# Patient Record
Sex: Female | Born: 1965 | Race: Black or African American | Hispanic: No | Marital: Single | Smoking: Never smoker
Health system: Southern US, Community
[De-identification: ages and names within clinical notes are randomized; demographics above are authoritative.]

## PROBLEM LIST (undated history)

## (undated) DIAGNOSIS — F419 Anxiety disorder, unspecified: Secondary | ICD-10-CM

## (undated) DIAGNOSIS — I10 Essential (primary) hypertension: Secondary | ICD-10-CM

## (undated) DIAGNOSIS — F32A Depression, unspecified: Secondary | ICD-10-CM

## (undated) DIAGNOSIS — E669 Obesity, unspecified: Secondary | ICD-10-CM

## (undated) DIAGNOSIS — D649 Anemia, unspecified: Secondary | ICD-10-CM

## (undated) DIAGNOSIS — Z923 Personal history of irradiation: Secondary | ICD-10-CM

## (undated) DIAGNOSIS — C541 Malignant neoplasm of endometrium: Secondary | ICD-10-CM

## (undated) DIAGNOSIS — E785 Hyperlipidemia, unspecified: Secondary | ICD-10-CM

## (undated) DIAGNOSIS — Q899 Congenital malformation, unspecified: Secondary | ICD-10-CM

## (undated) DIAGNOSIS — R609 Edema, unspecified: Secondary | ICD-10-CM

## (undated) DIAGNOSIS — M1990 Unspecified osteoarthritis, unspecified site: Secondary | ICD-10-CM

## (undated) DIAGNOSIS — F329 Major depressive disorder, single episode, unspecified: Secondary | ICD-10-CM

## (undated) DIAGNOSIS — Z9289 Personal history of other medical treatment: Secondary | ICD-10-CM

## (undated) HISTORY — DX: Personal history of irradiation: Z92.3

## (undated) HISTORY — DX: Congenital malformation, unspecified: Q89.9

## (undated) HISTORY — DX: Depression, unspecified: F32.A

## (undated) HISTORY — DX: Personal history of other medical treatment: Z92.89

## (undated) HISTORY — DX: Malignant neoplasm of endometrium: C54.1

## (undated) HISTORY — DX: Anemia, unspecified: D64.9

## (undated) HISTORY — PX: HIATAL HERNIA REPAIR: SHX195

## (undated) HISTORY — DX: Hyperlipidemia, unspecified: E78.5

## (undated) HISTORY — DX: Major depressive disorder, single episode, unspecified: F32.9

## (undated) HISTORY — DX: Obesity, unspecified: E66.9

---

## 1998-04-28 ENCOUNTER — Emergency Department (HOSPITAL_COMMUNITY): Admission: EM | Admit: 1998-04-28 | Discharge: 1998-04-28 | Payer: Self-pay | Admitting: Emergency Medicine

## 1998-10-05 ENCOUNTER — Ambulatory Visit (HOSPITAL_COMMUNITY): Admission: RE | Admit: 1998-10-05 | Discharge: 1998-10-05 | Payer: Self-pay | Admitting: *Deleted

## 1998-10-14 ENCOUNTER — Ambulatory Visit (HOSPITAL_COMMUNITY): Admission: RE | Admit: 1998-10-14 | Discharge: 1998-10-14 | Payer: Self-pay | Admitting: Nephrology

## 1998-10-14 ENCOUNTER — Encounter: Payer: Self-pay | Admitting: Emergency Medicine

## 1998-10-14 ENCOUNTER — Emergency Department (HOSPITAL_COMMUNITY): Admission: EM | Admit: 1998-10-14 | Discharge: 1998-10-14 | Payer: Self-pay | Admitting: Emergency Medicine

## 1998-10-14 ENCOUNTER — Encounter: Payer: Self-pay | Admitting: *Deleted

## 1998-11-28 ENCOUNTER — Emergency Department (HOSPITAL_COMMUNITY): Admission: EM | Admit: 1998-11-28 | Discharge: 1998-11-28 | Payer: Self-pay | Admitting: Emergency Medicine

## 1999-02-07 ENCOUNTER — Encounter: Admission: RE | Admit: 1999-02-07 | Discharge: 1999-02-07 | Payer: Self-pay | Admitting: Sports Medicine

## 1999-04-01 ENCOUNTER — Emergency Department (HOSPITAL_COMMUNITY): Admission: EM | Admit: 1999-04-01 | Discharge: 1999-04-01 | Payer: Self-pay | Admitting: Emergency Medicine

## 1999-06-16 ENCOUNTER — Encounter: Admission: RE | Admit: 1999-06-16 | Discharge: 1999-06-16 | Payer: Self-pay | Admitting: Family Medicine

## 1999-08-01 ENCOUNTER — Encounter: Admission: RE | Admit: 1999-08-01 | Discharge: 1999-08-01 | Payer: Self-pay | Admitting: Family Medicine

## 2000-10-29 ENCOUNTER — Emergency Department (HOSPITAL_COMMUNITY): Admission: EM | Admit: 2000-10-29 | Discharge: 2000-10-29 | Payer: Self-pay | Admitting: Internal Medicine

## 2001-01-29 ENCOUNTER — Encounter: Admission: RE | Admit: 2001-01-29 | Discharge: 2001-01-29 | Payer: Self-pay | Admitting: Family Medicine

## 2001-03-14 ENCOUNTER — Ambulatory Visit (HOSPITAL_COMMUNITY): Admission: RE | Admit: 2001-03-14 | Discharge: 2001-03-14 | Payer: Self-pay | Admitting: Sports Medicine

## 2001-08-10 ENCOUNTER — Emergency Department (HOSPITAL_COMMUNITY): Admission: EM | Admit: 2001-08-10 | Discharge: 2001-08-10 | Payer: Self-pay | Admitting: Emergency Medicine

## 2002-05-07 ENCOUNTER — Encounter: Admission: RE | Admit: 2002-05-07 | Discharge: 2002-05-07 | Payer: Self-pay | Admitting: Family Medicine

## 2002-05-08 ENCOUNTER — Emergency Department (HOSPITAL_COMMUNITY): Admission: EM | Admit: 2002-05-08 | Discharge: 2002-05-08 | Payer: Self-pay | Admitting: Emergency Medicine

## 2002-05-11 ENCOUNTER — Observation Stay (HOSPITAL_COMMUNITY): Admission: RE | Admit: 2002-05-11 | Discharge: 2002-05-12 | Payer: Self-pay | Admitting: General Surgery

## 2002-05-23 ENCOUNTER — Emergency Department (HOSPITAL_COMMUNITY): Admission: EM | Admit: 2002-05-23 | Discharge: 2002-05-24 | Payer: Self-pay | Admitting: Emergency Medicine

## 2002-05-27 ENCOUNTER — Encounter: Admission: RE | Admit: 2002-05-27 | Discharge: 2002-05-27 | Payer: Self-pay | Admitting: Family Medicine

## 2003-01-04 ENCOUNTER — Emergency Department (HOSPITAL_COMMUNITY): Admission: EM | Admit: 2003-01-04 | Discharge: 2003-01-04 | Payer: Self-pay | Admitting: Emergency Medicine

## 2003-05-06 ENCOUNTER — Encounter: Admission: RE | Admit: 2003-05-06 | Discharge: 2003-05-06 | Payer: Self-pay | Admitting: Family Medicine

## 2003-05-27 ENCOUNTER — Encounter: Admission: RE | Admit: 2003-05-27 | Discharge: 2003-05-27 | Payer: Self-pay | Admitting: Family Medicine

## 2003-06-10 ENCOUNTER — Ambulatory Visit (HOSPITAL_COMMUNITY): Admission: RE | Admit: 2003-06-10 | Discharge: 2003-06-10 | Payer: Self-pay | Admitting: Orthopedic Surgery

## 2003-06-10 ENCOUNTER — Encounter: Payer: Self-pay | Admitting: Orthopedic Surgery

## 2003-09-24 ENCOUNTER — Encounter: Admission: RE | Admit: 2003-09-24 | Discharge: 2003-09-24 | Payer: Self-pay | Admitting: Family Medicine

## 2004-08-22 ENCOUNTER — Ambulatory Visit: Payer: Self-pay | Admitting: Sports Medicine

## 2004-08-25 ENCOUNTER — Encounter: Admission: RE | Admit: 2004-08-25 | Discharge: 2004-08-25 | Payer: Self-pay | Admitting: Sports Medicine

## 2004-10-24 ENCOUNTER — Ambulatory Visit: Payer: Self-pay | Admitting: Sports Medicine

## 2005-03-27 ENCOUNTER — Ambulatory Visit: Payer: Self-pay | Admitting: Family Medicine

## 2005-05-08 ENCOUNTER — Ambulatory Visit: Payer: Self-pay | Admitting: Sports Medicine

## 2006-02-04 ENCOUNTER — Ambulatory Visit: Payer: Self-pay | Admitting: Family Medicine

## 2006-05-07 IMAGING — US US TRANSVAGINAL NON-OB
1 series · 14 of 25 positions shown · non-contrast
Comparison: none

CLINICAL DATA: Pelvic pain.
 ULTRASOUND OF THE PELVIS
 Transabdominal and Transabdominal and transvaginal ultrasound of the pelvis were performed.  The uterus is normal in size measuring   8.4 cm sagittally with a depth of 3.1 cm and width of 6.1 cm.  There are a few nabothian cysts present.  The endometrium is prominent and inhomogeneous measuring approximately 15.8 mm in thickness.  Follow up ultrasound is recommended in two months, and if this inhomogeneity and thickening persist, then sonohysterogram or endometrial biopsy would be recommended.  The right ovary is normal in size with follicles.  The left ovary is obscured by bowel gas and cannot be assessed.  No free fluid is seen. 
 IMPRESSION
 1.  Thickened and inhomogeneous endometrium.  Suggest follow up ultrasound in 1-2 months, as noted above.
 2.  Right ovarian follicles.  Left ovary is obscured by bowel gas.

[Series 1: unknown · 0.26mm/px · 14 of 52 slices shown]
[im 1/52]
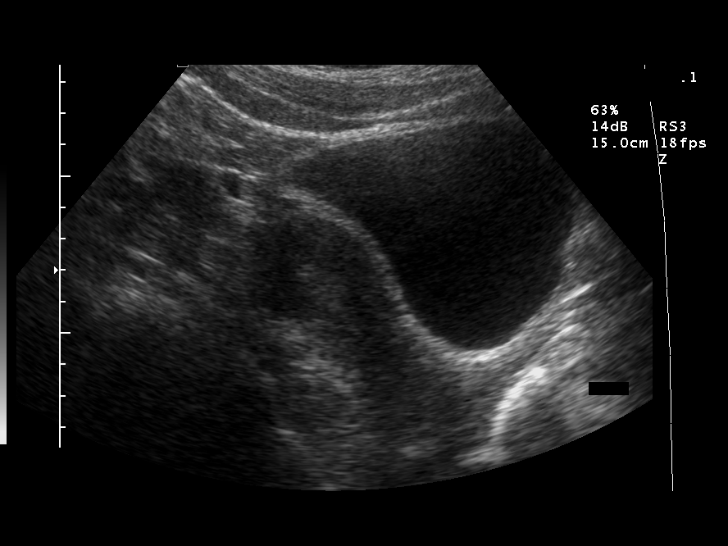
[im 5/52]
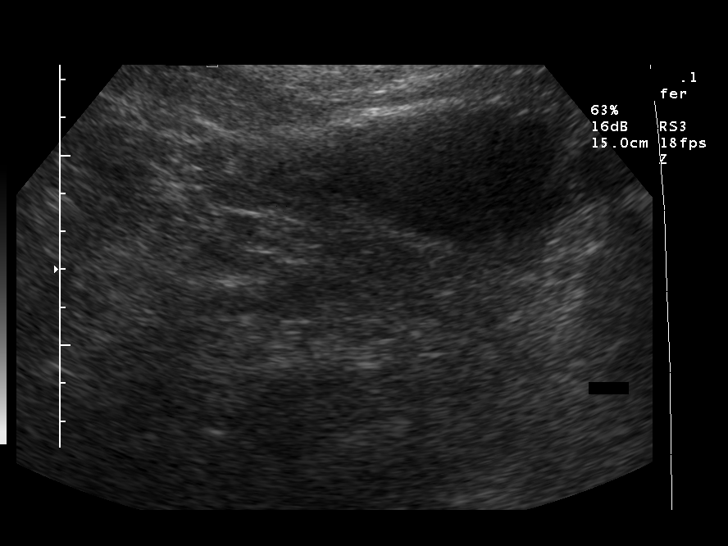
[im 9/52]
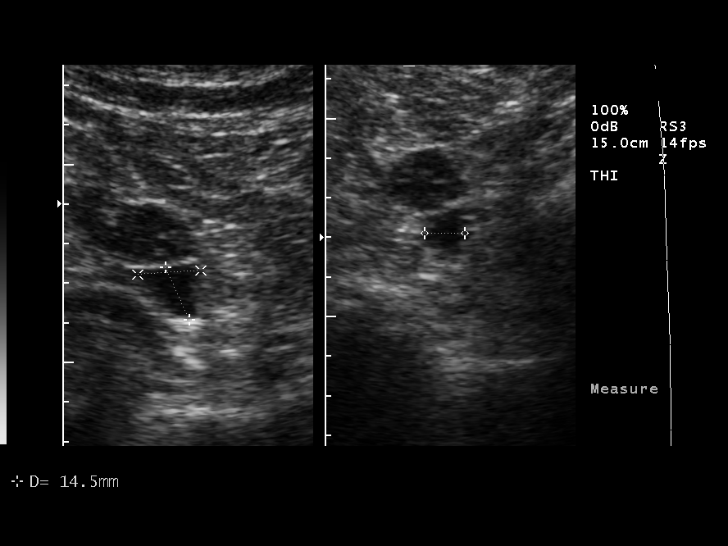
[im 13/52]
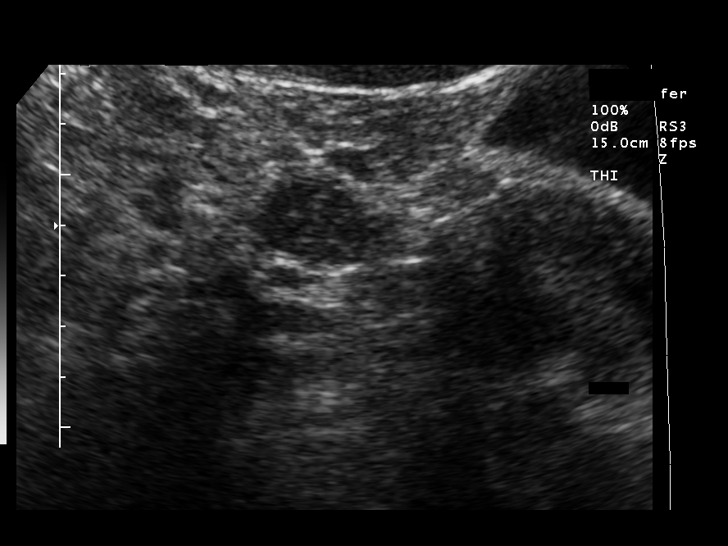
[im 18/52]
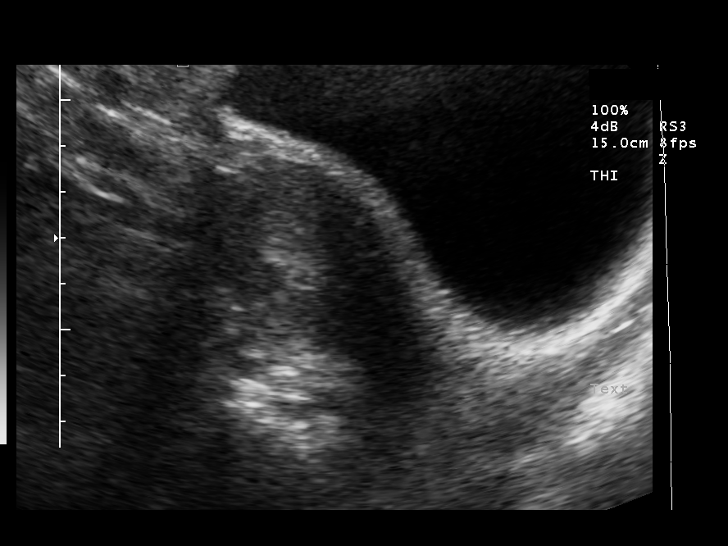
[im 20/52]
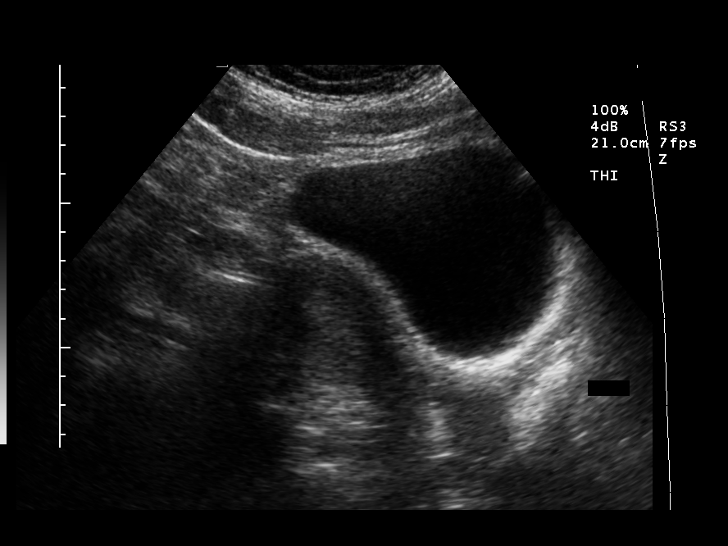
[im 24/52]
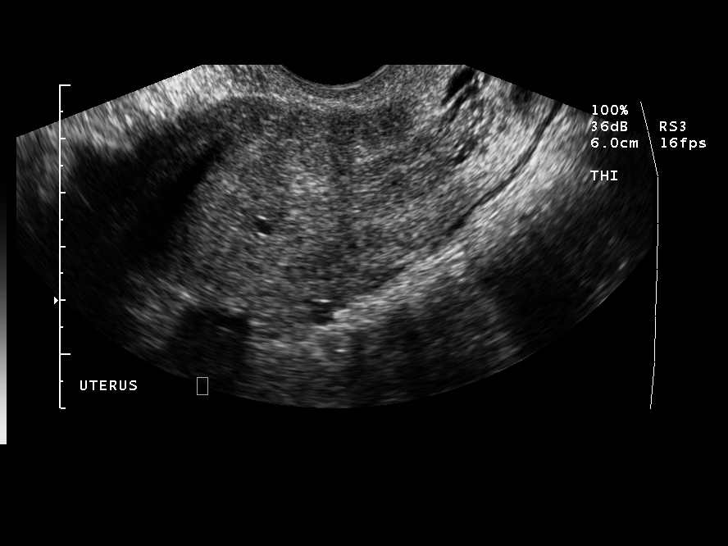
[im 28/52]
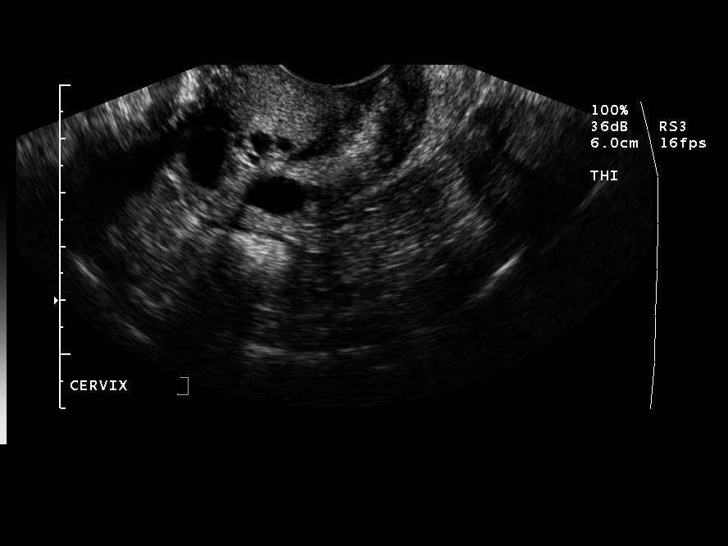
[im 32/52]
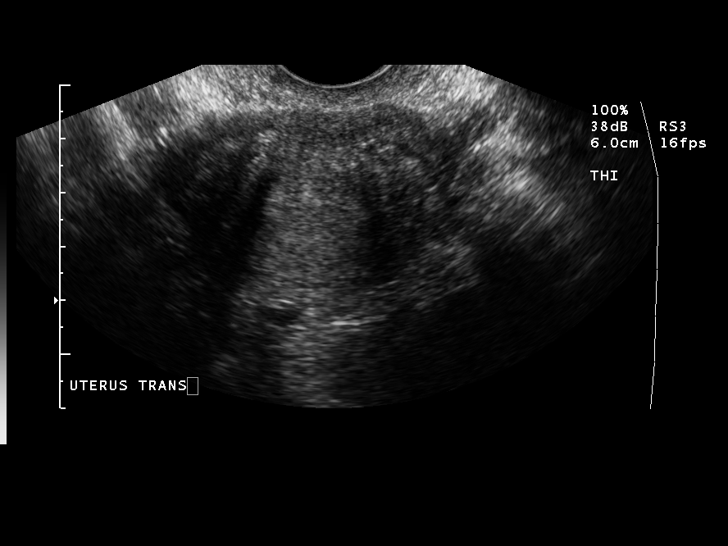
[im 35/52]
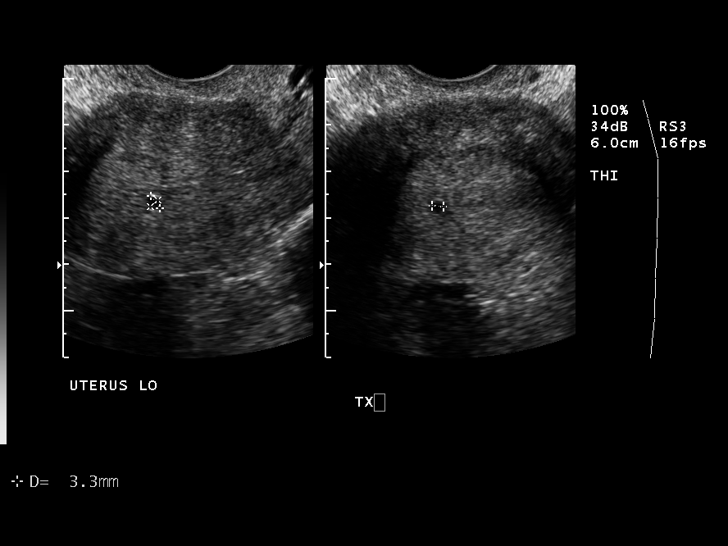
[im 39/52]
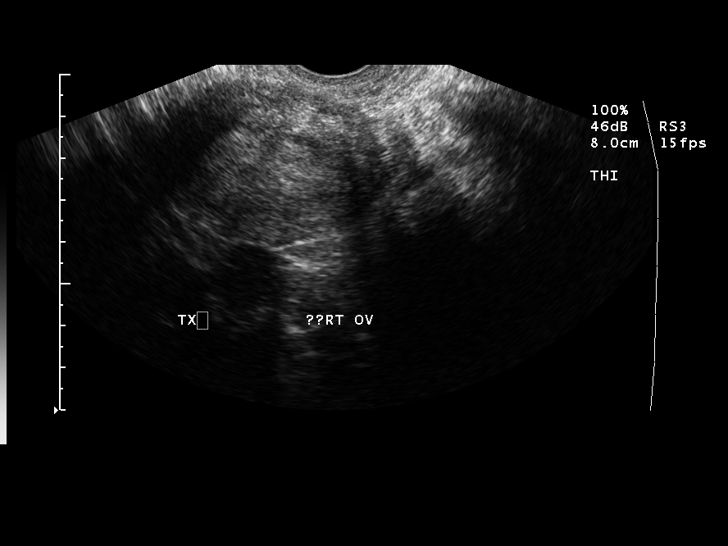
[im 43/52]
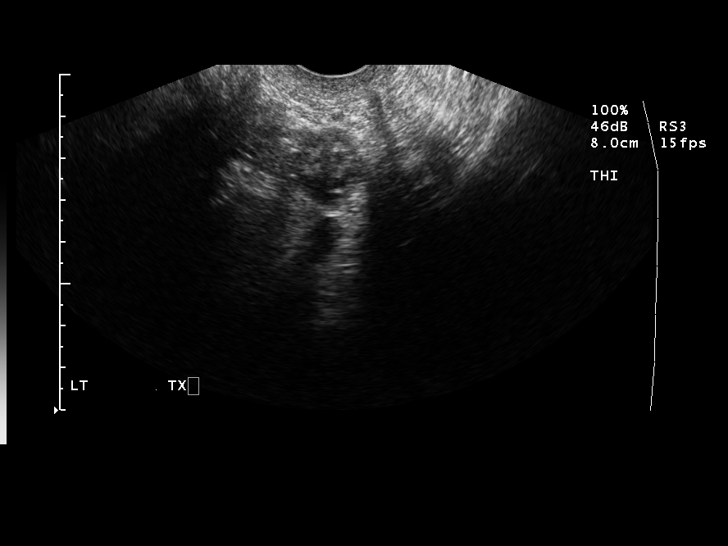
[im 47/52]
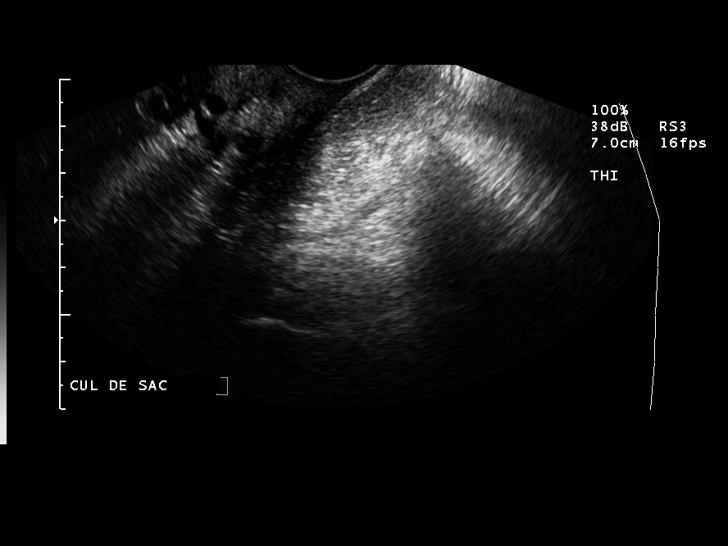
[im 52/52]
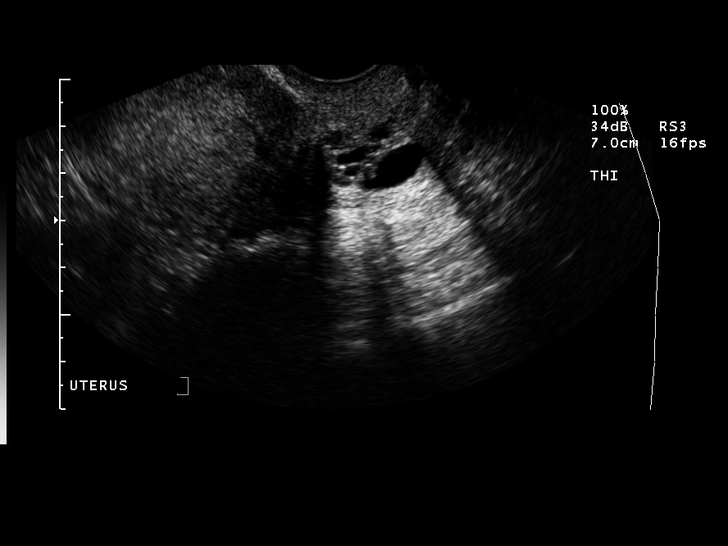

[14 of 25 positions shown; findings below may reference images not displayed]

## 2007-01-23 DIAGNOSIS — K219 Gastro-esophageal reflux disease without esophagitis: Secondary | ICD-10-CM | POA: Insufficient documentation

## 2007-01-23 DIAGNOSIS — M170 Bilateral primary osteoarthritis of knee: Secondary | ICD-10-CM

## 2007-01-23 DIAGNOSIS — M25562 Pain in left knee: Secondary | ICD-10-CM

## 2007-01-23 DIAGNOSIS — E669 Obesity, unspecified: Secondary | ICD-10-CM | POA: Insufficient documentation

## 2007-01-23 HISTORY — DX: Gastro-esophageal reflux disease without esophagitis: K21.9

## 2007-05-22 ENCOUNTER — Encounter (INDEPENDENT_AMBULATORY_CARE_PROVIDER_SITE_OTHER): Payer: Self-pay | Admitting: *Deleted

## 2007-05-28 ENCOUNTER — Telehealth (INDEPENDENT_AMBULATORY_CARE_PROVIDER_SITE_OTHER): Payer: Self-pay | Admitting: Family Medicine

## 2007-06-13 ENCOUNTER — Ambulatory Visit: Payer: Self-pay | Admitting: Family Medicine

## 2007-06-13 DIAGNOSIS — M545 Low back pain: Secondary | ICD-10-CM

## 2007-06-13 DIAGNOSIS — D500 Iron deficiency anemia secondary to blood loss (chronic): Secondary | ICD-10-CM | POA: Insufficient documentation

## 2007-06-25 ENCOUNTER — Telehealth: Payer: Self-pay | Admitting: *Deleted

## 2007-07-08 ENCOUNTER — Encounter: Payer: Self-pay | Admitting: Family Medicine

## 2007-07-08 ENCOUNTER — Ambulatory Visit: Payer: Self-pay | Admitting: Family Medicine

## 2007-07-08 ENCOUNTER — Telehealth (INDEPENDENT_AMBULATORY_CARE_PROVIDER_SITE_OTHER): Payer: Self-pay | Admitting: *Deleted

## 2007-07-08 DIAGNOSIS — H905 Unspecified sensorineural hearing loss: Secondary | ICD-10-CM

## 2007-07-09 ENCOUNTER — Encounter (INDEPENDENT_AMBULATORY_CARE_PROVIDER_SITE_OTHER): Payer: Self-pay | Admitting: Family Medicine

## 2007-07-16 ENCOUNTER — Ambulatory Visit: Payer: Self-pay | Admitting: Family Medicine

## 2007-07-18 ENCOUNTER — Ambulatory Visit: Payer: Self-pay | Admitting: Family Medicine

## 2007-09-02 ENCOUNTER — Telehealth (INDEPENDENT_AMBULATORY_CARE_PROVIDER_SITE_OTHER): Payer: Self-pay | Admitting: *Deleted

## 2007-10-22 ENCOUNTER — Telehealth (INDEPENDENT_AMBULATORY_CARE_PROVIDER_SITE_OTHER): Payer: Self-pay | Admitting: Family Medicine

## 2007-10-23 ENCOUNTER — Encounter (INDEPENDENT_AMBULATORY_CARE_PROVIDER_SITE_OTHER): Payer: Self-pay | Admitting: Family Medicine

## 2007-11-28 ENCOUNTER — Telehealth: Payer: Self-pay | Admitting: *Deleted

## 2007-12-22 ENCOUNTER — Emergency Department (HOSPITAL_COMMUNITY): Admission: EM | Admit: 2007-12-22 | Discharge: 2007-12-22 | Payer: Self-pay | Admitting: Emergency Medicine

## 2007-12-26 ENCOUNTER — Encounter: Admission: RE | Admit: 2007-12-26 | Discharge: 2007-12-26 | Payer: Self-pay | Admitting: Orthopedic Surgery

## 2008-02-13 ENCOUNTER — Telehealth: Payer: Self-pay | Admitting: *Deleted

## 2008-07-02 ENCOUNTER — Telehealth: Payer: Self-pay | Admitting: *Deleted

## 2009-07-28 ENCOUNTER — Emergency Department (HOSPITAL_COMMUNITY): Admission: EM | Admit: 2009-07-28 | Discharge: 2009-07-28 | Payer: Self-pay | Admitting: Emergency Medicine

## 2009-08-18 ENCOUNTER — Encounter: Payer: Self-pay | Admitting: Family Medicine

## 2009-08-18 ENCOUNTER — Ambulatory Visit: Payer: Self-pay | Admitting: Family Medicine

## 2009-08-18 DIAGNOSIS — L538 Other specified erythematous conditions: Secondary | ICD-10-CM | POA: Insufficient documentation

## 2009-08-18 LAB — CONVERTED CEMR LAB
AST: 10 units/L (ref 0–37)
Albumin: 3.7 g/dL (ref 3.5–5.2)
BUN: 14 mg/dL (ref 6–23)
Calcium: 8.5 mg/dL (ref 8.4–10.5)
Chloride: 104 meq/L (ref 96–112)
Glucose, Bld: 79 mg/dL (ref 70–99)
HDL: 50 mg/dL (ref >39)
Hemoglobin: 12.4 g/dL (ref 12.0–15.0)
Potassium: 4.2 meq/L (ref 3.5–5.3)
RBC: 5.07 M/uL (ref 3.87–5.11)
Saturation Ratios: 7 % — ABNORMAL LOW (ref 20–55)
TIBC: 433 ug/dL (ref 250–470)
TSH: 0.28 microintl units/mL — ABNORMAL LOW (ref 0.350–4.500)
WBC: 5.9 10*3/uL (ref 4.0–10.5)

## 2009-08-22 ENCOUNTER — Encounter: Payer: Self-pay | Admitting: Family Medicine

## 2009-08-26 ENCOUNTER — Telehealth: Payer: Self-pay | Admitting: Family Medicine

## 2009-08-29 ENCOUNTER — Telehealth (INDEPENDENT_AMBULATORY_CARE_PROVIDER_SITE_OTHER): Payer: Self-pay

## 2009-08-30 ENCOUNTER — Telehealth: Payer: Self-pay | Admitting: *Deleted

## 2009-09-02 IMAGING — CR DG KNEE COMPLETE 4+V*L*
4 series · 4 of 4 positions shown · non-contrast
Comparison: None.

CLINICAL DATA: Pain below the patella ? no known injury.
 LEFT KNEE ? 4 VIEWS ? 12/22/07:

[t knee ap left]
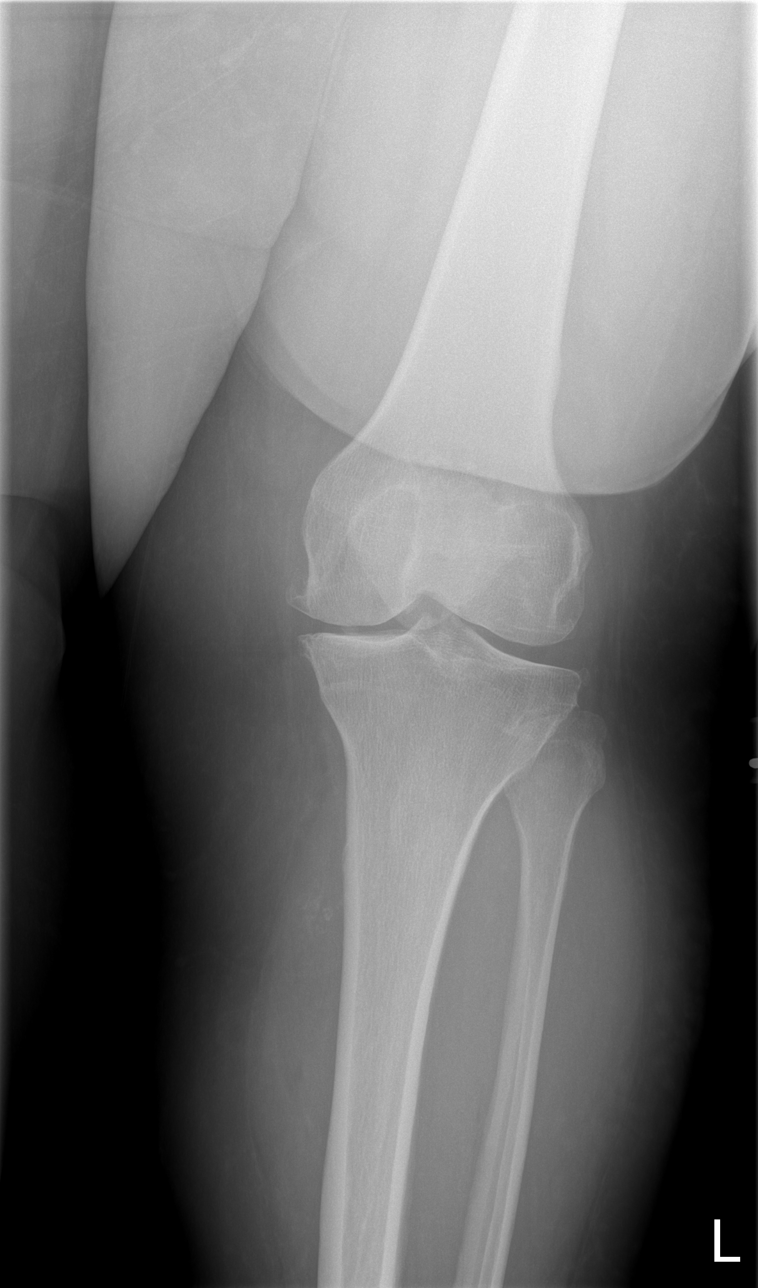

[t knee oblique left (1 of 2)]
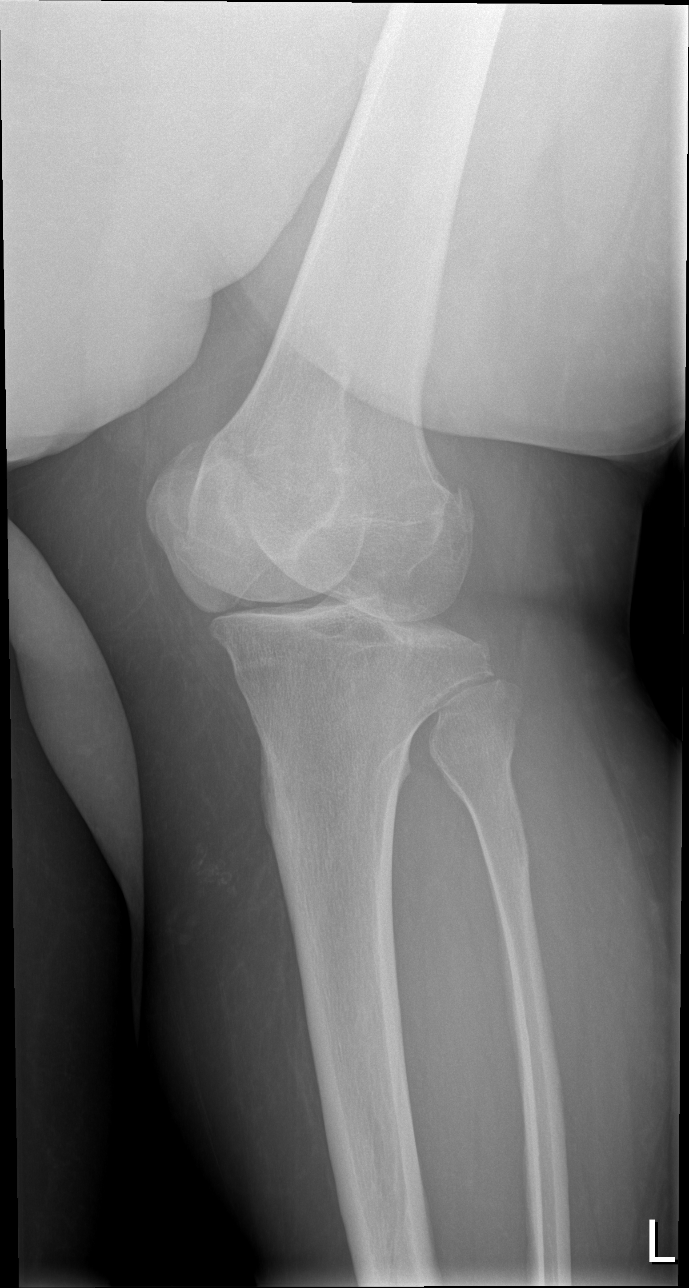

[t knee oblique left (2 of 2)]
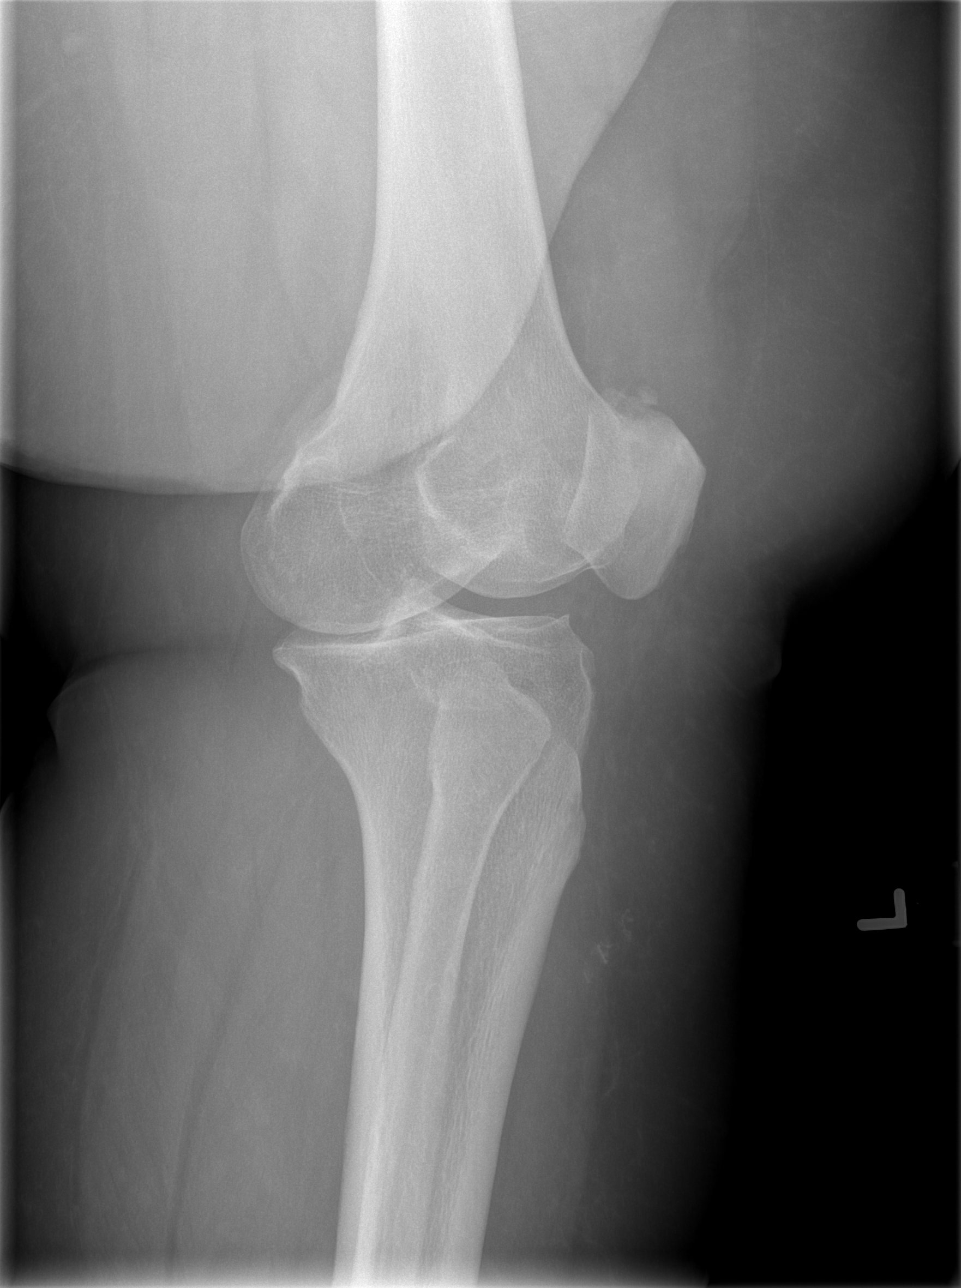

[t knee lat left]
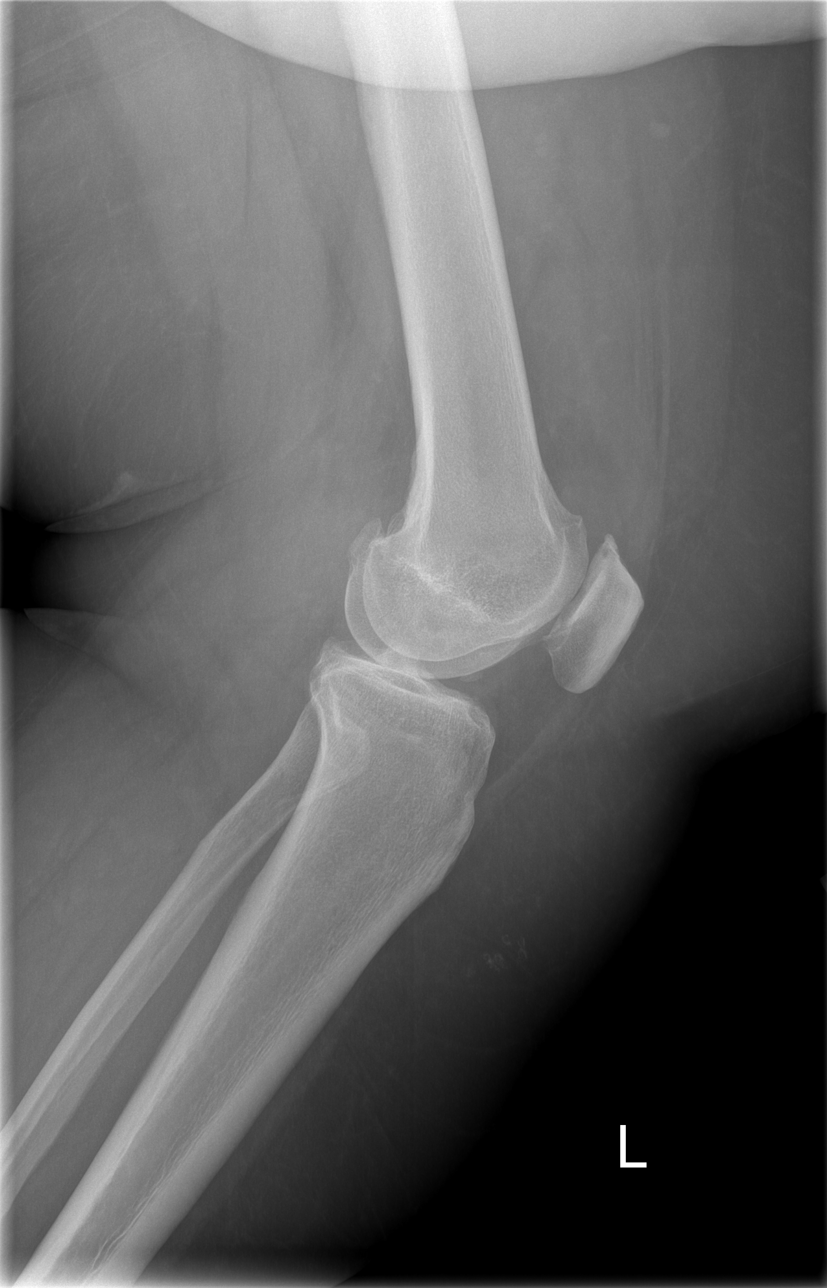

[4 of 4 positions shown; findings below may reference images not displayed]

FINDINGS: There are moderately advanced degenerative changes of the medial compartment and moderate degenerative changes of the patellofemoral compartment.  There is a possible small joint effusion but no acute findings of the bony structures.
IMPRESSION: Moderately advanced degenerative changes with possible small joint effusion ? no fracture or dislocation.

## 2009-09-02 IMAGING — CR DG FINGER THUMB 2+V*L*
3 series · 3 of 3 positions shown · non-contrast
Comparison: none

CLINICAL DATA: Left thumb pain. 
 LEFT THUMB ? 3 VIEW:

[x finger pa left]
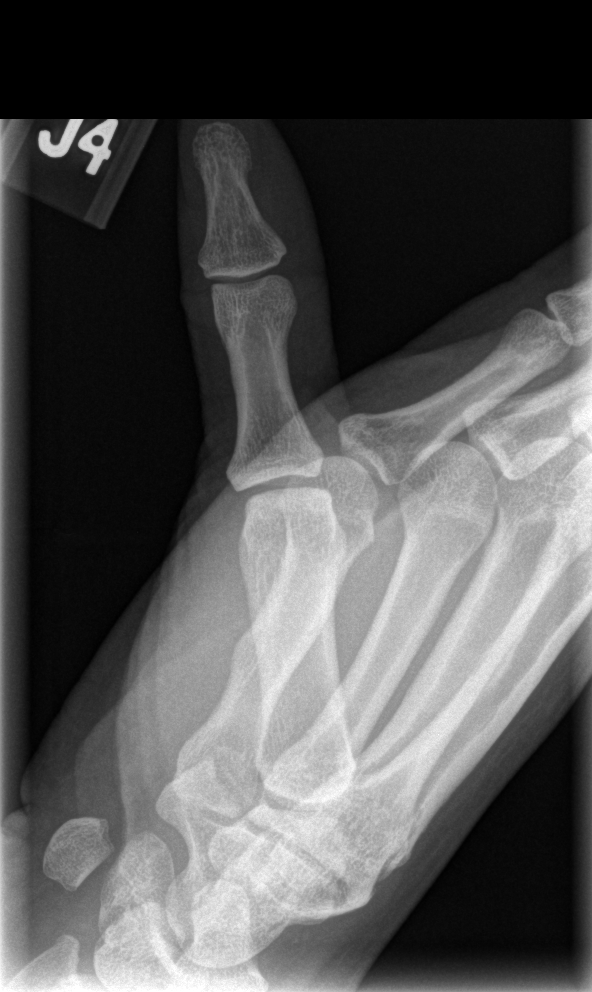

[x finger obl. left]
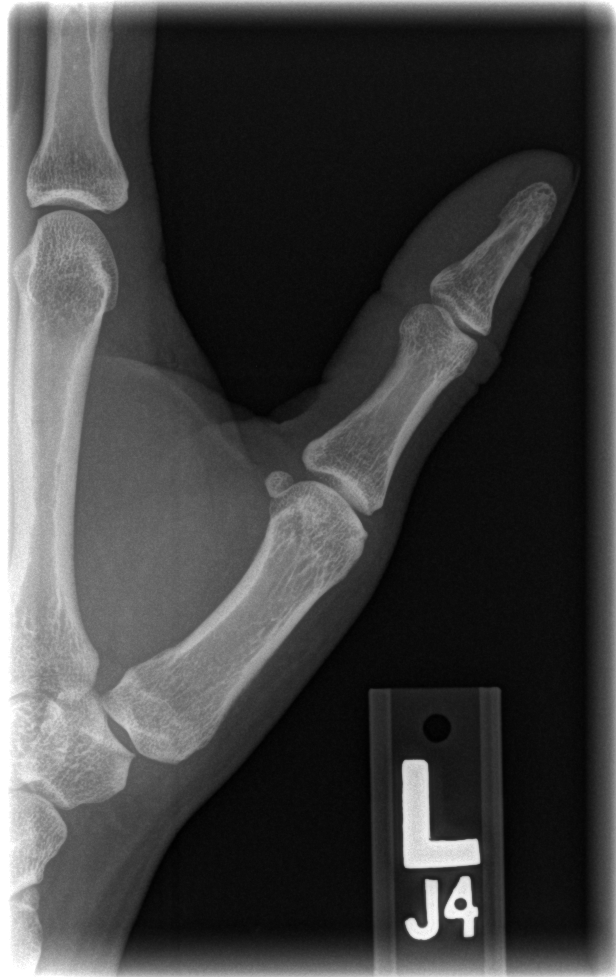

[x finger lateral left]
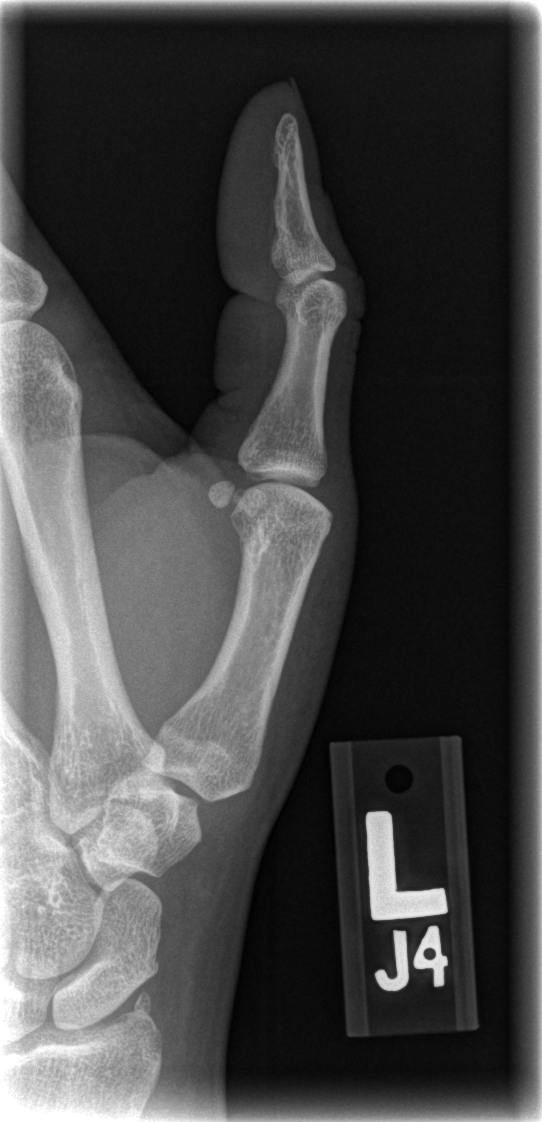

[3 of 3 positions shown; findings below may reference images not displayed]

FINDINGS: There is no evidence of fracture or dislocation.  There is no evidence of arthropathy or other focal bone abnormality.  Soft tissues are unremarkable.
IMPRESSION: Negative.

## 2009-09-06 ENCOUNTER — Telehealth (INDEPENDENT_AMBULATORY_CARE_PROVIDER_SITE_OTHER): Payer: Self-pay

## 2009-09-06 IMAGING — MR MR [PERSON_NAME] LOW JT W/O CM*L*
4 of 7 series · 19 of 40 positions shown · non-contrast
Comparison: None

CLINICAL DATA: Knee pain, tear.
MRI OF THE LEFT KNEE WITHOUT CONTRAST
TECHNIQUE: Multiplanar, multisequence MR imaging of the LEFT knee was performed
following the standard protocol.  No intravenous contrast was administered.

[Series 2: loc 3 plane · coronal · 8.0mm · 0.68mm/px · 3 of 6 slices shown]
[im 1/6]
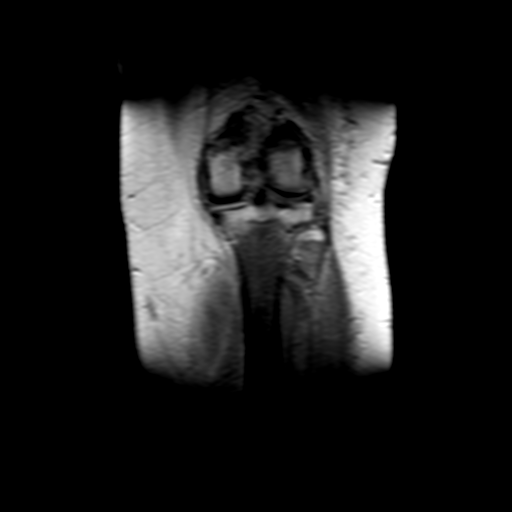
[im 3/6]
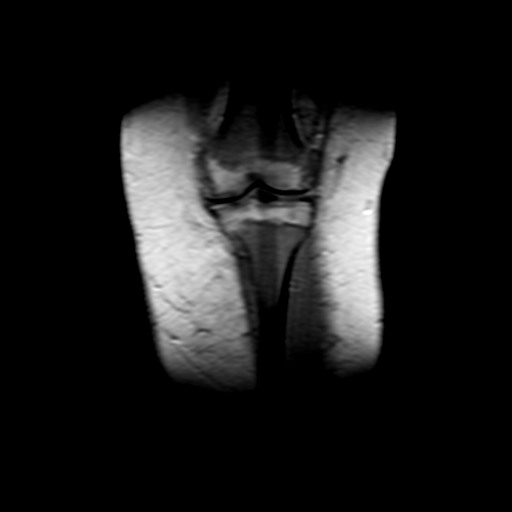
[im 6/6]
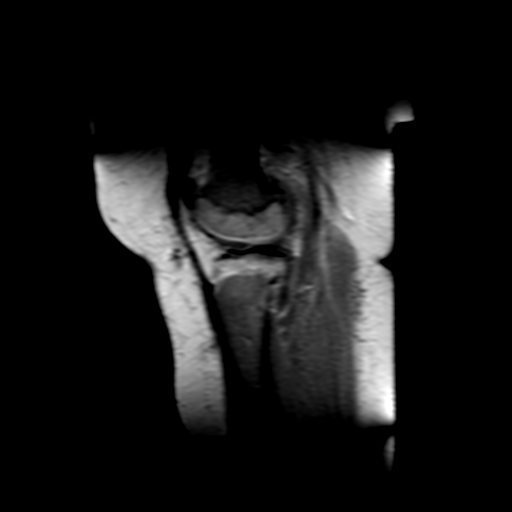

[Series 3: left pd_tse_fs_tra · axial · 4.0mm · 0.37mm/px · z∈[-7,+90]mm · 7 of 23 slices shown]
[im 1/23]
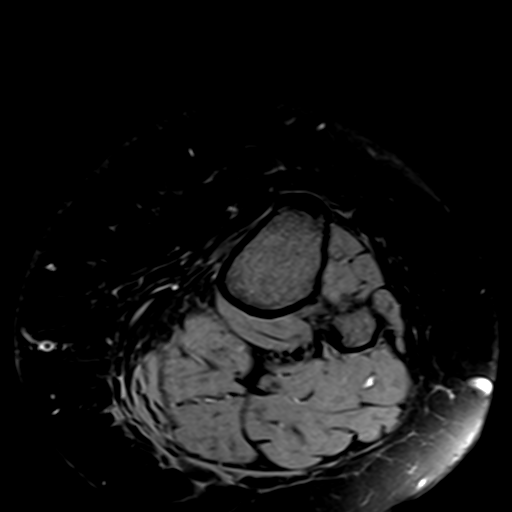
[im 4/23]
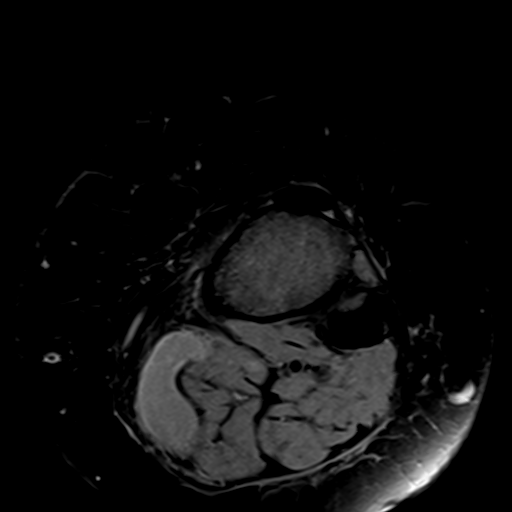
[im 8/23]
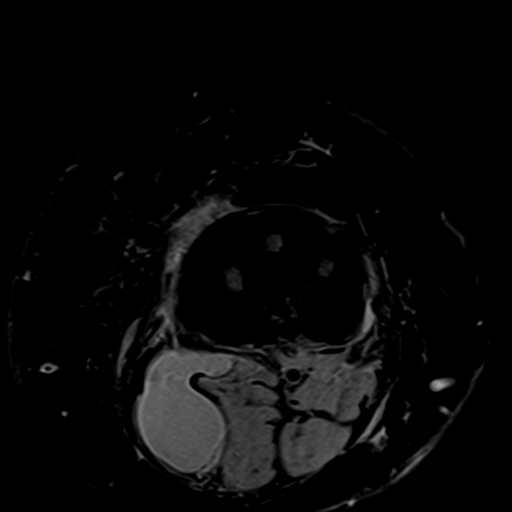
[im 12/23]
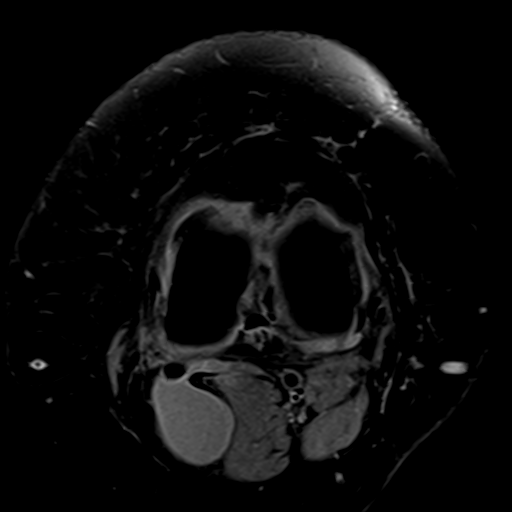
[im 15/23]
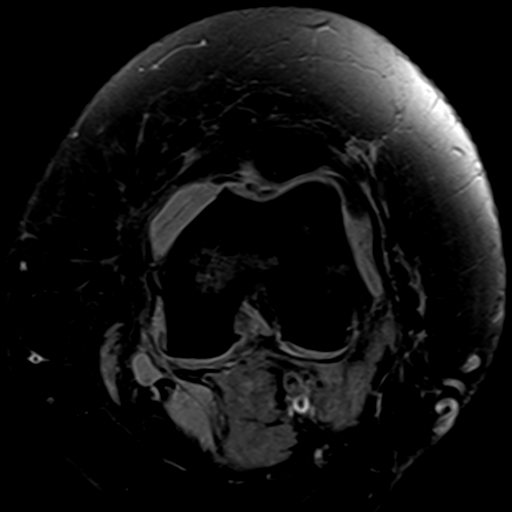
[im 19/23]
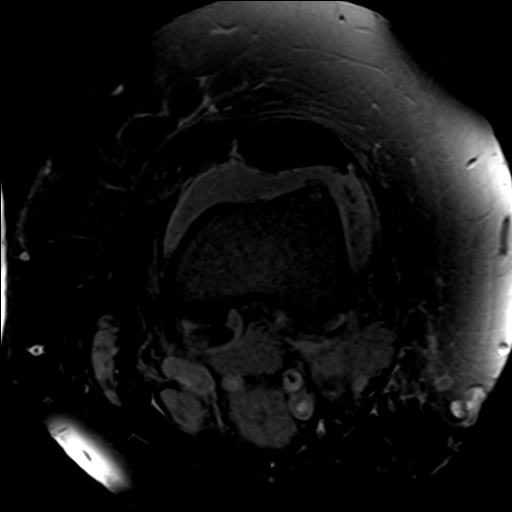
[im 23/23]
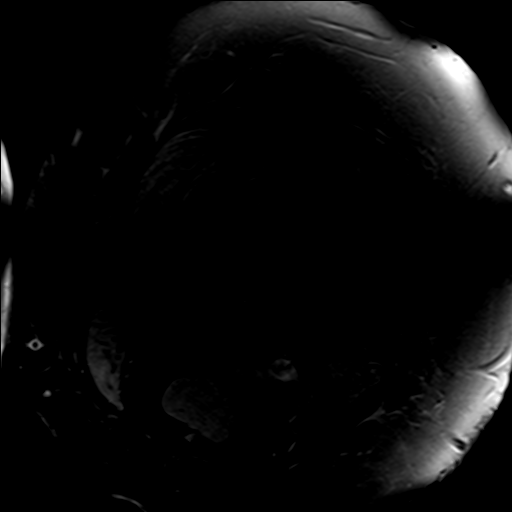

[Series 4: left pd_tse_fs_cor · coronal · 4.0mm · 0.37mm/px · 6 of 23 slices shown]
[im 1/23]
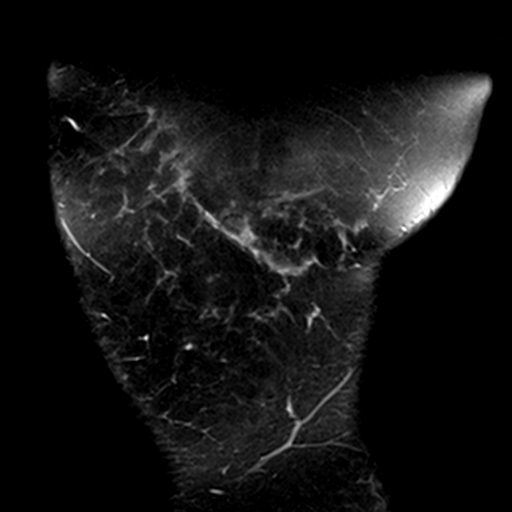
[im 5/23]
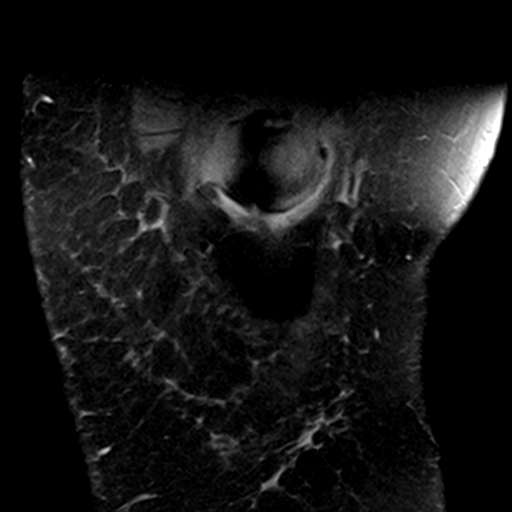
[im 9/23]
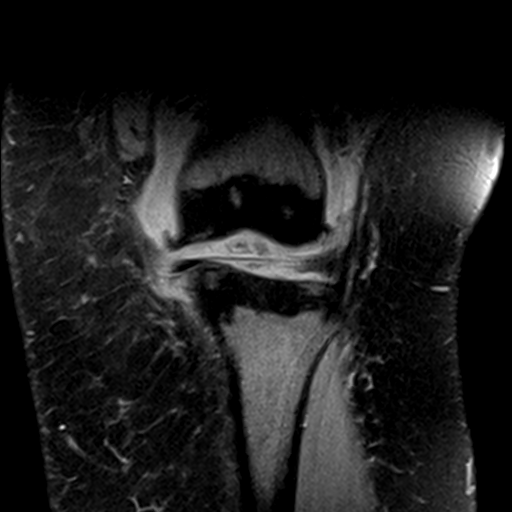
[im 14/23]
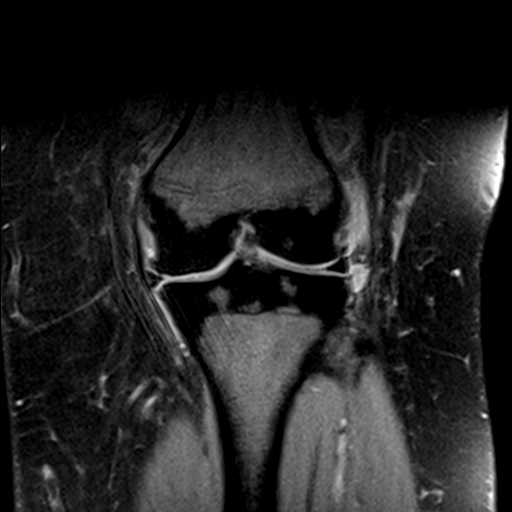
[im 18/23]
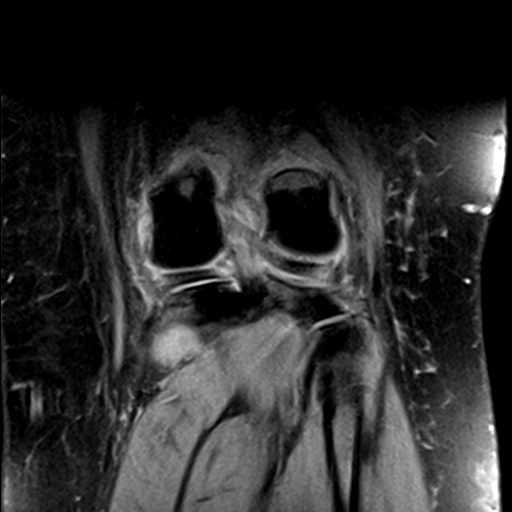
[im 23/23]
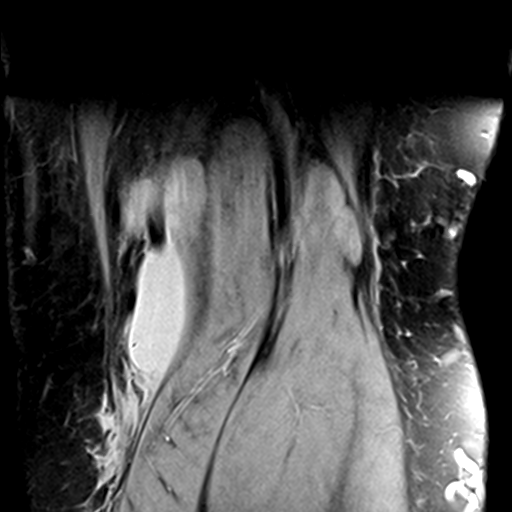

[Series 5: T2 fat-sat · coronal · 4.0mm · 0.37mm/px · 3 of 23 slices shown]
[im 5/23]
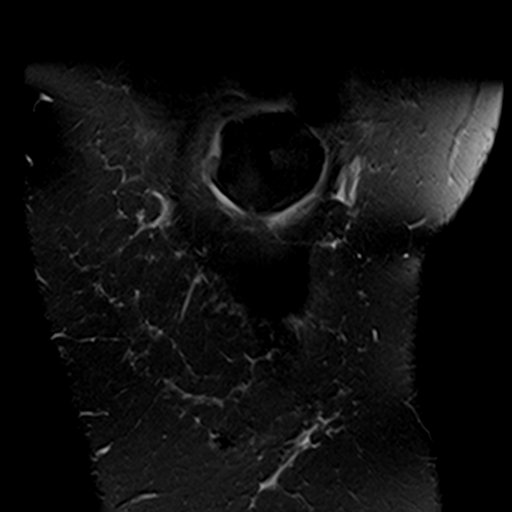
[im 14/23]
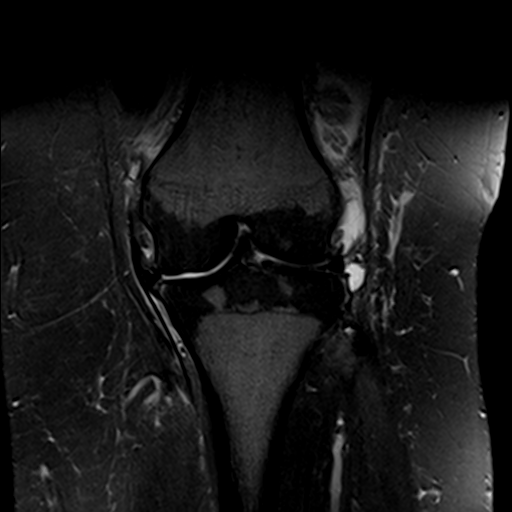
[im 23/23]
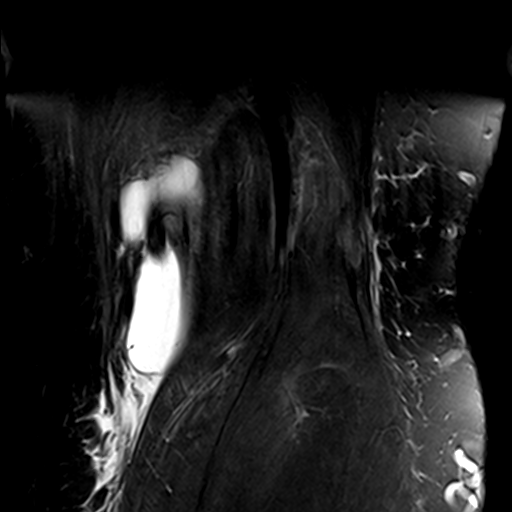

[19 of 40 positions shown; findings below may reference images not displayed]

FINDINGS: . Bones/bone marrow: Symmetric active marrow, greater than expected for age.
This is a nonspecific finding, usually associated with chronic disease,
medications, cigarette smoking, or anemia. .  No fracture or osteonecrosis. Age
advanced medial and lateral compartment osteoarthritis, with large marginal
osteophytes. . 
. Joint: Anatomic alignment .   Large effusion. Synovitis of the suprapatellar
recess. . 
. Extensor mechanism/Patellar Retinaculum:  Intact .
. Soft tissues:  Large dissecting Baker's cyst, with fluid tracking along the
medial head of gastrocnemius. .
. Articular cartilage: Severe chondromalacia patella, worst in the medial
compartment, with full thickness loss. Subchondral cystic change. Nearly
completely denuded cartilage in the medial femoral condyle, with marked lateral
compartment cartilaginous irregularity. .

. Cruciate ligaments: Intact.
. Collateral ligaments: Intact.
. Medial meniscus: Extruded, without tear. .
. Lateral meniscus: Mild fraying of the free edge. .
. Additional Comments: None.

IMPRESSION
1. Severe chondromalacia patella.
2. Moderately severe medial and lateral compartment osteoarthritis.
3. Effusion and synovitis, likely degenerative. Inflammatory arthritis felt less
likely.
4. Large dissecting Baker's cyst.
5. Markedly prominent active marrow, usually associated with chronic disease or
medications. .

## 2009-09-07 ENCOUNTER — Encounter (HOSPITAL_COMMUNITY): Admission: RE | Admit: 2009-09-07 | Discharge: 2009-09-07 | Payer: Self-pay | Admitting: Cardiology

## 2009-09-07 ENCOUNTER — Ambulatory Visit: Payer: Self-pay

## 2009-09-07 ENCOUNTER — Ambulatory Visit: Payer: Self-pay | Admitting: Cardiology

## 2009-09-12 ENCOUNTER — Telehealth: Payer: Self-pay | Admitting: Family Medicine

## 2009-09-15 ENCOUNTER — Ambulatory Visit: Payer: Self-pay

## 2009-09-19 ENCOUNTER — Encounter: Payer: Self-pay | Admitting: Cardiology

## 2009-10-03 ENCOUNTER — Telehealth: Payer: Self-pay | Admitting: *Deleted

## 2009-10-05 ENCOUNTER — Telehealth: Payer: Self-pay | Admitting: Family Medicine

## 2009-10-05 ENCOUNTER — Telehealth (INDEPENDENT_AMBULATORY_CARE_PROVIDER_SITE_OTHER): Payer: Self-pay | Admitting: *Deleted

## 2009-10-11 ENCOUNTER — Ambulatory Visit: Payer: Self-pay | Admitting: Cardiology

## 2009-10-11 ENCOUNTER — Encounter (INDEPENDENT_AMBULATORY_CARE_PROVIDER_SITE_OTHER): Payer: Self-pay | Admitting: *Deleted

## 2009-10-11 DIAGNOSIS — R9430 Abnormal result of cardiovascular function study, unspecified: Secondary | ICD-10-CM | POA: Insufficient documentation

## 2009-10-13 ENCOUNTER — Encounter: Payer: Self-pay | Admitting: Cardiology

## 2009-10-17 ENCOUNTER — Telehealth: Payer: Self-pay | Admitting: Cardiology

## 2009-10-24 ENCOUNTER — Telehealth: Payer: Self-pay | Admitting: *Deleted

## 2009-11-08 ENCOUNTER — Telehealth: Payer: Self-pay | Admitting: Cardiology

## 2010-01-24 ENCOUNTER — Telehealth: Payer: Self-pay | Admitting: Cardiology

## 2010-01-25 ENCOUNTER — Telehealth: Payer: Self-pay | Admitting: Cardiology

## 2010-02-01 ENCOUNTER — Encounter: Payer: Self-pay | Admitting: Family Medicine

## 2010-11-21 ENCOUNTER — Encounter: Payer: Self-pay | Admitting: Family Medicine

## 2010-12-17 ENCOUNTER — Encounter: Payer: Self-pay | Admitting: Sports Medicine

## 2010-12-28 NOTE — Miscellaneous (Signed)
Summary: no dental appt  Clinical Lists Changes rec'd call from [facility]. they are not taking non-urgent appts. the referral had down cleaning as reason for referral. called pt to tell her but had to leave a message.Golden Circle RN  February 01, 2010 10:50 AM  told her the above.Golden Circle RN  February 01, 2010 11:07 AM

## 2010-12-28 NOTE — Progress Notes (Signed)
Summary: pt want to talk to you  Phone Note Call from Patient Call back at [phone]   Caller: Patient Summary of Call: pt needs to talk to yuou Initial call taken by: Omer Jack,  January 24, 2010 1:33 PM  Follow-up for Phone Call        I called and spoke with the pt. She states she still wants to proceed with a heart cath. She will be calling back next week to schedule. Follow-up by: Sherri Rad, RN, BSN,  January 24, 2010 1:45 PM

## 2010-12-28 NOTE — Miscellaneous (Signed)
  Clinical Lists Changes  Problems: Removed problem of CHEST PAIN (ICD-786.50) Removed problem of HEALTH MAINTENANCE EXAM (ICD-V70.0) Removed problem of BACK PAIN, LUMBAR (ICD-724.2) Removed problem of TENSION HEADACHE (ICD-307.81) Removed problem of PALPITATIONS (ICD-785.1) Removed problem of MENORRHAGIA (ICD-626.2) Removed problem of MENSTRUAL CYCLE, IRREGULAR (ICD-626.4)

## 2010-12-28 NOTE — Progress Notes (Signed)
Summary: cath  ---- Converted from flag ---- ---- 01/24/2010 7:50 PM, Lenoria Farrier, MD, [facility] wrote: Yes, go ahead and reschedule and have pa see. BB  ---- 01/24/2010 1:49 PM, Sherri Rad, RN, BSN wrote: I just wanted to let you know. This pt was scheduled for a main lab cath on 11/23, but cancelled due to sickness. I called her back to try and r/s around early december, but she still stated that she was ill and would call back to r/s cath. She called today to state she would be calling me back next week to get a date for her heart cath. She was a main lab cath due to Medicaid. I just wanted to make sure you want me to go ahead and schedule this when the pt calls back. I can order a PA workup to update her H & P and get labs at the hospital if this is ok. ------------------------------

## 2010-12-29 ENCOUNTER — Encounter: Payer: Self-pay | Admitting: Family Medicine

## 2011-01-03 ENCOUNTER — Encounter: Payer: Self-pay | Admitting: Family Medicine

## 2011-04-09 IMAGING — CR DG ANKLE COMPLETE 3+V*R*
3 series · 3 of 3 positions shown · non-contrast
Comparison: None available.

CLINICAL DATA: Fall.  Right ankle pain and swelling.

RIGHT ANKLE - COMPLETE 3+ VIEW

[t ankle joint ap right *]
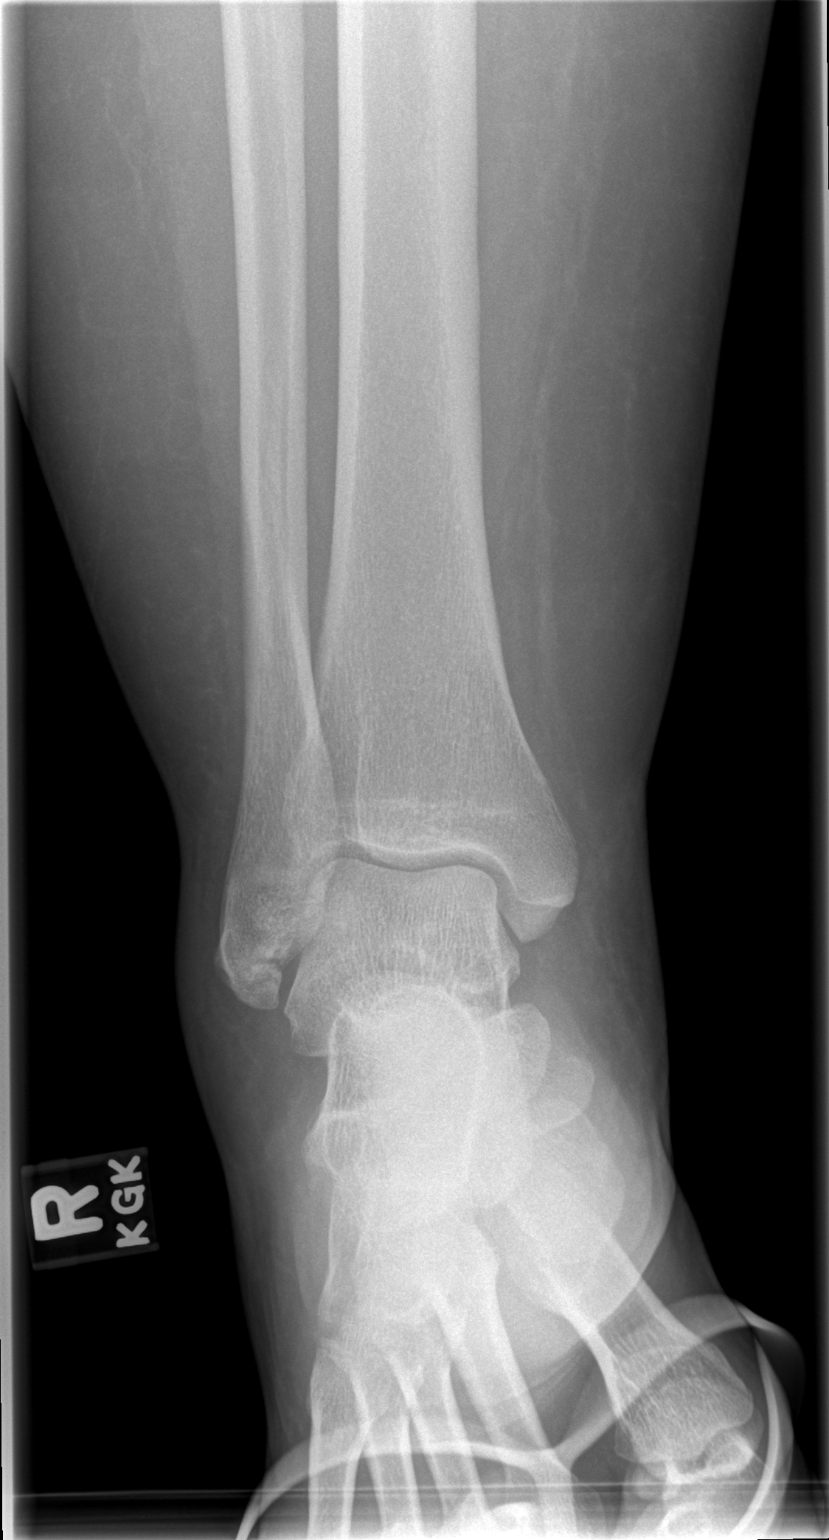

[t ankle joint oblique right *]
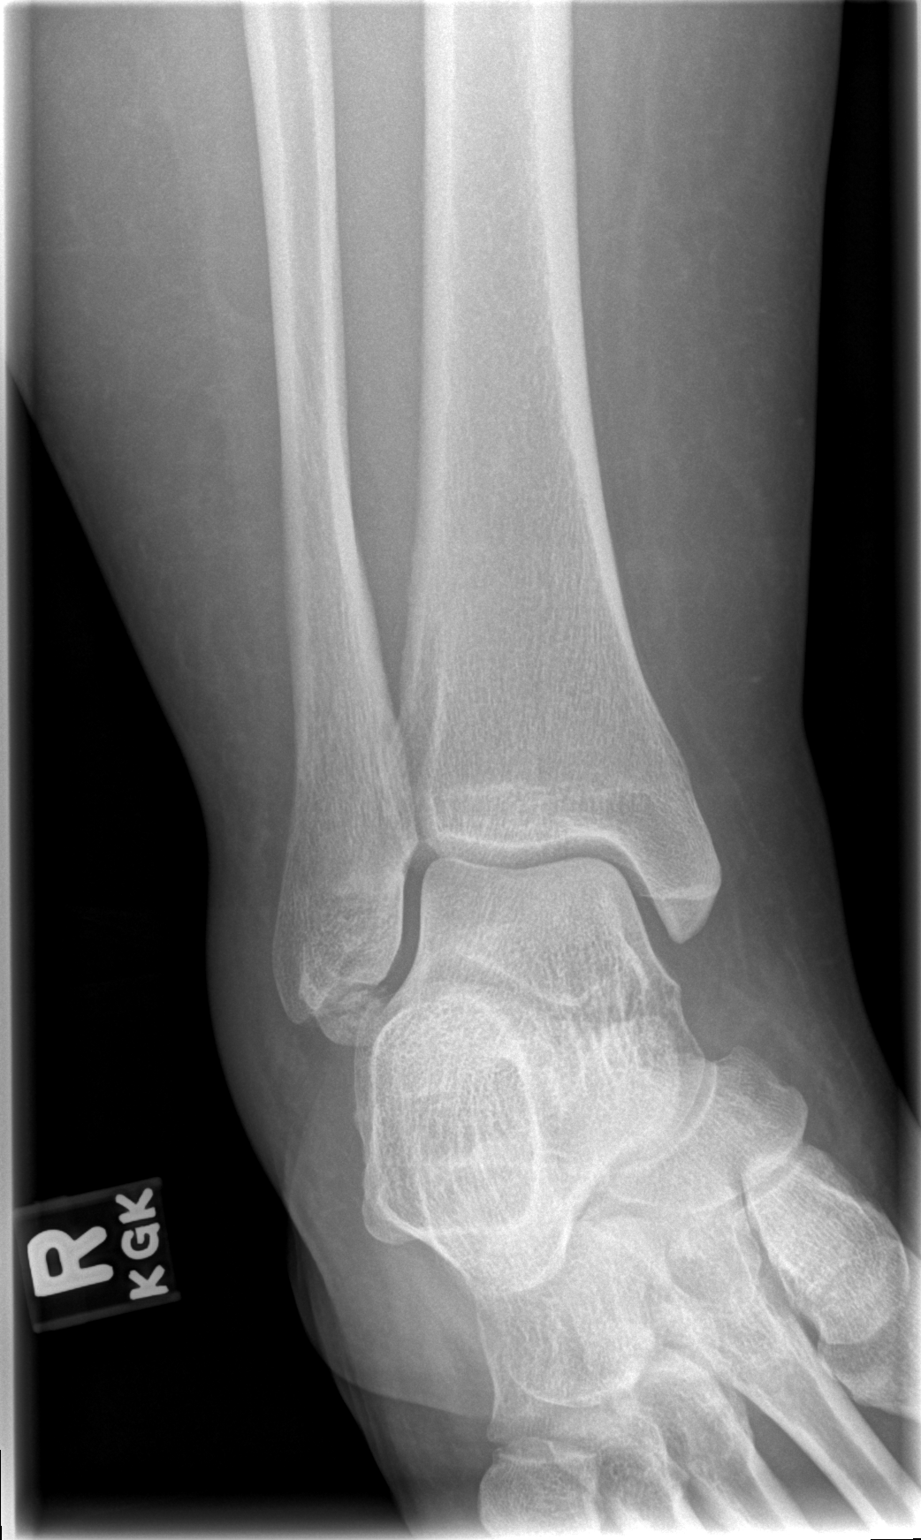

[t ankle joint lat right *]
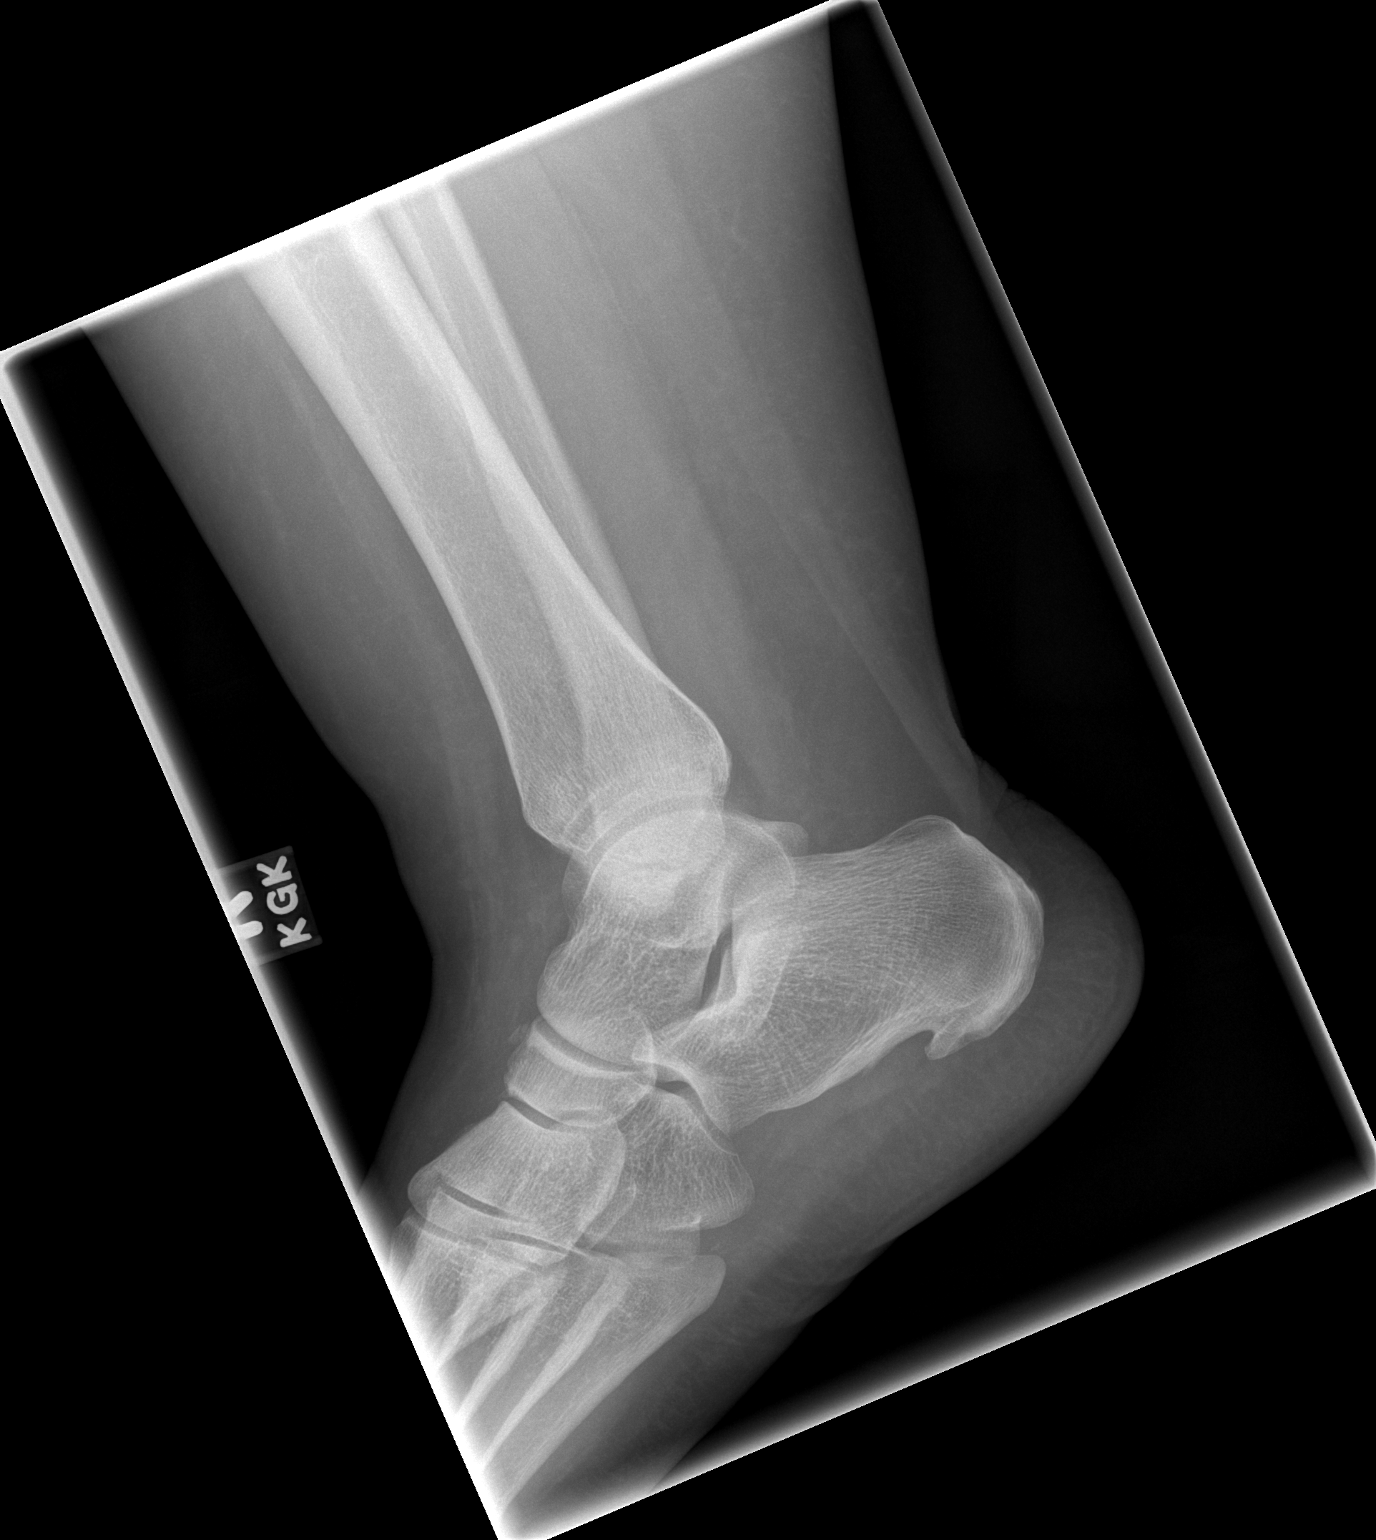

[3 of 3 positions shown; findings below may reference images not displayed]

FINDINGS: There is a nondisplaced fracture of the lateral
malleolus.  No other acute bony or joint abnormality.  Plantar
calcaneal spur is noted.
IMPRESSION: Nondisplaced distal fibular fracture.

## 2011-04-09 IMAGING — CR DG LUMBAR SPINE COMPLETE 4+V
5 series · 5 of 5 positions shown · non-contrast
Comparison: None available.

CLINICAL DATA: Fall, pain.

LUMBAR SPINE - COMPLETE 4+ VIEW

[t l-spine a.p. *]
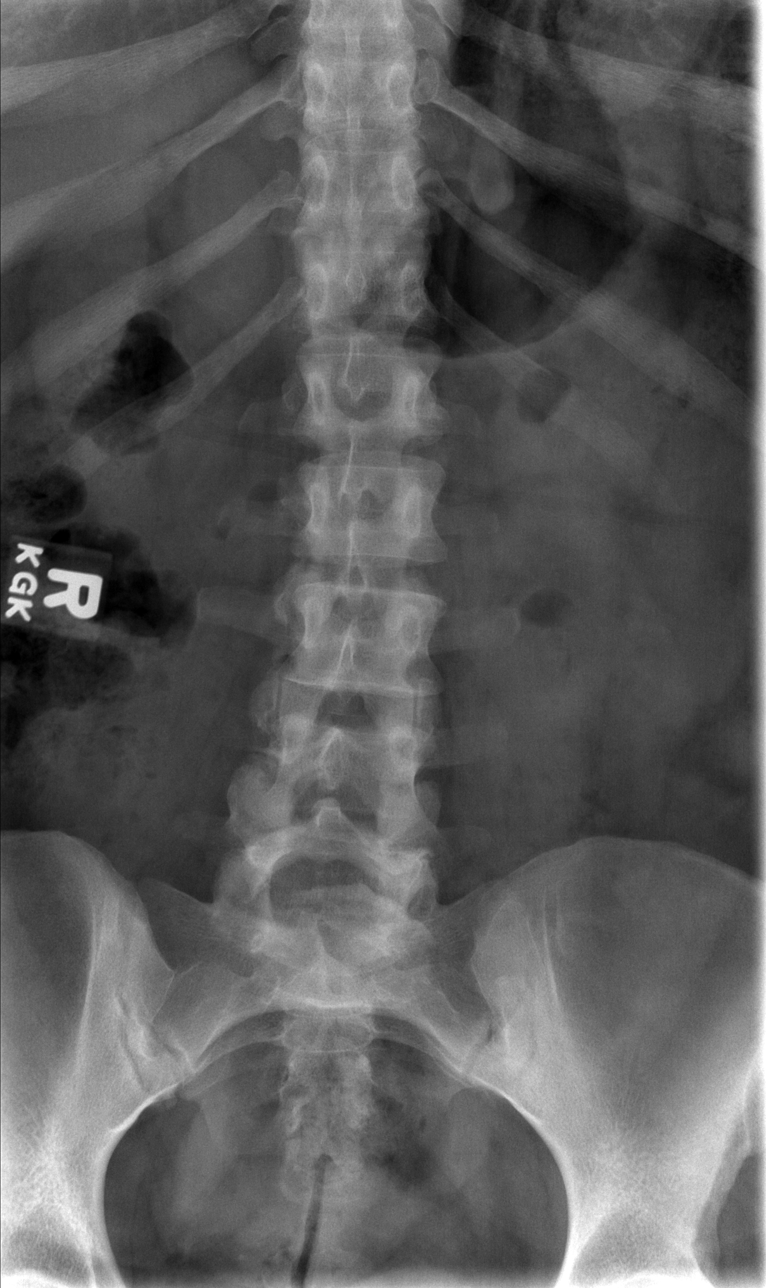

[t l-spine oblique exposure * (1 of 2)]
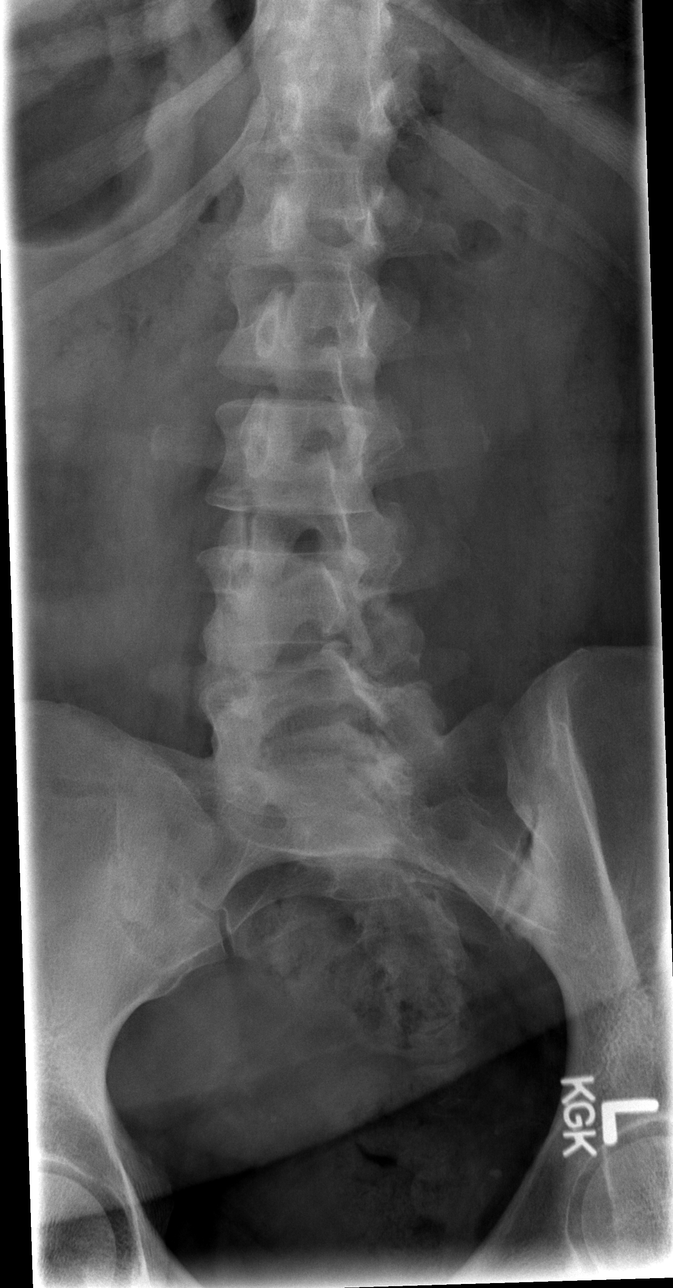

[t l-spine oblique exposure * (2 of 2)]
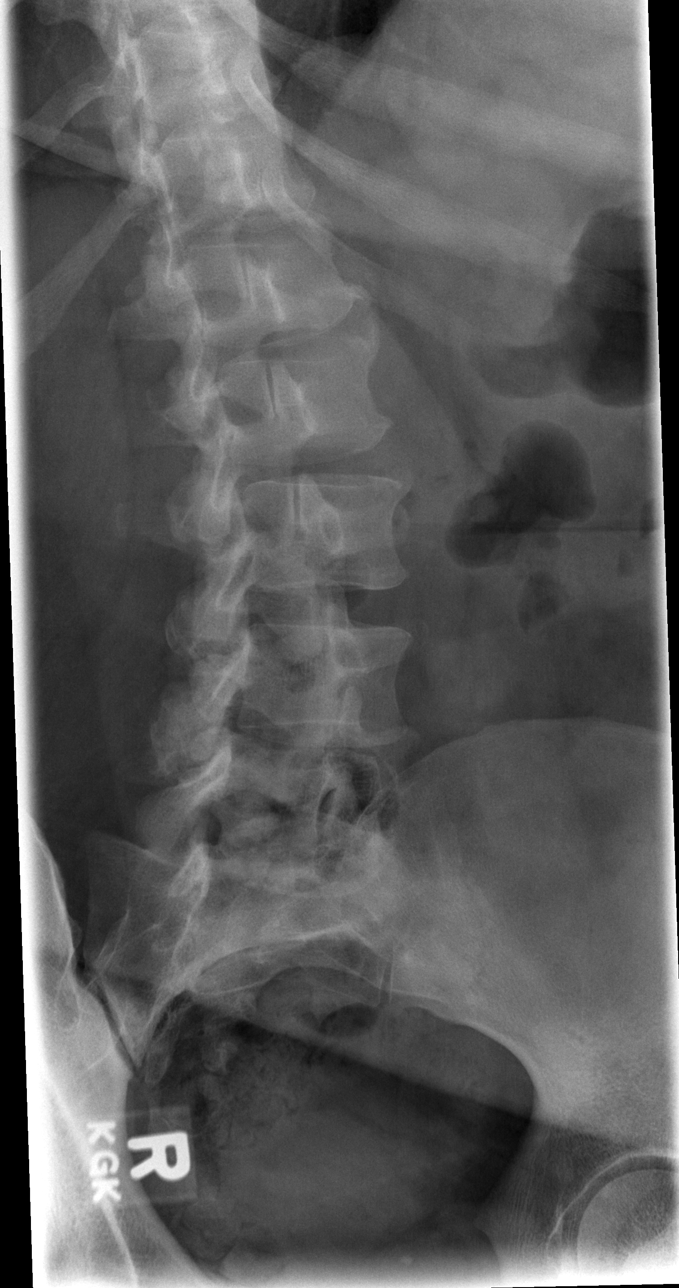

[t l-spine lat *]
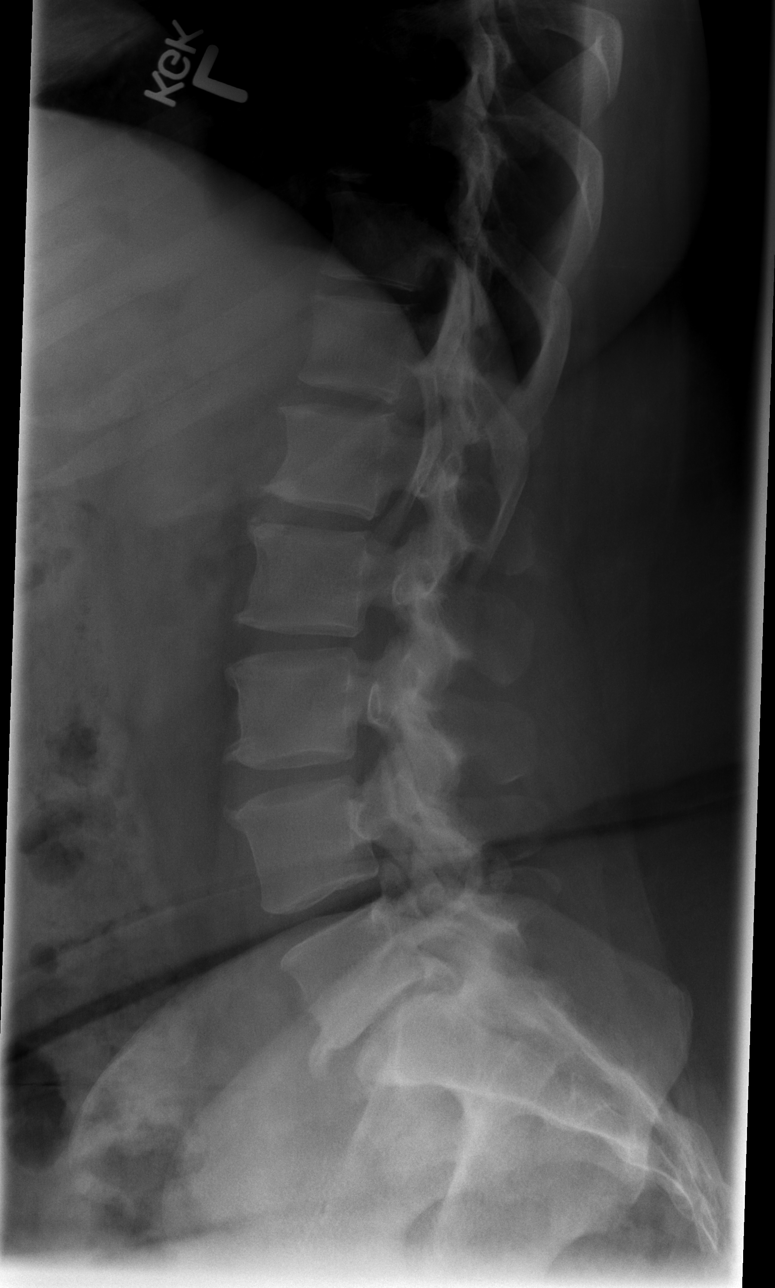

[t l-spine l5-s1 spot *]
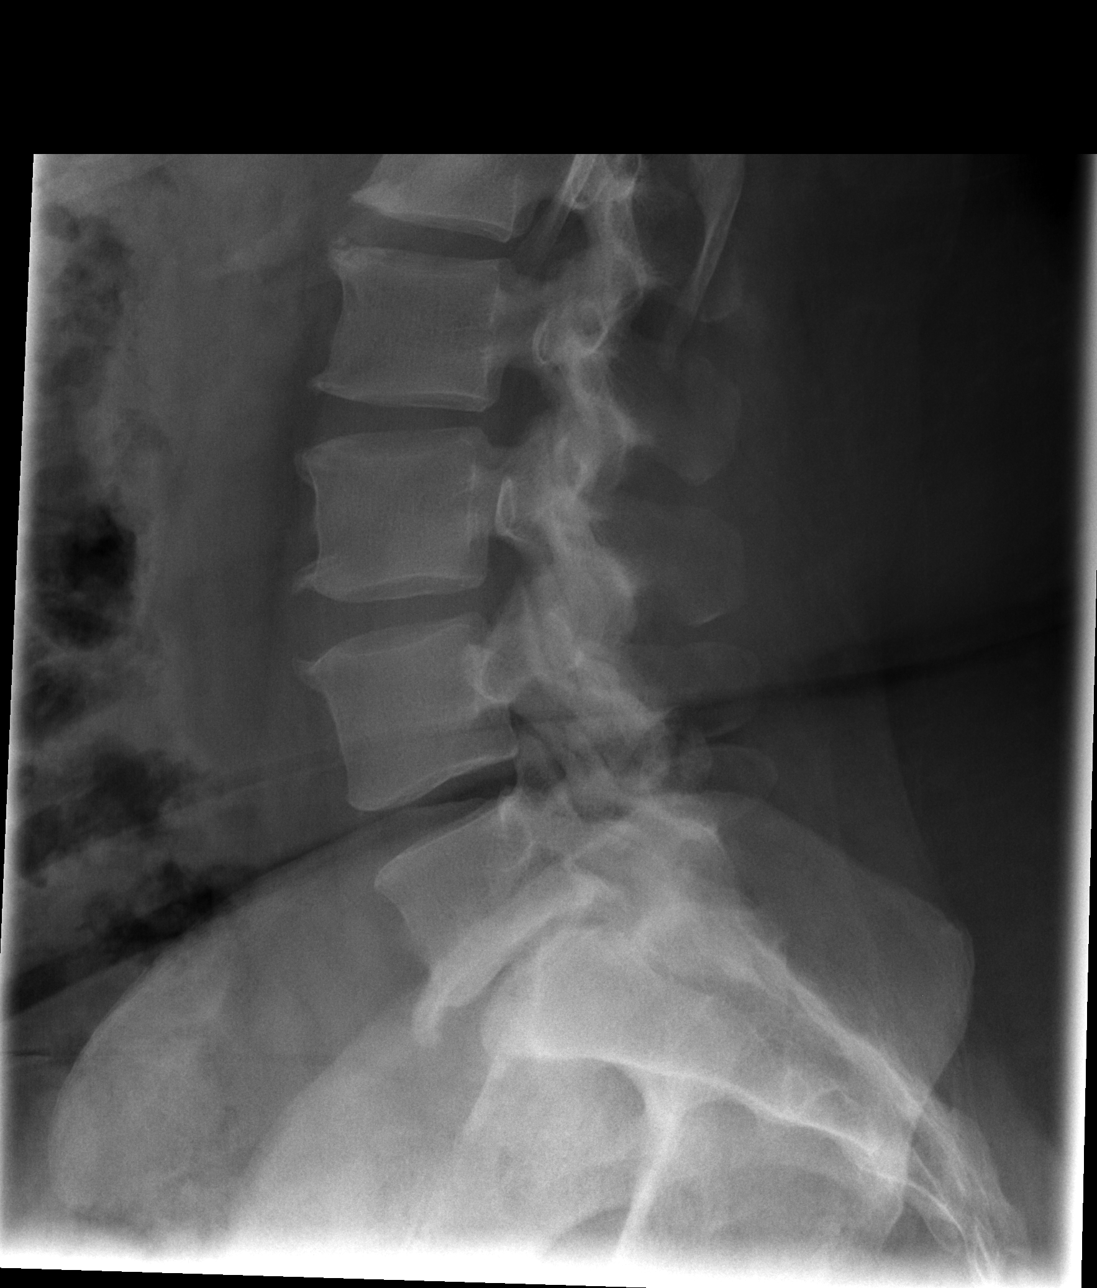

[5 of 5 positions shown; findings below may reference images not displayed]

FINDINGS: Vertebral body and height and alignment are unremarkable.
The patient has degenerative disease with multilevel anterior
endplate spurring noted.  Facet degenerative disease lower lumbar
spine noted.
IMPRESSION: No acute finding.  Lumbar spondylosis noted.

## 2011-04-13 NOTE — Op Note (Signed)
[facility]  Patient:    Deborah [name], Deborah [name] Visit Number: [id number] MRN: [id number]          Service Type: SUR Location: 3W 4782 01 Attending Physician:  Caleen Essex Dictated by:   Ollen Gross. Vernell Morgans, M.D. Proc. Date: 05/11/02 Admit Date:  05/11/2002 Discharge Date: 05/12/2002                             Operative Report  PREOPERATIVE DIAGNOSIS:  Ventral hernia.  POSTOPERATIVE DIAGNOSIS:  Ventral hernia.  PROCEDURE:  Ventral hernia repair with mesh.  SURGEON:  Ollen Gross. Vernell Morgans, M.D.  ANESTHESIA:  General endotracheal.  PROCEDURE:  After informed consent was obtained, the patient was brought to the operating room and placed in the supine position on the operating table. After adequate induction of general endotracheal, the patients abdomen was prepped with Betadine and draped in the usual sterile manner. The hernia could be palpable just above the umbilicus in the midline. A vertical-oriented incision just above the umbilicus was made with a 15 blade knife. This incision was carried down through the skin and subcutaneous tissues with a Bovie electrocautery. In the subcutaneous tissue, the hernia sac was identified and retraction was carried out with Richardson retractors and the edges of the hernia sac were defined. They were separated from the rest of the subcutaneous tissues in a combination of sharp dissection with the electrocautery and blunt dissection. Once the edges of the hernia defect were identified and freed, the contents of the hernia sac, which appeared to be preperitoneal fat and omentum, were able to be reduced back within the abdominal cavity. The undersurface of the anterior abdominal wall was cleared of any adhesions and at this point, approximately six different interrupted 0 Prolene pop stitches were placed in the fascia several centimeters lateral to the edges of the hernia sac and hemostated. The defect of the  anterior abdominal wall was then closed with a running #1 Prolene stitch. A piece of 3 x 6 mesh was then cut to fit overlying the defect and was anchored in place by passing the free ends of the previously placed Prolene pops through the edges of the mesh and tying them down. The wound was then irrigated with copious amounts of saline. The subcutaneous fat was closed with a running 2-0 Vicryl stitch and the skin was closed with staples. Sterile dressings were applied. The patient tolerated the procedure well. At the end of the case, sponge, instrument, and needle counts were correct. The patient was awakened and taken to the recovery room in stable condition. Dictated by:   Ollen Gross. Vernell Morgans, M.D. Attending Physician:  Caleen Essex DD:  05/11/02 TD:  05/13/02 Job: [id number]

## 2011-04-13 NOTE — Consult Note (Signed)
[facility]  Patient:    RENLEIGH, OUELLET Visit Number: [id number] MRN: [id number]          Service Type: EMS Location: ED Attending Physician:  Donnetta Hutching Dictated by:   Ollen Gross. Vernell Morgans, M.D. Proc. Date: 05/08/02 Admit Date:  05/08/2002                            Consultation Report  Ms. Pulice is a 45 year old black female who for the last couple of months has been having some increasing pain and swelling around her belly button. She has never had any nausea or vomiting associated with this, only pain.  Her bowels have been working normally.  Her last bowel movement was this morning. She otherwise denies chest pain, nausea, vomiting, fevers, chills, shortness of breath, or dysuria.  The rest of her review of systems is unremarkable. She did have an appointment on the 24th with Dr. Jamey Ripa to have this evaluated, but does not feel like she can wait that long.  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  PAST SURGICAL HISTORY:  None.  MEDICATIONS:  None.  ALLERGIES:  None.  SOCIAL HISTORY:  She denies the use of alcohol or tobacco products.  FAMILY HISTORY:  Noncontributory.  PHYSICAL EXAMINATION  GENERAL:  She is a well-developed, well-nourished, somewhat obese black female in no acute distress.  SKIN:  Warm and dry with no jaundice.  HEENT:  Extraocular movements are intact.  Pupils are equal, round, and reactive to light.  LUNGS:  Clear bilaterally.  HEART:  Regular rate and rhythm.  ABDOMEN:  Soft and mild to moderately tender in her supraumbilical region. She has an obvious bulge but it is difficult to feel how large the herniated defect is because of her size but it does seem to reduce.  EXTREMITIES:  She has no clubbing, cyanosis, edema.  NEUROLOGIC:  She is alert and oriented x4.  HEMATOLOGIC:  I could palpate no lymphadenopathy.  ASSESSMENT AND PLAN:  This is a 45 year old  black female with an umbilical hernia that does reduce, but has been fairly symptomatic for her recently.  I have offered her a hernia repair today and she has declined.  She would like to have it done next week and I have called my office and arranged for this to be scheduled.  Her phone number is [phone].  We will call her later today with all the arrangements.  I have explained to her in detail the risks and benefits of the procedure as well as some of the technical aspects and she understands and wished to proceed. Dictated by:   Ollen Gross. Vernell Morgans, M.D. Attending Physician:  Donnetta Hutching DD:  05/08/02 TD:  05/10/02 Job: [id number]

## 2011-06-28 ENCOUNTER — Ambulatory Visit: Payer: Self-pay | Admitting: Family Medicine

## 2011-07-18 ENCOUNTER — Ambulatory Visit (INDEPENDENT_AMBULATORY_CARE_PROVIDER_SITE_OTHER): Payer: Medicaid Other | Admitting: Family Medicine

## 2011-07-18 ENCOUNTER — Telehealth: Payer: Self-pay | Admitting: Family Medicine

## 2011-07-18 ENCOUNTER — Encounter: Payer: Self-pay | Admitting: Family Medicine

## 2011-07-18 DIAGNOSIS — R232 Flushing: Secondary | ICD-10-CM | POA: Insufficient documentation

## 2011-07-18 DIAGNOSIS — F329 Major depressive disorder, single episode, unspecified: Secondary | ICD-10-CM

## 2011-07-18 DIAGNOSIS — D500 Iron deficiency anemia secondary to blood loss (chronic): Secondary | ICD-10-CM

## 2011-07-18 DIAGNOSIS — R9430 Abnormal result of cardiovascular function study, unspecified: Secondary | ICD-10-CM

## 2011-07-18 DIAGNOSIS — E669 Obesity, unspecified: Secondary | ICD-10-CM

## 2011-07-18 DIAGNOSIS — N951 Menopausal and female climacteric states: Secondary | ICD-10-CM

## 2011-07-18 DIAGNOSIS — F419 Anxiety disorder, unspecified: Secondary | ICD-10-CM

## 2011-07-18 DIAGNOSIS — F341 Dysthymic disorder: Secondary | ICD-10-CM

## 2011-07-18 LAB — BASIC METABOLIC PANEL
CO2: 24 mEq/L (ref 19–32)
Calcium: 8.8 mg/dL (ref 8.4–10.5)
Chloride: 103 mEq/L (ref 96–112)
Glucose, Bld: 92 mg/dL (ref 70–99)
Potassium: 3.5 mEq/L (ref 3.5–5.3)
Sodium: 137 mEq/L (ref 135–145)

## 2011-07-18 LAB — LIPID PANEL: HDL: 49 mg/dL (ref >39)

## 2011-07-18 LAB — CBC
HCT: 39.4 % (ref 36.0–46.0)
MCH: 24.4 pg — ABNORMAL LOW (ref 26.0–34.0)
MCHC: 30.5 g/dL (ref 30.0–36.0)
MCV: 80.1 fL (ref 78.0–100.0)
RDW: 17.1 % — ABNORMAL HIGH (ref 11.5–15.5)

## 2011-07-18 LAB — LUTEINIZING HORMONE: LH: 8.1 m[IU]/mL

## 2011-07-18 LAB — FOLLICLE STIMULATING HORMONE: FSH: 6.5 m[IU]/mL

## 2011-07-18 MED ORDER — SERTRALINE HCL 100 MG PO TABS: 100.0000 mg | ORAL_TABLET | Freq: Every day | ORAL | Status: DC

## 2011-07-18 MED ORDER — FUROSEMIDE 20 MG PO TABS: 20.0000 mg | ORAL_TABLET | Freq: Every day | ORAL | Status: DC

## 2011-07-18 NOTE — Assessment & Plan Note (Signed)
Possibly related to thyroid vs. Perimenopausal.  Will check TSH, FSH, and LH.

## 2011-07-18 NOTE — Assessment & Plan Note (Addendum)
This seems situational.  Will start Zoloft at bedtime and follow up in 2 weeks.

## 2011-07-18 NOTE — Assessment & Plan Note (Addendum)
Pt has gained about 10 lbs in the past two years.  Her depression is likely making lifestyle changes difficult.  Will try to get depression under control and re-address. Check lipids.

## 2011-07-18 NOTE — Telephone Encounter (Signed)
Deborah [name] went to get her rx for Zoloft from [facility] and discover it was more than what she usually pay.  Please call rx to [facility] on [facility] [address] [address] and [address].

## 2011-07-18 NOTE — Patient Instructions (Signed)
It was nice to meet you.  I want you to schedule an appointment on your way out for 2 weeks to get your pap smear done and follow up on what we talked about today.

## 2011-07-18 NOTE — Assessment & Plan Note (Signed)
Will check CBC today.  

## 2011-07-18 NOTE — Telephone Encounter (Signed)
Rx re sent to [facility], patient informed.
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
## 2011-07-18 NOTE — Assessment & Plan Note (Signed)
Continue Lasix, re-start Asprin.  Check bmet.

## 2011-07-23 ENCOUNTER — Encounter: Payer: Self-pay | Admitting: Family Medicine

## 2011-08-08 ENCOUNTER — Ambulatory Visit: Payer: Medicaid Other | Admitting: Family Medicine

## 2011-10-01 ENCOUNTER — Telehealth: Payer: Self-pay | Admitting: Family Medicine

## 2011-10-01 DIAGNOSIS — R9430 Abnormal result of cardiovascular function study, unspecified: Secondary | ICD-10-CM

## 2011-10-01 NOTE — Telephone Encounter (Signed)
Changed pharmacy for pt. Will forward to Dr Lula Olszewski

## 2011-10-01 NOTE — Telephone Encounter (Signed)
Please call and transer rx for lasix  from [facility] on [address] to [facility] on [address]

## 2011-10-02 NOTE — Telephone Encounter (Signed)
Please re-route to PCP tomorrow.  Thanks!

## 2011-10-03 MED ORDER — FUROSEMIDE 20 MG PO TABS: 20.0000 mg | ORAL_TABLET | Freq: Every day | ORAL | Status: DC

## 2011-10-03 NOTE — Telephone Encounter (Signed)
Rx sent to new pharmacy.

## 2011-10-03 NOTE — Telephone Encounter (Signed)
Will forward to Dr. Chamberlain 

## 2011-10-03 NOTE — Telephone Encounter (Signed)
Addended by: Ardyth Gal on: 10/03/2011 08:49 AM   Modules accepted: Orders

## 2011-10-27 HISTORY — PX: ABDOMINAL HYSTERECTOMY: SHX81

## 2012-02-05 ENCOUNTER — Ambulatory Visit: Payer: Medicaid Other | Admitting: Family Medicine

## 2012-02-14 ENCOUNTER — Ambulatory Visit (INDEPENDENT_AMBULATORY_CARE_PROVIDER_SITE_OTHER): Payer: Medicaid Other | Admitting: Family Medicine

## 2012-02-14 ENCOUNTER — Encounter: Payer: Self-pay | Admitting: Family Medicine

## 2012-02-14 VITALS — BP 111/77 | HR 91 | Temp 98.7°F | Ht 66.0 in | Wt 299.6 lb

## 2012-02-14 DIAGNOSIS — Z0000 Encounter for general adult medical examination without abnormal findings: Secondary | ICD-10-CM

## 2012-02-14 DIAGNOSIS — F329 Major depressive disorder, single episode, unspecified: Secondary | ICD-10-CM

## 2012-02-14 DIAGNOSIS — M1710 Unilateral primary osteoarthritis, unspecified knee: Secondary | ICD-10-CM

## 2012-02-14 DIAGNOSIS — D500 Iron deficiency anemia secondary to blood loss (chronic): Secondary | ICD-10-CM

## 2012-02-14 DIAGNOSIS — E669 Obesity, unspecified: Secondary | ICD-10-CM

## 2012-02-14 DIAGNOSIS — R9430 Abnormal result of cardiovascular function study, unspecified: Secondary | ICD-10-CM

## 2012-02-14 LAB — CBC
MCH: 25.5 pg — ABNORMAL LOW (ref 26.0–34.0)
MCHC: 30.8 g/dL (ref 30.0–36.0)
MCV: 82.6 fL (ref 78.0–100.0)
Platelets: 355 10*3/uL (ref 150–400)
RDW: 15.4 % (ref 11.5–15.5)

## 2012-02-14 MED ORDER — SERTRALINE HCL 100 MG PO TABS: 100.0000 mg | ORAL_TABLET | Freq: Every day | ORAL | Status: DC

## 2012-02-14 MED ORDER — NAPROXEN 500 MG PO TABS: 500.0000 mg | ORAL_TABLET | Freq: Two times a day (BID) | ORAL | Status: DC

## 2012-02-14 MED ORDER — FUROSEMIDE 20 MG PO TABS: 20.0000 mg | ORAL_TABLET | Freq: Every day | ORAL | Status: DC

## 2012-02-14 NOTE — Progress Notes (Signed)
  Subjective:    Patient ID: Deborah [name], female    DOB: [birth year], 46 y.o.   MRN: [id number]  HPI  Patient comes in for follow up.  Again, she was supposed to get her well woman exam and her period has started and she refuses pelvic exam.  This happened last August, and patient failed to follow up in 2 weeks as instructed at that time.  She promises she will come back in a week or two for pap.   Right knee pain- hx of "twisting knee" 10 years ago, had x-rays then that showed arthritis.  She has not had a new injury to the knee, but it has been extremely painful in the front of her knee.  She does not think there has been swelling.  Last week it was hurting so badly she could barely walk.   Obesity- she has been avoiding fried foods, but still drinking regular sodas.  Likes salads.   Review of Systems Pertinent items in HPI    Objective:   Physical Exam BP 111/77  Pulse 91  Temp(Src) 98.7 F (37.1 C) (Oral)  Ht 5\' 6"  (1.676 m)  Wt 299 lb 9.6 oz (135.898 kg)  BMI 48.36 kg/m2  LMP 02/13/2012 General appearance: alert, cooperative, no distress and morbidly obese Head: Normocephalic, without obvious abnormality, atraumatic Eyes: conjunctivae/corneas clear. PERRL, EOM's intact. Fundi benign. Neck: no adenopathy, no JVD, supple, symmetrical, trachea midline and thyroid not enlarged, symmetric, no tenderness/mass/nodules Lungs: clear to auscultation bilaterally Heart: regular rate and rhythm, S1, S2 normal, no murmur, click, rub or gallop Extremities: Patient with chronic non-pitting edema of LE. Pulses: 2+ and symmetric Right Knee: Exam limited by body habitus.  Normal to inspection with no erythema or effusion or obvious bony abnormalities. Palpation normal with no warmth or joint line tenderness or patellar tenderness or condyle tenderness. ROM normal in flexion and extension and lower leg rotation. Ligaments with solid consistent endpoints including ACL, PCL, LCL, MCL, but pain  with LCL and MCL testing.  Negative Mcmurray's and provocative meniscal tests. Non painful patellar compression. Patellar and quadriceps tendons unremarkable. Hamstring and quadriceps strength is normal.        Assessment & Plan:

## 2012-02-14 NOTE — Patient Instructions (Signed)
It was good to see you.  I will send you a letter with your lab work results.   For your knee, please take the naproxen one pill with breakfast and one with dinner, every day for 7 days.  Then take one pill up to two times a day with food as needed for knee pain. Please go at your convenience to have your knee x-ray taken.   Please make an appointment to have your Well Woman Exam and pap smear.

## 2012-02-14 NOTE — Assessment & Plan Note (Signed)
Refill Lasix

## 2012-02-14 NOTE — Assessment & Plan Note (Signed)
With some fatigue, will check CBC.

## 2012-02-14 NOTE — Assessment & Plan Note (Signed)
Currently with right knee pain, will check standing films.  Advised she wear knee brace she has, ice knee.  Rx naproxen, patient instructed to schedule for one week then use PRN, advised NOT to use with ibuprofen.

## 2012-02-14 NOTE — Assessment & Plan Note (Signed)
Patient has lost 10 lbs since last visit, will re-check LDL to see if it has improved with weight loss. Discussed Arthritis as caused by obesity.  Encouraged her to continue to try to lose weight.

## 2012-02-14 NOTE — Assessment & Plan Note (Signed)
Improves since last visit.  Refill Zoloft.

## 2012-02-19 ENCOUNTER — Encounter: Payer: Self-pay | Admitting: Family Medicine

## 2012-03-06 ENCOUNTER — Ambulatory Visit: Payer: Medicaid Other | Admitting: Family Medicine

## 2012-03-10 ENCOUNTER — Ambulatory Visit
Admission: RE | Admit: 2012-03-10 | Discharge: 2012-03-10 | Disposition: A | Discharge status: Home / Self Care | Payer: Medicaid Other | Source: Ambulatory Visit | Attending: Family Medicine | Admitting: Family Medicine

## 2012-03-10 DIAGNOSIS — M1710 Unilateral primary osteoarthritis, unspecified knee: Secondary | ICD-10-CM

## 2012-03-26 ENCOUNTER — Telehealth: Payer: Self-pay | Admitting: Family Medicine

## 2012-03-26 NOTE — Telephone Encounter (Signed)
chamberlain

## 2012-03-26 NOTE — Telephone Encounter (Signed)
Please contact patient regarding  MRI taken on 4/15

## 2012-03-28 NOTE — Telephone Encounter (Signed)
Called patient back, no answer, I left a message.  She had XRays of her knees in March showing worsening Osteoarthritis.  I do not know anything about an MRI- nor do I see one in our system.  Not sure if she confused X-rays with MRI or if she has had an MRI for something somewhere else.  Asked her to call back.    Deborah [name] 03/28/2012 8:36 AM

## 2012-04-01 NOTE — Telephone Encounter (Signed)
Patient is returning the call to Dr. Lula Olszewski.

## 2012-04-01 NOTE — Telephone Encounter (Signed)
Called again, and spoke with Deborah [name].  She was mistaken, she had x-rays of her knees, not an MRI.  I let her know the x-rays showed osteoarthritis, worse on the right than the left.  Patient already has follow up appointment scheduled.

## 2012-04-16 ENCOUNTER — Ambulatory Visit: Payer: Medicaid Other | Admitting: Family Medicine

## 2012-05-01 ENCOUNTER — Ambulatory Visit: Payer: Medicaid Other | Admitting: Family Medicine

## 2012-05-08 ENCOUNTER — Ambulatory Visit (INDEPENDENT_AMBULATORY_CARE_PROVIDER_SITE_OTHER): Payer: Medicaid Other | Admitting: Family Medicine

## 2012-05-08 ENCOUNTER — Encounter: Payer: Self-pay | Admitting: Family Medicine

## 2012-05-08 ENCOUNTER — Other Ambulatory Visit (HOSPITAL_COMMUNITY)
Admission: RE | Admit: 2012-05-08 | Discharge: 2012-05-08 | Disposition: A | Discharge status: Home / Self Care | Payer: Medicaid Other | Source: Ambulatory Visit | Attending: Family Medicine | Admitting: Family Medicine

## 2012-05-08 VITALS — BP 121/81 | HR 103 | Temp 98.3°F | Ht 66.0 in | Wt 299.0 lb

## 2012-05-08 DIAGNOSIS — R0989 Other specified symptoms and signs involving the circulatory and respiratory systems: Secondary | ICD-10-CM

## 2012-05-08 DIAGNOSIS — Z0000 Encounter for general adult medical examination without abnormal findings: Secondary | ICD-10-CM

## 2012-05-08 DIAGNOSIS — R0683 Snoring: Secondary | ICD-10-CM

## 2012-05-08 DIAGNOSIS — Z01419 Encounter for gynecological examination (general) (routine) without abnormal findings: Secondary | ICD-10-CM | POA: Insufficient documentation

## 2012-05-08 DIAGNOSIS — Z124 Encounter for screening for malignant neoplasm of cervix: Secondary | ICD-10-CM

## 2012-05-08 NOTE — Assessment & Plan Note (Signed)
Pap done today  

## 2012-05-08 NOTE — Assessment & Plan Note (Signed)
History suspicious for sleep apnea. Will refer for sleep study.

## 2012-05-08 NOTE — Patient Instructions (Signed)
It was good to see you.  I will send you a letter with the results of your Pap smear.    I have ordered a Sleep study to see if your snoring and choking at night time are from sleep apnea.  The sleep study will call you to schedule your appointment.

## 2012-05-08 NOTE — Progress Notes (Signed)
  Subjective:    Patient ID: Deborah [name], female    DOB: September 30, 1966, 46 y.o.   MRN: [id number]  HPI  [name] comes in for her Pap.  She says she is having her period again today, but agrees to do the pap.   She also complains of some snoring and choking when she is sleeping.  She says she was unaware of it until a few weeks ago her son woke her up due to the choking.  She denies excessive daytime tiredness or headaches.    PMHx: Osteoarthritis, Obesity, GERD, Anemia Family History: Patient does have some siblings and cousins who wear a CPAP machine  History  Substance Use Topics  . Smoking status: Never Smoker   . Smokeless tobacco: Never Used  . Alcohol Use: Not on file    Review of Systems Pertinent items in HPI.     Objective:   Physical Exam BP 121/81  Pulse 103  Temp 98.3 F (36.8 C) (Oral)  Ht 5\' 6"  (1.676 m)  Wt 299 lb (135.626 kg)  BMI 48.26 kg/m2  LMP 05/08/2012 General appearance: alert, cooperative and no distress Neck: no adenopathy, supple, symmetrical, trachea midline and thyroid not enlarged, symmetric, no tenderness/mass/nodules Lungs: clear to auscultation bilaterally Heart: regular rate and rhythm, S1, S2 normal, no murmur, click, rub or gallop Abdomen: soft, non-tender; bowel sounds normal; no masses,  no organomegaly Pelvic: cervix normal in appearance, external genitalia normal, no adnexal masses or tenderness, no cervical motion tenderness, rectovaginal septum normal, uterus normal size, shape, and consistency, vagina normal without discharge and Scant amount of blood in vaginal vault.  Exam extremely limited by body habitus.        Assessment & Plan:

## 2012-05-12 ENCOUNTER — Ambulatory Visit: Payer: Medicaid Other | Admitting: Family Medicine

## 2012-05-16 ENCOUNTER — Telehealth: Payer: Self-pay | Admitting: Family Medicine

## 2012-05-16 DIAGNOSIS — IMO0002 Reserved for concepts with insufficient information to code with codable children: Secondary | ICD-10-CM | POA: Insufficient documentation

## 2012-05-16 NOTE — Telephone Encounter (Signed)
Never mind I see that on the letter now. Deborah [name], Deborah [name]

## 2012-05-16 NOTE — Telephone Encounter (Signed)
Is this not something that can be done in our GYN clinic?Marland KitchenMilas Gain, Maryjo Rochester

## 2012-05-16 NOTE — Telephone Encounter (Signed)
Called patient to notify her - pap smear was abnormal.  Report Atypical Glandular cells.  Told her she needed to have colposcopy and possible biopsy of cervix and possible endometrial biopsy.  Let her know I would refer her to the gynecologist for this.    I answered her questions.  Have put in referral to GYN.

## 2012-05-19 ENCOUNTER — Encounter: Payer: Self-pay | Admitting: *Deleted

## 2012-05-19 ENCOUNTER — Telehealth: Payer: Self-pay | Admitting: Family Medicine

## 2012-05-19 NOTE — Telephone Encounter (Signed)
Patient is calling to discuss her results further.  She would like to speak to Dr. Lula Olszewski.

## 2012-05-19 NOTE — Telephone Encounter (Signed)
Called patient back, she says she "couldn't really talk" when I called her before.  Reviewed that Pap smear showed abnormal cells that could be pre-cancerous cells from cervix or uterus.  Said she needed to go to GYN, needs colposcopy, and maybe endometrial biopsy.  I also explained what those two tests were.  Reminded patient to be sure to keep her appointment at the [facility] clinic, she assured me she would.

## 2012-05-26 ENCOUNTER — Encounter: Payer: Self-pay | Admitting: *Deleted

## 2012-06-04 ENCOUNTER — Encounter (HOSPITAL_BASED_OUTPATIENT_CLINIC_OR_DEPARTMENT_OTHER): Payer: Medicaid Other

## 2012-06-09 ENCOUNTER — Encounter: Payer: Medicaid Other | Admitting: Physician Assistant

## 2012-07-16 ENCOUNTER — Encounter: Payer: Medicaid Other | Admitting: Obstetrics & Gynecology

## 2012-07-18 ENCOUNTER — Other Ambulatory Visit: Payer: Self-pay | Admitting: Family Medicine

## 2012-07-18 DIAGNOSIS — R9430 Abnormal result of cardiovascular function study, unspecified: Secondary | ICD-10-CM

## 2012-07-18 NOTE — Telephone Encounter (Signed)
Patient is calling to request a refill on Furosemide to go to [facility] on [address] instead of [address].

## 2012-07-21 MED ORDER — FUROSEMIDE 20 MG PO TABS: 20.0000 mg | ORAL_TABLET | Freq: Every day | ORAL | Status: DC

## 2012-08-20 ENCOUNTER — Encounter: Payer: Medicaid Other | Admitting: Obstetrics & Gynecology

## 2012-08-28 ENCOUNTER — Encounter: Payer: Self-pay | Admitting: Family Medicine

## 2012-08-28 ENCOUNTER — Ambulatory Visit (INDEPENDENT_AMBULATORY_CARE_PROVIDER_SITE_OTHER): Payer: Medicaid Other | Admitting: Family Medicine

## 2012-08-28 VITALS — BP 126/87 | HR 91 | Temp 98.6°F | Ht 66.0 in | Wt 293.0 lb

## 2012-08-28 DIAGNOSIS — H5200 Hypermetropia, unspecified eye: Secondary | ICD-10-CM | POA: Insufficient documentation

## 2012-08-28 DIAGNOSIS — H538 Other visual disturbances: Secondary | ICD-10-CM

## 2012-08-28 DIAGNOSIS — M25539 Pain in unspecified wrist: Secondary | ICD-10-CM

## 2012-08-28 DIAGNOSIS — K921 Melena: Secondary | ICD-10-CM

## 2012-08-28 DIAGNOSIS — D500 Iron deficiency anemia secondary to blood loss (chronic): Secondary | ICD-10-CM

## 2012-08-28 DIAGNOSIS — E669 Obesity, unspecified: Secondary | ICD-10-CM

## 2012-08-28 HISTORY — DX: Hypermetropia, unspecified eye: H52.00

## 2012-08-28 HISTORY — DX: Pain in unspecified wrist: M25.539

## 2012-08-28 HISTORY — DX: Melena: K92.1

## 2012-08-28 LAB — CBC
MCH: 26.6 pg (ref 26.0–34.0)
MCHC: 32.8 g/dL (ref 30.0–36.0)
RDW: 15.6 % — ABNORMAL HIGH (ref 11.5–15.5)

## 2012-08-28 LAB — GLUCOSE, CAPILLARY: Glucose-Capillary: 90 mg/dL (ref 70–99)

## 2012-08-28 MED ORDER — WRIST SPLINT/COCK-UP/LEFT M MISC: 1.0000 | Freq: Once | Status: DC

## 2012-08-28 NOTE — Assessment & Plan Note (Signed)
Suspect carpel tunnel, Rx for wrist splint and pt info given.

## 2012-08-28 NOTE — Patient Instructions (Signed)
Carpal Tunnel Syndrome  The carpal tunnel is an area under the skin of the palm of your hand. Nerves, blood vessels, and strong tissues (tendons) pass through the tunnel. The tunnel can become puffy (swollen). If this happens, a nerve can be pinched in the wrist. This causes carpal tunnel syndrome.   HOME CARE  · Take all medicine as told by your doctor.  · If you were given a splint, wear it as told. Wear it at night or at times when your doctor told you to.  · Rest your wrist from the activity that causes your pain.  · Put ice on your wrist after long periods of wrist activity.  · Put ice in a plastic bag.  · Place a towel between your skin and the bag.  · Leave the ice on for 15 to 20 minutes, 3 to 4 times a day.  · Keep all doctor visits as told.  GET HELP RIGHT AWAY IF:  · You have new problems you cannot explain.  · Your problems get worse and medicine does not help.  MAKE SURE YOU:   · Understand these instructions.  · Will watch your condition.  · Will get help right away if you are not doing well or get worse.  Document Released: 11/01/2011 Document Revised: 02/04/2012 Document Reviewed: 11/01/2011  [employer]® Patient Information ©2013 [employer].

## 2012-08-28 NOTE — Assessment & Plan Note (Signed)
Normal neuro exam, Distance vision is normal today.  Suspect she may be developing far-sightedness, reassured her, suggested to try to see eye doctor if problem continues.

## 2012-08-28 NOTE — Progress Notes (Signed)
  Subjective:    Patient ID: Deborah [name], female    DOB: 07-07-1966, 46 y.o.   MRN: [id number]  HPI  Dlisa comes in with several different complaints  1) Blurry vision- she says that twice over the past few weeks she has had some blurry vision while trying to do the cross word puzzle in the paper.  She denies any headaches, dizziness, chest pain, dyspnea, palpitations. She has not seen an eye doctor in a long time.   2) Hand numbness- Left hand with numbness and tingling, especially at night and when she wakes up in the morning.  She uses this this hand for most everything (right hand with congenital malformation).  She denies any injury, any neck pain.   3) GI upset- she has had alternating diarrhea and bloating, and says when she wiped a few times saw some pink/red on the toilet paper, no gross blood in stool or toilet.  She does not have a family history of colon cancer, but other cancers like lung cancer run in her family so she is very worried about colon cancer too.   Past Medical History  Diagnosis Date  . Congenital birth defect     Right hand  . Anemia    Family History  Problem Relation Age of Onset  . Cancer Maternal Grandfather    History  Substance Use Topics  . Smoking status: Never Smoker   . Smokeless tobacco: Never Used  . Alcohol Use: No    Review of Systems     Objective:   Physical Exam BP 126/87  Pulse 91  Temp 98.6 F (37 C) (Oral)  Ht 5\' 6"  (1.676 m)  Wt 293 lb (132.904 kg)  BMI 47.29 kg/m2 General appearance: alert, cooperative and no distress Lungs: clear to auscultation bilaterally Heart: regular rate and rhythm, S1, S2 normal, no murmur, click, rub or gallop Abdomen: soft, non-tender; bowel sounds normal; no masses,  no organomegaly Neurologic: Cranial nerves: normal L Wrist: no swelling or deformity, Tinel's and Phalen's negative. Normal sensation and strength. Neck: Spurling's negative.        Assessment & Plan:

## 2012-08-28 NOTE — Assessment & Plan Note (Addendum)
Suspect this is from straining with BM or hemorrhoids, she declines rectal exam today.  Will check CBC give stool cards, and if + GI referral.

## 2012-08-29 ENCOUNTER — Encounter: Payer: Self-pay | Admitting: *Deleted

## 2012-09-01 ENCOUNTER — Encounter: Payer: Self-pay | Admitting: Family Medicine

## 2012-09-04 ENCOUNTER — Telehealth: Payer: Self-pay | Admitting: Family Medicine

## 2012-09-04 LAB — HEMOCCULT GUIAC POC 1CARD (OFFICE)

## 2012-09-04 NOTE — Addendum Note (Signed)
Addended by: [place], Strother Everitt on: 09/04/2012 05:15 PM   Modules accepted: Orders

## 2012-09-04 NOTE — Telephone Encounter (Signed)
Patient is calling to find what her results are.

## 2012-09-04 NOTE — Telephone Encounter (Signed)
Spoke with patient and informed her of normal results, patient expressed understanding.

## 2012-09-08 ENCOUNTER — Telehealth: Payer: Self-pay | Admitting: Family Medicine

## 2012-09-08 DIAGNOSIS — K921 Melena: Secondary | ICD-10-CM

## 2012-09-08 NOTE — Telephone Encounter (Signed)
Pt is asking for her results of the stool cards - would like to heard from someone today

## 2012-09-09 ENCOUNTER — Telehealth: Payer: Self-pay | Admitting: Family Medicine

## 2012-09-09 NOTE — Telephone Encounter (Signed)
Please notify patient hemoccult was positive and refer to GI.

## 2012-09-09 NOTE — Addendum Note (Signed)
Addended by: Madolyn Frieze, Marylene Land J on: 09/09/2012 10:24 AM   Modules accepted: Orders

## 2012-09-09 NOTE — Telephone Encounter (Signed)
Patient is calling for the results of her stool cards.  She expected a call yesterday, but didn't get it and would like it today please.

## 2012-09-09 NOTE — Telephone Encounter (Signed)
Informed patient of positive stool cards, and that a referral was placed for GI. Informed patient that she should hear from [place] by the end of the week with an appointment. Patient expressed understanding.

## 2012-09-10 ENCOUNTER — Encounter: Payer: Self-pay | Admitting: Internal Medicine

## 2012-09-22 ENCOUNTER — Encounter: Payer: Self-pay | Admitting: Obstetrics & Gynecology

## 2012-09-22 ENCOUNTER — Other Ambulatory Visit (HOSPITAL_COMMUNITY)
Admission: RE | Admit: 2012-09-22 | Discharge: 2012-09-22 | Disposition: A | Discharge status: Home / Self Care | Payer: Medicaid Other | Source: Ambulatory Visit | Attending: Obstetrics & Gynecology | Admitting: Obstetrics & Gynecology

## 2012-09-22 ENCOUNTER — Ambulatory Visit (INDEPENDENT_AMBULATORY_CARE_PROVIDER_SITE_OTHER): Payer: Medicaid Other | Admitting: Obstetrics & Gynecology

## 2012-09-22 VITALS — BP 133/87 | HR 111 | Temp 97.8°F | Ht 66.0 in | Wt 295.7 lb

## 2012-09-22 DIAGNOSIS — R6889 Other general symptoms and signs: Secondary | ICD-10-CM

## 2012-09-22 DIAGNOSIS — N938 Other specified abnormal uterine and vaginal bleeding: Secondary | ICD-10-CM

## 2012-09-22 DIAGNOSIS — C549 Malignant neoplasm of corpus uteri, unspecified: Secondary | ICD-10-CM | POA: Insufficient documentation

## 2012-09-22 DIAGNOSIS — IMO0002 Reserved for concepts with insufficient information to code with codable children: Secondary | ICD-10-CM

## 2012-09-22 DIAGNOSIS — Z01812 Encounter for preprocedural laboratory examination: Secondary | ICD-10-CM

## 2012-09-22 DIAGNOSIS — N949 Unspecified condition associated with female genital organs and menstrual cycle: Secondary | ICD-10-CM

## 2012-09-22 LAB — POCT PREGNANCY, URINE: Preg Test, Ur: NEGATIVE

## 2012-09-22 NOTE — Progress Notes (Signed)
  46 yo morbidly obese AA lady with a year's h/o DUB. A recent pap smear showed AGCUS, favor endometrial origin.  Review of Systems   Objective:   Physical Exam  Colposcopy adequate, normal findings  Endometrial biopsy done with Pipelle and 3 passes, large amount of obtained  She tolerated the procedure well.   Assessment & Plan:   DUB, AGCUS- await ECC and EMBX

## 2012-09-23 ENCOUNTER — Telehealth: Payer: Self-pay | Admitting: *Deleted

## 2012-09-23 NOTE — Telephone Encounter (Signed)
Pt left message requesting call back from nurse.  I returned her call and she stated that she is having bleeding and cramping since the endo Bx yesterday. She wants to know what she can take for pain. I advised pt that she can take ibuprofen 600 mg every 6 hrs as needed for pain or the naprosyn that she has on her med list. I first verified that she is not currently taking naprosyn.  Pt states she takes that for knee pain which she is not currently having.  I advised her that she cannot take both naprosyn and ibuprofen- just one or the other.  She asked if she may take acetaminophen and I said yes, however the ibuprofen or naprosyn will work better for the type of pain that she is having.  Pt states that she is changing her sani pad every 1-2 hrs but it is not saturated.  I advised pt to go to [facility]U if her bleeding becomes 1 or more saturated pads per hour.  Pt voiced understanding.

## 2012-09-24 ENCOUNTER — Ambulatory Visit (HOSPITAL_COMMUNITY): Payer: Medicaid Other

## 2012-09-26 ENCOUNTER — Telehealth: Payer: Self-pay | Admitting: General Practice

## 2012-09-26 NOTE — Telephone Encounter (Signed)
Called patient and left message stating we had some appt information to go over with her and if she could please give [place] a call back. Her new appt will be 11/4 at 1:30, patient not aware of this yet

## 2012-09-26 NOTE — Telephone Encounter (Signed)
Message copied by Kathee Delton on Fri Sep 26, 2012  9:56 AM ------      Message from: Nicholaus Bloom C      Created: Thu Sep 25, 2012  2:51 PM       Her biopsy showed endometrial cancer. She needs a clinic appt right away/ ASAP to get these results and get a referral to the gyn onc.      Thanks

## 2012-09-26 NOTE — Telephone Encounter (Signed)
Called patient and left message stating we had moved up her appt to this coming Monday 11/4 at 1:30 and that it was important to come to her appt and if she has any questions or cannot make the appt to please give [place] a call back

## 2012-09-29 ENCOUNTER — Ambulatory Visit (INDEPENDENT_AMBULATORY_CARE_PROVIDER_SITE_OTHER): Payer: Medicaid Other | Admitting: Obstetrics & Gynecology

## 2012-09-29 ENCOUNTER — Encounter: Payer: Self-pay | Admitting: Obstetrics & Gynecology

## 2012-09-29 VITALS — BP 126/78 | HR 106 | Temp 98.6°F | Ht 66.0 in | Wt 292.0 lb

## 2012-09-29 DIAGNOSIS — C549 Malignant neoplasm of corpus uteri, unspecified: Secondary | ICD-10-CM

## 2012-09-29 DIAGNOSIS — C541 Malignant neoplasm of endometrium: Secondary | ICD-10-CM

## 2012-09-29 DIAGNOSIS — N920 Excessive and frequent menstruation with regular cycle: Secondary | ICD-10-CM

## 2012-09-29 NOTE — Progress Notes (Signed)
Subjective:     Patient ID: Deborah [name], female   DOB: 1966-07-11, 46 y.o.   MRN: [id number]  HPI Pt presents for results of her bx.  Pt reports a h/o irregular tuterine bleeding for ~1year.  Review of Systems     Objective:   Physical Exam BP 126/78  Pulse 106  Temp 98.6 F (37 C) (Oral)  Ht 5\' 6"  (1.676 m)  Wt 292 lb (132.45 kg)  BMI 47.13 kg/m2  LMP 09/20/2012  09/22/12 1. Endometrium, biopsy - POSITIVE FOR ENDOMETRIAL ADENOCARCINOMA. - SEE COMMENT. 2. Endocervix, curettage - POSITIVE FOR ENDOMETRIAL ADENOCARCINOMA. - BENIGN ENDOCERVICAL MUCOSA PRESENT.     Assessment:     Adenocarcinoma of the endometrium    Plan:     Reviewed with pt bx results and possible outcomes of disease.  All of her questions were answered. face to face time was spent with pt  appt made with GYN/ONC Dr. Kristen Cardinal Nov 7th @ 1pm  Eber Jones L. Harraway-Smith, M.D., Evern Core

## 2012-09-29 NOTE — Patient Instructions (Addendum)
Cancer of the Uterus The uterus is part of a woman's reproductive system. It is the hollow, pear-shaped organ where a baby grows. The uterus is in the pelvis between the bladder and the rectum. The narrow, lower portion of the uterus is the cervix. The fallopian tubes extend from either side of the top of the uterus to the ovaries. The wall of the uterus has two layers of tissue. The inner layer, or lining, is the endometrium. The outer layer is muscle tissue called the myometrium. In women of childbearing age, the lining of the uterus grows and thickens each month to prepare for pregnancy. If a woman does not become pregnant, the thick, bloody lining flows out of the body through the vagina. This flow is called menstruation. TYPES OF UTERINE CANCER  The most common type of cancer of the uterus begins in the lining (endometrium). It is called endometrial cancer, uterine cancer, or cancer of the uterus. It is seen in 2% to 3% of women.  A different type of cancer, uterine sarcoma, develops in the muscle (myometrium). Cancer that begins in the cervix is also a different type of cancer.  Rarely, a noncancerous fibroid tumor of the uterus develops into a sarcoma. CAUSES  No one knows the exact causes of uterine cancer. But it is clear that this disease is not contagious. No one can "catch" cancer from another person. Women who get this disease are more likely than other women to have certain risk factors. A risk factor is something that increases a person's chance of developing the disease.  Most women who have known risk factors do not get uterine cancer. On the other hand, many who do get this disease have none of these factors. Doctors can seldom explain why one woman gets uterine cancer and another does not.  Studies have found the following risk factors:  Age. Cancer of the uterus occurs mostly in women over age 50.  Endometrial hyperplasia (enlarged endometrium). The risk of uterine cancer is  higher if a woman has endometrial hyperplasia.  Hormone replacement therapy (HRT). HRT is used to control the symptoms of menopause, to prevent osteoporosis (thinning of the bones), and to reduce the risk of heart disease or stroke. Women who still have their uterus, and use estrogen without progesterone, have an increased risk of uterine cancer. Long-term use and large doses of estrogen seem to increase this risk. Women who use a combination of estrogen and progesterone have a lower risk of uterine cancer than women who use estrogen alone. The progesterone protects the uterus from developing cancer.  Obesity and related conditions. The body stores and releases some of its estrogen in fatty tissue. That is why obese women are more likely than thin women to have higher levels of estrogen in their bodies. High levels of estrogen may be the reason that obese women have an increased risk of developing uterine cancer. The risk of this disease is also higher in women with diabetes or high blood pressure. These conditions occur in many obese women.  Tamoxifen. Women taking the drug tamoxifen to prevent or treat breast cancer have an increased risk of uterine cancer. This risk appears to be related to the estrogen-like effect of this drug on the uterus.  Race. White women are more likely than African-American women to get uterine cancer.  Colorectal cancer. Women who have had an inherited form of colorectal cancer have a higher risk of developing uterine cancer than other women.  Infertility.  Beginning menstrual   periods before age 12.  Having menstrual periods after age 52.  History of cancer of the ovary or intestine.  Family history of uterine cancer.  Having diabetes, high blood pressure, thyroid or gallbladder disease.  Long-term use of high does of birth control pills. Birth control pills today are low in hormone doses.  Radiation to the abdomen or pelvis.  Smoking. SYMPTOMS  Uterine  cancer usually occurs after menopause. But it may also occur around the time that menopause begins. Abnormal vaginal bleeding is the most common symptom of uterine cancer. Bleeding may start as a watery, blood-streaked flow that gradually contains more blood. Women should not assume that abnormal vaginal bleeding is part of menopause. A woman should see her caregiver if she has any of the following symptoms:  Unusual vaginal bleeding or discharge.  Difficult or painful urination.  Pain during intercourse.  Pain in the pelvic area.  Increased girth (growth) of the stomach.  Any vaginal bleeding after menopause.  Unexplained weight loss. These symptoms can be caused by cancer or other less serious conditions. Most often they are not cancer. But a thorough evaluation is needed to be certain. DIAGNOSIS  If a woman has symptoms that suggest uterine cancer, her caregiver may check her general health and may order blood and urine tests. The caregiver also may perform one or more of these exams or tests.  Blood and urine tests and chest x-rays. The woman also may have:  Other X-rays.  CT scans.  Ultrasound test.  Magnetic resonance imaging (MRI).  Sigmoidoscopy.  Colonoscopy.  Pelvic exam. A woman will have a pelvic exam to check the vagina, uterus, bladder, and rectum. The caregiver feels these organs for any lumps or changes in their shape or size. To see the upper part of the vagina and the cervix, the caregiver inserts an instrument called a speculum into the vagina.  Pap test. The caregiver collects cells from the cervix and upper vagina. A medical laboratory checks for abnormal cells. The Pap test is better for detecting cancer of the cervix. But cells from inside the uterus usually do not show up on a Pap test. It is not a reliable test for uterine cancer.  Transvaginal ultrasound. The medical caregiver inserts an instrument into the vagina. The instrument aims high-frequency  sound waves at the uterus. The pattern of the echoes they produce creates a picture. If the endometrium looks too thick, the caregiver can do a biopsy.  Biopsy. The medical caregiver removes a sample of tissue from the uterine lining. This usually can be done in the caregiver's office.  Dilatation and Curettage (D&C). In some cases, a woman may need to have a D&C. D&C is usually done as same-day surgery with anesthesia in a hospital. A pathologist examines the tissue (lining of the uterus) to check for cancer cells and other conditions. STAGING   If uterine cancer is diagnosed, the caregiver needs to know the stage, or extent, of the disease to plan the best treatment. Staging is a careful attempt to find out whether the cancer has spread, and if so, to what parts of the body.  When uterine cancer spreads (metastasizes) outside the uterus, cancer cells are often found in nearby lymph nodes, nerves, or blood vessels. If the cancer has reached the lymph nodes, cancer cells may have spread to other lymph nodes and other organs of the body.  Staging is done at the time of surgery. In most cases, the most reliable way to stage   this disease is to remove the uterus, cervix, tubes, ovaries, and lymph nodes. A pathologist uses a microscope to examine the uterus and other tissues removed by the surgeon, to determine the extent of the cancer in the pelvis.  If lymph nodes have cancer cells, other parts of the body are examined, to see if it has spread to other organs. MAIN FEATURES OF EACH STAGE OF THE DISEASE: Stage I. The cancer is only in the body of the uterus. It is not in the cervix. Stage II. The cancer has spread from the body of the uterus to the cervix. Stage III. The cancer has spread outside the uterus, but not outside the pelvis (and not to the bladder or rectum). Lymph nodes in the pelvis may contain cancer cells. Stage IV. The cancer has spread into the bladder or rectum. It may have spread  beyond the pelvis to other body parts. TREATMENT  Women with uterine cancer have many treatment options. Most women with uterine cancer are treated with surgery. Some have radiation or chemotherapy. A smaller number of women may be treated with hormonal therapy. Some patients receive a combination of therapies. You may want to consult with another cancer doctor for a second opinion. The caregiver (usually a cancer doctor) is the best person to describe your treatment choices and to discuss the expected results of treatment. SURGERY  Most women with uterine cancer have surgery to remove the uterus, cervix, tubes, and ovaries (total hysterectomy). This is usually done through an incision in the abdomen.  The doctor may also remove the lymph nodes near the tumor, to see if they contain cancer. If cancer cells have reached the lymph nodes, it may mean that the disease has spread to other parts of the body. If cancer cells have not spread beyond the endometrium, the woman may not need to have any other treatment. The length of the hospital stay may vary from several days to a week. RADIATION THERAPY  In radiation therapy, high-energy rays are used to kill cancer cells. Like surgery, radiation therapy is a local therapy. It affects cancer cells only in the treated area.  Some women with Stage I, II, or III uterine cancer need both radiation therapy and surgery. They may have radiation before surgery to shrink the tumor, or after surgery to destroy any cancer cells that remain in the area. The doctor may suggest radiation treatments for the small number of women who cannot have surgery.  Doctors use two types of radiation therapy to treat uterine cancer:  External radiation. In external radiation therapy, a large machine outside the body is used to aim radiation at the tumor area. The woman usually does not stay overnight (outpatient) at the hospital or clinic, and receives external radiation 5 days a week  for several weeks. This schedule helps protect healthy cells and tissue by spreading out the total dose of radiation. No radioactive materials are put into the body for external radiation therapy.  Internal radiation. In internal radiation therapy, tiny tubes containing a radioactive substance are inserted through the vagina and cervix, into the uterus, and left in place for a few days. The woman stays in the hospital during this treatment. To protect others from radiation exposure, the patient may not be able to have visitors or may have visitors only for a short period of time while the implant is in place. Once the implant is removed, the woman has no radioactivity in her body.  Some patients need both   external and internal radiation therapies. CHEMOTHERAPY Chemotherapy is not usually used for endometrial cancer of the uterus. However, with sarcoma of the uterus or of the fibroid, it may be used in combination with surgery. Chemotherapy may also be used with recurring sarcoma, and in patients who cannot have surgery. HORMONE THERAPY Hormonal therapy involves substances that prevent cancer cells from multiplying or growing by attaching to hormone receptors. This causes changes in cancer cells. Before therapy begins, the caregiver may request a hormone receptor test. This special lab test of uterine tissue helps the caregiver learn if estrogen and progesterone receptors are present. If the tissue has receptors, the woman is more likely to respond to hormonal therapy.  Hormonal therapy is called a systemic therapy, because it can affect cancer cells throughout the body. Usually, hormonal therapy is a type of progesterone, taken as a pill or injection.  The doctor may use hormonal therapy for women with uterine cancer who are unable to have surgery or radiation therapy. Also, the doctor may give hormonal therapy to women with uterine cancer that has spread to the lungs or other distant sites. It is also  given to women with uterine cancer that has come back.  Hormonal therapy can cause a number of side effects. Women taking progesterone may retain fluid, have an increased appetite, and gain weight. Women who are still menstruating may have changes in their periods.  Hormone therapy can be used in combination with surgery or radiation. HOME CARE INSTRUCTIONS   Maintain a normal weight with a healthy balanced diet and exercise.  If you have diabetes, high blood pressure, thyroid or gallbladder disease, keep them in control with your caregiver's treatment and recommendations.  Do not smoke.  Do not take estrogen without taking progesterone with it, for menopausal symptoms.  Join a support group or get counseling, if you would like help dealing with your cancer.  If you are on hormone replacement therapy, see your caregiver as recommended, and be informed about the side effects of HRT.  Women with known risk factors should ask their caregiver what symptoms to look for and how often they should have an examination.  Keep your follow-up appointments and take your medicines as advised.  Write your questions down, and take them with you to your caregiver's appointments.  You may want another person to be with you for your appointments, so you do not miss any instructions. SEEK MEDICAL CARE IF:   You have any abnormal vaginal bleeding.  You are having menstrual periods at the age of 52 or older.  You have bleeding after sexual intercourse.  You are taking tomoxifen and develop vaginal bleeding.  Your stomach is growing, and you are not pregnant.  You have pain with sexual intercourse.  You have stomach or pelvis pain.  You have weight loss for no known reason.  You have pain or difficulty with urination. [facility]  Cancer Information Service (CIS) provides accurate, up-to-date information on cancer to patients and their families, health professionals, and  the general public:  Phone: 1-800-4-CANCER (1-800-422-6237).  Internet: http://www.cancer.gov NCI's website contains complete information about cancer causes and prevention, screening and diagnosis, treatment and survivorship, clinical trials, statistics, funding, training, and employment opportunities, and the Institute and its programs. CLINICAL TRIALS A woman who is interested in being part of a clinical trial should talk with her caregiver. NCI's website (http://www.cancer.gov) provides general information about clinical trials. It also offers detailed information about specific ongoing studies of uterine   cancer by linking to PDQ, a cancer information database developed by the NCI. The Cancer Information Service at 1-800-4-CANCER can answer questions about cancer and provide information from the PDQ database. Document Released: 11/12/2005 Document Revised: 02/04/2012 Document Reviewed: 09/15/2009 [facility] Patient Information 2013 [place], [place].   Dr. Rhunette Croft [facility] [address] [place], [place]  Nov 7th @1pm  [phone]

## 2012-10-02 ENCOUNTER — Ambulatory Visit (HOSPITAL_COMMUNITY)
Admission: RE | Admit: 2012-10-02 | Discharge: 2012-10-02 | Disposition: A | Discharge status: Home / Self Care | Payer: Medicaid Other | Source: Ambulatory Visit | Attending: Gynecologic Oncology | Admitting: Gynecologic Oncology

## 2012-10-02 ENCOUNTER — Encounter: Payer: Self-pay | Admitting: Gynecologic Oncology

## 2012-10-02 ENCOUNTER — Encounter: Payer: Self-pay | Admitting: Internal Medicine

## 2012-10-02 ENCOUNTER — Ambulatory Visit: Payer: Medicaid Other | Attending: Gynecologic Oncology | Admitting: Gynecologic Oncology

## 2012-10-02 ENCOUNTER — Other Ambulatory Visit: Payer: Self-pay | Admitting: Gynecologic Oncology

## 2012-10-02 VITALS — BP 110/78 | HR 68 | Temp 99.0°F | Resp 22 | Ht 66.0 in | Wt 296.8 lb

## 2012-10-02 DIAGNOSIS — C541 Malignant neoplasm of endometrium: Secondary | ICD-10-CM

## 2012-10-02 DIAGNOSIS — Z833 Family history of diabetes mellitus: Secondary | ICD-10-CM | POA: Insufficient documentation

## 2012-10-02 DIAGNOSIS — C549 Malignant neoplasm of corpus uteri, unspecified: Secondary | ICD-10-CM | POA: Insufficient documentation

## 2012-10-02 DIAGNOSIS — Z8249 Family history of ischemic heart disease and other diseases of the circulatory system: Secondary | ICD-10-CM | POA: Insufficient documentation

## 2012-10-02 DIAGNOSIS — Z801 Family history of malignant neoplasm of trachea, bronchus and lung: Secondary | ICD-10-CM | POA: Insufficient documentation

## 2012-10-02 DIAGNOSIS — Z800 Family history of malignant neoplasm of digestive organs: Secondary | ICD-10-CM | POA: Insufficient documentation

## 2012-10-02 DIAGNOSIS — Z7982 Long term (current) use of aspirin: Secondary | ICD-10-CM | POA: Insufficient documentation

## 2012-10-02 NOTE — Progress Notes (Signed)
Consult Note: Gyn-Onc  Consult was requested by Dr. Erin Fulling for the evaluation of Deborah [name] 46 y.o. female  CC:  Chief Complaint  Patient presents with  . Endometrial  adenocarcinoma    New consult    HPI:46 y/o G2 P1 LNMP  06/2012.  Patient reports intermittent spotting between menses for over 1 year.  Occasional menorrhagia.  Last pap more than two years prior.  20 pound weight loss over the past year.  [name] was referred to Dr. Marice Potter who collected an endometrial biopsy and a endocervical biopsy. Both are positive for endometrial adenocarcinoma.   Current Meds:  Outpatient Encounter Prescriptions as of 10/02/2012  Medication Sig Dispense Refill  . aspirin 81 MG tablet Take 81 mg by mouth daily.        Clinical research associate Bandages & Supports (WRIST SPLINT/COCK-UP/LEFT M) MISC 1 Device by Does not apply route once.  1 each  0  . furosemide (LASIX) 20 MG tablet Take 1 tablet (20 mg total) by mouth daily.  30 tablet  5  . naproxen (NAPROSYN) 500 MG tablet Take 1 tablet (500 mg total) by mouth 2 (two) times daily with a meal.  60 tablet  2  . sertraline (ZOLOFT) 100 MG tablet Take 1 tablet (100 mg total) by mouth daily.  30 tablet  5    Allergy: No Known Allergies  Social Hx:   History   Social History  . Marital Status: Single    Spouse Name: N/A    Number of Children: N/A  . Years of Education: N/A   Occupational History  . Not on file.   Social History Main Topics  . Smoking status: Never Smoker   . Smokeless tobacco: Never Used  . Alcohol Use: No  . Drug Use: No  . Sexually Active: Not Currently    Birth Control/ Protection: Condom   Other Topics Concern  . Not on file   Social History Narrative  . No narrative on file    Past Surgical Hx:  Past Surgical History  Procedure Date  . Hernia repair     Past Medical Hx:  Past Medical History  Diagnosis Date  . Congenital birth defect     Right hand  . Anemia     Past Gynecological History:  G2 P1  Menarche 82, Patient's last menstrual period was 09/20/2012. No h/o abn pap test  Family Hx:  Family History  Problem Relation Age of Onset  . Cancer Maternal Grandfather   . Hypertension Maternal Grandmother   . Diabetes Mother    Mat aunt lung cancer Dx 28 (tob use) Mat GF pancreatic cancer Mat cousin lung cancer dx 66's (tob use)  Review of Systems:  Constitutional  Feels well, depressed about the diagnosis of endometrial cancer.  Reports occasional hot flashes Cardiovascular  No chest pain, shortness of breath, or edema  Pulmonary  No cough or wheeze.  Gastro Intestinal  No nausea, vomitting, or diarrhoea. No bright red blood per rectum, no abdominal pain, change in bowel movement, or constipation.  Genito Urinary  No frequency, urgency, dysuria, reports vaginal spotting Musculo Skeletal  No myalgia, arthralgia, joint swelling or pain  Neurologic  No weakness, numbness, change in gait,  Psychology  No depression, anxiety, insomnia.   Vitals:  Blood pressure 110/78, pulse 68, temperature 99 F (37.2 C), temperature source Oral, resp. rate 22, height 5\' 6"  (1.676 m), weight 296 lb 12.8 oz (134.628 kg), last menstrual period 09/20/2012.Body mass index is  47.90 kg/(m^2).   Physical Exam: WD in NAD Neck  Supple NROM, without any enlargements.  Lymph Node Survey No cervical supraclavicular or inguinal adenopathy Cardiovascular  Pulse normal rate, regularity and rhythm. S1 and S2 normal.  Lungs  Clear to auscultation bilateraly, without wheezes/crackles/rhonchi. Good air movement.  Skin  No rash/lesions/breakdown  Psychiatry  Alert and oriented to person, place, and time  Abdomen  Normoactive bowel sounds, abdomen soft, non-tender and morbidly obese. Midline supraumbilical surgical  site intact without evidence of hernia.  Back No CVA tenderness Genito Urinary  Vulva/vagina: Normal external female genitalia.  No lesions.    Bladder/urethra:  No lesions or  masses  Vagina: Estrogenized, vaginal lesions  Cervix: cervix approximately 3 cm no lesions appreciated soft no parametrial nodularity  Uterus:  Unable to assess because of her body habitus Adnexa: Unable to assess because of body habitus Rectal  Good tone, no masses no cul de sac nodularity.  Extremities  No bilateral cyanosis, clubbing or edema.  Very large thighs and legs   Assessment/Plan:  [name]  is a 46 y.o.  year old with grade 2 endometrial adenocarcinoma noted in the endometrial biopsy and endocervical curettage specimens. On physical examination no gross lesions are appreciated in the cervix and the cervix is small. The patient's obesity is both central and in the thighs and hips. I doubt that para-aortic lymph node dissection would be feasible either open or with a minimally invasive approach. The only possibility for sampling of pelvic lymph nodes would be if a minimally invasive approach for her to be successful. Recommendation is for robotic assisted laparoscopic hysterectomy bilateral salpingo-oophorectomy bilateral pelvic lymph node dissection. The uterine size has not been previously characterized as such a pelvic ultrasound has been ordered to determine whether or not it is small enough to be delivered vaginally.  The patient is aware that even though the approach may start laparoscopically it is possible that laparotomy may need to be performed to safely complete of the surgical procedure. The risks of hysterectomy and pelvic lymph node dissection shared with the patient were that of infection bleeding damage to surrounding structures prolonged hospitalization and reoperation.  A pelvic ultrasound has been ordered and results are pending. If the uterus is significantly large an exploratory laparotomy will performed.    The procedure is scheduled for Tuesday, 10/07/2012. Lovenox will be administered preoperatively.   Laurette Schimke, MD, PhD 10/02/2012, 2:12 PM

## 2012-10-02 NOTE — Patient Instructions (Addendum)
A pelvic ultrasound has been ordered and results are pending. If the uterus is significantly large an exploratory laparotomy will performed.    The robotic assisted laparoscopic hysterectomy bilateral salpingo-oophorectomy bilateral pelvic lymph node dissection is scheduled for Tuesday, 10/07/2012. Lovenox will be administered preoperatively.   Thank you very much Deborah [name] for allowing me to provide care for you today.  I appreciate your confidence in choosing our Gynecologic Oncology team.  If you have any questions about your visit today please call our office and we will get back to you as soon as possible.  [name]. Donterius Filley MD., PhD Gynecologic Oncology

## 2012-10-03 ENCOUNTER — Ambulatory Visit: Payer: Medicaid Other | Admitting: Obstetrics & Gynecology

## 2012-10-03 ENCOUNTER — Ambulatory Visit (INDEPENDENT_AMBULATORY_CARE_PROVIDER_SITE_OTHER): Payer: Medicaid Other | Admitting: Internal Medicine

## 2012-10-03 ENCOUNTER — Encounter (HOSPITAL_COMMUNITY): Payer: Self-pay

## 2012-10-03 ENCOUNTER — Encounter: Payer: Self-pay | Admitting: Internal Medicine

## 2012-10-03 ENCOUNTER — Ambulatory Visit (HOSPITAL_COMMUNITY)
Admission: RE | Admit: 2012-10-03 | Discharge: 2012-10-03 | Disposition: A | Discharge status: Home / Self Care | Payer: Medicaid Other | Source: Ambulatory Visit | Attending: Gynecologic Oncology | Admitting: Gynecologic Oncology

## 2012-10-03 ENCOUNTER — Encounter (HOSPITAL_COMMUNITY)
Admission: RE | Admit: 2012-10-03 | Discharge: 2012-10-03 | Disposition: A | Discharge status: Home / Self Care | Payer: Medicaid Other | Source: Ambulatory Visit | Attending: Obstetrics & Gynecology | Admitting: Obstetrics & Gynecology

## 2012-10-03 VITALS — BP 120/90 | HR 100 | Ht 65.5 in | Wt 295.0 lb

## 2012-10-03 DIAGNOSIS — R143 Flatulence: Secondary | ICD-10-CM

## 2012-10-03 DIAGNOSIS — Z01812 Encounter for preprocedural laboratory examination: Secondary | ICD-10-CM | POA: Insufficient documentation

## 2012-10-03 DIAGNOSIS — R198 Other specified symptoms and signs involving the digestive system and abdomen: Secondary | ICD-10-CM

## 2012-10-03 DIAGNOSIS — Z01818 Encounter for other preprocedural examination: Secondary | ICD-10-CM | POA: Insufficient documentation

## 2012-10-03 DIAGNOSIS — C541 Malignant neoplasm of endometrium: Secondary | ICD-10-CM

## 2012-10-03 DIAGNOSIS — C549 Malignant neoplasm of corpus uteri, unspecified: Secondary | ICD-10-CM

## 2012-10-03 DIAGNOSIS — R140 Abdominal distension (gaseous): Secondary | ICD-10-CM

## 2012-10-03 DIAGNOSIS — R194 Change in bowel habit: Secondary | ICD-10-CM

## 2012-10-03 DIAGNOSIS — K625 Hemorrhage of anus and rectum: Secondary | ICD-10-CM

## 2012-10-03 HISTORY — DX: Edema, unspecified: R60.9

## 2012-10-03 HISTORY — DX: Unspecified osteoarthritis, unspecified site: M19.90

## 2012-10-03 HISTORY — DX: Anxiety disorder, unspecified: F41.9

## 2012-10-03 LAB — SURGICAL PCR SCREEN
MRSA, PCR: NEGATIVE
Staphylococcus aureus: POSITIVE — AB

## 2012-10-03 LAB — COMPREHENSIVE METABOLIC PANEL
ALT: 14 U/L (ref 0–35)
AST: 15 U/L (ref 0–37)
Albumin: 3.5 g/dL (ref 3.5–5.2)
CO2: 28 mEq/L (ref 19–32)
Chloride: 99 mEq/L (ref 96–112)
GFR calc non Af Amer: >90 mL/min (ref >90)
Potassium: 3.7 mEq/L (ref 3.5–5.1)
Sodium: 137 mEq/L (ref 135–145)
Total Bilirubin: 0.4 mg/dL (ref 0.3–1.2)

## 2012-10-03 LAB — CBC WITH DIFFERENTIAL/PLATELET
Basophils Absolute: 0 10*3/uL (ref 0.0–0.1)
Basophils Relative: 0 % (ref 0–1)
HCT: 41.5 % (ref 36.0–46.0)
Lymphocytes Relative: 37 % (ref 12–46)
MCHC: 31.8 g/dL (ref 30.0–36.0)
Neutro Abs: 2.2 10*3/uL (ref 1.7–7.7)
Neutrophils Relative %: 49 % (ref 43–77)
Platelets: 291 10*3/uL (ref 150–400)
RDW: 16.1 % — ABNORMAL HIGH (ref 11.5–15.5)
WBC: 4.5 10*3/uL (ref 4.0–10.5)

## 2012-10-03 LAB — TYPE AND SCREEN: Antibody Screen: NEGATIVE

## 2012-10-03 LAB — ABO/RH: ABO/RH(D): O POS

## 2012-10-03 MED ORDER — ALIGN PO CAPS: 1.0000 | ORAL_CAPSULE | Freq: Every day | ORAL | Status: DC

## 2012-10-03 MED ORDER — LACTASE 3000 UNITS PO TABS: 1.0000 | ORAL_TABLET | Freq: Three times a day (TID) | ORAL | Status: DC

## 2012-10-03 MED ORDER — PEG-KCL-NACL-NASULF-NA ASC-C 100 G PO SOLR: 1.0000 | Freq: Once | ORAL | Status: DC

## 2012-10-03 NOTE — Progress Notes (Signed)
Patient ID: Deborah [name], female   DOB: 1965/12/10, 46 y.o.   MRN: [id number]  SUBJECTIVE: HPI Deborah [name] is a 46 year old female with a recent diagnosis of endometrial cancer with plans for a total hysterectomy next Tuesday, anemia, anxiety and depression, obesity and borderline hyperlipidemia who is seen in consultation at the request of Dr. Madolyn Frieze for evaluation of intermittent rectal bleeding. The patient states that on several occasions over the last few months she is seeing bright red blood on the stool and on the toilet tissue. She reports a history of constipation which is predominant for her, but she also has periods with loose stools. She feels that she has lactose intolerance as dairy products will give her gas, bloating, and loose stools. She also reports occasional tenesmus, which seems to be more prevalent during her episodes of constipation. She reports some mild nausea but no vomiting. Gas and bloating are a frequent issue for her and she has used simethicone with some benefit. She denies heartburn, dysphagia, odynophagia.  No unintentional weight loss. She is maintained on iron supplementation and has had some dysfunctional uterine bleeding. Her recent endometrial biopsy revealed adenocarcinoma and thus plans for hysterectomy.  Review of Systems  As per history of present illness, otherwise negative   Past Medical History  Diagnosis Date  . Congenital birth defect     Right hand  . Anemia   . Arthritis     knees  . Endometrial cancer   . Anxiety   . Swelling     ANKLES - TAKES LASIX  . Depression   . HLD (hyperlipidemia)     borderline  . Obesity     Current Outpatient Prescriptions  Medication Sig Dispense Refill  . aspirin 81 MG tablet Take 81 mg by mouth every morning.       . furosemide (LASIX) 20 MG tablet Take 20 mg by mouth every morning.      . naproxen (NAPROSYN) 500 MG tablet Take 1 tablet (500 mg total) by mouth 2 (two) times daily with a meal.  60 tablet   2  . sertraline (ZOLOFT) 100 MG tablet Take 100 mg by mouth every evening.      . [DISCONTINUED] furosemide (LASIX) 20 MG tablet Take 1 tablet (20 mg total) by mouth daily.  30 tablet  5  . [DISCONTINUED] sertraline (ZOLOFT) 100 MG tablet Take 1 tablet (100 mg total) by mouth daily.  30 tablet  5  . bifidobacterium infantis (ALIGN) capsule Take 1 capsule by mouth daily.  14 capsule  0  . lactase (LACTAID) 3000 UNITS tablet Take 1 tablet (3,000 Units total) by mouth 3 (three) times daily with meals.  30 tablet  1  . peg 3350 powder (MOVIPREP) 100 G SOLR Take 1 kit (100 g total) by mouth once.  1 kit  0    No Known Allergies  Family History  Problem Relation Age of Onset  . Pancreatic cancer Maternal Grandfather     or liver cancer  . Hypertension Maternal Grandmother   . Diabetes Mother   . Colon polyps Maternal Aunt   . Colon cancer Cousin   . Lung cancer Maternal Aunt   . Hypertension Maternal Grandfather     History  Substance Use Topics  . Smoking status: Never Smoker   . Smokeless tobacco: Never Used  . Alcohol Use: Yes     Comment: rare    OBJECTIVE: BP 120/90  Pulse 100  Ht 5' 5.5" (1.664 m)  Wt 295 lb (133.811 kg)  BMI 48.34 kg/m2  LMP 09/20/2012 Constitutional: Well-developed and well-nourished. No distress. HEENT: Normocephalic and atraumatic. Oropharynx is clear and moist. No oropharyngeal exudate. Conjunctivae are normal. No scleral icterus. Cardiovascular: Normal rate, regular rhythm and intact distal pulses. No M/R/G Pulmonary/chest: Effort normal and breath sounds normal. No wheezing, rales or rhonchi. Abdominal: Soft, obese, nontender, nondistended. Bowel sounds active throughout. Extremities: no clubbing, cyanosis, no obvious edema, congenital defect right hand Lymphadenopathy: No cervical adenopathy noted. Neurological: Alert and oriented to person place and time. Skin: Skin is warm and dry. No rashes noted. Psychiatric: Normal mood and affect.  Behavior is normal.  Labs and Imaging -- CBC    Component Value Date/Time   WBC 4.5 10/03/2012 1020   RBC 4.82 10/03/2012 1020   HGB 13.2 10/03/2012 1020   HCT 41.5 10/03/2012 1020   PLT 291 10/03/2012 1020   MCV 86.1 10/03/2012 1020   MCH 27.4 10/03/2012 1020   MCHC 31.8 10/03/2012 1020   RDW 16.1* 10/03/2012 1020   LYMPHSABS 1.7 10/03/2012 1020   MONOABS 0.5 10/03/2012 1020   EOSABS 0.1 10/03/2012 1020   BASOSABS 0.0 10/03/2012 1020    CMP     Component Value Date/Time   NA 137 10/03/2012 1020   K 3.7 10/03/2012 1020   CL 99 10/03/2012 1020   CO2 28 10/03/2012 1020   GLUCOSE 98 10/03/2012 1020   BUN 13 10/03/2012 1020   CREATININE 0.72 10/03/2012 1020   CREATININE 0.77 07/18/2011 1053   CALCIUM 9.5 10/03/2012 1020   PROT 8.1 10/03/2012 1020   ALBUMIN 3.5 10/03/2012 1020   AST 15 10/03/2012 1020   ALT 14 10/03/2012 1020   ALKPHOS 74 10/03/2012 1020   BILITOT 0.4 10/03/2012 1020   GFRNONAA >90 10/03/2012 1020   GFRAA >90 10/03/2012 1020   Clinical Data: New diagnosis of endometrial carcinoma.   TRANSABDOMINAL AND TRANSVAGINAL ULTRASOUND OF PELVIS -- 10/02/2012 Technique:  Both transabdominal and transvaginal ultrasound examinations of the pelvis were performed. Transabdominal technique was performed for global imaging of the pelvis including uterus, ovaries, adnexal regions, and pelvic cul-de-sac.   It was necessary to proceed with endovaginal exam following the transabdominal exam to visualize the details of the endometrium.   Comparison:  Ultrasound dated 05/25/2004   Findings:   Uterus: 9.2 x 5.1 x 5.5 cm.  There are several Nabothian cysts.   Endometrium: Heterogeneous.  18.2 cm in thickness.   Right ovary:  Normal.  1.8 x 1.5 x 1.5 cm.   Left ovary: Normal.  2.2 x 1.6 x 1.4 cm.   Other findings: There are small oblong fluid collections in both adnexal regions which may represent hydrosalpinges.  No free fluid in the pelvis.   IMPRESSION: Abnormal thickening of the  endometrium.  Possible bilateral hydrosalpinges.   The size of the uterus on 08/25/2004 was 8.4 x 3.1 x 6.1 cm. Overall uterine size has minimally changed since the prior exam.    ASSESSMENT AND PLAN: 46 year old female with a recent diagnosis of endometrial cancer with plans for a total hysterectomy next Tuesday, anemia, anxiety and depression, obesity and borderline hyperlipidemia who is seen in consultation at the request of Dr. Madolyn Frieze for evaluation of intermittent rectal bleeding.  1.  Rectal bleeding/constipation alternating with loose stools/abd bloating -- some of the patient's symptoms seem irritable in nature, however rectal bleeding certainly has not. I recommended direct visualization with colonoscopy, but this will likely need to be delayed for 3-4 weeks  given her upcoming hysterectomy for endometrial adenocarcinoma. There is no evidence of metastatic disease at present. For her bloating I recommended a trial of Align one capsule daily. He also sounds like she has lactose intolerance and if she is eating a lactose containing female I've advised she use over-the-counter Lactaid.  She can continue to use simethicone as needed and as directed for bloating and gas. Further recommendations to be made after colonoscopy. Colonoscopy will likely be scheduled for mid December, but she is advised to call us back should her rectal bleeding increase or become more frequent.

## 2012-10-03 NOTE — Patient Instructions (Addendum)
You have been scheduled for a colonoscopy with propofol. Please follow written instructions given to you at your visit today.  Please pick up your prep kit at the pharmacy within the next 1-3 days. If you use inhalers (even only as needed) or a CPAP machine, please bring them with you on the day of your procedure.   We have sent the following medications to your pharmacy for you to pick up at your convenience: lactaid; please take as directed  You have been given an anti-bloating diet.

## 2012-10-03 NOTE — Patient Instructions (Addendum)
TAMELA ELSAYED  10/03/2012                           YOUR PROCEDURE IS SCHEDULED ON:  10/07/12 AT 2:00 PM               PLEASE REPORT TO SHORT STAY CENTER AT :  11:30  AM                CALL THIS NUMBER IF ANY PROBLEMS THE DAY OF SURGERY :               [phone]                      REMEMBER:              STAY ON CLEAR LIQUIDS FOR 24 HRS BEFORE SURGERY  Do not eat food  AFTER MIDNIGHT  May have clear liquids UNTIL 6 HOURS BEFORE SURGERY  (8:00 AM)  Clear liquids include soda, tea, black coffee, apple or grape juice, broth.  Take these medicines the morning of surgery with A SIP OF WATER: NONE   Do not wear jewelry, make-up   Do not wear lotions, powders, or perfumes.   Do not shave legs or underarms 12 hrs. before surgery (men may shave face)  Do not bring valuables to the hospital.  Contacts, dentures or bridgework may not be worn into surgery.  Leave suitcase in the car. After surgery it may be brought to your room.  For patients admitted to the hospital more than one night, checkout time is 11:00                          The day of discharge.   Patients discharged the day of surgery will not be allowed to drive home                             If going home same day of surgery, must have someone stay with you first                           24 hrs at home and arrange for some one to drive you home from hospital.    Special Instructions:   Please read over the following fact sheets that you were given:               1. MRSA  INFORMATION                      2. [place] PREPARING FOR SURGERY SHEET               3. INCENTIVE SPIROMETER                                              X_____________________________________________________________________

## 2012-10-07 ENCOUNTER — Encounter (HOSPITAL_COMMUNITY): Payer: Self-pay | Admitting: *Deleted

## 2012-10-07 ENCOUNTER — Ambulatory Visit (HOSPITAL_COMMUNITY): Payer: Medicaid Other | Admitting: Registered Nurse

## 2012-10-07 ENCOUNTER — Encounter (HOSPITAL_COMMUNITY): Payer: Self-pay | Admitting: Anesthesiology

## 2012-10-07 ENCOUNTER — Encounter (HOSPITAL_COMMUNITY)
Admission: RE | Disposition: A | Discharge status: Home / Self Care | Payer: Self-pay | Source: Ambulatory Visit | Attending: Obstetrics & Gynecology

## 2012-10-07 ENCOUNTER — Ambulatory Visit (HOSPITAL_COMMUNITY)
Admission: RE | Admit: 2012-10-07 | Discharge: 2012-10-08 | Disposition: A | Discharge status: Home / Self Care | Payer: Medicaid Other | Source: Ambulatory Visit | Attending: Obstetrics & Gynecology | Admitting: Obstetrics & Gynecology

## 2012-10-07 ENCOUNTER — Encounter (HOSPITAL_COMMUNITY): Payer: Self-pay | Admitting: Registered Nurse

## 2012-10-07 ENCOUNTER — Ambulatory Visit (HOSPITAL_COMMUNITY): Payer: Medicaid Other | Admitting: Anesthesiology

## 2012-10-07 DIAGNOSIS — T8132XA Disruption of internal operation (surgical) wound, not elsewhere classified, initial encounter: Secondary | ICD-10-CM | POA: Insufficient documentation

## 2012-10-07 DIAGNOSIS — Z8542 Personal history of malignant neoplasm of other parts of uterus: Secondary | ICD-10-CM | POA: Diagnosis present

## 2012-10-07 DIAGNOSIS — Z79899 Other long term (current) drug therapy: Secondary | ICD-10-CM | POA: Insufficient documentation

## 2012-10-07 DIAGNOSIS — K625 Hemorrhage of anus and rectum: Secondary | ICD-10-CM

## 2012-10-07 DIAGNOSIS — Y836 Removal of other organ (partial) (total) as the cause of abnormal reaction of the patient, or of later complication, without mention of misadventure at the time of the procedure: Secondary | ICD-10-CM | POA: Insufficient documentation

## 2012-10-07 DIAGNOSIS — Y9210 Unspecified residential institution as the place of occurrence of the external cause: Secondary | ICD-10-CM | POA: Insufficient documentation

## 2012-10-07 DIAGNOSIS — R194 Change in bowel habit: Secondary | ICD-10-CM

## 2012-10-07 DIAGNOSIS — C541 Malignant neoplasm of endometrium: Secondary | ICD-10-CM

## 2012-10-07 DIAGNOSIS — Z7982 Long term (current) use of aspirin: Secondary | ICD-10-CM | POA: Insufficient documentation

## 2012-10-07 DIAGNOSIS — T81329A Deep disruption or dehiscence of operation wound, unspecified, initial encounter: Secondary | ICD-10-CM | POA: Insufficient documentation

## 2012-10-07 DIAGNOSIS — C785 Secondary malignant neoplasm of large intestine and rectum: Secondary | ICD-10-CM | POA: Insufficient documentation

## 2012-10-07 DIAGNOSIS — C549 Malignant neoplasm of corpus uteri, unspecified: Principal | ICD-10-CM | POA: Insufficient documentation

## 2012-10-07 DIAGNOSIS — IMO0002 Reserved for concepts with insufficient information to code with codable children: Secondary | ICD-10-CM | POA: Insufficient documentation

## 2012-10-07 HISTORY — PX: REPAIR VAGINAL CUFF: SHX6067

## 2012-10-07 HISTORY — PX: ROBOTIC ASSISTED TOTAL HYSTERECTOMY WITH BILATERAL SALPINGO OOPHERECTOMY: SHX6086

## 2012-10-07 SURGERY — REPAIR, VAGINAL CUFF
Anesthesia: General | Wound class: Clean Contaminated

## 2012-10-07 SURGERY — ROBOTIC ASSISTED TOTAL HYSTERECTOMY WITH BILATERAL SALPINGO OOPHORECTOMY
Anesthesia: General | Site: Pelvis | Laterality: Bilateral | Wound class: Clean Contaminated

## 2012-10-07 MED ORDER — ENOXAPARIN SODIUM 40 MG/0.4ML ~~LOC~~ SOLN
40.0000 mg | SUBCUTANEOUS | Status: AC
  Administered 2012-10-07: 40 mg via SUBCUTANEOUS
  Filled 2012-10-07: qty 0.4

## 2012-10-07 MED ORDER — CEFAZOLIN SODIUM-DEXTROSE 2-3 GM-% IV SOLR
3.0000 g | Freq: Once | INTRAVENOUS | Status: AC
  Administered 2012-10-07: 3 g via INTRAVENOUS
  Filled 2012-10-07: qty 100

## 2012-10-07 MED ORDER — LACTATED RINGERS IV SOLN
INTRAVENOUS | Status: DC | PRN
  Administered 2012-10-07: 13:00:00 via INTRAVENOUS

## 2012-10-07 MED ORDER — PROMETHAZINE HCL 25 MG/ML IJ SOLN
INTRAMUSCULAR | Status: AC
  Filled 2012-10-07: qty 1

## 2012-10-07 MED ORDER — ZOLPIDEM TARTRATE 5 MG PO TABS: 5.0000 mg | ORAL_TABLET | Freq: Every evening | ORAL | Status: DC | PRN

## 2012-10-07 MED ORDER — KCL IN DEXTROSE-NACL 20-5-0.45 MEQ/L-%-% IV SOLN
INTRAVENOUS | Status: DC
  Administered 2012-10-07: via INTRAVENOUS
  Filled 2012-10-07 (×3): qty 1000

## 2012-10-07 MED ORDER — CEFAZOLIN SODIUM-DEXTROSE 2-3 GM-% IV SOLR
INTRAVENOUS | Status: AC
  Filled 2012-10-07: qty 50

## 2012-10-07 MED ORDER — HYDROMORPHONE HCL PF 1 MG/ML IJ SOLN
INTRAMUSCULAR | Status: AC
  Filled 2012-10-07: qty 1

## 2012-10-07 MED ORDER — HYDROMORPHONE HCL PF 1 MG/ML IJ SOLN
0.2500 mg | INTRAMUSCULAR | Status: DC | PRN
  Administered 2012-10-07 (×3): 0.5 mg via INTRAVENOUS
  Administered 2012-10-07 (×2): 0.25 mg via INTRAVENOUS

## 2012-10-07 MED ORDER — KETOROLAC TROMETHAMINE 30 MG/ML IJ SOLN
30.0000 mg | Freq: Four times a day (QID) | INTRAMUSCULAR | Status: AC
  Filled 2012-10-07: qty 1

## 2012-10-07 MED ORDER — FENTANYL CITRATE 0.05 MG/ML IJ SOLN
INTRAMUSCULAR | Status: DC | PRN
  Administered 2012-10-07: 100 ug via INTRAVENOUS

## 2012-10-07 MED ORDER — LACTATED RINGERS IV SOLN
INTRAVENOUS | Status: DC
  Administered 2012-10-07 (×2): via INTRAVENOUS

## 2012-10-07 MED ORDER — LACTATED RINGERS IV SOLN: INTRAVENOUS | Status: DC

## 2012-10-07 MED ORDER — GLYCOPYRROLATE 0.2 MG/ML IJ SOLN
INTRAMUSCULAR | Status: DC | PRN
  Administered 2012-10-07: .5 mg via INTRAVENOUS

## 2012-10-07 MED ORDER — ONDANSETRON HCL 4 MG/2ML IJ SOLN
INTRAMUSCULAR | Status: DC | PRN
  Administered 2012-10-07: 4 mg via INTRAVENOUS

## 2012-10-07 MED ORDER — HYDROMORPHONE HCL PF 1 MG/ML IJ SOLN: 0.2500 mg | INTRAMUSCULAR | Status: DC | PRN

## 2012-10-07 MED ORDER — LACTASE 3000 UNITS PO TABS
1.0000 | ORAL_TABLET | Freq: Three times a day (TID) | ORAL | Status: DC
  Administered 2012-10-08: 3000 [IU] via ORAL
  Filled 2012-10-07 (×5): qty 1

## 2012-10-07 MED ORDER — RINGERS IRRIGATION IR SOLN
Status: DC | PRN
  Administered 2012-10-07: 400 mL

## 2012-10-07 MED ORDER — LIDOCAINE HCL (CARDIAC) 20 MG/ML IV SOLN
INTRAVENOUS | Status: DC | PRN
  Administered 2012-10-07: 75 mg via INTRAVENOUS

## 2012-10-07 MED ORDER — ACETAMINOPHEN 10 MG/ML IV SOLN
INTRAVENOUS | Status: AC
  Filled 2012-10-07: qty 100

## 2012-10-07 MED ORDER — ONDANSETRON HCL 4 MG PO TABS: 4.0000 mg | ORAL_TABLET | Freq: Four times a day (QID) | ORAL | Status: DC | PRN

## 2012-10-07 MED ORDER — OXYCODONE-ACETAMINOPHEN 5-325 MG PO TABS
1.0000 | ORAL_TABLET | ORAL | Status: DC | PRN
  Administered 2012-10-08: 2 via ORAL
  Filled 2012-10-07: qty 2

## 2012-10-07 MED ORDER — SUCCINYLCHOLINE CHLORIDE 20 MG/ML IJ SOLN
INTRAMUSCULAR | Status: DC | PRN
  Administered 2012-10-07 (×2): 100 mg via INTRAVENOUS

## 2012-10-07 MED ORDER — 0.9 % SODIUM CHLORIDE (POUR BTL) OPTIME
TOPICAL | Status: DC | PRN
  Administered 2012-10-07: 1000 mL

## 2012-10-07 MED ORDER — ACETAMINOPHEN 10 MG/ML IV SOLN
INTRAVENOUS | Status: DC | PRN
  Administered 2012-10-07: 1000 mg via INTRAVENOUS

## 2012-10-07 MED ORDER — KETOROLAC TROMETHAMINE 30 MG/ML IJ SOLN
30.0000 mg | Freq: Four times a day (QID) | INTRAMUSCULAR | Status: AC
  Administered 2012-10-07 – 2012-10-08 (×3): 30 mg via INTRAVENOUS
  Filled 2012-10-07: qty 1

## 2012-10-07 MED ORDER — PROPOFOL 10 MG/ML IV BOLUS
INTRAVENOUS | Status: DC | PRN
  Administered 2012-10-07 (×2): 200 mg via INTRAVENOUS

## 2012-10-07 MED ORDER — PROMETHAZINE HCL 25 MG/ML IJ SOLN
6.2500 mg | INTRAMUSCULAR | Status: DC | PRN
  Administered 2012-10-07: 6.25 mg via INTRAVENOUS

## 2012-10-07 MED ORDER — KETOROLAC TROMETHAMINE 30 MG/ML IJ SOLN
INTRAMUSCULAR | Status: AC
  Filled 2012-10-07: qty 1

## 2012-10-07 MED ORDER — SUCCINYLCHOLINE CHLORIDE 20 MG/ML IJ SOLN
INTRAMUSCULAR | Status: DC | PRN
  Administered 2012-10-07: 100 mg via INTRAVENOUS

## 2012-10-07 MED ORDER — FENTANYL CITRATE 0.05 MG/ML IJ SOLN
INTRAMUSCULAR | Status: DC | PRN
  Administered 2012-10-07 (×2): 100 ug via INTRAVENOUS
  Administered 2012-10-07 (×3): 50 ug via INTRAVENOUS

## 2012-10-07 MED ORDER — MORPHINE SULFATE 2 MG/ML IJ SOLN: 2.0000 mg | INTRAMUSCULAR | Status: DC | PRN

## 2012-10-07 MED ORDER — LIDOCAINE HCL (CARDIAC) 20 MG/ML IV SOLN
INTRAVENOUS | Status: DC | PRN
  Administered 2012-10-07: 100 mg via INTRAVENOUS

## 2012-10-07 MED ORDER — PROPOFOL 10 MG/ML IV BOLUS
INTRAVENOUS | Status: DC | PRN
  Administered 2012-10-07: 50 mg via INTRAVENOUS
  Administered 2012-10-07: 250 mg via INTRAVENOUS

## 2012-10-07 MED ORDER — SERTRALINE HCL 100 MG PO TABS
100.0000 mg | ORAL_TABLET | Freq: Every evening | ORAL | Status: DC
  Filled 2012-10-07: qty 1

## 2012-10-07 MED ORDER — CEFAZOLIN SODIUM 1-5 GM-% IV SOLN
INTRAVENOUS | Status: AC
  Filled 2012-10-07: qty 50

## 2012-10-07 MED ORDER — ROCURONIUM BROMIDE 100 MG/10ML IV SOLN
INTRAVENOUS | Status: DC | PRN
  Administered 2012-10-07: 10 mg via INTRAVENOUS
  Administered 2012-10-07: 50 mg via INTRAVENOUS

## 2012-10-07 MED ORDER — MIDAZOLAM HCL 5 MG/5ML IJ SOLN
INTRAMUSCULAR | Status: DC | PRN
  Administered 2012-10-07: 2 mg via INTRAVENOUS

## 2012-10-07 MED ORDER — DEXAMETHASONE SODIUM PHOSPHATE 10 MG/ML IJ SOLN
INTRAMUSCULAR | Status: DC | PRN
  Administered 2012-10-07: 10 mg via INTRAVENOUS

## 2012-10-07 MED ORDER — NEOSTIGMINE METHYLSULFATE 1 MG/ML IJ SOLN
INTRAMUSCULAR | Status: DC | PRN
  Administered 2012-10-07: 4 mg via INTRAVENOUS

## 2012-10-07 MED ORDER — DEXTROSE 5 % IV SOLN
3.0000 g | INTRAVENOUS | Status: AC
  Administered 2012-10-07: 3 g via INTRAVENOUS

## 2012-10-07 MED ORDER — ONDANSETRON HCL 4 MG/2ML IJ SOLN: 4.0000 mg | Freq: Four times a day (QID) | INTRAMUSCULAR | Status: DC | PRN

## 2012-10-07 SURGICAL SUPPLY — 56 items
APL SKNCLS STERI-STRIP NONHPOA (GAUZE/BANDAGES/DRESSINGS)
BAG SPEC RTRVL LRG 6X4 10 (ENDOMECHANICALS) ×1
BENZOIN TINCTURE PRP APPL 2/3 (GAUZE/BANDAGES/DRESSINGS) ×1 IMPLANT
CHLORAPREP W/TINT 26ML (MISCELLANEOUS) ×3 IMPLANT
CLOTH BEACON ORANGE TIMEOUT ST (SAFETY) ×2 IMPLANT
CORDS BIPOLAR (ELECTRODE) ×2 IMPLANT
COVER MAYO STAND STRL (DRAPES) ×2 IMPLANT
COVER SURGICAL LIGHT HANDLE (MISCELLANEOUS) ×2 IMPLANT
COVER TIP SHEARS 8 DVNC (MISCELLANEOUS) ×1 IMPLANT
COVER TIP SHEARS 8MM DA VINCI (MISCELLANEOUS) ×1
DECANTER SPIKE VIAL GLASS SM (MISCELLANEOUS) ×2 IMPLANT
DRAPE LG THREE QUARTER DISP (DRAPES) ×4 IMPLANT
DRAPE SURG IRRIG POUCH 19X23 (DRAPES) ×2 IMPLANT
DRAPE TABLE BACK 44X90 PK DISP (DRAPES) ×4 IMPLANT
DRAPE UTILITY XL STRL (DRAPES) ×2 IMPLANT
DRAPE WARM FLUID 44X44 (DRAPE) ×2 IMPLANT
DRSG TEGADERM 2-3/8X2-3/4 SM (GAUZE/BANDAGES/DRESSINGS) ×5 IMPLANT
DRSG TEGADERM 6X8 (GAUZE/BANDAGES/DRESSINGS) ×4 IMPLANT
ELECT REM PT RETURN 9FT ADLT (ELECTROSURGICAL) ×2
ELECTRODE REM PT RTRN 9FT ADLT (ELECTROSURGICAL) ×1 IMPLANT
FILTER SMOKE EVAC LAPAROSHD (FILTER) ×1 IMPLANT
GAUZE VASELINE 3X9 (GAUZE/BANDAGES/DRESSINGS) IMPLANT
GLOVE BIO SURGEON STRL SZ 6.5 (GLOVE) ×8 IMPLANT
GLOVE BIO SURGEON STRL SZ7.5 (GLOVE) ×4 IMPLANT
GLOVE INDICATOR 8.0 STRL GRN (GLOVE) ×4 IMPLANT
GOWN PREVENTION PLUS XLARGE (GOWN DISPOSABLE) ×9 IMPLANT
GOWN STRL REIN XL XLG (GOWN DISPOSABLE) ×4 IMPLANT
HOLDER FOLEY CATH W/STRAP (MISCELLANEOUS) ×2 IMPLANT
KIT ACCESSORY DA VINCI DISP (KITS) ×1
KIT ACCESSORY DVNC DISP (KITS) ×1 IMPLANT
MANIPULATOR UTERINE 4.5 ZUMI (MISCELLANEOUS) ×2 IMPLANT
OCCLUDER COLPOPNEUMO (BALLOONS) ×2 IMPLANT
PACK LAPAROSCOPY W LONG (CUSTOM PROCEDURE TRAY) ×2 IMPLANT
POUCH SPECIMEN RETRIEVAL 10MM (ENDOMECHANICALS) ×3 IMPLANT
SET TUBE IRRIG SUCTION NO TIP (IRRIGATION / IRRIGATOR) ×2 IMPLANT
SHEET LAVH (DRAPES) ×2 IMPLANT
SOLUTION ELECTROLUBE (MISCELLANEOUS) ×2 IMPLANT
SPONGE LAP 18X18 X RAY DECT (DISPOSABLE) IMPLANT
STRIP CLOSURE SKIN 1/2X4 (GAUZE/BANDAGES/DRESSINGS) ×1 IMPLANT
SUT MNCRL 3 0 VIOLET RB1 (SUTURE) IMPLANT
SUT MNCRL AB 4-0 PS2 18 (SUTURE) ×3 IMPLANT
SUT MONOCRYL 3 0 RB1 (SUTURE) ×1
SUT VIC AB 0 CT1 27 (SUTURE) ×2
SUT VIC AB 0 CT1 27XBRD ANTBC (SUTURE) ×1 IMPLANT
SUT VIC AB 4-0 PS2 27 (SUTURE) ×4 IMPLANT
SUT VICRYL 0 UR6 27IN ABS (SUTURE) ×2 IMPLANT
SYR 50ML LL SCALE MARK (SYRINGE) ×2 IMPLANT
SYR BULB IRRIGATION 50ML (SYRINGE) IMPLANT
TOWEL OR 17X26 10 PK STRL BLUE (TOWEL DISPOSABLE) ×4 IMPLANT
TRAP SPECIMEN MUCOUS 40CC (MISCELLANEOUS) IMPLANT
TRAY FOLEY CATH 14FRSI W/METER (CATHETERS) ×2 IMPLANT
TROCAR 12M 150ML BLUNT (TROCAR) ×3 IMPLANT
TROCAR BLADELESS OPT 5 75 (ENDOMECHANICALS) ×2 IMPLANT
TROCAR ENDOPATH XCEL 12X100 BL (ENDOMECHANICALS) ×2 IMPLANT
TROCAR XCEL 12X100 BLDLESS (ENDOMECHANICALS) ×2 IMPLANT
WATER STERILE IRR 1500ML POUR (IV SOLUTION) ×4 IMPLANT

## 2012-10-07 SURGICAL SUPPLY — 19 items
CATH ROBINSON RED A/P 16FR (CATHETERS) ×1 IMPLANT
CLOTH BEACON ORANGE TIMEOUT ST (SAFETY) ×3 IMPLANT
COUNTER NEEDLE 20 DBL MAG RED (NEEDLE) ×2 IMPLANT
DRESSING TELFA 8X3 (GAUZE/BANDAGES/DRESSINGS) ×1 IMPLANT
ELECT REM PT RETURN 9FT ADLT (ELECTROSURGICAL) ×3
ELECTRODE REM PT RTRN 9FT ADLT (ELECTROSURGICAL) ×1 IMPLANT
GLOVE BIO SURGEON STRL SZ7.5 (GLOVE) ×6 IMPLANT
GLOVE INDICATOR 8.0 STRL GRN (GLOVE) ×3 IMPLANT
NDL SPNL 22GX3.5 QUINCKE BK (NEEDLE) ×1 IMPLANT
NEEDLE SPNL 22GX3.5 QUINCKE BK (NEEDLE) IMPLANT
PACK MINOR VAGINAL W LONG (CUSTOM PROCEDURE TRAY) ×3 IMPLANT
PENCIL BUTTON HOLSTER BLD 10FT (ELECTRODE) ×2 IMPLANT
SHEET LAVH (DRAPES) IMPLANT
SPONGE LAP 18X18 X RAY DECT (DISPOSABLE) ×2 IMPLANT
SUT PDS AB 0 CT1 36 (SUTURE) ×6 IMPLANT
SYR BULB IRRIGATION 50ML (SYRINGE) ×2 IMPLANT
SYR CONTROL 10ML LL (SYRINGE) ×1 IMPLANT
UNDERPAD 30X30 INCONTINENT (UNDERPADS AND DIAPERS) ×3 IMPLANT
WATER STERILE IRR 1500ML POUR (IV SOLUTION) ×3 IMPLANT

## 2012-10-07 NOTE — H&P (View-Only) (Signed)
Consult Note: Gyn-Onc  Consult was requested by Dr. Harraway-Smith for the evaluation of Deborah [name] 46 y.o. female  CC:  Chief Complaint  Patient presents with  . Endometrial  adenocarcinoma    New consult    HPI:46 y/o G2 P1 LNMP  06/2012.  Patient reports intermittent spotting between menses for over 1 year.  Occasional menorrhagia.  Last pap more than two years prior.  20 pound weight loss over the past year.  Deborah [name] was referred to Dr. Dove who collected an endometrial biopsy and a endocervical biopsy. Both are positive for endometrial adenocarcinoma.   Current Meds:  Outpatient Encounter Prescriptions as of 10/02/2012  Medication Sig Dispense Refill  . aspirin 81 MG tablet Take 81 mg by mouth daily.        . Elastic Bandages & Supports (WRIST SPLINT/COCK-UP/LEFT M) MISC 1 Device by Does not apply route once.  1 each  0  . furosemide (LASIX) 20 MG tablet Take 1 tablet (20 mg total) by mouth daily.  30 tablet  5  . naproxen (NAPROSYN) 500 MG tablet Take 1 tablet (500 mg total) by mouth 2 (two) times daily with a meal.  60 tablet  2  . sertraline (ZOLOFT) 100 MG tablet Take 1 tablet (100 mg total) by mouth daily.  30 tablet  5    Allergy: No Known Allergies  Social Hx:   History   Social History  . Marital Status: Single    Spouse Name: N/A    Number of Children: N/A  . Years of Education: N/A   Occupational History  . Not on file.   Social History Main Topics  . Smoking status: Never Smoker   . Smokeless tobacco: Never Used  . Alcohol Use: No  . Drug Use: No  . Sexually Active: Not Currently    Birth Control/ Protection: Condom   Other Topics Concern  . Not on file   Social History Narrative  . No narrative on file    Past Surgical Hx:  Past Surgical History  Procedure Date  . Hernia repair     Past Medical Hx:  Past Medical History  Diagnosis Date  . Congenital birth defect     Right hand  . Anemia     Past Gynecological History:  G2 P1  Menarche 15, Patient's last menstrual period was 09/20/2012. No h/o abn pap test  Family Hx:  Family History  Problem Relation Age of Onset  . Cancer Maternal Grandfather   . Hypertension Maternal Grandmother   . Diabetes Mother    Mat aunt lung cancer Dx 55 (tob use) Mat GF pancreatic cancer Mat cousin lung cancer dx 60's (tob use)  Review of Systems:  Constitutional  Feels well, depressed about the diagnosis of endometrial cancer.  Reports occasional hot flashes Cardiovascular  No chest pain, shortness of breath, or edema  Pulmonary  No cough or wheeze.  Gastro Intestinal  No nausea, vomitting, or diarrhoea. No bright red blood per rectum, no abdominal pain, change in bowel movement, or constipation.  Genito Urinary  No frequency, urgency, dysuria, reports vaginal spotting Musculo Skeletal  No myalgia, arthralgia, joint swelling or pain  Neurologic  No weakness, numbness, change in gait,  Psychology  No depression, anxiety, insomnia.   Vitals:  Blood pressure 110/78, pulse 68, temperature 99 F (37.2 C), temperature source Oral, resp. rate 22, height 5' 6" (1.676 m), weight 296 lb 12.8 oz (134.628 kg), last menstrual period 09/20/2012.Body mass index is   47.90 kg/(m^2).   Physical Exam: WD in NAD Neck  Supple NROM, without any enlargements.  Lymph Node Survey No cervical supraclavicular or inguinal adenopathy Cardiovascular  Pulse normal rate, regularity and rhythm. S1 and S2 normal.  Lungs  Clear to auscultation bilateraly, without wheezes/crackles/rhonchi. Good air movement.  Skin  No rash/lesions/breakdown  Psychiatry  Alert and oriented to person, place, and time  Abdomen  Normoactive bowel sounds, abdomen soft, non-tender and morbidly obese. Midline supraumbilical surgical  site intact without evidence of hernia.  Back No CVA tenderness Genito Urinary  Vulva/vagina: Normal external female genitalia.  No lesions.    Bladder/urethra:  No lesions or  masses  Vagina: Estrogenized, vaginal lesions  Cervix: cervix approximately 3 cm no lesions appreciated soft no parametrial nodularity  Uterus:  Unable to assess because of her body habitus Adnexa: Unable to assess because of body habitus Rectal  Good tone, no masses no cul de sac nodularity.  Extremities  No bilateral cyanosis, clubbing or edema.  Very large thighs and legs   Assessment/Plan:  Deborah [name]  is a 46 y.o.  year old with grade 2 endometrial adenocarcinoma noted in the endometrial biopsy and endocervical curettage specimens. On physical examination no gross lesions are appreciated in the cervix and the cervix is small. The patient's obesity is both central and in the thighs and hips. I doubt that para-aortic lymph node dissection would be feasible either open or with a minimally invasive approach. The only possibility for sampling of pelvic lymph nodes would be if a minimally invasive approach for her to be successful. Recommendation is for robotic assisted laparoscopic hysterectomy bilateral salpingo-oophorectomy bilateral pelvic lymph node dissection. The uterine size has not been previously characterized as such a pelvic ultrasound has been ordered to determine whether or not it is small enough to be delivered vaginally.  The patient is aware that even though the approach may start laparoscopically it is possible that laparotomy may need to be performed to safely complete of the surgical procedure. The risks of hysterectomy and pelvic lymph node dissection shared with the patient were that of infection bleeding damage to surrounding structures prolonged hospitalization and reoperation.  A pelvic ultrasound has been ordered and results are pending. If the uterus is significantly large an exploratory laparotomy will performed.    The procedure is scheduled for Tuesday, 10/07/2012. Lovenox will be administered preoperatively.   Demetress Tift, MD, PhD 10/02/2012, 2:12 PM   

## 2012-10-07 NOTE — Anesthesia Preprocedure Evaluation (Signed)
Anesthesia Evaluation  Patient identified by MRN, date of birth, ID band Patient awake    Reviewed: Allergy & Precautions, H&P , NPO status , Patient's Chart, lab work & pertinent test results  History of Anesthesia Complications (+) DIFFICULT AIRWAY  Airway Mallampati: II TM Distance: >3 FB Neck ROM: full    Dental No notable dental hx. (+) Teeth Intact and Dental Advisory Given   Pulmonary neg pulmonary ROS,  breath sounds clear to auscultation  Pulmonary exam normal       Cardiovascular Exercise Tolerance: Good negative cardio ROS  Rhythm:regular Rate:Normal     Neuro/Psych negative neurological ROS  negative psych ROS   GI/Hepatic negative GI ROS, Neg liver ROS, GERD-  Controlled,  Endo/Other  negative endocrine ROSMorbid obesity  Renal/GU negative Renal ROS  negative genitourinary   Musculoskeletal   Abdominal   Peds  Hematology negative hematology ROS (+)   Anesthesia Other Findings   Reproductive/Obstetrics negative OB ROS                           Anesthesia Physical Anesthesia Plan  ASA: III  Anesthesia Plan: General   Post-op Pain Management:    Induction: Intravenous  Airway Management Planned: LMA  Additional Equipment:   Intra-op Plan:   Post-operative Plan:   Informed Consent: I have reviewed the patients History and Physical, chart, labs and discussed the procedure including the risks, benefits and alternatives for the proposed anesthesia with the patient or authorized representative who has indicated his/her understanding and acceptance.   Dental Advisory Given  Plan Discussed with: CRNA and Surgeon  Anesthesia Plan Comments:         Anesthesia Quick Evaluation

## 2012-10-07 NOTE — Interval H&P Note (Signed)
History and Physical Interval Note:  10/07/2012 7:01 PM  Deborah [name]  has presented today for surgery, with the diagnosis of ENDOMETRIAL CANCER  The various methods of treatment have been discussed with the patient and family. After consideration of risks, benefits and other options for treatment, the patient has consented to  Procedure(s) (LRB) with comments: ROBOTIC ASSISTED TOTAL HYSTERECTOMY WITH BILATERAL SALPINGO OOPHORECTOMY (Bilateral) as a surgical intervention .  The patient's history has been reviewed, patient examined, no change in status, stable for surgery.  I have reviewed the patient's chart and labs.  Questions were answered to the patient's satisfaction.    Procedure:  Examination under anesthesia, repair of vaginal cuff dehiscence, possible laparoscopy.  [place], [facility]

## 2012-10-07 NOTE — Interval H&P Note (Signed)
History and Physical Interval Note:  10/07/2012 12:56 PM  Deborah [name]  has presented today for surgery, with the diagnosis of ENDOMETRIAL CANCER  The various methods of treatment have been discussed with the patient and family. After consideration of risks, benefits and other options for treatment, the patient has consented to  Procedure(s) (LRB) with comments: ROBOTIC ASSISTED TOTAL HYSTERECTOMY WITH BILATERAL SALPINGO OOPHORECTOMY (Bilateral) - POSSIBLE LYMPH NODE DISECTION OR BIOPSY as a surgical intervention .  The patient's history has been reviewed, patient examined, no change in status, stable for surgery.  I have reviewed the patient's chart and labs.  Questions were answered to the patient's satisfaction.     [place], [facility]

## 2012-10-07 NOTE — Progress Notes (Signed)
Patient had a gush of large amount of vaginal bloody drainage - Dr. Nelly Rout made aware- in to see patient- vaginally checked her- patient cleaned

## 2012-10-07 NOTE — Progress Notes (Signed)
Dr. Nelly Rout back in- patient having a small amount of bloody vaginal bleeding- Dr. Nelly Rout  Did vaginal swabbing- patient stiil bleeding- to go to O.R.

## 2012-10-07 NOTE — Transfer of Care (Signed)
Immediate Anesthesia Transfer of Care Note  Patient: Deborah [name]  Procedure(s) Performed: Procedure(s) (LRB) with comments: ROBOTIC ASSISTED TOTAL HYSTERECTOMY WITH BILATERAL SALPINGO OOPHORECTOMY (Bilateral)  Patient Location: PACU  Anesthesia Type:General  Level of Consciousness: awake, alert , oriented and patient cooperative  Airway & Oxygen Therapy: Patient Spontanous Breathing and Patient connected to face mask oxygen  Post-op Assessment: Report given to PACU RN, Post -op Vital signs reviewed and stable and Patient moving all extremities X 4  Post vital signs: Reviewed and stable  Complications: No apparent anesthesia complications

## 2012-10-07 NOTE — Op Note (Signed)
Preoperative Diagnosis: Figo grade 2 endometrial cancer, morbid obesity  Postoperative Diagnosis: Stage IVB endometrial cancer, Morbid obesity  Procedure(s) Performed: Robotic total laparoscopic hysterectomy, Bilateral salpingo oophorectomy,  Peritoneal biopsies.  Anesthesia: GET  Surgeon: [name].  Nelly Rout, M.D. PhD  Assistant Surgeon: Antionette Char MD  Assistant: Telford Nab RN, MSN  Specimens: Uterus cervix,ovaries, peritoneal biopsies  Estimated Blood Loss: minimal mL.   Complications:none  Indication for Procedure: This is a 89 age-old who underwent uterine curettage. Uterine curettings demonstrated grade 2 endometrial cancer.  Operative Findings:  8 cm uterus. normal adnexa. Metastatic disease in the cul de sac, the rectosigmoid  and the right colic gutter.   Procedure: Patient was taken to the operating room and placed under general endotracheal anesthesia without any difficulty. She is placed in the dorsal lithotomy position and secured to the operative table over the chest with tape.   The patient was prepped and draped and the uterine manipulator placed within the endometrial cavity. The appropriately sized Koh ring was circumferentially around the cervix. The balloon was placed within the vagina. An OG tube was present and functional. At an area on the left in line with the nipple approximately 3 cm below the ribs the area was incised and a 5 mm Optiview inserted under direct visualization. The abdomen was insufflated to 15 mm of mercury and the pressure never deviated above that throughout the remainder of the procedure. Maximum Trendelenburg positioning was obtained. At approximately 23 cm proximal to the symphysis pubis an incision was made just superior to the umbilicus. Incisions were made 10 cm lateral to this incision and 2 cm superior to the left anterior superior iliac spine.  10 mm trocar was inserted in the superior umbilicus incision. Millimeter robotic ports  were placed in the other 3 incisions. The left upper quadrant port site was replaced with a 10 mm port. This was all completed under direct visualization. The small and large bowel were reflected as much as possible into the upper abdomen. The robot was docked and instruments placed.  The operative findings were as noted above.  The metastatic disease near the cecum was dissected and removed from the abdominal cavity.  The right round ligament was transected and the ureter was identified. The right infundibulopelvic ligament was cauterized and transected The retroperitoneal space was entered on the right and the peritoneum incised to the level of the vesicouterine ligament anteriorly. The bladder flap was created using Bovie cautery. The peritoneal dissection was continued inferiorly and across the inferior most aspect of the cervix. In this manner the urethra was deflected inferiorly. The bladder flap was further developed. The uterine vessels on the right were skeletonized ligated and transected.  The left ureter was identified. The left gonadal vessels were cauterized and transected. The broad ligament was skeletonized posteriorly to the level of the cervix and the peritoneum dissected free from the cervix and in this fashion the ureter was deflected inferiorly. The anterior peritoneum was further dissected and the bladder flap appropriately developed. The uterine vessels were skeletonized cauterized and transected. The balloon and the vagina was then maximally insufflated. A olpotomy incision was made circumferentially and the uterus cervix ovaries and tubes were ivered from the vagina. The Koh ring was removed and the balloon was replaced.  The pelvis was copiously irrigated and drained and hemostasis was assured. The vaginal cuff was closed with a two running 0 Vicryl suture ligatures. The needle was removed under direct visualization. The operative site is once again  visualized and hemostasis was  assured. The instruments were removed from the abdomen and pelvis and the port sites irrigated. The umbilical fascia was closed with an interrupted 0 Vicryl  suture. The subcutaneous tissue of the umbilical left upper quadrant and right lower quadrant subcutaneous tissues were approximated with a single suture. Skin incisions were closed with a 4.0 Monocryl subcuticular suture.  The vaginal vault was cleared with a moist sponge stick.  Sponge, lap and needle counts were correct x 3.    The patient had sequential compression devices and preoperative Lovenox for VTE prophylaxis and will receive Lovenox postoperatively.          Disposition: PACU - hemodynamically stable.         Condition:stable Foley draining clear urine.

## 2012-10-07 NOTE — Transfer of Care (Signed)
Immediate Anesthesia Transfer of Care Note  Patient: Deborah [name]  Procedure(s) Performed: Procedure(s) (LRB) with comments: REPAIR VAGINAL CUFF ()  Patient Location: PACU  Anesthesia Type:General  Level of Consciousness: awake, alert , oriented and patient cooperative  Airway & Oxygen Therapy: Patient Spontanous Breathing and Patient connected to face mask oxygen  Post-op Assessment: Report given to PACU RN, Post -op Vital signs reviewed and stable and Patient moving all extremities X 4  Post vital signs: Reviewed and stable  Complications: No apparent anesthesia complications

## 2012-10-07 NOTE — Progress Notes (Signed)
Son in to see patient and talked with Dr. Nelly Rout.

## 2012-10-07 NOTE — Anesthesia Postprocedure Evaluation (Signed)
  Anesthesia Post-op Note  Patient: Deborah [name]  Procedure(s) Performed: Procedure(s) (LRB): REPAIR VAGINAL CUFF ()  Patient Location: PACU  Anesthesia Type: General  Level of Consciousness: awake and alert   Airway and Oxygen Therapy: Patient Spontanous Breathing  Post-op Pain: mild  Post-op Assessment: Post-op Vital signs reviewed, Patient's Cardiovascular Status Stable, Respiratory Function Stable, Patent Airway and No signs of Nausea or vomiting  Post-op Vital Signs: stable  Complications: No apparent anesthesia complications

## 2012-10-07 NOTE — Anesthesia Preprocedure Evaluation (Addendum)
Anesthesia Evaluation  Patient identified by MRN, date of birth, ID band Patient awake    Reviewed: Allergy & Precautions, H&P , NPO status , Patient's Chart, lab work & pertinent test results  History of Anesthesia Complications (+) AWARENESS UNDER ANESTHESIA  Airway Mallampati: II TM Distance: >3 FB Neck ROM: full    Dental No notable dental hx. (+) Teeth Intact and Dental Advisory Given   Pulmonary neg pulmonary ROS,  breath sounds clear to auscultation  Pulmonary exam normal       Cardiovascular Exercise Tolerance: Good negative cardio ROS  Rhythm:regular Rate:Normal  Patient has atypical chest pain periodically, including this morning around 630 am.  ECG is unchanged today and the pain is more assoc. With anxiety or musculosceletal origin.   Neuro/Psych Anxiety Depression negative neurological ROS  negative psych ROS   GI/Hepatic negative GI ROS, Neg liver ROS,   Endo/Other  negative endocrine ROSMorbid obesity  Renal/GU negative Renal ROS  negative genitourinary   Musculoskeletal   Abdominal   Peds  Hematology negative hematology ROS (+)   Anesthesia Other Findings Congenital birth defect  Reproductive/Obstetrics negative OB ROS                         Anesthesia Physical Anesthesia Plan  ASA: III  Anesthesia Plan: General   Post-op Pain Management:    Induction: Intravenous  Airway Management Planned: Oral ETT  Additional Equipment:   Intra-op Plan:   Post-operative Plan: Extubation in OR  Informed Consent: I have reviewed the patients History and Physical, chart, labs and discussed the procedure including the risks, benefits and alternatives for the proposed anesthesia with the patient or authorized representative who has indicated his/her understanding and acceptance.   Dental Advisory Given  Plan Discussed with: CRNA and Surgeon  Anesthesia Plan Comments:          Anesthesia Quick Evaluation

## 2012-10-07 NOTE — H&P (Signed)
Called to the bedside for vaginal bleeding.  Copious thin vaginal bleeding appreciated.  Digital examination with palpable space between sutures.  Suspect vaginal cuff dehiscence.  The patient and her son were counseled that return to the OR is advised for assessment and repair.  There is the possibility that laparoscopy will be required.

## 2012-10-07 NOTE — Preoperative (Signed)
Beta Blockers   Reason not to administer Beta Blockers:Not Applicable 

## 2012-10-07 NOTE — Anesthesia Postprocedure Evaluation (Signed)
  Anesthesia Post-op Note  Patient: Deborah [name]  Procedure(s) Performed: Procedure(s) (LRB): ROBOTIC ASSISTED TOTAL HYSTERECTOMY WITH BILATERAL SALPINGO OOPHORECTOMY (Bilateral)  Patient Location: PACU  Anesthesia Type: General  Level of Consciousness: awake and alert   Airway and Oxygen Therapy: Patient Spontanous Breathing  Post-op Pain: mild  Post-op Assessment: Post-op Vital signs reviewed, Patient's Cardiovascular Status Stable, Respiratory Function Stable, Patent Airway and No signs of Nausea or vomiting  Post-op Vital Signs: stable  Complications: No apparent anesthesia complications

## 2012-10-07 NOTE — Op Note (Signed)
Preoperative Diagnosis: Vaginal cuff dehiscence  Postoperative Diagnosis: Vaginal cuff dehiscence  Procedure(s) Performed: Vaginal colporrhaphy  Surgeon: [name].  Nelly Rout, M.D. PhD  Assistant Surgeon: Antionette Char M.D.   Anesthesia: GET  Specimens: None  Estimated Blood Loss: Minimal mL. Blood Replacement: None   Indication for Procedure: This patient is morbidly obese and status post robotic laparoscopic hysterectomy bilateral salpingo-oophorectomy. I was called to attend to the patient in the PACU for a report of a gush of copious vaginal fluid. On assessment the urine output was within normal limits and the vaginal cuff was noted to be open at the right aspect.   Operative Findings: The patient was taken to the operating room and blood tinged fluid was noted within the vagina. The vaginal cuff was inspected and appeared intact. However with probing bilateral angles were noted to be open.  Procedure: The patient was taken to the operating room and time out was performed. She was placed in the general endotracheal anesthesia and then placed in [place] stirrups.  She was prepped and draped in usual sterile fashion. The vagina on inspection was noted to be intact. However with gentle probing bilateral angles were open the right much more significantly so than the left. The vaginal edges were visualized at the lateral aspects. Interrupted sutures were placed at the angles. The remainder of the vaginal cuff was closed with imbricating sutures in 2 layers. At the completion of the procedure of Valsalva was performed without any discharge from the vagina.  Sponge, lap and needle counts were correct x 3.    Complications: None  The patient had sequential compression devices for VTE prophylaxis .           Disposition: PACU - hemodynamically stable.         Condition: stable

## 2012-10-08 ENCOUNTER — Encounter (HOSPITAL_COMMUNITY): Payer: Self-pay | Admitting: Gynecologic Oncology

## 2012-10-08 ENCOUNTER — Ambulatory Visit: Payer: Medicaid Other | Admitting: Obstetrics & Gynecology

## 2012-10-08 ENCOUNTER — Telehealth: Payer: Self-pay | Admitting: Family Medicine

## 2012-10-08 DIAGNOSIS — Z8542 Personal history of malignant neoplasm of other parts of uterus: Secondary | ICD-10-CM | POA: Diagnosis present

## 2012-10-08 LAB — BASIC METABOLIC PANEL
BUN: 8 mg/dL (ref 6–23)
CO2: 27 mEq/L (ref 19–32)
Calcium: 8.4 mg/dL (ref 8.4–10.5)
Chloride: 98 mEq/L (ref 96–112)
Creatinine, Ser: 0.7 mg/dL (ref 0.50–1.10)

## 2012-10-08 LAB — CBC
HCT: 37.7 % (ref 36.0–46.0)
MCH: 28.1 pg (ref 26.0–34.0)
MCHC: 32.9 g/dL (ref 30.0–36.0)
MCV: 85.3 fL (ref 78.0–100.0)
Platelets: 296 10*3/uL (ref 150–400)
RDW: 15.5 % (ref 11.5–15.5)

## 2012-10-08 MED ORDER — ENOXAPARIN SODIUM 150 MG/ML ~~LOC~~ SOLN: 1.0000 mg/kg | SUBCUTANEOUS | Status: DC

## 2012-10-08 MED ORDER — ENOXAPARIN SODIUM 40 MG/0.4ML ~~LOC~~ SOLN
40.0000 mg | SUBCUTANEOUS | Status: DC
  Administered 2012-10-08: 40 mg via SUBCUTANEOUS
  Filled 2012-10-08: qty 0.4

## 2012-10-08 MED ORDER — POTASSIUM CHLORIDE CRYS ER 10 MEQ PO TBCR: 10.0000 meq | EXTENDED_RELEASE_TABLET | Freq: Two times a day (BID) | ORAL | Status: DC

## 2012-10-08 MED ORDER — OXYCODONE-ACETAMINOPHEN 5-325 MG PO TABS: 1.0000 | ORAL_TABLET | ORAL | Status: DC | PRN

## 2012-10-08 MED ORDER — POTASSIUM CHLORIDE CRYS ER 10 MEQ PO TBCR
10.0000 meq | EXTENDED_RELEASE_TABLET | Freq: Two times a day (BID) | ORAL | Status: DC
  Administered 2012-10-08: 10 meq via ORAL
  Filled 2012-10-08 (×2): qty 1

## 2012-10-08 NOTE — Progress Notes (Signed)
1 Day Post-Op Procedure(s) (LRB): REPAIR VAGINAL CUFF ()  Subjective: Patient reports mild incisional soreness.  Tolerating solid food.  Passing flatus.  Reporting adequate pain relief.  Objective: Vital signs in last 24 hours: Temp:  [97.5 F (36.4 C)-98.7 F (37.1 C)] 97.5 F (36.4 C) (11/13 1000) Pulse Rate:  [75-98] 80  (11/13 1000) Resp:  [12-25] 20  (11/13 1000) BP: (94-142)/(61-88) 94/61 mmHg (11/13 1000) SpO2:  [95 %-100 %] 97 % (11/13 1000) Weight:  [294 lb (133.358 kg)] 294 lb (133.358 kg) (11/12 2238)   Intake/Output from previous day: 11/12 0701 - 11/13 0700 In: 3046.3 [P.O.:240; I.V.:2806.3] Out: 675 [Urine:625; Blood:50]  Physical Examination: General: alert, cooperative and no distress Resp: clear to auscultation bilaterally Cardio: regular rate and rhythm, S1, S2 normal, no murmur, click, rub or gallop GI: soft, non-tender; bowel sounds normal; no masses,  no organomegaly, incision: lap sites x 5 with steri strips clean, dry, and intact and abdomen obese Extremities: mild bilateral lower extrem. edema noted  Labs: WBC/Hgb/Hct/Plts:  11.1/12.4/37.7/296 (11/13 0418) BUN/Cr/glu/ALT/AST/amyl/lip:  8/0.70/--/--/--/--/-- (11/13 1610)  Assessment: 46 y.o. s/p Procedure(s): REPAIR VAGINAL CUFF: stable Pain:  Pain is well-controlled on oral medications.  GI:  Tolerating po: Yes.  Passing flatus.  FEN: Mild hypokalemia post-operative, K+ 3.3 this am.   Prophylaxis:  PAS hose and Lovenox injections during hospital stay.  Plan: Saline lock IV Begin Potassium PO BID for 3 days Continue post-operative care Plan for discharge this afternoon   LOS: 1 day    Antoinett Dorman DEAL 10/08/2012, 10:20 AM

## 2012-10-08 NOTE — Telephone Encounter (Signed)
Let Dr. Lula Olszewski know that she has a cardiac click and her EKG was normal however would like for her to be seen. She made and appt for her to be seen on 11.20.2013 @ 330 however she wanted to know if she should come in sooner?

## 2012-10-08 NOTE — Discharge Summary (Signed)
Physician Discharge Summary  Patient ID: Deborah [name] MRN: [id number] DOB/AGE: 44-15-1967 44 y.o.  Admit date: 10/07/2012 Discharge date: 10/08/2012  Admission Diagnoses: Endometrial cancer  Discharge Diagnoses:  Principal Problem:  *Endometrial cancer  Discharged Condition:  The patient is in good condition and stable for discharge.  Hospital Course: On 10/07/2012, the patient underwent the following: Procedure(s):  Robotic total laparoscopic hysterectomy, Bilateral salpingo oophorectomy, Peritoneal biopsies.  The postoperative course was uneventful.  She was discharged to home on postoperative day 1 tolerating a regular diet.  Patient is to follow up with her primary care physician, Dr. Ardyth Gal, to follow up on cardiac click noted post-operatively.  Consults: None  Significant Diagnostic Studies: None  Treatments: surgery: see above  Discharge Exam: Blood pressure 100/68, pulse 87, temperature 98 F (36.7 C), temperature source Oral, resp. rate 20, height 5\' 5"  (1.651 m), weight 294 lb (133.358 kg), last menstrual period 09/20/2012, SpO2 97.00%. General appearance: alert, cooperative and no distress Resp: clear to auscultation bilaterally Cardio: regular rate and rhythm and mid systolic click present GI: soft, non-tender; bowel sounds normal; no masses,  no organomegaly and abdomen obese Extremities: mild BLE edema noted Incision/Wound: Lap sites to abdomen with steri strips clean, dry, and intact.  Steri strips replaced to lower LLQ incision. EKG: Normal sinus rhythm with rate 87 bpm.  Disposition:   Discharge Orders    Future Appointments: Provider: Department: Dept Phone: Center:   10/15/2012 3:30 PM Ardyth Gal, MD [facility] [phone] [facility]   10/16/2012 12:45 PM Laurette Schimke, MD PHD [facility] [phone] None   11/13/2012 2:00 PM Beverley Fiedler, MD [facility] [phone] [id number]     Future Orders Please Complete By Expires   Diet - low sodium heart healthy      Increase activity slowly      Driving Restrictions      Comments:   No driving for 2 weeks.  Do not take narcotics and drive.   Lifting restrictions      Comments:   No lifting greater than 10 lbs.   Sexual Activity Restrictions      Comments:   No sexual activity, nothing in the vagina, for 8 weeks.   Call MD for:  temperature >100.4      Call MD for:  persistant nausea and vomiting      Call MD for:  severe uncontrolled pain      Call MD for:  redness, tenderness, or signs of infection (pain, swelling, redness, odor or green/yellow discharge around incision site)      Call MD for:  difficulty breathing, headache or visual disturbances      Call MD for:  hives      Call MD for:  persistant dizziness or light-headedness      Call MD for:  extreme fatigue      Discharge instructions      Comments:   Please follow up with your primary care physician, Dr. Ardyth Gal, within one week after discharge.       Medication List     As of 10/08/2012  3:04 PM    STOP taking these medications         peg 3350 powder 100 G Solr   Commonly known as: MOVIPREP      TAKE these medications         aspirin 81 MG tablet   Take 81 mg by mouth every morning.  bifidobacterium infantis capsule   Take 1 capsule by mouth daily.      furosemide 20 MG tablet   Commonly known as: LASIX   Take 20 mg by mouth every morning.      lactase 3000 UNITS tablet   Commonly known as: LACTAID   Take 1 tablet (3,000 Units total) by mouth 3 (three) times daily with meals.      naproxen 500 MG tablet   Commonly known as: NAPROSYN   Take 1 tablet (500 mg total) by mouth 2 (two) times daily with a meal.      oxyCODONE-acetaminophen 5-325 MG per tablet   Commonly known as: PERCOCET/ROXICET   Take 1-2 tablets by mouth every 4 (four) hours as needed (moderate to severe pain (when  tolerating fluids)).      potassium chloride 10 MEQ tablet   Commonly known as: K-DUR,KLOR-CON   Take 1 tablet (10 mEq total) by mouth 2 (two) times daily.      sertraline 100 MG tablet   Commonly known as: ZOLOFT   Take 100 mg by mouth every evening.           Follow-up Information    Follow up with Laurette Schimke, MD PHD. On 10/16/2012. (at 12:45.  Arrive at 12:15 to register at the [facility])    Contact information:   [address] [place] [address] [phone]       Follow up with [facility], MD. On 10/15/2012. (at 3:30pm.)    Contact information:   [address] [place] [address] [phone]         Signed: Warner Mccreedy DEAL 10/08/2012, 3:04 PM

## 2012-10-09 NOTE — Telephone Encounter (Signed)
Attempted to call Melissa back and inform of the below.  No answer and did not want to LM as I was not sure if Efraim Kaufmann was the only one to check messages. Thessaly Mccullers, Maryjo [place]

## 2012-10-09 NOTE — Telephone Encounter (Signed)
Appointment day/time is fine.  Please notify patient.

## 2012-10-10 ENCOUNTER — Telehealth: Payer: Self-pay | Admitting: *Deleted

## 2012-10-13 NOTE — Telephone Encounter (Signed)
Patient notified of Path results.   

## 2012-10-14 ENCOUNTER — Telehealth: Payer: Self-pay | Admitting: Gynecologic Oncology

## 2012-10-14 NOTE — Telephone Encounter (Signed)
Post op telephone call to check patient status.  Patient describes expected post operative status.  Adequate PO intake reported.  Bladder functioning without difficulty.  Reporting constipation with last bowel movement yesterday.  Using laxatives as needed.  Instructed to monitor and continue taking laxatives as needed.  Pain minimal.  Reportable signs and symptoms reviewed.  Follow up appt with Dr. Nelly Rout this Thursday.

## 2012-10-15 ENCOUNTER — Ambulatory Visit: Payer: Medicaid Other | Admitting: Family Medicine

## 2012-10-16 ENCOUNTER — Encounter: Payer: Self-pay | Admitting: Gynecologic Oncology

## 2012-10-16 ENCOUNTER — Encounter: Payer: Self-pay | Admitting: Family Medicine

## 2012-10-16 ENCOUNTER — Ambulatory Visit (INDEPENDENT_AMBULATORY_CARE_PROVIDER_SITE_OTHER): Payer: Medicaid Other | Admitting: Family Medicine

## 2012-10-16 ENCOUNTER — Ambulatory Visit: Payer: Medicaid Other | Attending: Gynecologic Oncology | Admitting: Gynecologic Oncology

## 2012-10-16 VITALS — BP 118/70 | HR 82 | Temp 99.5°F | Resp 18 | Ht 66.3 in | Wt 287.7 lb

## 2012-10-16 VITALS — BP 112/65 | HR 93 | Temp 99.1°F | Ht 65.0 in | Wt 288.0 lb

## 2012-10-16 DIAGNOSIS — K5909 Other constipation: Secondary | ICD-10-CM

## 2012-10-16 DIAGNOSIS — C549 Malignant neoplasm of corpus uteri, unspecified: Secondary | ICD-10-CM

## 2012-10-16 DIAGNOSIS — K5903 Drug induced constipation: Secondary | ICD-10-CM

## 2012-10-16 DIAGNOSIS — R0789 Other chest pain: Secondary | ICD-10-CM

## 2012-10-16 DIAGNOSIS — J069 Acute upper respiratory infection, unspecified: Secondary | ICD-10-CM

## 2012-10-16 NOTE — Progress Notes (Signed)
  Subjective:    Patient ID: Deborah [name], female    DOB: 1965/12/04, 46 y.o.   MRN: [id number]  HPI  Deborah [name] comes in for follow up.  She recently had a hysterectomy for endometrial cancer.  While she was in the hospital, she complained of chest pain.  Her Gynecologist told her she had a "click" on cardiac exam, but her ECG was normal. She says her chest pain has been going on a long time.  She says it usually happens when she is sitting down or laying flat, not when she is exerting herself.  Nothing in particular causes it or makes it better.  She is not currently having chest pain.   She also complains of some nasal congestion that has been going on a few days.  She has had a little bit of a headache, but no fevers, chills, no cough or difficulty breathing.   She says she is taking a laxative, but she is still having constipation which is causing abdominal pain.  She is taking percocet for her surgery pain.  She is not taking any other stool softeners.   Past Medical History  Diagnosis Date  . Congenital birth defect     Right hand  . Anemia   . Arthritis     knees  . Endometrial cancer   . Anxiety   . Swelling     ANKLES - TAKES LASIX  . Depression   . HLD (hyperlipidemia)     borderline  . Obesity   Chart reviewed, in 2010 patient had a Nuclear stress test that showed mild wall motion abnormalities, but no signs of ischemia.   Family History  Problem Relation Age of Onset  . Pancreatic cancer Maternal Grandfather     or liver cancer  . Hypertension Maternal Grandmother   . Diabetes Mother   . Colon polyps Maternal Aunt   . Colon cancer Cousin   . Lung cancer Maternal Aunt   . Hypertension Maternal Grandfather    History  Substance Use Topics  . Smoking status: Never Smoker   . Smokeless tobacco: Never Used  . Alcohol Use: Yes     Comment: rare   Review of Systems See HPI    Objective:   Physical Exam BP 112/65  Pulse 93  Temp 99.1 F (37.3 C) (Oral)  Ht 5'  5" (1.651 m)  Wt 288 lb (130.636 kg)  BMI 47.93 kg/m2  LMP 09/20/2012 General appearance: alert, cooperative and no distress Ears: normal TM's and external ear canals both ears Nose: clear discharge, turbinates red Throat: lips, mucosa, and tongue normal; teeth and gums normal Lungs: clear to auscultation bilaterally Heart: regular rate and rhythm, S1, S2 normal, no murmur, click, rub or gallop       Assessment & Plan:

## 2012-10-16 NOTE — Assessment & Plan Note (Signed)
I do not detect abnormalities on cardiac auscultation.  However, I will refer to cardiology as GYN felt there was an abnormality, and she continues to have intermittent chest heaviness and discomfort.  As the symptoms are not exertional, I suspect her pain is related more to her body habitus and chest wall pain, but given her morbid obesity, she may benefit from further cardiac work up.

## 2012-10-16 NOTE — Assessment & Plan Note (Signed)
Advised her to take a stool softener daily while on percocet, and that laxative may be making abdominal pain worse.

## 2012-10-16 NOTE — Patient Instructions (Addendum)
It was good to see you.  I have made a referral for you to see the Cardiologist again to see if they think you need another stress test or other tests on your heart. Our office will call you with that appointment.   For your constipation, please start taking Miralax (Polyethylene glycol), a capful every day mixed in 8 oz of water.    Please start taking your zoloft every day, this will help with your mood, worrying, and sleep.   Please come back and see me in about one month so I can see how you are doing.

## 2012-10-16 NOTE — Assessment & Plan Note (Signed)
No red flags in history or exam, suggested supportive care.

## 2012-10-16 NOTE — Patient Instructions (Signed)
  Followup in 6 weeks CT scan of the abdomen and pelvis in 4-6 weeks  Thank you very much Ms. [name] for allowing me to provide care for you today.  I appreciate your confidence in choosing our Gynecologic Oncology team.  If you have any questions about your visit today please call our office and we will get back to you as soon as possible.  [name]. Talana Slatten MD., PhD Gynecologic Oncology

## 2012-10-16 NOTE — Progress Notes (Signed)
Treatment planning: Gyn-Onc  Consult was requested by Dr. Erin Fulling for the evaluation of Deborah [name] 46 y.o. female  CC: Endometrial cancer  HPI:46 y/o G2 P1 LNMP  06/2012.  Patient reports intermittent spotting between menses for over 1 year.  Occasional menorrhagia.  Last pap more than two years prior.  20 pound weight loss over the past year.  Deborah [name] was referred to Dr. Marice Potter who collected an endometrial biopsy and a endocervical biopsy. Both are positive for endometrial adenocarcinoma.  On 10/07/2012 she underwent robotic hysterectomy bilateral salpingo-oophorectomy. At the time of surgery multiple cystic lesions were noted throughout the entire pelvis that was suspicious for metastatic disease, as such lymph node dissection was not collected but these lesions were biopsied. Final pathology was notable for  1. Soft tissue, biopsy, right para-colic gutter - BENIGN PARA-COLIC GUTTER SOFT TISSUE WITH BENIGN CYST (4.0 CM). SEE COMMENT. - NEGATIVE FOR MALIGNANCY. 2. Uterus +/- tubes/ovaries, neoplastic - ENDOMETRIAL ADENOCARCINOMA, ENDOMETRIOID-TYPE, SEE COMMENT. - TUMOR INVADES LESS THAN 1/2 OF MYOMETRIAL THICKNESS. - NO LYMPHOVASCULAR INVASION IDENTIFIED. - TUMOR INVADES INTO LOWER UTERINE SEGMENT. - UTERINE ADENOMYOSIS. - BENIGN RIGHT AND LEFT OVARIES; NO ATYPIA OR MALIGNANCY PRESENT. - BENIGN RIGHT AND LEFT FALLOPIAN TUBES WITH BENIGN SEROUS-TYPE PARATUBAL CYSTS. - BENIGN CERVIX; NEGATIVE FOR INTRAEPITHELIAL LESION OR MALIGNANCY. - BENIGN MULTILOCULAR UTERINE SEROSAL CYST. - SEE TUMOR SYNOPTIC TEMPLATE BELOW  1. In addition to the benign fibrovascular and adipose soft tissue, there is a multilocular cyst lined by tubal-type epithelium with cilia. There are no atypical or malignant epithelial features identified. Within the cyst wall,there are smaller cysts and glands lined by similar benign appearing epithelium. 2. UTERUS Specimen: Uterus and bilateral fallopian tubes and  ovaries Procedure: Hysterectomy-bilateral salpingo-oophorectomy Lymph node sampling performed: Not performed Specimen integrity: Intact Maximum tumor size (cm): See comment Histologic type: Adenocarcinoma Grade: II of III Myometrial invasion: 0.4 cm where myometrium is 3.2 cm in thickness (12.5%) Cervical stromal involvement: None Extent of involvement of other organs: None Lymph vascular invasion: Absent Peritoneal washings: N/A Lymph nodes: number examined 0; number positive N/A TNM code: pT1a, pN0, pMX Comments: Although there was not tumor grossly identified, sections from the entire endometrium demonstrate adenocarcinoma and complex hyperplasia with atypia spanning the entire 5.0 cm endometrial cavity. Tumor extensively involves adenomyosis. However, involvement of adenomyosis by tumor does not constitute myometrial invasion; regardless of depth of tumor involving adenomyosis. There are multiple foci of squamous metaplasia present within the tumor. The serosal cyst identified has identical histopathology to the cyst present in the right para-colic gutter and is benign.   Vaginal cuff dehiscence was appreciated in the PACU and she returned to the or the same evening for vaginal colporrhaphy.  Past Surgical Hx:  Past Surgical History  Procedure Date  . Hiatal hernia repair   . Repair vaginal cuff 10/07/2012    Procedure: REPAIR VAGINAL CUFF;  Surgeon: Laurette Schimke, MD PHD;  Location: [facility];  Service: Gynecology;;  . Robotic assisted total hysterectomy with bilateral salpingo oopherectomy 10/07/2012    Procedure: ROBOTIC ASSISTED TOTAL HYSTERECTOMY WITH BILATERAL SALPINGO OOPHORECTOMY;  Surgeon: Laurette Schimke, MD PHD;  Location: [facility];  Service: Gynecology;  Laterality: Bilateral;    Past Medical Hx:  Past Medical History  Diagnosis Date  . Congenital birth defect     Right hand  . Anemia   . Arthritis     knees  . Endometrial cancer   . Anxiety   . Swelling     ANKLES  -  TAKES LASIX  . Depression   . HLD (hyperlipidemia)     borderline  . Obesity     Past Gynecological History:  G2 P1 Menarche 86, Patient's last menstrual period was 09/20/2012. No h/o abn pap test  Family Hx:  Family History  Problem Relation Age of Onset  . Pancreatic cancer Maternal Grandfather     or liver cancer  . Hypertension Maternal Grandmother   . Diabetes Mother   . Colon polyps Maternal Aunt   . Colon cancer Cousin   . Lung cancer Maternal Aunt   . Hypertension Maternal Grandfather    Mat aunt lung cancer Dx 43 (tob use) Mat GF pancreatic cancer Mat cousin lung cancer dx 37's (tob use)  Review of Systems:  Constitutional  Feels well, Cardiovascular  No chest pain, shortness of breath, or edema  Pulmonary  No cough or wheeze.  Gastro Intestinal  No nausea, vomitting, or diarrhoea. No bright red blood per rectum, no abdominal pain, change in bowel movement, or constipation.  Genito Urinary  No frequency, urgency, dysuria, reports vaginal spotting Musculo Skeletal  No myalgia, arthralgia, joint swelling or pain   Physical Exam: BP 118/70  Pulse 82  Temp 99.5 F (37.5 C) (Oral)  Resp 18  Ht 5' 6.3" (1.684 m)  Wt 287 lb 11.2 oz (130.5 kg)  BMI 46.02 kg/m2  LMP 09/20/2012  WD in NAD Neck  Abdomen  Normoactive bowel sounds, abdomen soft, non-tender and morbidly obese. Surgical  sites intact without evidence of hernia.   Assessment/Plan:  Deborah [name]  is a 46 y.o.  year old with grade 2 endometrial adenocarcinoma noted on  the endometrial biopsy and endocervical curettage specimens. She underwent robotic total laparoscopic hysterectomy bilateral salpingo-oophorectomy. At the time of surgery diffuse miliary cystic-like lesions were noted in the cul-de-sac on on the rectosigmoid colon highly suspicious for metastatic disease. Because of these findings lymph node dissection was not performed. Final pathology is notable for grade 2 endometrioid  endometrial adenocarcinoma with 12.5% myometrial invasion no lymphovascular space invasion no cervical involvement.  At this visit the patient was advised to have a CT scan of the abdomen and pelvis to assess the pelvic and periaortic lymph nodes since these were not surgically assessed. This 15 minute visit was spent in discussion of the surgical findings and treatment plan  Followup in 6 weeks CT scan of the abdomen and pelvis in 4-6 weeks  Laurette Schimke, MD, PhD 10/16/2012, 12:55 PM

## 2012-11-07 ENCOUNTER — Encounter: Payer: Medicaid Other | Admitting: Cardiology

## 2012-11-11 ENCOUNTER — Telehealth: Payer: Self-pay | Admitting: Internal Medicine

## 2012-11-11 NOTE — Telephone Encounter (Signed)
LVm for pt to call me back regarding her Procedure

## 2012-11-12 ENCOUNTER — Ambulatory Visit (HOSPITAL_COMMUNITY): Payer: Medicaid Other

## 2012-11-13 ENCOUNTER — Encounter: Payer: Medicaid Other | Admitting: Internal Medicine

## 2012-11-21 ENCOUNTER — Telehealth: Payer: Self-pay | Admitting: Gastroenterology

## 2012-11-21 ENCOUNTER — Other Ambulatory Visit: Payer: Self-pay | Admitting: Gastroenterology

## 2012-11-21 NOTE — Telephone Encounter (Signed)
Spoke to pt. She wanted me to send her new instructions for her procedure scheduled on 12/15/2012, I told her i would put them in the mail today

## 2012-11-25 ENCOUNTER — Ambulatory Visit (HOSPITAL_COMMUNITY)
Admission: RE | Admit: 2012-11-25 | Discharge: 2012-11-25 | Disposition: A | Discharge status: Home / Self Care | Payer: Medicaid Other | Source: Ambulatory Visit | Attending: Gynecologic Oncology | Admitting: Gynecologic Oncology

## 2012-11-25 DIAGNOSIS — C549 Malignant neoplasm of corpus uteri, unspecified: Secondary | ICD-10-CM

## 2012-11-25 DIAGNOSIS — Z9079 Acquired absence of other genital organ(s): Secondary | ICD-10-CM | POA: Insufficient documentation

## 2012-11-25 MED ORDER — IOHEXOL 300 MG/ML  SOLN
100.0000 mL | Freq: Once | INTRAMUSCULAR | Status: AC | PRN
  Administered 2012-11-25: 100 mL via INTRAVENOUS

## 2012-11-28 ENCOUNTER — Telehealth: Payer: Self-pay | Admitting: *Deleted

## 2012-11-28 NOTE — Telephone Encounter (Signed)
Pt notified of CT resluts.  RTC 12/02/12

## 2012-12-02 ENCOUNTER — Ambulatory Visit: Payer: Medicaid Other | Attending: Gynecologic Oncology | Admitting: Gynecologic Oncology

## 2012-12-02 ENCOUNTER — Encounter: Payer: Self-pay | Admitting: Gynecologic Oncology

## 2012-12-02 VITALS — BP 110/64 | HR 68 | Temp 98.7°F | Resp 18 | Ht 66.3 in | Wt 288.0 lb

## 2012-12-02 DIAGNOSIS — C549 Malignant neoplasm of corpus uteri, unspecified: Secondary | ICD-10-CM | POA: Insufficient documentation

## 2012-12-02 DIAGNOSIS — C541 Malignant neoplasm of endometrium: Secondary | ICD-10-CM

## 2012-12-02 NOTE — Patient Instructions (Addendum)
Stage IIIC1 Grade II endometrial cancer  Chemoradiation sandwich therapy was recommended.   Will schedule appointments with Drs.  Kinard and Livesay F/U in 6 weeks.

## 2012-12-02 NOTE — Progress Notes (Signed)
CC: Endometrial cancer  HPI:47 y/o G2 P1 LNMP  06/2012.  Patient reports intermittent spotting between menses for over 1 year.  Occasional menorrhagia.  Last pap more than two years prior.  20 pound weight loss over the past year.  Deborah [name] was referred to Dr. Marice Potter who collected an endometrial biopsy and a endocervical biopsy. Both are positive for endometrial adenocarcinoma.  On 10/07/2012 she underwent robotic hysterectomy bilateral salpingo-oophorectomy. At the time of surgery multiple cystic lesions were noted throughout the entire pelvis that was suspicious for metastatic disease, as such lymph node dissection was not collected but these lesions were biopsied. Final pathology was notable for  1. Soft tissue, biopsy, right para-colic gutter - BENIGN PARA-COLIC GUTTER SOFT TISSUE WITH BENIGN CYST (4.0 CM). SEE COMMENT. - NEGATIVE FOR MALIGNANCY. 2. Uterus +/- tubes/ovaries, neoplastic - ENDOMETRIAL ADENOCARCINOMA, ENDOMETRIOID-TYPE, SEE COMMENT. - TUMOR INVADES LESS THAN 1/2 OF MYOMETRIAL THICKNESS. - NO LYMPHOVASCULAR INVASION IDENTIFIED. - TUMOR INVADES INTO LOWER UTERINE SEGMENT. - UTERINE ADENOMYOSIS. - BENIGN RIGHT AND LEFT OVARIES; NO ATYPIA OR MALIGNANCY PRESENT. - BENIGN RIGHT AND LEFT FALLOPIAN TUBES WITH BENIGN SEROUS-TYPE PARATUBAL CYSTS. - BENIGN CERVIX; NEGATIVE FOR INTRAEPITHELIAL LESION OR MALIGNANCY. - BENIGN MULTILOCULAR UTERINE SEROSAL CYST. - SEE TUMOR SYNOPTIC TEMPLATE BELOW  1. In addition to the benign fibrovascular and adipose soft tissue, there is a multilocular cyst lined by tubal-type epithelium with cilia. There are no atypical or malignant epithelial features identified. Within the cyst wall,there are smaller cysts and glands lined by similar benign appearing epithelium. 2. UTERUS Specimen: Uterus and bilateral fallopian tubes and ovaries Procedure: Hysterectomy-bilateral salpingo-oophorectomy Lymph node sampling performed: Not performed Specimen  integrity: Intact Maximum tumor size (cm): See comment Histologic type: Adenocarcinoma Grade: II of III Myometrial invasion: 0.4 cm where myometrium is 3.2 cm in thickness (12.5%) Cervical stromal involvement: None Extent of involvement of other organs: None Lymph vascular invasion: Absent Peritoneal washings: N/A Lymph nodes: number examined 0; number positive N/A TNM code: pT1a, pN0, pMX Comments: Although there was not tumor grossly identified, sections from the entire endometrium demonstrate adenocarcinoma and complex hyperplasia with atypia spanning the entire 5.0 cm endometrial cavity. Tumor extensively involves adenomyosis. However, involvement of adenomyosis by tumor does not constitute myometrial invasion; regardless of depth of tumor involving adenomyosis. There are multiple foci of squamous metaplasia present within the tumor. The serosal cyst identified has identical histopathology to the cyst present in the right para-colic gutter and is benign.   Vaginal cuff dehiscence was appreciated in the PACU and she returned to the or the same evening for vaginal colporrhaphy.  CT abdomen and pelvis 11/25/2012  A tiny nonobstructing 1 mm calculus is seen in the lower pole of the right kidney. The other abdominal parenchymal organs are normal in appearance. A tiny less than 1 cm cholesterol gallstones seen, however there is no evidence of cholecystitis. No evidence of inflammatory process or abnormal fluid collections. No evidence of bowel wall thickening, dilatation, or hernia. No suspicious bone lesions are identified.  IMPRESSION:  1. Shotty bilateral external iliac lymphadenopathy measuring up to 1.3 cm, suspicious for metastatic disease. Nonspecific less than 5 mm retroperitoneal lymph nodes also noted in the left para-aortic region. 2. 2.7 cm postop lymphocele versus low attenuation lymphadenopathy  in the proximal left external iliac chain. 3. Cholelithiasis and right nephrolithiasis  incidentally noted.    Past Surgical Hx:  Past Surgical History  Procedure Date  . Hiatal hernia repair   . Repair vaginal cuff 10/07/2012  Procedure: REPAIR VAGINAL CUFF;  Surgeon: Laurette Schimke, MD PHD;  Location: [facility];  Service: Gynecology;;  . Robotic assisted total hysterectomy with bilateral salpingo oopherectomy 10/07/2012    Procedure: ROBOTIC ASSISTED TOTAL HYSTERECTOMY WITH BILATERAL SALPINGO OOPHORECTOMY;  Surgeon: Laurette Schimke, MD PHD;  Location: [facility];  Service: Gynecology;  Laterality: Bilateral;    Past Medical Hx:  Past Medical History  Diagnosis Date  . Congenital birth defect     Right hand  . Anemia   . Arthritis     knees  . Endometrial cancer   . Anxiety   . Swelling     ANKLES - TAKES LASIX  . Depression   . HLD (hyperlipidemia)     borderline  . Obesity     Past Gynecological History:  G2 P1 Menarche 52, Patient's last menstrual period was 09/20/2012. No h/o abn pap test  Family Hx:  Family History  Problem Relation Age of Onset  . Pancreatic cancer Maternal Grandfather     or liver cancer  . Hypertension Maternal Grandmother   . Diabetes Mother   . Colon polyps Maternal Aunt   . Colon cancer Cousin   . Lung cancer Maternal Aunt   . Hypertension Maternal Grandfather    Mat aunt lung cancer Dx 72 (tob use) Mat GF pancreatic cancer Mat cousin lung cancer dx 65's (tob use)  Review of Systems:  Constitutional  Feels well, Cardiovascular  No chest pain, shortness of breath, or edema  Pulmonary  No cough or wheeze.  Gastro Intestinal  No nausea, vomitting, or diarrhoea. Genito Urinary  No frequency, urgency, dysuria,no vaginal spotting or discharge Musculo Skeletal  No myalgia, arthralgia, joint swelling or pain   Physical Exam: BP 110/64  Pulse 68  Temp 98.7 F (37.1 C) (Oral)  Resp 18  Ht 5' 6.3" (1.684 m)  Wt 288 lb (130.636 kg)  BMI 46.07 kg/m2  LMP 09/20/2012  WD in NAD Neck  Abdomen  Normoactive bowel sounds,  abdomen soft, non-tender and morbidly obese. Surgical  sites intact without evidence of hernia.  PELVIC:  Nl EGBUS, Vaginal cuff intact.  No discharge, bleeding or tenderness.  Assessment/Plan:  Deborah [name]  is a 47 y.o.  year old with grade 2 endometrial adenocarcinoma noted on  the endometrial biopsy and endocervical curettage specimens. She underwent robotic total laparoscopic hysterectomy bilateral salpingo-oophorectomy. At the time of surgery diffuse miliary cystic-like lesions were noted in the cul-de-sac on on the rectosigmoid colon highly suspicious for metastatic disease. Because of these findings lymph node dissection was not performed. Final pathology is notable for grade 2 endometrioid endometrial adenocarcinoma with 12.5% myometrial invasion no lymphovascular space invasion no cervical involvement.  A CT scan of the abdomen and pelvis was collected to assess the pelvic and periaortic lymph nodes since these were not surgically assessed.  The imaging is suspicios for pelvic LN involvement.  She is stage IIIC1  Chemoradiation sandwich therapy was recommended.  15/20  minutes were spend in discussion of the surgical and CT findings as well as the treatment recomendations Will schedule appointments with Drs.  Kinard and Livesay F/U in 6 weeks.  Laurette Schimke, MD, PhD 12/02/2012, 1:50 PM

## 2012-12-05 ENCOUNTER — Other Ambulatory Visit: Payer: Self-pay | Admitting: Oncology

## 2012-12-05 ENCOUNTER — Telehealth: Payer: Self-pay | Admitting: *Deleted

## 2012-12-05 DIAGNOSIS — C541 Malignant neoplasm of endometrium: Secondary | ICD-10-CM

## 2012-12-05 NOTE — Telephone Encounter (Signed)
Tc to pt to review appts: Dr Darrold Span 12/17/12 @ 1430 and Dr Roselind Messier 12/29/12 @ 1030.  Pt acknowledges and accepts

## 2012-12-08 ENCOUNTER — Telehealth: Payer: Self-pay | Admitting: Oncology

## 2012-12-08 NOTE — Telephone Encounter (Signed)
C/D 12/08/12 for appt 12/17/12 °

## 2012-12-09 ENCOUNTER — Telehealth: Payer: Self-pay | Admitting: Oncology

## 2012-12-09 NOTE — Telephone Encounter (Signed)
pt called in to make her chemo classs appt,done

## 2012-12-09 NOTE — Telephone Encounter (Signed)
called and left mess. for pt to call,made chemo class for  pt per 1/10     anne

## 2012-12-10 ENCOUNTER — Telehealth: Payer: Self-pay | Admitting: Internal Medicine

## 2012-12-10 NOTE — Telephone Encounter (Signed)
Will C-B to res/yf

## 2012-12-11 ENCOUNTER — Other Ambulatory Visit: Payer: Medicaid Other

## 2012-12-13 ENCOUNTER — Other Ambulatory Visit: Payer: Self-pay | Admitting: Oncology

## 2012-12-15 ENCOUNTER — Encounter: Payer: Medicaid Other | Admitting: Internal Medicine

## 2012-12-16 ENCOUNTER — Other Ambulatory Visit: Payer: Medicaid Other

## 2012-12-16 ENCOUNTER — Encounter: Payer: Medicaid Other | Admitting: Cardiology

## 2012-12-16 ENCOUNTER — Encounter: Payer: Self-pay | Admitting: *Deleted

## 2012-12-17 ENCOUNTER — Telehealth: Payer: Self-pay | Admitting: *Deleted

## 2012-12-17 ENCOUNTER — Ambulatory Visit: Payer: Medicaid Other | Admitting: Oncology

## 2012-12-17 ENCOUNTER — Encounter: Payer: Self-pay | Admitting: Oncology

## 2012-12-17 ENCOUNTER — Other Ambulatory Visit: Payer: Medicaid Other | Admitting: Lab

## 2012-12-17 ENCOUNTER — Ambulatory Visit: Payer: Medicaid Other

## 2012-12-17 NOTE — Telephone Encounter (Signed)
Called patient to follow up on today's appointment. States she left a message with the scheduler this morning that she would not be able to come today due to diarrhea.

## 2012-12-17 NOTE — Progress Notes (Signed)
Medical Oncology  Patient cancelled new patient visit with Dr Darrold Span today due to diarrhea. She did attend chemo teaching class 12-16-12 and is to see Dr Roselind Messier on 2-3 and Dr Nelly Rout on 01-01-13. My next available spot including work in  for new patient  is 01-05-13; we will try to have first chemo set up for later that same week so as not to delay further. Gyn oncology staff notified.  Ila Mcgill, MD

## 2012-12-18 ENCOUNTER — Telehealth: Payer: Self-pay | Admitting: *Deleted

## 2012-12-18 ENCOUNTER — Other Ambulatory Visit: Payer: Self-pay

## 2012-12-18 ENCOUNTER — Telehealth: Payer: Self-pay | Admitting: Oncology

## 2012-12-18 NOTE — Telephone Encounter (Signed)
S/W pt in re NP appt 02/10 @ 9:30 w/Dr. Darrold Span

## 2012-12-18 NOTE — Telephone Encounter (Signed)
Per staff message and POF I have scheduled appts.  JMW  

## 2012-12-19 ENCOUNTER — Encounter: Payer: Self-pay | Admitting: Family Medicine

## 2012-12-19 ENCOUNTER — Ambulatory Visit (INDEPENDENT_AMBULATORY_CARE_PROVIDER_SITE_OTHER): Payer: Medicaid Other | Admitting: Cardiology

## 2012-12-19 ENCOUNTER — Encounter: Payer: Self-pay | Admitting: Cardiology

## 2012-12-19 ENCOUNTER — Ambulatory Visit (INDEPENDENT_AMBULATORY_CARE_PROVIDER_SITE_OTHER): Payer: Medicaid Other | Admitting: Family Medicine

## 2012-12-19 VITALS — BP 112/74 | HR 74 | Ht 66.0 in | Wt 288.0 lb

## 2012-12-19 VITALS — BP 107/72 | HR 94 | Temp 98.7°F | Ht 66.0 in | Wt 288.0 lb

## 2012-12-19 DIAGNOSIS — R072 Precordial pain: Secondary | ICD-10-CM | POA: Insufficient documentation

## 2012-12-19 DIAGNOSIS — E669 Obesity, unspecified: Secondary | ICD-10-CM

## 2012-12-19 DIAGNOSIS — G44209 Tension-type headache, unspecified, not intractable: Secondary | ICD-10-CM

## 2012-12-19 DIAGNOSIS — H5200 Hypermetropia, unspecified eye: Secondary | ICD-10-CM

## 2012-12-19 MED ORDER — TRAMADOL HCL 50 MG PO TABS: 50.0000 mg | ORAL_TABLET | Freq: Three times a day (TID) | ORAL | Status: DC | PRN

## 2012-12-19 NOTE — Progress Notes (Signed)
  Subjective:    Patient ID: Deborah [name], female    DOB: [birth year], 47 y.o.   MRN: [id number]  HPI  Deborah [name] comes in for follow up.  She has several complaints.   Vision- cannot read her phone, the phone book, other small print, has to hold them at arm's distance to read things.  Has noticed this over the past couple of months.   Headaches- having them on and off since September.  She does not see spots, no nausea, some noise sensitivity but no photosensitivity.  Has not identified triggers except stress.  The headache is bilateral, both temples.  It was worse with a URI a week ago. No vertigo, light headedness, syncope. Has tried ibuprofen for them but it has not helped.   Bump on neck- has been there about a week, had a cold.  No fevers, no drainage from the bump, but it is a little sore.   Obesity- has not lost any more weight, but is now concerned about checking her cholesterol.  She is not getting much exercise, but is trying to make diet changes.   Past Medical History  Diagnosis Date  . Congenital birth defect     Right hand  . Anemia   . Arthritis     knees  . Endometrial cancer   . Anxiety   . Swelling     ANKLES - TAKES LASIX  . Depression   . HLD (hyperlipidemia)     borderline  . Obesity    Family History  Problem Relation Age of Onset  . Pancreatic cancer Maternal Grandfather     or liver cancer  . Hypertension Maternal Grandmother   . Diabetes Mother   . Colon polyps Maternal Aunt   . Colon cancer Cousin   . Lung cancer Maternal Aunt   . Hypertension Maternal Grandfather    History  Substance Use Topics  . Smoking status: Never Smoker   . Smokeless tobacco: Never Used  . Alcohol Use: Yes     Comment: rare   Review of Systems See HPI    Objective:   Physical Exam BP 107/72  Pulse 94  Temp 98.7 F (37.1 C) (Oral)  Ht 5\' 6"  (1.676 m)  Wt 288 lb (130.636 kg)  BMI 46.48 kg/m2  LMP 09/20/2012 General appearance: alert, cooperative and no  distress Neck: There is a 1cm in diameter shotty lymph node on lateral right neck, no fluctuance or erythema.  Lungs: clear to auscultation bilaterally Heart: regular rate and rhythm, S1, S2 normal, no murmur, click, rub or gallop Pulses: 2+ and symmetric Neuro: CN II-XII in tact, normal strength, sensation, reflexes or upper extremities.  Coordination in tact.       Assessment & Plan:

## 2012-12-19 NOTE — Patient Instructions (Signed)
It was good to see you.  Please make a lab appointment to have your cholesterol checked.  Do not eat or drink anything but water before the blood is drawn.  You can try the tramadol for headaches, and also see the hand out about headaches.

## 2012-12-19 NOTE — Assessment & Plan Note (Signed)
Discussed far-sighted, loss of accomodation, as normal part of aging.  Suggested getting readers at the pharmacy.  Also thougt this may help with headaches.

## 2012-12-19 NOTE — Assessment & Plan Note (Signed)
No red flags in history or exam.  Discussed stress/emotinal problems as likely cause of headaches, which she is very agreeable with.  Reassured her that neuro exam normal.  Gave hand out on tension headaches.  Rx for tramadol as needed for pain.

## 2012-12-19 NOTE — Patient Instructions (Addendum)
The current medical regimen is effective;  continue present plan and medications.  Your physician has requested that you have a dobutamine echocardiogram. For further information please visit [email or web address] Please follow instruction sheet as given.  Follow up as directed

## 2012-12-19 NOTE — Assessment & Plan Note (Signed)
Stable over past few months- but has lost 10 lbs since last September.  Will check lipid profile.

## 2012-12-19 NOTE — Progress Notes (Signed)
HPI The patient presents for evaluation of chest discomfort. She was actually seen here in 2000 and and did have a stress perfusion study. I reviewed this demonstrated possibly a reduced ejection fraction of 47%. There was artifact. There was perhaps some hypoperfusion of the anterior septum. Cardiac catheterization was planned but this did not happen. The patient is not sure why.  She recently underwent hysterectomy for endometrial cancer. She is to have chemotherapy and radiation. She says that different providers listening to her thought they heard a "click" in her heart.  She's also been describing chest discomfort. This is a pressure in her upper chest. It happens sporadically. She notices it with certain positions. She's not sure that any change in frequency or intensity. She has been waking up feeling has to breathe fast but she's not describing classic PND orthopnea. She's not describing any new palpitations, presyncope or syncope. She's had no weight gain or edema. She doesn't exercise routinely although she does do some light household chores.  No Known Allergies  Current Outpatient Prescriptions  Medication Sig Dispense Refill  . aspirin 81 MG tablet Take 81 mg by mouth every morning.       . bifidobacterium infantis (ALIGN) capsule Take 1 capsule by mouth daily.  14 capsule  0  . furosemide (LASIX) 20 MG tablet Take 20 mg by mouth every morning.      . lactase (LACTAID) 3000 UNITS tablet Take 1 tablet (3,000 Units total) by mouth 3 (three) times daily with meals.  30 tablet  1  . sertraline (ZOLOFT) 100 MG tablet Take 100 mg by mouth every evening.      . traMADol (ULTRAM) 50 MG tablet Take 1 tablet (50 mg total) by mouth every 8 (eight) hours as needed for pain.  30 tablet  2    Past Medical History  Diagnosis Date  . Congenital birth defect     Right hand  . Anemia   . Arthritis     knees  . Endometrial cancer   . Anxiety   . Swelling     ANKLES - TAKES LASIX  .  Depression   . HLD (hyperlipidemia)     borderline  . Obesity     Past Surgical History  Procedure Date  . Hiatal hernia repair   . Repair vaginal cuff 10/07/2012    Procedure: REPAIR VAGINAL CUFF;  Surgeon: Laurette Schimke, MD PHD;  Location: [facility];  Service: Gynecology;;  . Robotic assisted total hysterectomy with bilateral salpingo oopherectomy 10/07/2012    Procedure: ROBOTIC ASSISTED TOTAL HYSTERECTOMY WITH BILATERAL SALPINGO OOPHORECTOMY;  Surgeon: Laurette Schimke, MD PHD;  Location: [facility];  Service: Gynecology;  Laterality: Bilateral;    Family History  Problem Relation Age of Onset  . Pancreatic cancer Maternal Grandfather     or liver cancer  . Hypertension Maternal Grandmother   . Diabetes Mother   . Colon polyps Maternal Aunt   . Colon cancer Cousin   . Lung cancer Maternal Aunt   . Hypertension Maternal Grandfather     History   Social History  . Marital Status: Single    Spouse Name: N/A    Number of Children: 1  . Years of Education: N/A   Occupational History  . homemaker    Social History Main Topics  . Smoking status: Never Smoker   . Smokeless tobacco: Never Used  . Alcohol Use: Yes     Comment: rare  . Drug Use: No  . Sexually  Active: Not Currently    Birth Control/ Protection: Condom   Other Topics Concern  . Not on file   Social History Narrative  . No narrative on file    ROS:  Positive for headaches and dizziness, urinary frequency, occasional constipation and diarrhea seasonal allergies, joint pains, varicose veins. Otherwise as stated in the history of present illness and negative for all other systems.  PHYSICAL EXAM BP 112/74  Pulse 74  Ht 5\' 6"  (1.676 m)  Wt 288 lb (130.636 kg)  BMI 46.48 kg/m2  LMP 09/20/2012 GENERAL:  Well appearing HEENT:  Pupils equal round and reactive, fundi not visualized, oral mucosa unremarkable NECK:  No jugular venous distention, waveform within normal limits, carotid upstroke brisk and symmetric,  no bruits, no thyromegaly LYMPHATICS:  No cervical, inguinal adenopathy LUNGS:  Clear to auscultation bilaterally BACK:  No CVA tenderness CHEST:  Unremarkable HEART:  PMI not displaced or sustained,S1 and S2 within normal limits, no S3, no S4, no clicks, no rubs, no murmurs ABD:  Flat, positive bowel sounds normal in frequency in pitch, no bruits, no rebound, no guarding, no midline pulsatile mass, no hepatomegaly, no splenomegaly EXT:  2 plus pulses throughout, no edema, no cyanosis no clubbing, right hand congenital abnormality SKIN:  No rashes no nodules NEURO:  Cranial nerves II through XII grossly intact, motor grossly intact throughout [facility]:  Cognitively intact, oriented to person place and time  EKG:  10/07/12 Sinus rhythm, rate 82, axis within normal limits, intervals within normal limits, no acute ST-T wave changes.  Abnormal R wave progression.   ASSESSMENT AND PLAN  Chest pain - The patient presents with chest discomfort that is somewhat atypical. She doesn't think she be a walk on a treadmill. She had perfusion study was difficult to evaluate in the past. Therefore, I will order a dobutamine echocardiogram. Further evaluation will be based on this.

## 2012-12-23 ENCOUNTER — Other Ambulatory Visit: Payer: Self-pay | Admitting: Family Medicine

## 2012-12-23 DIAGNOSIS — Z1231 Encounter for screening mammogram for malignant neoplasm of breast: Secondary | ICD-10-CM

## 2012-12-25 ENCOUNTER — Other Ambulatory Visit: Payer: Medicaid Other

## 2012-12-26 ENCOUNTER — Encounter: Payer: Self-pay | Admitting: Radiation Oncology

## 2012-12-26 NOTE — Progress Notes (Signed)
47 year old single female. G2P1.   Grade 2 endometrial adenocarcinoma. S/P laparoscopic hysterectomy bilateral salpingo oophorectomy. Diffuse miliary cystic like lesion were noted in the cul de sac on the rectosigmoid colon highly suspicious for metastatic disease. No lymphovascular space invasion and no cervical involvement. Dr. Nelly Rout recommends chemoradiation sandwich.   NKDA No hx of radiation therapy No indication of a pacemaker

## 2012-12-29 ENCOUNTER — Encounter: Payer: Self-pay | Admitting: Radiation Oncology

## 2012-12-29 ENCOUNTER — Telehealth: Payer: Self-pay | Admitting: *Deleted

## 2012-12-29 ENCOUNTER — Ambulatory Visit
Admission: RE | Admit: 2012-12-29 | Discharge: 2012-12-29 | Disposition: A | Discharge status: Home / Self Care | Payer: Medicaid Other | Source: Ambulatory Visit | Attending: Radiation Oncology | Admitting: Radiation Oncology

## 2012-12-29 VITALS — BP 134/84 | HR 101 | Temp 98.0°F | Resp 18 | Ht 66.0 in | Wt 296.0 lb

## 2012-12-29 DIAGNOSIS — C549 Malignant neoplasm of corpus uteri, unspecified: Secondary | ICD-10-CM | POA: Insufficient documentation

## 2012-12-29 DIAGNOSIS — C541 Malignant neoplasm of endometrium: Secondary | ICD-10-CM

## 2012-12-29 NOTE — Progress Notes (Signed)
Radiation Oncology         [phone] ________________________________  Initial outpatient Consultation  Name: Deborah [name] MRN: [id number]  Date: 12/29/2012  DOB: Apr 12, 1966  WU:[id number],FAOZHY, MD  Laurette Schimke, MD PHD   REFERRING PHYSICIAN: Laurette Schimke, MD PHD  DIAGNOSIS: The encounter diagnosis was Endometrial cancer. stage III-C1 grade 2 endometrial adenocarcinoma  HISTORY OF PRESENT ILLNESS::Deborah [name] is a 47 y.o. female who is seen out of the courtesy of Dr. Laurette Schimke for an opinion concerning radiation therapy as part of management of the patient's advanced endometrial carcinoma.  She presented with vaginal bleeding and on biopsy was found to have  grade 2 endometrial adenocarcinoma noted on the endometrial biopsy and endocervical curettage specimens. She underwent robotic total laparoscopic hysterectomy bilateral salpingo-oophorectomy. At the time of surgery diffuse miliary cystic-like lesions were noted in the cul-de-sac on on the rectosigmoid colon highly suspicious for metastatic disease. Because of these findings lymph node dissection was not performed. Final pathology is notable for grade 2 endometrioid endometrial adenocarcinoma with 12.5% myometrial invasion no lymphovascular space invasion no cervical involvement. Biopsy of the pericolic gutter revealed a benign cyst.  A CT scan of the abdomen and pelvis was performed to assess the pelvic and periaortic lymph nodes since these were not surgically assessed. The imaging is suspicios for pelvic LN involvement. The patient is now being seen to consider postoperative treatments.   PREVIOUS RADIATION THERAPY: No  PAST MEDICAL HISTORY:  has a past medical history of Congenital birth defect; Anemia; Arthritis; Endometrial cancer; Anxiety; Swelling; Depression; HLD (hyperlipidemia); and Obesity.    PAST SURGICAL HISTORY: Past Surgical History  Procedure Date  . Hiatal hernia repair   . Repair vaginal cuff  10/07/2012    Procedure: REPAIR VAGINAL CUFF;  Surgeon: Laurette Schimke, MD PHD;  Location: [facility];  Service: Gynecology;;  . Robotic assisted total hysterectomy with bilateral salpingo oopherectomy 10/07/2012    Procedure: ROBOTIC ASSISTED TOTAL HYSTERECTOMY WITH BILATERAL SALPINGO OOPHORECTOMY;  Surgeon: Laurette Schimke, MD PHD;  Location: [facility];  Service: Gynecology;  Laterality: Bilateral;  . Abdominal hysterectomy 10/2011    complete    FAMILY HISTORY: family history includes Cancer in her cousin, maternal aunt, and maternal grandfather; Colon cancer in her cousin; Colon polyps in her maternal aunt; Diabetes in her mother; Hypertension in her maternal grandfather and maternal grandmother; Lung cancer in her maternal aunt; and Pancreatic cancer in her maternal grandfather.  SOCIAL HISTORY:  reports that she has never smoked. She has never used smokeless tobacco. She reports that she drinks alcohol. She reports that she does not use illicit drugs.  ALLERGIES: Review of patient's allergies indicates no known allergies.  MEDICATIONS:  Current Outpatient Prescriptions  Medication Sig Dispense Refill  . aspirin 81 MG tablet Take 81 mg by mouth every morning.       . bifidobacterium infantis (ALIGN) capsule Take 1 capsule by mouth daily.  14 capsule  0  . furosemide (LASIX) 20 MG tablet Take 20 mg by mouth every morning.      . lactase (LACTAID) 3000 UNITS tablet Take 1 tablet (3,000 Units total) by mouth 3 (three) times daily with meals.  30 tablet  1  . sertraline (ZOLOFT) 100 MG tablet Take 100 mg by mouth every evening.      . traMADol (ULTRAM) 50 MG tablet Take 1 tablet (50 mg total) by mouth every 8 (eight) hours as needed for pain.  30 tablet  2    REVIEW OF  SYSTEMS:  A 15 point review of systems is documented in the electronic medical record. This was obtained by the nursing staff. However, I reviewed this with the patient to discuss relevant findings and make appropriate changes.  The  patient presented with abnormal bleeding associated with her menstrual cycle.  She denies any pain in the pelvis area urination difficulties or bowel complaints. She denies any vaginal bleeding or discharge at this time. She has minimal soreness in the laparoscopic scar areas.   PHYSICAL EXAM:  height is 5\' 6"  (1.676 m) and weight is 296 lb (134.265 kg). Her oral temperature is 98 F (36.7 C). Her blood pressure is 134/84 and her pulse is 101. Her respiration is 18.  this is a very pleasant 47 year old female in no acute distress. The pupils are equal round and reactive to light. The extraocular eye movements are intact. The tongue is midline.   patient has a tongue ring in place. There is no secondary infection noted in the oral cavity or posterior pharynx. She also has a ring involving the left eyebrow region.   the neck and supraclavicular regions are free of adenopathy.  the axillary areas are free of adenopathy. The lungs are clear to auscultation. The heart has a regular rhythm and rate. The abdomen is soft and nontender with normal bowel sounds. The patient's laparoscopic scars are well healed at this time without signs of drainage or infection. A pelvic exam is deferred until simulation and planning date.   Examination of the extremities reveals some edema and ankle and foot areas. Peripheral pulses appear to be adequate. On neurological examination motor strength is 5 out of 5 in the proximal and distal muscle groups in the upper lower extremities. The patient has birth defect involving her right forearm and hand.   LABORATORY DATA:  Lab Results  Component Value Date   WBC 11.1* 10/08/2012   HGB 12.4 10/08/2012   HCT 37.7 10/08/2012   MCV 85.3 10/08/2012   PLT 296 10/08/2012   Lab Results  Component Value Date   NA 134* 10/08/2012   K 3.3* 10/08/2012   CL 98 10/08/2012   CO2 27 10/08/2012   Lab Results  Component Value Date   ALT 14 10/03/2012   AST 15 10/03/2012   ALKPHOS 74  10/03/2012   BILITOT 0.4 10/03/2012     RADIOGRAPHY: CT abdomen and pelvis with contrast IMPRESSION:  1. Shotty bilateral external iliac lymphadenopathy measuring up to  1.3 cm, suspicious for metastatic disease. Nonspecific less than 5  mm retroperitoneal lymph nodes also noted in the left para-aortic  region.  2. 2.7 cm postop lymphocele versus low attenuation lymphadenopathy  in the proximal left external iliac chain.  3. Cholelithiasis and right nephrolithiasis incidentally noted.      IMPRESSION:  Stage III-C1 grade 2 endometrial adenocarcinoma.  She would be a good candidate for combination therapy with pelvic radiation therapy along with systemic chemotherapy. In addition given the involvement of the endocervical canal at the time of biopsy as well as location within the lower uterine segment, I would recommend intracavitary brachytherapy treatments as part of her management  PLAN: The patient will proceed with 3 cycles of chemotherapy and then return the for reevaluation in radiation oncology to consider treatments as above. I spent 60 minutes minutes face to face with the patient and more than 50% of that time was spent in counseling and/or coordination of care.   ------------------------------------------------  -----------------------------------  Billie Lade, PhD, MD

## 2012-12-29 NOTE — Progress Notes (Signed)
Patient presents to the clinic today unaccompanied for a consultation with Dr. Roselind Messier to discuss the role of radiation therapy in the treatment of grade 2 endometrial adenocarcinoma. Patient alert and oriented to person, place, and time. No distress noted. Steady gait noted. Pleasant affect noted. Patient denies pain at this time. Patient very anxious, talkative and tearful during out interaction. Patient denies burning with urination. Patient denies hematuria. Patient reports clear yellow urine. Patient denies vaginal discharge. Patient reports occasional mild headaches. Patient denies dizziness, nausea, vomiting, or constipation. Patient reports occasional episodes of diarrhea. Patient reports that she is unable to sleep well at night because her mind stays busy. Reported all findings to Dr. Roselind Messier.

## 2012-12-29 NOTE — Telephone Encounter (Signed)
Pt cancelled her COLON on 12/15/12 and called today to ask if she could schedule it prior to starting her Chemo or can she have it done while getting her Chemo. Explained to pt that Dr Rhea Belton has an opening on 2//6/14 and if she can work it out, we can add her on. I do not thing Dr Darrold Span will allow her to have a COLON during chemo d/t risk of bleeding and infection. She is already scheduled for chemo, but if her 1st tx is delayed, we can work with her. Pt stated understanding.

## 2012-12-29 NOTE — Progress Notes (Signed)
Complete PATIENT MEASURE OF DISTRESS worksheet with a score of 6 submitted to social work. 

## 2012-12-29 NOTE — Progress Notes (Signed)
See progress note under physician encounter. 

## 2012-12-31 ENCOUNTER — Telehealth: Payer: Self-pay | Admitting: Oncology

## 2013-01-01 ENCOUNTER — Ambulatory Visit: Payer: Medicaid Other | Attending: Gynecologic Oncology | Admitting: Gynecologic Oncology

## 2013-01-01 ENCOUNTER — Encounter: Payer: Self-pay | Admitting: Gynecologic Oncology

## 2013-01-01 VITALS — BP 124/80 | HR 68 | Temp 98.8°F | Resp 16 | Ht 66.3 in | Wt 292.1 lb

## 2013-01-01 DIAGNOSIS — C549 Malignant neoplasm of corpus uteri, unspecified: Secondary | ICD-10-CM | POA: Insufficient documentation

## 2013-01-01 DIAGNOSIS — C541 Malignant neoplasm of endometrium: Secondary | ICD-10-CM

## 2013-01-01 NOTE — Patient Instructions (Addendum)
Follow up with Dr. Darrold Span next week for initiation of Taxol carboplatin therapy.   Followup after completion of 3 cycles of chemotherapy

## 2013-01-01 NOTE — Progress Notes (Signed)
CC: Endometrial cancer  HPI:46 y/o G2 P1 LNMP  06/2012.  Patient reports intermittent spotting between menses for over 1 year.  Occasional menorrhagia.  Last pap more than two years prior.  20 pound weight loss over the past year.  Deborah [name] was referred to Dr. Marice Potter who collected an endometrial biopsy and a endocervical biopsy. Both are positive for endometrial adenocarcinoma.  On 10/07/2012 she underwent robotic hysterectomy bilateral salpingo-oophorectomy. At the time of surgery multiple cystic lesions were noted throughout the entire pelvis that was suspicious for metastatic disease, as such lymph node dissection was not collected but these lesions were biopsied. Final pathology was notable for  1. Soft tissue, biopsy, right para-colic gutter - BENIGN PARA-COLIC GUTTER SOFT TISSUE WITH BENIGN CYST (4.0 CM). SEE COMMENT. - NEGATIVE FOR MALIGNANCY. 2. Uterus +/- tubes/ovaries, neoplastic - ENDOMETRIAL ADENOCARCINOMA, ENDOMETRIOID-TYPE, SEE COMMENT. - TUMOR INVADES LESS THAN 1/2 OF MYOMETRIAL THICKNESS. - NO LYMPHOVASCULAR INVASION IDENTIFIED. - TUMOR INVADES INTO LOWER UTERINE SEGMENT. - UTERINE ADENOMYOSIS. - BENIGN RIGHT AND LEFT OVARIES; NO ATYPIA OR MALIGNANCY PRESENT. - BENIGN RIGHT AND LEFT FALLOPIAN TUBES WITH BENIGN SEROUS-TYPE PARATUBAL CYSTS. - BENIGN CERVIX; NEGATIVE FOR INTRAEPITHELIAL LESION OR MALIGNANCY. - BENIGN MULTILOCULAR UTERINE SEROSAL CYST. - SEE TUMOR SYNOPTIC TEMPLATE BELOW  CT abdomen and pelvis 11/25/2012  IMPRESSION:  1. Shotty bilateral external iliac lymphadenopathy measuring up to 1.3 cm, suspicious for metastatic disease. Nonspecific less than 5 mm retroperitoneal lymph nodes also noted in the left para-aortic region. 2. 2.7 cm postop lymphocele versus low attenuation lymphadenopathy  in the proximal left external iliac chain. 3. Cholelithiasis and right nephrolithiasis incidentally noted.    Past Surgical Hx:  Past Surgical History  Procedure Date  .  Hiatal hernia repair   . Repair vaginal cuff 10/07/2012    Procedure: REPAIR VAGINAL CUFF;  Surgeon: Laurette Schimke, MD PHD;  Location: [facility];  Service: Gynecology;;  . Robotic assisted total hysterectomy with bilateral salpingo oopherectomy 10/07/2012    Procedure: ROBOTIC ASSISTED TOTAL HYSTERECTOMY WITH BILATERAL SALPINGO OOPHORECTOMY;  Surgeon: Laurette Schimke, MD PHD;  Location: [facility];  Service: Gynecology;  Laterality: Bilateral;  . Abdominal hysterectomy 10/2011    complete    Past Medical Hx:  Past Medical History  Diagnosis Date  . Congenital birth defect     Right hand  . Anemia   . Arthritis     knees  . Endometrial cancer   . Anxiety   . Swelling     ANKLES - TAKES LASIX  . Depression   . HLD (hyperlipidemia)     borderline  . Obesity     Past Gynecological History:  G2 P1 Menarche 83, Patient's last menstrual period was 09/20/2012. No h/o abn pap test  Family Hx:  Family History  Problem Relation Age of Onset  . Pancreatic cancer Maternal Grandfather     or liver cancer  . Hypertension Maternal Grandfather   . Cancer Maternal Grandfather     pancreatic  . Hypertension Maternal Grandmother   . Diabetes Mother   . Colon polyps Maternal Aunt   . Cancer Maternal Aunt     lung  . Colon cancer Cousin   . Cancer Cousin     lung  . Lung cancer Maternal Aunt    Mat aunt lung cancer Dx 57 (tob use) Mat GF pancreatic cancer Mat cousin lung cancer dx 44's (tob use)  Review of Systems:  Constitutional  Feels well, Cardiovascular  No chest pain, shortness of breath, or edema  Pulmonary  No cough or wheeze.  Gastro Intestinal  No nausea, vomitting, or diarrhoea. Genito Urinary  No frequency, urgency, dysuria,no vaginal spotting or discharge Musculo Skeletal  No myalgia, arthralgia, joint swelling or pain   Physical Exam: BP 124/80  Pulse 68  Temp 98.8 F (37.1 C) (Oral)  Resp 16  Ht 5' 6.3" (1.684 m)  Wt 292 lb 1.6 oz (132.496 kg)  BMI 46.72  kg/m2  LMP 09/20/2012  WD in NAD Abdomen :  Soft nontender. Trocar sites done in the intensive mass or hernia. Normoactive bowel sounds, abdomen soft, non-tender and morbidly obese.  PELVIC:  Nl EGBUS, Vaginal cuff intact.  No discharge, bleeding or tenderness.  Assessment/Plan:  Deborah [name]  is a 47 y.o.  year old with grade 2 endometrial adenocarcinoma noted on  the endometrial biopsy and endocervical curettage specimens. She underwent robotic total laparoscopic hysterectomy bilateral salpingo-oophorectomy. At the time of surgery diffuse miliary cystic-like lesions were noted in the cul-de-sac on on the rectosigmoid colon highly suspicious for metastatic disease. Because of these findings lymph node dissection was not performed. Final pathology is notable for grade 2 endometrioid endometrial adenocarcinoma with 12.5% myometrial invasion no lymphovascular space invasion no cervical involvement.  A CT scan of the abdomen and pelvis was collected to assess the pelvic and periaortic lymph nodes since these were not surgically assessed.  The imaging is suspicios for pelvic LN involvement.  She is stage IIIC1  Chemoradiation sandwich therapy is recommended.  She's seen Dr. Trenton Founds who plans to administer external beam pelvic radiotherapy in addition to the vaginal cuff brachytherapy.   She's scheduled to follow up with Dr. Darrold Span next week for initiation of Taxol carboplatin therapy.   Followup after completion of 3 cycles of chemotherapy Laurette Schimke, MD, PhD 01/01/2013, 4:33 PM

## 2013-01-02 ENCOUNTER — Other Ambulatory Visit: Payer: Medicaid Other

## 2013-01-02 ENCOUNTER — Telehealth: Payer: Self-pay | Admitting: *Deleted

## 2013-01-02 DIAGNOSIS — E669 Obesity, unspecified: Secondary | ICD-10-CM

## 2013-01-02 LAB — LIPID PANEL
HDL: 45 mg/dL (ref >39)
LDL Cholesterol: 125 mg/dL — ABNORMAL HIGH (ref 0–99)
Total CHOL/HDL Ratio: 4.1 Ratio
Triglycerides: 75 mg/dL (ref <150)
VLDL: 15 mg/dL (ref 0–40)

## 2013-01-02 NOTE — Telephone Encounter (Signed)
Britta Mccreedy from transportation called to confirm some visits for the patient. I confirmed athe appts. Dates in question were 10/16/12, 12/16/12, 12/29/12 and 01/21/13. JWM

## 2013-01-02 NOTE — Progress Notes (Signed)
FLP DONE TODAY Deborah [name] 

## 2013-01-02 NOTE — Addendum Note (Signed)
Encounter addended by: Ceaser Ebeling Mintz Sharronda Schweers, RN on: 01/02/2013  6:03 PM<BR>     Documentation filed: Charges VN

## 2013-01-05 ENCOUNTER — Other Ambulatory Visit (HOSPITAL_BASED_OUTPATIENT_CLINIC_OR_DEPARTMENT_OTHER): Payer: Medicaid Other | Admitting: Lab

## 2013-01-05 ENCOUNTER — Telehealth: Payer: Self-pay | Admitting: Oncology

## 2013-01-05 ENCOUNTER — Telehealth: Payer: Self-pay | Admitting: *Deleted

## 2013-01-05 ENCOUNTER — Ambulatory Visit (HOSPITAL_BASED_OUTPATIENT_CLINIC_OR_DEPARTMENT_OTHER): Payer: Medicaid Other | Admitting: Oncology

## 2013-01-05 ENCOUNTER — Encounter: Payer: Self-pay | Admitting: Oncology

## 2013-01-05 VITALS — BP 128/87 | HR 92 | Temp 97.9°F | Resp 20 | Ht 66.0 in | Wt 291.6 lb

## 2013-01-05 DIAGNOSIS — C541 Malignant neoplasm of endometrium: Secondary | ICD-10-CM

## 2013-01-05 DIAGNOSIS — C549 Malignant neoplasm of corpus uteri, unspecified: Secondary | ICD-10-CM

## 2013-01-05 DIAGNOSIS — R0789 Other chest pain: Secondary | ICD-10-CM

## 2013-01-05 LAB — CBC WITH DIFFERENTIAL/PLATELET
BASO%: 0.3 % (ref 0.0–2.0)
EOS%: 2.9 % (ref 0.0–7.0)
HGB: 13.4 g/dL (ref 11.6–15.9)
MCH: 28.4 pg (ref 25.1–34.0)
MCHC: 33.2 g/dL (ref 31.5–36.0)
RBC: 4.74 10*6/uL (ref 3.70–5.45)
RDW: 16.1 % — ABNORMAL HIGH (ref 11.2–14.5)
lymph#: 1.9 10*3/uL (ref 0.9–3.3)

## 2013-01-05 LAB — COMPREHENSIVE METABOLIC PANEL (CC13)
ALT: 15 U/L (ref 0–55)
AST: 9 U/L (ref 5–34)
Albumin: 3.1 g/dL — ABNORMAL LOW (ref 3.5–5.0)
Alkaline Phosphatase: 75 U/L (ref 40–150)
Calcium: 9.2 mg/dL (ref 8.4–10.4)
Chloride: 103 mEq/L (ref 98–107)
Potassium: 3.6 mEq/L (ref 3.5–5.1)

## 2013-01-05 NOTE — Progress Notes (Signed)
[facility] NEW PATIENT EVALUATION   Name: Deborah [name] Date: 01/05/2013 MRN: [id number] DOB: 1966/07/11  REFERRING PHYSICIAN:W.Brewster Cc J.Kinard, M.[place]il[place] (PCP [facility]) J.Hochrein   REASON FOR REFERRAL: recently diagnosed IIIC1endometrial carcinoma, for chemotherapy    HISTORY OF PRESENT ILLNESS:Deborah [name] is a 47 y.o. female who is see in consultation, alone for visit, at the request of Dr Laurette Schimke, for consideration of chemotherapy for recently diagnosed IIIC endometrial carcinoma.  Patient had presented with spotting and menorrhagia, last PAP 2 years ago and 20 lb weight loss in past year. She had biopsies by Dr Nicholaus Bloom 09-22-2012, with pathology [phone], see below) showing endometroid adenocarcinoma in endometrial biopsy and in endocervical curettage. She went to robotic hysterectomy with BSO by Dr Nelly Rout on 10-07-12 (no lymph nodes sampled as serosal cysts appeared malignant). Pathology [id number], see below) showed grade 2 endometroid adenocarcinoma with involvement of < 1/2 of myometrium, no LVSI, did involve into lower uterine segment, ovaries and tubes benign and the serosal cysts benign. CT AP 11-25-12 showed shotty bilateral external iliac nodes up to 1.3 cm and nonspecific < 5 mm let para-aortic nodes. She saw Dr Nelly Rout following the CT, with recommendation for chemotherapy and radiation in sandwich fashion. Patient cancelled her new patient visit with me in Jan due to diarrhea, which resolved apparently without intervention. She had consultation visit with Dr Roselind Messier on 12-29-12, who recommends pelvic radiation and intracavitary brachytherapy; she will have reevaluation by Dr Roselind Messier after 3 cycles of chemotherapy. She attended chemotherapy education class prior to visit today.    REVIEW OF SYSTEMS:  Occasional tension HA, with prn tramadol recently prescribed by PCP Reading glasses Occasional sinus symptoms No known dental  problems, does have dentist locally No difficulty hearing No thyroid problems No respiratory problems Occasional anterior chest pain, not associated with activity, last when she was sitting watching TV. Evaluation in process by cardiology with this not obviously cardiac but dobutamine echo scheduled for 01-08-13 Attempting weight loss but has gained 2-3 lbs recently No GERD. Bowels moving regularly now. Feels oral iron causing diarrhea. No bladder symptoms No LE swelling. No bleeding. Did not use lovenox after surgery IV access has not been difficult per patient  ALLERGIES: NKDA  PAST MEDICAL HISTORY:  has a past medical history of Congenital birth defect; Anemia; Arthritis; Endometrial cancer; Anxiety; Swelling; Depression; HLD (hyperlipidemia); and Obesity.   G2P1 Congenital birth defect right hand Hiatal hernia repair Degenerative arthritis knees and back Elevated lipids No colonoscopy, but expects this to be done by Dr Rhea Belton after chemo/RT complete No mammograms, but these are scheduled  Evaluation by Dr Antoine Poche 12-19-12 for atypical chest discomfort, with dobutamine echo scheduled for 01-08-13.   CURRENT MEDICATIONS:  Reviewed as listed in EMR. Decadron, zofran, compazine sent to [facility]. Written and oral instructions given. She will try ferrous sulfate on empty stomach with OJ two times weekly.    SOCIAL HISTORY:  reports that she has never smoked. She has never used smokeless tobacco. She reports that  drinks alcohol. She reports that she does not use illicit drugs. Originally from [place]. Lives alone, son age 70 also lives in [place] but works. Homemaker. Plans to use transportation service for chemo.  FAMILY HISTORY: No gyn or breast cancer. Grandfather with pancreatic vs liver ca. Mother DM, HTN, abdominal aneurysm. 1 brother, 3 sisters, son healthy. Colon cancer in cousin and lung ca in aunt (smoker).   LABORATORY DATA 01-05-2013 CBC with WBC  5.3, ANC 2.8, Hgb 13.4, plt 239k, MCV 85.6  CMET normal with exception of albumin of 3.1, including BUN 11, creat 0.8, normal electrolytes and LFTs.   PATHOLOGY   Accession #: [id number] Collected Date: 09/22/2012 Received Date: 09/23/2012  REPORT OF SURGICAL PATHOLOGY FINAL DIAGNOSIS Diagnosis 1. Endometrium, biopsy - POSITIVE FOR ENDOMETRIAL ADENOCARCINOMA. - SEE COMMENT. 2. Endocervix, curettage - POSITIVE FOR ENDOMETRIAL ADENOCARCINOMA. - BENIGN ENDOCERVICAL MUCOSA PRESENT. Microscopic Comment 1. Although definitive typing and grading is performed on hysterectomy specimen, as sampled, the endometrial adenocarcinoma appears to be endometrioid type and FIGO grade II. Dr. Frederica Kuster has seen the endometrial biopsy in consultation with agreement.      Accession #: [id number] Collected Date: 10/08/2012 Received Date: 10/08/2012 Diagnosis 1. Soft tissue, biopsy, right para-colic gutter - BENIGN PARA-COLIC GUTTER SOFT TISSUE WITH BENIGN CYST (4.0 CM). SEE COMMENT. - NEGATIVE FOR MALIGNANCY. 2. Uterus +/- tubes/ovaries, neoplastic - ENDOMETRIAL ADENOCARCINOMA, ENDOMETRIOID-TYPE, SEE COMMENT. - TUMOR INVADES LESS THAN 1/2 OF MYOMETRIAL THICKNESS. - NO LYMPHOVASCULAR INVASION IDENTIFIED. - TUMOR INVADES INTO LOWER UTERINE SEGMENT. - UTERINE ADENOMYOSIS. - BENIGN RIGHT AND LEFT OVARIES; NO ATYPIA OR MALIGNANCY PRESENT. - BENIGN RIGHT AND LEFT FALLOPIAN TUBES WITH BENIGN SEROUS-TYPE PARATUBAL CYSTS. - BENIGN CERVIX; NEGATIVE FOR INTRAEPITHELIAL LESION OR MALIGNANCY. - BENIGN MULTILOCULAR UTERINE SEROSAL CYST. - SEE TUMOR SYNOPTIC TEMPLATE BELOW Microscopic Comment 1. In addition to the benign fibrovascular and adipose soft tissue, there is a multilocular cyst lined by tubal-type epithelium with cilia. There are no atypical or malignant epithelial features identified. Within the cyst wall, there are smaller cysts and glands lined by similar benign appearing epithelium. 2.  UTERUS Specimen: Uterus and bilateral fallopian tubes and ovaries Procedure: Hysterectomy-bilateral salpingo-oophorectomy Lymph node sampling performed: Not performed Specimen integrity: Intact Maximum tumor size (cm): See comment Histologic type: Adenocarcinoma Grade: II of III Myometrial invasion: 0.4 cm where myometrium is 3.2 cm in thickness Cervical stromal involvement: None Extent of involvement of other organs: None Lymph vascular invasion: Absent 1 of 3 FINAL for EMMORY, SOLIVAN [phone]) Microscopic Comment(continued) Peritoneal washings: N/A Lymph nodes: number examined 0; number positive N/A TNM code: pT1a, pN0, pMX FIGO Stage (based on pathologic findings, needs clinical correlation): Pending Comments: Although there was not tumor grossly identified, sections from the entire endometrium demonstrate adenocarcinoma and complex hyperplasia with atypia spanning the entire 5.0 cm endometrial cavity. Tumor extensively involves adenomyosis. However, involvement of adenomyosis by tumor does not constitute myometrial invasion; regardless of depth of tumor involving adenomyosis. There are multiple foci of squamous metaplasia present within the tumor. The serosal cyst identified has identical histopathology to the cyst present in the right para-colic gutter and is benign. RADIOGRAPHY:    CT ABDOMEN AND PELVIS WITH CONTRAST 11-25-12 Technique: Multidetector CT imaging of the abdomen and pelvis was  performed following the standard protocol during bolus  administration of intravenous contrast.  Contrast: OMNIPAQUE IOHEXOL 300 MG/ML SOLN  Comparison: None.  Findings: Postsurgical changes seen from hysterectomy. Shotty lymph  nodes are seen in the external iliac chains bilaterally, largest on  the left measuring 1.3 cm in short axis. This is suspicious for  metastatic disease. A fluid attenuation density is seen in the  proximal left external iliac lymph node chain which  measures 1.9 x  2.7 cm. This has the appearance of a postop fluid collection such  as a lymphocele, although low attenuation lymphadenopathy cannot be  excluded. Tiny less than 5 mm retroperitoneal lymph nodes are seen  in the left para-aortic region.  No other lymphadenopathy  identified.  No evidence of hydronephrosis. A tiny nonobstructing 1 mm calculus  is seen in the lower pole of the right kidney. The other abdominal  parenchymal organs are normal in appearance. A tiny less than 1 cm  cholesterol gallstones seen, however there is no evidence of  cholecystitis.  No evidence of inflammatory process or abnormal fluid collections.  No evidence of bowel wall thickening, dilatation, or hernia. No  suspicious bone lesions are identified.  IMPRESSION:  1. Shotty bilateral external iliac lymphadenopathy measuring up to  1.3 cm, suspicious for metastatic disease. Nonspecific less than 5  mm retroperitoneal lymph nodes also noted in the left para-aortic  region.  2. 2.7 cm postop lymphocele versus low attenuation lymphadenopathy  in the proximal left external iliac chain.  3. Cholelithiasis and right nephrolithiasis incidentally noted.  CHEST - 2 VIEW 10-03-2012 Comparison: None.  Findings: Heart and mediastinal contours are within normal limits.  The lung fields appear clear with no signs of focal infiltrate or  congestive failure. No pleural fluid or significant peribronchial  cuffing is identified. Bony structures appear intact.  IMPRESSION:  No worrisome focal or acute cardiopulmonary abnormality seen.     PHYSICAL EXAM:  Weight 291 lb 9 oz, height 5'6" for BSA 2.48, BP 128/87, HR 92 regular, resp 20 not labored, temp 97.9 Very pleasant, anxious, talkative lady looks stated age, morbidly obese. Ambulatory, respirations not labored RA. HEENT: PERRL, not icteric, oral mucosa moist and clear, no obvious active dental problems, normal hair pattern. No thyroid mass appreciated, no  JVD. Lymphatics: no cervical, supraclavicular or axillary adenopathy. Difficult to examine inguinal nodes with habitus. Breasts bilaterally without dominant mass, skin or nipple findings. Axillae benign Heart RRR no gallop Anterior chest wall symmetrical, no rash, not tender to palpation Lungs with slightly decreased BS in bases with size otherwise clear to A and P Abdomen obese, soft, normal bowel sounds, robotic surgical incisions well healed. Cannot appreciate organomegaly or mass. Nontender LE without edema, cords, tenderness. Right hand deformity with 2 digits. Neuro nonfocal Skin without rash or ecchymosis   We have discussed history as above and the recommendation for chemotherapy with radiation. I have reviewed side effects of chemotherapy, including taxol aches and nausea. We have discussed medications to be used with chemotherapy. She understands that she can call at any time if questions or concerns between visits. She is understandably anxious and likely will need support and reinforcement of education from staff as treatment begins.  Cycle 1 taxol Ledell Noss will be given on 01-07-13 and I will see her back on 01-13-13 or sooner if needed. With ANC 2.8 today, I expect that she will need gCSF at 2-18 visit.   IMPRESSION / PLAN:  1. IIIC endometrial carcinoma involving lower uterine segment: post  Robotic hysterectomy/ BSO 10-07-12, with benign serosal cysts but some adenopathy by subsequent CT. Recommendation for taxol/ carbo q 3 weeks with pelvic RT after cycle 3, then likely HDR with last 3 cycles of chemo. Cycle 1 chemo scheduled for 01-07-13. 2.morbid obesity 3.atypical chest pain: I have reviewed Dr Hochrein's note carefully. Patient appears stable to proceed with chemotherapy rather than further delay for the upcoming echocardiogram. 4.congenital birth defect right hand 5.degenerative arthritis  Patient is in agreement with plan and has had questions answered to her satisfaction  now. Time spent 55+ min Zakhi Dupre P, MD 01/05/2013 8:35 AM

## 2013-01-05 NOTE — Patient Instructions (Addendum)
We will send prescriptions for the medicines you will need with chemotherapy to [facility]:  Decadron (dexamethasone, steroid) that you will take night before chemo each time, zofran (ondansetron) for nausea that will not make you sleepy, and compazine (prochlorperazine) also for nausea as needed.  Your first chemo will be at ~ 9:45 AM on 01-07-13.  Twelve hours before chemo, at ~ 9:30 PM on 01-06-13, you need to eat a little and take the decadron five tablets (= 20 mg).  Again six hours before chemo, at ~ 3:30 AM on 01-07-13 you need to eat a little and take five more of the decadron tablets (=20 mg).  You may do better with ferrous fumarate or ferrous gluconate for iron, since these frequently are better tolerated than ferrous sulfate. These are also over the counter, but the pharmacist may need to find them for you.  ~ 325 mg twice a week should be enough for now.  You can call any time if questions or concerns    [phone]

## 2013-01-05 NOTE — Telephone Encounter (Signed)
Per staff message and POF I have scheduled appts.  JMW  

## 2013-01-05 NOTE — Telephone Encounter (Signed)
GAve pt appt for lab, MD and chemo on February 2014 then chemo on March 2014

## 2013-01-06 ENCOUNTER — Telehealth: Payer: Self-pay | Admitting: *Deleted

## 2013-01-06 ENCOUNTER — Other Ambulatory Visit: Payer: Self-pay

## 2013-01-06 DIAGNOSIS — C541 Malignant neoplasm of endometrium: Secondary | ICD-10-CM

## 2013-01-06 MED ORDER — DEXAMETHASONE 4 MG PO TABS: ORAL_TABLET | ORAL | Status: DC

## 2013-01-06 MED ORDER — ONDANSETRON HCL 8 MG PO TABS: 8.0000 mg | ORAL_TABLET | Freq: Three times a day (TID) | ORAL | Status: DC | PRN

## 2013-01-06 MED ORDER — PROCHLORPERAZINE MALEATE 10 MG PO TABS: 10.0000 mg | ORAL_TABLET | Freq: Four times a day (QID) | ORAL | Status: DC | PRN

## 2013-01-06 NOTE — Telephone Encounter (Signed)
Patient would like a call from provider's nurse.  Asked does she have to take the pre-meds tonight before starting chemotherapy tomorrow.  Reports the zofran and compazine are not on the $4.00 list and she can't afford these.  Will notify providers.  [place] may be reached at [phone].

## 2013-01-06 NOTE — Telephone Encounter (Signed)
Collaborative nurse has spoken with patient who now says she will pick up the $28.00 and the $30.00 anti-emetics.  This nurse called and reviewed with patient to make sure she gets the $4.00 dexamethasone and starts this tonight.  Deborah [name] says she will start the dexamethasone tonight.

## 2013-01-07 ENCOUNTER — Ambulatory Visit (HOSPITAL_BASED_OUTPATIENT_CLINIC_OR_DEPARTMENT_OTHER): Payer: Medicaid Other

## 2013-01-07 ENCOUNTER — Other Ambulatory Visit: Payer: Self-pay | Admitting: Oncology

## 2013-01-07 VITALS — BP 106/59 | HR 87 | Temp 98.6°F | Resp 20

## 2013-01-07 DIAGNOSIS — C541 Malignant neoplasm of endometrium: Secondary | ICD-10-CM

## 2013-01-07 DIAGNOSIS — Z5111 Encounter for antineoplastic chemotherapy: Secondary | ICD-10-CM

## 2013-01-07 DIAGNOSIS — C549 Malignant neoplasm of corpus uteri, unspecified: Secondary | ICD-10-CM

## 2013-01-07 MED ORDER — ONDANSETRON 16 MG/50ML IVPB (CHCC)
16.0000 mg | Freq: Once | INTRAVENOUS | Status: AC
  Administered 2013-01-07: 16 mg via INTRAVENOUS

## 2013-01-07 MED ORDER — ACETAMINOPHEN 325 MG PO TABS
650.0000 mg | ORAL_TABLET | Freq: Four times a day (QID) | ORAL | Status: DC | PRN
  Administered 2013-01-07: 650 mg via ORAL

## 2013-01-07 MED ORDER — SODIUM CHLORIDE 0.9 % IV SOLN
Freq: Once | INTRAVENOUS | Status: AC
  Administered 2013-01-07: 11:00:00 via INTRAVENOUS

## 2013-01-07 MED ORDER — DEXAMETHASONE SODIUM PHOSPHATE 4 MG/ML IJ SOLN
20.0000 mg | Freq: Once | INTRAMUSCULAR | Status: AC
  Administered 2013-01-07: 20 mg via INTRAVENOUS

## 2013-01-07 MED ORDER — PACLITAXEL CHEMO INJECTION 300 MG/50ML
175.0000 mg/m2 | Freq: Once | INTRAVENOUS | Status: AC
  Administered 2013-01-07: 432 mg via INTRAVENOUS
  Filled 2013-01-07: qty 72

## 2013-01-07 MED ORDER — SODIUM CHLORIDE 0.9 % IV SOLN
750.0000 mg | Freq: Once | INTRAVENOUS | Status: AC
  Administered 2013-01-07: 750 mg via INTRAVENOUS
  Filled 2013-01-07: qty 75

## 2013-01-07 MED ORDER — DIPHENHYDRAMINE HCL 50 MG/ML IJ SOLN
50.0000 mg | Freq: Once | INTRAMUSCULAR | Status: AC
  Administered 2013-01-07: 50 mg via INTRAVENOUS

## 2013-01-07 MED ORDER — FAMOTIDINE IN NACL 20-0.9 MG/50ML-% IV SOLN
20.0000 mg | Freq: Once | INTRAVENOUS | Status: AC
  Administered 2013-01-07: 20 mg via INTRAVENOUS

## 2013-01-07 NOTE — Patient Instructions (Signed)
[facility] Discharge Instructions for Patients Receiving Chemotherapy  Today you received the following chemotherapy agents :  Taxol,  Carboplatin.  To help prevent nausea and vomiting after your treatment, we encourage you to take your nausea medication as instructed by your physician.  DO NOT  Eat spicy or greasy foods ;   DO  DRINK  Lots of fluids,  And throughout your treatment  As tolerated. Take your antinausea medications as prescribed, and as needed to prevent nausea/vomting.  Follow Dr. Precious Reel  Instructions on how to take antinausea medications.   If you develop nausea and vomiting that is not controlled by your nausea medication, call the clinic. If it is after clinic hours your family physician or the after hours number for the clinic or go to the Emergency Department.   BELOW ARE SYMPTOMS THAT SHOULD BE REPORTED IMMEDIATELY:  *FEVER GREATER THAN 100.5 F  *CHILLS WITH OR WITHOUT FEVER  NAUSEA AND VOMITING THAT IS NOT CONTROLLED WITH YOUR NAUSEA MEDICATION  *UNUSUAL SHORTNESS OF BREATH  *UNUSUAL BRUISING OR BLEEDING  TENDERNESS IN MOUTH AND THROAT WITH OR WITHOUT PRESENCE OF ULCERS  *URINARY PROBLEMS  *BOWEL PROBLEMS  UNUSUAL RASH Items with * indicate a potential emergency and should be followed up as soon as possible.  One of the nurses will contact you 24 hours after your treatment. Please let the nurse know about any problems that you may have experienced. Feel free to call the clinic you have any questions or concerns. The clinic phone number is [phone].   I have been informed and understand all the instructions given to me. I know to contact the clinic, my physician, or go to the Emergency Department if any problems should occur. I do not have any questions at this time, but understand that I may call the clinic during office hours   should I have any questions or need assistance in obtaining follow up  care.    __________________________________________  _____________  __________ Signature of Patient or Authorized Representative            Date                   Time    __________________________________________ Nurse's Signature

## 2013-01-07 NOTE — Progress Notes (Signed)
1330 -  Pt stated headache almost gone. Pt tolerated first time Taxol without problems.  Taxol infused at slower rate per protocol.  VSS.  Pt was monitored very closely throughout Taxol infusion.  Taxol infused over  4hrs for first time as per protocol.

## 2013-01-08 ENCOUNTER — Telehealth: Payer: Self-pay | Admitting: *Deleted

## 2013-01-08 ENCOUNTER — Other Ambulatory Visit (HOSPITAL_COMMUNITY): Payer: Medicaid Other

## 2013-01-08 ENCOUNTER — Encounter (HOSPITAL_COMMUNITY): Payer: Medicaid Other

## 2013-01-08 NOTE — Telephone Encounter (Signed)
Patient called reporting her nose is flushed and red and her cheeks are red and swollen like she has been slapped.  Asked if this is a side effect of the prochlorperazine.  Informed her this is a side effect of the steroid dexamethasone and it will wear off in about three days.  Asked what the steroid is for and informed her it helps chemo work better and eliminate reaction, help with nausea also.  Reports he mouth has been watering but this stopped after taking anti-emetic.  No n/v.  Reports some neck pain but it went away on it's own.  Encouraged to drink lots of water and eat well.  Denies any other symptoms or side effects.  No further questions.

## 2013-01-08 NOTE — Telephone Encounter (Signed)
Message copied by Augusto Garbe on Thu Jan 08, 2013 11:31 AM ------      Message from: Normajean Baxter LE      Created: Wed Jan 07, 2013 12:52 PM      Regarding: Chemo f/u call       First time Taxol,  Carboplatin,  Dr. Cleophas Dunker,  TB, RN. ------

## 2013-01-09 ENCOUNTER — Telehealth: Payer: Self-pay | Admitting: *Deleted

## 2013-01-09 NOTE — Telephone Encounter (Signed)
Call sent to triage, patient calling in complaining of waking up with severe body aches and pains. Had Taxol/Carbo on 01/07/13. Feels like somebody has "jumped her". Patient denies any fever, chills, nausea or vomiting. Patient has not tried anything OTC yet. Informed patient she could try Tylenol every 6 hours. If no relief, patient was given Rx for Ultram that she states is still at the drug store. Informed her that this may be a repeat side effect for her, and to go ahead and get this filled to help with her pain. Patient verbalized understanding and instructed to call us back if Tylenol or Ultram did not give her relief so we can further assess any other causes of this pain.

## 2013-01-10 ENCOUNTER — Telehealth: Payer: Self-pay | Admitting: Oncology

## 2013-01-10 NOTE — Telephone Encounter (Signed)
Medical Oncology on call  Patient with taxol aches ongoing, worst in knees and some in back, tho arms are some better today. No significant nausea. Bowels ok.  Advil has helped aches a bit. She will get tramadol filled today and can try 1-2 every 8 hrs prn. She has tried hot shower.  Patient understands that aches are from taxol and hopefully will not last much longer. She appreciated call back.  Ila Mcgill, MD

## 2013-01-13 ENCOUNTER — Ambulatory Visit (HOSPITAL_BASED_OUTPATIENT_CLINIC_OR_DEPARTMENT_OTHER): Payer: Medicaid Other | Admitting: Oncology

## 2013-01-13 ENCOUNTER — Other Ambulatory Visit: Payer: Medicaid Other | Admitting: Lab

## 2013-01-13 ENCOUNTER — Ambulatory Visit: Payer: Medicaid Other | Admitting: Nutrition

## 2013-01-13 ENCOUNTER — Encounter: Payer: Self-pay | Admitting: Oncology

## 2013-01-13 VITALS — BP 120/83 | HR 101 | Temp 97.6°F | Resp 20 | Ht 66.0 in | Wt 279.7 lb

## 2013-01-13 DIAGNOSIS — C541 Malignant neoplasm of endometrium: Secondary | ICD-10-CM

## 2013-01-13 DIAGNOSIS — C549 Malignant neoplasm of corpus uteri, unspecified: Secondary | ICD-10-CM

## 2013-01-13 DIAGNOSIS — R0789 Other chest pain: Secondary | ICD-10-CM

## 2013-01-13 LAB — CBC WITH DIFFERENTIAL/PLATELET
BASO%: 0.7 % (ref 0.0–2.0)
Basophils Absolute: 0 10*3/uL (ref 0.0–0.1)
HCT: 41.7 % (ref 34.8–46.6)
HGB: 14 g/dL (ref 11.6–15.9)
MCH: 28.4 pg (ref 25.1–34.0)
MCHC: 33.6 g/dL (ref 31.5–36.0)
MCV: 84.6 fL (ref 79.5–101.0)
MONO%: 2.2 % (ref 0.0–14.0)
NEUT%: 49.5 % (ref 38.4–76.8)
RDW: 15.7 % — ABNORMAL HIGH (ref 11.2–14.5)

## 2013-01-13 MED ORDER — PEGFILGRASTIM INJECTION 6 MG/0.6ML
6.0000 mg | Freq: Once | SUBCUTANEOUS | Status: AC
  Administered 2013-01-13: 6 mg via SUBCUTANEOUS
  Filled 2013-01-13: qty 0.6

## 2013-01-13 NOTE — Progress Notes (Signed)
OFFICE PROGRESS NOTE   01/13/2013   Physicians: W.Brewster, J.Kinard, M.Billey [place] (PCP [facility]) J.Hochrein   INTERVAL HISTORY:   Patient is seen, alone for visit, in continuing attention to IIIC endometrial cancer, having had first cycle of full dose taxol/ carboplatin on 01-07-2013. Plan is for 6 cycles of chemotherapy in sandwich fashion with RT. She had severe taxol aches beginning acutely 0100 day after chemo and lasting until day 5, some better when she filled tramadol on day 4. She has not had gCSF thus far.  Patient had presented with spotting and menorrhagia, last PAP 2 years ago and 20 lb weight loss in past year. She had biopsies by Dr Nicholaus Bloom 09-22-2012, with pathology [phone], see below) showing endometroid adenocarcinoma in endometrial biopsy and in endocervical curettage. She went to robotic hysterectomy with BSO by Dr Nelly Rout on 10-07-12 (no lymph nodes sampled as serosal cysts appeared malignant). Pathology [phone], see below) showed grade 2 endometroid adenocarcinoma with involvement of < 1/2 of myometrium, no LVSI, did involve into lower uterine segment, ovaries and tubes benign and the serosal cysts benign. CT AP 11-25-12 showed shotty bilateral external iliac nodes up to 1.3 cm and nonspecific < 5 mm let para-aortic nodes. She saw Dr Nelly Rout following the CT, with recommendation for chemotherapy and radiation in sandwich fashion. Patient cancelled her new patient visit with me in Jan due to diarrhea, which resolved apparently without intervention. She had consultation visit with Dr Roselind Messier on 12-29-12, who recommends pelvic radiation and intracavitary brachytherapy; she will have reevaluation by Dr Roselind Messier after 3 cycles of chemotherapy.    Taxol aches have resolved. She has had very little nausea, used antiemetic x1 only. Bowels moved this AM. She is tired. She has had no fever or symptoms of infection and no bleeding. She denies shortness of breath, abdominal  pain, LE swelling. Peripheral IV access in LUE obtained without difficulty. She is having significant hot flashes. She has been drinking water and clear soda, and has tolerated small amounts of chicken and pineapple. Remainder of 10 point Review of Systems negative/ unchanged..  Objective:  Vital signs in last 24 hours:  BP 120/83  Pulse 101  Temp(Src) 97.6 F (36.4 C) (Oral)  Resp 20  Ht 5\' 6"  (1.676 m)  Wt 279 lb 11.2 oz (126.871 kg)  BMI 45.17 kg/m2  LMP 09/20/2012 This weight may not be correct. Awake, alert, ambulatory without assistance, respirations not labored RA. Not as anxious today.   HEENT:PERRLA, sclera clear, anicteric and oropharynx clear, no lesions. No alopecia LymphaticsCervical, supraclavicular, and axillary nodes normal. Resp: clear to auscultation bilaterally and normal percussion bilaterally Cardio: regular rate and rhythm GI: obese, soft, normal bowel sounds, not tender. Cannot appreciate HSM or mass. Surgical incision well healed Extremities: extremities normal, atraumatic, no cyanosis or edema  Congenital defect right hand Neuro:no sensory deficits noted Skin without rash or ecchymosis  Lab Results: CBC done today but did not cross over into EMR and full result not available to me at time of this transcription, however ANC was 1.6 with platelets and Hgb still good, not yet at nadir.   Studies/Results:  No results found.  Medications: I have reviewed the patient's current medications. Patient was given neulasta injection today as counts are expected to drop further in next week or so.    With transportation issues, we have decided that she will talk with RN on 2-21 or 01-19-13 rather than scheduling repeat CBC, tho if she is extremely fatigued or  other problems she still may need recheck of counts prior to my next visit on 01-23-13. We discussed avoiding anyone who might be ill.  Assessment/Plan:  11. IIIC endometrial carcinoma involving lower uterine  segment: post Robotic hysterectomy/ BSO 10-07-12, with benign serosal cysts but some adenopathy by subsequent CT. Recommendation for taxol/ carbo q 3 weeks with pelvic RT after cycle 3, then likely HDR with last 3 cycles of chemo. Cycle 1 complicated by taxol aches x 5 days and dropping ANC now. Neulasta given today, which may cause more bone pain. Cycle 2 due 01-28-13. 2.morbid obesity: dietician to see today re adequate nutrition for chemo and food choices etc. She does not seem to have much understanding of nutrition. 3.atypical chest pain: dobutamine echocardiogram.  rescheduled to 01-21-13 due to snow.  4.congenital birth defect right hand  5.degenerative arthritis  Patient knows that she can call prior to 01-16-13 if needed.    Clinton Wahlberg P, MD   01/13/2013, 10:14 AM

## 2013-01-13 NOTE — Patient Instructions (Signed)
Call Dr Precious Reel RN to let us know how you are on Friday 2-21 and/or Mon 2-24.   [phone]

## 2013-01-13 NOTE — Progress Notes (Signed)
This is a 47 year old female patient diagnosed with endometrial cancer. She is a patient of Dr. Darrold Span.  Past medical history includes anemia, anxiety, swelling, depression, hyperlipidemia, obesity, and congenital birth defects.  Medications include align, Decadron, ferrous sulfate, Lasix, lactate, Zofran, Compazine, and Zoloft.  Labs include albumin 3.1 on February 10.  Height: 66 inches. Weight: 279.7 pounds on February 18. Usual body weight: 295 pounds November 2013. BMI: 45.17.  Patient is status post chemotherapy last Wednesday. She states she will have chemotherapy every 3 weeks. She has a poor appetite. She complains of body aches and fatigue. She denies other nutrition side effects. She does verbalizes concern over weight loss.  Nutrition diagnosis: Unintended weight loss related to endometrial cancer and associated treatments as evidenced by 5% weight loss in 3 months.  Intervention: I educated patient on strategies for increasing oral intake while experiencing nausea and vomiting. We've discussed the importance of smaller, more frequent meals with protein at each meal and snack. I've educated her on specific protein foods. I've also discussed strategies for increasing appetite. I provided her fact sheets on poor appetite, nausea/vomiting, and fatigue. I've also given her oral nutrition supplement samples and coupons for her to try. I've answered her questions.  Monitoring, evaluation, goals: Patient will tolerate increased oral intake to minimize rapid weight loss.  Next visit: Wednesday, March 5, during chemotherapy.

## 2013-01-14 ENCOUNTER — Encounter: Payer: Self-pay | Admitting: Family Medicine

## 2013-01-16 ENCOUNTER — Other Ambulatory Visit: Payer: Self-pay | Admitting: *Deleted

## 2013-01-16 MED ORDER — FUROSEMIDE 20 MG PO TABS: 20.0000 mg | ORAL_TABLET | Freq: Every morning | ORAL | Status: DC

## 2013-01-19 ENCOUNTER — Encounter (HOSPITAL_COMMUNITY): Payer: Self-pay | Admitting: Emergency Medicine

## 2013-01-19 ENCOUNTER — Emergency Department (HOSPITAL_COMMUNITY)
Admission: EM | Admit: 2013-01-19 | Discharge: 2013-01-19 | Disposition: A | Discharge status: Home / Self Care | Payer: Medicaid Other | Attending: Emergency Medicine | Admitting: Emergency Medicine

## 2013-01-19 ENCOUNTER — Telehealth: Payer: Self-pay

## 2013-01-19 DIAGNOSIS — Z79899 Other long term (current) drug therapy: Secondary | ICD-10-CM | POA: Insufficient documentation

## 2013-01-19 DIAGNOSIS — Z87768 Personal history of other specified (corrected) congenital malformations of integument, limbs and musculoskeletal system: Secondary | ICD-10-CM | POA: Insufficient documentation

## 2013-01-19 DIAGNOSIS — F3289 Other specified depressive episodes: Secondary | ICD-10-CM | POA: Insufficient documentation

## 2013-01-19 DIAGNOSIS — Z90710 Acquired absence of both cervix and uterus: Secondary | ICD-10-CM | POA: Insufficient documentation

## 2013-01-19 DIAGNOSIS — D649 Anemia, unspecified: Secondary | ICD-10-CM | POA: Insufficient documentation

## 2013-01-19 DIAGNOSIS — Z8544 Personal history of malignant neoplasm of other female genital organs: Secondary | ICD-10-CM | POA: Insufficient documentation

## 2013-01-19 DIAGNOSIS — Z7982 Long term (current) use of aspirin: Secondary | ICD-10-CM | POA: Insufficient documentation

## 2013-01-19 DIAGNOSIS — E669 Obesity, unspecified: Secondary | ICD-10-CM | POA: Insufficient documentation

## 2013-01-19 DIAGNOSIS — Z8739 Personal history of other diseases of the musculoskeletal system and connective tissue: Secondary | ICD-10-CM | POA: Insufficient documentation

## 2013-01-19 DIAGNOSIS — F411 Generalized anxiety disorder: Secondary | ICD-10-CM | POA: Insufficient documentation

## 2013-01-19 DIAGNOSIS — R1013 Epigastric pain: Secondary | ICD-10-CM | POA: Insufficient documentation

## 2013-01-19 DIAGNOSIS — Z862 Personal history of diseases of the blood and blood-forming organs and certain disorders involving the immune mechanism: Secondary | ICD-10-CM | POA: Insufficient documentation

## 2013-01-19 DIAGNOSIS — Z8639 Personal history of other endocrine, nutritional and metabolic disease: Secondary | ICD-10-CM | POA: Insufficient documentation

## 2013-01-19 MED ORDER — GI COCKTAIL ~~LOC~~
30.0000 mL | Freq: Once | ORAL | Status: AC
  Administered 2013-01-19: 30 mL via ORAL
  Filled 2013-01-19: qty 30

## 2013-01-19 NOTE — ED Notes (Signed)
MD at bedside. 

## 2013-01-19 NOTE — ED Provider Notes (Signed)
History     CSN: [id number]  Arrival date & time 01/19/13  4098   First MD Initiated Contact with Patient 01/19/13 0531      Chief Complaint  Patient presents with  . Abdominal Pain    (Consider location/radiation/quality/duration/timing/severity/associated sxs/prior treatment) HPI This is a 47 year old female with a recent hysterectomy for in vitro cancer, now on chemotherapy. She is here with epigastric pain that woke her up about 4 AM. She describes it as being sharp and crampy, moderate to severe in intensity. There was no radiation. She had no associated nausea, vomiting or diarrhea. In fact she was constipated yesterday but did have a small bowel movement earlier. She took a laxative for her constipation. Her symptoms have subsequently resolved and she has no abdominal pain at the present time. She is known gallstones but no history of biliary colic. She denies right upper quadrant pain.  Past Medical History  Diagnosis Date  . Congenital birth defect     Right hand  . Anemia   . Arthritis     knees  . Endometrial cancer   . Anxiety   . Swelling     ANKLES - TAKES LASIX  . Depression   . HLD (hyperlipidemia)     borderline  . Obesity     Past Surgical History  Procedure Laterality Date  . Hiatal hernia repair    . Repair vaginal cuff  10/07/2012    Procedure: REPAIR VAGINAL CUFF;  Surgeon: Laurette Schimke, MD PHD;  Location: [facility];  Service: Gynecology;;  . Robotic assisted total hysterectomy with bilateral salpingo oopherectomy  10/07/2012    Procedure: ROBOTIC ASSISTED TOTAL HYSTERECTOMY WITH BILATERAL SALPINGO OOPHORECTOMY;  Surgeon: Laurette Schimke, MD PHD;  Location: [facility];  Service: Gynecology;  Laterality: Bilateral;  . Abdominal hysterectomy  10/2011    complete    Family History  Problem Relation Age of Onset  . Pancreatic cancer Maternal Grandfather     or liver cancer  . Hypertension Maternal Grandfather   . Cancer Maternal Grandfather      pancreatic  . Hypertension Maternal Grandmother   . Diabetes Mother   . Colon polyps Maternal Aunt   . Cancer Maternal Aunt     lung  . Colon cancer Cousin   . Cancer Cousin     lung  . Lung cancer Maternal Aunt     History  Substance Use Topics  . Smoking status: Never Smoker   . Smokeless tobacco: Never Used  . Alcohol Use: Yes     Comment: rare    OB History   Grav Para Term Preterm Abortions TAB SAB Ect Mult Living   2 1 0 1 1 0 1 0 0 1       Review of Systems  All other systems reviewed and are negative.    Allergies  Review of patient's allergies indicates no known allergies.  Home Medications   Current Outpatient Rx  Name  Route  Sig  Dispense  Refill  . aspirin 81 MG tablet   Oral   Take 81 mg by mouth every morning.          . bifidobacterium infantis (ALIGN) capsule   Oral   Take 1 capsule by mouth daily as needed.         [name] dexamethasone (DECADRON) 4 MG tablet      Take 5 tablets with food 12 hrs. and 6 hrs. Prior to Taxol chemotherapy.   10 tablet   0   .  ferrous sulfate 325 (65 FE) MG tablet   Oral   Take 325 mg by mouth every evening.         . furosemide (LASIX) 20 MG tablet   Oral   Take 1 tablet (20 mg total) by mouth every morning.   30 tablet   3   . lactase (LACTAID) 3000 UNITS tablet   Oral   Take 1 tablet by mouth 3 (three) times daily with meals as needed.         . ondansetron (ZOFRAN) 8 MG tablet   Oral   Take 1-2 tablets (8-16 mg total) by mouth every 8 (eight) hours as needed for nausea (Will not make you drowsy).   30 tablet   1   . prochlorperazine (COMPAZINE) 10 MG tablet   Oral   Take 1 tablet (10 mg total) by mouth every 6 (six) hours as needed.   20 tablet   0   . sertraline (ZOLOFT) 100 MG tablet   Oral   Take 100 mg by mouth every evening.         . traMADol (ULTRAM) 50 MG tablet   Oral   Take 1 tablet (50 mg total) by mouth every 8 (eight) hours as needed for pain.   30 tablet   2      BP 120/82  Pulse 96  Temp(Src) 98 F (36.7 C)  Resp 16  SpO2 95%  LMP 09/20/2012  Physical Exam General: Well-developed, well-nourished female in no acute distress; appearance consistent with age of record HENT: normocephalic, atraumatic Eyes: pupils equal round and reactive to light; extraocular muscles intact Neck: supple Heart: regular rate and rhythm Lungs: clear to auscultation bilaterally Abdomen: soft; nondistended; nontender; no masses or hepatosplenomegaly; bowel sounds present Extremities: Congenital deformity of right hand with presence of only the first and fifth digits; full range of motion Neurologic: Awake, alert and oriented; motor function intact in all extremities and symmetric; no facial droop Skin: Warm and dry Psychiatric: Flat affect    ED Course  Procedures (including critical care time)     MDM  6:06 AM Patient advised that symptoms may have represented a brief episode of biliary colic. She was advised to return should symptoms worsen, especially for pain in the right upper quadrant. She was advised of the gallstones seen incidentally on her prior CT scan.        Hanley Seamen, MD 01/19/13 [phone]

## 2013-01-19 NOTE — ED Notes (Signed)
Pt alert, arrives from home, c/o mid epigastric pain, onset was this evening, states recent bowel constipation w/o relief, describes pain as sharp, intermittent, non radaiting, resp even unlabored, skin pwd

## 2013-01-19 NOTE — Telephone Encounter (Signed)
Spoke with Deborah [name].  She is doing fine in regard to aches.  Afebrile. 98.0 today in ED.  She had some severe abdominal pain this am.  Micah Flesher to ED.  Possibly some brief  biliary colic from gall stones. Patient feeling much better now.  Will keep appt. as scheduled with Dr. Darrold Span on 01-23-13.

## 2013-01-20 ENCOUNTER — Ambulatory Visit (HOSPITAL_COMMUNITY): Payer: Medicaid Other | Attending: Cardiology | Admitting: Radiology

## 2013-01-20 ENCOUNTER — Ambulatory Visit (HOSPITAL_COMMUNITY): Payer: Medicaid Other | Attending: Cardiology

## 2013-01-20 ENCOUNTER — Encounter: Payer: Self-pay | Admitting: Cardiology

## 2013-01-20 DIAGNOSIS — R072 Precordial pain: Secondary | ICD-10-CM

## 2013-01-20 DIAGNOSIS — R079 Chest pain, unspecified: Secondary | ICD-10-CM | POA: Insufficient documentation

## 2013-01-20 DIAGNOSIS — Z6841 Body Mass Index (BMI) 40.0 and over, adult: Secondary | ICD-10-CM | POA: Insufficient documentation

## 2013-01-20 DIAGNOSIS — C549 Malignant neoplasm of corpus uteri, unspecified: Secondary | ICD-10-CM | POA: Insufficient documentation

## 2013-01-20 DIAGNOSIS — E669 Obesity, unspecified: Secondary | ICD-10-CM | POA: Insufficient documentation

## 2013-01-20 DIAGNOSIS — E785 Hyperlipidemia, unspecified: Secondary | ICD-10-CM | POA: Insufficient documentation

## 2013-01-20 NOTE — Progress Notes (Signed)
Dobutamine Echocardiogram performed.  

## 2013-01-21 ENCOUNTER — Other Ambulatory Visit (HOSPITAL_COMMUNITY): Payer: Medicaid Other

## 2013-01-21 ENCOUNTER — Telehealth: Payer: Self-pay | Admitting: Family Medicine

## 2013-01-21 ENCOUNTER — Encounter (HOSPITAL_COMMUNITY): Payer: Medicaid Other

## 2013-01-21 ENCOUNTER — Telehealth: Payer: Self-pay | Admitting: Oncology

## 2013-01-21 NOTE — Telephone Encounter (Signed)
Patient would like to speak to the nurse about what she can do to lower her cholesterol.  She received the letter from 2/19 that stated her LDL is a little high, she already exercises and doesn't eat fried foods so she would like to know what else she can do.

## 2013-01-21 NOTE — Telephone Encounter (Signed)
, °

## 2013-01-21 NOTE — Telephone Encounter (Signed)
Spoke with patient and discussed weight loss , dietary changes  And exercise to help  lower LDL cholesterol.

## 2013-01-22 ENCOUNTER — Telehealth: Payer: Self-pay | Admitting: Oncology

## 2013-01-22 ENCOUNTER — Ambulatory Visit
Admission: RE | Admit: 2013-01-22 | Discharge: 2013-01-22 | Disposition: A | Discharge status: Home / Self Care | Payer: Medicaid Other | Source: Ambulatory Visit | Attending: Family Medicine | Admitting: Family Medicine

## 2013-01-23 ENCOUNTER — Telehealth: Payer: Self-pay | Admitting: *Deleted

## 2013-01-23 ENCOUNTER — Telehealth: Payer: Self-pay | Admitting: Oncology

## 2013-01-23 ENCOUNTER — Other Ambulatory Visit: Payer: Self-pay

## 2013-01-23 ENCOUNTER — Other Ambulatory Visit (HOSPITAL_BASED_OUTPATIENT_CLINIC_OR_DEPARTMENT_OTHER): Payer: Medicaid Other | Admitting: Lab

## 2013-01-23 ENCOUNTER — Ambulatory Visit (HOSPITAL_BASED_OUTPATIENT_CLINIC_OR_DEPARTMENT_OTHER): Payer: Medicaid Other | Admitting: Oncology

## 2013-01-23 ENCOUNTER — Encounter: Payer: Self-pay | Admitting: Oncology

## 2013-01-23 VITALS — BP 115/84 | HR 101 | Temp 97.7°F | Resp 18 | Ht 66.0 in | Wt 286.5 lb

## 2013-01-23 DIAGNOSIS — M1990 Unspecified osteoarthritis, unspecified site: Secondary | ICD-10-CM

## 2013-01-23 DIAGNOSIS — K5900 Constipation, unspecified: Secondary | ICD-10-CM

## 2013-01-23 LAB — COMPREHENSIVE METABOLIC PANEL (CC13)
Albumin: 2.9 g/dL — ABNORMAL LOW (ref 3.5–5.0)
Alkaline Phosphatase: 86 U/L (ref 40–150)
BUN: 17.2 mg/dL (ref 7.0–26.0)
CO2: 27 mEq/L (ref 22–29)
Glucose: 97 mg/dl (ref 70–99)
Sodium: 143 mEq/L (ref 136–145)
Total Bilirubin: 0.2 mg/dL (ref 0.20–1.20)
Total Protein: 7.2 g/dL (ref 6.4–8.3)

## 2013-01-23 LAB — CBC WITH DIFFERENTIAL/PLATELET
Basophils Absolute: 0 10*3/uL (ref 0.0–0.1)
Eosinophils Absolute: 0.1 10*3/uL (ref 0.0–0.5)
HCT: 39.6 % (ref 34.8–46.6)
HGB: 12.8 g/dL (ref 11.6–15.9)
LYMPH%: 29.3 % (ref 14.0–49.7)
MCV: 85.1 fL (ref 79.5–101.0)
MONO#: 0.5 10*3/uL (ref 0.1–0.9)
MONO%: 6.5 % (ref 0.0–14.0)
NEUT#: 4.8 10*3/uL (ref 1.5–6.5)
Platelets: 110 10*3/uL — ABNORMAL LOW (ref 145–400)
WBC: 7.7 10*3/uL (ref 3.9–10.3)

## 2013-01-23 MED ORDER — DEXAMETHASONE 4 MG PO TABS: ORAL_TABLET | ORAL | Status: DC

## 2013-01-23 MED ORDER — POLYETHYLENE GLYCOL 3350 17 GM/SCOOP PO POWD: 17.0000 g | Freq: Two times a day (BID) | ORAL | Status: DC | PRN

## 2013-01-23 NOTE — Patient Instructions (Addendum)
Your chemo will be on March 5 (Wed) at 1230.  You will take the steroid tablets (decadron, dexamethasone) five tablets with food (=20 mg) at midnight-thirty the night before chemo and again five tablets with food at 6:30AM before chemo that day.  Start the tramadol the night of your chemo and take it every 8 hours for the next few days until taxol aches are better.  Keep your bowels moving very well every day. You can use 2 of the bisacodyl tablets daily, and you can use miralax (glycolax) powder 1 capful in a glass of fluid once or twice daily. Keeping bowels moving really well should help the abdominal pressure and pain.

## 2013-01-23 NOTE — Telephone Encounter (Signed)
Called pt and let her know it is OK to take OTC meds for allery/sinus per Dr Darrold Span. Meds added to patient's med list

## 2013-01-23 NOTE — Telephone Encounter (Signed)
gv and printed appt schedule for pt for March and April...emailed michelle to add tx

## 2013-01-23 NOTE — Telephone Encounter (Signed)
Per staff message and POF I have scheduled appts.  JMW  

## 2013-01-23 NOTE — Progress Notes (Signed)
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 We anticipate RT after 3 cycles, then completion of total 6 cycles of the chemotherapy. She had a difficult time with taxol aches and nausea/ constipation after cycle 1 on 01-07-13; she had neulasta on 01-13-13. She was seen in ED 01-19-13 for upper abdominal pressure and generalized abdominal discomfort, likely from constipation.  Patient presented with spotting and menorrhagia, last PAP 2 years prior. Biopsies by Dr Nicholaus Bloom 09-22-2012 (path [id number]) showed endometroid adenocarcinoma in endometrial biopsy and in endocervical curettage. She went to robotic hysterectomy with BSO by Dr Nelly Rout on 10-07-12 (no lymph nodes sampled as serosal cysts appeared malignant). Pathology [phone]) showed grade 2 endometroid adenocarcinoma with involvement of < 1/2 of myometrium, no LVSI, did involve into lower uterine segment, ovaries and tubes benign and the serosal cysts benign. CT AP 11-25-12 showed shotty bilateral external iliac nodes up to 1.3 cm and nonspecific < 5 mm left para-aortic nodes. She saw Dr Nelly Rout following the CT, with recommendation for chemotherapy and radiation in sandwich fashion. Patient cancelled her new patient visit with me in Jan, that rescheduled. She had consultation visit with Dr Roselind Messier on 12-29-12, who recommends pelvic radiation and intracavitary brachytherapy; she will have reevaluation by Dr Roselind Messier after 3 cycles of chemotherapy. Taxol/carboplatin was started 01-07-13, planned every 21 days x 6 cycles around RT.   Patient had dobutamine echocardiogram stress test on 01-20-13. She has not heard yet from cardiology about results, however report is of global hypokinesis with EF 35-40% without ischemic  changes.  Patient is feeling much better overall this week. She had eaten a large meal prior to the abdominal symptoms for which she was seen in ED. She has had small bowel movements ~ daily using generic biscodyl, she can use 1-2 daily; we have discussed using this and/or miralax to keep bowels moving very well daily.She has no peripheral neuropathy symptoms, no increased SOB, no chest pain, no LE swelling, no bleeding, no fever or symptoms of infection. She is losing hair and has been given voucher for wig. Energy is better this week. Remainder of 10 point Review of Systems negative.  [facility] dietician is involved and plans to see her again day of chemo. Patient is eating [place] sub at office today.  Objective:  Vital signs in last 24 hours:  BP 115/84  Pulse 101  Temp(Src) 97.7 F (36.5 C) (Oral)  Resp 18  Ht 5\' 6"  (1.676 m)  Wt 286 lb 8 oz (129.956 kg)  BMI 46.26 kg/m2  LMP 09/20/2012 Weight is up 7 lbs. Alert, talkative, looks more comfortable, ambulatory without assistance.   HEENT:PERRLA, sclera clear, anicteric and oropharynx clear, no lesions  Partial alopecia. Mucous membranes moist LymphaticsCervical, supraclavicular, and axillary nodes normal. Resp: clear to auscultation bilaterally and normal percussion bilaterally Cardio: regular rate and rhythm  No gallop GI: obese, soft, not tender including epigastrium, some bowel sounds, nothing palpable, Extremities: extremities normal, atraumatic, no cyanosis or edema. Congenital defect right hand Neuro:no sensory deficits noted Breasts large, no clear dominant masses, no skin or nipple findings. No central catheter Skin without rash or ecchymosis  Lab Results:  Results for orders placed in visit on 01/23/13  CBC WITH DIFFERENTIAL      Result Value Range   WBC 7.7  3.9 -  10.3 10e3/uL   NEUT# 4.8  1.5 - 6.5 10e3/uL   HGB 12.8  11.6 - 15.9 g/dL   HCT 38.7  56.4 - 33.2 %   Platelets 110 (*) 145 - 400 10e3/uL   MCV 85.1  79.5  - 101.0 fL   MCH 27.5  25.1 - 34.0 pg   MCHC 32.4  31.5 - 36.0 g/dL   RBC 9.51  8.84 - 1.66 10e6/uL   RDW 17.2 (*) 11.2 - 14.5 %   lymph# 2.2  0.9 - 3.3 10e3/uL   MONO# 0.5  0.1 - 0.9 10e3/uL   Eosinophils Absolute 0.1  0.0 - 0.5 10e3/uL   Basophils Absolute 0.0  0.0 - 0.1 10e3/uL   NEUT% 62.9  38.4 - 76.8 %   LYMPH% 29.3  14.0 - 49.7 %   MONO% 6.5  0.0 - 14.0 %   EOS% 0.8  0.0 - 7.0 %   BASO% 0.5  0.0 - 2.0 %  COMPREHENSIVE METABOLIC PANEL (CC13)      Result Value Range   Sodium 143  136 - 145 mEq/L   Potassium 3.3 (*) 3.5 - 5.1 mEq/L   Chloride 104  98 - 107 mEq/L   CO2 27  22 - 29 mEq/L   Glucose 97  70 - 99 mg/dl   BUN 06.3  7.0 - 01.6 mg/dL   Creatinine 0.8  0.6 - 1.1 mg/dL   Total Bilirubin 0.10  0.20 - 1.20 mg/dL   Alkaline Phosphatase 86  40 - 150 U/L   AST 15  5 - 34 U/L   ALT 25  0 - 55 U/L   Total Protein 7.2  6.4 - 8.3 g/dL   Albumin 2.9 (*) 3.5 - 5.0 g/dL   Calcium 8.7  8.4 - 93.2 mg/dL     Studies/Results:  Report of mammograms done at [facility] 01-23-13 was pending at time of visit, available subsequently with possible bilateral breast masses and further imaging requested DIGITAL BILATERAL SCREENING MAMMOGRAM WITH CAD 01-23-23 Comparison: None.  FINDINGS:  Breast Density: 2: There is a scattered fibroglandular pattern.  In the left breast, possible mass warrants further imaging  evaluation with spot compression views and possible ultrasound. In  the right breast, a possible mass warrants further imaging  evaluation with spot compression views of possible ultrasound.  Images were processed with CAD.  IMPRESSION:  Further imaging evaluation is suggested for possible mass in the  left breast.  Further imaging evaluation is suggested for possible mass in the  right breast.  RECOMMENDATION:  Diagnostic mammogrampossible ultrasoundof both breasts. (Code:[id number]  48M)  The patient will be contacted regarding the findings, and  additional imaging will be  scheduled.  BI-RADS CATEGORY 0: Incomplete. Need additional imaging  evaluation and/or prior mammograms for comparison.  Medications: I have reviewed the patient's current medications. Decadron and miralax prescriptions sent to pharmacy, which will be covered on Medicaid. Written and oral instructions for these given. She will begin tramadol night of chemo and continue 50 mg q 8 hours for next few days until taxol aches subside.  Assessment/Plan: 1.IIIC endometrial carcinoma as above: post hysterectomy/BSO 10-07-12 with serosal cysts benign but adenopathy by subsequent CT. Continuing taxol/carboplatin with cycle 2 to be given 01-28-13. I will see her back with neulasta 02-04-13. 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 Cardiology is to be in touch with her about results. Present chemotherapy not cardiotoxic 4.constipation  likely causing abdominal discomfort this week: plan as above 5.degenerative arthritis 6.report of first screening mammograms available after visit, needs follow up imaging, which [facility] will coordinate.  Patient followed discussion well and was in agreement with plan.   Reece Packer, MD   01/23/2013, 12:37 PM

## 2013-01-24 ENCOUNTER — Other Ambulatory Visit: Payer: Self-pay | Admitting: Oncology

## 2013-01-26 ENCOUNTER — Other Ambulatory Visit: Payer: Self-pay | Admitting: Family Medicine

## 2013-01-27 ENCOUNTER — Telehealth: Payer: Self-pay | Admitting: Cardiology

## 2013-01-27 NOTE — Telephone Encounter (Signed)
Pt aware of results  Aware I will call back once Dr Antoine Poche reviews

## 2013-01-27 NOTE — Telephone Encounter (Signed)
New Problem:    Patient called in wanting to know the results of her latest Stress Echo.  Please call back.

## 2013-01-28 ENCOUNTER — Ambulatory Visit (HOSPITAL_BASED_OUTPATIENT_CLINIC_OR_DEPARTMENT_OTHER): Payer: Medicaid Other

## 2013-01-28 ENCOUNTER — Ambulatory Visit: Payer: Medicaid Other | Admitting: Nutrition

## 2013-01-28 ENCOUNTER — Other Ambulatory Visit (HOSPITAL_BASED_OUTPATIENT_CLINIC_OR_DEPARTMENT_OTHER): Payer: Medicaid Other

## 2013-01-28 VITALS — BP 110/79 | HR 99 | Temp 97.0°F | Resp 18

## 2013-01-28 LAB — CBC WITH DIFFERENTIAL/PLATELET
Basophils Absolute: 0 10*3/uL (ref 0.0–0.1)
Eosinophils Absolute: 0 10*3/uL (ref 0.0–0.5)
HGB: 13.6 g/dL (ref 11.6–15.9)
MCV: 83.6 fL (ref 79.5–101.0)
MONO#: 0 10*3/uL — ABNORMAL LOW (ref 0.1–0.9)
NEUT#: 4.6 10*3/uL (ref 1.5–6.5)
Platelets: 195 10*3/uL (ref 145–400)
RBC: 4.88 10*6/uL (ref 3.70–5.45)
RDW: 15.6 % — ABNORMAL HIGH (ref 11.2–14.5)
WBC: 5.4 10*3/uL (ref 3.9–10.3)

## 2013-01-28 MED ORDER — SODIUM CHLORIDE 0.9 % IV SOLN
Freq: Once | INTRAVENOUS | Status: AC
  Administered 2013-01-28: 13:00:00 via INTRAVENOUS

## 2013-01-28 MED ORDER — FAMOTIDINE IN NACL 20-0.9 MG/50ML-% IV SOLN
20.0000 mg | Freq: Once | INTRAVENOUS | Status: AC
  Administered 2013-01-28: 20 mg via INTRAVENOUS

## 2013-01-28 MED ORDER — DEXTROSE 5 % IV SOLN
175.0000 mg/m2 | Freq: Once | INTRAVENOUS | Status: AC
  Administered 2013-01-28: 432 mg via INTRAVENOUS
  Filled 2013-01-28: qty 72

## 2013-01-28 MED ORDER — DEXAMETHASONE SODIUM PHOSPHATE 4 MG/ML IJ SOLN
20.0000 mg | Freq: Once | INTRAMUSCULAR | Status: AC
Start: 2013-01-28 — End: 2013-01-28
  Administered 2013-01-28: 20 mg via INTRAVENOUS

## 2013-01-28 MED ORDER — SODIUM CHLORIDE 0.9 % IV SOLN
750.0000 mg | Freq: Once | INTRAVENOUS | Status: AC
  Administered 2013-01-28: 750 mg via INTRAVENOUS
  Filled 2013-01-28: qty 75

## 2013-01-28 MED ORDER — DIPHENHYDRAMINE HCL 50 MG/ML IJ SOLN
50.0000 mg | Freq: Once | INTRAMUSCULAR | Status: AC
  Administered 2013-01-28: 50 mg via INTRAVENOUS

## 2013-01-28 MED ORDER — ONDANSETRON 16 MG/50ML IVPB (CHCC)
16.0000 mg | Freq: Once | INTRAVENOUS | Status: AC
  Administered 2013-01-28: 16 mg via INTRAVENOUS

## 2013-01-28 NOTE — Patient Instructions (Addendum)
[facility] Discharge Instructions for Patients Receiving Chemotherapy  Today you received the following chemotherapy agents TAXOL/ CARBOPLATIN  To help prevent nausea and vomiting after your treatment, we encourage you to take your nausea medication  and take it as often as prescribed.   If you develop nausea and vomiting that is not controlled by your nausea medication, call the clinic. If it is after clinic hours your family physician or the after hours number for the clinic or go to the Emergency Department.   BELOW ARE SYMPTOMS THAT SHOULD BE REPORTED IMMEDIATELY:  *FEVER GREATER THAN 100.5 F  *CHILLS WITH OR WITHOUT FEVER  NAUSEA AND VOMITING THAT IS NOT CONTROLLED WITH YOUR NAUSEA MEDICATION  *UNUSUAL SHORTNESS OF BREATH  *UNUSUAL BRUISING OR BLEEDING  TENDERNESS IN MOUTH AND THROAT WITH OR WITHOUT PRESENCE OF ULCERS  *URINARY PROBLEMS  *BOWEL PROBLEMS  UNUSUAL RASH Items with * indicate a potential emergency and should be followed up as soon as possible.  One of the nurses will contact you 24 hours after your treatment. Please let the nurse know about any problems that you may have experienced. Feel free to call the clinic you have any questions or concerns. The clinic phone number is [phone].   I have been informed and understand all the instructions given to me. I know to contact the clinic, my physician, or go to the Emergency Department if any problems should occur. I do not have any questions at this time, but understand that I may call the clinic during office hours   should I have any questions or need assistance in obtaining follow up care.    __________________________________________  _____________  __________ Signature of Patient or Authorized Representative            Date                   Time    __________________________________________ Nurse's Signature

## 2013-01-28 NOTE — Progress Notes (Signed)
I spoke with patient in chemotherapy. She reports her appetite has improved. She is following recommendations for small frequent meals. She enjoys drinking Ensure Plus. She denies nausea and vomiting. Her weight has increased to 286.5 pounds on February 28 from 279.7 pounds February 18.  Nutrition diagnosis: Unintended weight loss improved.  Intervention: I enforced the importance of patient continuing small frequent meals with bland foods directly after chemotherapy. She is to continue to consume high-protein meals and snacks. She can drink Ensure Plus if she is unable to eat. I have provided additional samples of boost protein bars and one complementary case of Ensure Plus for patient to take with her today. Patient extremely appreciative.  Monitoring, evaluation, goals: Patient will tolerate adequate calories and protein to promote weight maintenance.  Next visit: Wednesday, March 26, during chemotherapy.

## 2013-02-01 ENCOUNTER — Other Ambulatory Visit: Payer: Self-pay | Admitting: Oncology

## 2013-02-02 ENCOUNTER — Ambulatory Visit: Payer: Medicaid Other | Admitting: Oncology

## 2013-02-02 ENCOUNTER — Other Ambulatory Visit: Payer: Medicaid Other | Admitting: Lab

## 2013-02-03 ENCOUNTER — Telehealth: Payer: Self-pay | Admitting: Internal Medicine

## 2013-02-03 ENCOUNTER — Telehealth: Payer: Self-pay | Admitting: Cardiology

## 2013-02-03 NOTE — Telephone Encounter (Signed)
Pt calling re test results from last week , pls call

## 2013-02-03 NOTE — Telephone Encounter (Signed)
Reviewed results of testing again with pt.  She will call back to schedule an appointment

## 2013-02-03 NOTE — Telephone Encounter (Signed)
Ms Mcmasters called 02/03/13 c/o severe pain and cramping in the muscles of her legs progressive for the past couple days, now 10/10, not relieved by tramadol or OTC medications.  As her last potassium was 3.3 and she is on lasix,  I am concerned for hypokalemia. I advised her to come in to the ER for an electrolyte check & pain control. Zeb Comfort, MD

## 2013-02-04 ENCOUNTER — Ambulatory Visit (HOSPITAL_BASED_OUTPATIENT_CLINIC_OR_DEPARTMENT_OTHER): Payer: Medicaid Other | Admitting: Oncology

## 2013-02-04 ENCOUNTER — Encounter: Payer: Self-pay | Admitting: Oncology

## 2013-02-04 ENCOUNTER — Ambulatory Visit (HOSPITAL_BASED_OUTPATIENT_CLINIC_OR_DEPARTMENT_OTHER): Payer: Medicaid Other

## 2013-02-04 ENCOUNTER — Other Ambulatory Visit (HOSPITAL_BASED_OUTPATIENT_CLINIC_OR_DEPARTMENT_OTHER): Payer: Medicaid Other | Admitting: Lab

## 2013-02-04 VITALS — BP 109/74 | HR 112 | Temp 97.7°F | Resp 20 | Ht 66.0 in | Wt 279.9 lb

## 2013-02-04 VITALS — BP 127/81 | HR 109 | Temp 98.5°F

## 2013-02-04 LAB — COMPREHENSIVE METABOLIC PANEL (CC13)
BUN: 16.7 mg/dL (ref 7.0–26.0)
CO2: 26 mEq/L (ref 22–29)
Creatinine: 0.8 mg/dL (ref 0.6–1.1)
Glucose: 107 mg/dl — ABNORMAL HIGH (ref 70–99)
Total Bilirubin: 0.66 mg/dL (ref 0.20–1.20)
Total Protein: 7.9 g/dL (ref 6.4–8.3)

## 2013-02-04 LAB — CBC WITH DIFFERENTIAL/PLATELET
Basophils Absolute: 0 10*3/uL (ref 0.0–0.1)
Eosinophils Absolute: 0.1 10*3/uL (ref 0.0–0.5)
HCT: 36.7 % (ref 34.8–46.6)
LYMPH%: 62.3 % — ABNORMAL HIGH (ref 14.0–49.7)
MCV: 83.4 fL (ref 79.5–101.0)
MONO#: 0.1 10*3/uL (ref 0.1–0.9)
MONO%: 4.4 % (ref 0.0–14.0)
NEUT#: 0.6 10*3/uL — ABNORMAL LOW (ref 1.5–6.5)
NEUT%: 29.8 % — ABNORMAL LOW (ref 38.4–76.8)
Platelets: 169 10*3/uL (ref 145–400)
WBC: 2 10*3/uL — ABNORMAL LOW (ref 3.9–10.3)

## 2013-02-04 MED ORDER — CIPROFLOXACIN HCL 250 MG PO TABS
250.0000 mg | ORAL_TABLET | Freq: Two times a day (BID) | ORAL | Status: DC
Start: 2013-02-04 — End: 2013-02-25

## 2013-02-04 MED ORDER — PEGFILGRASTIM INJECTION 6 MG/0.6ML
6.0000 mg | Freq: Once | SUBCUTANEOUS | Status: AC
  Administered 2013-02-04: 6 mg via SUBCUTANEOUS
  Filled 2013-02-04: qty 0.6

## 2013-02-04 NOTE — Patient Instructions (Addendum)

## 2013-02-04 NOTE — Progress Notes (Signed)
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 She had severe taxol aches especially in LE beginning day 3, beginning to improve today. She is neutropenic today but has had no fever or symptoms of infection. Plan is for pelvic RT after cycle 3 chemotherapy, then additional 3 cycles of chemo which we may be able to resume during HDR. She does not have PAC.  Patient presented with spotting and menorrhagia, last PAP 2 years prior. Biopsies by Dr Nicholaus Bloom 09-22-2012 (path [id number]) showed endometroid adenocarcinoma in endometrial biopsy and in endocervical curettage. She went to robotic hysterectomy with BSO by Dr Nelly Rout on 10-07-12 (no lymph nodes sampled as serosal cysts appeared malignant). Pathology [id number]) showed grade 2 endometroid adenocarcinoma with involvement of < 1/2 of myometrium, no LVSI, did involve into lower uterine segment, ovaries and tubes benign and the serosal cysts benign. CT AP 11-25-12 showed shotty bilateral external iliac nodes up to 1.3 cm and nonspecific < 5 mm left para-aortic nodes. She saw Dr Nelly Rout following the CT, with recommendation for chemotherapy and radiation in sandwich fashion. Patient cancelled her new patient visit with me in Jan, that rescheduled. She had consultation visit with Dr Roselind Messier on 12-29-12, who recommends pelvic radiation and intracavitary brachytherapy; she will have reevaluation by Dr Roselind Messier after 3 cycles of chemotherapy. Taxol/carboplatin was started 01-07-13, planned every 21 days x 6 cycles around RT.  She has lost her hair. Appetite has not been good since most recent chemo, tho she is eating a little and pushing fluids; she has not had frank nausea. Bowels  moved daily this cycle (tho she could not get miralax from pharmacy yet) and she has not had any of the severe abdominal pain. Tramadol was minimally helpful with taxol aches. She feels more fatigued especially today, but not SOB. No bleeding. No clear cardiac symptoms. Remainder of 10 point Review of Systems negative.  We have reviewed neutropenic precautions; she does have thermometer at home. She understands that neulasta may also cause aching.  Objective:  Vital signs in last 24 hours:  BP 109/74  Pulse 112  Temp(Src) 97.7 F (36.5 C) (Oral)  Resp 20  Ht 5\' 6"  (1.676 m)  Wt 279 lb 14.4 oz (126.962 kg)  BMI 45.2 kg/m2  LMP 09/20/2012  Weight is down ~ 6 lbs. Easily ambulatory, very pleasant and talkative, NAD. She is tearful describing the taxol aches.  HEENT:PERRLA, sclera clear, anicteric and oropharynx clear, no lesions Alopecia LymphaticsCervical, supraclavicular, and axillary nodes normal.No inguinal adenopathy Resp: clear to auscultation bilaterally and normal percussion bilaterally Cardio: regular rate and rhythm GI: soft, non-tender; bowel sounds normal; no masses,  no organomegaly Extremities: no pitting edema, cords or tenderness.Congenital birth defect right hand Neuro:no sensory deficits noted Skin without rash or ecchymoses  Lab Results:  Results for orders placed in visit on 02/04/13  CBC WITH DIFFERENTIAL      Result Value Range   WBC 2.0 (*) 3.9 - 10.3 10e3/uL   NEUT# 0.6 (*) 1.5 - 6.5 10e3/uL   HGB 12.2  11.6 - 15.9 g/dL   HCT 40.9  81.1 - 91.4 %   Platelets 169  145 - 400 10e3/uL   MCV 83.4  79.5 - 101.0 fL   MCH 27.7  25.1 - 34.0 pg   MCHC 33.2  31.5 - 36.0 g/dL   RBC 9.60  4.54 - 0.98 10e6/uL   RDW 15.7 (*) 11.2 - 14.5 %   lymph# 1.3  0.9 - 3.3 10e3/uL   MONO# 0.1  0.1 - 0.9 10e3/uL   Eosinophils Absolute 0.1  0.0 - 0.5 10e3/uL   Basophils Absolute 0.0  0.0 - 0.1 10e3/uL   NEUT% 29.8 (*) 38.4 - 76.8 %   LYMPH% 62.3 (*) 14.0 - 49.7 %   MONO%  4.4  0.0 - 14.0 %   EOS% 2.5  0.0 - 7.0 %   BASO% 1.0  0.0 - 2.0 %  COMPREHENSIVE METABOLIC PANEL (CC13)      Result Value Range   Sodium 138  136 - 145 mEq/L   Potassium 3.5  3.5 - 5.1 mEq/L   Chloride 101  98 - 107 mEq/L   CO2 26  22 - 29 mEq/L   Glucose 107 (*) 70 - 99 mg/dl   BUN 11.9  7.0 - 14.7 mg/dL   Creatinine 0.8  0.6 - 1.1 mg/dL   Total Bilirubin 8.29  0.20 - 1.20 mg/dL   Alkaline Phosphatase 72  40 - 150 U/L   AST 15  5 - 34 U/L   ALT 21  0 - 55 U/L   Total Protein 7.9  6.4 - 8.3 g/dL   Albumin 3.3 (*) 3.5 - 5.0 g/dL   Calcium 9.5  8.4 - 56.2 mg/dL     Studies/Results:  Screening mammograms 01-23-23 with possible mass in left breast; follow up imaging pending 02-20-13  Medications: I have reviewed the patient's current medications. She has received neulasta today. She will begin cipro 250 mg bid x 5 days.  Neutropenic precautions explained to patient, with written and oral instructions to call if temperature >100.5 Assessment/Plan:  1.IIIC endometrial carcinoma as above: post hysterectomy/BSO 10-07-12 with serosal cysts benign but adenopathy by subsequent CT. Continuing taxol/carboplatin with cycle 3 to be given 02-18-13 as long as counts have recovered and she is otherwise stable at next visit with midlevel on  02-13-13. I will see her 02-25-13 with neulasta then. She will be set up again with Dr Roselind Messier for RT to begin ~ 3 weeks after 02-18-13 chemotherapy. 2. Neutropenia secondary to chemotherapy: neulasta given today. Counts likely not yet at nadir. Prophylactic cipro x 5 days. Neutropenic precautions 3.global hypokinesis without ischemic findings by report of dobutamine echo in EMR. Cardiology follow up appointment still to be scheduled per patient's conversation with that office. Present chemotherapy not cardiotoxic  4.constipation improved, continue laxatives 5.degenerative arthritis  6.report of first screening mammograms available after visit, needs follow up imaging,  which [facility]13. morbid obesity: ongoing education by dietician  Reece Packer, MD   02/04/2013, 2:23 PM

## 2013-02-04 NOTE — Patient Instructions (Signed)
We will start an antibiotic to try to keep you from getting any infection while your white blood count is low -- cipro 250 mg every 12 hours.  Call if you have fever 100.5 or higher, or if you have symptoms of infection. Stay away from anyone who might be sick, including shopping or church until blood counts are better.  Call Dr Hochrein's office to make that appointment  [phone]

## 2013-02-09 ENCOUNTER — Telehealth: Payer: Self-pay | Admitting: Oncology

## 2013-02-09 NOTE — Telephone Encounter (Signed)
S/W THE PT AND SHE IS AWARE OF THE APPT TO SEE DR Roselind Messier IN RAD ONC 03/16/2013@10 :45AM

## 2013-02-13 ENCOUNTER — Ambulatory Visit (HOSPITAL_BASED_OUTPATIENT_CLINIC_OR_DEPARTMENT_OTHER): Payer: Medicaid Other | Admitting: Physician Assistant

## 2013-02-13 ENCOUNTER — Other Ambulatory Visit: Payer: Self-pay | Admitting: Oncology

## 2013-02-13 ENCOUNTER — Other Ambulatory Visit (HOSPITAL_BASED_OUTPATIENT_CLINIC_OR_DEPARTMENT_OTHER): Payer: Medicaid Other | Admitting: Lab

## 2013-02-13 ENCOUNTER — Telehealth: Payer: Self-pay

## 2013-02-13 ENCOUNTER — Telehealth: Payer: Self-pay | Admitting: Oncology

## 2013-02-13 VITALS — BP 114/80 | HR 98 | Temp 98.6°F | Resp 18 | Ht 66.0 in | Wt 282.2 lb

## 2013-02-13 DIAGNOSIS — C549 Malignant neoplasm of corpus uteri, unspecified: Secondary | ICD-10-CM

## 2013-02-13 DIAGNOSIS — C541 Malignant neoplasm of endometrium: Secondary | ICD-10-CM

## 2013-02-13 LAB — COMPREHENSIVE METABOLIC PANEL (CC13)
Albumin: 3.3 g/dL — ABNORMAL LOW (ref 3.5–5.0)
CO2: 26 mEq/L (ref 22–29)
Glucose: 101 mg/dl — ABNORMAL HIGH (ref 70–99)
Potassium: 2.6 mEq/L — CL (ref 3.5–5.1)
Sodium: 140 mEq/L (ref 136–145)
Total Bilirubin: 0.25 mg/dL (ref 0.20–1.20)
Total Protein: 7.7 g/dL (ref 6.4–8.3)

## 2013-02-13 LAB — CBC WITH DIFFERENTIAL/PLATELET
Basophils Absolute: 0 10*3/uL (ref 0.0–0.1)
Eosinophils Absolute: 0.1 10*3/uL (ref 0.0–0.5)
HCT: 36.1 % (ref 34.8–46.6)
HGB: 12.2 g/dL (ref 11.6–15.9)
LYMPH%: 30.3 % (ref 14.0–49.7)
MONO#: 0.6 10*3/uL (ref 0.1–0.9)
NEUT#: 4.6 10*3/uL (ref 1.5–6.5)
Platelets: 169 10*3/uL (ref 145–400)
RBC: 4.26 10*6/uL (ref 3.70–5.45)
WBC: 7.6 10*3/uL (ref 3.9–10.3)

## 2013-02-13 MED ORDER — POTASSIUM CHLORIDE CRYS ER 20 MEQ PO TBCR: EXTENDED_RELEASE_TABLET | ORAL | Status: DC

## 2013-02-13 NOTE — Telephone Encounter (Signed)
Spoke with Deborah [name] and told her that she needed to take KCL tabs.  3 tonight  and then 2 daily until 02-18-13 then 1 daily or as directed.  KCL 20 meq tabs called in today. Will repeat KCL on 02-18-13 with treatment.  She will also eat a  banana daily.   She is to continue the lasix 20 mg daily as prescribed by Dr. Lula Olszewski.  Pt. Verbalized understanding.

## 2013-02-13 NOTE — Telephone Encounter (Signed)
gv and printed appt schedule for pt for March and april

## 2013-02-16 ENCOUNTER — Telehealth: Payer: Self-pay

## 2013-02-16 NOTE — Patient Instructions (Addendum)
Return for chemotherapy as scheduled on 02/18/2013 Followup with Dr. Darrold Span as previously scheduled on 02/25/2013

## 2013-02-16 NOTE — Telephone Encounter (Signed)
Told Deborah [name] that it is fine for her to use Claritin 10 mg daily for aches after chemotherapy and neulasta injection as some patients report that this helps minimize aches.  Pt. Verbalized understanding.

## 2013-02-17 ENCOUNTER — Ambulatory Visit
Admission: RE | Admit: 2013-02-17 | Discharge: 2013-02-17 | Disposition: A | Discharge status: Home / Self Care | Payer: Medicaid Other | Source: Ambulatory Visit | Attending: Family Medicine | Admitting: Family Medicine

## 2013-02-17 ENCOUNTER — Other Ambulatory Visit: Payer: Self-pay

## 2013-02-17 DIAGNOSIS — C541 Malignant neoplasm of endometrium: Secondary | ICD-10-CM

## 2013-02-17 DIAGNOSIS — R928 Other abnormal and inconclusive findings on diagnostic imaging of breast: Secondary | ICD-10-CM

## 2013-02-17 MED ORDER — DEXAMETHASONE 4 MG PO TABS: ORAL_TABLET | ORAL | Status: DC

## 2013-02-17 NOTE — Progress Notes (Signed)
Told patient that a new prescription was sent to her pharmacy for decadron.  This is for 20 tabs for 2 treatments since transportation is an issue for her.  Pt. Verbalized understanding.

## 2013-02-17 NOTE — Progress Notes (Signed)
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 She had severe taxol aches especially in LE beginning day 3.she continues to have some aching in her legs primarily the knees and some in her arms related to the Taxol. She also reports some intermittent numbness in her left foot and left hand. She is due cycle 3 of her adjuvant chemotherapy on 02/18/2013. Plan is for pelvic RT after cycle 3 chemotherapy, then additional 3 cycles of chemo which we may be able to resume during HDR. She does not have PAC.  Patient presented with spotting and menorrhagia, last PAP 2 years prior. Biopsies by Dr Nicholaus Bloom 09-22-2012 (path [id number]) showed endometroid adenocarcinoma in endometrial biopsy and in endocervical curettage. She went to robotic hysterectomy with BSO by Dr Nelly Rout on 10-07-12 (no lymph nodes sampled as serosal cysts appeared malignant). Pathology [id number]) showed grade 2 endometroid adenocarcinoma with involvement of < 1/2 of myometrium, no LVSI, did involve into lower uterine segment, ovaries and tubes benign and the serosal cysts benign. CT AP 11-25-12 showed shotty bilateral external iliac nodes up to 1.3 cm and nonspecific < 5 mm left para-aortic nodes. She saw Dr Nelly Rout following the CT, with recommendation for chemotherapy and radiation in sandwich fashion. Patient cancelled her new patient visit with me in Jan, that rescheduled. She had consultation visit with Dr Roselind Messier on 12-29-12, who recommends pelvic radiation and intracavitary brachytherapy; she will have reevaluation by Dr Roselind Messier after 3 cycles of chemotherapy. Taxol/carboplatin was started 01-07-13, planned every 21 days x 6 cycles around RT.  She reports  some epigastric discomfort this morning that resolved after she had a bowel movement. She had no further episodes. As reported above she's had some intermittent numbness involving her left foot and left hand that does not affect her ability to ambulate or any fine motor coordination. She also has had some residual aching in her legs and arms related to the adjuvant chemotherapy. Her appetite has been good. She voiced no other specific complaints today.  Remainder of 10 point Review of Systems negative.    Objective:  Vital signs in last 24 hours:  BP 114/80  Pulse 98  Temp(Src) 98.6 F (37 C) (Oral)  Resp 18  Ht 5\' 6"  (1.676 m)  Wt 282 lb 3.2 oz (128.005 kg)  BMI 45.57 kg/m2  LMP 09/20/2012  Weight is up 2.3 pounds. Easily ambulatory, without assistance, very pleasant and talkative, NAD.   HEENT:PERRLA, sclera clear, anicteric and oropharynx clear, no lesions Alopecia LymphaticsCervical, supraclavicular, and axillary nodes normal.No inguinal adenopathy Resp: clear to auscultation bilaterally and normal percussion bilaterally Cardio: regular rate and rhythm GI: soft, non-tender; bowel sounds normal; no masses,  no organomegaly Extremities: no pitting edema, cords or tenderness.Congenital birth defect right hand Neuro:no sensory deficits noted Skin without rash or ecchymoses  Lab Results:  Results for orders placed in visit on 02/13/13  CBC WITH DIFFERENTIAL      Result Value Range   WBC 7.6  3.9 - 10.3 10e3/uL   NEUT# 4.6  1.5 - 6.5 10e3/uL   HGB 12.2  11.6 - 15.9 g/dL   HCT 40.9  81.1 - 91.4 %   Platelets 169  145 - 400 10e3/uL   MCV 84.9  79.5 - 101.0  fL   MCH 28.7  25.1 - 34.0 pg   MCHC 33.8  31.5 - 36.0 g/dL   RBC 1.61  0.96 - 0.45 10e6/uL   RDW 18.2 (*) 11.2 - 14.5 %   lymph# 2.3  0.9 - 3.3 10e3/uL   MONO# 0.6  0.1 - 0.9 10e3/uL   Eosinophils Absolute 0.1  0.0 - 0.5 10e3/uL   Basophils Absolute 0.0  0.0 - 0.1 10e3/uL   NEUT% 60.7  38.4 - 76.8 %   LYMPH% 30.3   14.0 - 49.7 %   MONO% 8.1  0.0 - 14.0 %   EOS% 0.7  0.0 - 7.0 %   BASO% 0.2  0.0 - 2.0 %  COMPREHENSIVE METABOLIC PANEL (CC13)      Result Value Range   Sodium 140  136 - 145 mEq/L   Potassium 2.6 Repeated and Verified (*) 3.5 - 5.1 mEq/L   Chloride 101  98 - 107 mEq/L   CO2 26  22 - 29 mEq/L   Glucose 101 (*) 70 - 99 mg/dl   BUN 40.9  7.0 - 81.1 mg/dL   Creatinine 0.8  0.6 - 1.1 mg/dL   Total Bilirubin 9.14  0.20 - 1.20 mg/dL   Alkaline Phosphatase 94  40 - 150 U/L   AST 19  5 - 34 U/L   ALT 35  0 - 55 U/L   Total Protein 7.7  6.4 - 8.3 g/dL   Albumin 3.3 (*) 3.5 - 5.0 g/dL   Calcium 9.3  8.4 - 78.2 mg/dL     Studies/Results:  Screening mammograms 01-23-23 with possible mass in left breast; follow up imaging pending 02-20-13  Medications: I have reviewed the patient's current medications. Patient discussed with Dr. Darrold Span. She will proceed with her third cycle of adjuvant chemotherapy with Taxol and carboplatin as planned on 02/18/2013 with Neulasta injection plan for 02/23/2013. She also has an appointment with Zenovia Jarred our dietitian on 02/18/2013. She'll followup with Dr. Darrold Span as planned on 02/25/2013.   Assessment/Plan:  1.IIIC endometrial carcinoma as above: post hysterectomy/BSO 10-07-12 with serosal cysts benign but adenopathy by subsequent CT. Continuing taxol/carboplatin with cycle 3 to be given 02-18-13. To see Dr. Darrold Span on 02-25-13 with neulasta on 02/23/2013. She will be set up again with Dr Roselind Messier for RT to begin ~ 3 weeks after 02-18-13 chemotherapy. 2. Neutropenia secondary to chemotherapy: resolved.  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 Cardiology follow up appointment still to be scheduled per patient's conversation with that office. Present chemotherapy not cardiotoxic  4.constipation improved, continue laxatives 5.degenerative arthritis  6.report of first screening mammograms available after visit, needs follow  up imaging, which [facility]13. morbid obesity: ongoing education by dietician  Conni Slipper, PA-C   02/17/2013,

## 2013-02-18 ENCOUNTER — Telehealth: Payer: Self-pay

## 2013-02-18 ENCOUNTER — Ambulatory Visit (HOSPITAL_BASED_OUTPATIENT_CLINIC_OR_DEPARTMENT_OTHER): Payer: Medicaid Other

## 2013-02-18 ENCOUNTER — Ambulatory Visit (HOSPITAL_BASED_OUTPATIENT_CLINIC_OR_DEPARTMENT_OTHER): Payer: Medicaid Other | Admitting: Lab

## 2013-02-18 ENCOUNTER — Ambulatory Visit: Payer: Medicaid Other | Admitting: Nutrition

## 2013-02-18 VITALS — BP 121/83 | HR 109 | Temp 98.8°F | Resp 20

## 2013-02-18 DIAGNOSIS — C549 Malignant neoplasm of corpus uteri, unspecified: Secondary | ICD-10-CM

## 2013-02-18 DIAGNOSIS — C541 Malignant neoplasm of endometrium: Secondary | ICD-10-CM

## 2013-02-18 DIAGNOSIS — Z5111 Encounter for antineoplastic chemotherapy: Secondary | ICD-10-CM

## 2013-02-18 LAB — CBC WITH DIFFERENTIAL/PLATELET
Eosinophils Absolute: 0 10*3/uL (ref 0.0–0.5)
MONO#: 0 10*3/uL — ABNORMAL LOW (ref 0.1–0.9)
NEUT#: 3.5 10*3/uL (ref 1.5–6.5)
RBC: 4.7 10*6/uL (ref 3.70–5.45)
RDW: 17.5 % — ABNORMAL HIGH (ref 11.2–14.5)
WBC: 4.1 10*3/uL (ref 3.9–10.3)
nRBC: 0 % (ref 0–0)

## 2013-02-18 LAB — BASIC METABOLIC PANEL (CC13)
CO2: 22 mEq/L (ref 22–29)
Chloride: 104 mEq/L (ref 98–107)
Sodium: 136 mEq/L (ref 136–145)

## 2013-02-18 MED ORDER — DIPHENHYDRAMINE HCL 50 MG/ML IJ SOLN
50.0000 mg | Freq: Once | INTRAMUSCULAR | Status: AC
  Administered 2013-02-18: 50 mg via INTRAVENOUS

## 2013-02-18 MED ORDER — SODIUM CHLORIDE 0.9 % IV SOLN
750.0000 mg | Freq: Once | INTRAVENOUS | Status: AC
  Administered 2013-02-18: 750 mg via INTRAVENOUS
  Filled 2013-02-18: qty 75

## 2013-02-18 MED ORDER — FAMOTIDINE IN NACL 20-0.9 MG/50ML-% IV SOLN
20.0000 mg | Freq: Once | INTRAVENOUS | Status: AC
  Administered 2013-02-18: 20 mg via INTRAVENOUS

## 2013-02-18 MED ORDER — PACLITAXEL CHEMO INJECTION 300 MG/50ML
432.0000 mg | Freq: Once | INTRAVENOUS | Status: AC
  Administered 2013-02-18: 432 mg via INTRAVENOUS
  Filled 2013-02-18: qty 72

## 2013-02-18 MED ORDER — ONDANSETRON 16 MG/50ML IVPB (CHCC)
16.0000 mg | Freq: Once | INTRAVENOUS | Status: AC
  Administered 2013-02-18: 16 mg via INTRAVENOUS

## 2013-02-18 MED ORDER — DEXAMETHASONE SODIUM PHOSPHATE 4 MG/ML IJ SOLN
20.0000 mg | Freq: Once | INTRAMUSCULAR | Status: AC
  Administered 2013-02-18: 20 mg via INTRAVENOUS

## 2013-02-18 MED ORDER — SODIUM CHLORIDE 0.9 % IV SOLN
175.0000 mg/m2 | Freq: Once | INTRAVENOUS | Status: DC
  Filled 2013-02-18: qty 72

## 2013-02-18 MED ORDER — SODIUM CHLORIDE 0.9 % IV SOLN
Freq: Once | INTRAVENOUS | Status: AC
  Administered 2013-02-18: 13:00:00 via INTRAVENOUS

## 2013-02-18 NOTE — Telephone Encounter (Signed)
Potassium level today up to 4.2.  Dr. Darrold Span wants her to take KCL 20 meq M-W-F snice she is on lasix.   Gave Deborah [name] written instructions.  She verbalized understanding.

## 2013-02-18 NOTE — Patient Instructions (Addendum)
[facility] Discharge Instructions for Patients Receiving Chemotherapy  Today you received the following chemotherapy agents Taxol and Carboplatin.  To help prevent nausea and vomiting after your treatment, we encourage you to take your nausea medication.   If you develop nausea and vomiting that is not controlled by your nausea medication, call the clinic. If it is after clinic hours your family physician or the after hours number for the clinic or go to the Emergency Department.   BELOW ARE SYMPTOMS THAT SHOULD BE REPORTED IMMEDIATELY:  *FEVER GREATER THAN 100.5 F  *CHILLS WITH OR WITHOUT FEVER  NAUSEA AND VOMITING THAT IS NOT CONTROLLED WITH YOUR NAUSEA MEDICATION  *UNUSUAL SHORTNESS OF BREATH  *UNUSUAL BRUISING OR BLEEDING  TENDERNESS IN MOUTH AND THROAT WITH OR WITHOUT PRESENCE OF ULCERS  *URINARY PROBLEMS  *BOWEL PROBLEMS  UNUSUAL RASH Items with * indicate a potential emergency and should be followed up as soon as possible.  One of the nurses will contact you 24 hours after your treatment. Please let the nurse know about any problems that you may have experienced. Feel free to call the clinic you have any questions or concerns. The clinic phone number is [phone].   I have been informed and understand all the instructions given to me. I know to contact the clinic, my physician, or go to the Emergency Department if any problems should occur. I do not have any questions at this time, but understand that I may call the clinic during office hours   should I have any questions or need assistance in obtaining follow up care.    __________________________________________  _____________  __________ Signature of Patient or Authorized Representative            Date                   Time    __________________________________________ Nurse's Signature    

## 2013-02-18 NOTE — Progress Notes (Signed)
Patient reports she is doing well. Her appetite has improved. She denies nausea and vomiting. Her weight has declined slightly to 282 pounds. She has no concerns today. She continues to drink oral nutrition supplements.  Nutrition diagnosis:  Unintended weight loss continues.  Intervention: I've educated patient on the importance of continuing small, frequent small meals utilizing healthy, lean protein foods. She will continue to drink Ensure Plus if she is unable to eat.  Monitoring, evaluation, goals: Patient has been able to tolerate oral intake however has had some weight loss.  Next visit: No followup scheduled at this time patient agrees to call me if she has questions or concerns.

## 2013-02-20 ENCOUNTER — Other Ambulatory Visit: Payer: Medicaid Other

## 2013-02-23 ENCOUNTER — Telehealth: Payer: Self-pay

## 2013-02-23 ENCOUNTER — Ambulatory Visit: Payer: Medicaid Other

## 2013-02-23 NOTE — Telephone Encounter (Signed)
Deborah [name] is too achy to come in for neulasta injection today.  Rescheduled injection for 02-24-13 at 1115.

## 2013-02-24 ENCOUNTER — Ambulatory Visit (HOSPITAL_BASED_OUTPATIENT_CLINIC_OR_DEPARTMENT_OTHER): Payer: Medicaid Other

## 2013-02-24 VITALS — BP 106/77 | HR 108 | Temp 98.9°F

## 2013-02-24 DIAGNOSIS — C541 Malignant neoplasm of endometrium: Secondary | ICD-10-CM

## 2013-02-24 DIAGNOSIS — Z5189 Encounter for other specified aftercare: Secondary | ICD-10-CM

## 2013-02-24 DIAGNOSIS — C549 Malignant neoplasm of corpus uteri, unspecified: Secondary | ICD-10-CM

## 2013-02-24 MED ORDER — PEGFILGRASTIM INJECTION 6 MG/0.6ML
6.0000 mg | Freq: Once | SUBCUTANEOUS | Status: AC
  Administered 2013-02-24: 6 mg via SUBCUTANEOUS
  Filled 2013-02-24: qty 0.6

## 2013-02-25 ENCOUNTER — Ambulatory Visit (HOSPITAL_BASED_OUTPATIENT_CLINIC_OR_DEPARTMENT_OTHER): Payer: Medicaid Other | Admitting: Oncology

## 2013-02-25 ENCOUNTER — Other Ambulatory Visit: Payer: Medicaid Other | Admitting: Lab

## 2013-02-25 ENCOUNTER — Other Ambulatory Visit (HOSPITAL_BASED_OUTPATIENT_CLINIC_OR_DEPARTMENT_OTHER): Payer: Medicaid Other | Admitting: Lab

## 2013-02-25 ENCOUNTER — Telehealth: Payer: Self-pay | Admitting: Oncology

## 2013-02-25 ENCOUNTER — Encounter: Payer: Self-pay | Admitting: Oncology

## 2013-02-25 ENCOUNTER — Ambulatory Visit: Payer: Medicaid Other | Admitting: Oncology

## 2013-02-25 ENCOUNTER — Other Ambulatory Visit: Payer: Self-pay

## 2013-02-25 VITALS — BP 108/72 | HR 101 | Temp 98.2°F | Resp 22 | Ht 66.0 in | Wt 278.8 lb

## 2013-02-25 DIAGNOSIS — K5900 Constipation, unspecified: Secondary | ICD-10-CM

## 2013-02-25 DIAGNOSIS — C549 Malignant neoplasm of corpus uteri, unspecified: Secondary | ICD-10-CM

## 2013-02-25 DIAGNOSIS — E669 Obesity, unspecified: Secondary | ICD-10-CM

## 2013-02-25 DIAGNOSIS — E876 Hypokalemia: Secondary | ICD-10-CM

## 2013-02-25 DIAGNOSIS — C541 Malignant neoplasm of endometrium: Secondary | ICD-10-CM

## 2013-02-25 LAB — COMPREHENSIVE METABOLIC PANEL (CC13)
CO2: 29 mEq/L (ref 22–29)
Calcium: 9 mg/dL (ref 8.4–10.4)
Chloride: 98 mEq/L (ref 98–107)
Glucose: 107 mg/dl — ABNORMAL HIGH (ref 70–99)
Sodium: 138 mEq/L (ref 136–145)
Total Bilirubin: 0.38 mg/dL (ref 0.20–1.20)
Total Protein: 7.7 g/dL (ref 6.4–8.3)

## 2013-02-25 LAB — CBC WITH DIFFERENTIAL/PLATELET
BASO%: 0.4 % (ref 0.0–2.0)
Eosinophils Absolute: 0 10*3/uL (ref 0.0–0.5)
LYMPH%: 16.3 % (ref 14.0–49.7)
MCHC: 33.6 g/dL (ref 31.5–36.0)
MONO#: 0.2 10*3/uL (ref 0.1–0.9)
MONO%: 2.5 % (ref 0.0–14.0)
NEUT#: 5.5 10*3/uL (ref 1.5–6.5)
Platelets: 145 10*3/uL (ref 145–400)
RBC: 4.1 10*6/uL (ref 3.70–5.45)
RDW: 18.6 % — ABNORMAL HIGH (ref 11.2–14.5)
WBC: 6.9 10*3/uL (ref 3.9–10.3)

## 2013-02-25 MED ORDER — POTASSIUM CHLORIDE CRYS ER 20 MEQ PO TBCR: 20.0000 meq | EXTENDED_RELEASE_TABLET | Freq: Every day | ORAL | Status: DC

## 2013-02-25 NOTE — Patient Instructions (Signed)
Your potassium is low again today. Take 2 potassium pills (total 40 mg) when you get home, then one daily  If time of next appointment to Dr Darrold Span does not work well with your radiation schedule, call and let us know to move the time if possible

## 2013-02-25 NOTE — Progress Notes (Signed)
OFFICE PROGRESS NOTE   02/25/2013   Physicians:W.Brewster, J.Kinard, M.Billey Co (PCP [facility]) J.Hochrein   INTERVAL HISTORY:   Patient is seen, alone for visit, in scheduled follow up of adjuvant chemotherapy for IIIC grade 2 endometrial cancer, cycle 3 taxol/ carboplatin given 02-18-13 with neulasta 02-24-13. She will see Dr Roselind Messier on 03-12-13, then we anticipate 3 additional cycles of chemotherapy after external beam RT.  Patient presented with spotting and menorrhagia, last PAP 2 years prior. Biopsies by Dr Nicholaus Bloom 09-22-2012 (path [id number]) showed endometroid adenocarcinoma in endometrial biopsy and in endocervical curettage. She went to robotic hysterectomy with BSO by Dr Nelly Rout on 10-07-12 (no lymph nodes sampled as serosal cysts appeared malignant). Pathology [id number]) showed grade 2 endometroid adenocarcinoma with involvement of < 1/2 of myometrium, no LVSI, did involve into lower uterine segment, ovaries and tubes benign and the serosal cysts benign. CT AP 11-25-12 showed shotty bilateral external iliac nodes up to 1.3 cm and nonspecific < 5 mm left para-aortic nodes. She saw Dr Nelly Rout following the CT, with recommendation for chemotherapy and radiation in sandwich fashion. Patient cancelled her new patient visit with me in Jan, that rescheduled. She had consultation visit with Dr Roselind Messier on 12-29-12, with recommendation for pelvic radiation and intracavitary brachytherapy after 3 cycles of chemotherapy. Cycle 1 taxol/carboplatin was given 01-07-13,  As with previous cycles of chemotherapy, bone aches were main problem after most recent treatment.She had one episode of vomiting, then intermittent nausea and poor appetite otherwise. Anitiemetics have helped and she is eating small amounts and drinking fluids. Constipation has been manageable. She has had no fever or symptoms of infection, no other pain, no increased shortness of breath, no peripheral neuropathy  symptoms. Remainder of 10 point Review of Systems negative.  Objective:  Vital signs in last 24 hours:  BP 108/72  Pulse 101  Temp(Src) 98.2 F (36.8 C) (Oral)  Resp 22  Ht 5\' 6"  (1.676 m)  Wt 278 lb 12.8 oz (126.463 kg)  BMI 45.02 kg/m2  LMP 09/20/2012 Weight is down ~ 3 lbs Ambulatory, looks comfortable today, alopecia.   HEENT:PERRLA, sclera clear, anicteric and oropharynx clear, no lesions LymphaticsCervical, supraclavicular, and axillary nodes normal. Resp: clear to auscultation bilaterally and normal percussion bilaterally Cardio: regular rate and rhythm GI: obese, soft, nontender, few bowel sounds, no appreciable HSM or mass Extremities: extremities normal, atraumatic, no cyanosis or edema Neuro:no sensory deficits noted Skin without rash or ecchymosis including at site of treatment IV.   Lab Results:  Results for orders placed in visit on 02/25/13  CBC WITH DIFFERENTIAL      Result Value Range   WBC 6.9  3.9 - 10.3 10e3/uL   NEUT# 5.5  1.5 - 6.5 10e3/uL   HGB 12.0  11.6 - 15.9 g/dL   HCT 82.9  56.2 - 13.0 %   Platelets 145  145 - 400 10e3/uL   MCV 86.8  79.5 - 101.0 fL   MCH 29.1  25.1 - 34.0 pg   MCHC 33.6  31.5 - 36.0 g/dL   RBC 8.65  7.84 - 6.96 10e6/uL   RDW 18.6 (*) 11.2 - 14.5 %   lymph# 1.1  0.9 - 3.3 10e3/uL   MONO# 0.2  0.1 - 0.9 10e3/uL   Eosinophils Absolute 0.0  0.0 - 0.5 10e3/uL   Basophils Absolute 0.0  0.0 - 0.1 10e3/uL   NEUT% 80.2 (*) 38.4 - 76.8 %   LYMPH% 16.3  14.0 - 49.7 %   MONO%  2.5  0.0 - 14.0 %   EOS% 0.6  0.0 - 7.0 %   BASO% 0.4  0.0 - 2.0 %  COMPREHENSIVE METABOLIC PANEL (CC13)      Result Value Range   Sodium 138  136 - 145 mEq/L   Potassium 2.9 Repeated and Verified (*) 3.5 - 5.1 mEq/L   Chloride 98  98 - 107 mEq/L   CO2 29  22 - 29 mEq/L   Glucose 107 (*) 70 - 99 mg/dl   BUN 28.4  7.0 - 13.2 mg/dL   Creatinine 1.0  0.6 - 1.1 mg/dL   Total Bilirubin 4.40  0.20 - 1.20 mg/dL   Alkaline Phosphatase 72  40 - 150 U/L    AST 12  5 - 34 U/L   ALT 21  0 - 55 U/L   Total Protein 7.7  6.4 - 8.3 g/dL   Albumin 3.2 (*) 3.5 - 5.0 g/dL   Calcium 9.0  8.4 - 10.2 mg/dL     Studies/Results: DIGITAL DIAGNOSTIC BILATERAL LIMITED MAMMOGRAM AND BILATERAL BREAST ULTRASOUND: 02-17-13 Comparison: None.  ACR Breast Density Category 1: The breast tissue is almost entirely  fatty.  Additional views confirm the presence of a circumscribed oval  gently lobulated nodule in the inferior portion of the right breast  and a similar nodule in the lower inner quadrant of the left  breast.  On physical exam, no mass is palpated in either breast.  Ultrasound is performed, showing an oval hypoechoic horizontally  oriented well-defined homogeneous solid mass at 8 o'clock 10 cm  from the left nipple measuring 1.0 x 1.4 x 0.6 cm. There is a  similar mass at 6 o'clock 12 cm from the left nipple measuring 1.6  x 0.5 x 0.8 cm. 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 Short  interval follow-up mammogram in 6 months is recommended.  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  RECOMMENDATION:  Bilateral diagnostic mammogram in 6 months.   Medications: I have reviewed the patient's current medications. Patient is instructed to take total 40 mEq potassium today then 20 mEq daily.  Assessment/Plan:  1.IIIC endometrial carcinoma as above: post hysterectomy/BSO 10-07-12 with serosal cysts benign but adenopathy by subsequent CT.  Taxol/carboplatin cycle 3 given 02-18-13 with neulasta. She will have RT by Dr Roselind Messier, then 3 additional cycles of chemotherapy. I will see her back towards end of external beam RT and coordinate further chemotherapy from there. 2. 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 Cardiology follow up appointment still to be scheduled per patient's conversation with that office.  Present chemotherapy not cardiotoxic  4.constipation improved, continue laxatives  5.degenerative arthritis  6.morbid obesity: ongoing education by dietician 7.hypokalemia: supplement as above. Will recheck Bmet and CBC when she is at [facility] for RT appointment 03-12-13. Patient had questions answered to her satisfaction and is in agreement with plan above.   LIVESAY,LENNIS P, MD   02/25/2013, 10:49 AM

## 2013-02-27 ENCOUNTER — Telehealth: Payer: Self-pay

## 2013-02-27 NOTE — Telephone Encounter (Signed)
Ms. Catanese began with pain in lower back, chest, and arms yesterday.  Told her that the injection of neulasta she received on 02-24-13 is time released and puts her bone marrow in over drive and causing the pain in lwerback and chast because that is where a lot of white cells ar made.  Pt. using tylenol for pain.  She ust wanted to know if everything was ok.  Told her the aches may be for several more days.  Pt. Verbalized understanding.

## 2013-03-04 ENCOUNTER — Telehealth: Payer: Self-pay | Admitting: *Deleted

## 2013-03-04 NOTE — Telephone Encounter (Signed)
Pt called to say she is having numbness and tingling in feet and hands. Discussed this as a side effect of the chemo.

## 2013-03-09 ENCOUNTER — Ambulatory Visit: Payer: Medicaid Other | Admitting: Radiation Oncology

## 2013-03-11 ENCOUNTER — Encounter: Payer: Self-pay | Admitting: Radiation Oncology

## 2013-03-11 NOTE — Progress Notes (Signed)
GYN Location of Tumor / Histology: IIIC endometrial carcinoma   Patient presented with spotting and menorrhagia in October.  Biopsies revealed:  biopsy by Dr. Marice Potter 09/22/2012 showed endometroid adenocarcinoma  Past/Anticipated interventions by Gyn/Onc surgery, if any: robotic hysterectomy with BSO by Dr. Nelly Rout on 10/07/12  Past/Anticipated interventions by medical oncology, if any: Taxol/carboplatin cycle 3 given 02/18/13 with neulasta. Following RT coordination of 3 additional cycles of chemotherapy will be planned.  Weight changes, if any: on going education by dietician  Bowel/Bladder complaints, if any: Constipation has been manageable.  Nausea/Vomiting, if any: one episode of vomiting, then intermittent nausea and poor appetite otherwise antiemetics have help and she is eating small amounts of food and drinking fluids.  Pain issues, if any:    SAFETY ISSUES:  Prior radiation? NO  Pacemaker/ICD? NO  Possible current pregnancy? NO  Is the patient on methotrexate? NO  Current Complaints / other details:    47 year old single female. G2P1.   Grade 2 endometrial adenocarcinoma. S/P laparoscopic hysterectomy bilateral salpingo oophorectomy. Diffuse miliary cystic like lesion were noted in the cul de sac on the rectosigmoid colon highly suspicious for metastatic disease. No lymphovascular space invasion and no cervical involvement.   NKDA  No hx of radiation therapy  No indication of a pacemaker

## 2013-03-12 ENCOUNTER — Other Ambulatory Visit (HOSPITAL_BASED_OUTPATIENT_CLINIC_OR_DEPARTMENT_OTHER): Payer: Medicaid Other | Admitting: Lab

## 2013-03-12 ENCOUNTER — Ambulatory Visit
Admission: RE | Admit: 2013-03-12 | Discharge: 2013-03-12 | Disposition: A | Discharge status: Home / Self Care | Payer: Medicaid Other | Source: Ambulatory Visit | Attending: Radiation Oncology | Admitting: Radiation Oncology

## 2013-03-12 ENCOUNTER — Encounter: Payer: Self-pay | Admitting: Radiation Oncology

## 2013-03-12 VITALS — BP 117/90 | HR 103 | Temp 98.5°F | Resp 20 | Wt 279.7 lb

## 2013-03-12 DIAGNOSIS — Z79899 Other long term (current) drug therapy: Secondary | ICD-10-CM | POA: Insufficient documentation

## 2013-03-12 DIAGNOSIS — C549 Malignant neoplasm of corpus uteri, unspecified: Secondary | ICD-10-CM | POA: Insufficient documentation

## 2013-03-12 DIAGNOSIS — C541 Malignant neoplasm of endometrium: Secondary | ICD-10-CM

## 2013-03-12 DIAGNOSIS — Z7982 Long term (current) use of aspirin: Secondary | ICD-10-CM | POA: Insufficient documentation

## 2013-03-12 DIAGNOSIS — E669 Obesity, unspecified: Secondary | ICD-10-CM

## 2013-03-12 LAB — CBC WITH DIFFERENTIAL/PLATELET
BASO%: 1 % (ref 0.0–2.0)
Eosinophils Absolute: 0 10*3/uL (ref 0.0–0.5)
LYMPH%: 32.4 % (ref 14.0–49.7)
MONO#: 0.4 10*3/uL (ref 0.1–0.9)
NEUT#: 2.1 10*3/uL (ref 1.5–6.5)
Platelets: 221 10*3/uL (ref 145–400)
RBC: 4.1 10*6/uL (ref 3.70–5.45)
RDW: 19.9 % — ABNORMAL HIGH (ref 11.2–14.5)
WBC: 3.8 10*3/uL — ABNORMAL LOW (ref 3.9–10.3)
lymph#: 1.2 10*3/uL (ref 0.9–3.3)

## 2013-03-12 LAB — BASIC METABOLIC PANEL (CC13)
BUN: 18.3 mg/dL (ref 7.0–26.0)
CO2: 27 mEq/L (ref 22–29)
Calcium: 9.5 mg/dL (ref 8.4–10.4)
Creatinine: 0.9 mg/dL (ref 0.6–1.1)

## 2013-03-12 NOTE — Progress Notes (Signed)
Pt denies pain, reports fatigue, weakness. She states she has not eaten today, has loss of appetite, denies nausea. She denies urinary issues, was constipated yesterday but had BM today. Pt states she "had every side effect from chemo, was in bed x 1 week". Her last chemo tx was 02/18/13.  Pt alone today but her 45 yr old son is living with her.

## 2013-03-12 NOTE — Progress Notes (Signed)
Please see the Nurse Progress Note in the MD Initial Consult Encounter for this patient. 

## 2013-03-12 NOTE — Progress Notes (Signed)
Radiation Oncology         (336) [phone] ________________________________  Name: Deborah [name] MRN: [id number]  Date: 03/12/2013  DOB: September 06, 1966  Reevaluation note  CC: Ardyth Gal, MD  Reece Packer, MD  Diagnosis:   Stage III-C1 grade 2 endometrial adenocarcinoma  Narrative:  The patient returns today for for further evaluation. She was initially seen in consultation on 12/29/2012. Since that time the patient has proceeded with her initial 3 cycles of chemotherapy.  she seems to have tolerated the treatment well. She has some mild fatigue at this time.  She denies any vaginal bleeding or pelvic pain.                            ALLERGIES:  has No Known Allergies.  Meds: Current Outpatient Prescriptions  Medication Sig Dispense Refill  . aspirin 81 MG tablet Take 81 mg by mouth every morning.       . bifidobacterium infantis (ALIGN) capsule Take 1 capsule by mouth daily as needed.      . bisacodyl (DULCOLAX) 5 MG EC tablet Take 5 mg by mouth daily as needed for constipation.      [name] dexamethasone (DECADRON) 4 MG tablet Take 5 tablets with food 12 hrs. and 6 hrs. Prior to Taxol chemotherapy.  20 tablet  1  . ferrous sulfate 325 (65 FE) MG tablet 325 mg every evening. Takes twice a week      . furosemide (LASIX) 20 MG tablet Take 1 tablet (20 mg total) by mouth every morning.  30 tablet  3  . lactase (LACTAID) 3000 UNITS tablet Take 1 tablet by mouth 3 (three) times daily with meals as needed.      . loratadine (CLARITIN) 10 MG tablet Take 10 mg by mouth daily.      . ondansetron (ZOFRAN) 8 MG tablet Take 1-2 tablets (8-16 mg total) by mouth every 8 (eight) hours as needed for nausea (Will not make you drowsy).  30 tablet  1  . OVER THE COUNTER MEDICATION Allergy Plus Sinus Headache- acetaminophen 325, diphenhydramine 12.5, phenylephrine 5 mg      . OVER THE COUNTER MEDICATION Medi-phenyl 5 mg      . polyethylene glycol powder (MIRALAX) powder Take 17 g by mouth 2 (two) times  daily as needed.  255 g  2  . potassium chloride SA (K-DUR,KLOR-CON) 20 MEQ tablet Take 1 tablet (20 mEq total) by mouth daily.  30 tablet  1  . prochlorperazine (COMPAZINE) 10 MG tablet Take 1 tablet (10 mg total) by mouth every 6 (six) hours as needed.  20 tablet  0  . sertraline (ZOLOFT) 100 MG tablet Take 100 mg by mouth every evening.      . simethicone (MYLICON) 125 MG chewable tablet Chew 125 mg by mouth every 6 (six) hours as needed for flatulence.      . traMADol (ULTRAM) 50 MG tablet Take 1 tablet (50 mg total) by mouth every 8 (eight) hours as needed for pain.  30 tablet  2   No current facility-administered medications for this encounter.    Physical Findings: The patient is in no acute distress. Patient is alert and oriented.  weight is 279 lb 11.2 oz (126.871 kg). Her oral temperature is 98.5 F (36.9 C). Her blood pressure is 117/90 and her pulse is 103. Her respiration is 20. [name]  No palpable supraclavicular or axillary adenopathy. The lungs are clear to auscultation.  The heart has regular rhythm and rate. The abdomen is soft and nontender with normal bowel sounds. There is no inguinal adenopathy appreciated.  On pelvic examination the external genitalia are unremarkable.  A speculum exam is performed. The exam is difficult in light of the patient's body habitus. Despite using a extra long speculum I was unable to get a good view of the vaginal cuff. However on digital examination the cuff palpated to be intact. There were some sutures palpable along the left vaginal cuff area.  Lab Findings: Lab Results  Component Value Date   WBC 3.8* 03/12/2013   HGB 12.1 03/12/2013   HCT 36.6 03/12/2013   MCV 89.2 03/12/2013   PLT 221 03/12/2013    @LASTCHEM @  Radiographic Findings: US Breast Bilateral  02/17/2013  *RADIOLOGY REPORT*  Clinical Data:  The patient returns for evaluation of a possible mass in each breast noted on recent baseline screening mammogram dated 01/22/2013.  The  patient is currently undergoing chemotherapy for endometrial cancer.  She begins radiation therapy soon.  DIGITAL DIAGNOSTIC BILATERAL LIMITED MAMMOGRAM  AND BILATERAL BREAST ULTRASOUND:  Comparison:  None.  Findings:  ACR Breast Density Category 1: The breast tissue is almost entirely fatty.  Additional views confirm the presence of a circumscribed oval gently lobulated nodule in the inferior portion of the right breast and a similar nodule in the lower inner quadrant of the left breast.  On physical exam, no mass is palpated in either breast.  Ultrasound is performed, showing an oval hypoechoic horizontally oriented well-defined homogeneous solid mass at 8 o'clock 10 cm from the left nipple measuring 1.0 x 1.4 x 0.6 cm.  There is a similar mass at 6 o'clock 12 cm from the left nipple measuring 1.6 x 0.5 x 0.8 cm.  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  Short interval follow-up mammogram in 6 months is recommended.  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  RECOMMENDATION: Bilateral diagnostic mammogram in 6 months.  I have discussed the findings and recommendations with the patient. Results were also provided in writing at the conclusion of the visit.  BI-RADS CATEGORY 3:  Probably benign finding(s) - short interval follow-up suggested.   Original Report Authenticated By: Cain Saupe, M.D.    [facility]  02/17/2013  *RADIOLOGY REPORT*  Clinical Data:  The patient returns for evaluation of a possible mass in each breast noted on recent baseline screening mammogram dated 01/22/2013.  The patient is currently undergoing chemotherapy for endometrial cancer.  She begins radiation therapy soon.  DIGITAL DIAGNOSTIC BILATERAL LIMITED MAMMOGRAM  AND BILATERAL BREAST ULTRASOUND:  Comparison:  None.  Findings:  ACR Breast Density Category 1: The breast tissue is almost entirely fatty.  Additional views confirm the presence of a circumscribed oval gently lobulated nodule in  the inferior portion of the right breast and a similar nodule in the lower inner quadrant of the left breast.  On physical exam, no mass is palpated in either breast.  Ultrasound is performed, showing an oval hypoechoic horizontally oriented well-defined homogeneous solid mass at 8 o'clock 10 cm from the left nipple measuring 1.0 x 1.4 x 0.6 cm.  There is a similar mass at 6 o'clock 12 cm from the left nipple measuring 1.6 x 0.5 x 0.8 cm.  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  Short interval follow-up mammogram in 6 months is recommended.  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  RECOMMENDATION: Bilateral diagnostic mammogram in 6 months.  I have  discussed the findings and recommendations with the patient. Results were also provided in writing at the conclusion of the visit.  BI-RADS CATEGORY 3:  Probably benign finding(s) - short interval follow-up suggested.   Original Report Authenticated By: Cain Saupe, M.D.     Impression:  Stage III-C1 grade 2 endometrial adenocarcinoma.  The patient has completed her initial 3 cycles of chemotherapy and seems to have tolerated this well. She is now ready to proceed with pelvic radiation therapy.  I am recommending intensity modulated ration therapy since her uterus and cervix were then removed. This will reduce dose to small bowel in the upper pelvis area and to potentially reduce dose to the  bone marrow within the pelvis.  She is at risk for vaginal vault recurrence given her initial curettings of the endocervical canal showing adenocarcinoma and the location of the lesion within the lower uterine segment.  I would therefore also recommend vaginal vault brachytherapy as part of her treatment.  Plan:  Simulation and planning on 03/17/2013.  She will receive 25 treatments directed at the pelvis area followed by 3 weekly high-dose rate intracavitary brachytherapy treatments directed at the proximal  vagina.  _____________________________________  -----------------------------------  Billie Lade, PhD, MD

## 2013-03-13 ENCOUNTER — Telehealth: Payer: Self-pay | Admitting: Oncology

## 2013-03-13 ENCOUNTER — Telehealth: Payer: Self-pay

## 2013-03-13 NOTE — Addendum Note (Signed)
Encounter addended by: Agnes Lawrence, RN on: 03/13/2013  9:40 AM<BR>     Documentation filed: Charges VN

## 2013-03-13 NOTE — Telephone Encounter (Signed)
Deborah [name] and advised on appt moved to 5.30.14 due to Dr. Cleophas Dunker being out of the office .Marland KitchenMarland Kitchenpt ok and aware

## 2013-03-13 NOTE — Telephone Encounter (Signed)
Told Deborah [name] that her KCL was 3.4 on 03-12-13.  Continue with Potassium tab 20 meq daily per Dr. Darrold Span.

## 2013-03-16 ENCOUNTER — Encounter: Payer: Self-pay | Admitting: Gynecologic Oncology

## 2013-03-17 ENCOUNTER — Ambulatory Visit
Admission: RE | Admit: 2013-03-17 | Discharge: 2013-03-17 | Disposition: A | Discharge status: Home / Self Care | Payer: Medicaid Other | Source: Ambulatory Visit | Attending: Radiation Oncology | Admitting: Radiation Oncology

## 2013-03-17 DIAGNOSIS — Z79899 Other long term (current) drug therapy: Secondary | ICD-10-CM | POA: Insufficient documentation

## 2013-03-17 DIAGNOSIS — R5381 Other malaise: Secondary | ICD-10-CM | POA: Insufficient documentation

## 2013-03-17 DIAGNOSIS — Z510 Encounter for antineoplastic radiation therapy: Secondary | ICD-10-CM | POA: Insufficient documentation

## 2013-03-17 DIAGNOSIS — R197 Diarrhea, unspecified: Secondary | ICD-10-CM | POA: Insufficient documentation

## 2013-03-17 DIAGNOSIS — F411 Generalized anxiety disorder: Secondary | ICD-10-CM | POA: Insufficient documentation

## 2013-03-17 DIAGNOSIS — C541 Malignant neoplasm of endometrium: Secondary | ICD-10-CM

## 2013-03-17 DIAGNOSIS — C549 Malignant neoplasm of corpus uteri, unspecified: Secondary | ICD-10-CM | POA: Insufficient documentation

## 2013-03-17 MED ORDER — SODIUM CHLORIDE 0.9 % IJ SOLN: 10.0000 mL | Freq: Once | INTRAMUSCULAR | Status: DC

## 2013-03-17 MED ORDER — SODIUM CHLORIDE 0.9 % IJ SOLN
10.0000 mL | Freq: Once | INTRAMUSCULAR | Status: AC
  Administered 2013-03-17: 10 mL via INTRAVENOUS

## 2013-03-17 NOTE — Progress Notes (Signed)
Patient presented to the clinic today unaccompanied for nurse evaluation and IV start for simulation. Patient alert and oriented to person, place, and time. No distress noted. Steady gait noted. Pleasant affect noted. Patient denies pain at this time. Patient reports that she continues to be sore in her perineal area related to recent pelvic exam. Attempted to start an IV twice. Myriam Jacobson, RN was able to start an IV in the right forearm on the second attempt. Patient tolerated all IV attempts well. Patient escorted to CT/SIM.

## 2013-03-18 ENCOUNTER — Telehealth: Payer: Self-pay | Admitting: Radiation Oncology

## 2013-03-18 NOTE — Telephone Encounter (Signed)
Returned patient's call. Patient reports, "I had a bad night last night." Patient went on to explain that in the evening following the simulation she had several loose bowel movements, flatus and abdominal cramping. Explained to the patient these were normal reactions following rectal contrast. Encouraged patient to rest and hydrate. Patient verbalized understanding. Encouraged patient to contact staff with future needs or concerns. Patient verbalized understanding of all reviewed.

## 2013-03-24 NOTE — Progress Notes (Signed)
  Radiation Oncology         (336) [phone] ________________________________  Name: Deborah [name] MRN: [id number]  Date: 03/17/2013  DOB: 1966-09-12  SIMULATION AND TREATMENT PLANNING NOTE  DIAGNOSIS:  Stage III-C1 grade 2 endometrial adenocarcinoma   NARRATIVE:  The patient was brought to the CT Simulation planning suite.  Identity was confirmed.  All relevant records and images related to the planned course of therapy were reviewed.  The patient freely provided informed written consent to proceed with treatment after reviewing the details related to the planned course of therapy. The consent form was witnessed and verified by the simulation staff.  Then, the patient was set-up in a stable reproducible  supine position for radiation therapy.  CT images were obtained.  Surface markings were placed.  The CT images were loaded into the planning software.  Then the target and avoidance structures were contoured.  Treatment planning then occurred.  The radiation prescription was entered and confirmed.  Then, I designed and supervised the construction of a total of 1 medically necessary complex treatment devices.  I have requested : Intensity Modulated Radiotherapy (IMRT) is medically necessary for this case for the following reason:  Small bowel sparing..  I have ordered:dose calc.  PLAN:  The patient will receive 45 Gy in 25 fractions, with consideration for a boost to the vaginal cuff area.  ________________________________  -----------------------------------  Billie Lade, PhD, MD

## 2013-03-31 ENCOUNTER — Telehealth: Payer: Self-pay | Admitting: *Deleted

## 2013-03-31 ENCOUNTER — Encounter (HOSPITAL_COMMUNITY): Payer: Self-pay | Admitting: Emergency Medicine

## 2013-03-31 ENCOUNTER — Emergency Department (HOSPITAL_COMMUNITY): Payer: Medicaid Other

## 2013-03-31 ENCOUNTER — Emergency Department (HOSPITAL_COMMUNITY)
Admission: EM | Admit: 2013-03-31 | Discharge: 2013-03-31 | Disposition: A | Discharge status: Home / Self Care | Payer: Medicaid Other | Attending: Emergency Medicine | Admitting: Emergency Medicine

## 2013-03-31 DIAGNOSIS — Z79899 Other long term (current) drug therapy: Secondary | ICD-10-CM | POA: Insufficient documentation

## 2013-03-31 DIAGNOSIS — M549 Dorsalgia, unspecified: Secondary | ICD-10-CM

## 2013-03-31 DIAGNOSIS — E669 Obesity, unspecified: Secondary | ICD-10-CM | POA: Insufficient documentation

## 2013-03-31 DIAGNOSIS — Q899 Congenital malformation, unspecified: Secondary | ICD-10-CM | POA: Insufficient documentation

## 2013-03-31 DIAGNOSIS — F329 Major depressive disorder, single episode, unspecified: Secondary | ICD-10-CM | POA: Insufficient documentation

## 2013-03-31 DIAGNOSIS — M545 Low back pain, unspecified: Secondary | ICD-10-CM | POA: Insufficient documentation

## 2013-03-31 DIAGNOSIS — R609 Edema, unspecified: Secondary | ICD-10-CM | POA: Insufficient documentation

## 2013-03-31 DIAGNOSIS — Z9221 Personal history of antineoplastic chemotherapy: Secondary | ICD-10-CM | POA: Insufficient documentation

## 2013-03-31 DIAGNOSIS — F3289 Other specified depressive episodes: Secondary | ICD-10-CM | POA: Insufficient documentation

## 2013-03-31 DIAGNOSIS — M25559 Pain in unspecified hip: Secondary | ICD-10-CM | POA: Insufficient documentation

## 2013-03-31 DIAGNOSIS — M1710 Unilateral primary osteoarthritis, unspecified knee: Secondary | ICD-10-CM | POA: Insufficient documentation

## 2013-03-31 DIAGNOSIS — F411 Generalized anxiety disorder: Secondary | ICD-10-CM | POA: Insufficient documentation

## 2013-03-31 DIAGNOSIS — E785 Hyperlipidemia, unspecified: Secondary | ICD-10-CM | POA: Insufficient documentation

## 2013-03-31 DIAGNOSIS — D649 Anemia, unspecified: Secondary | ICD-10-CM | POA: Insufficient documentation

## 2013-03-31 DIAGNOSIS — Z7982 Long term (current) use of aspirin: Secondary | ICD-10-CM | POA: Insufficient documentation

## 2013-03-31 DIAGNOSIS — C549 Malignant neoplasm of corpus uteri, unspecified: Secondary | ICD-10-CM | POA: Insufficient documentation

## 2013-03-31 MED ORDER — DIAZEPAM 5 MG PO TABS: ORAL_TABLET | ORAL | Status: DC

## 2013-03-31 MED ORDER — ONDANSETRON 8 MG PO TBDP
8.0000 mg | ORAL_TABLET | Freq: Once | ORAL | Status: AC
  Administered 2013-03-31: 8 mg via ORAL
  Filled 2013-03-31: qty 1

## 2013-03-31 MED ORDER — HYDROMORPHONE HCL PF 2 MG/ML IJ SOLN
2.0000 mg | Freq: Once | INTRAMUSCULAR | Status: AC
  Administered 2013-03-31: 2 mg via INTRAMUSCULAR
  Filled 2013-03-31: qty 1

## 2013-03-31 MED ORDER — HYDROCODONE-ACETAMINOPHEN 5-325 MG PO TABS: ORAL_TABLET | ORAL | Status: DC

## 2013-03-31 NOTE — ED Notes (Signed)
Bed:WA16<BR> Expected date:<BR> Expected time:<BR> Means of arrival:<BR> Comments:<BR> ems 

## 2013-03-31 NOTE — ED Provider Notes (Signed)
History     CSN: [id number]  Arrival date & time 03/31/13  1234   First MD Initiated Contact with Patient 03/31/13 1238      Chief Complaint  Patient presents with  . Back Pain    (Consider location/radiation/quality/duration/timing/severity/associated sxs/prior treatment) HPI Comments: Patient with history of endometrial cancer, status post chemotherapy -- presents with complaint of intermittent left lower back and hip pain that has been ongoing for approximately 3 weeks. Pain became acutely worse this morning with no new injury. Patient states that she is having trouble standing up from a sitting position. Other than medical history, patient has no red flag signs and symptoms of lower back pain. No treatments prior to arrival. Onset of symptoms acute. Course is constant. Nothing makes symptoms better.  Patient is a 47 y.o. female presenting with back pain. The history is provided by the patient and medical records.  Back Pain Associated symptoms: no dysuria, no fever, no numbness, no pelvic pain and no weakness     Past Medical History  Diagnosis Date  . Congenital birth defect     Right hand  . Anemia   . Arthritis     knees  . Endometrial cancer   . Anxiety   . Swelling     ANKLES - TAKES LASIX  . Depression   . HLD (hyperlipidemia)     borderline  . Obesity     Past Surgical History  Procedure Laterality Date  . Hiatal hernia repair    . Repair vaginal cuff  10/07/2012    Procedure: REPAIR VAGINAL CUFF;  Surgeon: Laurette Schimke, MD PHD;  Location: [facility];  Service: Gynecology;;  . Robotic assisted total hysterectomy with bilateral salpingo oopherectomy  10/07/2012    Procedure: ROBOTIC ASSISTED TOTAL HYSTERECTOMY WITH BILATERAL SALPINGO OOPHORECTOMY;  Surgeon: Laurette Schimke, MD PHD;  Location: [facility];  Service: Gynecology;  Laterality: Bilateral;  . Abdominal hysterectomy  10/2011    complete    Family History  Problem Relation Age of Onset  . Pancreatic  cancer Maternal Grandfather     or liver cancer  . Hypertension Maternal Grandfather   . Cancer Maternal Grandfather     pancreatic  . Hypertension Maternal Grandmother   . Diabetes Mother   . Colon polyps Maternal Aunt   . Cancer Maternal Aunt     lung  . Colon cancer Cousin   . Cancer Cousin     lung  . Lung cancer Maternal Aunt     History  Substance Use Topics  . Smoking status: Never Smoker   . Smokeless tobacco: Never Used  . Alcohol Use: Yes     Comment: rare    OB History   Grav Para Term Preterm Abortions TAB SAB Ect Mult Living   2 1 0 1 1 0 1 0 0 1       Review of Systems  Constitutional: Negative for fever and unexpected weight change.  Gastrointestinal: Negative for constipation.       Negative for fecal incontinence.   Genitourinary: Negative for dysuria, hematuria, flank pain, vaginal bleeding, vaginal discharge and pelvic pain.       Negative for urinary incontinence or retention.  Musculoskeletal: Positive for back pain.  Neurological: Negative for weakness and numbness.       Denies saddle paresthesias.    Allergies  Review of patient's allergies indicates no known allergies.  Home Medications   Current Outpatient Rx  Name  Route  Sig  Dispense  Refill  .  aspirin 81 MG tablet   Oral   Take 81 mg by mouth every morning.          . bifidobacterium infantis (ALIGN) capsule   Oral   Take 1 capsule by mouth daily as needed.         . ferrous sulfate 325 (65 FE) MG tablet      325 mg every evening. Takes twice a week         . furosemide (LASIX) 20 MG tablet   Oral   Take 1 tablet (20 mg total) by mouth every morning.   30 tablet   3   . lactase (LACTAID) 3000 UNITS tablet   Oral   Take 1 tablet by mouth 3 (three) times daily with meals as needed.         . loratadine (CLARITIN) 10 MG tablet   Oral   Take 10 mg by mouth daily.         . ondansetron (ZOFRAN) 8 MG tablet   Oral   Take 1-2 tablets (8-16 mg total) by mouth  every 8 (eight) hours as needed for nausea (Will not make you drowsy).   30 tablet   1   . OVER THE COUNTER MEDICATION      Allergy Plus Sinus Headache- acetaminophen 325, diphenhydramine 12.5, phenylephrine 5 mg         . OVER THE COUNTER MEDICATION      Medi-phenyl 5 mg         . polyethylene glycol powder (MIRALAX) powder   Oral   Take 17 g by mouth 2 (two) times daily as needed.   255 g   2   . potassium chloride SA (K-DUR,KLOR-CON) 20 MEQ tablet   Oral   Take 1 tablet (20 mEq total) by mouth daily.   30 tablet   1     Pt. has some tabs left from previous prescription  ...   . PRESCRIPTION MEDICATION      PT STATES SHE HAD HER LAST TREATMENT ON 02-18-13 AND AT THIS TIME PT IS NOT SCHEDULED FOR ANOTHER TREATMENT PENDING HER RADIATION AND CT SCAN, SHE IS FOLLOWED BY DR LIVESAY.         [name] prochlorperazine (COMPAZINE) 10 MG tablet   Oral   Take 1 tablet (10 mg total) by mouth every 6 (six) hours as needed.   20 tablet   0   . traMADol (ULTRAM) 50 MG tablet   Oral   Take 1 tablet (50 mg total) by mouth every 8 (eight) hours as needed for pain.   30 tablet   2   . dexamethasone (DECADRON) 4 MG tablet      Take 5 tablets with food 12 hrs. and 6 hrs. Prior to Taxol chemotherapy.   20 tablet   1   . EXPIRED: sertraline (ZOLOFT) 100 MG tablet   Oral   Take 100 mg by mouth every evening.           BP 123/70  Pulse 87  Temp(Src) 98.7 F (37.1 C) (Oral)  Resp 16  SpO2 97%  LMP 09/20/2012  Physical Exam  Nursing note and vitals reviewed. Constitutional: She appears well-developed and well-nourished.  HENT:  Head: Normocephalic and atraumatic.  Eyes: Conjunctivae are normal.  Neck: Normal range of motion. Neck supple.  Pulmonary/Chest: Effort normal.  Abdominal: Soft. There is no tenderness. There is no CVA tenderness.  Musculoskeletal: Normal range of motion.  Cervical back: Normal.       Thoracic back: Normal.       Lumbar back: She  exhibits tenderness. She exhibits normal range of motion and no bony tenderness.       Back:  No step-off noted with palpation of spine.   Neurological: She is alert. She has normal strength and normal reflexes. No sensory deficit.  5/5 strength in entire lower extremities bilaterally. No sensation deficit.   Skin: Skin is warm and dry. No rash noted.  Psychiatric: She has a normal mood and affect.    ED Course  Procedures (including critical care time)  Labs Reviewed - No data to display Dg Lumbar Spine Complete  03/31/2013  *RADIOLOGY REPORT*  Clinical Data: Back pain, endometrial CA  LUMBAR SPINE - COMPLETE 4+ VIEW  Comparison: None.  Findings: Five views of the lumbar spine submitted.  Moderate anterior spurring noted at L3-L4 level. Moderate anterior spurring and mild disc space flattening at L5 S1 level.  Mild anterior spurring at L1 - L2 level.  Alignment and vertebral heights are preserved.  IMPRESSION: No acute fracture or subluxation.  Degenerative changes as described above.   Original Report Authenticated By: Natasha Mead, M.D.    Dg Hip Complete Left  03/31/2013  *RADIOLOGY REPORT*  Clinical Data: Back pain, hip pain  LEFT HIP - COMPLETE 2+ VIEW  Comparison: None.  Findings: Three views of the left hip submitted.  No acute fracture or subluxation.  Pelvic phleboliths are noted.  IMPRESSION: No acute fracture or subluxation.   Original Report Authenticated By: Natasha Mead, M.D.      1. Back pain     Patient seen and examined. Work-up initiated. Medications ordered.   Vital signs reviewed and are as follows: Filed Vitals:   03/31/13 1243  BP: 123/70  Pulse: 87  Temp: 98.7 F (37.1 C)  Resp: 16   Pt d/w Dr. Denton Lank who as seen. X-rays neg. Will ambuate.    6:08 PM Per nurse patient has ambulated well without assistance. There was a delay in this because patient felt dizzy after dilaudid given earlier.   No red flag s/s of low back pain. Patient was counseled on back pain  precautions and told to do activity as tolerated but do not lift, push, or pull heavy objects more than 10 pounds for the next week.  Patient counseled to use ice or heat on back for no longer than 15 minutes every hour.   Patient prescribed muscle relaxer and counseled on proper use of muscle relaxant medication.    Patient prescribed narcotic pain medicine and counseled on proper use of narcotic pain medications. Counseled not to combine this medication with others containing tylenol.   Urged patient not to drink alcohol, drive, or perform any other activities that requires focus while taking either of these medications.  Patient urged to follow-up with PCP if pain does not improve with treatment and rest or if pain becomes recurrent. Urged to return with worsening severe pain, loss of bowel or bladder control, trouble walking.   The patient verbalizes understanding and agrees with the plan.  MDM  Patient with worsening back pain. Given h/o CA, x-rays were ordered and are negative. Patient is ambulatory. No neurological deficits. No other warning symptoms of back pain including: loss of bowel or bladder control, night sweats, waking from sleep with back pain, unexplained fevers or weight loss, h/o cancer, IVDU, recent trauma. No concern for cauda equina, epidural abscess, or other serious  cause of back pain. Conservative measures such as rest, ice/heat and pain medicine indicated with PCP follow-up if no improvement with conservative management.         Renne Crigler, PA-C 03/31/13 1812

## 2013-03-31 NOTE — ED Notes (Signed)
Patient stated that she is unable to walk at this time because of the shot the nurse gave her made her feel funny.  I made the nurse aware.

## 2013-03-31 NOTE — ED Notes (Signed)
Per E[facility]-pt c/o of left lower lumbar back pain that started at 730am. No trauma. Pain 8/10. Hx Cancer.

## 2013-03-31 NOTE — ED Notes (Signed)
Pt asked to ambulate in hallway. Pt states she will use the call bell when ready to walk. Pt informed that she cannot be discharged until she walks.

## 2013-03-31 NOTE — Telephone Encounter (Signed)
Returned call from pt who states "my middle back has been hurting ever since I had that simulation for my treatment." She states this morning she went to the bathroom and "could hardly get herself off the toilet". She states "the pain is in her left buttock at her hip and goes into the middle of her butt". She states "it feels like something has torn apart". Pt denies numbness, tingling, bladder or bowel problems. She has not taken any pain meds for this pain. She states she is going to come to [facility] ED because "the pain is so bad". Informed her will give this information to Dr Roselind Messier. Pt scheduled to begin radiation on 04/06/13.

## 2013-04-01 ENCOUNTER — Ambulatory Visit: Payer: Medicaid Other | Admitting: Radiation Oncology

## 2013-04-01 ENCOUNTER — Telehealth: Payer: Self-pay | Admitting: Family Medicine

## 2013-04-01 NOTE — ED Provider Notes (Signed)
Medical screening examination/treatment/procedure(s) were conducted as a shared visit with non-physician practitioner(s) and myself.  I personally evaluated the patient during the encounter Pt with hx endomet ca, c/o low back pain, worse w bending, turning, movements. ?strain. No direct trauma or fall. Spine nt. No cva tenderness.  Suzi Roots, MD 04/01/13 [phone]

## 2013-04-01 NOTE — Telephone Encounter (Signed)
Patient is calling because she went to the ER due to back pain and it was recommended for her to see an Orthopaedist so she needs a referral.  She wants to address this before she starts Radiologist.

## 2013-04-01 NOTE — Telephone Encounter (Signed)
I have reviewed the ER record, and the ER provider actually put in his assessment and plan that she should follow up with her PCP- which would be me.  I do not see any indication based on the physical exam that she needs to see orthopedics for this.  Please ask her to make an appointment here for follow up.

## 2013-04-01 NOTE — Telephone Encounter (Signed)
Pt informed and agreeable.  Appt made. Brandii Lakey, Maryjo Rochester

## 2013-04-02 ENCOUNTER — Ambulatory Visit: Payer: Medicaid Other

## 2013-04-03 ENCOUNTER — Encounter: Payer: Self-pay | Admitting: Family Medicine

## 2013-04-03 ENCOUNTER — Ambulatory Visit: Payer: Medicaid Other

## 2013-04-03 ENCOUNTER — Ambulatory Visit (INDEPENDENT_AMBULATORY_CARE_PROVIDER_SITE_OTHER): Payer: Medicaid Other | Admitting: Family Medicine

## 2013-04-03 ENCOUNTER — Telehealth: Payer: Self-pay | Admitting: *Deleted

## 2013-04-03 ENCOUNTER — Ambulatory Visit: Payer: Medicaid Other | Admitting: Cardiology

## 2013-04-03 VITALS — BP 118/77 | HR 91 | Temp 98.5°F | Ht 66.0 in | Wt 284.0 lb

## 2013-04-03 DIAGNOSIS — M545 Low back pain: Secondary | ICD-10-CM

## 2013-04-03 MED ORDER — DICLOFENAC SODIUM 75 MG PO TBEC: 75.0000 mg | DELAYED_RELEASE_TABLET | Freq: Two times a day (BID) | ORAL | Status: DC

## 2013-04-03 NOTE — Telephone Encounter (Signed)
Returned pt's call. She states her "back is still hurting". She saw her PCP today, has been referred to sports orthopedic dr, appt 04/13/13. Pt is inquiring about starting her radiation tx on 04/06/13 due to ongoing back pain. Discussed taking pain med 30 min prior to radiation appt; pt has driver that day. Pt agreed to take pain med; states she will try to come unless her condition worsens over weekend. Pt also inquiring about a pillow under her back during tx; informed her that it may not be possible due to the necessity for her to lie in same position she was simmed, and to lie flat and still during treatment. Pt verbalized understanding.

## 2013-04-03 NOTE — Assessment & Plan Note (Signed)
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  Rx for diclofenac to schedule, advised to try ice or heat therapy, and stretches/exercises.  I will refer her to Dr. Katrinka Blazing at [facility] to see if he thinks SI joint malalignment is a factor and if manipulation would be beneficial.  Pt agreeable.

## 2013-04-03 NOTE — Progress Notes (Signed)
  Subjective:    Patient ID: Deborah [name], female    DOB: 1966-05-20, 47 y.o.   MRN: [id number]  HPI  Deborah [name] comes in for follow up of back pain after an ER visit.  She says she had severe back pain and called EMS.  She says the ER gave her a shot of dilaudid and it knocked her out but helped the pain.  She felt a little better after that.  However her pain has started getting worse again.  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   She describes a feeling that low in her back things are out of place and feels like she is falling apart.  She has aching, severe pains, but denies burning or shooting pains, denies numbness, tingling, weakness in her legs, denies incontinence.  No falls or trauma.   Review of Systems Negative except HPI    Objective:   Physical Exam  BP 118/77  Pulse 91  Temp(Src) 98.5 F (36.9 C) (Oral)  Ht 5\' 6"  (1.676 m)  Wt 284 lb (128.822 kg)  BMI 45.86 kg/m2  LMP 09/20/2012 General appearance: alert, cooperative, no distress and morbidly obese  Back: Normal Curvature, no deformities or CVA tenderness Paraspinal Tenderness: bilaterally Tenderness over L SI joint LE Strength 5/5 LE Sensation: in tact LE Reflexes 2+ and symmetric      Assessment & Plan:

## 2013-04-03 NOTE — Patient Instructions (Signed)
I am sorry your back is hurting so badly.  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  I am referring you to see Dr[facility] Katrinka [facility] to see if he thinks an adjustment would help you feel better.  In the mean time, please take the diclofenac 1 pill with food twice daily, try the stretches below, and try ice and heat on your back.   Low Back Sprain with Rehab  A sprain is an injury in which a ligament is torn. The ligaments of the lower back are vulnerable to sprains. However, they are strong and require great force to be injured. These ligaments are important for stabilizing the spinal column. Sprains are classified into three categories. Grade 1 sprains cause pain, but the tendon is not lengthened. Grade 2 sprains include a lengthened ligament, due to the ligament being stretched or partially ruptured. With grade 2 sprains there is still function, although the function may be decreased. Grade 3 sprains involve a complete tear of the tendon or muscle, and function is usually impaired. SYMPTOMS   Severe pain in the lower back.  Sometimes, a feeling of a "pop," "snap," or tear, at the time of injury.  Tenderness and sometimes swelling at the injury site.  Uncommonly, bruising (contusion) within 48 hours of injury.  Muscle spasms in the back. CAUSES  Low back sprains occur when a force is placed on the ligaments that is greater than they can handle. Common causes of injury include:  Performing a stressful act while off-balance.  Repetitive stressful activities that involve movement of the lower back.  Direct hit (trauma) to the lower back. RISK INCREASES WITH:  Contact sports (football, wrestling).  Collisions (major skiing accidents).  Sports that require throwing or lifting (baseball, weightlifting).  Sports involving twisting of the spine (gymnastics, diving, tennis, golf).  Poor strength and  flexibility.  Inadequate protection.  Previous back injury or surgery (especially fusion). PREVENTION  Wear properly fitted and padded protective equipment.  Warm up and stretch properly before activity.  Allow for adequate recovery between workouts.  Maintain physical fitness:  Strength, flexibility, and endurance.  Cardiovascular fitness.  Maintain a healthy body weight. PROGNOSIS  If treated properly, low back sprains usually heal with non-surgical treatment. The length of time for healing depends on the severity of the injury.  RELATED COMPLICATIONS   Recurring symptoms, resulting in a chronic problem.  Chronic inflammation and pain in the low back.  Delayed healing or resolution of symptoms, especially if activity is resumed too soon.  Prolonged impairment.  Unstable or arthritic joints of the low back. TREATMENT  Treatment first involves the use of ice and medicine, to reduce pain and inflammation. The use of strengthening and stretching exercises may help reduce pain with activity. These exercises may be performed at home or with a therapist. Severe injuries may require referral to a therapist for further evaluation and treatment, such as ultrasound. Your caregiver may advise that you wear a back brace or corset, to help reduce pain and discomfort. Often, prolonged bed rest results in greater harm then benefit. Corticosteroid injections may be recommended. However, these should be reserved for the most serious cases. It is important to avoid using your back when lifting objects. At night, sleep on your back on a firm mattress, with a pillow placed under your knees. If non-surgical treatment is unsuccessful, surgery may be needed.  MEDICATION  If pain medicine is needed, nonsteroidal anti-inflammatory medicines (aspirin and ibuprofen), or other minor pain relievers (acetaminophen), are often advised.  Do not take pain medicine for 7 days before surgery.  Prescription  pain relievers may be given, if your caregiver thinks they are needed. Use only as directed and only as much as you need.  Ointments applied to the skin may be helpful.  Corticosteroid injections may be given by your caregiver. These injections should be reserved for the most serious cases, because they may only be given a certain number of times. HEAT AND COLD  Cold treatment (icing) should be applied for 10 to 15 minutes every 2 to 3 hours for inflammation and pain, and immediately after activity that aggravates your symptoms. Use ice packs or an ice massage.  Heat treatment may be used before performing stretching and strengthening activities prescribed by your caregiver, physical therapist, or athletic trainer. Use a heat pack or a warm water soak. SEEK MEDICAL CARE IF:   Symptoms get worse or do not improve in 2 to 4 weeks, despite treatment.  You develop numbness or weakness in either leg.  You lose bowel or bladder function.  Any of the following occur after surgery: fever, increased pain, swelling, redness, drainage of fluids, or bleeding in the affected area.  New, unexplained symptoms develop. (Drugs used in treatment may produce side effects.) EXERCISES  RANGE OF MOTION (ROM) AND STRETCHING EXERCISES - Low Back Sprain Most people with lower back pain will find that their symptoms get worse with excessive bending forward (flexion) or arching at the lower back (extension). The exercises that will help resolve your symptoms will focus on the opposite motion.  Your physician, physical therapist or athletic trainer will help you determine which exercises will be most helpful to resolve your lower back pain. Do not complete any exercises without first consulting with your caregiver. Discontinue any exercises which make your symptoms worse, until you speak to your caregiver. If you have pain, numbness or tingling which travels down into your buttocks, leg or foot, the goal of the therapy  is for these symptoms to move closer to your back and eventually resolve. Sometimes, these leg symptoms will get better, but your lower back pain may worsen. This is often an indication of progress in your rehabilitation. Be very alert to any changes in your symptoms and the activities in which you participated in the 24 hours prior to the change. Sharing this information with your caregiver will allow him or her to most efficiently treat your condition. These exercises may help you when beginning to rehabilitate your injury. Your symptoms may resolve with or without further involvement from your physician, physical therapist or athletic trainer. While completing these exercises, remember:   Restoring tissue flexibility helps normal motion to return to the joints. This allows healthier, less painful movement and activity.  An effective stretch should be held for at least 30 seconds.  A stretch should never be painful. You should only feel a gentle lengthening or release in the stretched tissue. FLEXION RANGE OF MOTION AND STRETCHING EXERCISES: STRETCH  Flexion, Single Knee to Chest   Lie on a firm bed or floor with both legs extended in front of you.  Keeping one leg in contact with the floor, bring your opposite knee to your chest. Hold your leg in place by either grabbing behind your thigh or at your knee.  Pull until you feel a gentle stretch in your low back. Hold __________ seconds.  Slowly release your grasp and repeat the exercise with the opposite side. Repeat __________ times. Complete this exercise __________ times per day.  STRETCH  Flexion, Double Knee to Chest  Lie on a firm bed or floor with both legs extended in front of you.  Keeping one leg in contact with the floor, bring your opposite knee to your chest.  Tense your stomach muscles to support your back and then lift your other knee to your chest. Hold your legs in place by either grabbing behind your thighs or at your  knees.  Pull both knees toward your chest until you feel a gentle stretch in your low back. Hold __________ seconds.  Tense your stomach muscles and slowly return one leg at a time to the floor. Repeat __________ times. Complete this exercise __________ times per day.  STRETCH  Low Trunk Rotation  Lie on a firm bed or floor. Keeping your legs in front of you, bend your knees so they are both pointed toward the ceiling and your feet are flat on the floor.  Extend your arms out to the side. This will stabilize your upper body by keeping your shoulders in contact with the floor.  Gently and slowly drop both knees together to one side until you feel a gentle stretch in your low back. Hold for __________ seconds.  Tense your stomach muscles to support your lower back as you bring your knees back to the starting position. Repeat the exercise to the other side. Repeat __________ times. Complete this exercise __________ times per day  EXTENSION RANGE OF MOTION AND FLEXIBILITY EXERCISES: STRETCH  Extension, Prone on Elbows   Lie on your stomach on the floor, a bed will be too soft. Place your palms about shoulder width apart and at the height of your head.  Place your elbows under your shoulders. If this is too painful, stack pillows under your chest.  Allow your body to relax so that your hips drop lower and make contact more completely with the floor.  Hold this position for __________ seconds.  Slowly return to lying flat on the floor. Repeat __________ times. Complete this exercise __________ times per day.  RANGE OF MOTION  Extension, Prone Press Ups  Lie on your stomach on the floor, a bed will be too soft. Place your palms about shoulder width apart and at the height of your head.  Keeping your back as relaxed as possible, slowly straighten your elbows while keeping your hips on the floor. You may adjust the placement of your hands to maximize your comfort. As you gain motion, your  hands will come more underneath your shoulders.  Hold this position __________ seconds.  Slowly return to lying flat on the floor. Repeat __________ times. Complete this exercise __________ times per day.  RANGE OF MOTION- Quadruped, Neutral Spine   Assume a hands and knees position on a firm surface. Keep your hands under your shoulders and your knees under your hips. You may place padding under your knees for comfort.  Drop your head and point your tailbone toward the ground below you. This will round out your lower back like an angry cat. Hold this position for __________ seconds.  Slowly lift your head and release your tail bone so that your back sags into a large arch, like an old horse.  Hold this position for __________ seconds.  Repeat this until you feel limber in your low back.  Now, find your "sweet spot." This will be the  most comfortable position somewhere between the two previous positions. This is your neutral spine. Once you have found this position, tense your stomach muscles to support your low back.  Hold this position for __________ seconds. Repeat __________ times. Complete this exercise __________ times per day.  STRENGTHENING EXERCISES - Low Back Sprain These exercises may help you when beginning to rehabilitate your injury. These exercises should be done near your "sweet spot." This is the neutral, low-back arch, somewhere between fully rounded and fully arched, that is your least painful position. When performed in this safe range of motion, these exercises can be used for people who have either a flexion or extension based injury. These exercises may resolve your symptoms with or without further involvement from your physician, physical therapist or athletic trainer. While completing these exercises, remember:   Muscles can gain both the endurance and the strength needed for everyday activities through controlled exercises.  Complete these exercises as instructed  by your physician, physical therapist or athletic trainer. Increase the resistance and repetitions only as guided.  You may experience muscle soreness or fatigue, but the pain or discomfort you are trying to eliminate should never worsen during these exercises. If this pain does worsen, stop and make certain you are following the directions exactly. If the pain is still present after adjustments, discontinue the exercise until you can discuss the trouble with your caregiver. STRENGTHENING Deep Abdominals, Pelvic Tilt   Lie on a firm bed or floor. Keeping your legs in front of you, bend your knees so they are both pointed toward the ceiling and your feet are flat on the floor.  Tense your lower abdominal muscles to press your low back into the floor. This motion will rotate your pelvis so that your tail bone is scooping upwards rather than pointing at your feet or into the floor. With a gentle tension and even breathing, hold this position for __________ seconds. Repeat __________ times. Complete this exercise __________ times per day.  STRENGTHENING  Abdominals, Crunches   Lie on a firm bed or floor. Keeping your legs in front of you, bend your knees so they are both pointed toward the ceiling and your feet are flat on the floor. Cross your arms over your chest.  Slightly tip your chin down without bending your neck.  Tense your abdominals and slowly lift your trunk high enough to just clear your shoulder blades. Lifting higher can put excessive stress on the lower back and does not further strengthen your abdominal muscles.  Control your return to the starting position. Repeat __________ times. Complete this exercise __________ times per day.  STRENGTHENING  Quadruped, Opposite UE/LE Lift   Assume a hands and knees position on a firm surface. Keep your hands under your shoulders and your knees under your hips. You may place padding under your knees for comfort.  Find your neutral spine and  gently tense your abdominal muscles so that you can maintain this position. Your shoulders and hips should form a rectangle that is parallel with the floor and is not twisted.  Keeping your trunk steady, lift your right hand no higher than your shoulder and then your left leg no higher than your hip. Make sure you are not holding your breath. Hold this position for __________ seconds.  Continuing to keep your abdominal muscles tense and your back steady, slowly return to your starting position. Repeat with the opposite arm and leg. Repeat __________ times. Complete this exercise __________ times per day.  STRENGTHENING  Abdominals and Quadriceps, Straight Leg Raise   Lie on a firm bed or floor with both legs extended in front of you.  Keeping one leg in contact with the floor, bend the other knee so that your foot can rest flat on the floor.  Find your neutral spine, and tense your abdominal muscles to maintain your spinal position throughout the exercise.  Slowly lift your straight leg off the floor about 6 inches for a count of 15, making sure to not hold your breath.  Still keeping your neutral spine, slowly lower your leg all the way to the floor. Repeat this exercise with each leg __________ times. Complete this exercise __________ times per day. POSTURE AND BODY MECHANICS CONSIDERATIONS - Low Back Sprain Keeping correct posture when sitting, standing or completing your activities will reduce the stress put on different body tissues, allowing injured tissues a chance to heal and limiting painful experiences. The following are general guidelines for improved posture. Your physician or physical therapist will provide you with any instructions specific to your needs. While reading these guidelines, remember:  The exercises prescribed by your provider will help you have the flexibility and strength to maintain correct postures.  The correct posture provides the best environment for your  joints to work. All of your joints have less wear and tear when properly supported by a spine with good posture. This means you will experience a healthier, less painful body.  Correct posture must be practiced with all of your activities, especially prolonged sitting and standing. Correct posture is as important when doing repetitive low-stress activities (typing) as it is when doing a single heavy-load activity (lifting). RESTING POSITIONS Consider which positions are most painful for you when choosing a resting position. If you have pain with flexion-based activities (sitting, bending, stooping, squatting), choose a position that allows you to rest in a less flexed posture. You would want to avoid curling into a fetal position on your side. If your pain worsens with extension-based activities (prolonged standing, working overhead), avoid resting in an extended position such as sleeping on your stomach. Most people will find more comfort when they rest with their spine in a more neutral position, neither too rounded nor too arched. Lying on a non-sagging bed on your side with a pillow between your knees, or on your back with a pillow under your knees will often provide some relief. Keep in mind, being in any one position for a prolonged period of time, no matter how correct your posture, can still lead to stiffness. PROPER SITTING POSTURE In order to minimize stress and discomfort on your spine, you must sit with correct posture. Sitting with good posture should be effortless for a healthy body. Returning to good posture is a gradual process. Many people can work toward this most comfortably by using various supports until they have the flexibility and strength to maintain this posture on their own. When sitting with proper posture, your ears will fall over your shoulders and your shoulders will fall over your hips. You should use the back of the chair to support your upper back. Your lower back will be in  a neutral position, just slightly arched. You may place a small pillow or folded towel at the base of your lower back for  support.  When working at a desk, create an environment that supports good, upright posture. Without extra support, muscles tire, which leads to excessive strain on joints and other tissues. Keep these recommendations in  mind: CHAIR:  A chair should be able to slide under your desk when your back makes contact with the back of the chair. This allows you to work closely.  The chair's height should allow your eyes to be level with the upper part of your monitor and your hands to be slightly lower than your elbows. BODY POSITION  Your feet should make contact with the floor. If this is not possible, use a foot rest.  Keep your ears over your shoulders. This will reduce stress on your neck and low back. INCORRECT SITTING POSTURES  If you are feeling tired and unable to assume a healthy sitting posture, do not slouch or slump. This puts excessive strain on your back tissues, causing more damage and pain. Healthier options include:  Using more support, like a lumbar pillow.  Switching tasks to something that requires you to be upright or walking.  Talking a brief walk.  Lying down to rest in a neutral-spine position. PROLONGED STANDING WHILE SLIGHTLY LEANING FORWARD  When completing a task that requires you to lean forward while standing in one place for a long time, place either foot up on a stationary 2-4 inch high object to help maintain the best posture. When both feet are on the ground, the lower back tends to lose its slight inward curve. If this curve flattens (or becomes too large), then the back and your other joints will experience too much stress, tire more quickly, and can cause pain. CORRECT STANDING POSTURES Proper standing posture should be assumed with all daily activities, even if they only take a few moments, like when brushing your teeth. As in sitting,  your ears should fall over your shoulders and your shoulders should fall over your hips. You should keep a slight tension in your abdominal muscles to brace your spine. Your tailbone should point down to the ground, not behind your body, resulting in an over-extended swayback posture.  INCORRECT STANDING POSTURES  Common incorrect standing postures include a forward head, locked knees and/or an excessive swayback. WALKING Walk with an upright posture. Your ears, shoulders and hips should all line-up. PROLONGED ACTIVITY IN A FLEXED POSITION When completing a task that requires you to bend forward at your waist or lean over a low surface, try to find a way to stabilize 3 out of 4 of your limbs. You can place a hand or elbow on your thigh or rest a knee on the surface you are reaching across. This will provide you more stability, so that your muscles do not tire as quickly. By keeping your knees relaxed, or slightly bent, you will also reduce stress across your lower back. CORRECT LIFTING TECHNIQUES DO :  Assume a wide stance. This will provide you more stability and the opportunity to get as close as possible to the object which you are lifting.  Tense your abdominals to brace your spine. Bend at the knees and hips. Keeping your back locked in a neutral-spine position, lift using your leg muscles. Lift with your legs, keeping your back straight.  Test the weight of unknown objects before attempting to lift them.  Try to keep your elbows locked down at your sides in order get the best strength from your shoulders when carrying an object.  Always ask for help when lifting heavy or awkward objects. INCORRECT LIFTING TECHNIQUES DO NOT:   Lock your knees when lifting, even if it is a small object.  Bend and twist. Pivot at your feet or move  your feet when needing to change directions.  Assume that you can safely pick up even a paperclip without proper posture. Document Released: 11/12/2005  Document Revised: 02/04/2012 Document Reviewed: 02/24/2009 [facility] Patient Information 2013 [place], [place].

## 2013-04-06 ENCOUNTER — Ambulatory Visit: Payer: Medicaid Other

## 2013-04-06 ENCOUNTER — Ambulatory Visit: Payer: Medicaid Other | Admitting: Radiation Oncology

## 2013-04-07 ENCOUNTER — Encounter: Payer: Self-pay | Admitting: Radiation Oncology

## 2013-04-07 ENCOUNTER — Ambulatory Visit
Admission: RE | Admit: 2013-04-07 | Discharge: 2013-04-07 | Disposition: A | Discharge status: Home / Self Care | Payer: Medicaid Other | Source: Ambulatory Visit | Attending: Radiation Oncology | Admitting: Radiation Oncology

## 2013-04-07 ENCOUNTER — Telehealth: Payer: Self-pay | Admitting: *Deleted

## 2013-04-07 VITALS — BP 120/82 | HR 86 | Temp 98.3°F | Resp 20 | Wt 286.4 lb

## 2013-04-07 DIAGNOSIS — C541 Malignant neoplasm of endometrium: Secondary | ICD-10-CM

## 2013-04-07 NOTE — Progress Notes (Signed)
   Department of Radiation Oncology  Phone:  [phone] Fax:        [phone]  Intensity modulated radiation therapy device note  Today the patient began her helical intensity modulated radiation therapy directed at the pelvis area. Patient will be treated with 9 sinogram segments. This constitutes 1 IMRT device.  -----------------------------------  Billie Lade, PhD, MD

## 2013-04-07 NOTE — Telephone Encounter (Signed)
Returned call from pt. She states she is on her way to cancer center but unsure if she'll be able to take treatment today. She states she did take her pain med this morning, but "her back still hurts". Informed pt she will see Dr Roselind Messier today. Pt verbalized understanding.

## 2013-04-07 NOTE — Progress Notes (Signed)
Post sim ed completed w/pt. Gave pt "Radiation and You" booklet w/all pertinent information marked and discussed, re: diarrhea management, fatigue, nausea/vomiting, skin irritation/care, urinary irritation/management, nutrition, pain. All questions answered. Pt verbalized understanding through teachback.

## 2013-04-07 NOTE — Progress Notes (Signed)
[facility]    Radiation Oncology [address] [place]     Deborah [name], M.D. [place], [place] [address]               Deborah [name], M.D., Ph.D. Phone: [phone]      Deborah [name], M.D. Fax: [phone]      Deborah [name], M.D., Ph.D.         Deborah [name], M.D.         Deborah [name], M.D Weekly Treatment Management Note  Name: Deborah [name]     MRN: [id number]        CSN: [id number] Date: 04/07/2013      DOB: 10/18/1966  CC: Deborah Gal, MD         Deborah [name]    Status: Outpatient  Diagnosis: The encounter diagnosis was Endometrial cancer.  Current Dose: 1.8  Current Fraction: 1/25  Planned Dose: 45  Narrative: Deborah [name] was seen today for weekly treatment management. The chart was checked and MVCT  were reviewed. She is tolerating her radiation therapy well thus far.  She continues to have a lot of low back pain and will be seeing an orthopedic surgeon early next week concerning this issue.  Review of patient's allergies indicates no known allergies. Current Outpatient Prescriptions  Medication Sig Dispense Refill  . aspirin 81 MG tablet Take 81 mg by mouth every morning.       . bifidobacterium infantis (ALIGN) capsule Take 1 capsule by mouth daily as needed.      [name] dexamethasone (DECADRON) 4 MG tablet Take 5 tablets with food 12 hrs. and 6 hrs. Prior to Taxol chemotherapy.  20 tablet  1  . diazepam (VALIUM) 5 MG tablet Take 1-2 tablets every 8 hours as needed for muscle spasm  12 tablet  0  . diclofenac (VOLTAREN) 75 MG EC tablet Take 1 tablet (75 mg total) by mouth 2 (two) times daily.  30 tablet  0  . ferrous sulfate 325 (65 FE) MG tablet 325 mg every evening. Takes twice a week      . furosemide (LASIX) 20 MG tablet Take 1 tablet (20 mg total) by mouth every morning.  30 tablet  3  . HYDROcodone-acetaminophen (NORCO/VICODIN) 5-325 MG per tablet Take 1-2 tablets every 6 hours as needed for severe pain  12 tablet  0  . lactase  (LACTAID) 3000 UNITS tablet Take 1 tablet by mouth 3 (three) times daily with meals as needed.      . loratadine (CLARITIN) 10 MG tablet Take 10 mg by mouth daily.      . ondansetron (ZOFRAN) 8 MG tablet Take 1-2 tablets (8-16 mg total) by mouth every 8 (eight) hours as needed for nausea (Will not make you drowsy).  30 tablet  1  . OVER THE COUNTER MEDICATION Allergy Plus Sinus Headache- acetaminophen 325, diphenhydramine 12.5, phenylephrine 5 mg      . OVER THE COUNTER MEDICATION Medi-phenyl 5 mg      . polyethylene glycol powder (MIRALAX) powder Take 17 g by mouth 2 (two) times daily as needed.  255 g  2  . potassium chloride SA (K-DUR,KLOR-CON) 20 MEQ tablet Take 1 tablet (20 mEq total) by mouth daily.  30 tablet  1  . PRESCRIPTION MEDICATION PT STATES SHE HAD HER LAST TREATMENT ON 02-18-13 AND AT THIS TIME PT IS NOT SCHEDULED FOR ANOTHER TREATMENT PENDING HER RADIATION AND CT SCAN, SHE IS FOLLOWED BY DR LIVESAY.      [name]  prochlorperazine (COMPAZINE) 10 MG tablet Take 1 tablet (10 mg total) by mouth every 6 (six) hours as needed.  20 tablet  0  . traMADol (ULTRAM) 50 MG tablet Take 1 tablet (50 mg total) by mouth every 8 (eight) hours as needed for pain.  30 tablet  2  . sertraline (ZOLOFT) 100 MG tablet Take 100 mg by mouth every evening.       No current facility-administered medications for this encounter.   Labs:  Lab Results  Component Value Date   WBC 3.8* 03/12/2013   HGB 12.1 03/12/2013   HCT 36.6 03/12/2013   MCV 89.2 03/12/2013   PLT 221 03/12/2013   Lab Results  Component Value Date   CREATININE 0.9 03/12/2013   BUN 18.3 03/12/2013   NA 138 03/12/2013   K 3.4* 03/12/2013   CL 99 03/12/2013   CO2 27 03/12/2013   Lab Results  Component Value Date   ALT 21 02/25/2013   AST 12 02/25/2013   BILITOT 0.38 02/25/2013    Physical Examination:  weight is 286 lb 6.4 oz (129.91 kg). Her oral temperature is 98.3 F (36.8 C). Her blood pressure is 120/82 and her pulse is 86. Her respiration is  20.    Wt Readings from Last 3 Encounters:  04/07/13 286 lb 6.4 oz (129.91 kg)  04/03/13 284 lb (128.822 kg)  03/17/13 282 lb 8 oz (128.141 kg)     Lungs - Normal respiratory effort, chest expands symmetrically. Lungs are clear to auscultation, no crackles or wheezes.  Heart has regular rhythm and rate  Abdomen is soft and non tender with normal bowel sounds  Assessment:  Patient tolerating treatments well  except for discomfort lying on the treatment table  Plan: Continue treatment per original radiation prescription

## 2013-04-07 NOTE — Progress Notes (Signed)
Pt continues to have low back pain, is seeing sports MD next Monday. She denies other pain, fatigue, loss of appetite, vaginal discharge, urinary or bowel issues.  Post sim ed completed w/pt; charted under pt education appt.

## 2013-04-08 ENCOUNTER — Ambulatory Visit
Admission: RE | Admit: 2013-04-08 | Discharge: 2013-04-08 | Disposition: A | Discharge status: Home / Self Care | Payer: Medicaid Other | Source: Ambulatory Visit | Attending: Radiation Oncology | Admitting: Radiation Oncology

## 2013-04-09 ENCOUNTER — Encounter: Payer: Self-pay | Admitting: Radiation Oncology

## 2013-04-09 ENCOUNTER — Ambulatory Visit
Admission: RE | Admit: 2013-04-09 | Discharge: 2013-04-09 | Disposition: A | Discharge status: Home / Self Care | Payer: Medicaid Other | Source: Ambulatory Visit | Attending: Radiation Oncology | Admitting: Radiation Oncology

## 2013-04-09 ENCOUNTER — Ambulatory Visit: Admission: RE | Admit: 2013-04-09 | Payer: Medicaid Other | Source: Ambulatory Visit

## 2013-04-09 VITALS — BP 130/81 | HR 90 | Temp 98.4°F | Resp 20 | Wt 282.3 lb

## 2013-04-09 DIAGNOSIS — C541 Malignant neoplasm of endometrium: Secondary | ICD-10-CM

## 2013-04-09 NOTE — Progress Notes (Signed)
Pt brought to nursing, unable to complete radiation treament today due to anxiety.  Pt denies pain, urinary/bowel issues, vaginal discharge. She states she "was lying on the table and all of a sudden she felt like she couldn't do it. Her heart started beating faster." Pt states she had to stop treatment, could not finish due to anxiety. Pt has Valium on med list, given to her in ED for low back pain. She states she has never taken it. Pt has transportation to tx daily. Pt requests to see Dr Roselind Messier today.

## 2013-04-09 NOTE — Progress Notes (Signed)
   Department of Radiation Oncology  Phone:  [phone] Fax:        [phone]  Treatment note  The patient was unable to complete her radiation therapy today secondary to anxiety reaction on the treatment machine. She does have Valium given her for muscle spasms and I recommended she take a 5 mg tablet prior to presenting tomorrow for her radiation therapy.  she overall feels anxious today and I recommended she also take this every 8 hours as needed for anxiety.  -----------------------------------  Billie Lade, PhD, MD

## 2013-04-10 ENCOUNTER — Ambulatory Visit: Payer: Medicaid Other | Admitting: Oncology

## 2013-04-10 ENCOUNTER — Ambulatory Visit
Admission: RE | Admit: 2013-04-10 | Discharge: 2013-04-10 | Disposition: A | Discharge status: Home / Self Care | Payer: Medicaid Other | Source: Ambulatory Visit | Attending: Radiation Oncology | Admitting: Radiation Oncology

## 2013-04-10 ENCOUNTER — Other Ambulatory Visit: Payer: Medicaid Other | Admitting: Lab

## 2013-04-13 ENCOUNTER — Ambulatory Visit (INDEPENDENT_AMBULATORY_CARE_PROVIDER_SITE_OTHER): Payer: Medicaid Other | Admitting: Sports Medicine

## 2013-04-13 ENCOUNTER — Encounter: Payer: Self-pay | Admitting: Sports Medicine

## 2013-04-13 ENCOUNTER — Ambulatory Visit
Admission: RE | Admit: 2013-04-13 | Discharge: 2013-04-13 | Disposition: A | Discharge status: Home / Self Care | Payer: Medicaid Other | Source: Ambulatory Visit | Attending: Radiation Oncology | Admitting: Radiation Oncology

## 2013-04-13 VITALS — BP 111/74 | HR 98 | Ht 66.0 in | Wt 282.0 lb

## 2013-04-13 DIAGNOSIS — M545 Low back pain: Secondary | ICD-10-CM

## 2013-04-13 NOTE — Patient Instructions (Addendum)
You have been scheduled for a MRI of your lower back on Saturday 04/18/13 at 4:15pm.  [facility]  [address] [place]  [phone]

## 2013-04-14 ENCOUNTER — Ambulatory Visit
Admission: RE | Admit: 2013-04-14 | Discharge: 2013-04-14 | Disposition: A | Discharge status: Home / Self Care | Payer: Medicaid Other | Source: Ambulatory Visit | Attending: Radiation Oncology | Admitting: Radiation Oncology

## 2013-04-14 ENCOUNTER — Encounter: Payer: Self-pay | Admitting: Radiation Oncology

## 2013-04-14 VITALS — BP 119/81 | HR 89 | Temp 98.4°F | Resp 20 | Wt 283.9 lb

## 2013-04-14 DIAGNOSIS — C541 Malignant neoplasm of endometrium: Secondary | ICD-10-CM

## 2013-04-14 NOTE — Progress Notes (Signed)
  Radiation Oncology         [phone] ________________________________  Name: Deborah [name] MRN: [id number]  Date: 04/14/2013  DOB: 1966-03-15  Weekly Radiation Therapy Management  Current Dose: 9 Gy     Planned Dose:  45 Gy  Narrative . . . . . . . . The patient presents for routine under treatment assessment.                                                     The patient is without complaint of her pain in her low back when lying on the treatment table. The patient is scheduled for MRI of the lumbar spine later this week.  She denies any nausea or diarrhea this week.                                 Set-up films were reviewed.                                 The chart was checked. Physical Findings. . .  weight is 283 lb 14.4 oz (128.776 kg). Her oral temperature is 98.4 F (36.9 C). Her blood pressure is 119/81 and her pulse is 89. Her respiration is 20. . Weight essentially stable.  No significant changes. Impression . . . . . . . The patient is  tolerating radiation. Plan . . . . . . . . . . . . Continue treatment as planned.  ________________________________  -----------------------------------  Billie Lade, PhD, MD

## 2013-04-14 NOTE — Progress Notes (Signed)
Pt denies pain, fatigue, loss of appetite, vaginal discharge, bowel issues, urinary problems. She states last night she did feel lower abdominal cramping, pain. She was constipated, states she had BM this morning. Pt states she became anxious again today during treatment. She took valium last night but not this morning. She states she feels this may improve over time, states she "talked herself out of it".

## 2013-04-14 NOTE — Progress Notes (Signed)
  Subjective:    Patient ID: Deborah [name], female    DOB: 1965/12/12, 47 y.o.   MRN: [id number]  HPI chief complaint: Low back pain  47 year old female comes in today complaining of 2 weeks of low back pain. On May 6 her back "went out" and she was seen in the emergency room. X-rays of her lumbar spine were obtained. She was given a shot of Dilaudid which did help her pain at the time but it is now returned. Describes a diffuse pain across her low-back with occasional radiating pain into each of her hamstrings. She is currently on hydrocodone and Voltaren for her pain. She is currently undergoing radiation for endometrial cancer for which she has also received previous chemotherapy. Pain will at times awaken her from sleep. She had a similar episode of back pain a few years ago. No prior low back surgery. No groin pain. No change in bowel or bladder.  Asked medical history and current medications are reviewed No known drug allergies    Review of Systems     Objective:   Physical Exam Obese, no acute distress. Awake alert and oriented x3  Body habitus makes physical exam difficult in this patient. She has pain with forward flexion but minimal pain with extension. Mild diffuse tenderness to palpation diffusely along the lower lumbar spine and over the left SI joint. Negative straight leg raise bilaterally. Negative log roll bilaterally. Reflexes are equal at the Achilles and patellar tendons bilaterally. Strength is 5/5 both lower extremities. Sensation is grossly intact to light-touch. Walks with a slightly antalgic gait.  X-rays of her lumbar spine are reviewed. Films are dated 03/31/2013. She has some degenerative spurring at L3-L4 and L5-S1. Otherwise unremarkable. No fracture. No signs of metastatic disease,       Assessment & Plan:  1. Low back pain-rule out lumbar disc herniation 2. Endometrial cancer-currently undergoing radiation 3. Obesity  Her pain is intense enough that she  is having to use narcotic medication. She is also undergoing pelvic radiation for her endometrial cancer. Therefore, I would like to pursue an MRI scan of her lumbar spine before deciding on future treatment. If MRI shows only degenerative changes then the patient would benefit best from physical therapy for core strengthening. She will also need to embark on a weight loss program. We'll followup with her via telephone at [phone] after I've reviewed the MRI at which point we will delineate further treatment.

## 2013-04-15 ENCOUNTER — Ambulatory Visit
Admission: RE | Admit: 2013-04-15 | Discharge: 2013-04-15 | Disposition: A | Discharge status: Home / Self Care | Payer: Medicaid Other | Source: Ambulatory Visit | Attending: Radiation Oncology | Admitting: Radiation Oncology

## 2013-04-16 ENCOUNTER — Ambulatory Visit
Admission: RE | Admit: 2013-04-16 | Discharge: 2013-04-16 | Disposition: A | Discharge status: Home / Self Care | Payer: Medicaid Other | Source: Ambulatory Visit | Attending: Radiation Oncology | Admitting: Radiation Oncology

## 2013-04-17 ENCOUNTER — Ambulatory Visit
Admission: RE | Admit: 2013-04-17 | Discharge: 2013-04-17 | Disposition: A | Discharge status: Home / Self Care | Payer: Medicaid Other | Source: Ambulatory Visit | Attending: Radiation Oncology | Admitting: Radiation Oncology

## 2013-04-18 ENCOUNTER — Other Ambulatory Visit: Payer: Medicaid Other

## 2013-04-18 ENCOUNTER — Inpatient Hospital Stay: Admission: RE | Admit: 2013-04-18 | Payer: Medicaid Other | Source: Ambulatory Visit

## 2013-04-21 ENCOUNTER — Encounter: Payer: Self-pay | Admitting: Radiation Oncology

## 2013-04-21 ENCOUNTER — Ambulatory Visit
Admission: RE | Admit: 2013-04-21 | Discharge: 2013-04-21 | Disposition: A | Discharge status: Home / Self Care | Payer: Medicaid Other | Source: Ambulatory Visit | Attending: Radiation Oncology | Admitting: Radiation Oncology

## 2013-04-21 VITALS — BP 132/87 | HR 96 | Resp 18 | Wt 286.3 lb

## 2013-04-21 DIAGNOSIS — C541 Malignant neoplasm of endometrium: Secondary | ICD-10-CM

## 2013-04-21 MED ORDER — LORAZEPAM 1 MG PO TABS: 1.0000 mg | ORAL_TABLET | Freq: Three times a day (TID) | ORAL | Status: DC

## 2013-04-21 NOTE — Progress Notes (Signed)
Patient reports diarrhea x 2 days. Patient reports taking Pepto bismol. Encouraged patient to take imodium for diarrhea and she verbalized understanding. Patient reports pelvis and back pain 7 on a scale of 0-10 for which she take Vicodin. Patient reports laying on treatment table increases back pain. No upcoming chemotherapy scheduled. Patient reports last bout of nausea was one week ago. Patient has not had MRI of spine yet but, is on waiting list. Patient taking zoloft and valium but, continues to report anxiety and inability to sleep. Patient requesting medication to aid with sleep and anxiety. Patient tearful today. Patient reports two dear friends recently passed.

## 2013-04-21 NOTE — Progress Notes (Signed)
  Radiation Oncology         [phone] ________________________________  Name: Deborah [name] MRN: [id number]  Date: 04/21/2013  DOB: 1966/07/15  Weekly Radiation Therapy Management  Current Dose: 16.2 Gy     Planned Dose:  45 Gy  Narrative . . . . . . . . The patient presents for routine under treatment assessment.                                                     The patient is without complaint. She is anxious and tearful today. She has been having difficulty sleeping at night and is fatigued. She's had some problems with diarrhea and she will start taking Imodium for this issue. I have given the patient a limited prescription of Ativan in light of her anxiety.                                 Set-up films were reviewed.                                 The chart was checked. Physical Findings. . .  weight is 286 lb 4.8 oz (129.865 kg). Her blood pressure is 132/87 and her pulse is 96. Her respiration is 18. . Weight essentially stable.  The lungs are clear. The heart has a regular rhythm and rate. The abdomen is soft and nontender with normal bowel sounds. Impression . . . . . . . The patient is  tolerating radiation. Plan . . . . . . . . . . . . Continue treatment as planned.  ________________________________

## 2013-04-22 ENCOUNTER — Ambulatory Visit
Admission: RE | Admit: 2013-04-22 | Discharge: 2013-04-22 | Disposition: A | Discharge status: Home / Self Care | Payer: Medicaid Other | Source: Ambulatory Visit | Attending: Radiation Oncology | Admitting: Radiation Oncology

## 2013-04-23 ENCOUNTER — Ambulatory Visit
Admission: RE | Admit: 2013-04-23 | Discharge: 2013-04-23 | Disposition: A | Discharge status: Home / Self Care | Payer: Medicaid Other | Source: Ambulatory Visit | Attending: Radiation Oncology | Admitting: Radiation Oncology

## 2013-04-23 ENCOUNTER — Ambulatory Visit: Payer: Medicaid Other

## 2013-04-24 ENCOUNTER — Other Ambulatory Visit (HOSPITAL_BASED_OUTPATIENT_CLINIC_OR_DEPARTMENT_OTHER): Payer: Medicaid Other | Admitting: Lab

## 2013-04-24 ENCOUNTER — Telehealth: Payer: Self-pay | Admitting: Oncology

## 2013-04-24 ENCOUNTER — Ambulatory Visit: Payer: Medicaid Other

## 2013-04-24 ENCOUNTER — Other Ambulatory Visit: Payer: Self-pay | Admitting: *Deleted

## 2013-04-24 ENCOUNTER — Ambulatory Visit (HOSPITAL_BASED_OUTPATIENT_CLINIC_OR_DEPARTMENT_OTHER): Payer: Medicaid Other | Admitting: Oncology

## 2013-04-24 ENCOUNTER — Ambulatory Visit
Admission: RE | Admit: 2013-04-24 | Discharge: 2013-04-24 | Disposition: A | Discharge status: Home / Self Care | Payer: Medicaid Other | Source: Ambulatory Visit | Attending: Radiation Oncology | Admitting: Radiation Oncology

## 2013-04-24 ENCOUNTER — Encounter: Payer: Self-pay | Admitting: Oncology

## 2013-04-24 VITALS — BP 109/74 | HR 93 | Temp 98.4°F | Resp 18 | Ht 66.0 in | Wt 283.9 lb

## 2013-04-24 DIAGNOSIS — C541 Malignant neoplasm of endometrium: Secondary | ICD-10-CM

## 2013-04-24 DIAGNOSIS — E669 Obesity, unspecified: Secondary | ICD-10-CM

## 2013-04-24 DIAGNOSIS — C549 Malignant neoplasm of corpus uteri, unspecified: Secondary | ICD-10-CM

## 2013-04-24 LAB — CBC WITH DIFFERENTIAL/PLATELET
Basophils Absolute: 0 10*3/uL (ref 0.0–0.1)
Eosinophils Absolute: 0.1 10*3/uL (ref 0.0–0.5)
HCT: 39.6 % (ref 34.8–46.6)
HGB: 13.2 g/dL (ref 11.6–15.9)
LYMPH%: 11 % — ABNORMAL LOW (ref 14.0–49.7)
MCV: 93.9 fL (ref 79.5–101.0)
MONO#: 0.3 10*3/uL (ref 0.1–0.9)
MONO%: 7.3 % (ref 0.0–14.0)
NEUT#: 3.7 10*3/uL (ref 1.5–6.5)
NEUT%: 78.5 % — ABNORMAL HIGH (ref 38.4–76.8)
Platelets: 204 10*3/uL (ref 145–400)
WBC: 4.7 10*3/uL (ref 3.9–10.3)

## 2013-04-24 LAB — BASIC METABOLIC PANEL (CC13)
BUN: 12.9 mg/dL (ref 7.0–26.0)
CO2: 25 mEq/L (ref 22–29)
Calcium: 9.4 mg/dL (ref 8.4–10.4)
Glucose: 96 mg/dl (ref 70–99)

## 2013-04-24 MED ORDER — DIAZEPAM 5 MG PO TABS: ORAL_TABLET | ORAL | Status: DC

## 2013-04-24 MED ORDER — BIAFINE EX EMUL
CUTANEOUS | Status: DC | PRN
  Administered 2013-04-24: 1 via TOPICAL

## 2013-04-24 NOTE — Telephone Encounter (Signed)
gv and printed appt sched and avs....MW added tx...dr Nelly Rout sched for  9.2.14 @  10:30am

## 2013-04-24 NOTE — Patient Instructions (Signed)
We will send prescription for some valium to [facility], for you to use before radiation IF NEEDED.  Dr Darrold Span will see you back shortly after radiation finishes, and we will resume last 3 cycles of chemo after that  You will see Dr Nelly Rout with CT scans after the chemo completes

## 2013-04-24 NOTE — Telephone Encounter (Signed)
Per staff phone call and POF I have schedueld appts.  JMW  

## 2013-04-24 NOTE — Progress Notes (Signed)
OFFICE PROGRESS NOTE   04/24/2013   Physicians:W.Brewster, J.Kinard, M.Billey Co (PCP [facility]) J.Hochrein, Reino Bellis   INTERVAL HISTORY:   Patient is seen, alone for visit, in continuing attention to IIIC grade 2 endometrial cancer, now receiving radiation by Dr Roselind Messier, with plans to complete final 3 cycle of taxol carboplatin after RT. She is tolerating radiation well other than low back discomfort from positioning on that table and anxiety/ panic attack due to that. She has chronic low back pain which has been much more symptomatic thru radiation, was seen in ED for this on 03-31-13 with plain films showing degenerative changes and MRI LS by Dr Reino Bellis pending. She does not have PAC.  Patient presented with spotting and menorrhagia, last PAP 2 years prior. Biopsies by Dr Nicholaus Bloom 09-22-2012 (path [id number]) showed endometroid adenocarcinoma in endometrial biopsy and in endocervical curettage. She went to robotic hysterectomy with BSO by Dr Nelly Rout on 10-07-12 (no lymph nodes sampled as serosal cysts appeared malignant). Pathology [phone]) showed grade 2 endometroid adenocarcinoma with involvement of < 1/2 of myometrium, no LVSI, did involve into lower uterine segment, ovaries and tubes benign and the serosal cysts benign. CT AP 11-25-12 showed shotty bilateral external iliac nodes up to 1.3 cm and nonspecific < 5 mm left para-aortic nodes. She saw Dr Nelly Rout following the CT, with recommendation for chemotherapy and radiation in sandwich fashion. Patient cancelled her new patient visit with me in Jan, that rescheduled. She had consultation visit with Dr Roselind Messier on 12-29-12, with recommendation for pelvic radiation and intracavitary brachytherapy after 3 cycles of chemotherapy. Cycle 1 taxol/carboplatin was given 01-07-13 and cycle 3 completed 02-18-13. IMRT began ~ 04-07-13. Plan is for 3 additional cycles of chemo after RT, then repeat CT AP and follow up with Dr  Nelly Rout.   Diarrhea with radiation, 3x yesterday. She has preferred not to use regular imodium, is pushing fluids. She used a few valium from ED for back symptoms, also helpful with anxiety associated with RT, but did have panic attack such that RT had to be held on 5-15. No fever, no N/V, no significant peripheral neuropathy symptoms, some fatigue. Remainder of 10 point Review of Systems negative.  She is reluctant to have chemo resumed after RT completes 6-18, but I have encouraged her to get the treatments completed. She does have transportation service for treatments and visits.  Objective:  Vital signs in last 24 hours:  BP 109/74  Pulse 93  Temp(Src) 98.4 F (36.9 C) (Oral)  Resp 18  Ht 5\' 6"  (1.676 m)  Wt 283 lb 14.4 oz (128.776 kg)  BMI 45.84 kg/m2  SpO2 100%  LMP 09/20/2012  Weight is up 5 lbs. Ambulatory without assistance, does not tolerate flat on exam table.   HEENT:PERRLA, sclera clear, anicteric and oropharynx clear, no lesions LymphaticsCervical, supraclavicular, and axillary nodes normal. No inguinal adenopathy Resp: clear to auscultation bilaterally and normal percussion bilaterally Cardio: regular rate and rhythm GI: obese, soft, not tender, normal BS, hyperpigmentation in RT field without skin breakdown. Back without spinal tenderness to palpation, no rash Extremities: no edema, cords, tenderness. Congenital defect right hand Neuro:no sensory deficits noted Skin without rash or ecchymosis No central catheter  Lab Results:  Results for orders placed in visit on 04/24/13  CBC WITH DIFFERENTIAL      Result Value Range   WBC 4.7  3.9 - 10.3 10e3/uL   NEUT# 3.7  1.5 - 6.5 10e3/uL   HGB 13.2  11.6 -  15.9 g/dL   HCT 16.1  09.6 - 04.5 %   Platelets 204  145 - 400 10e3/uL   MCV 93.9  79.5 - 101.0 fL   MCH 31.3  25.1 - 34.0 pg   MCHC 33.4  31.5 - 36.0 g/dL   RBC 4.09  8.11 - 9.14 10e6/uL   RDW 15.8 (*) 11.2 - 14.5 %   lymph# 0.5 (*) 0.9 - 3.3 10e3/uL    MONO# 0.3  0.1 - 0.9 10e3/uL   Eosinophils Absolute 0.1  0.0 - 0.5 10e3/uL   Basophils Absolute 0.0  0.0 - 0.1 10e3/uL   NEUT% 78.5 (*) 38.4 - 76.8 %   LYMPH% 11.0 (*) 14.0 - 49.7 %   MONO% 7.3  0.0 - 14.0 %   EOS% 3.0  0.0 - 7.0 %   BASO% 0.2  0.0 - 2.0 %  BASIC METABOLIC PANEL (CC13)      Result Value Range   Sodium 138  136 - 145 mEq/L   Potassium 3.3 (*) 3.5 - 5.1 mEq/L   Chloride 103  98 - 107 mEq/L   CO2 25  22 - 29 mEq/L   Glucose 96  70 - 99 mg/dl   BUN 78.2  7.0 - 95.6 mg/dL   Creatinine 0.8  0.6 - 1.1 mg/dL   Calcium 9.4  8.4 - 21.3 mg/dL     Studies/Results: LUMBAR SPINE - COMPLETE 4+ VIEW 03-31-13 Comparison: None.  Findings: Five views of the lumbar spine submitted. Moderate  anterior spurring noted at L3-L4 level. Moderate anterior spurring  and mild disc space flattening at L5 S1 level. Mild anterior  spurring at L1 - L2 level. Alignment and vertebral heights are  preserved.  IMPRESSION:  No acute fracture or subluxation. Degenerative changes as  described above.   LEFT HIP - COMPLETE 2+ VIEW  03-31-13 Comparison: None.  Findings: Three views of the left hip submitted. No acute fracture  or subluxation. Pelvic phleboliths are noted.  IMPRESSION:  No acute fracture or subluxation   Medications: I have reviewed the patient's current medications. Prescription for valium 5 mg # 14 to use for back spasm or severe anxiety with RT.  Assessment/Plan:  1.IIIC endometrial carcinoma: post hysterectomy/BSO 10-07-12 with serosal cysts benign but adenopathy by subsequent CT. Taxol/carboplatin x 3 cycles from 01-07-13 thru 02-18-13 with neulasta.  IMRT by Dr Roselind Messier to complete 05-13-13, then 3 additional cycles of chemotherapy. I will see her back on 05-18-13 and will have cycle 4 taxol carbo on 05-20-13. She will need neulasta ~ day 7 (delay due to taxol aches). May need to adjust doses cycle 4 due to proximity to RT depending on counts 05-18-13. 2. Increase in usual chronic low  back pain, seems exacerbated by positioning for RT. Plain films a above, MRI pending. Valium #14 tablets today, but would not continue to prescribe this without rest of evaluation pending.  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 4.constipation improved, continue laxatives  5.degenerative arthritis  6.morbid obesity: ongoing education by dietician  7.hypokalemia: continue oral potassium and follow. Will decrease to 10 mEq daily as she has difficulty swallowing 20 mEq tablets. Hopefully will improve when RT diarrhea resolves  Patient understands plan and is in agreement.  Lexxus Underhill P, MD   04/24/2013, 9:30 PM

## 2013-04-25 ENCOUNTER — Ambulatory Visit: Payer: Medicaid Other

## 2013-04-26 ENCOUNTER — Other Ambulatory Visit: Payer: Medicaid Other

## 2013-04-27 ENCOUNTER — Other Ambulatory Visit: Payer: Self-pay | Admitting: *Deleted

## 2013-04-27 ENCOUNTER — Ambulatory Visit: Payer: Medicaid Other

## 2013-04-27 ENCOUNTER — Telehealth: Payer: Self-pay | Admitting: *Deleted

## 2013-04-27 MED ORDER — POTASSIUM CHLORIDE ER 10 MEQ PO TBCR: 10.0000 meq | EXTENDED_RELEASE_TABLET | Freq: Every day | ORAL | Status: DC

## 2013-04-27 NOTE — Telephone Encounter (Signed)
Pt notified of results below. Instructed to take at least 10 meq daily. Will call in refill to [facility] aid on [employer]

## 2013-04-27 NOTE — Telephone Encounter (Signed)
Message copied by Phillis Knack on Mon Apr 27, 2013  9:29 AM ------      Message from: Reece Packer      Created: Sat Apr 25, 2013 11:52 AM       Labs seen and need follow up: please let her know K still a little low at 3.3, probably with the diarrhea. She needs to continue potassium, at least 10 mEq daily, and can refill as 10 mEq tablets as she has trouble swallowing the larger size, 30 days 2 RF if so.      Cc LA, TH ------

## 2013-04-28 ENCOUNTER — Encounter: Payer: Self-pay | Admitting: Radiation Oncology

## 2013-04-28 ENCOUNTER — Ambulatory Visit
Admission: RE | Admit: 2013-04-28 | Discharge: 2013-04-28 | Disposition: A | Discharge status: Home / Self Care | Payer: Medicaid Other | Source: Ambulatory Visit | Attending: Radiation Oncology | Admitting: Radiation Oncology

## 2013-04-28 VITALS — BP 118/86 | HR 82 | Temp 98.3°F | Resp 20 | Wt 286.6 lb

## 2013-04-28 DIAGNOSIS — C541 Malignant neoplasm of endometrium: Secondary | ICD-10-CM

## 2013-04-28 NOTE — Progress Notes (Signed)
[facility]    Radiation Oncology [address] [place]     [name], M.D. [place], [place] [address]               Billie Lade, M.D., Ph.D. Phone: [phone]      Molli Hazard A. Kathrynn Running, M.D. Fax: [phone]      Radene Gunning, M.D., Ph.D.         Lurline Hare, M.D.         Grayland Jack, M.D Weekly Treatment Management Note  Name: Deborah [name]     MRN: [id number]        CSN: [id number] Date: 04/28/2013      DOB: 1966/01/06  CC: Ardyth Gal, MD         Lula Olszewski    Status: Outpatient  Diagnosis: Stage III-C endometrial cancer  Current Dose: 23.4 Gy  Current Fraction: 13  Planned Dose: 45 Gy  Narrative: Deborah [name] was seen today for weekly treatment management. The chart was checked and MVCT  were reviewed. sHe continues to have some intermittent diarrhea but is taking Pepto-Bismol is helped this issue. She did not wish to take Imodium. She has minimal nausea at this time. She has intermittent anxiety and is taking Valium for this issue. She also has a prescription for Ativan concerning this issue. She denies any bladder or rectal issues at this time.  sHe is also having fatigue at this time.  Review of patient's allergies indicates no known allergies.  Current Outpatient Prescriptions  Medication Sig Dispense Refill  . aspirin 81 MG tablet Take 81 mg by mouth every morning.       . bifidobacterium infantis (ALIGN) capsule Take 1 capsule by mouth daily as needed.      . bismuth subsalicylate (PEPTO BISMOL) 262 MG chewable tablet Chew 524 mg by mouth as needed for indigestion.      [name] dexamethasone (DECADRON) 4 MG tablet Take 5 tablets with food 12 hrs. and 6 hrs. Prior to Taxol chemotherapy.  20 tablet  1  . diazepam (VALIUM) 5 MG tablet 1 daily prn back pain or anxiety prior to radiation therapy  14 tablet  0  . diclofenac (VOLTAREN) 75 MG EC tablet Take 1 tablet (75 mg total) by mouth 2 (two) times daily.  30 tablet  0  . ferrous  sulfate 325 (65 FE) MG tablet 325 mg every evening. Takes twice a week      . furosemide (LASIX) 20 MG tablet Take 1 tablet (20 mg total) by mouth every morning.  30 tablet  3  . HYDROcodone-acetaminophen (NORCO/VICODIN) 5-325 MG per tablet Take 1-2 tablets every 6 hours as needed for severe pain  12 tablet  0  . lactase (LACTAID) 3000 UNITS tablet Take 1 tablet by mouth 3 (three) times daily with meals as needed.      . loratadine (CLARITIN) 10 MG tablet Take 10 mg by mouth daily.      [name] LORazepam (ATIVAN) 1 MG tablet Take 1 tablet (1 mg total) by mouth every 8 (eight) hours.  30 tablet  0  . ondansetron (ZOFRAN) 8 MG tablet Take 1-2 tablets (8-16 mg total) by mouth every 8 (eight) hours as needed for nausea (Will not make you drowsy).  30 tablet  1  . OVER THE COUNTER MEDICATION Allergy Plus Sinus Headache- acetaminophen 325, diphenhydramine 12.5, phenylephrine 5 mg      . OVER THE COUNTER MEDICATION Medi-phenyl 5 mg      .  polyethylene glycol powder (MIRALAX) powder Take 17 g by mouth 2 (two) times daily as needed.  255 g  2  . potassium chloride (K-DUR) 10 MEQ tablet Take 1 tablet (10 mEq total) by mouth daily.  30 tablet  2  . PRESCRIPTION MEDICATION PT STATES SHE HAD HER LAST TREATMENT ON 02-18-13 AND AT THIS TIME PT IS NOT SCHEDULED FOR ANOTHER TREATMENT PENDING HER RADIATION AND CT SCAN, SHE IS FOLLOWED BY DR LIVESAY.      [name] prochlorperazine (COMPAZINE) 10 MG tablet Take 1 tablet (10 mg total) by mouth every 6 (six) hours as needed.  20 tablet  0  . sertraline (ZOLOFT) 100 MG tablet Take 100 mg by mouth daily.      . traMADol (ULTRAM) 50 MG tablet Take 1 tablet (50 mg total) by mouth every 8 (eight) hours as needed for pain.  30 tablet  2  . emollient (BIAFINE) cream Apply 1 application topically 2 (two) times daily. Apply to affected area after rad txs and bedtime , not 4 hours prior to treatments       No current facility-administered medications for this encounter.   Labs:  Lab Results   Component Value Date   WBC 4.7 04/24/2013   HGB 13.2 04/24/2013   HCT 39.6 04/24/2013   MCV 93.9 04/24/2013   PLT 204 04/24/2013   Lab Results  Component Value Date   CREATININE 0.8 04/24/2013   BUN 12.9 04/24/2013   NA 138 04/24/2013   K 3.3* 04/24/2013   CL 103 04/24/2013   CO2 25 04/24/2013   Lab Results  Component Value Date   ALT 21 02/25/2013   AST 12 02/25/2013   BILITOT 0.38 02/25/2013    Physical Examination:  weight is 286 lb 9.6 oz (130.001 kg). Her oral temperature is 98.3 F (36.8 C). Her blood pressure is 118/86 and her pulse is 82. Her respiration is 20.    Wt Readings from Last 3 Encounters:  04/28/13 286 lb 9.6 oz (130.001 kg)  04/24/13 283 lb 14.4 oz (128.776 kg)  04/21/13 286 lb 4.8 oz (129.865 kg)     Lungs - Normal respiratory effort, chest expands symmetrically. Lungs are clear to auscultation, no crackles or wheezes.  Heart has regular rhythm and rate  Abdomen is soft and non tender with normal bowel sounds  Assessment:  Patient tolerating treatments well except for issues as above  Plan: Continue treatment per original radiation prescription

## 2013-04-28 NOTE — Progress Notes (Signed)
Pt denies pain, vaginal discharge, urinary problems, loss of appetite. She reports fatigue, diarrhea x 3 days last week relieved w/Pepto Bismol. She is applying Biafine to groin area for occasional itching.

## 2013-04-29 ENCOUNTER — Ambulatory Visit
Admission: RE | Admit: 2013-04-29 | Discharge: 2013-04-29 | Disposition: A | Discharge status: Home / Self Care | Payer: Medicaid Other | Source: Ambulatory Visit | Attending: Radiation Oncology | Admitting: Radiation Oncology

## 2013-04-30 ENCOUNTER — Ambulatory Visit: Payer: Medicaid Other

## 2013-04-30 ENCOUNTER — Ambulatory Visit
Admission: RE | Admit: 2013-04-30 | Discharge: 2013-04-30 | Disposition: A | Discharge status: Home / Self Care | Payer: Medicaid Other | Source: Ambulatory Visit | Attending: Radiation Oncology | Admitting: Radiation Oncology

## 2013-05-01 ENCOUNTER — Ambulatory Visit
Admission: RE | Admit: 2013-05-01 | Discharge: 2013-05-01 | Disposition: A | Discharge status: Home / Self Care | Payer: Medicaid Other | Source: Ambulatory Visit | Attending: Radiation Oncology | Admitting: Radiation Oncology

## 2013-05-01 ENCOUNTER — Ambulatory Visit: Payer: Medicaid Other

## 2013-05-04 ENCOUNTER — Ambulatory Visit
Admission: RE | Admit: 2013-05-04 | Discharge: 2013-05-04 | Disposition: A | Discharge status: Home / Self Care | Payer: Medicaid Other | Source: Ambulatory Visit | Attending: Radiation Oncology | Admitting: Radiation Oncology

## 2013-05-05 ENCOUNTER — Ambulatory Visit
Admission: RE | Admit: 2013-05-05 | Discharge: 2013-05-05 | Disposition: A | Discharge status: Home / Self Care | Payer: Medicaid Other | Source: Ambulatory Visit | Attending: Radiation Oncology | Admitting: Radiation Oncology

## 2013-05-05 ENCOUNTER — Encounter: Payer: Self-pay | Admitting: Radiation Oncology

## 2013-05-05 VITALS — BP 117/75 | HR 80 | Temp 98.5°F | Resp 20 | Wt 287.5 lb

## 2013-05-05 DIAGNOSIS — C541 Malignant neoplasm of endometrium: Secondary | ICD-10-CM

## 2013-05-05 MED ORDER — PHENAZOPYRIDINE HCL 200 MG PO TABS: 200.0000 mg | ORAL_TABLET | Freq: Three times a day (TID) | ORAL | Status: DC | PRN

## 2013-05-05 MED ORDER — DIAZEPAM 5 MG PO TABS: 5.0000 mg | ORAL_TABLET | Freq: Three times a day (TID) | ORAL | Status: DC | PRN

## 2013-05-05 NOTE — Progress Notes (Signed)
[facility]    Radiation Oncology [address] [place]     [name], M.D. [place], [place] [address]               Billie Lade, M.D., Ph.D. Phone: [phone]      Molli Hazard A. Kathrynn Running, M.D. Fax: [phone]      Radene Gunning, M.D., Ph.D.         Lurline Hare, M.D.         Grayland Jack, M.D Weekly Treatment Management Note  Name: Deborah [name]     MRN: [id number]        CSN: [id number] Date: 05/05/2013      DOB: 1966-03-03  CC: Ardyth Gal, MD         Lula Olszewski    Status: Outpatient  Diagnosis: The encounter diagnosis was Endometrial cancer.  Current Dose: 32.4 Gy  Current Fraction: 18  Planned Dose: 45 Gy  Narrative: [name] was seen today for weekly treatment management. The chart was checked and MVCT  were reviewed. She has had some diarrhea this week. This is controlled well with Imodium. She is also noticed some dysuria. I have given the patient a prescription for Pyridium.  She continues to have a lot of anxiety and back pain with the set up. The Valium works well for this situation. I did refill this medication today.  Review of patient's allergies indicates no known allergies. Current Outpatient Prescriptions  Medication Sig Dispense Refill  . aspirin 81 MG tablet Take 81 mg by mouth every morning.       . bifidobacterium infantis (ALIGN) capsule Take 1 capsule by mouth daily as needed.      . bismuth subsalicylate (PEPTO BISMOL) 262 MG chewable tablet Chew 524 mg by mouth as needed for indigestion.      [name] dexamethasone (DECADRON) 4 MG tablet Take 5 tablets with food 12 hrs. and 6 hrs. Prior to Taxol chemotherapy.  20 tablet  1  . diazepam (VALIUM) 5 MG tablet Take 1 tablet (5 mg total) by mouth every 8 (eight) hours as needed for anxiety. 1 daily prn back pain or anxiety prior to radiation therapy  30 tablet  0  . diclofenac (VOLTAREN) 75 MG EC tablet Take 1 tablet (75 mg total) by mouth 2 (two) times daily.  30 tablet  0   . emollient (BIAFINE) cream Apply 1 application topically 2 (two) times daily. Apply to affected area after rad txs and bedtime , not 4 hours prior to treatments      . ferrous sulfate 325 (65 FE) MG tablet 325 mg every evening. Takes twice a week      . furosemide (LASIX) 20 MG tablet Take 1 tablet (20 mg total) by mouth every morning.  30 tablet  3  . HYDROcodone-acetaminophen (NORCO/VICODIN) 5-325 MG per tablet Take 1-2 tablets every 6 hours as needed for severe pain  12 tablet  0  . lactase (LACTAID) 3000 UNITS tablet Take 1 tablet by mouth 3 (three) times daily with meals as needed.      . loratadine (CLARITIN) 10 MG tablet Take 10 mg by mouth daily.      [name] LORazepam (ATIVAN) 1 MG tablet Take 1 tablet (1 mg total) by mouth every 8 (eight) hours.  30 tablet  0  . ondansetron (ZOFRAN) 8 MG tablet Take 1-2 tablets (8-16 mg total) by mouth every 8 (eight) hours as needed for nausea (Will not make you drowsy).  30 tablet  1  . OVER THE COUNTER MEDICATION Allergy Plus Sinus Headache- acetaminophen 325, diphenhydramine 12.5, phenylephrine 5 mg      . OVER THE COUNTER MEDICATION Medi-phenyl 5 mg      . polyethylene glycol powder (MIRALAX) powder Take 17 g by mouth 2 (two) times daily as needed.  255 g  2  . potassium chloride (K-DUR) 10 MEQ tablet Take 1 tablet (10 mEq total) by mouth daily.  30 tablet  2  . PRESCRIPTION MEDICATION PT STATES SHE HAD HER LAST TREATMENT ON 02-18-13 AND AT THIS TIME PT IS NOT SCHEDULED FOR ANOTHER TREATMENT PENDING HER RADIATION AND CT SCAN, SHE IS FOLLOWED BY DR LIVESAY.      [name] prochlorperazine (COMPAZINE) 10 MG tablet Take 1 tablet (10 mg total) by mouth every 6 (six) hours as needed.  20 tablet  0  . sertraline (ZOLOFT) 100 MG tablet Take 100 mg by mouth daily.      . traMADol (ULTRAM) 50 MG tablet Take 1 tablet (50 mg total) by mouth every 8 (eight) hours as needed for pain.  30 tablet  2  . phenazopyridine (PYRIDIUM) 200 MG tablet Take 1 tablet (200 mg total) by  mouth 3 (three) times daily as needed for pain.  30 tablet  0   No current facility-administered medications for this encounter.   Labs:  Lab Results  Component Value Date   WBC 4.7 04/24/2013   HGB 13.2 04/24/2013   HCT 39.6 04/24/2013   MCV 93.9 04/24/2013   PLT 204 04/24/2013   Lab Results  Component Value Date   CREATININE 0.8 04/24/2013   BUN 12.9 04/24/2013   NA 138 04/24/2013   K 3.3* 04/24/2013   CL 103 04/24/2013   CO2 25 04/24/2013   Lab Results  Component Value Date   ALT 21 02/25/2013   AST 12 02/25/2013   BILITOT 0.38 02/25/2013    Physical Examination:  weight is 287 lb 8 oz (130.409 kg). Her oral temperature is 98.5 F (36.9 C). Her blood pressure is 117/75 and her pulse is 80. Her respiration is 20.    Wt Readings from Last 3 Encounters:  05/05/13 287 lb 8 oz (130.409 kg)  04/28/13 286 lb 9.6 oz (130.001 kg)  04/24/13 283 lb 14.4 oz (128.776 kg)     Lungs - Normal respiratory effort, chest expands symmetrically. Lungs are clear to auscultation, no crackles or wheezes.  Heart has regular rhythm and rate  Abdomen is soft and non tender with normal bowel sounds  Assessment:  Patient tolerating treatments well except for issues as above  Plan: Continue treatment per original radiation prescription

## 2013-05-05 NOTE — Progress Notes (Signed)
Pt reports dysuria x 2-3 days, pain after voiding. She states urine is clear yellow. She states she is drinking lots of fluids. She has urinary freq, nocturia which she states depends on the amount of water/fluids she drinks at night and her Lasix.  She had 3 episodes of diarrhea past 2 days, took [employer] w/relief. She states she has abdominal cramping w/diarrhea. She is applying Biafine cream to lower abdomen under stomach fold where she states she has "whelps and occasional itching". She denies rash, but states skin is darkened. Pt denies loss of appetite, was fatigued over weekend.

## 2013-05-06 ENCOUNTER — Ambulatory Visit
Admission: RE | Admit: 2013-05-06 | Discharge: 2013-05-06 | Disposition: A | Discharge status: Home / Self Care | Payer: Medicaid Other | Source: Ambulatory Visit | Attending: Radiation Oncology | Admitting: Radiation Oncology

## 2013-05-07 ENCOUNTER — Ambulatory Visit
Admission: RE | Admit: 2013-05-07 | Discharge: 2013-05-07 | Disposition: A | Discharge status: Home / Self Care | Payer: Medicaid Other | Source: Ambulatory Visit | Attending: Radiation Oncology | Admitting: Radiation Oncology

## 2013-05-08 ENCOUNTER — Ambulatory Visit
Admission: RE | Admit: 2013-05-08 | Discharge: 2013-05-08 | Disposition: A | Discharge status: Home / Self Care | Payer: Medicaid Other | Source: Ambulatory Visit | Attending: Radiation Oncology | Admitting: Radiation Oncology

## 2013-05-09 ENCOUNTER — Ambulatory Visit
Admission: RE | Admit: 2013-05-09 | Discharge: 2013-05-09 | Disposition: A | Discharge status: Home / Self Care | Payer: Medicaid Other | Source: Ambulatory Visit | Attending: Sports Medicine | Admitting: Sports Medicine

## 2013-05-09 DIAGNOSIS — M545 Low back pain: Secondary | ICD-10-CM

## 2013-05-11 ENCOUNTER — Ambulatory Visit
Admission: RE | Admit: 2013-05-11 | Discharge: 2013-05-11 | Disposition: A | Discharge status: Home / Self Care | Payer: Medicaid Other | Source: Ambulatory Visit | Attending: Radiation Oncology | Admitting: Radiation Oncology

## 2013-05-11 ENCOUNTER — Ambulatory Visit: Payer: Medicaid Other

## 2013-05-12 ENCOUNTER — Encounter: Payer: Self-pay | Admitting: Radiation Oncology

## 2013-05-12 ENCOUNTER — Ambulatory Visit: Payer: Medicaid Other

## 2013-05-12 ENCOUNTER — Ambulatory Visit
Admission: RE | Admit: 2013-05-12 | Discharge: 2013-05-12 | Disposition: A | Discharge status: Home / Self Care | Payer: Medicaid Other | Source: Ambulatory Visit | Attending: Radiation Oncology | Admitting: Radiation Oncology

## 2013-05-12 ENCOUNTER — Ambulatory Visit: Payer: Medicaid Other | Admitting: Radiation Oncology

## 2013-05-12 VITALS — Wt 282.6 lb

## 2013-05-12 VITALS — BP 123/94 | HR 75 | Temp 98.3°F | Resp 20 | Wt 284.5 lb

## 2013-05-12 DIAGNOSIS — C541 Malignant neoplasm of endometrium: Secondary | ICD-10-CM

## 2013-05-12 NOTE — Progress Notes (Signed)
[facility]    Radiation Oncology [address] [place]     [name], M.D. [place], [place] [address]               Billie Lade, M.D., Ph.D. Phone: [phone]      Molli Hazard A. Kathrynn Running, M.D. Fax: [phone]      Radene Gunning, M.D., Ph.D.         Lurline Hare, M.D.         Grayland Jack, M.D Weekly Treatment Management Note  Name: Deborah [name]     MRN: [id number]        CSN: [id number] Date: 05/12/2013      DOB: 20-Jul-1966  CC: Ardyth Gal, MD         Lula Olszewski    Status: Outpatient  Diagnosis: The encounter diagnosis was Endometrial cancer.  Current Dose: 41.4 Gy  Current Fraction: 23  Planned Dose: 45 Gy  Narrative: [name] was seen today for weekly treatment management. The chart was checked and MVCT  were reviewed. She does have some fatigue at this time. She has noticed some intermittent diarrhea. She did have some dysuria but this is better with pyridium.  Review of patient's allergies indicates no known allergies.  Current Outpatient Prescriptions  Medication Sig Dispense Refill  . aspirin 81 MG tablet Take 81 mg by mouth every morning.       . bifidobacterium infantis (ALIGN) capsule Take 1 capsule by mouth daily as needed.      . bismuth subsalicylate (PEPTO BISMOL) 262 MG chewable tablet Chew 524 mg by mouth as needed for indigestion.      [name] dexamethasone (DECADRON) 4 MG tablet Take 5 tablets with food 12 hrs. and 6 hrs. Prior to Taxol chemotherapy.  20 tablet  1  . diazepam (VALIUM) 5 MG tablet Take 1 tablet (5 mg total) by mouth every 8 (eight) hours as needed for anxiety. 1 daily prn back pain or anxiety prior to radiation therapy  30 tablet  0  . diclofenac (VOLTAREN) 75 MG EC tablet Take 1 tablet (75 mg total) by mouth 2 (two) times daily.  30 tablet  0  . emollient (BIAFINE) cream Apply 1 application topically 2 (two) times daily. Apply to affected area after rad txs and bedtime , not 4 hours prior to treatments       . ferrous sulfate 325 (65 FE) MG tablet 325 mg every evening. Takes twice a week      . furosemide (LASIX) 20 MG tablet Take 1 tablet (20 mg total) by mouth every morning.  30 tablet  3  . HYDROcodone-acetaminophen (NORCO/VICODIN) 5-325 MG per tablet Take 1-2 tablets every 6 hours as needed for severe pain  12 tablet  0  . lactase (LACTAID) 3000 UNITS tablet Take 1 tablet by mouth 3 (three) times daily with meals as needed.      . loperamide (IMODIUM) 2 MG capsule Take 2 mg by mouth 4 (four) times daily as needed for diarrhea or loose stools.      [name] loratadine (CLARITIN) 10 MG tablet Take 10 mg by mouth daily.      [name] LORazepam (ATIVAN) 1 MG tablet Take 1 tablet (1 mg total) by mouth every 8 (eight) hours.  30 tablet  0  . ondansetron (ZOFRAN) 8 MG tablet Take 1-2 tablets (8-16 mg total) by mouth every 8 (eight) hours as needed for nausea (Will not make you drowsy).  30 tablet  1  . OVER THE COUNTER MEDICATION Allergy Plus Sinus Headache- acetaminophen 325, diphenhydramine 12.5, phenylephrine 5 mg      . OVER THE COUNTER MEDICATION Medi-phenyl 5 mg      . phenazopyridine (PYRIDIUM) 200 MG tablet Take 1 tablet (200 mg total) by mouth 3 (three) times daily as needed for pain.  30 tablet  0  . polyethylene glycol powder (MIRALAX) powder Take 17 g by mouth 2 (two) times daily as needed.  255 g  2  . potassium chloride (K-DUR) 10 MEQ tablet Take 1 tablet (10 mEq total) by mouth daily.  30 tablet  2  . PRESCRIPTION MEDICATION PT STATES SHE HAD HER LAST TREATMENT ON 02-18-13 AND AT THIS TIME PT IS NOT SCHEDULED FOR ANOTHER TREATMENT PENDING HER RADIATION AND CT SCAN, SHE IS FOLLOWED BY DR LIVESAY.      [name] prochlorperazine (COMPAZINE) 10 MG tablet Take 1 tablet (10 mg total) by mouth every 6 (six) hours as needed.  20 tablet  0  . sertraline (ZOLOFT) 100 MG tablet Take 100 mg by mouth daily.      . traMADol (ULTRAM) 50 MG tablet Take 1 tablet (50 mg total) by mouth every 8 (eight) hours as needed for pain.   30 tablet  2   No current facility-administered medications for this encounter.   Labs:  Lab Results  Component Value Date   WBC 4.7 04/24/2013   HGB 13.2 04/24/2013   HCT 39.6 04/24/2013   MCV 93.9 04/24/2013   PLT 204 04/24/2013   Lab Results  Component Value Date   CREATININE 0.8 04/24/2013   BUN 12.9 04/24/2013   NA 138 04/24/2013   K 3.3* 04/24/2013   CL 103 04/24/2013   CO2 25 04/24/2013   Lab Results  Component Value Date   ALT 21 02/25/2013   AST 12 02/25/2013   BILITOT 0.38 02/25/2013    Physical Examination:  weight is 284 lb 8 oz (129.048 kg). Her oral temperature is 98.3 F (36.8 C). Her blood pressure is 123/94 and her pulse is 75. Her respiration is 20.    Wt Readings from Last 3 Encounters:  05/12/13 284 lb 8 oz (129.048 kg)  05/05/13 287 lb 8 oz (130.409 kg)  04/28/13 286 lb 9.6 oz (130.001 kg)     Lungs - Normal respiratory effort, chest expands symmetrically. Lungs are clear to auscultation, no crackles or wheezes.  Heart has regular rhythm and rate  Abdomen is soft and non tender with normal bowel sounds  Assessment:  Patient tolerating treatments well  Plan: Continue treatment per original radiation prescription

## 2013-05-12 NOTE — Progress Notes (Signed)
Pt denies pain, vaginal discharge, loss of appetite, fatigue. She states she had diarrhea x 1 1/2 days, took Imodium w/good relief. Pt taking Pyridium for dysuria and states it has resolved. Pt completes in 2 days, gave her 1 month FU card.

## 2013-05-13 ENCOUNTER — Telehealth: Payer: Self-pay | Admitting: Sports Medicine

## 2013-05-13 ENCOUNTER — Ambulatory Visit
Admission: RE | Admit: 2013-05-13 | Discharge: 2013-05-13 | Disposition: A | Discharge status: Home / Self Care | Payer: Medicaid Other | Source: Ambulatory Visit | Attending: Radiation Oncology | Admitting: Radiation Oncology

## 2013-05-13 ENCOUNTER — Ambulatory Visit: Payer: Medicaid Other

## 2013-05-13 NOTE — Telephone Encounter (Signed)
I spoke with the patient on the phone today regarding MRI findings of her lumbar spine. 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 Some mild degenerative disc disease at L5-S1. No significant central or foraminal stenosis. No lumbar disc herniation. No evidence for metastatic disease. Tomorrow is her last day of radiation treatment and her pain is worse with having to lay on that hardt table. I recommended that we give her back pain 2-3 weeks to see if it improves now that her radiation treatment is coming to an end. If her pain persists I discussed the merits of formal physical therapy which the patient would be interested in. She can continue with her when necessary muscle relaxers and will followup for ongoing or recalcitrant issues.

## 2013-05-14 ENCOUNTER — Telehealth: Payer: Self-pay

## 2013-05-14 ENCOUNTER — Ambulatory Visit: Payer: Medicaid Other

## 2013-05-14 ENCOUNTER — Ambulatory Visit
Admission: RE | Admit: 2013-05-14 | Discharge: 2013-05-14 | Disposition: A | Discharge status: Home / Self Care | Payer: Medicaid Other | Source: Ambulatory Visit | Attending: Radiation Oncology | Admitting: Radiation Oncology

## 2013-05-14 NOTE — Progress Notes (Signed)
eot 25/25 per MD doesn't ned to be seen today seen Tuesday, unless patient has any concerns, asked patient if she needed to be seen by MD,"NO, I"m doing great", already has 1 month f/u appt 9:35 AM

## 2013-05-14 NOTE — Telephone Encounter (Signed)
Deborah [name] called to let Dr. Darrold Span know that she will not be here next week for visit and chemotherapy as she is going out of town from 05-15-13 through 06-01-13.  She is sorry that she will not see Dr. Darrold Span before she leaves [facility].   It has been a joy and a pleasure to have Dr. Darrold Span for a doctor. She will miss Dr. Darrold Span. Thanks for everything.

## 2013-05-18 ENCOUNTER — Ambulatory Visit: Payer: Medicaid Other | Admitting: Oncology

## 2013-05-18 ENCOUNTER — Other Ambulatory Visit: Payer: Medicaid Other | Admitting: Lab

## 2013-05-20 ENCOUNTER — Ambulatory Visit: Payer: Medicaid Other

## 2013-05-25 ENCOUNTER — Encounter: Payer: Self-pay | Admitting: Radiation Oncology

## 2013-05-25 NOTE — Progress Notes (Signed)
  Radiation Oncology         [phone] ________________________________  Name: Deborah [name] MRN: [id number]  Date: 05/25/2013  DOB: 1966/03/15  End of Treatment Note  Diagnosis:   Stage III-C1 grade 2 endometrial adenocarcinoma      Indication for treatment:  Postop with risk for pelvic recurrence       Radiation treatment dates:   May 13 through June 19  Site/dose:   Pelvis 45 gray in 25 fractions  Beams/energy:   Helical intensity modulated radiation therapy, 6 MV photons  Narrative: The patient tolerated radiation treatment relatively well.   She did experience fatigue as she progresses the treatments. She also experienced some intermittent diarrhea and some dysuria which was helped with iridium  Plan: The patient has completed radiation treatment. The patient will return to radiation oncology clinic for routine followup in one month. I advised them to call or return sooner if they have any questions or concerns related to their recovery or treatment.  -----------------------------------  Billie Lade, PhD, MD

## 2013-05-28 ENCOUNTER — Telehealth: Payer: Self-pay | Admitting: Oncology

## 2013-05-28 ENCOUNTER — Telehealth: Payer: Self-pay

## 2013-05-28 ENCOUNTER — Telehealth: Payer: Self-pay | Admitting: *Deleted

## 2013-05-28 NOTE — Telephone Encounter (Signed)
Per scheduler I have rescheduled 6/25 to 7/10.

## 2013-05-28 NOTE — Telephone Encounter (Signed)
gave pt appt to Adele Dan to call pt, lab,MD and chemo has been r/s to 7/10

## 2013-05-28 NOTE — Telephone Encounter (Signed)
[name] follow up appointment with covering provider to set up next treatment. She will be back in town 06-01-13.  Told her that an order will be sent to the schedulers to arrange appointment. Pt. Verbalized understanding.

## 2013-06-01 ENCOUNTER — Ambulatory Visit: Payer: Medicaid Other

## 2013-06-04 ENCOUNTER — Ambulatory Visit: Payer: Medicaid Other

## 2013-06-04 ENCOUNTER — Telehealth: Payer: Self-pay | Admitting: Hematology and Oncology

## 2013-06-04 ENCOUNTER — Other Ambulatory Visit: Payer: Medicaid Other | Admitting: Lab

## 2013-06-04 NOTE — Telephone Encounter (Signed)
Pt lmonvm today that she would not be able to keep appts today. appt was for lb/fu/tx. appt for lb/fu had already been r/s. S/w desk nurse (LL) re tx appt. Per desk nurse r/s lb/fu only pt can get back on tx schedule once she had seen provider. lmonvm for pt re appt for lb/fu 7/17 @ 11:30am and the any additional appts will be scheduled after pt see doctor. Schedule mailed.

## 2013-06-05 ENCOUNTER — Telehealth: Payer: Self-pay | Admitting: *Deleted

## 2013-06-05 NOTE — Telephone Encounter (Signed)
Per staff message and POF I have scheduled appts.  JMW  

## 2013-06-08 ENCOUNTER — Other Ambulatory Visit: Payer: Self-pay

## 2013-06-08 DIAGNOSIS — C541 Malignant neoplasm of endometrium: Secondary | ICD-10-CM

## 2013-06-08 MED ORDER — DEXAMETHASONE 4 MG PO TABS: ORAL_TABLET | ORAL | Status: DC

## 2013-06-11 ENCOUNTER — Telehealth: Payer: Self-pay | Admitting: *Deleted

## 2013-06-11 ENCOUNTER — Ambulatory Visit (HOSPITAL_BASED_OUTPATIENT_CLINIC_OR_DEPARTMENT_OTHER): Payer: Medicaid Other

## 2013-06-11 ENCOUNTER — Encounter: Payer: Self-pay | Admitting: Pharmacist

## 2013-06-11 ENCOUNTER — Ambulatory Visit (HOSPITAL_BASED_OUTPATIENT_CLINIC_OR_DEPARTMENT_OTHER): Payer: Medicaid Other | Admitting: Hematology and Oncology

## 2013-06-11 ENCOUNTER — Other Ambulatory Visit (HOSPITAL_BASED_OUTPATIENT_CLINIC_OR_DEPARTMENT_OTHER): Payer: Medicaid Other | Admitting: Lab

## 2013-06-11 ENCOUNTER — Telehealth: Payer: Self-pay | Admitting: Hematology and Oncology

## 2013-06-11 VITALS — BP 120/77 | HR 108 | Temp 98.1°F | Resp 18 | Ht 66.0 in | Wt 278.3 lb

## 2013-06-11 DIAGNOSIS — E876 Hypokalemia: Secondary | ICD-10-CM

## 2013-06-11 DIAGNOSIS — Z5111 Encounter for antineoplastic chemotherapy: Secondary | ICD-10-CM

## 2013-06-11 DIAGNOSIS — C549 Malignant neoplasm of corpus uteri, unspecified: Secondary | ICD-10-CM

## 2013-06-11 DIAGNOSIS — M549 Dorsalgia, unspecified: Secondary | ICD-10-CM

## 2013-06-11 DIAGNOSIS — C541 Malignant neoplasm of endometrium: Secondary | ICD-10-CM

## 2013-06-11 DIAGNOSIS — K5900 Constipation, unspecified: Secondary | ICD-10-CM

## 2013-06-11 LAB — CBC WITH DIFFERENTIAL/PLATELET
BASO%: 0 % (ref 0.0–2.0)
EOS%: 0 % (ref 0.0–7.0)
HCT: 41.8 % (ref 34.8–46.6)
LYMPH%: 9.8 % — ABNORMAL LOW (ref 14.0–49.7)
MCH: 31.3 pg (ref 25.1–34.0)
MCHC: 33.7 g/dL (ref 31.5–36.0)
MONO#: 0 10*3/uL — ABNORMAL LOW (ref 0.1–0.9)
NEUT%: 89.6 % — ABNORMAL HIGH (ref 38.4–76.8)
RBC: 4.51 10*6/uL (ref 3.70–5.45)
WBC: 5.4 10*3/uL (ref 3.9–10.3)
lymph#: 0.5 10*3/uL — ABNORMAL LOW (ref 0.9–3.3)
nRBC: 0 % (ref 0–0)

## 2013-06-11 LAB — COMPREHENSIVE METABOLIC PANEL (CC13)
ALT: 26 U/L (ref 0–55)
AST: 15 U/L (ref 5–34)
Alkaline Phosphatase: 73 U/L (ref 40–150)
BUN: 12.8 mg/dL (ref 7.0–26.0)
Chloride: 102 mEq/L (ref 98–109)
Creatinine: 1 mg/dL (ref 0.6–1.1)

## 2013-06-11 MED ORDER — DIPHENHYDRAMINE HCL 50 MG/ML IJ SOLN
50.0000 mg | Freq: Once | INTRAMUSCULAR | Status: AC
  Administered 2013-06-11: 50 mg via INTRAVENOUS

## 2013-06-11 MED ORDER — SODIUM CHLORIDE 0.9 % IV SOLN
Freq: Once | INTRAVENOUS | Status: AC
  Administered 2013-06-11: 15:00:00 via INTRAVENOUS

## 2013-06-11 MED ORDER — FAMOTIDINE IN NACL 20-0.9 MG/50ML-% IV SOLN: 20.0000 mg | Freq: Once | INTRAVENOUS | Status: DC

## 2013-06-11 MED ORDER — HYDROCODONE-ACETAMINOPHEN 5-325 MG PO TABS: ORAL_TABLET | ORAL | Status: DC

## 2013-06-11 MED ORDER — ONDANSETRON 16 MG/50ML IVPB (CHCC)
16.0000 mg | Freq: Once | INTRAVENOUS | Status: AC
  Administered 2013-06-11: 16 mg via INTRAVENOUS

## 2013-06-11 MED ORDER — SODIUM CHLORIDE 0.9 % IV SOLN
50.0000 mg | Freq: Once | INTRAVENOUS | Status: AC
  Administered 2013-06-11: 50 mg via INTRAVENOUS
  Filled 2013-06-11: qty 2

## 2013-06-11 MED ORDER — DEXAMETHASONE SODIUM PHOSPHATE 20 MG/5ML IJ SOLN
20.0000 mg | Freq: Once | INTRAMUSCULAR | Status: AC
  Administered 2013-06-11: 20 mg via INTRAVENOUS

## 2013-06-11 MED ORDER — PACLITAXEL CHEMO INJECTION 300 MG/50ML
175.0000 mg/m2 | Freq: Once | INTRAVENOUS | Status: AC
  Administered 2013-06-11: 432 mg via INTRAVENOUS
  Filled 2013-06-11: qty 72

## 2013-06-11 MED ORDER — SODIUM CHLORIDE 0.9 % IV SOLN
750.0000 mg | Freq: Once | INTRAVENOUS | Status: AC
  Administered 2013-06-11: 750 mg via INTRAVENOUS
  Filled 2013-06-11: qty 75

## 2013-06-11 NOTE — Telephone Encounter (Signed)
gv and printed appt sched and avs forj pt...emialed MW to add tx.Marland KitchenMarland Kitchen

## 2013-06-11 NOTE — Telephone Encounter (Signed)
Per staff message and POF I have scheduled appts.  JMW  

## 2013-06-11 NOTE — Progress Notes (Signed)
OFFICE PROGRESS NOTE   06/11/2013   Physicians:W.Brewster, J.Kinard, M.Billey Co (PCP [facility]) J.Hochrein, Reino Bellis   INTERVAL HISTORY:   Patient is seen, alone for visit, in continuing attention to IIIC grade 2 endometrial cancer, now s/p radiation by Dr Roselind Messier, with plans to complete addittional 3 cycle of taxol carboplatin after RT. She tolerated radiation well; other than low back discomfort from positioning on that table and anxiety/ panic attack due to that. She has chronic low back pain which has been much more symptomatic thru radiation, was seen in ED for this on 03-31-13 with plain films showing degenerative changes and MRI LS by Dr Reino Bellis pending. She does not have PAC.  Patient presented with spotting and menorrhagia, last PAP 2 years prior. Biopsies by Dr Nicholaus Bloom 09-22-2012 (path [id number]) showed endometroid adenocarcinoma in endometrial biopsy and in endocervical curettage. She went to robotic hysterectomy with BSO by Dr Nelly Rout on 10-07-12 (no lymph nodes sampled as serosal cysts appeared malignant). Pathology [id number]) showed grade 2 endometroid adenocarcinoma with involvement of < 1/2 of myometrium, no LVSI, did involve into lower uterine segment, ovaries and tubes benign and the serosal cysts benign. CT AP 11-25-12 showed shotty bilateral external iliac nodes up to 1.3 cm and nonspecific < 5 mm left para-aortic nodes. She saw Dr Nelly Rout following the CT, with recommendation for chemotherapy and radiation in sandwich fashion. Patient cancelled her new patient visit with me in Jan, that rescheduled. She had consultation visit with Dr Roselind Messier on 12-29-12, with recommendation for pelvic radiation and intracavitary brachytherapy after 3 cycles of chemotherapy. Cycle 1 taxol/carboplatin was given 01-07-13 and cycle 3 completed 02-18-13. IMRT began ~ 04-07-13. Plan is for 3 additional cycles of chemo after RT, then repeat CT AP and follow up with Dr  Nelly Rout.    She is complaining of occasional muscle pain and back pain. No fever, no N/V, no significant peripheral neuropathy symptoms, some fatigue. Remainder of 10 point Review of Systems negative.  She is reluctant to have chemo resumed after RT completes 6-18, but I have encouraged her to get the treatments completed. She does have transportation service for treatments and visits.  Objective:  Vital signs in last 24 hours:  BP 120/77  Pulse 108  Temp(Src) 98.1 F (36.7 C) (Oral)  Resp 18  Ht 5\' 6"  (1.676 m)  Wt 278 lb 4.8 oz (126.236 kg)  BMI 44.94 kg/m2  LMP 09/20/2012  Weight is up 5 lbs. Ambulatory without assistance, does not tolerate flat on exam table.   HEENT:PERRLA, sclera clear, anicteric and oropharynx clear, no lesions LymphaticsCervical, supraclavicular, and axillary nodes normal. No inguinal adenopathy Resp: clear to auscultation bilaterally and normal percussion bilaterally Cardio: regular rate and rhythm GI: obese, soft, not tender, normal BS, hyperpigmentation in RT field without skin breakdown. Back without spinal tenderness to palpation, no rash Extremities: no edema, cords, tenderness. Congenital defect right hand Neuro:no sensory deficits noted Skin without rash or ecchymosis No central catheter  Lab Results:  Results for orders placed in visit on 06/11/13  CBC WITH DIFFERENTIAL      Result Value Range   WBC 5.4  3.9 - 10.3 10e3/uL   NEUT# 4.8  1.5 - 6.5 10e3/uL   HGB 14.1  11.6 - 15.9 g/dL   HCT 40.9  81.1 - 91.4 %   Platelets 251  145 - 400 10e3/uL   MCV 92.7  79.5 - 101.0 fL   MCH 31.3  25.1 - 34.0 pg  MCHC 33.7  31.5 - 36.0 g/dL   RBC 1.61  0.96 - 0.45 10e6/uL   RDW 14.1  11.2 - 14.5 %   lymph# 0.5 (*) 0.9 - 3.3 10e3/uL   MONO# 0.0 (*) 0.1 - 0.9 10e3/uL   Eosinophils Absolute 0.0  0.0 - 0.5 10e3/uL   Basophils Absolute 0.0  0.0 - 0.1 10e3/uL   NEUT% 89.6 (*) 38.4 - 76.8 %   LYMPH% 9.8 (*) 14.0 - 49.7 %   MONO% 0.6  0.0 - 14.0 %    EOS% 0.0  0.0 - 7.0 %   BASO% 0.0  0.0 - 2.0 %   nRBC 0  0 - 0 %     Studies/Results: LUMBAR SPINE - COMPLETE 4+ VIEW 03-31-13 Comparison: None.  Findings: Five views of the lumbar spine submitted. Moderate  anterior spurring noted at L3-L4 level. Moderate anterior spurring  and mild disc space flattening at L5 S1 level. Mild anterior  spurring at L1 - L2 level. Alignment and vertebral heights are  preserved.  IMPRESSION:  No acute fracture or subluxation. Degenerative changes as  described above.   LEFT HIP - COMPLETE 2+ VIEW  03-31-13 Comparison: None.  Findings: Three views of the left hip submitted. No acute fracture  or subluxation. Pelvic phleboliths are noted.  IMPRESSION:  No acute fracture or subluxation   Medications: I have reviewed the patient's current medications. Prescription for valium 5 mg # 14 to use for back spasm or severe anxiety with RT.  Assessment/Plan:  1.IIIC endometrial carcinoma: post hysterectomy/BSO 10-07-12 with serosal cysts benign but adenopathy by subsequent CT. Taxol/carboplatin x 3 cycles from 01-07-13 thru 02-18-13 with neulasta.  IMRT by Dr Roselind Messier to complete 05-13-13, then plan was for 3 additional cycles of chemotherapy. Patient was supposed to be seen on 05-18-13 by Dr. Darrold Span and to have cycle 4 taxol carbo on 05-20-13. However, patient idn't show up to her clinic visit and cancelled her chemo appt since she was out of town, and she is here to day to reinitiate her treament. Therefore we will start cycle 4 today 06/11/2013 and plan for cycle 5 in 3 weeks.   2. Increase in usual chronic low back pain, seems exacerbated by positioning for RT. Plain films a above, MRI pending. Valium #14 tablets today, but would not continue to prescribe this without rest of evaluation pending.  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 4.constipation improved, continue laxatives  5.degenerative arthritis  6.morbid obesity:  ongoing education by dietician  7.hypokalemia: continue oral potassium and follow. Will decrease to 10 mEq daily as she has difficulty swallowing 20 mEq tablets. Hopefully will improve when RT diarrhea resolves  Patient understands plan and is in agreement.  Zachery Dakins, MD   06/11/2013, 12:24 PM

## 2013-06-11 NOTE — Patient Instructions (Addendum)
[facility] Discharge Instructions for Patients Receiving Chemotherapy  Today you received the following chemotherapy agents Taxol/Carboplatin To help prevent nausea and vomiting after your treatment, we encourage you to take your nausea medication as prescribed.  If you develop nausea and vomiting that is not controlled by your nausea medication, call the clinic.   BELOW ARE SYMPTOMS THAT SHOULD BE REPORTED IMMEDIATELY:  *FEVER GREATER THAN 100.5 F  *CHILLS WITH OR WITHOUT FEVER  NAUSEA AND VOMITING THAT IS NOT CONTROLLED WITH YOUR NAUSEA MEDICATION  *UNUSUAL SHORTNESS OF BREATH  *UNUSUAL BRUISING OR BLEEDING  TENDERNESS IN MOUTH AND THROAT WITH OR WITHOUT PRESENCE OF ULCERS  *URINARY PROBLEMS  *BOWEL PROBLEMS  UNUSUAL RASH Items with * indicate a potential emergency and should be followed up as soon as possible.  Feel free to call the clinic you have any questions or concerns. The clinic phone number is [phone].    

## 2013-06-14 ENCOUNTER — Telehealth: Payer: Self-pay | Admitting: Internal Medicine

## 2013-06-14 NOTE — Telephone Encounter (Signed)
Patient called because of generalized body aches and pains.  She states that she is on carbo/taxol for uterine cancer.  She denies bone support agents.  She is compliant with 2 extra strength tylenol and tramadol every 8 hours as needed. She has not filled the prescription for hydrocodone.   PLAN: I recommended filling the prescription for hydrocodone/acetaminophen as soon as possible and to take 2 tabs every six hours as needed for pain.   Since she reports minimal relief with tramadol and headaches following taking it, I recommended to to stop taking it.  Also I advised her to not take tyelenol while taking the hydrocodone/acetaminophen to avoid toxicity.  She will follow up with her primary oncologist as needed.

## 2013-06-15 ENCOUNTER — Telehealth: Payer: Self-pay | Admitting: *Deleted

## 2013-06-15 NOTE — Telephone Encounter (Signed)
Pt called to move her injection appt to after her ov with Kinard. gv appt d/t for 06/18/13 @ 11am...td

## 2013-06-16 ENCOUNTER — Encounter: Payer: Self-pay | Admitting: Radiation Oncology

## 2013-06-16 DIAGNOSIS — Z923 Personal history of irradiation: Secondary | ICD-10-CM | POA: Insufficient documentation

## 2013-06-18 ENCOUNTER — Ambulatory Visit
Admission: RE | Admit: 2013-06-18 | Discharge: 2013-06-18 | Disposition: A | Discharge status: Home / Self Care | Payer: Medicaid Other | Source: Ambulatory Visit | Attending: Radiation Oncology | Admitting: Radiation Oncology

## 2013-06-18 ENCOUNTER — Ambulatory Visit (HOSPITAL_BASED_OUTPATIENT_CLINIC_OR_DEPARTMENT_OTHER): Payer: Medicaid Other

## 2013-06-18 ENCOUNTER — Encounter: Payer: Self-pay | Admitting: Radiation Oncology

## 2013-06-18 ENCOUNTER — Ambulatory Visit: Payer: Medicaid Other

## 2013-06-18 VITALS — BP 120/89 | HR 101 | Temp 99.0°F | Resp 20 | Wt 274.4 lb

## 2013-06-18 VITALS — BP 111/47 | HR 87 | Temp 98.9°F | Resp 20

## 2013-06-18 DIAGNOSIS — C541 Malignant neoplasm of endometrium: Secondary | ICD-10-CM

## 2013-06-18 DIAGNOSIS — C549 Malignant neoplasm of corpus uteri, unspecified: Secondary | ICD-10-CM

## 2013-06-18 MED ORDER — PEGFILGRASTIM INJECTION 6 MG/0.6ML
6.0000 mg | Freq: Once | SUBCUTANEOUS | Status: AC
  Administered 2013-06-18: 6 mg via SUBCUTANEOUS
  Filled 2013-06-18: qty 0.6

## 2013-06-18 NOTE — Addendum Note (Signed)
Encounter addended by: Lowella Petties, RN on: 06/18/2013 11:44 AM<BR>     Documentation filed: Notes Section

## 2013-06-18 NOTE — Progress Notes (Addendum)
Pt c/o generalized aches, fatigue she attributes to chemo 1 week ago, but she states her symptoms are improving. She denies urinary, bowel issues, vaginal discharge, loss of appetite.  Per Dr.Kinard, gave pt small+ and a medium dilator with 2 tubes surgilube, went over directions of using on MON,WED and Fr nights, all questions answered 11:44 AM

## 2013-06-18 NOTE — Progress Notes (Addendum)
Radiation Oncology         [phone] ________________________________  Name: Deborah [name] MRN: [id number]  Date: 06/18/2013  DOB: November 28, 1965  Follow-Up Visit Note  CC: Marikay Alar, MD  Glori Luis, MD  Diagnosis:   Stage IIIC-I endometrial cancer  Interval Since Last Radiation:  1  months  Narrative:  The patient returns today for routine follow-up.  She is having some fatigue and body aches in light of her recent chemotherapy treatment. Patient's diarrhea has cleared up this time. She denies any rectal bleeding or hematuria or urination difficulties. Patient denies any vaginal bleeding                              ALLERGIES:  has No Known Allergies.  Meds: Current Outpatient Prescriptions  Medication Sig Dispense Refill  . aspirin 81 MG tablet Take 81 mg by mouth every morning.       . bifidobacterium infantis (ALIGN) capsule Take 1 capsule by mouth daily as needed.      . bismuth subsalicylate (PEPTO BISMOL) 262 MG chewable tablet Chew 524 mg by mouth as needed for indigestion.      [name] dexamethasone (DECADRON) 4 MG tablet Take 5 tablets with food 12 hrs. and 6 hrs. Prior to Taxol chemotherapy.  30 tablet  0  . diazepam (VALIUM) 5 MG tablet Take 1 tablet (5 mg total) by mouth every 8 (eight) hours as needed for anxiety. 1 daily prn back pain or anxiety prior to radiation therapy  30 tablet  0  . diclofenac (VOLTAREN) 75 MG EC tablet Take 1 tablet (75 mg total) by mouth 2 (two) times daily.  30 tablet  0  . emollient (BIAFINE) cream Apply 1 application topically 2 (two) times daily. Apply to affected area after rad txs and bedtime , not 4 hours prior to treatments      . ferrous sulfate 325 (65 FE) MG tablet 325 mg every evening. Takes twice a week      . furosemide (LASIX) 20 MG tablet Take 1 tablet (20 mg total) by mouth every morning.  30 tablet  3  . HYDROcodone-acetaminophen (NORCO/VICODIN) 5-325 MG per tablet Take 1-2 tablets every 6 hours as needed for severe  pain  20 tablet  0  . lactase (LACTAID) 3000 UNITS tablet Take 1 tablet by mouth 3 (three) times daily with meals as needed.      . loperamide (IMODIUM) 2 MG capsule Take 2 mg by mouth 4 (four) times daily as needed for diarrhea or loose stools.      [name] loratadine (CLARITIN) 10 MG tablet Take 10 mg by mouth daily.      [name] LORazepam (ATIVAN) 1 MG tablet Take 1 tablet (1 mg total) by mouth every 8 (eight) hours.  30 tablet  0  . ondansetron (ZOFRAN) 8 MG tablet Take 1-2 tablets (8-16 mg total) by mouth every 8 (eight) hours as needed for nausea (Will not make you drowsy).  30 tablet  1  . OVER THE COUNTER MEDICATION Allergy Plus Sinus Headache- acetaminophen 325, diphenhydramine 12.5, phenylephrine 5 mg      . OVER THE COUNTER MEDICATION Medi-phenyl 5 mg      . phenazopyridine (PYRIDIUM) 200 MG tablet Take 1 tablet (200 mg total) by mouth 3 (three) times daily as needed for pain.  30 tablet  0  . polyethylene glycol powder (MIRALAX) powder Take 17 g by mouth 2 (  two) times daily as needed.  255 g  2  . potassium chloride (K-DUR) 10 MEQ tablet Take 1 tablet (10 mEq total) by mouth daily.  30 tablet  2  . PRESCRIPTION MEDICATION PT STATES SHE HAD HER LAST TREATMENT ON 02-18-13 AND AT THIS TIME PT IS NOT SCHEDULED FOR ANOTHER TREATMENT PENDING HER RADIATION AND CT SCAN, SHE IS FOLLOWED BY DR LIVESAY.      [name] prochlorperazine (COMPAZINE) 10 MG tablet Take 1 tablet (10 mg total) by mouth every 6 (six) hours as needed.  20 tablet  0  . sertraline (ZOLOFT) 100 MG tablet Take 100 mg by mouth daily.      . traMADol (ULTRAM) 50 MG tablet Take 1 tablet (50 mg total) by mouth every 8 (eight) hours as needed for pain.  30 tablet  2   No current facility-administered medications for this encounter.    Physical Findings: The patient is in no acute distress. Patient is alert and oriented.  weight is 274 lb 6.4 oz (124.467 kg). Her oral temperature is 99 F (37.2 C). Her blood pressure is 120/89 and her pulse is 101.  Her respiration is 20. [name] No palpable supraclavicular adenopathy. The lungs are clear to auscultation. The heart has a regular rhythm and rate. The abdomen is soft and nontender with normal bowel sounds. A pelvic exam is not performed in light of patient's recent completion of treatment.  Lab Findings: Lab Results  Component Value Date   WBC 5.4 06/11/2013   HGB 14.1 06/11/2013   HCT 41.8 06/11/2013   MCV 92.7 06/11/2013   PLT 251 06/11/2013      Radiographic Findings: No results found.  Impression:  The patient is recovering from the effects of radiation.    Plan:  Routine followup in January. Patient will see Dr. Nelly Rout in October for pelvic exam and Pap smear. Today the patient was given a vaginal dilator and instructions on its use  _____________________________________  -----------------------------------  Billie Lade,

## 2013-07-02 ENCOUNTER — Ambulatory Visit (HOSPITAL_BASED_OUTPATIENT_CLINIC_OR_DEPARTMENT_OTHER): Payer: Medicaid Other

## 2013-07-02 ENCOUNTER — Other Ambulatory Visit: Payer: Self-pay | Admitting: Hematology and Oncology

## 2013-07-02 ENCOUNTER — Other Ambulatory Visit: Payer: Self-pay

## 2013-07-02 ENCOUNTER — Telehealth: Payer: Self-pay | Admitting: Hematology and Oncology

## 2013-07-02 ENCOUNTER — Other Ambulatory Visit (HOSPITAL_BASED_OUTPATIENT_CLINIC_OR_DEPARTMENT_OTHER): Payer: Medicaid Other | Admitting: Lab

## 2013-07-02 ENCOUNTER — Ambulatory Visit (HOSPITAL_BASED_OUTPATIENT_CLINIC_OR_DEPARTMENT_OTHER): Payer: Medicaid Other | Admitting: Physician Assistant

## 2013-07-02 ENCOUNTER — Encounter: Payer: Self-pay | Admitting: Nutrition

## 2013-07-02 VITALS — BP 122/85 | HR 103 | Temp 98.2°F | Resp 18 | Ht 66.0 in | Wt 277.8 lb

## 2013-07-02 DIAGNOSIS — R6889 Other general symptoms and signs: Secondary | ICD-10-CM

## 2013-07-02 DIAGNOSIS — M545 Low back pain: Secondary | ICD-10-CM

## 2013-07-02 DIAGNOSIS — C549 Malignant neoplasm of corpus uteri, unspecified: Secondary | ICD-10-CM

## 2013-07-02 DIAGNOSIS — E876 Hypokalemia: Secondary | ICD-10-CM

## 2013-07-02 DIAGNOSIS — Z5111 Encounter for antineoplastic chemotherapy: Secondary | ICD-10-CM

## 2013-07-02 DIAGNOSIS — C541 Malignant neoplasm of endometrium: Secondary | ICD-10-CM

## 2013-07-02 DIAGNOSIS — M1990 Unspecified osteoarthritis, unspecified site: Secondary | ICD-10-CM

## 2013-07-02 LAB — COMPREHENSIVE METABOLIC PANEL (CC13)
Albumin: 3.2 g/dL — ABNORMAL LOW (ref 3.5–5.0)
Alkaline Phosphatase: 75 U/L (ref 40–150)
BUN: 15.4 mg/dL (ref 7.0–26.0)
Creatinine: 0.9 mg/dL (ref 0.6–1.1)
Glucose: 242 mg/dl — ABNORMAL HIGH (ref 70–140)
Potassium: 3.4 mEq/L — ABNORMAL LOW (ref 3.5–5.1)
Total Bilirubin: 0.22 mg/dL (ref 0.20–1.20)

## 2013-07-02 LAB — CBC WITH DIFFERENTIAL/PLATELET
BASO%: 0 % (ref 0.0–2.0)
Basophils Absolute: 0 10*3/uL (ref 0.0–0.1)
EOS%: 0 % (ref 0.0–7.0)
HCT: 39.1 % (ref 34.8–46.6)
HGB: 13.2 g/dL (ref 11.6–15.9)
LYMPH%: 10.7 % — ABNORMAL LOW (ref 14.0–49.7)
MCH: 30.8 pg (ref 25.1–34.0)
MCHC: 33.8 g/dL (ref 31.5–36.0)
MCV: 91.1 fL (ref 79.5–101.0)
MONO%: 0.5 % (ref 0.0–14.0)
NEUT%: 88.8 % — ABNORMAL HIGH (ref 38.4–76.8)
Platelets: 210 10*3/uL (ref 145–400)

## 2013-07-02 MED ORDER — PACLITAXEL CHEMO INJECTION 300 MG/50ML
175.0000 mg/m2 | Freq: Once | INTRAVENOUS | Status: AC
  Administered 2013-07-02: 432 mg via INTRAVENOUS
  Filled 2013-07-02: qty 72

## 2013-07-02 MED ORDER — DIPHENHYDRAMINE HCL 50 MG/ML IJ SOLN
50.0000 mg | Freq: Once | INTRAMUSCULAR | Status: AC
  Administered 2013-07-02: 50 mg via INTRAVENOUS

## 2013-07-02 MED ORDER — SODIUM CHLORIDE 0.9 % IV SOLN
Freq: Once | INTRAVENOUS | Status: AC
  Administered 2013-07-02: 13:00:00 via INTRAVENOUS

## 2013-07-02 MED ORDER — HYDROCODONE-ACETAMINOPHEN 5-325 MG PO TABS: ORAL_TABLET | ORAL | Status: DC

## 2013-07-02 MED ORDER — ONDANSETRON 16 MG/50ML IVPB (CHCC)
16.0000 mg | Freq: Once | INTRAVENOUS | Status: AC
  Administered 2013-07-02: 16 mg via INTRAVENOUS

## 2013-07-02 MED ORDER — FAMOTIDINE IN NACL 20-0.9 MG/50ML-% IV SOLN
20.0000 mg | Freq: Once | INTRAVENOUS | Status: AC
  Administered 2013-07-02: 20 mg via INTRAVENOUS

## 2013-07-02 MED ORDER — PEGFILGRASTIM INJECTION 6 MG/0.6ML
6.0000 mg | Freq: Once | SUBCUTANEOUS | Status: DC
  Filled 2013-07-02: qty 0.6

## 2013-07-02 MED ORDER — DEXAMETHASONE SODIUM PHOSPHATE 20 MG/5ML IJ SOLN
20.0000 mg | Freq: Once | INTRAMUSCULAR | Status: AC
  Administered 2013-07-02: 20 mg via INTRAVENOUS

## 2013-07-02 MED ORDER — SODIUM CHLORIDE 0.9 % IV SOLN
750.0000 mg | Freq: Once | INTRAVENOUS | Status: AC
  Administered 2013-07-02: 750 mg via INTRAVENOUS
  Filled 2013-07-02: qty 75

## 2013-07-02 NOTE — Telephone Encounter (Signed)
gv and printed appt sched and avs for pt...emailed MB to add tx.   °

## 2013-07-02 NOTE — Progress Notes (Signed)
Patient requested that I come and speak with her during chemotherapy.  She reports she is doing well.  She continues to eat and her weight is stable overall.  She is requesting Ensure Plus samples, as she relies on this product when her appetite decreases.  I provided patient with her second case of Ensure Plus at her request.  Education provided for weight maintenance.  Patient was encouraged to contact me with further needs or concerns.

## 2013-07-02 NOTE — Progress Notes (Signed)
1840 Patient discharged ambulatory with [employer].

## 2013-07-02 NOTE — Telephone Encounter (Signed)
gv and printed appt sched and avs for pt...emaield MB to add tx...emailed AJ regarding to inj for 8.15.14.Marland KitchenMarland Kitchen

## 2013-07-02 NOTE — Patient Instructions (Addendum)
[facility] Discharge Instructions for Patients Receiving Chemotherapy  Today you received the following chemotherapy agents taxol and carboplatin.  To help prevent nausea and vomiting after your treatment, we encourage you to take your nausea medication compazine and zofran.   If you develop nausea and vomiting that is not controlled by your nausea medication, call the clinic.   BELOW ARE SYMPTOMS THAT SHOULD BE REPORTED IMMEDIATELY:  *FEVER GREATER THAN 100.5 F  *CHILLS WITH OR WITHOUT FEVER  NAUSEA AND VOMITING THAT IS NOT CONTROLLED WITH YOUR NAUSEA MEDICATION  *UNUSUAL SHORTNESS OF BREATH  *UNUSUAL BRUISING OR BLEEDING  TENDERNESS IN MOUTH AND THROAT WITH OR WITHOUT PRESENCE OF ULCERS  *URINARY PROBLEMS  *BOWEL PROBLEMS  UNUSUAL RASH Items with * indicate a potential emergency and should be followed up as soon as possible.  Feel free to call the clinic you have any questions or concerns. The clinic phone number is [phone].

## 2013-07-02 NOTE — Patient Instructions (Addendum)
Continue weekly labs as scheduled Follow up in 3 weeks prior to the start of your next scheduled cycle of chemotherapy. 

## 2013-07-05 NOTE — Progress Notes (Signed)
OFFICE PROGRESS NOTE   07/02/2013  Physicians:W.Brewster, J.Kinard, M.Billey Co (PCP [facility]) J.Hochrein, Reino Bellis   INTERVAL HISTORY:   Patient is seen, alone for visit, in continuing attention to IIIC grade 2 endometrial cancer, now s/p radiation by Dr Roselind Messier, with plans to complete addittional 3 cycle of taxol carboplatin after RT. She tolerated radiation well; other than low back discomfort from positioning on that table and anxiety/ panic attack due to that. She has chronic low back pain which has been much more symptomatic thru radiation, was seen in ED for this on 03-31-13 with plain films showing degenerative changes and MRI LS by Dr Reino Bellis that was negative for metastatic disease. She requests a prescription for Vicodin to deal with the aches related to chemotherapy. She does have some issues with constipation and we discussed measures that she can take 2 keep her bowels moving including MiraLAX and Senokot S. She does not have PAC.  Patient presented with spotting and menorrhagia, last PAP 2 years prior. Biopsies by Dr Nicholaus Bloom 09-22-2012 (path [id number]) showed endometroid adenocarcinoma in endometrial biopsy and in endocervical curettage. She went to robotic hysterectomy with BSO by Dr Nelly Rout on 10-07-12 (no lymph nodes sampled as serosal cysts appeared malignant). Pathology [id number]) showed grade 2 endometroid adenocarcinoma with involvement of < 1/2 of myometrium, no LVSI, did involve into lower uterine segment, ovaries and tubes benign and the serosal cysts benign. CT AP 11-25-12 showed shotty bilateral external iliac nodes up to 1.3 cm and nonspecific < 5 mm left para-aortic nodes. She saw Dr Nelly Rout following the CT, with recommendation for chemotherapy and radiation in sandwich fashion. Patient cancelled her new patient visit with me in Jan, that rescheduled. She had consultation visit with Dr Roselind Messier on 12-29-12, with recommendation for pelvic radiation  and intracavitary brachytherapy after 3 cycles of chemotherapy. Cycle 1 taxol/carboplatin was given 01-07-13 and cycle 3 completed 02-18-13. IMRT began ~ 04-07-13. Plan is for 3 additional cycles of chemo after RT, then repeat CT AP and follow up with Dr Nelly Rout.    She is complaining of occasional muscle pain and back pain. No fever, no N/V, no significant peripheral neuropathy symptoms, some fatigue. Remainder of 10 point Review of Systems negative.  She is reluctant to have chemo resumed after RT completes 6-18, but I have encouraged her to get the treatments completed. She does have transportation service for treatments and visits.  Objective:  Vital signs in last 24 hours:  BP 122/85  Pulse 103  Temp(Src) 98.2 F (36.8 C) (Oral)  Resp 18  Ht 5\' 6"  (1.676 m)  Wt 277 lb 12.8 oz (126.009 kg)  BMI 44.86 kg/m2  LMP 09/20/2012  Weight is up 5 lbs. Ambulatory without assistance, does not tolerate flat on exam table.   HEENT:PERRLA, sclera clear, anicteric and oropharynx clear, no lesions LymphaticsCervical, supraclavicular, and axillary nodes normal. No inguinal adenopathy Resp: clear to auscultation bilaterally and normal percussion bilaterally Cardio: regular rate and rhythm GI: obese, soft, not tender, normal BS, hyperpigmentation in RT field without skin breakdown. Back without spinal tenderness to palpation, no rash Extremities: no edema, cords, tenderness. Congenital defect right hand Neuro:no sensory deficits noted Skin without rash or ecchymosis No central catheter  Lab Results:  Results for orders placed in visit on 07/02/13  CBC WITH DIFFERENTIAL      Result Value Range   WBC 3.8 (*) 3.9 - 10.3 10e3/uL   NEUT# 3.3  1.5 - 6.5 10e3/uL   HGB 13.2  11.6 - 15.9 g/dL   HCT 16.1  09.6 - 04.5 %   Platelets 210  145 - 400 10e3/uL   MCV 91.1  79.5 - 101.0 fL   MCH 30.8  25.1 - 34.0 pg   MCHC 33.8  31.5 - 36.0 g/dL   RBC 4.09  8.11 - 9.14 10e6/uL   RDW 14.2  11.2 - 14.5 %    lymph# 0.4 (*) 0.9 - 3.3 10e3/uL   MONO# 0.0 (*) 0.1 - 0.9 10e3/uL   Eosinophils Absolute 0.0  0.0 - 0.5 10e3/uL   Basophils Absolute 0.0  0.0 - 0.1 10e3/uL   NEUT% 88.8 (*) 38.4 - 76.8 %   LYMPH% 10.7 (*) 14.0 - 49.7 %   MONO% 0.5  0.0 - 14.0 %   EOS% 0.0  0.0 - 7.0 %   BASO% 0.0  0.0 - 2.0 %   nRBC 0  0 - 0 %  COMPREHENSIVE METABOLIC PANEL (CC13)      Result Value Range   Sodium 138  136 - 145 mEq/L   Potassium 3.4 (*) 3.5 - 5.1 mEq/L   Chloride 102  98 - 109 mEq/L   CO2 22  22 - 29 mEq/L   Glucose 242 (*) 70 - 140 mg/dl   BUN 78.2  7.0 - 95.6 mg/dL   Creatinine 0.9  0.6 - 1.1 mg/dL   Total Bilirubin 2.13  0.20 - 1.20 mg/dL   Alkaline Phosphatase 75  40 - 150 U/L   AST 11  5 - 34 U/L   ALT 21  0 - 55 U/L   Total Protein 8.6 (*) 6.4 - 8.3 g/dL   Albumin 3.2 (*) 3.5 - 5.0 g/dL   Calcium 08.6  8.4 - 57.8 mg/dL     Studies/Results:    Medications: I have reviewed the patient's current medications. Prescription for Vicodin 5/325, total 30 tablets called to her pharmacy of record.  Assessment/Plan:  1.IIIC endometrial carcinoma: post hysterectomy/BSO 10-07-12 with serosal cysts benign but adenopathy by subsequent CT. Taxol/carboplatin x 3 cycles from 01-07-13 thru 02-18-13 with neulasta.  IMRT by Dr Roselind Messier to complete 05-13-13, then plan was for 3 additional cycles of chemotherapy. Patient was supposed to be seen on 05-18-13 by Dr. Darrold Span and to have cycle 4 taxol carbo on 05-20-13. However, patient idn't show up to her clinic visit and cancelled her chemo appt since she was out of town, she is now status post cycle #4 and presents today to start cycle #5. Patient was discussed with Dr. Karel Jarvis. She'll continue with weekly labs and return in 3 weeks prior to cycle #6. After cycle #6 we will do another restaging CT scan she will followup with GYN oncology.  2. Increase in usual chronic low back pain, seems exacerbated by positioning for RT. Plain films a above, MRI pending. Valium #14  tablets today, but would not continue to prescribe this without rest of evaluation pending.  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 4.constipation improved, continue laxatives  5.degenerative arthritis  6.morbid obesity: ongoing education by dietician  7.hypokalemia: continue oral potassium and follow. Will decrease to 10 mEq daily as she has difficulty swallowing 20 mEq tablets. Hopefully will improve when RT diarrhea resolves  Patient understands plan and is in agreement.  Laural Benes, Douglas Rooks E, PA-C

## 2013-07-08 ENCOUNTER — Telehealth: Payer: Self-pay

## 2013-07-08 NOTE — Telephone Encounter (Signed)
No note, after 19 minute call about temperature, patient spoke with collaborative nurse.

## 2013-07-08 NOTE — Telephone Encounter (Signed)
Spoke with Deborah [name] and she stated that her temp was 100.1.  She had eaten a bowl of oatmeal prior to temp.  She now has temp of 98.2. She is achy from taxol which is to be expected.  She needs to continue to use her pain med as needed. She is due for neulasta injection tomorrow. She is to call if she has a temp of 100.5 or greater.  Patient verbalized understanding.

## 2013-07-09 ENCOUNTER — Other Ambulatory Visit: Payer: Self-pay | Admitting: Family Medicine

## 2013-07-09 ENCOUNTER — Other Ambulatory Visit: Payer: Self-pay | Admitting: Physician Assistant

## 2013-07-10 ENCOUNTER — Ambulatory Visit: Payer: Medicaid Other

## 2013-07-10 ENCOUNTER — Ambulatory Visit (HOSPITAL_BASED_OUTPATIENT_CLINIC_OR_DEPARTMENT_OTHER): Payer: Medicaid Other

## 2013-07-10 VITALS — BP 104/70 | HR 72 | Temp 99.3°F

## 2013-07-10 DIAGNOSIS — C541 Malignant neoplasm of endometrium: Secondary | ICD-10-CM

## 2013-07-10 DIAGNOSIS — C549 Malignant neoplasm of corpus uteri, unspecified: Secondary | ICD-10-CM

## 2013-07-10 MED ORDER — PEGFILGRASTIM INJECTION 6 MG/0.6ML
6.0000 mg | Freq: Once | SUBCUTANEOUS | Status: AC
  Administered 2013-07-10: 6 mg via SUBCUTANEOUS
  Filled 2013-07-10: qty 0.6

## 2013-07-12 NOTE — Telephone Encounter (Signed)
Will refill with one additional refill. Please advise patient should schedule an appointment to discuss this issue prior to additional refills as it has been over a year since seen for this medication.

## 2013-07-13 ENCOUNTER — Telehealth: Payer: Self-pay | Admitting: *Deleted

## 2013-07-13 ENCOUNTER — Telehealth: Payer: Self-pay | Admitting: Medical Oncology

## 2013-07-13 NOTE — Telephone Encounter (Signed)
Returned call to pt after receiving vm re: Valium refill. Her last refill was 05/05/13. Pt states pharmacy of preference for Valium is [facility], [facility].  Informed her will notify Dr Roselind Messier of her request. Pt verbalized understanding.

## 2013-07-13 NOTE — Telephone Encounter (Signed)
Pt called complaining of a sinus headache. She asking if we can call her in something. I questioned her about her medications and she has a sinus medication without tyelnol that she usually takes. I told her she can take this and her ultram. If she does not imrpove to call me back.

## 2013-07-13 NOTE — Telephone Encounter (Signed)
Left message for pt that medication was sent to pharmacy and that she needed an appt before it would be refilled again.  Jazmin Hartsell,CMA

## 2013-07-14 ENCOUNTER — Telehealth: Payer: Self-pay | Admitting: *Deleted

## 2013-07-14 NOTE — Telephone Encounter (Signed)
Received call from pt inquiring about Valium refill. Had spoken w/Dr Roselind Messier who approved refill called in w/same instructions, disp # as previous prescription. Informed pt refill will be called in to [facility], [place]. Pt verbalized thanks and understanding.   Left vm at [facility] authorizing refill per Dr Trina Ao previous prescription.

## 2013-07-15 ENCOUNTER — Telehealth: Payer: Self-pay | Admitting: Oncology

## 2013-07-15 ENCOUNTER — Ambulatory Visit: Payer: Medicaid Other

## 2013-07-16 ENCOUNTER — Other Ambulatory Visit: Payer: Self-pay | Admitting: Family Medicine

## 2013-07-16 DIAGNOSIS — D249 Benign neoplasm of unspecified breast: Secondary | ICD-10-CM

## 2013-07-17 ENCOUNTER — Encounter: Payer: Self-pay | Admitting: Family Medicine

## 2013-07-17 ENCOUNTER — Ambulatory Visit (INDEPENDENT_AMBULATORY_CARE_PROVIDER_SITE_OTHER): Payer: Self-pay | Admitting: Family Medicine

## 2013-07-17 VITALS — BP 111/75 | HR 102 | Temp 98.3°F | Wt 270.0 lb

## 2013-07-17 DIAGNOSIS — J019 Acute sinusitis, unspecified: Secondary | ICD-10-CM | POA: Insufficient documentation

## 2013-07-17 DIAGNOSIS — Z79899 Other long term (current) drug therapy: Secondary | ICD-10-CM

## 2013-07-17 DIAGNOSIS — R7309 Other abnormal glucose: Secondary | ICD-10-CM

## 2013-07-17 DIAGNOSIS — R739 Hyperglycemia, unspecified: Secondary | ICD-10-CM | POA: Insufficient documentation

## 2013-07-17 LAB — LIPID PANEL
Cholesterol: 162 mg/dL (ref 0–200)
VLDL: 30 mg/dL (ref 0–40)

## 2013-07-17 LAB — POCT GLYCOSYLATED HEMOGLOBIN (HGB A1C): Hemoglobin A1C: 6

## 2013-07-17 MED ORDER — AZITHROMYCIN 250 MG PO TABS: ORAL_TABLET | ORAL | Status: DC

## 2013-07-17 MED ORDER — GUAIFENESIN 200 MG PO TABS: 400.0000 mg | ORAL_TABLET | ORAL | Status: DC | PRN

## 2013-07-17 NOTE — Patient Instructions (Signed)
It was nice to meet you today!   I have sent in a prescription for antibiotics and congestion medication.  If you are not better by next Wednesday, please let us know.  Please follow up in 6 months, or as needed.  Sheena Donegan M. Keeli Roberg, M.D.

## 2013-07-17 NOTE — Progress Notes (Signed)
Patient ID: Deborah [name], female   DOB: 1965/12/24, 47 y.o.   MRN: [id number]  [facility] Shawnise Peterkin M. Jony Ladnier, MD Phone: [phone]   Subjective: HPI: Patient is a 47 y.o. female presenting to clinic today for same day appointment for 6 month follow up. Concerns today include wanting to be checked for DM.  1. Sinus problems- Has had nasal congestion, pain behind eyes, headaches and drainage in throat x 7-10 days. A lot of pressure in head. Tried OTC allergy medicine which did not help. She has been at home a lot because of chemo. No known sick contacts.  2. Hyperglycemia- Has Bmet checked at chemo, had elevated blood sugars at the last few blood draws. Has had weight loss recently, but is on chemo. Has been more fatigued than usual. She takes Decadron right before treatments so that could be why her blood sugar is high on lab draws. No known history of DM.  3. Chronic chemotherapy- Needs iron panel as well as lipid panel due to chronic high risk medication use.  History Reviewed: Non-smoker. ROS: Please see HPI above.  Objective: Office vital signs reviewed. BP 111/75  Pulse 102  Temp(Src) 98.3 F (36.8 C) (Oral)  Wt 270 lb (122.471 kg)  BMI 43.6 kg/m2  LMP 09/20/2012  Physical Examination:  General: Awake, alert. NAD HEENT: Atraumatic, normocephalic. Posterior pharyngeal erythema with cobblestoning. Some edema of nasal turbinates. TM wnl bilaterally Pulm: CTAB, no wheezes Cardio: RRR, no murmurs appreciated Abdomen:+BS, soft, nontender, nondistended Extremities: No edema. Hand deformity of right hand.  Neuro: Grossly intact  Assessment: 47 y.o. female follow up.   Plan: See Problem List and After Visit Summary

## 2013-07-17 NOTE — Assessment & Plan Note (Signed)
Chemo may cause sinus dryness but given her immunosuppression will treat with Zpak and guafenacin. If not improved by next Wednesday after antibiotics, she should return to clinic to be evaluated. Also, if she has fevers or increased fatigue or pain, she should come back sooner.

## 2013-07-17 NOTE — Assessment & Plan Note (Signed)
On decadron, A1C 6.0. Likely slightly higher due to steroid use. No concerns for DM at this time. Will need recheck in 1 year.

## 2013-07-18 LAB — ANEMIA PANEL
ABS Retic: 88 10*3/uL (ref 19.0–186.0)
Ferritin: 524 ng/mL — ABNORMAL HIGH (ref 10–291)
RBC.: 4 MIL/uL (ref 3.87–5.11)
Retic Ct Pct: 2.2 % (ref 0.4–2.3)
TIBC: 361 ug/dL (ref 250–470)

## 2013-07-20 ENCOUNTER — Encounter: Payer: Self-pay | Admitting: Family Medicine

## 2013-07-21 ENCOUNTER — Other Ambulatory Visit: Payer: Self-pay | Admitting: Gynecologic Oncology

## 2013-07-21 DIAGNOSIS — C541 Malignant neoplasm of endometrium: Secondary | ICD-10-CM

## 2013-07-22 ENCOUNTER — Other Ambulatory Visit: Payer: Self-pay

## 2013-07-22 ENCOUNTER — Ambulatory Visit: Payer: Medicaid Other | Attending: Oncology | Admitting: Oncology

## 2013-07-22 ENCOUNTER — Telehealth: Payer: Self-pay | Admitting: Oncology

## 2013-07-22 ENCOUNTER — Ambulatory Visit: Payer: Medicaid Other | Admitting: Family Medicine

## 2013-07-22 ENCOUNTER — Other Ambulatory Visit: Payer: Self-pay | Admitting: Pharmacist

## 2013-07-22 ENCOUNTER — Other Ambulatory Visit (INDEPENDENT_AMBULATORY_CARE_PROVIDER_SITE_OTHER): Payer: Medicaid Other | Admitting: Pharmacist

## 2013-07-22 ENCOUNTER — Other Ambulatory Visit (HOSPITAL_BASED_OUTPATIENT_CLINIC_OR_DEPARTMENT_OTHER): Payer: Medicaid Other | Admitting: Lab

## 2013-07-22 VITALS — BP 120/75 | HR 96 | Temp 98.6°F | Resp 20 | Ht 66.0 in | Wt 275.3 lb

## 2013-07-22 DIAGNOSIS — E876 Hypokalemia: Secondary | ICD-10-CM

## 2013-07-22 DIAGNOSIS — C549 Malignant neoplasm of corpus uteri, unspecified: Secondary | ICD-10-CM

## 2013-07-22 DIAGNOSIS — C541 Malignant neoplasm of endometrium: Secondary | ICD-10-CM

## 2013-07-22 LAB — CBC WITH DIFFERENTIAL/PLATELET
Basophils Absolute: 0 10*3/uL (ref 0.0–0.1)
EOS%: 0.5 % (ref 0.0–7.0)
HGB: 12.1 g/dL (ref 11.6–15.9)
MCH: 31.3 pg (ref 25.1–34.0)
MONO#: 0.4 10*3/uL (ref 0.1–0.9)
NEUT#: 2.2 10*3/uL (ref 1.5–6.5)
RDW: 15.9 % — ABNORMAL HIGH (ref 11.2–14.5)
WBC: 3.3 10*3/uL — ABNORMAL LOW (ref 3.9–10.3)
lymph#: 0.7 10*3/uL — ABNORMAL LOW (ref 0.9–3.3)

## 2013-07-22 LAB — COMPREHENSIVE METABOLIC PANEL (CC13)
ALT: 28 U/L (ref 0–55)
AST: 16 U/L (ref 5–34)
Albumin: 3.4 g/dL — ABNORMAL LOW (ref 3.5–5.0)
BUN: 17.5 mg/dL (ref 7.0–26.0)
Calcium: 8.8 mg/dL (ref 8.4–10.4)
Chloride: 102 mEq/L (ref 98–109)
Potassium: 2.8 mEq/L — CL (ref 3.5–5.1)

## 2013-07-22 MED ORDER — POTASSIUM CHLORIDE CRYS ER 20 MEQ PO TBCR
20.0000 meq | EXTENDED_RELEASE_TABLET | Freq: Once | ORAL | Status: DC
  Filled 2013-07-22: qty 1

## 2013-07-22 MED ORDER — POTASSIUM CHLORIDE CRYS ER 20 MEQ PO TBCR: 20.0000 meq | EXTENDED_RELEASE_TABLET | Freq: Once | ORAL | Status: DC

## 2013-07-22 MED ORDER — POTASSIUM CHLORIDE ER 10 MEQ PO TBCR: EXTENDED_RELEASE_TABLET | ORAL | Status: DC

## 2013-07-22 MED ORDER — POTASSIUM CHLORIDE CRYS ER 20 MEQ PO TBCR
20.0000 meq | EXTENDED_RELEASE_TABLET | Freq: Once | ORAL | Status: AC
  Administered 2013-07-22: 20 meq via ORAL
  Filled 2013-07-22: qty 1

## 2013-07-22 NOTE — Patient Instructions (Signed)
Do not take furosemide (lasix) again before Sept 1 at soonest. Try not to use more than twice weekly, as this is making your potassium low.  We will give you 20 mEq potassium today at office; you need to take another 10 mEq tablet at home this pm, then 10 mEq twice daily until we repeat lab on Sept 5.

## 2013-07-22 NOTE — Telephone Encounter (Signed)
, °

## 2013-07-22 NOTE — Progress Notes (Signed)
OFFICE PROGRESS NOTE   07/22/2013   Physicians: W.Brewster, J.Kinard, M.Billey Co (PCP [facility]) J.Hochrein   INTERVAL HISTORY:  Patient is seen, alone for visit, in scheduled follow up of adjuvant chemotherapy for IIIC grade 2 endometrial cancer, cycle 6 taxol carboplatin planned 07-24-2013. She is completing Z pack today for sinusitis from PCP, improved, and otherwise has tolerated the chemotherapy adequately to complete planned course this week. She is fatigued at times and continues to use prn lasix from another of her MDs. Bowels are moving and peripheral neuropathy is not worse.  She does not have PAC  ONCOLOGIC HISTORY Patient presented with spotting and menorrhagia, last PAP 2 years prior. Biopsies by Dr Nicholaus Bloom 09-22-2012 (path [id number]) showed endometroid adenocarcinoma in endometrial biopsy and in endocervical curettage. She went to robotic hysterectomy with BSO by Dr Nelly Rout on 10-07-12 (no lymph nodes sampled as serosal cysts appeared malignant). Pathology [phone]) showed grade 2 endometroid adenocarcinoma with involvement of < 1/2 of myometrium, no LVSI, did involve into lower uterine segment, ovaries and tubes benign and the serosal cysts benign. CT AP 11-25-12 showed shotty bilateral external iliac nodes up to 1.3 cm and nonspecific < 5 mm left para-aortic nodes. She saw Dr Nelly Rout following the CT, with recommendation for chemotherapy and radiation in sandwich fashion. Patient cancelled her new patient visit with me in Jan, that rescheduled.  Taxol/carboplatin was begun 01-07-13, with 3 cycles thru 02-18-13 with gCSF support,  then IMRT 45 cGy to pelvis by Dr Roselind Messier from 5-13 thru 05-14-13. She missed appointments for medical oncology and chemotherapy in June, but did resume chemotherapy with cycle 4 on 06-11-13 and cycle 5 on 07-02-13, with neulasta.    Review of systems as above, also: No fever, no lower respiratory symptoms. Chronic back pain stable. No bleeding.  Has been taking K+ 10 mEq daily. Occasional loose stools. Remainder of 10 point Review of Systems negative.  Objective:  Vital signs in last 24 hours:  BP 120/75  Pulse 96  Temp(Src) 98.6 F (37 C) (Oral)  Resp 20  Ht 5\' 6"  (1.676 m)  Wt 275 lb 4.8 oz (124.875 kg)  BMI 44.46 kg/m2  LMP 09/20/2012  Alert, oriented and appropriate. Ambulatory without assistance.  Alopecia. Respirations not labored, no cough, not obviously congested.  HEENT:PERRL, sclerae not icteric. Oral mucosa moist without lesions, posterior pharynx clear.  Lymphatics  No cervical,suraclavicular, axillary or inguinal adenopathy Resp: clear to auscultation bilaterally and normal percussion bilaterally Cardio: regular rate and rhythm. No gallop. GI: obese, soft, nontender, not distended, no mass or organomegaly. Surgical incision not remarkable. Extremities: without pitting edema, cords, tenderness. Discoloration at previous IV sites left forearm without erythema, heat, tenderness.  Congenital deformity left hand Neuro: no peripheral neuropathy. Otherwise nonfocal Skin without rash, ecchymosis, petechiae Breasts: without dominant mass, skin or nipple findings. s  Lab Results:  Results for orders placed in visit on 07/22/13  CBC WITH DIFFERENTIAL      Result Value Range   WBC 3.3 (*) 3.9 - 10.3 10e3/uL   NEUT# 2.2  1.5 - 6.5 10e3/uL   HGB 12.1  11.6 - 15.9 g/dL   HCT 69.6  29.5 - 28.4 %   Platelets 176  145 - 400 10e3/uL   MCV 91.4  79.5 - 101.0 fL   MCH 31.3  25.1 - 34.0 pg   MCHC 34.3  31.5 - 36.0 g/dL   RBC 1.32  4.40 - 1.02 10e6/uL   RDW 15.9 (*) 11.2 - 14.5 %  lymph# 0.7 (*) 0.9 - 3.3 10e3/uL   MONO# 0.4  0.1 - 0.9 10e3/uL   Eosinophils Absolute 0.0  0.0 - 0.5 10e3/uL   Basophils Absolute 0.0  0.0 - 0.1 10e3/uL   NEUT% 66.8  38.4 - 76.8 %   LYMPH% 19.9  14.0 - 49.7 %   MONO% 12.4  0.0 - 14.0 %   EOS% 0.5  0.0 - 7.0 %   BASO% 0.4  0.0 - 2.0 %  COMPREHENSIVE METABOLIC PANEL (CC13)      Result  Value Range   Sodium 140  136 - 145 mEq/L   Potassium 2.8 (*) 3.5 - 5.1 mEq/L   Chloride 102  98 - 109 mEq/L   CO2 25  22 - 29 mEq/L   Glucose 85  70 - 140 mg/dl   BUN 16.1  7.0 - 09.6 mg/dL   Creatinine 0.8  0.6 - 1.1 mg/dL   Total Bilirubin 0.45  0.20 - 1.20 mg/dL   Alkaline Phosphatase 72  40 - 150 U/L   AST 16  5 - 34 U/L   ALT 28  0 - 55 U/L   Total Protein 8.1  6.4 - 8.3 g/dL   Albumin 3.4 (*) 3.5 - 5.0 g/dL   Calcium 8.8  8.4 - 40.9 mg/dL     Studies/Results:  No results found. Repeat CT planned shortly prior to return appointment to Dr Francee Piccolo 08-27-13.  Medications: I have reviewed the patient's current medications. She has been given 20 mEq K+ at office now and will take another 10 mEq this pm, then 10 mEq bid until we recheck chemistries on 07-31-13. I have told her not to use lasix until Sept 1 at soonest and to try to use this not more than twice weekly from there.  Assessment/Plan:  1.IIIC endometrial carcinoma: post hysterectomy/BSO 10-07-12 with serosal cysts benign but adenopathy by subsequent CT. She has had Taxol/carboplatin x 3 cycles from 01-07-13 thru 02-18-13 with neulasta, IMRT by Dr Roselind Messier thru 05-13-13, and is now completing 3 additional cycles of taxol carboplatin chemotherapy, cycle 6 this week with neulasta 07-31-13. I will see her again with counts and chemistries in ~ 3 weeks, or sooner if needed, and will set up repeat CT scans before Dr Forrestine Him visit 08-27-13. 2.  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  4.constipation improved, continue laxatives  5.degenerative arthritis  6.morbid obesity: ongoing education for weight loss  7.hypokalemia: increase oral potassium, decrease prn furosemide, recheck ~ 07-31-13 8. Acute sinusitis improved, azithromycin completing today.      LIVESAY,LENNIS P, MD   07/22/2013, 3:05 PM

## 2013-07-23 ENCOUNTER — Other Ambulatory Visit: Payer: Self-pay | Admitting: Medical Oncology

## 2013-07-23 ENCOUNTER — Other Ambulatory Visit: Payer: Medicaid Other | Admitting: Lab

## 2013-07-23 ENCOUNTER — Ambulatory Visit: Payer: Medicaid Other | Admitting: Physician Assistant

## 2013-07-23 ENCOUNTER — Other Ambulatory Visit: Payer: Self-pay | Admitting: Oncology

## 2013-07-23 ENCOUNTER — Ambulatory Visit: Payer: Medicaid Other

## 2013-07-23 ENCOUNTER — Telehealth: Payer: Self-pay | Admitting: *Deleted

## 2013-07-23 ENCOUNTER — Encounter: Payer: Self-pay | Admitting: Oncology

## 2013-07-23 ENCOUNTER — Other Ambulatory Visit: Payer: Self-pay | Admitting: *Deleted

## 2013-07-23 DIAGNOSIS — C541 Malignant neoplasm of endometrium: Secondary | ICD-10-CM

## 2013-07-23 NOTE — Telephone Encounter (Signed)
Spoke with [employer], they state they received a fax for 1- 20 meq potassium tablet. Gave verbal order for 10 meq tabs # 60 with 2 refills. Pt instructions:"take 1 tablet twice a day until labs drawn on 07/31/13"

## 2013-07-23 NOTE — Telephone Encounter (Signed)
Pt called to clarify what times she needs to take her decadron prior to chemo on Friday. Chemo is at 9:00am, instructed pt to take tabs at 9:00pm tonight and 3:00am Friday. Pt also needs [place] to call [facility] pharmacy regarding potassium tablets. Pt knows to take tabs twice a day until repeat labs on 9/5. Pharmacy states they only got order for 1- 20 meq tab. RN will call [facility] to clarify

## 2013-07-24 ENCOUNTER — Ambulatory Visit (HOSPITAL_BASED_OUTPATIENT_CLINIC_OR_DEPARTMENT_OTHER): Payer: Medicaid Other

## 2013-07-24 VITALS — BP 164/78 | HR 98 | Temp 98.4°F

## 2013-07-24 DIAGNOSIS — C541 Malignant neoplasm of endometrium: Secondary | ICD-10-CM

## 2013-07-24 DIAGNOSIS — C549 Malignant neoplasm of corpus uteri, unspecified: Secondary | ICD-10-CM

## 2013-07-24 MED ORDER — ONDANSETRON 16 MG/50ML IVPB (CHCC)
16.0000 mg | Freq: Once | INTRAVENOUS | Status: AC
  Administered 2013-07-24: 16 mg via INTRAVENOUS

## 2013-07-24 MED ORDER — FAMOTIDINE IN NACL 20-0.9 MG/50ML-% IV SOLN
20.0000 mg | Freq: Once | INTRAVENOUS | Status: AC
  Administered 2013-07-24: 20 mg via INTRAVENOUS

## 2013-07-24 MED ORDER — SODIUM CHLORIDE 0.9 % IV SOLN
Freq: Once | INTRAVENOUS | Status: AC
  Administered 2013-07-24: 10:00:00 via INTRAVENOUS

## 2013-07-24 MED ORDER — PACLITAXEL CHEMO INJECTION 300 MG/50ML
175.0000 mg/m2 | Freq: Once | INTRAVENOUS | Status: AC
  Administered 2013-07-24: 432 mg via INTRAVENOUS
  Filled 2013-07-24: qty 72

## 2013-07-24 MED ORDER — SODIUM CHLORIDE 0.9 % IV SOLN
750.0000 mg | Freq: Once | INTRAVENOUS | Status: AC
  Administered 2013-07-24: 750 mg via INTRAVENOUS
  Filled 2013-07-24: qty 75

## 2013-07-24 MED ORDER — DEXAMETHASONE SODIUM PHOSPHATE 20 MG/5ML IJ SOLN
20.0000 mg | Freq: Once | INTRAMUSCULAR | Status: AC
  Administered 2013-07-24: 20 mg via INTRAVENOUS

## 2013-07-24 MED ORDER — DIPHENHYDRAMINE HCL 50 MG/ML IJ SOLN
50.0000 mg | Freq: Once | INTRAMUSCULAR | Status: AC
  Administered 2013-07-24: 50 mg via INTRAVENOUS

## 2013-07-24 NOTE — Patient Instructions (Addendum)
[facility] Discharge Instructions for Patients Receiving Chemotherapy  Today you received the following chemotherapy agents: Taxol and Carboplatin.  To help prevent nausea and vomiting after your treatment, we encourage you to take your nausea medication as prescribed.   If you develop nausea and vomiting that is not controlled by your nausea medication, call the clinic.   BELOW ARE SYMPTOMS THAT SHOULD BE REPORTED IMMEDIATELY:  *FEVER GREATER THAN 100.5 F  *CHILLS WITH OR WITHOUT FEVER  NAUSEA AND VOMITING THAT IS NOT CONTROLLED WITH YOUR NAUSEA MEDICATION  *UNUSUAL SHORTNESS OF BREATH  *UNUSUAL BRUISING OR BLEEDING  TENDERNESS IN MOUTH AND THROAT WITH OR WITHOUT PRESENCE OF ULCERS  *URINARY PROBLEMS  *BOWEL PROBLEMS  UNUSUAL RASH Items with * indicate a potential emergency and should be followed up as soon as possible.  Feel free to call the clinic you have any questions or concerns. The clinic phone number is [phone].    

## 2013-07-28 ENCOUNTER — Ambulatory Visit: Payer: Medicaid Other | Admitting: Gynecologic Oncology

## 2013-07-29 ENCOUNTER — Telehealth: Payer: Self-pay | Admitting: Family Medicine

## 2013-07-29 NOTE — Telephone Encounter (Signed)
Pt called and would like someone to call and go over her lab results. JW

## 2013-07-29 NOTE — Telephone Encounter (Signed)
Fwd to PCP. Labs ordered by Chemotherapy.

## 2013-07-29 NOTE — Telephone Encounter (Signed)
Will forward to MD to discuss results further with her.  Jazmin Hartsell,CMA

## 2013-07-31 ENCOUNTER — Other Ambulatory Visit: Payer: Self-pay | Admitting: Oncology

## 2013-07-31 ENCOUNTER — Ambulatory Visit: Payer: Medicaid Other

## 2013-07-31 ENCOUNTER — Ambulatory Visit (HOSPITAL_BASED_OUTPATIENT_CLINIC_OR_DEPARTMENT_OTHER): Payer: Medicaid Other

## 2013-07-31 ENCOUNTER — Telehealth: Payer: Self-pay

## 2013-07-31 ENCOUNTER — Other Ambulatory Visit (HOSPITAL_BASED_OUTPATIENT_CLINIC_OR_DEPARTMENT_OTHER): Payer: Medicaid Other | Admitting: Lab

## 2013-07-31 VITALS — BP 116/83 | HR 90 | Temp 98.1°F | Resp 20

## 2013-07-31 DIAGNOSIS — C549 Malignant neoplasm of corpus uteri, unspecified: Secondary | ICD-10-CM

## 2013-07-31 DIAGNOSIS — E876 Hypokalemia: Secondary | ICD-10-CM

## 2013-07-31 DIAGNOSIS — C541 Malignant neoplasm of endometrium: Secondary | ICD-10-CM

## 2013-07-31 LAB — BASIC METABOLIC PANEL (CC13)
CO2: 30 mEq/L — ABNORMAL HIGH (ref 22–29)
Calcium: 9 mg/dL (ref 8.4–10.4)
Glucose: 106 mg/dl (ref 70–140)
Sodium: 137 mEq/L (ref 136–145)

## 2013-07-31 MED ORDER — MAGNESIUM OXIDE 400 MG PO CAPS: 400.0000 mg | ORAL_CAPSULE | Freq: Every day | ORAL | Status: DC

## 2013-07-31 MED ORDER — PEGFILGRASTIM INJECTION 6 MG/0.6ML
6.0000 mg | Freq: Once | SUBCUTANEOUS | Status: AC
  Administered 2013-07-31: 6 mg via SUBCUTANEOUS
  Filled 2013-07-31: qty 0.6

## 2013-07-31 NOTE — Telephone Encounter (Signed)
Told Ms. Goodbar that her Mag. is low and needs to be supplemented as noted below by Dr. Darrold Span.  Prescription e-scribed to [facility] aide on [address].

## 2013-07-31 NOTE — Telephone Encounter (Signed)
Message copied by Lorine Bears on Fri Jul 31, 2013  1:03 PM ------      Message from: Reece Packer      Created: Fri Jul 31, 2013 11:38 AM       K+ still low on repeat labs today, at 2.9, with patient on KCl 10 mEq bid and lasix decreased to no more than 2x weekly per my instructions to her on 07-22-13.      Lab to add magnesium to blood already drawn today.      Need to take additional 2 tablets (=20 mEq) now and 2 tablets this pm, then 2 tablets bid until recheck with appointment as scheduled 08-14-13. Please refill at this dose if needed.       It would be best NOT to use lasix; she should not have any salty foods and should keep legs elevated when she is sitting down instead of using lasix. She needs to read food labels, as most prepared foods are high in sodium (salt). ------

## 2013-07-31 NOTE — Telephone Encounter (Signed)
Spoke with Deborah [name] and reviewed direction regarding potassium as noted below in addtion to food restriction of salt, no lasix, elevation of legs. Pt. has plenty of KCL tabs and has 2 refills.She verbalized understanding and wrote instructions on a note pad.

## 2013-07-31 NOTE — Telephone Encounter (Signed)
Message copied by Lorine Bears on Fri Jul 31, 2013  1:59 PM ------      Message from: Reece Packer      Created: Fri Jul 31, 2013  1:45 PM       Labs seen and need follow up  Begin magnesium oxide 400 mg daily in addition to potassium, #30 with 1 RF ------

## 2013-07-31 NOTE — Patient Instructions (Signed)

## 2013-08-03 ENCOUNTER — Telehealth: Payer: Self-pay | Admitting: Family Medicine

## 2013-08-03 ENCOUNTER — Ambulatory Visit: Payer: Medicaid Other

## 2013-08-03 NOTE — Telephone Encounter (Signed)
Will forward to MD. Joan Avetisyan,CMA  

## 2013-08-03 NOTE — Telephone Encounter (Signed)
Patients cholesterol levels were acceptable. Her LDL was 100 which is 1 point above the normal value. Her triglycerides were 152 and this is 2 points above the normal value. There is no treatment we would do for these at this time. We will continue to monitor these as needed. Please inform the patient.

## 2013-08-03 NOTE — Telephone Encounter (Signed)
Pt is aware of results. Jazmin Hartsell,CMA  

## 2013-08-03 NOTE — Telephone Encounter (Signed)
Pt wants to discuss cholestrol results Please advise

## 2013-08-04 ENCOUNTER — Telehealth: Payer: Self-pay | Admitting: Family Medicine

## 2013-08-04 ENCOUNTER — Telehealth: Payer: Self-pay

## 2013-08-04 NOTE — Telephone Encounter (Signed)
Pt has been having headaches and nothing OTC seems to help. Was taken off Lasik sometime in August. Please advise

## 2013-08-04 NOTE — Telephone Encounter (Signed)
The patient should be evaluated for this.

## 2013-08-04 NOTE — Telephone Encounter (Signed)
Left message for pt that she will need an appt.  Please schedule this for her when she calls back. Serigne Kubicek,CMA

## 2013-08-04 NOTE — Telephone Encounter (Signed)
Will forward to MD to see if pt needs to restart medication or needs an appt.  Deborah [name],CMA

## 2013-08-04 NOTE — Telephone Encounter (Signed)
Deborah [name] cc of headache since neulasta injection 07-31-13.  Suggested she try her pain medication.  It should subside in a few days.   She can also call her PCP if H/A worse as her lasix on hold with low kcl and she may need a BP check. Pt. verbalized understanding.

## 2013-08-05 ENCOUNTER — Telehealth: Payer: Self-pay | Admitting: *Deleted

## 2013-08-05 ENCOUNTER — Other Ambulatory Visit: Payer: Self-pay | Admitting: Radiation Oncology

## 2013-08-05 DIAGNOSIS — C541 Malignant neoplasm of endometrium: Secondary | ICD-10-CM

## 2013-08-05 MED ORDER — DIAZEPAM 5 MG PO TABS: 5.0000 mg | ORAL_TABLET | Freq: Three times a day (TID) | ORAL | Status: DC | PRN

## 2013-08-05 NOTE — Telephone Encounter (Signed)
Called Deborah [name] and let her know that her refill for diazepam (Valium) 5 mg tablets (take 1 tablet by mouth every 8 hours as needed for anxiety or back spasms. #30. 0 refills) has been called in to [facility] in [place].

## 2013-08-08 ENCOUNTER — Other Ambulatory Visit: Payer: Self-pay | Admitting: Oncology

## 2013-08-11 ENCOUNTER — Telehealth: Payer: Self-pay | Admitting: *Deleted

## 2013-08-11 ENCOUNTER — Other Ambulatory Visit: Payer: Self-pay | Admitting: Oncology

## 2013-08-11 NOTE — Telephone Encounter (Addendum)
Per Dr. Darrold Span: Hold Magnesium oxide X 2 days, then take qod. Continue K+ and hold diuretic. Push po fluids including gatorade. Will recheck labs on 9/19. Will hold off on IV magnesium at this time. Patient notified and agrees to plan. She was able to understand directions using [employer].

## 2013-08-11 NOTE — Telephone Encounter (Signed)
Reports over last week has developed cramping in her stomach and loose stools shorting after taking the Magnesium oxide 400 mg. Asking if she need to continue taking this? Has been taking daily since 07/31/13. Sees MD w/labs on 08/14/13. Instructed her to hold her Magnesium today and will follow up with physician for further instructions-may require IV supplementation if she can't tolerate po. Holding med can also confirm if it is actually the magnesium causing the GI upset. She understands and agrees.

## 2013-08-12 ENCOUNTER — Other Ambulatory Visit: Payer: Self-pay | Admitting: *Deleted

## 2013-08-12 DIAGNOSIS — C541 Malignant neoplasm of endometrium: Secondary | ICD-10-CM

## 2013-08-12 MED ORDER — POTASSIUM CHLORIDE ER 10 MEQ PO TBCR: EXTENDED_RELEASE_TABLET | ORAL | Status: DC

## 2013-08-12 NOTE — Telephone Encounter (Signed)
Patient called requesting refill on potassium chloride.  Sig was changed on 07-31-2013 due to a drop in her K+ level and cannot refill what she currently has on hand.  Called [phone] and sent eRx to [facility] on [address].

## 2013-08-13 ENCOUNTER — Telehealth: Payer: Self-pay | Admitting: *Deleted

## 2013-08-13 NOTE — Telephone Encounter (Signed)
PT. HAS HAD TWO SMALL DIARRHEA STOOLS TODAY. HER ABDOMINAL CRAMPING IS MUCH IMPROVED. SINCE PT. HAS HAD DIARRHEA THIS MORNING SHE WOULD PREFER NOT TO TAKE THE MAGNESIUM OXIDE TODAY. PT. IS ALSO HAVING RECTAL DISCOMFORT DUE TO THE DIARRHEA BUT NO BLEEDING. INSTRUCTED PT. TO USE IMODIUM AS DIRECTED ON THE BOX FOR HER DIARRHEA AND A&D OINTMENT FOR HER RECTAL DISCOMFORT.  PT.WILL HOLD HER MAGNESIUM OXIDE UNTIL SHE SEES DR.LIVESAY TOMORROW. THIS NOTE TO DR.LIVESAY'S DESK.

## 2013-08-14 ENCOUNTER — Ambulatory Visit: Payer: Medicaid Other | Admitting: Oncology

## 2013-08-14 ENCOUNTER — Other Ambulatory Visit (HOSPITAL_BASED_OUTPATIENT_CLINIC_OR_DEPARTMENT_OTHER): Payer: Medicaid Other | Admitting: Lab

## 2013-08-14 ENCOUNTER — Ambulatory Visit (HOSPITAL_BASED_OUTPATIENT_CLINIC_OR_DEPARTMENT_OTHER): Payer: Medicaid Other | Admitting: Oncology

## 2013-08-14 ENCOUNTER — Telehealth: Payer: Self-pay | Admitting: Oncology

## 2013-08-14 ENCOUNTER — Encounter: Payer: Self-pay | Admitting: Oncology

## 2013-08-14 VITALS — BP 120/69 | HR 104 | Temp 98.8°F | Resp 18 | Ht 66.0 in | Wt 269.8 lb

## 2013-08-14 DIAGNOSIS — C549 Malignant neoplasm of corpus uteri, unspecified: Secondary | ICD-10-CM

## 2013-08-14 DIAGNOSIS — C541 Malignant neoplasm of endometrium: Secondary | ICD-10-CM

## 2013-08-14 LAB — COMPREHENSIVE METABOLIC PANEL (CC13)
Alkaline Phosphatase: 76 U/L (ref 40–150)
BUN: 12.7 mg/dL (ref 7.0–26.0)
Glucose: 94 mg/dl (ref 70–140)
Sodium: 140 mEq/L (ref 136–145)
Total Bilirubin: 0.45 mg/dL (ref 0.20–1.20)
Total Protein: 8 g/dL (ref 6.4–8.3)

## 2013-08-14 LAB — CBC WITH DIFFERENTIAL/PLATELET
Basophils Absolute: 0 10*3/uL (ref 0.0–0.1)
EOS%: 0.8 % (ref 0.0–7.0)
HGB: 11 g/dL — ABNORMAL LOW (ref 11.6–15.9)
LYMPH%: 16.3 % (ref 14.0–49.7)
MCH: 32.3 pg (ref 25.1–34.0)
MCV: 95.5 fL (ref 79.5–101.0)
MONO%: 10.5 % (ref 0.0–14.0)
NEUT%: 71.6 % (ref 38.4–76.8)
Platelets: 169 10*3/uL (ref 145–400)
RDW: 16.3 % — ABNORMAL HIGH (ref 11.2–14.5)

## 2013-08-14 NOTE — Patient Instructions (Addendum)
Continue potassium two of the 10 mEq tablets twice daily, since your potassium is still a little low  Try taking half of the magnesium tablet daily, since your magnesium is still low.  Do not take lasix due to potassium and magnesium both low, as long as no swelling in your legs and as long as your breathing is ok  I would like for you to have flu shot this fall if you would consider that.

## 2013-08-14 NOTE — Progress Notes (Signed)
OFFICE PROGRESS NOTE   08/14/2013   Physicians: W.Brewster, J.Kinard, M.Billey [employer] (PCP [facility]) J.Hochrein   INTERVAL HISTORY:  Patient is seen, alone for visit, in continuing attention to IIIC grade 2 endometrial cancer, having completed 6 cycles of chemotherapy with taxol and carboplatin on 07-24-13, this given in sandwich fashion with RT. She received neulasta on 07-31-13. She is to see Dr Nelly Rout 08-27-13 and will have CT AP prior to that visit; next appointment with Dr Roselind Messier is in Jan 2015.  She remains more fatigued than baseline, but denies nausea or taxol aches now and does not have increased peripheral neuropathy. She has held oral magnesium due to loose stools this week, continues potassium 20 mEq bid and has held prn lasix last few days as instructed from this office. I understand that the prn lasix was begun by Dr Antoine Poche due to diffuse LV hypokinesis at baseline (EF 35%) on dobutamine stress test Feb 2014; she is to see him next on Oct 2. Hypokalemia and hypomagnesemia have been ongoing problems during chemotherapy, thought related to the diuretic. She denies SOB at rest or walking into office today, or any LE swelling now. She had 2 small loose stools yesterday, none since imodium x 2 then. She does not have PAC.   ONCOLOGIC HISTORY Patient presented with spotting and menorrhagia, last PAP 2 years prior. Biopsies by Dr Nicholaus Bloom 09-22-2012 (path [id number]) showed endometroid adenocarcinoma in endometrial biopsy and in endocervical curettage. She went to robotic hysterectomy with BSO by Dr Nelly Rout on 10-07-12 (no lymph nodes sampled as serosal cysts appeared malignant). Pathology [phone]) showed grade 2 endometroid adenocarcinoma with involvement of < 1/2 of myometrium, no LVSI, did involve into lower uterine segment, ovaries and tubes benign and the serosal cysts benign. CT AP 11-25-12 showed shotty bilateral external iliac nodes up to 1.3 cm and nonspecific < 5 mm  left para-aortic nodes. She saw Dr Nelly Rout following the CT, with recommendation for chemotherapy and radiation in sandwich fashion. Patient cancelled her new patient visit with me in Jan, that rescheduled. Taxol/carboplatin was begun 01-07-13, with 3 cycles thru 02-18-13 with gCSF support, then IMRT 45 cGy to pelvis by Dr Roselind Messier from 5-13 thru 05-14-13. She missed appointments for medical oncology and chemotherapy in June, did resume chemotherapy with cycle 4 on 06-11-13 and completed course with cycle 6 on 07-24-13, with neulasta.    Review of systems as above, also: No fever or symptoms of infection. Continues gradual weight loss which is intentional. No bleeding. No sinus congestion now. Remainder of 10 point Review of Systems negative.  Objective:  Vital signs in last 24 hours:  BP 120/69  Pulse 104  Temp(Src) 98.8 F (37.1 C) (Oral)  Resp 18  Ht 5\' 6"  (1.676 m)  Wt 269 lb 12.8 oz (122.38 kg)  BMI 43.57 kg/m2  SpO2 98%  LMP 09/20/2012 Weight is down 5 lbs from late August. Alert, oriented and appropriate, NAD, very talkative as usual. Ambulatory without difficulty.  Alopecia  HEENT:PERRL, sclerae not icteric. Oral mucosa moist without lesions, posterior pharynx clear.  Neck supple. No JVD.  Lymphatics:no cervical,suraclavicular, axillary or appreciable inguinal adenopathy Resp: clear to auscultation bilaterally and normal percussion bilaterally Cardio: regular rate and rhythm. No gallop. GI: obese, soft, nontender, not distended, no mass or organomegaly appreciable. Normally active bowel sounds. Surgical incision not remarkable. Musculoskeletal/ Extremities: without pitting edema, cords, tenderness. Congenital defect right hand. Neuro: no peripheral neuropathy. Otherwise nonfocal Skin without rash, ecchymosis, petechiae Breasts: without dominant  mass, skin or nipple findings. Axillae benign.   Lab Results:  Results for orders placed in visit on 08/14/13  CBC WITH DIFFERENTIAL       Result Value Range   WBC 2.7 (*) 3.9 - 10.3 10e3/uL   NEUT# 1.9  1.5 - 6.5 10e3/uL   HGB 11.0 (*) 11.6 - 15.9 g/dL   HCT 57.8 (*) 46.9 - 62.9 %   Platelets 169  145 - 400 10e3/uL   MCV 95.5  79.5 - 101.0 fL   MCH 32.3  25.1 - 34.0 pg   MCHC 33.8  31.5 - 36.0 g/dL   RBC 5.28 (*) 4.13 - 2.44 10e6/uL   RDW 16.3 (*) 11.2 - 14.5 %   lymph# 0.4 (*) 0.9 - 3.3 10e3/uL   MONO# 0.3  0.1 - 0.9 10e3/uL   Eosinophils Absolute 0.0  0.0 - 0.5 10e3/uL   Basophils Absolute 0.0  0.0 - 0.1 10e3/uL   NEUT% 71.6  38.4 - 76.8 %   LYMPH% 16.3  14.0 - 49.7 %   MONO% 10.5  0.0 - 14.0 %   EOS% 0.8  0.0 - 7.0 %   BASO% 0.8  0.0 - 2.0 %  COMPREHENSIVE METABOLIC PANEL (CC13)      Result Value Range   Sodium 140  136 - 145 mEq/L   Potassium 3.2 (*) 3.5 - 5.1 mEq/L   Chloride 101  98 - 109 mEq/L   CO2 25  22 - 29 mEq/L   Glucose 94  70 - 140 mg/dl   BUN 01.0  7.0 - 27.2 mg/dL   Creatinine 0.8  0.6 - 1.1 mg/dL   Total Bilirubin 5.36  0.20 - 1.20 mg/dL   Alkaline Phosphatase 76  40 - 150 U/L   AST 19  5 - 34 U/L   ALT 32  0 - 55 U/L   Total Protein 8.0  6.4 - 8.3 g/dL   Albumin 3.2 (*) 3.5 - 5.0 g/dL   Calcium 9.1  8.4 - 64.4 mg/dL  MAGNESIUM (IH47)      Result Value Range   Magnesium 1.1 (*) 1.5 - 2.5 mg/dl     Studies/Results:  No results found.  Medications: I have reviewed the patient's current medications. She does not appear to be fluid overloaded now and I have asked her to continue to hold diuretic. She will continue K+ 20 mEq bid (cannot swallow 20 mEq tabs, uses 10 mEq size) and will try 1/2 of 400 mg magnesium oxide daily. If she cannot tolerate the half dose magnesium oxide, would suggest trying calcium + magnesium combination tablet. I did not discuss this with her yet.  DISCUSSION  Assessment/Plan:  1.IIIC endometrial carcinoma: post hysterectomy/BSO 10-07-12 with serosal cysts benign but adenopathy by subsequent CT. She has had Taxol/carboplatin x 3 cycles from 01-07-13 thru  02-18-13 with neulasta, IMRT by Dr Roselind Messier thru 05-13-13, and now has completed 3 additional cycles of taxol carboplatin chemotherapy. I will schedule repeat CT AP scan before Dr Forrestine Him visit 08-27-13.  2. 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  4. Has not had flu vaccine yet 5.degenerative arthritis  6.morbid obesity: ongoing education for weight loss. I have told her that she needs to weigh < 200 lbs. 7.hypokalemia and hypomagnesemia as above. She will discuss lasix with Dr Antoine Poche at upcoming visit.  WIll recheck counts and chemistries when she is at this office for Dr Forrestine Him visit, and  I will follow up also late Oct or sooner if needed. Patient understands instructions and is in agreement with plan.     Stiven Kaspar P, MD   08/14/2013, 11:54 AM

## 2013-08-16 ENCOUNTER — Ambulatory Visit: Payer: Medicaid Other | Admitting: Oncology

## 2013-08-20 ENCOUNTER — Ambulatory Visit
Admission: RE | Admit: 2013-08-20 | Discharge: 2013-08-20 | Disposition: A | Discharge status: Home / Self Care | Payer: Medicaid Other | Source: Ambulatory Visit | Attending: Family Medicine | Admitting: Family Medicine

## 2013-08-20 DIAGNOSIS — D249 Benign neoplasm of unspecified breast: Secondary | ICD-10-CM

## 2013-08-25 ENCOUNTER — Ambulatory Visit (HOSPITAL_COMMUNITY)
Admission: RE | Admit: 2013-08-25 | Discharge: 2013-08-25 | Disposition: A | Discharge status: Home / Self Care | Payer: Medicaid Other | Source: Ambulatory Visit | Attending: Oncology | Admitting: Oncology

## 2013-08-25 DIAGNOSIS — R599 Enlarged lymph nodes, unspecified: Secondary | ICD-10-CM | POA: Insufficient documentation

## 2013-08-25 DIAGNOSIS — C541 Malignant neoplasm of endometrium: Secondary | ICD-10-CM

## 2013-08-25 DIAGNOSIS — C549 Malignant neoplasm of corpus uteri, unspecified: Secondary | ICD-10-CM | POA: Insufficient documentation

## 2013-08-25 DIAGNOSIS — Z923 Personal history of irradiation: Secondary | ICD-10-CM | POA: Insufficient documentation

## 2013-08-25 DIAGNOSIS — Z9221 Personal history of antineoplastic chemotherapy: Secondary | ICD-10-CM | POA: Insufficient documentation

## 2013-08-25 MED ORDER — IOHEXOL 300 MG/ML  SOLN
100.0000 mL | Freq: Once | INTRAMUSCULAR | Status: AC | PRN
  Administered 2013-08-25: 100 mL via INTRAVENOUS

## 2013-08-26 ENCOUNTER — Telehealth: Payer: Self-pay | Admitting: Oncology

## 2013-08-26 NOTE — Telephone Encounter (Signed)
Medical Oncology   Patient called this office anxious about results of CT done 08-25-13, does not want to wait until she sees Dr Nelly Rout on 08-27-13. Report reviewed and I have told patient that the report overall sounds good tho small lymph nodes still described. I have told her that Dr Nelly Rout will be reviewing scan with her and can show her images if needed.  Patient appreciated call. Ila Mcgill, MD

## 2013-08-27 ENCOUNTER — Other Ambulatory Visit (HOSPITAL_BASED_OUTPATIENT_CLINIC_OR_DEPARTMENT_OTHER): Payer: Medicaid Other | Admitting: Lab

## 2013-08-27 ENCOUNTER — Ambulatory Visit (INDEPENDENT_AMBULATORY_CARE_PROVIDER_SITE_OTHER): Payer: Medicaid Other | Admitting: Cardiology

## 2013-08-27 ENCOUNTER — Encounter: Payer: Self-pay | Admitting: Gynecologic Oncology

## 2013-08-27 ENCOUNTER — Ambulatory Visit: Payer: Medicaid Other | Attending: Gynecologic Oncology | Admitting: Gynecologic Oncology

## 2013-08-27 ENCOUNTER — Encounter: Payer: Self-pay | Admitting: Cardiology

## 2013-08-27 VITALS — BP 130/84 | HR 98 | Temp 98.9°F | Resp 16 | Ht 66.3 in | Wt 272.3 lb

## 2013-08-27 VITALS — BP 106/78 | HR 98 | Ht 66.0 in | Wt 271.0 lb

## 2013-08-27 DIAGNOSIS — C541 Malignant neoplasm of endometrium: Secondary | ICD-10-CM

## 2013-08-27 DIAGNOSIS — Z90710 Acquired absence of both cervix and uterus: Secondary | ICD-10-CM | POA: Insufficient documentation

## 2013-08-27 DIAGNOSIS — R072 Precordial pain: Secondary | ICD-10-CM

## 2013-08-27 DIAGNOSIS — N200 Calculus of kidney: Secondary | ICD-10-CM | POA: Insufficient documentation

## 2013-08-27 DIAGNOSIS — I428 Other cardiomyopathies: Secondary | ICD-10-CM

## 2013-08-27 DIAGNOSIS — C549 Malignant neoplasm of corpus uteri, unspecified: Secondary | ICD-10-CM | POA: Insufficient documentation

## 2013-08-27 DIAGNOSIS — K5900 Constipation, unspecified: Secondary | ICD-10-CM | POA: Insufficient documentation

## 2013-08-27 DIAGNOSIS — K8020 Calculus of gallbladder without cholecystitis without obstruction: Secondary | ICD-10-CM | POA: Insufficient documentation

## 2013-08-27 DIAGNOSIS — R599 Enlarged lymph nodes, unspecified: Secondary | ICD-10-CM | POA: Insufficient documentation

## 2013-08-27 DIAGNOSIS — I429 Cardiomyopathy, unspecified: Secondary | ICD-10-CM

## 2013-08-27 DIAGNOSIS — Z9079 Acquired absence of other genital organ(s): Secondary | ICD-10-CM | POA: Insufficient documentation

## 2013-08-27 LAB — CBC WITH DIFFERENTIAL/PLATELET
BASO%: 0.7 % (ref 0.0–2.0)
Basophils Absolute: 0 10*3/uL (ref 0.0–0.1)
EOS%: 2.9 % (ref 0.0–7.0)
HCT: 31.9 % — ABNORMAL LOW (ref 34.8–46.6)
MCH: 32.9 pg (ref 25.1–34.0)
MCHC: 34.2 g/dL (ref 31.5–36.0)
MCV: 96.1 fL (ref 79.5–101.0)
MONO#: 0.4 10*3/uL (ref 0.1–0.9)
MONO%: 10.3 % (ref 0.0–14.0)
NEUT#: 2.5 10*3/uL (ref 1.5–6.5)
NEUT%: 69.4 % (ref 38.4–76.8)
RBC: 3.32 10*6/uL — ABNORMAL LOW (ref 3.70–5.45)
RDW: 16.8 % — ABNORMAL HIGH (ref 11.2–14.5)
lymph#: 0.6 10*3/uL — ABNORMAL LOW (ref 0.9–3.3)

## 2013-08-27 LAB — MAGNESIUM (CC13): Magnesium: 1.2 mg/dl — CL (ref 1.5–2.5)

## 2013-08-27 LAB — BASIC METABOLIC PANEL (CC13)
BUN: 16.5 mg/dL (ref 7.0–26.0)
CO2: 25 mEq/L (ref 22–29)
Potassium: 3.6 mEq/L (ref 3.5–5.1)
Sodium: 143 mEq/L (ref 136–145)

## 2013-08-27 MED ORDER — CARVEDILOL 3.125 MG PO TABS: 3.1250 mg | ORAL_TABLET | Freq: Two times a day (BID) | ORAL | Status: DC

## 2013-08-27 NOTE — Progress Notes (Signed)
HPI The patient presents for evaluation of chest discomfort. She was actually seen here in 2000 and and did have a stress perfusion study. I reviewed this demonstrated possibly a reduced ejection fraction of 47%. There was artifact. There was perhaps some hypoperfusion of the anterior septum. Cardiac catheterization was planned but this did not happen. I saw her earlier in the year and I ordered dobutamine echocardiogram. This confirmed the ejection fraction was somewhat low at 35%. However, there was no evidence of ischemia or infarct. She was to have followup to discuss this but has been treated for endometrial cancer with chemotherapy and radiation. She found out today she is in remission. She is quite happy about this. She does have some occasional chest discomfort but this is quite infrequent and sporadic. She does not bring this on with activity. She doesn't describe any PND or orthopnea. She's not having any significant shortness of breath. She's had no weight gain or edema. She's not having any palpitations, presyncope or syncope.  No Known Allergies  Current Outpatient Prescriptions  Medication Sig Dispense Refill  . aspirin 81 MG tablet Take 81 mg by mouth every morning.       . bifidobacterium infantis (ALIGN) capsule Take 1 capsule by mouth daily as needed.      [name] dexamethasone (DECADRON) 4 MG tablet Take 5 tablets with food 12 hrs. and 6 hrs. Prior to Taxol chemotherapy.  30 tablet  0  . diazepam (VALIUM) 5 MG tablet Take 1 tablet (5 mg total) by mouth every 8 (eight) hours as needed for anxiety. 1 daily prn back pain or anxiety prior to radiation therapy  30 tablet  0  . diclofenac (VOLTAREN) 75 MG EC tablet Take 1 tablet (75 mg total) by mouth 2 (two) times daily.  30 tablet  0  . emollient (BIAFINE) cream Apply 1 application topically 2 (two) times daily. Apply to affected area after rad txs and bedtime , not 4 hours prior to treatments      . ferrous sulfate 325 (65 FE) MG tablet  325 mg every evening. Takes twice a week      . guaiFENesin 200 MG tablet Take 2 tablets (400 mg total) by mouth every 4 (four) hours as needed for congestion.  30 tablet  0  . HYDROcodone-acetaminophen (NORCO/VICODIN) 5-325 MG per tablet Take 1-2 tablets every 6 hours as needed for severe pain  30 tablet  0  . lactase (LACTAID) 3000 UNITS tablet Take 1 tablet by mouth 3 (three) times daily with meals as needed.      . loperamide (IMODIUM) 2 MG capsule Take 2 mg by mouth 4 (four) times daily as needed for diarrhea or loose stools.      [name] loratadine (CLARITIN) 10 MG tablet Take 10 mg by mouth daily.      [name] LORazepam (ATIVAN) 1 MG tablet Take 1 tablet (1 mg total) by mouth every 8 (eight) hours.  30 tablet  0  . Magnesium Oxide 400 MG CAPS Take 1 capsule (400 mg total) by mouth daily.  30 capsule  1  . ondansetron (ZOFRAN) 8 MG tablet Take 1-2 tablets (8-16 mg total) by mouth every 8 (eight) hours as needed for nausea (Will not make you drowsy).  30 tablet  1  . OVER THE COUNTER MEDICATION Allergy Plus Sinus Headache- acetaminophen 325, diphenhydramine 12.5, phenylephrine 5 mg      . OVER THE COUNTER MEDICATION Medi-phenyl 5 mg      .  phenazopyridine (PYRIDIUM) 200 MG tablet Take 1 tablet (200 mg total) by mouth 3 (three) times daily as needed for pain.  30 tablet  0  . polyethylene glycol powder (MIRALAX) powder Take 17 g by mouth 2 (two) times daily as needed.  255 g  2  . potassium chloride (K-DUR) 10 MEQ tablet Take two tablets twice a day or as directed.  120 tablet  2  . PRESCRIPTION MEDICATION PT STATES SHE HAD HER LAST TREATMENT ON 02-18-13 AND AT THIS TIME PT IS NOT SCHEDULED FOR ANOTHER TREATMENT PENDING HER RADIATION AND CT SCAN, SHE IS FOLLOWED BY DR LIVESAY.      [name] prochlorperazine (COMPAZINE) 10 MG tablet Take 1 tablet (10 mg total) by mouth every 6 (six) hours as needed.  20 tablet  0  . sertraline (ZOLOFT) 100 MG tablet take 1 tablet by mouth once daily  30 tablet  1  . traMADol  (ULTRAM) 50 MG tablet Take 1 tablet (50 mg total) by mouth every 8 (eight) hours as needed for pain.  30 tablet  2   No current facility-administered medications for this visit.    Past Medical History  Diagnosis Date  . Congenital birth defect     Right hand  . Anemia   . Arthritis     knees  . Endometrial cancer   . Anxiety   . Swelling     ANKLES - TAKES LASIX  . Depression   . HLD (hyperlipidemia)     borderline  . Obesity   . Hx of radiation therapy 04/07/13- 05/14/13    pelvis 45 gray 25 fx    Past Surgical History  Procedure Laterality Date  . Hiatal hernia repair    . Repair vaginal cuff  10/07/2012    Procedure: REPAIR VAGINAL CUFF;  Surgeon: Laurette Schimke, MD PHD;  Location: [facility];  Service: Gynecology;;  . Robotic assisted total hysterectomy with bilateral salpingo oopherectomy  10/07/2012    Procedure: ROBOTIC ASSISTED TOTAL HYSTERECTOMY WITH BILATERAL SALPINGO OOPHORECTOMY;  Surgeon: Laurette Schimke, MD PHD;  Location: [facility];  Service: Gynecology;  Laterality: Bilateral;  . Abdominal hysterectomy  10/2011    complete    ROS:  As stated in the history of present illness and negative for all other systems.  PHYSICAL EXAM BP 106/78  Pulse 98  Ht 5\' 6"  (1.676 m)  Wt 271 lb (122.925 kg)  BMI 43.76 kg/m2  LMP 09/20/2012 GENERAL:  Well appearing HEENT:  Pupils equal round and reactive, fundi not visualized, oral mucosa unremarkable NECK:  No jugular venous distention, waveform within normal limits, carotid upstroke brisk and symmetric, no bruits, no thyromegaly LYMPHATICS:  No cervical, inguinal adenopathy LUNGS:  Clear to auscultation bilaterally BACK:  No CVA tenderness CHEST:  Unremarkable HEART:  PMI not displaced or sustained,S1 and S2 within normal limits, no S3, no S4, possible click, no rubs, no murmurs ABD:  Flat, positive bowel sounds normal in frequency in pitch, no bruits, no rebound, no guarding, no midline pulsatile mass, no hepatomegaly, no  splenomegaly EXT:  2 plus pulses throughout, no edema, no cyanosis no clubbing, right hand congenital abnormality SKIN:  No rashes no nodules NEURO:  Cranial nerves II through XII grossly intact, motor grossly intact throughout PSYCH:  Cognitively intact, oriented to person place and time  EKG:  Sinus rhythm, rate 98, axis within normal limits, intervals within normal limits, no acute ST-T wave changes.  Abnormal R wave progression.  08/27/2013  ASSESSMENT AND PLAN  Chest pain - The patient presents with chest discomfort that is somewhat atypical. She has no evidence of ischemia on her stress test. Therefore, no further workup is suggested except as described below.  Cardiomyopathy - I will start carvedilol 3.125 mg twice daily. I will titrate this medicine slowly over time and an ACE inhibitor though her blood pressure is soft. I will followup with another echocardiogram in February.

## 2013-08-27 NOTE — Progress Notes (Signed)
Office Visit:  GYN ONCOLOGY   CC: Endometrial cancer surveillance  HPI:47 y/o G2 P1 LNMP  06/2012.  Patient reports intermittent spotting between menses for over 1 year.  Occasional menorrhagia.  Last pap more than two years prior.  20 pound weight loss over the past year.  Ms. Vien was referred to Dr. Marice Potter who collected an endometrial biopsy and a endocervical biopsy. Both are positive for endometrial adenocarcinoma.  On 10/07/2012 she underwent robotic hysterectomy bilateral salpingo-oophorectomy. At the time of surgery multiple cystic lesions were noted throughout the entire pelvis that was suspicious for metastatic disease, as such lymph node dissection was not collected but these lesions were biopsied. Final pathology was notable for  1. Soft tissue, biopsy, right para-colic gutter - BENIGN PARA-COLIC GUTTER SOFT TISSUE WITH BENIGN CYST (4.0 CM). SEE COMMENT. - NEGATIVE FOR MALIGNANCY. 2. Uterus +/- tubes/ovaries, neoplastic - ENDOMETRIAL ADENOCARCINOMA, ENDOMETRIOID-TYPE, SEE COMMENT. - TUMOR INVADES LESS THAN 1/2 OF MYOMETRIAL THICKNESS.- NO LYMPHOVASCULAR INVASION IDENTIFIED. - TUMOR INVADES INTO LOWER UTERINE SEGMENT. - UTERINE ADENOMYOSIS. - BENIGN RIGHT AND LEFT OVARIES; NO ATYPIA OR MALIGNANCY PRESENT. - BENIGN RIGHT AND LEFT FALLOPIAN TUBES WITH BENIGN SEROUS-TYPE PARATUBAL CYSTS. - BENIGN CERVIX; NEGATIVE FOR INTRAEPITHELIAL LESION OR MALIGNANCY. - BENIGN MULTILOCULAR UTERINE SEROSAL CYST.  CT abdomen and pelvis 11/25/2012  IMPRESSION:  1. Shotty bilateral external iliac lymphadenopathy measuring up to 1.3 cm, suspicious for metastatic disease. Nonspecific less than 5 mm retroperitoneal lymph nodes also noted in the left para-aortic region. 2. 2.7 cm postop lymphocele versus low attenuation lymphadenopathy in the proximal left external iliac chain. 3. Cholelithiasis and right nephrolithiasis incidentally noted.   Ms. Hauk subsequently received sandwich therapy with  chemotherapy consisting of Taxol and carboplatin. This treatment was completed on 07/15/2013. She's doing well reports use of her vaginal dilator denies nausea vomiting abdominal pain, there is no vaginal or rectal bleeding although she does report some constipation.  End of treatment CT 08/25/2013 notable for no evidence of abdominal lymphadenopathy. Is no evidence of inflammatory process abscess or ascites the adnexal regions are unremarkable in appearance there is no new or progressive disease within the abdomen or pelvis   Past Surgical Hx:  Past Surgical History  Procedure Laterality Date  . Hiatal hernia repair    . Repair vaginal cuff  10/07/2012    Procedure: REPAIR VAGINAL CUFF;  Surgeon: Laurette Schimke, MD PHD;  Location: [facility];  Service: Gynecology;;  . Robotic assisted total hysterectomy with bilateral salpingo oopherectomy  10/07/2012    Procedure: ROBOTIC ASSISTED TOTAL HYSTERECTOMY WITH BILATERAL SALPINGO OOPHORECTOMY;  Surgeon: Laurette Schimke, MD PHD;  Location: [facility];  Service: Gynecology;  Laterality: Bilateral;  . Abdominal hysterectomy  10/2011    complete    Past Medical Hx:  Past Medical History  Diagnosis Date  . Congenital birth defect     Right hand  . Anemia   . Arthritis     knees  . Endometrial cancer   . Anxiety   . Swelling     ANKLES - TAKES LASIX  . Depression   . HLD (hyperlipidemia)     borderline  . Obesity   . Hx of radiation therapy 04/07/13- 05/14/13    pelvis 45 gray 25 fx    Past Gynecological History:  G2 P1 Menarche 15, Patient's last menstrual period was 09/20/2012. No h/o abn pap test  Family Hx:  Family History  Problem Relation Age of Onset  . Pancreatic cancer Maternal Grandfather     or  liver cancer  . Hypertension Maternal Grandfather   . Cancer Maternal Grandfather     pancreatic  . Hypertension Maternal Grandmother   . Diabetes Mother   . Colon polyps Maternal Aunt   . Cancer Maternal Aunt     lung  . Colon cancer  Cousin   . Cancer Cousin     lung  . Lung cancer Maternal Aunt    Mat aunt lung cancer Dx 29 (tob use) Mat GF pancreatic cancer Mat cousin lung cancer dx 8's (tob use)  Review of Systems:  Constitutional  Feels well, weight gain, no malaise. Cardiovascular  No chest pain, shortness of breath, or edema  Pulmonary  No cough or wheeze.  Gastro Intestinal  No nausea, vomitting, or diarrhoea. Reports constipation, no rectal bleeding. Genito Urinary  No frequency, urgency, dysuria,no vaginal spotting or discharge.  Is compliant with the use of her vaginal dilator Musculo Skeletal  No myalgia, arthralgia, joint swelling or pain   Physical Exam: BP 130/84  Pulse 98  Temp(Src) 98.9 F (37.2 C) (Oral)  Resp 16  Ht 5' 6.3" (1.684 m)  Wt 272 lb 4.8 oz (123.514 kg)  BMI 43.55 kg/m2  LMP 09/20/2012  WD in NAD CHEST:  CTA CARDIAC:  RRR ABDOMEN :  Soft nontender. Trocar sites done in the intensive mass or hernia.Normoactive bowel sounds, abdomen soft, non-tender and morbidly obese.  BACK:  No CVAT LN:  No cervical supra clavicular or inguinal adenopathy PELVIC:  Nl EGBUS, Vaginal cuff intact.  No discharge, bleeding or tenderness or cul de sac nodularity  Assessment/Plan:  Ms. SHAUNTE TUFT  is a 47 y.o.  year old with grade 2 endometrial adenocarcinoma noted on  the endometrial biopsy and endocervical curettage specimens. She underwent robotic total laparoscopic hysterectomy bilateral salpingo-oophorectomy. At the time of surgery diffuse miliary cystic-like lesions were noted in the cul-de-sac on on the rectosigmoid colon highly suspicious for metastatic disease. Because of these findings lymph node dissection was not performed. Final pathology is notable for grade 2 endometrioid endometrial adenocarcinoma with 12.5% myometrial invasion no lymphovascular space invasion no cervical involvement without confirmation of metastases in the eplic systs.    A CT scan of the abdomen and  pelvis was collected to assess the pelvic and periaortic lymph nodes since these were not surgically assessed.  The imaging is suspicios for pelvic LN involvement, stage IIIC1  She has completed chemoradiation sandwich therapy 07/24/2013.    Stage IIIC1  Treatment completed 07/24/2013.  NED F/U with Dr. Melida Quitter 11/2013 F/U with Dr. Darrold Span as scheduled. F/U with Gyn Onc 02/2014 Counseled on the signs and symptoms of recurrence  Hypomagnesemia: Patient is not compliant with Mg tablets.  Counseled that her levels are low and to take the medication.   Laurette Schimke, MD, PhD 08/27/2013, 11:16 AM

## 2013-08-27 NOTE — Patient Instructions (Addendum)
No evidence of disease. F/U with Dr. Roselind Messier 11/2013 F/U with Gyn Onc 02/2014  Cancer of the Uterus The uterus is part of a woman's reproductive system. It is the hollow, pear-shaped organ where a baby grows. The uterus is in the pelvis between the bladder and the rectum. The narrow, lower portion of the uterus is the cervix. The fallopian tubes extend from either side of the top of the uterus to the ovaries. The wall of the uterus has two layers of tissue. The inner layer, or lining, is the endometrium. The outer layer is muscle tissue called the myometrium. In women of childbearing age, the lining of the uterus grows and thickens each month to prepare for pregnancy. If a woman does not become pregnant, the thick, bloody lining flows out of the body through the vagina. This flow is called menstruation. TYPES OF UTERINE CANCER  The most common type of cancer of the uterus begins in the lining (endometrium). It is called endometrial cancer, uterine cancer, or cancer of the uterus. It is seen in 2% to 3% of women.  A different type of cancer, uterine sarcoma, develops in the muscle (myometrium). Cancer that begins in the cervix is also a different type of cancer.  Rarely, a noncancerous fibroid tumor of the uterus develops into a sarcoma. CAUSES  No one knows the exact causes of uterine cancer. But it is clear that this disease is not contagious. No one can "catch" cancer from another person. Women who get this disease are more likely than other women to have certain risk factors. A risk factor is something that increases a person's chance of developing the disease.  Most women who have known risk factors do not get uterine cancer. On the other hand, many who do get this disease have none of these factors. Doctors can seldom explain why one woman gets uterine cancer and another does not.  Studies have found the following risk factors:  Age. Cancer of the uterus occurs mostly in women over age  30.  Endometrial hyperplasia (enlarged endometrium). The risk of uterine cancer is higher if a woman has endometrial hyperplasia.  Hormone replacement therapy (HRT). HRT is used to control the symptoms of menopause, to prevent osteoporosis (thinning of the bones), and to reduce the risk of heart disease or stroke. Women who still have their uterus, and use estrogen without progesterone, have an increased risk of uterine cancer. Long-term use and large doses of estrogen seem to increase this risk. Women who use a combination of estrogen and progesterone have a lower risk of uterine cancer than women who use estrogen alone. The progesterone protects the uterus from developing cancer.  Obesity and related conditions. The body stores and releases some of its estrogen in fatty tissue. That is why obese women are more likely than thin women to have higher levels of estrogen in their bodies. High levels of estrogen may be the reason that obese women have an increased risk of developing uterine cancer. The risk of this disease is also higher in women with diabetes or high blood pressure. These conditions occur in many obese women.  Tamoxifen. Women taking the drug tamoxifen to prevent or treat breast cancer have an increased risk of uterine cancer. This risk appears to be related to the estrogen-like effect of this drug on the uterus.  Race. White women are more likely than African-American women to get uterine cancer.  Colorectal cancer. Women who have had an inherited form of colorectal cancer have  a higher risk of developing uterine cancer than other women.  Infertility.  Beginning menstrual periods before age 69.  Having menstrual periods after age 37.  History of cancer of the ovary or intestine.  Family history of uterine cancer.  Having diabetes, high blood pressure, thyroid or gallbladder disease.  Long-term use of high does of birth control pills. Birth control pills today are low in  hormone doses.  Radiation to the abdomen or pelvis.  Smoking.   These symptoms can be caused by cancer or other less serious conditions. Most often they are not cancer. But a thorough evaluation is needed to be certain. HOME CARE INSTRUCTIONS   Maintain a normal weight with a healthy balanced diet and exercise.  If you have diabetes, high blood pressure, thyroid or gallbladder disease, keep them in control with your caregiver's treatment and recommendations.  Do not smoke.  Do not take estrogen without taking progesterone with it, for menopausal symptoms.  Join a support group or get counseling, if you would like help dealing with your cancer.  If you are on hormone replacement therapy, see your caregiver as recommended, and be informed about the side effects of HRT.  Women with known risk factors should ask their caregiver what symptoms to look for and how often they should have an examination.  Keep your follow-up appointments and take your medicines as advised.  Write your questions down, and take them with you to your caregiver's appointments.  You may want another person to be with you for your appointments, so you do not miss any instructions.  You have pain with sexual intercourse.  You have stomach or pelvis pain.  You have weight loss for no known reason.  You have pain or difficulty with urination. [facility]  Cancer Information Service (CIS) provides accurate, up-to-date information on cancer to patients and their families, health professionals, and the general public:  Phone: 1-800-4-CANCER (3658059519).  Internet: http://www.cancer.gov NCI's website contains complete information about cancer causes and prevention, screening and diagnosis, treatment and survivorship, clinical trials, statistics, funding, training, and employment opportunities, and [employer] and its programs. CLINICAL TRIALS A woman who is interested in being  part of a clinical trial should talk with her caregiver. NCI's website (http://www.johnson-fowler.biz/) provides general information about clinical trials. It also offers detailed information about specific ongoing studies of uterine cancer by linking to PDQ, a cancer information database developed by the NCI. The Cancer Information Service at 1-800-4-CANCER can answer questions about cancer and provide information from the PDQ database. Document Released: 11/12/2005 Document Revised: 02/04/2012 Document Reviewed: 09/15/2009 [facility] Patient Information 2014 [place], [place].   Lab Results  Component Value Date   WBC 3.6* 08/27/2013   HGB 10.9* 08/27/2013   HCT 31.9* 08/27/2013   MCV 96.1 08/27/2013   PLT 233 08/27/2013     Chemistry      Component Value Date/Time   NA 143 08/27/2013 1016   NA 134* 10/08/2012 0418   K 3.6 08/27/2013 1016   K 3.3* 10/08/2012 0418   CL 103 04/24/2013 1001   CL 98 10/08/2012 0418   CO2 25 08/27/2013 1016   CO2 27 10/08/2012 0418   BUN 16.5 08/27/2013 1016   BUN 8 10/08/2012 0418   CREATININE 0.8 08/27/2013 1016   CREATININE 0.70 10/08/2012 0418   CREATININE 0.77 07/18/2011 1053      Component Value Date/Time   CALCIUM 9.4 08/27/2013 1016   CALCIUM 8.4 10/08/2012 0418   ALKPHOS 76 08/14/2013  0947   ALKPHOS 74 10/03/2012 1020   AST 19 08/14/2013 0947   AST 15 10/03/2012 1020   ALT 32 08/14/2013 0947   ALT 14 10/03/2012 1020   BILITOT 0.45 08/14/2013 0947   BILITOT 0.4 10/03/2012 1020

## 2013-08-27 NOTE — Patient Instructions (Addendum)
Start Carvedilol 3.125 mg one twice a day. Continue all other medications as listed.  Your physician has requested that you have an echocardiogram in 12/2013. Echocardiography is a painless test that uses sound waves to create images of your heart. It provides your doctor with information about the size and shape of your heart and how well your heart's chambers and valves are working. This procedure takes approximately one hour. There are no restrictions for this procedure.  Follow up in 1 month with either Dr Antoine Poche or Tereso Newcomer, PA.

## 2013-08-28 ENCOUNTER — Telehealth: Payer: Self-pay

## 2013-08-28 NOTE — Telephone Encounter (Signed)
Message copied by Lorine Bears on Fri Aug 28, 2013  9:58 AM ------      Message from: Reece Packer      Created: Thu Aug 27, 2013  8:06 PM       Labs seen and need follow up: please tell patient that she needs to continue both K and Mg as best she can. The K is low normal and magnesium is still low. She should not take the fluid pill; note cardiologist does not recommend fluid pill either.      Cc LA, TH, RW-S ------

## 2013-08-28 NOTE — Telephone Encounter (Signed)
Spoke with Deborah [name] and told her to continue supplements as noted below by Dr. Darrold Span.

## 2013-09-23 ENCOUNTER — Ambulatory Visit (HOSPITAL_BASED_OUTPATIENT_CLINIC_OR_DEPARTMENT_OTHER): Payer: Medicaid Other | Admitting: Oncology

## 2013-09-23 ENCOUNTER — Other Ambulatory Visit (HOSPITAL_BASED_OUTPATIENT_CLINIC_OR_DEPARTMENT_OTHER): Payer: Medicaid Other | Admitting: Lab

## 2013-09-23 ENCOUNTER — Encounter: Payer: Self-pay | Admitting: Oncology

## 2013-09-23 ENCOUNTER — Telehealth: Payer: Self-pay | Admitting: Oncology

## 2013-09-23 VITALS — BP 121/86 | HR 100 | Temp 98.8°F | Resp 20 | Ht 66.0 in | Wt 271.6 lb

## 2013-09-23 DIAGNOSIS — E876 Hypokalemia: Secondary | ICD-10-CM

## 2013-09-23 DIAGNOSIS — C549 Malignant neoplasm of corpus uteri, unspecified: Secondary | ICD-10-CM

## 2013-09-23 DIAGNOSIS — C541 Malignant neoplasm of endometrium: Secondary | ICD-10-CM

## 2013-09-23 LAB — COMPREHENSIVE METABOLIC PANEL (CC13)
ALT: 16 U/L (ref 0–55)
AST: 13 U/L (ref 5–34)
Albumin: 3.4 g/dL — ABNORMAL LOW (ref 3.5–5.0)
Anion Gap: 14 mEq/L — ABNORMAL HIGH (ref 3–11)
BUN: 18.2 mg/dL (ref 7.0–26.0)
CO2: 22 mEq/L (ref 22–29)
Glucose: 96 mg/dl (ref 70–140)
Potassium: 3.4 mEq/L — ABNORMAL LOW (ref 3.5–5.1)
Sodium: 141 mEq/L (ref 136–145)
Total Bilirubin: 0.31 mg/dL (ref 0.20–1.20)
Total Protein: 8.4 g/dL — ABNORMAL HIGH (ref 6.4–8.3)

## 2013-09-23 LAB — CBC WITH DIFFERENTIAL/PLATELET
BASO%: 0 % (ref 0.0–2.0)
Basophils Absolute: 0 10*3/uL (ref 0.0–0.1)
EOS%: 2.2 % (ref 0.0–7.0)
HCT: 36.3 % (ref 34.8–46.6)
HGB: 12.3 g/dL (ref 11.6–15.9)
LYMPH%: 19.7 % (ref 14.0–49.7)
MCH: 31.9 pg (ref 25.1–34.0)
MCHC: 33.9 g/dL (ref 31.5–36.0)
MONO%: 12.7 % (ref 0.0–14.0)
NEUT%: 65.4 % (ref 38.4–76.8)
Platelets: 249 10*3/uL (ref 145–400)
WBC: 3.7 10*3/uL — ABNORMAL LOW (ref 3.9–10.3)
lymph#: 0.7 10*3/uL — ABNORMAL LOW (ref 0.9–3.3)

## 2013-09-23 NOTE — Patient Instructions (Signed)
Try to increase walking to 5000 (five thousand) steps daily.  Healthy foods only: no sodas, no fried or greasy foods, lots of fruit and vegetables

## 2013-09-23 NOTE — Telephone Encounter (Signed)
, °

## 2013-09-23 NOTE — Progress Notes (Signed)
OFFICE PROGRESS NOTE   09/23/2013   Physicians:W.Brewster, J.Kinard, M.Billey Co (PCP [facility]) J.Hochrein   INTERVAL HISTORY:  Patient is seen, alone for visit, in follow up of adjuvant chemotherapy given for IIIC grade 2 endometrial cancer, particularly as she was still anemic by CBC in early Oct. She had repeat CT AP 08-25-13 which did not show obvious cancer, and saw Dr Nelly Rout 08-27-13. She is to see Dr Roselind Messier next in Jan and Dr Nelly Rout in April 2015. Patient has felt gradually better overall, with improvement in peripheral neuropathy symptoms, no longer any diarrhea, better energy. Dr Antoine Poche is following previous low EF.   ONCOLOGIC HISTORY Patient presented with spotting and menorrhagia, last PAP 2 years prior. Biopsies by Dr Nicholaus Bloom 09-22-2012 (path [id number]) showed endometroid adenocarcinoma in endometrial biopsy and in endocervical curettage. She went to robotic hysterectomy with BSO by Dr Nelly Rout on 10-07-12 (no lymph nodes sampled as serosal cysts appeared malignant). Pathology [phone]) showed grade 2 endometroid adenocarcinoma with involvement of < 1/2 of myometrium, no LVSI, did involve into lower uterine segment, ovaries and tubes benign and the serosal cysts benign. CT AP 11-25-12 showed shotty bilateral external iliac nodes up to 1.3 cm and nonspecific < 5 mm left para-aortic nodes. She saw Dr Nelly Rout following the CT, with recommendation for chemotherapy and radiation in sandwich fashion. Patient cancelled her new patient visit with me in Jan, that rescheduled. Taxol/carboplatin was begun 01-07-13, with 3 cycles thru 02-18-13 with gCSF support, then IMRT 45 cGy to pelvis by Dr Roselind Messier from 5-13 thru 05-14-13. She missed appointments for medical oncology and chemotherapy in June, did resume chemotherapy with cycle 4 on 06-11-13 and completed course with cycle 6 on 07-24-13, with neulasta.   Review of systems as above, also: No fever or symptoms of infection. No  pain other than chronic low back symptoms, which are not as bothersome now. No swelling LE. No SOB tho some sinus congestion consistent with environmental allergies.  Remainder of 10 point Review of Systems negative.  Objective:  Vital signs in last 24 hours:  BP 121/86  Pulse 100  Temp(Src) 98.8 F (37.1 C) (Oral)  Resp 20  Ht 5\' 6"  (1.676 m)  Wt 271 lb 9.6 oz (123.197 kg)  BMI 43.86 kg/m2  LMP 09/20/2012  Alert, oriented and appropriate. Ambulatory without difficulty and more easily mobile overall.   HEENT:PERRL, sclerae not icteric. Oral mucosa moist without lesions, posterior pharynx clear.  Neck supple. No JVD.  Lymphatics:no cervical,suraclavicular, axillary or inguinal adenopathy Resp: clear to auscultation bilaterally and normal percussion bilaterally Cardio: regular rate and rhythm. No gallop. GI: pbese. soft, nontender, not distended, no appreciable mass or organomegaly. Diminished bowel sounds. Surgical incision not remarkable. Musculoskeletal/ Extremities: without pitting edema, cords, tenderness. Congenital deformity right hand Neuro: no peripheral neuropathy. Otherwise nonfocal Skin without rash, ecchymosis, petechiae Breasts without dominant mass, skin or nipple findings.  Lab Results:  Results for orders placed in visit on 09/23/13  COMPREHENSIVE METABOLIC PANEL (CC13)      Result Value Range   Sodium 141  136 - 145 mEq/L   Potassium 3.4 (*) 3.5 - 5.1 mEq/L   Chloride 105  98 - 109 mEq/L   CO2 22  22 - 29 mEq/L   Glucose 96  70 - 140 mg/dl   BUN 82.9  7.0 - 56.2 mg/dL   Creatinine 0.8  0.6 - 1.1 mg/dL   Total Bilirubin 1.30  0.20 - 1.20 mg/dL   Alkaline Phosphatase 68  40 - 150 U/L   AST 13  5 - 34 U/L   ALT 16  0 - 55 U/L   Total Protein 8.4 (*) 6.4 - 8.3 g/dL   Albumin 3.4 (*) 3.5 - 5.0 g/dL   Calcium 63.0  8.4 - 16.0 mg/dL   Anion Gap 14 (*) 3 - 11 mEq/L  CBC WITH DIFFERENTIAL      Result Value Range   WBC 3.7 (*) 3.9 - 10.3 10e3/uL   NEUT# 2.4   1.5 - 6.5 10e3/uL   HGB 12.3  11.6 - 15.9 g/dL   HCT 10.9  32.3 - 55.7 %   Platelets 249  145 - 400 10e3/uL   MCV 94.3  79.5 - 101.0 fL   MCH 31.9  25.1 - 34.0 pg   MCHC 33.9  31.5 - 36.0 g/dL   RBC 3.22  0.25 - 4.27 10e6/uL   RDW 13.6  11.2 - 14.5 %   lymph# 0.7 (*) 0.9 - 3.3 10e3/uL   MONO# 0.5  0.1 - 0.9 10e3/uL   Eosinophils Absolute 0.1  0.0 - 0.5 10e3/uL   Basophils Absolute 0.0  0.0 - 0.1 10e3/uL   NEUT% 65.4  38.4 - 76.8 %   LYMPH% 19.7  14.0 - 49.7 %   MONO% 12.7  0.0 - 14.0 %   EOS% 2.2  0.0 - 7.0 %   BASO% 0.0  0.0 - 2.0 %   nRBC 0  0 - 0 %  MAGNESIUM (CC13)      Result Value Range   Magnesium 1.3 (*) 1.5 - 2.5 mg/dl     Studies/Results:  CT AP 08-25-2013 reviewed in EMR  6 month follow up mammogram at [facility] 08-20-13 consistent with bilateral fibroadenomas felt benign, however radiologists recommend 6 month follow up US (March 2015)  Medications: I have reviewed the patient's current medications. Magnesium still low and she will be contacted to continue magnesium and potassium supplements, and to have this followed up by PCP in ~ a month  DISCUSSION: we have discussed health benefits of losing weight to ideal, by diet and exercise. This is particularly important from standpoint of the gyn cancer, as discussed again now.  She declines flu vaccine.  Assessment/Plan:  1.IIIC endometrial carcinoma: post hysterectomy/BSO 10-07-12 with serosal cysts benign but adenopathy by subsequent CT. She has had Taxol/carboplatin x 3 cycles from 01-07-13 thru 02-18-13 with neulasta, IMRT by Dr Roselind Messier thru 05-13-13, and completed 3 additional cycles of taxol carboplatin chemotherapy thru 07-24-13. I will see her back in ~ 4 months to follow labs and morbid obesity recommendations 2. 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  4. declines flu vaccine despite recommendation again now  5.degenerative arthritis  6.morbid  obesity: ongoing education for weight loss. I have told her that she needs to weigh < 200 lbs. As initial goal, tho this would still not be at ideal weight. Unfortunately we do not have any specific programs thru [facility] to assist more with this need (GOG study of diet and exercise does not include endometrial diagnosis) 7.hypokalemia and hypomagnesemia as above. Lasix seems to have been discontinued. Continue supplements. I will recheck at my next visit, but should have PCP repeat in interim also. 8. Follow up US bilateral breasts recommended in March 2015  Patient is in agreement with plan and appreciative of care.    Khang Hannum P, MD   09/23/2013, 1:35 PM

## 2013-10-01 ENCOUNTER — Ambulatory Visit (INDEPENDENT_AMBULATORY_CARE_PROVIDER_SITE_OTHER): Payer: Medicaid Other | Admitting: Physician Assistant

## 2013-10-01 ENCOUNTER — Encounter: Payer: Self-pay | Admitting: Physician Assistant

## 2013-10-01 VITALS — BP 140/84 | HR 91 | Ht 66.0 in | Wt 272.0 lb

## 2013-10-01 DIAGNOSIS — I255 Ischemic cardiomyopathy: Secondary | ICD-10-CM

## 2013-10-01 DIAGNOSIS — I428 Other cardiomyopathies: Secondary | ICD-10-CM

## 2013-10-01 DIAGNOSIS — I429 Cardiomyopathy, unspecified: Secondary | ICD-10-CM

## 2013-10-01 DIAGNOSIS — I2589 Other forms of chronic ischemic heart disease: Secondary | ICD-10-CM

## 2013-10-01 MED ORDER — LISINOPRIL 2.5 MG PO TABS: 2.5000 mg | ORAL_TABLET | Freq: Every day | ORAL | Status: DC

## 2013-10-01 NOTE — Patient Instructions (Signed)
Your physician has recommended you make the following change in your medication: START LISINOPRIL ( 2.5 MG) DAILY THIS WAS SENT INTO [facility] [address]  Your physician recommends that you KEEP YOUR follow-up appointment WITH DR. HOCHREIN ON Thursday, 12/18 @ 4:00  Your physician recommends that you HAVE lab work AT DR. SONNENBERG NEXT WEEK AT YOUR APPOINTMENT ON 11/12 @ BMET. GAVE PT A PAPER SCRIPT TODAY

## 2013-10-01 NOTE — Progress Notes (Signed)
[address] [place], [place]  [address] Phone: [phone] Fax:  [phone]  Date:  10/01/2013   ID:  Deborah [name], DOB 1966-06-15, MRN [id number]  PCP:  Marikay Alar, MD  Cardiologist:  Dr. Rollene Rotunda     History of Present Illness: Deborah [name] is a 47 y.o. female with a history of chest pain, HL, endometrial CA status post TAH/BSO. She was seen in January 2014 by Dr. Antoine Poche. She had been seen in the past by Dr. Juanda Chance for chest pain.  Adenosine Myoview (08/2009):  EF 47%, scar with per-ischemia in the inferior wall.  Cardiac catheterization was recommended but never performed.    Dr. Antoine Poche arranged stress testing.  Dobutamine echo (12/2012): Baseline EF 35%; no stress-induced ischemia (normalization of LV function with stress).  Chest pain was felt to be atypical and no further ischemic evaluation recommended. She was placed on carvedilol when last seen for treatment of her cardiomyopathy.  She is doing well.  She continues to have occasional atypical chest pain without change.  She denies significant dyspnea.  She is NYHA Class II. No orthopnea, PND.  She has mild ankle edema that is stable.  No syncope.   Recent Labs: 07/17/2013: HDL 32*; LDL (calc) 100*  84/13/2440: ALT 16; Creatinine 0.8; Hemoglobin 12.3; Potassium 3.4*   Wt Readings from Last 3 Encounters:  09/23/13 271 lb 9.6 oz (123.197 kg)  08/27/13 271 lb (122.925 kg)  08/27/13 272 lb 4.8 oz (123.514 kg)     Past Medical History  Diagnosis Date  . Congenital birth defect     Right hand  . Anemia   . Arthritis     knees  . Endometrial cancer   . Anxiety   . Swelling     ANKLES - TAKES LASIX  . Depression   . HLD (hyperlipidemia)     borderline  . Obesity   . Hx of radiation therapy 04/07/13- 05/14/13    pelvis 45 gray 25 fx    Current Outpatient Prescriptions  Medication Sig Dispense Refill  . aspirin 81 MG tablet Take 81 mg by mouth every morning.       . bifidobacterium  infantis (ALIGN) capsule Take 1 capsule by mouth daily as needed.      . carvedilol (COREG) 3.125 MG tablet Take 1 tablet (3.125 mg total) by mouth 2 (two) times daily.  60 tablet  11  . diazepam (VALIUM) 5 MG tablet Take 1 tablet (5 mg total) by mouth every 8 (eight) hours as needed for anxiety. 1 daily prn back pain or anxiety prior to radiation therapy  30 tablet  0  . diclofenac (VOLTAREN) 75 MG EC tablet Take 1 tablet (75 mg total) by mouth 2 (two) times daily.  30 tablet  0  . emollient (BIAFINE) cream Apply 1 application topically 2 (two) times daily. Apply to affected area after rad txs and bedtime , not 4 hours prior to treatments      . ferrous sulfate 325 (65 FE) MG tablet 325 mg every evening. Takes twice a week      . guaiFENesin 200 MG tablet Take 2 tablets (400 mg total) by mouth every 4 (four) hours as needed for congestion.  30 tablet  0  . HYDROcodone-acetaminophen (NORCO/VICODIN) 5-325 MG per tablet Take 1-2 tablets every 6 hours as needed for severe pain  30 tablet  0  . lactase (LACTAID) 3000 UNITS tablet Take 1 tablet by mouth 3 (three)  times daily with meals as needed.      . loperamide (IMODIUM) 2 MG capsule Take 2 mg by mouth 4 (four) times daily as needed for diarrhea or loose stools.      [name] loratadine (CLARITIN) 10 MG tablet Take 10 mg by mouth daily.      [name] LORazepam (ATIVAN) 1 MG tablet Take 1 tablet (1 mg total) by mouth every 8 (eight) hours.  30 tablet  0  . Magnesium Oxide 400 MG CAPS Take 1 capsule (400 mg total) by mouth daily.  30 capsule  1  . ondansetron (ZOFRAN) 8 MG tablet Take 1-2 tablets (8-16 mg total) by mouth every 8 (eight) hours as needed for nausea (Will not make you drowsy).  30 tablet  1  . OVER THE COUNTER MEDICATION Allergy Plus Sinus Headache- acetaminophen 325, diphenhydramine 12.5, phenylephrine 5 mg      . OVER THE COUNTER MEDICATION Medi-phenyl 5 mg      . phenazopyridine (PYRIDIUM) 200 MG tablet Take 1 tablet (200 mg total) by mouth 3 (three)  times daily as needed for pain.  30 tablet  0  . polyethylene glycol powder (MIRALAX) powder Take 17 g by mouth 2 (two) times daily as needed.  255 g  2  . potassium chloride (K-DUR) 10 MEQ tablet Take two tablets twice a day or as directed.  120 tablet  2  . prochlorperazine (COMPAZINE) 10 MG tablet Take 1 tablet (10 mg total) by mouth every 6 (six) hours as needed.  20 tablet  0  . sertraline (ZOLOFT) 100 MG tablet take 1 tablet by mouth once daily  30 tablet  1  . traMADol (ULTRAM) 50 MG tablet Take 1 tablet (50 mg total) by mouth every 8 (eight) hours as needed for pain.  30 tablet  2   No current facility-administered medications for this visit.    Allergies:   Review of patient's allergies indicates no known allergies.   Social History:  The patient  reports that she has never smoked. She has never used smokeless tobacco. She reports that she drinks alcohol. She reports that she does not use illicit drugs.   Family History:  The patient's family history includes Cancer in her cousin, maternal aunt, and maternal grandfather; Colon cancer in her cousin; Colon polyps in her maternal aunt; Diabetes in her mother; Hypertension in her maternal grandfather and maternal grandmother; Lung cancer in her maternal aunt; Pancreatic cancer in her maternal grandfather.   ROS:  Please see the history of present illness.      All other systems reviewed and negative.   PHYSICAL EXAM: VS:  LMP 09/20/2012 Well nourished, well developed, in no acute distress HEENT: normal Neck: no JVD Cardiac:  normal S1, S2; RRR; no murmur Lungs:  clear to auscultation bilaterally, no wheezing, rhonchi or rales Abd: soft, nontender, no hepatomegaly Ext: no edema Skin: warm and dry Neuro:  CNs 2-12 intact, no focal abnormalities noted  EKG:  NSR, HR 91, normal axis, nonspecific ST-T wave changes, PACs, no change from prior tracing     ASSESSMENT AND PLAN:  1. Cardiomyopathy:  Volume appears stable.  Tolerating  Coreg.  Will add Lisinopril 2.5 QD.  Check BMET in 1 week.  Plan f/u echo in 12/2013. 2. Disposition:  F/u with Dr. Rollene Rotunda or me in 1 month.   Signed, Tereso Newcomer, PA-C  10/01/2013 9:56 AM

## 2013-10-07 ENCOUNTER — Ambulatory Visit: Payer: Medicaid Other | Admitting: Family Medicine

## 2013-10-12 ENCOUNTER — Telehealth: Payer: Self-pay | Admitting: Oncology

## 2013-10-12 NOTE — Telephone Encounter (Signed)
Called in refill for diazepam (VALIUM) 5 MG tablet 30 tablet Sig - Route: Take 1 tablet (5 mg total) by mouth every 8 (eight) hours as needed for anxiety. 0 refills.  Called Derionna back to let her know it had been called in.

## 2013-10-12 NOTE — Telephone Encounter (Signed)
Deborah [name] called and requested a refill on her diazepam 5mg  po.  She asked that it be called in to [facility] in [place].

## 2013-10-14 NOTE — Telephone Encounter (Signed)
error 

## 2013-10-20 ENCOUNTER — Other Ambulatory Visit: Payer: Self-pay | Admitting: *Deleted

## 2013-10-20 DIAGNOSIS — C541 Malignant neoplasm of endometrium: Secondary | ICD-10-CM

## 2013-10-20 MED ORDER — MAGNESIUM OXIDE 400 MG PO CAPS: 400.0000 mg | ORAL_CAPSULE | Freq: Every day | ORAL | Status: DC

## 2013-10-20 NOTE — Telephone Encounter (Signed)
Pt called to say she only has 1 magnesium tablet left. She is on the "cancellation list" to get in with PCP next week. Per Dr Precious Reel note, PCP is to check magnesium and potassium. RN sent in refill to patient's pharmacy for 30 tablets, no refills.

## 2013-10-26 ENCOUNTER — Telehealth: Payer: Self-pay | Admitting: Cardiology

## 2013-10-26 NOTE — Telephone Encounter (Signed)
OK to hold ACE inhibitor for now to see if the headaches go away.  She needs to have follow up so that we can consider restarting an ACE or using an ARB.

## 2013-10-26 NOTE — Telephone Encounter (Signed)
New message  Since patient has been taking Linispriol she is experiencing headaches. She is very concerned. She would like someone to give her a call. Please call patient and advise.

## 2013-10-26 NOTE — Telephone Encounter (Signed)
Left message for pt OK to hold Lisinopril to see if h/a are relieved.  Advised pt to keep appt as scheduled, to keep a blood pressure diary and bring it with her to her appointment.  She should call back if questions or concerns.

## 2013-10-27 ENCOUNTER — Telehealth: Payer: Self-pay | Admitting: Cardiology

## 2013-10-27 NOTE — Telephone Encounter (Signed)
Follow up    Pt called back she did not understand the message and would like to speak to you before end of the business day today please.

## 2013-10-27 NOTE — Telephone Encounter (Signed)
Reviewed instructions with pt to hold Lisinopril for now to see if her headaches go away.  Discussed with her the reason she needs the medication and its importance if she can tolerate it.

## 2013-11-09 ENCOUNTER — Telehealth: Payer: Self-pay | Admitting: Oncology

## 2013-11-09 ENCOUNTER — Ambulatory Visit (INDEPENDENT_AMBULATORY_CARE_PROVIDER_SITE_OTHER): Payer: Medicaid Other | Admitting: Family Medicine

## 2013-11-09 ENCOUNTER — Encounter: Payer: Self-pay | Admitting: Family Medicine

## 2013-11-09 VITALS — BP 125/88 | HR 92 | Temp 97.7°F | Wt 275.8 lb

## 2013-11-09 DIAGNOSIS — Z8639 Personal history of other endocrine, nutritional and metabolic disease: Secondary | ICD-10-CM

## 2013-11-09 DIAGNOSIS — Z1211 Encounter for screening for malignant neoplasm of colon: Secondary | ICD-10-CM

## 2013-11-09 DIAGNOSIS — Z862 Personal history of diseases of the blood and blood-forming organs and certain disorders involving the immune mechanism: Secondary | ICD-10-CM

## 2013-11-09 DIAGNOSIS — G44209 Tension-type headache, unspecified, not intractable: Secondary | ICD-10-CM

## 2013-11-09 DIAGNOSIS — R109 Unspecified abdominal pain: Secondary | ICD-10-CM

## 2013-11-09 LAB — MAGNESIUM: Magnesium: 1.3 mg/dL — ABNORMAL LOW (ref 1.5–2.5)

## 2013-11-09 LAB — BASIC METABOLIC PANEL
BUN: 13 mg/dL (ref 6–23)
Calcium: 9.6 mg/dL (ref 8.4–10.5)
Creat: 0.83 mg/dL (ref 0.50–1.10)
Glucose, Bld: 89 mg/dL (ref 70–99)

## 2013-11-09 LAB — CBC
MCHC: 35.2 g/dL (ref 30.0–36.0)
Platelets: 126 10*3/uL — ABNORMAL LOW (ref 150–400)
RDW: 13.9 % (ref 11.5–15.5)

## 2013-11-09 MED ORDER — DOCUSATE SODIUM 50 MG PO CAPS: 50.0000 mg | ORAL_CAPSULE | Freq: Two times a day (BID) | ORAL | Status: DC

## 2013-11-09 MED ORDER — LORATADINE 10 MG PO TABS: 10.0000 mg | ORAL_TABLET | Freq: Every day | ORAL | Status: DC

## 2013-11-09 MED ORDER — MOMETASONE FUROATE 50 MCG/ACT NA SUSP: 2.0000 | Freq: Every day | NASAL | Status: DC

## 2013-11-09 NOTE — Patient Instructions (Signed)
Nice to meet you. Your headache may be related to sinus congestion. Please try the nasal spray and claratin for this. If you develop weakness, numbness, tingling, dizziness, or unsteadiness please seek medical attention. Your abdominal pain may be related to constipation. Please take the stool softener twice a day for the next 5 days.   Abdominal Pain Many things can cause belly (abdominal) pain. Most times, the belly pain is not dangerous. The amount of belly pain does not tell how serious the problem may be. Many cases of belly pain can be watched and treated at home. HOME CARE   Do not take medicines that help you go poop (laxatives) unless told to by your doctor.  Only take medicine as told by your doctor.  Eat or drink as told by your doctor. Your doctor will tell you if you should be on a special diet. GET HELP RIGHT AWAY IF:   The pain does not go away.  You have a fever.  You keep throwing up (vomiting).  The pain changes and is only in the right or left part of the belly.  You have bloody or tarry looking poop. MAKE SURE YOU:   Understand these instructions.  Will watch your condition.  Will get help right away if you are not doing well or get worse. Document Released: 04/30/2008 Document Revised: 02/04/2012 Document Reviewed: 11/28/2009 [facility] Patient Information 2014 [place], [place].

## 2013-11-09 NOTE — Telephone Encounter (Signed)
, °

## 2013-11-10 ENCOUNTER — Telehealth: Payer: Self-pay | Admitting: Family Medicine

## 2013-11-10 MED ORDER — POLYETHYLENE GLYCOL 3350 17 GM/SCOOP PO POWD: 17.0000 g | Freq: Every day | ORAL | Status: DC | PRN

## 2013-11-10 NOTE — Telephone Encounter (Signed)
Pt called. Colace is not covered  By Medicaid Needs mirolax called in  Pharmacy: [facility] [address]

## 2013-11-10 NOTE — Telephone Encounter (Signed)
Miralax sent in

## 2013-11-10 NOTE — Telephone Encounter (Signed)
Pt is aware.  Jazmin Hartsell,CMA  

## 2013-11-11 ENCOUNTER — Telehealth: Payer: Self-pay | Admitting: *Deleted

## 2013-11-11 ENCOUNTER — Other Ambulatory Visit: Payer: Self-pay | Admitting: Family Medicine

## 2013-11-11 DIAGNOSIS — D696 Thrombocytopenia, unspecified: Secondary | ICD-10-CM

## 2013-11-11 DIAGNOSIS — Z1211 Encounter for screening for malignant neoplasm of colon: Secondary | ICD-10-CM | POA: Insufficient documentation

## 2013-11-11 DIAGNOSIS — R109 Unspecified abdominal pain: Secondary | ICD-10-CM | POA: Insufficient documentation

## 2013-11-11 DIAGNOSIS — C541 Malignant neoplasm of endometrium: Secondary | ICD-10-CM

## 2013-11-11 MED ORDER — MAGNESIUM OXIDE 400 MG PO CAPS: 400.0000 mg | ORAL_CAPSULE | Freq: Two times a day (BID) | ORAL | Status: DC

## 2013-11-11 NOTE — Telephone Encounter (Signed)
Message copied by Henri Medal on Wed Nov 11, 2013 12:05 PM ------      Message from: Birdie Sons, ERIC G      Created: Wed Nov 11, 2013 11:27 AM       Patient with low magnesium. I will send in a prescription for magnesium supplement to be taken. She should return to clinic to have this rechecked on Friday. She should also have her CBC rechecked at that time given that her platelets are slightly low. Please inform the patient. ------

## 2013-11-11 NOTE — Assessment & Plan Note (Addendum)
Headache with tension like characteristics, though also with photophobia and phonophobia making migraine like headache a possibility. Patient also with sinus congestion making sinus headache a possibility. Will give a trial of nasal steroid spray and oral antihistamine for potential sinus component. To return to care if not improved on this regimen. Red flags for return to care discussed.

## 2013-11-11 NOTE — Telephone Encounter (Signed)
Pt is already 400mg  of magnesium daily.  She will pick up rx and take it twice a day.  States that she will call her oncologist to let them know about labs.  Will also call back to schedule a lab appt due to having other appts this week. Khamani Fairley,CMA

## 2013-11-11 NOTE — Assessment & Plan Note (Signed)
No red flags on exam. Abdominal pain potentially related to constipation given history of straining. Will give trial of miralax to see if this helps. Must consider red flag of previous history of endometrial cancer and radiation if treatment for constipation is not successful. Patient to follow-up in 2 weeks for this issue.

## 2013-11-11 NOTE — Progress Notes (Signed)
Patient ID: Deborah [name], female   DOB: 06-Dec-1965, 47 y.o.   MRN: [id number]  Deborah Alar, MD Phone: [phone]  Deborah [name] is a 47 y.o. female who presents today for discussion of headache and abdominal pain.  Headache: patient notes this occurs over bilateral temples, frontal, and back of head. Notes every other day. For the past 3 months. Notes congestion and pressure behind her eyes. She has tried tramadol and ibuprofen without benefit. Tylenol has helped some. Notes they were worse while on tramadol. Notes photophobia and phonophobia with these.  Abdominal pain: notes pain over navel and occasionally sides of stomach. Described as intermittent and sharp for the past 2 weeks. Notes improves after a BM. Notes hysterectomy for endometrial cancer 10/07/12. Treated with radiation in June 2014. Notes BM most days, though strains a lot. Denies blood in BM or urine. No dysuria, frequency, urgency, or discharge. No diarrhea or N/V.  Patient also requesting referral for colonoscopy. Patient does not have personal or family history of colon cancer per her report. She states given her uterine cancer history she would like to be sure nothing else is going on.  Past Medical History  Diagnosis Date  . Congenital birth defect     Right hand  . Anemia   . Arthritis     knees  . Endometrial cancer   . Anxiety   . Swelling     ANKLES - TAKES LASIX  . Depression   . HLD (hyperlipidemia)     borderline  . Obesity   . Hx of radiation therapy 04/07/13- 05/14/13    pelvis 45 gray 25 fx    History  Smoking status  . Never Smoker   Smokeless tobacco  . Never Used    Family History  Problem Relation Age of Onset  . Pancreatic cancer Maternal Grandfather     or liver cancer  . Hypertension Maternal Grandfather   . Cancer Maternal Grandfather     pancreatic  . Hypertension Maternal Grandmother   . Diabetes Mother   . Colon polyps Maternal Aunt   . Cancer Maternal Aunt     lung    . Colon cancer Cousin   . Cancer Cousin     lung  . Lung cancer Maternal Aunt     Current Outpatient Prescriptions on File Prior to Visit  Medication Sig Dispense Refill  . aspirin 81 MG tablet Take 81 mg by mouth every morning.       . bifidobacterium infantis (ALIGN) capsule Take 1 capsule by mouth daily as needed.      . carvedilol (COREG) 3.125 MG tablet Take 1 tablet (3.125 mg total) by mouth 2 (two) times daily.  60 tablet  11  . diazepam (VALIUM) 5 MG tablet Take 5 mg by mouth every 8 (eight) hours as needed for anxiety. 1 daily prn back pain or anxiety prior to radiation therapy      . diclofenac (VOLTAREN) 75 MG EC tablet Take 1 tablet (75 mg total) by mouth 2 (two) times daily.  30 tablet  0  . emollient (BIAFINE) cream Apply 1 application topically 2 (two) times daily. Apply to affected area after rad txs and bedtime , not 4 hours prior to treatments      . ferrous sulfate 325 (65 FE) MG tablet 325 mg every evening. Takes twice a week      . guaiFENesin 200 MG tablet Take 2 tablets (400 mg total) by mouth every 4 (four) hours  as needed for congestion.  30 tablet  0  . HYDROcodone-acetaminophen (NORCO/VICODIN) 5-325 MG per tablet Take 1-2 tablets every 6 hours as needed for severe pain  30 tablet  0  . lactase (LACTAID) 3000 UNITS tablet Take 1 tablet by mouth 3 (three) times daily with meals as needed.      [name] lisinopril (PRINIVIL,ZESTRIL) 2.5 MG tablet Take 1 tablet (2.5 mg total) by mouth daily.  30 tablet  6  . loperamide (IMODIUM) 2 MG capsule Take 2 mg by mouth 4 (four) times daily as needed for diarrhea or loose stools.      [name] LORazepam (ATIVAN) 1 MG tablet Take 1 tablet (1 mg total) by mouth every 8 (eight) hours.  30 tablet  0  . ondansetron (ZOFRAN) 8 MG tablet Take 1-2 tablets (8-16 mg total) by mouth every 8 (eight) hours as needed for nausea (Will not make you drowsy).  30 tablet  1  . OVER THE COUNTER MEDICATION Allergy Plus Sinus Headache- acetaminophen 325,  diphenhydramine 12.5, phenylephrine 5 mg      . OVER THE COUNTER MEDICATION Medi-phenyl 5 mg      . phenazopyridine (PYRIDIUM) 200 MG tablet Take 1 tablet (200 mg total) by mouth 3 (three) times daily as needed for pain.  30 tablet  0  . potassium chloride (K-DUR) 10 MEQ tablet Take two tablets twice a day or as directed.  120 tablet  2  . prochlorperazine (COMPAZINE) 10 MG tablet Take 1 tablet (10 mg total) by mouth every 6 (six) hours as needed.  20 tablet  0  . sertraline (ZOLOFT) 100 MG tablet take 1 tablet by mouth once daily  30 tablet  1  . traMADol (ULTRAM) 50 MG tablet Take 1 tablet (50 mg total) by mouth every 8 (eight) hours as needed for pain.  30 tablet  2   No current facility-administered medications on file prior to visit.    ROS: Per HPI   Physical Exam Filed Vitals:   11/09/13 0907  BP: 125/88  Pulse: 92  Temp: 97.7 F (36.5 C)    Physical Examination: General appearance - alert, well appearing, and in no distress Eyes - pupils equal and reactive, extraocular eye movements intact Mouth - mucous membranes moist, pharynx normal without lesions Chest - clear to auscultation, no wheezes, rales or rhonchi, symmetric air entry Heart - normal rate, regular rhythm, normal S1, S2, no murmurs, rubs, clicks or gallops Abdomen - tenderness noted periumbilically no rebound tenderness noted Neurological - CN 2-12 intact, 5/5 strength bilateral biceps, triceps, deltoids, grip, hip flexors, abductors, and adductors, quads, hamstrings, plantar and dorsiflexion, sensation to light touch intact in bilateral upper and lower extremities, 2+ patellar reflexes Extremities - no pedal edema noted   Assessment/Plan: Please see individual problem list.  I have spent >25 minutes in the care of this patient with >50% spent in counseling/coordination of care regarding headache, abdominal pain, and colon cancer screening.

## 2013-11-11 NOTE — Assessment & Plan Note (Signed)
Patient is not yet at age for recommended screening. Given her request for referral, I will place this for her.

## 2013-11-12 ENCOUNTER — Ambulatory Visit: Payer: Medicaid Other | Admitting: Cardiology

## 2013-11-12 ENCOUNTER — Telehealth: Payer: Self-pay | Admitting: Oncology

## 2013-11-12 ENCOUNTER — Telehealth: Payer: Self-pay | Admitting: Family Medicine

## 2013-11-12 NOTE — Telephone Encounter (Signed)
Patient has a few questions from her OV with Dr. Birdie Sons on 12/15. Would like to speak to nurse.

## 2013-11-12 NOTE — Telephone Encounter (Signed)
Deborah [name] called requesting a refill for diazepam (VALIUM) 5 MG tablet.  She would like it called in to [facility] on [address]

## 2013-11-12 NOTE — Telephone Encounter (Signed)
Pt had questions about her labs.  Spent 10+ minutes discussing with her. Fleeger, Maryjo Rochester

## 2013-11-12 NOTE — Telephone Encounter (Signed)
[facility] on [address] to refill diazepam (VALIUM) 5 MG tablet 30 tablet Sig - Route: Take 1 tablet (5 mg total) by mouth every 8 (eight) hours as needed for anxiety. 0 refills per Dr. Roselind Messier. Called Pooja back to let her know it had been called in.

## 2013-11-13 ENCOUNTER — Telehealth: Payer: Self-pay | Admitting: Family Medicine

## 2013-11-13 DIAGNOSIS — C541 Malignant neoplasm of endometrium: Secondary | ICD-10-CM

## 2013-11-13 MED ORDER — MAGNESIUM OXIDE 400 MG PO CAPS: 400.0000 mg | ORAL_CAPSULE | Freq: Two times a day (BID) | ORAL | Status: DC

## 2013-11-13 NOTE — Telephone Encounter (Signed)
Prescription redone

## 2013-11-13 NOTE — Telephone Encounter (Signed)
Pt called because the prescription for magnesium only contained 10 pills, she needs at least 60-100 called. jw

## 2013-11-16 ENCOUNTER — Other Ambulatory Visit: Payer: Self-pay

## 2013-11-16 DIAGNOSIS — C541 Malignant neoplasm of endometrium: Secondary | ICD-10-CM

## 2013-11-16 MED ORDER — POTASSIUM CHLORIDE ER 10 MEQ PO TBCR: EXTENDED_RELEASE_TABLET | ORAL | Status: DC

## 2013-11-20 IMAGING — CR DG KNEE STANDING AP BILAT
2 series · 2 of 2 positions shown · non-contrast
Comparison: 12/22/2007

CLINICAL DATA: Pain

BILATERAL KNEES STANDING - 1 VIEW

[view not recorded (1 of 2)]
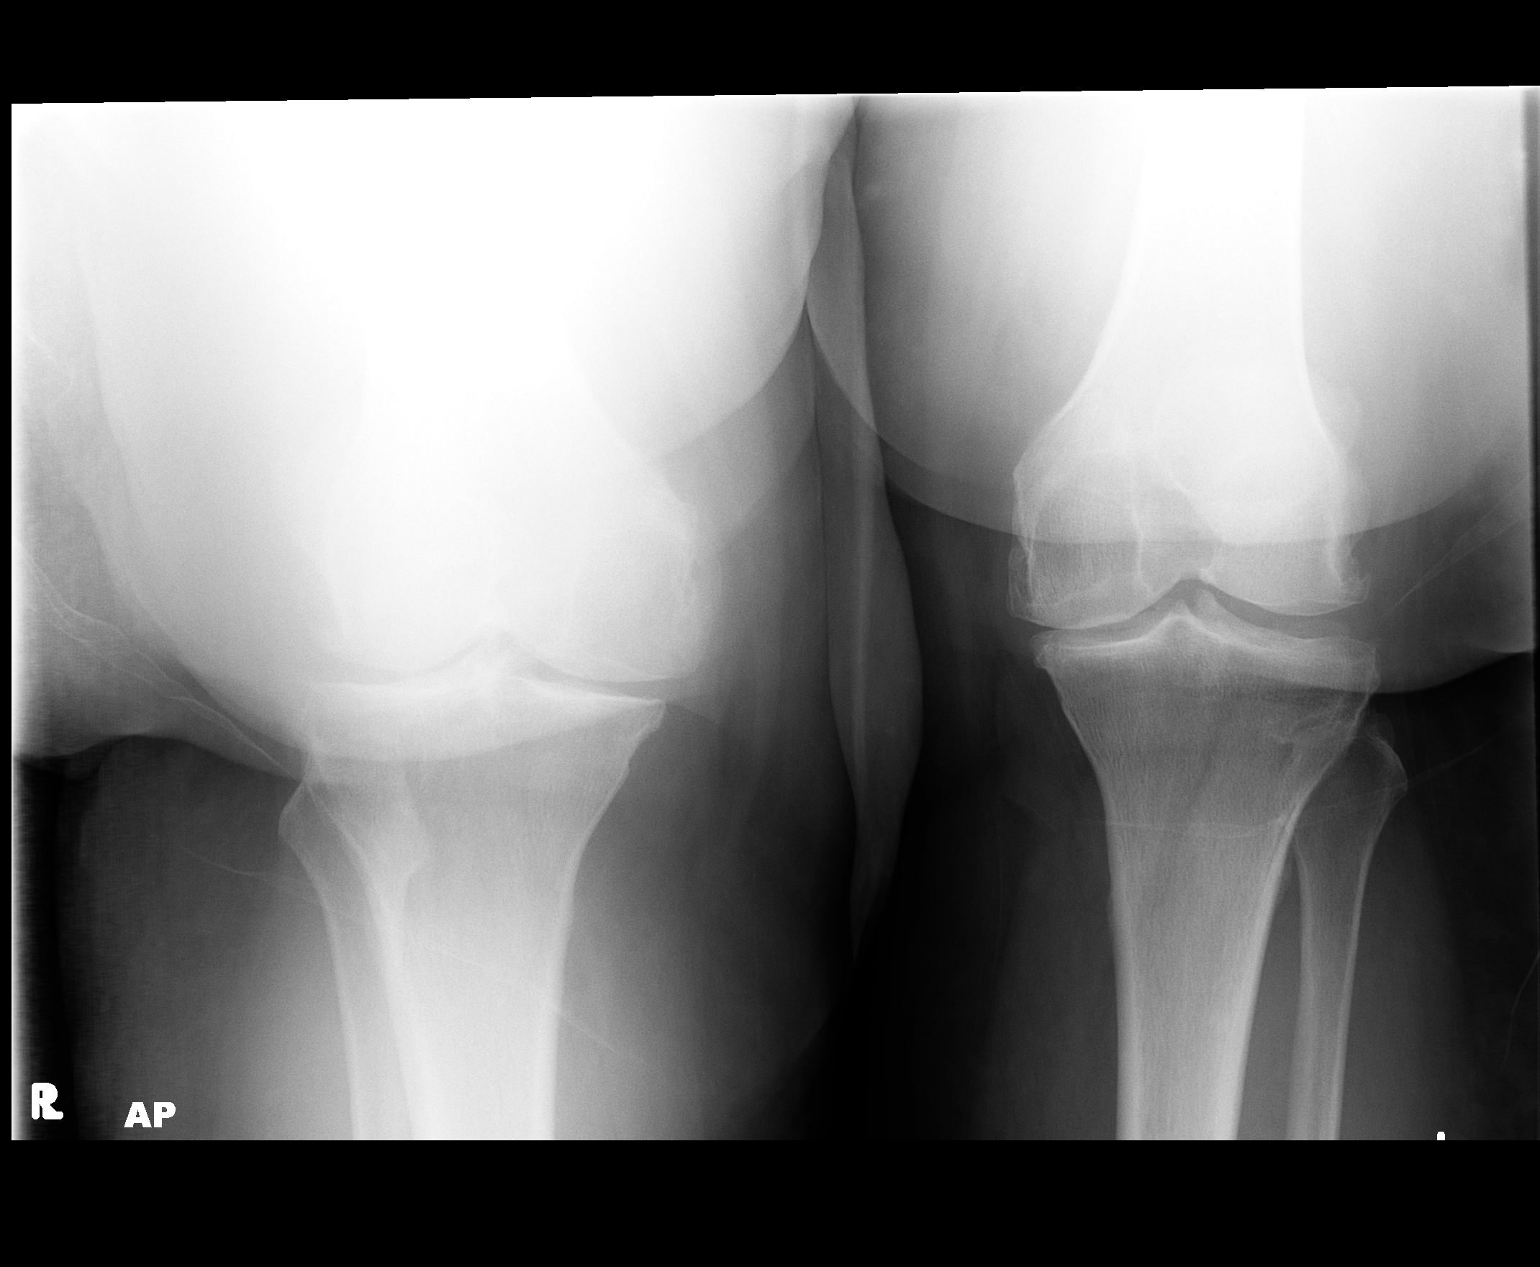

[view not recorded (2 of 2)]
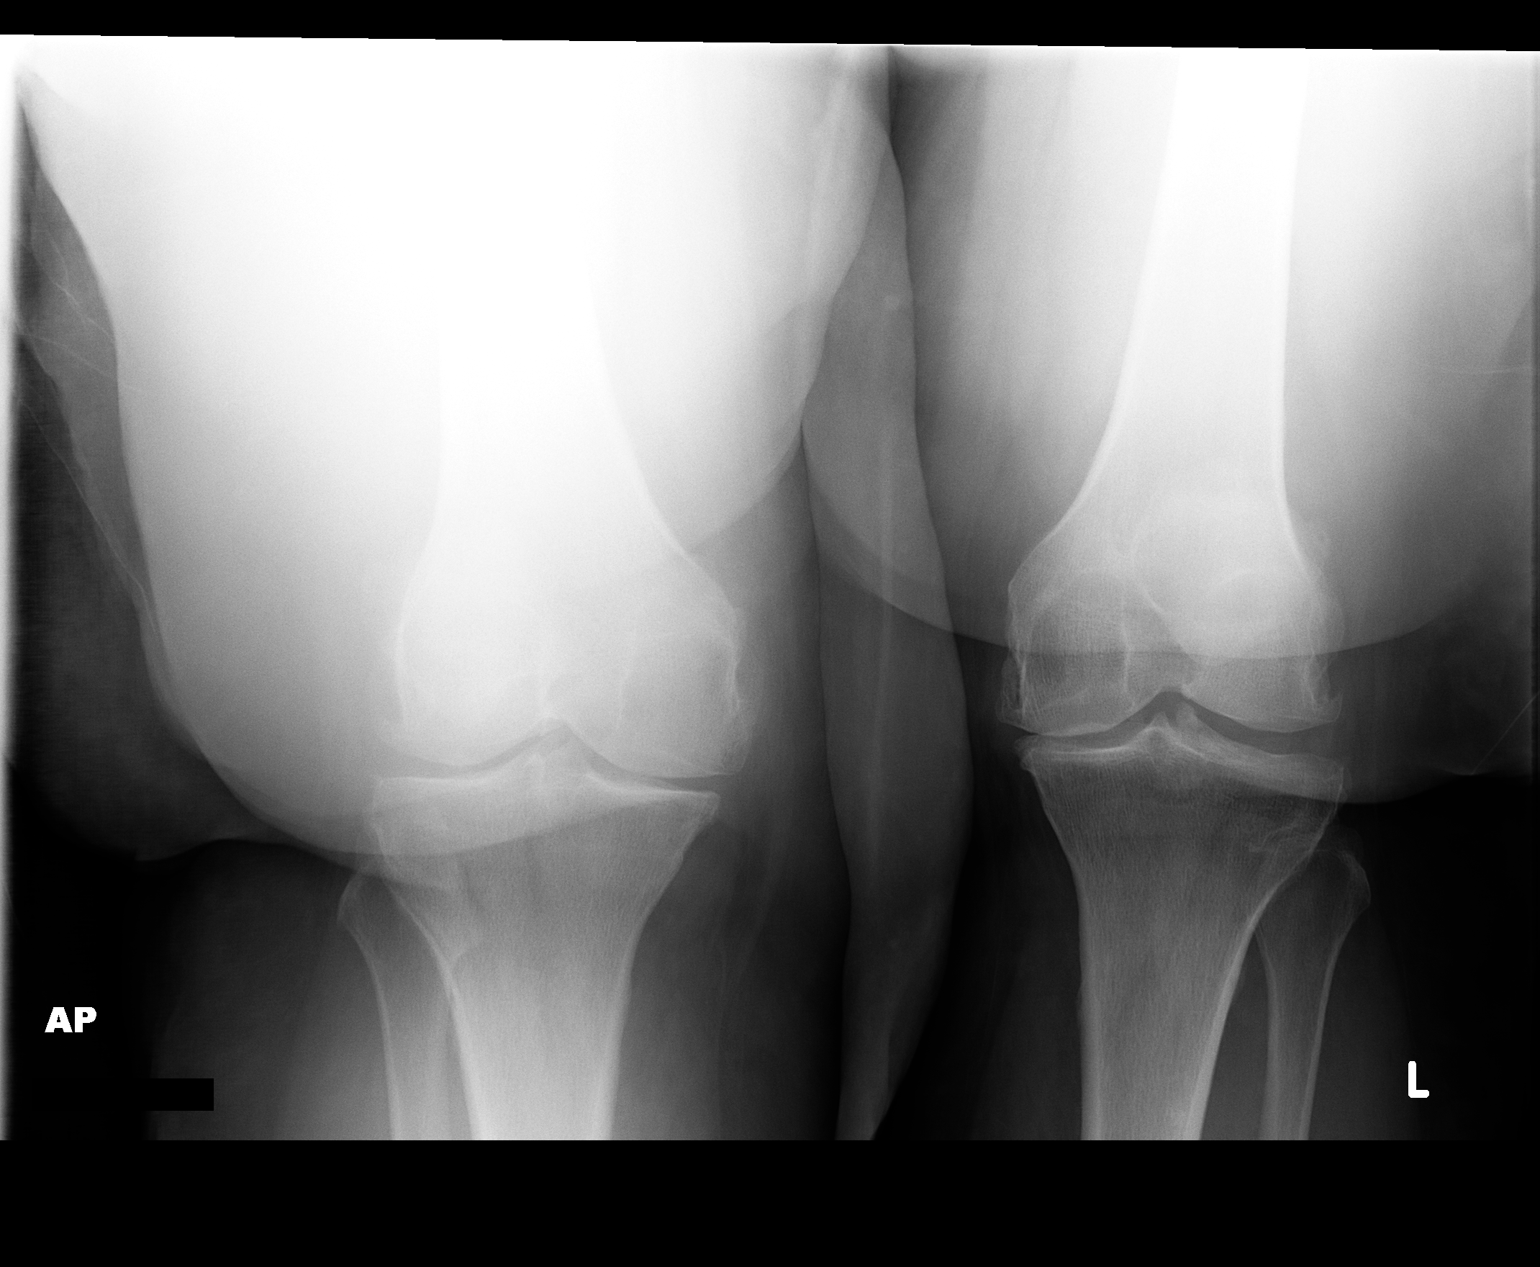

[2 of 2 positions shown; findings below may reference images not displayed]

FINDINGS: There is bilateral osteoarthritis with joint space
narrowing and marginal osteophytes.  Joint space narrowing is most
pronounced in the medial compartments.  Findings are slightly worse
on the right than the left.
IMPRESSION: Bilateral osteoarthritis.

## 2013-11-25 ENCOUNTER — Encounter: Payer: Self-pay | Admitting: *Deleted

## 2013-12-02 ENCOUNTER — Encounter: Payer: Self-pay | Admitting: Family Medicine

## 2013-12-02 ENCOUNTER — Ambulatory Visit (INDEPENDENT_AMBULATORY_CARE_PROVIDER_SITE_OTHER): Payer: Medicaid Other | Admitting: Family Medicine

## 2013-12-02 ENCOUNTER — Telehealth: Payer: Self-pay | Admitting: Family Medicine

## 2013-12-02 DIAGNOSIS — D696 Thrombocytopenia, unspecified: Secondary | ICD-10-CM

## 2013-12-02 LAB — CBC
HEMATOCRIT: 42.1 % (ref 36.0–46.0)
HEMOGLOBIN: 14.3 g/dL (ref 12.0–15.0)
MCH: 31 pg (ref 26.0–34.0)
MCHC: 34 g/dL (ref 30.0–36.0)
MCV: 91.3 fL (ref 78.0–100.0)
Platelets: 293 10*3/uL (ref 150–400)
RBC: 4.61 MIL/uL (ref 3.87–5.11)
RDW: 14.1 % (ref 11.5–15.5)
WBC: 3.5 10*3/uL — ABNORMAL LOW (ref 4.0–10.5)

## 2013-12-02 LAB — MAGNESIUM: Magnesium: 1.3 mg/dL — ABNORMAL LOW (ref 1.5–2.5)

## 2013-12-02 MED ORDER — MAGNESIUM CHLORIDE 64 MG PO TBEC: 2.0000 | DELAYED_RELEASE_TABLET | Freq: Every day | ORAL | Status: DC

## 2013-12-02 NOTE — Progress Notes (Signed)
Patient ID: Deborah [name], female   DOB: Apr 29, 1966, 48 y.o.   MRN: [id number]  Deborah Rumps, MD Phone: [phone]  Deborah [name] is a 48 y.o. female who presents today for f/u labs.  Patient seen last month with low magnesium and low platelets. She notes she has been taking her magnesium supplement, though this causes a fair amount of diarrhea and upset stomach. This has increased since we increased her dosing of mag supplement at her last visit. She wants to know if there is another form of magnesium she could take. She notes she has follow-up with Deborah [name] on Friday.  She notes no other complaints.  Past Medical History  Diagnosis Date  . Congenital birth defect     Right hand  . Anemia   . Arthritis     knees  . Endometrial cancer   . Anxiety   . Swelling     ANKLES - TAKES LASIX  . Depression   . HLD (hyperlipidemia)     borderline  . Obesity   . Hx of radiation therapy 04/07/13- 05/14/13    pelvis 45 gray 25 fx    History  Smoking status  . Never Smoker   Smokeless tobacco  . Never Used    Family History  Problem Relation Age of Onset  . Pancreatic cancer Maternal Grandfather     or liver cancer  . Hypertension Maternal Grandfather   . Cancer Maternal Grandfather     pancreatic  . Hypertension Maternal Grandmother   . Diabetes Mother   . Colon polyps Maternal Aunt   . Cancer Maternal Aunt     lung  . Colon cancer Cousin   . Cancer Cousin     lung  . Lung cancer Maternal Aunt     Current Outpatient Prescriptions on File Prior to Visit  Medication Sig Dispense Refill  . aspirin 81 MG tablet Take 81 mg by mouth every morning.       . bifidobacterium infantis (ALIGN) capsule Take 1 capsule by mouth daily as needed.      . carvedilol (COREG) 3.125 MG tablet Take 1 tablet (3.125 mg total) by mouth 2 (two) times daily.  60 tablet  11  . diazepam (VALIUM) 5 MG tablet Take 5 mg by mouth every 8 (eight) hours as needed for anxiety (30 tablets, 0  refills). 1 daily prn back pain or anxiety prior to radiation therapy      . diclofenac (VOLTAREN) 75 MG EC tablet Take 1 tablet (75 mg total) by mouth 2 (two) times daily.  30 tablet  0  . docusate sodium (COLACE) 50 MG capsule Take 1 capsule (50 mg total) by mouth 2 (two) times daily.  10 capsule  0  . emollient (BIAFINE) cream Apply 1 application topically 2 (two) times daily. Apply to affected area after rad txs and bedtime , not 4 hours prior to treatments      . ferrous sulfate 325 (65 FE) MG tablet 325 mg every evening. Takes twice a week      . guaiFENesin 200 MG tablet Take 2 tablets (400 mg total) by mouth every 4 (four) hours as needed for congestion.  30 tablet  0  . HYDROcodone-acetaminophen (NORCO/VICODIN) 5-325 MG per tablet Take 1-2 tablets every 6 hours as needed for severe pain  30 tablet  0  . lactase (LACTAID) 3000 UNITS tablet Take 1 tablet by mouth 3 (three) times daily with meals as needed.      [name]  lisinopril (PRINIVIL,ZESTRIL) 2.5 MG tablet Take 1 tablet (2.5 mg total) by mouth daily.  30 tablet  6  . loperamide (IMODIUM) 2 MG capsule Take 2 mg by mouth 4 (four) times daily as needed for diarrhea or loose stools.      [name] loratadine (CLARITIN) 10 MG tablet Take 1 tablet (10 mg total) by mouth daily.  30 tablet  0  . LORazepam (ATIVAN) 1 MG tablet Take 1 tablet (1 mg total) by mouth every 8 (eight) hours.  30 tablet  0  . mometasone (NASONEX) 50 MCG/ACT nasal spray Place 2 sprays into the nose daily. One spray in each nostril.  17 g  0  . ondansetron (ZOFRAN) 8 MG tablet Take 1-2 tablets (8-16 mg total) by mouth every 8 (eight) hours as needed for nausea (Will not make you drowsy).  30 tablet  1  . OVER THE COUNTER MEDICATION Allergy Plus Sinus Headache- acetaminophen 325, diphenhydramine 12.5, phenylephrine 5 mg      . OVER THE COUNTER MEDICATION Medi-phenyl 5 mg      . phenazopyridine (PYRIDIUM) 200 MG tablet Take 1 tablet (200 mg total) by mouth 3 (three) times daily as needed  for pain.  30 tablet  0  . polyethylene glycol powder (GLYCOLAX/MIRALAX) powder Take 17 g by mouth daily as needed.  3350 g  1  . potassium chloride (K-DUR) 10 MEQ tablet Take two tablets twice a day or as directed.  120 tablet  1  . prochlorperazine (COMPAZINE) 10 MG tablet Take 1 tablet (10 mg total) by mouth every 6 (six) hours as needed.  20 tablet  0  . sertraline (ZOLOFT) 100 MG tablet take 1 tablet by mouth once daily  30 tablet  1  . traMADol (ULTRAM) 50 MG tablet Take 1 tablet (50 mg total) by mouth every 8 (eight) hours as needed for pain.  30 tablet  2   No current facility-administered medications on file prior to visit.    ROS: Per HPI   Physical Exam Filed Vitals:   12/02/13 0950  BP: 130/86  Pulse: 74  Temp: 98.1 F (36.7 C)    Physical Examination: General appearance - alert, well appearing, and in no distress   Assessment/Plan: Please see individual problem list.

## 2013-12-02 NOTE — Telephone Encounter (Signed)
Her new prescription for magniesium will not be ready until the 13 of January. What should she do?

## 2013-12-02 NOTE — Patient Instructions (Signed)
Nice to meet you. I have changed the type of magnesium you take to magnesium chloride. Please try this and let me know if this helps limit your stomach upset. We will call you with the results of your lab tests.

## 2013-12-02 NOTE — Assessment & Plan Note (Signed)
Patient with significant diarrhea with magnesium supplement. Will change to magnesium chloride for extended release formula and hopefully to decrease GI upset. Will f/u mag level today.

## 2013-12-04 ENCOUNTER — Encounter: Payer: Self-pay | Admitting: Cardiology

## 2013-12-04 ENCOUNTER — Ambulatory Visit (INDEPENDENT_AMBULATORY_CARE_PROVIDER_SITE_OTHER): Payer: Medicaid Other | Admitting: Cardiology

## 2013-12-04 VITALS — BP 115/89 | HR 84 | Ht 66.0 in | Wt 280.0 lb

## 2013-12-04 DIAGNOSIS — R9430 Abnormal result of cardiovascular function study, unspecified: Secondary | ICD-10-CM

## 2013-12-04 MED ORDER — CARVEDILOL 3.125 MG PO TABS: ORAL_TABLET | ORAL | Status: DC

## 2013-12-04 NOTE — Telephone Encounter (Signed)
She should keep taking her current magnesium supplement until that time.

## 2013-12-04 NOTE — Telephone Encounter (Signed)
Pt informed. Fleeger, Jessica Dawn  

## 2013-12-04 NOTE — Progress Notes (Signed)
HPI The patient presents for evaluation of chest discomfort. She was actually seen here in 2000 and and did have a stress perfusion study. I reviewed this demonstrated possibly a reduced ejection fraction of 47%. There was artifact. There was perhaps some hypoperfusion of the anterior septum. Cardiac catheterization was planned but this did not happen. I saw her last year and I ordered dobutamine echocardiogram. This confirmed the ejection fraction was somewhat low at 35%. However, there was no evidence of ischemia or infarct.   Since I last saw her she was seen by Richardson Dopp PAc and we did try to add lisinopril to her regimen. However, she did not tolerate this. She feels lightheaded. She stopped taking it after talking with [place]. She denies any other acute cardiovascular symptoms though she thinks she is somewhat fatigued on her carvedilol. She has not had presyncope or syncope. She's had no chest pressure, neck or arm discomfort.  No Known Allergies  Current Outpatient Prescriptions  Medication Sig Dispense Refill  . aspirin 81 MG tablet Take 81 mg by mouth every morning.       . bifidobacterium infantis (ALIGN) capsule Take 1 capsule by mouth daily as needed.      . carvedilol (COREG) 3.125 MG tablet Take 1 tablet (3.125 mg total) by mouth 2 (two) times daily.  60 tablet  11  . diazepam (VALIUM) 5 MG tablet Take 5 mg by mouth every 8 (eight) hours as needed for anxiety (30 tablets, 0 refills). 1 daily prn back pain or anxiety prior to radiation therapy      . docusate sodium (COLACE) 50 MG capsule Take 1 capsule (50 mg total) by mouth 2 (two) times daily.  10 capsule  0  . emollient (BIAFINE) cream Apply 1 application topically 2 (two) times daily. Apply to affected area after rad txs and bedtime , not 4 hours prior to treatments      . ferrous sulfate 325 (65 FE) MG tablet 325 mg every evening. Takes twice a week      . guaiFENesin 200 MG tablet Take 2 tablets (400 mg total) by mouth every 4  (four) hours as needed for congestion.  30 tablet  0  . lactase (LACTAID) 3000 UNITS tablet Take 1 tablet by mouth 3 (three) times daily with meals as needed.      . loperamide (IMODIUM) 2 MG capsule Take 2 mg by mouth 4 (four) times daily as needed for diarrhea or loose stools.      [name] loratadine (CLARITIN) 10 MG tablet Take 1 tablet (10 mg total) by mouth daily.  30 tablet  0  . magnesium chloride (SLOW-MAG) 64 MG TBEC SR tablet Take 2 tablets (128 mg total) by mouth daily.  60 tablet  2  . mometasone (NASONEX) 50 MCG/ACT nasal spray Place 2 sprays into the nose daily. One spray in each nostril.  17 g  0  . OVER THE COUNTER MEDICATION Allergy Plus Sinus Headache- acetaminophen 325, diphenhydramine 12.5, phenylephrine 5 mg      . OVER THE COUNTER MEDICATION Medi-phenyl 5 mg      . polyethylene glycol powder (GLYCOLAX/MIRALAX) powder Take 17 g by mouth daily as needed.  3350 g  1  . potassium chloride (K-DUR) 10 MEQ tablet Take two tablets twice a day or as directed.  120 tablet  1  . sertraline (ZOLOFT) 100 MG tablet take 1 tablet by mouth once daily  30 tablet  1  . traMADol (ULTRAM) 50  MG tablet Take 1 tablet (50 mg total) by mouth every 8 (eight) hours as needed for pain.  30 tablet  2  . diclofenac (VOLTAREN) 75 MG EC tablet Take 1 tablet (75 mg total) by mouth 2 (two) times daily.  30 tablet  0  . phenazopyridine (PYRIDIUM) 200 MG tablet Take 1 tablet (200 mg total) by mouth 3 (three) times daily as needed for pain.  30 tablet  0   No current facility-administered medications for this visit.    Past Medical History  Diagnosis Date  . Congenital birth defect     Right hand  . Anemia   . Arthritis     knees  . Endometrial cancer   . Anxiety   . Swelling     ANKLES - TAKES LASIX  . Depression   . HLD (hyperlipidemia)     borderline  . Obesity   . Hx of radiation therapy 04/07/13- 05/14/13    pelvis 45 gray 25 fx    Past Surgical History  Procedure Laterality Date  . Hiatal  hernia repair    . Repair vaginal cuff  10/07/2012    Procedure: REPAIR VAGINAL CUFF;  Surgeon: [name], MD PHD;  Location: [facility];  Service: Gynecology;;  . Robotic assisted total hysterectomy with bilateral salpingo oopherectomy  10/07/2012    Procedure: ROBOTIC ASSISTED TOTAL HYSTERECTOMY WITH BILATERAL SALPINGO OOPHORECTOMY;  Surgeon: [name], MD PHD;  Location: [facility];  Service: Gynecology;  Laterality: Bilateral;  . Abdominal hysterectomy  10/2011    complete    ROS:  As stated in the history of present illness and negative for all other systems.  PHYSICAL EXAM BP 115/89  Pulse 84  Ht 5\' 6"  (1.676 m)  Wt 280 lb (127.007 kg)  BMI 45.21 kg/m2  LMP 09/20/2012 GENERAL:  Well appearing NECK:  No jugular venous distention, waveform within normal limits, carotid upstroke brisk and symmetric, no bruits, no thyromegaly LUNGS:  Clear to auscultation bilaterally BACK:  No CVA tenderness CHEST:  Unremarkable HEART:  PMI not displaced or sustained,S1 and S2 within normal limits, no S3, no S4, possible click, no rubs, no murmurs ABD:  Flat, positive bowel sounds normal in frequency in pitch, no bruits, no rebound, no guarding, no midline pulsatile mass, no hepatomegaly, no splenomegaly EXT:  2 plus pulses throughout, no edema, no cyanosis no clubbing, right hand congenital abnormality   EKG:  Sinus rhythm, rate 84, axis within normal limits, intervals within normal limits, no acute ST-T wave changes.  Abnormal R wave progression.  PACs  12/04/2013  ASSESSMENT AND PLAN  Chest pain - The patient presents with chest discomfort that is somewhat atypical. She has no evidence of ischemia on her stress test. Therefore, no further workup is suggested except as described below.  Cardiomyopathy - She did not tolerate the ACE inhibitor. She will allow me to try to increase her carvedilol. I will do this very slowly as she is sensitive to medications. I will increase to 6.25 mg in the  evening with 3.125 mg still in the morning.

## 2013-12-04 NOTE — Patient Instructions (Signed)
Please increase your carvedilol to 1 tablet in the am (3.125 mg) and 2 tablets in the pm (6.25 mg) Continue all other medications as listed.  Follow up in 2 months with Dr Percival Spanish.

## 2013-12-08 ENCOUNTER — Telehealth: Payer: Self-pay | Admitting: Oncology

## 2013-12-08 ENCOUNTER — Other Ambulatory Visit: Payer: Self-pay | Admitting: Radiation Oncology

## 2013-12-08 MED ORDER — DIAZEPAM 5 MG PO TABS: 5.0000 mg | ORAL_TABLET | Freq: Three times a day (TID) | ORAL | Status: DC | PRN

## 2013-12-08 NOTE — Telephone Encounter (Signed)
Left a message for Monay Houlton that her refill for valium is available for pick up at the Radiation Oncology nursing desk.

## 2013-12-09 ENCOUNTER — Telehealth: Payer: Self-pay | Admitting: Oncology

## 2013-12-09 NOTE — Telephone Encounter (Signed)
Called in refill to [facility] on [address] for diazepam (VALIUM) 5 MG tablet Route: Take 1 tablet (5 mg total) by mouth every 8 (eight) hours as needed for anxiety (30 tablets, 0 refills). Called Deborah [name] back to let her know.

## 2013-12-10 ENCOUNTER — Telehealth: Payer: Self-pay | Admitting: *Deleted

## 2013-12-10 ENCOUNTER — Encounter: Payer: Self-pay | Admitting: Radiation Oncology

## 2013-12-10 ENCOUNTER — Ambulatory Visit
Admission: RE | Admit: 2013-12-10 | Discharge: 2013-12-10 | Disposition: A | Discharge status: Home / Self Care | Payer: Medicaid Other | Source: Ambulatory Visit | Attending: Radiation Oncology | Admitting: Radiation Oncology

## 2013-12-10 ENCOUNTER — Other Ambulatory Visit (HOSPITAL_COMMUNITY)
Admission: RE | Admit: 2013-12-10 | Discharge: 2013-12-10 | Disposition: A | Discharge status: Home / Self Care | Payer: Medicaid Other | Source: Ambulatory Visit | Attending: Radiation Oncology | Admitting: Radiation Oncology

## 2013-12-10 VITALS — BP 112/63 | HR 85 | Temp 97.5°F | Ht 66.0 in | Wt 285.3 lb

## 2013-12-10 DIAGNOSIS — Z124 Encounter for screening for malignant neoplasm of cervix: Secondary | ICD-10-CM | POA: Insufficient documentation

## 2013-12-10 DIAGNOSIS — C541 Malignant neoplasm of endometrium: Secondary | ICD-10-CM

## 2013-12-10 NOTE — Progress Notes (Signed)
Deborah [name] here for follow up after treatment.  She denies pain.  She does have an upset stomach but thinks it is from her magnesium tablets.  She denies vaginal bleeding, dysuria and diarrhea.  She has occasional fatigue.  She is using her dilator 3 times a week.

## 2013-12-10 NOTE — Telephone Encounter (Signed)
Message copied by Valerie Roys on Thu Dec 10, 2013 12:22 PM ------      Message from: Caryl Bis, ERIC G      Created: Thu Dec 10, 2013 10:28 AM       Patients platelets are improved. She should continue her magnesium supplements. We will need to keep a close watch on this and if her low magnesium continues despite supplementation will need to consider additional work-up of this. Please inform the patient. ------

## 2013-12-10 NOTE — Progress Notes (Signed)
Radiation Oncology         [phone] ________________________________  Name: Deborah [name] MRN: [id number]  Date: 12/10/2013  DOB: Apr 07, 1966  Follow-Up Visit Note  CC: Tommi Rumps, MD  Gordy Levan, MD  Diagnosis:   Stage III-C1 grade 2 endometrial adenocarcinoma    Interval Since Last Radiation:  7  months  Narrative:  The patient returns today for routine follow-up.  She is doing well and without complaints. She continues to use her vaginal dilator as recommended 3 times a week. She denies any vaginal bleeding or pelvic pain hematuria or rectal bleeding. She's had some mild problems with abdomen cramping which she relates to her magnesium tablets.                              ALLERGIES:  has No Known Allergies.  Meds: Current Outpatient Prescriptions  Medication Sig Dispense Refill  . aspirin 81 MG tablet Take 81 mg by mouth every morning.       . bifidobacterium infantis (ALIGN) capsule Take 1 capsule by mouth daily as needed.      . carvedilol (COREG) 3.125 MG tablet Take 1 tablet in the am and 2 in the pm.      . diazepam (VALIUM) 5 MG tablet Take 1 tablet (5 mg total) by mouth every 8 (eight) hours as needed for anxiety (30 tablets, 0 refills). 1 daily prn back pain or anxiety prior to radiation therapy  30 tablet  0  . docusate sodium (COLACE) 50 MG capsule Take 1 capsule (50 mg total) by mouth 2 (two) times daily.  10 capsule  0  . ferrous sulfate 325 (65 FE) MG tablet 325 mg every evening. Takes twice a week      . lactase (LACTAID) 3000 UNITS tablet Take 1 tablet by mouth 3 (three) times daily with meals as needed.      . loperamide (IMODIUM) 2 MG capsule Take 2 mg by mouth 4 (four) times daily as needed for diarrhea or loose stools.      [name] loratadine (CLARITIN) 10 MG tablet Take 1 tablet (10 mg total) by mouth daily.  30 tablet  0  . magnesium chloride (SLOW-MAG) 64 MG TBEC SR tablet Take 2 tablets (128 mg total) by mouth daily.  60 tablet  2  . mometasone  (NASONEX) 50 MCG/ACT nasal spray Place 2 sprays into the nose daily. One spray in each nostril.  17 g  0  . OVER THE COUNTER MEDICATION Allergy Plus Sinus Headache- acetaminophen 325, diphenhydramine 12.5, phenylephrine 5 mg      . OVER THE COUNTER MEDICATION Medi-phenyl 5 mg      . polyethylene glycol powder (GLYCOLAX/MIRALAX) powder Take 17 g by mouth daily as needed.  3350 g  1  . potassium chloride (K-DUR) 10 MEQ tablet Take two tablets twice a day or as directed.  120 tablet  1  . sertraline (ZOLOFT) 100 MG tablet take 1 tablet by mouth once daily  30 tablet  1  . diclofenac (VOLTAREN) 75 MG EC tablet Take 1 tablet (75 mg total) by mouth 2 (two) times daily.  30 tablet  0  . emollient (BIAFINE) cream Apply 1 application topically 2 (two) times daily. Apply to affected area after rad txs and bedtime , not 4 hours prior to treatments      . guaiFENesin 200 MG tablet Take 2 tablets (400 mg total) by mouth  every 4 (four) hours as needed for congestion.  30 tablet  0  . phenazopyridine (PYRIDIUM) 200 MG tablet Take 1 tablet (200 mg total) by mouth 3 (three) times daily as needed for pain.  30 tablet  0  . traMADol (ULTRAM) 50 MG tablet Take 1 tablet (50 mg total) by mouth every 8 (eight) hours as needed for pain.  30 tablet  2   No current facility-administered medications for this encounter.    Physical Findings: The patient is in no acute distress. Patient is alert and oriented.  height is 5\' 6"  (1.676 m) and weight is 285 lb 4.8 oz (129.411 kg). Her temperature is 97.5 F (36.4 C). Her blood pressure is 112/63 and her pulse is 85. [name]  No palpable supraclavicular adenopathy. The lungs are clear to auscultation. The heart has regular rhythm and rate. The abdomen is soft and nontender with normal bowel sounds. There is no inguinal adenopathy appreciated. On pelvic examination the external genitalia are unremarkable. A speculum exam is performed. There are no mucosal lesions noted in the vaginal  vault. A Pap smear was obtained of the proximal vagina. On bimanual exam there are no pelvic masses appreciated although pelvic exam is compromised in light of the patient's body habitus.  Lab Findings: Lab Results  Component Value Date   WBC 3.5* 12/02/2013   HGB 14.3 12/02/2013   HCT 42.1 12/02/2013   MCV 91.3 12/02/2013   PLT 293 12/02/2013      Radiographic Findings: No results found.  Impression:  No evidence of recurrence on clinical exam today, Pap smear pending  Plan:  Routine followup in 6 months. In the interim the patient will be seen by gynecologic oncology and medical oncology.  ____________________________________ Blair Promise, MD

## 2013-12-10 NOTE — Telephone Encounter (Signed)
Pt is aware of this and would like a copy placed up front for her to pick up. Jazmin Hartsell,CMA

## 2013-12-11 ENCOUNTER — Telehealth: Payer: Self-pay | Admitting: Cardiology

## 2013-12-11 MED ORDER — CARVEDILOL 3.125 MG PO TABS: ORAL_TABLET | ORAL | Status: DC

## 2013-12-11 NOTE — Telephone Encounter (Signed)
New message   Patient calling stating she wanted to speak with nurse. Did not disclose any information. To have the nurse to call her back.

## 2013-12-11 NOTE — Telephone Encounter (Signed)
Left message for pt to call back  °

## 2013-12-11 NOTE — Telephone Encounter (Signed)
New message    Returned the nurses call.

## 2013-12-14 ENCOUNTER — Telehealth: Payer: Self-pay | Admitting: Cardiology

## 2013-12-14 NOTE — Telephone Encounter (Signed)
New Prob    Pt has some concerns regarding her medication, requesting to speak to nurse. Pt did not want to leave details.

## 2013-12-14 NOTE — Telephone Encounter (Signed)
Left message for pt to call pt to discuss medications.

## 2013-12-15 ENCOUNTER — Telehealth: Payer: Self-pay | Admitting: Family Medicine

## 2013-12-15 ENCOUNTER — Telehealth: Payer: Self-pay | Admitting: Oncology

## 2013-12-15 ENCOUNTER — Telehealth: Payer: Self-pay | Admitting: Cardiology

## 2013-12-15 NOTE — Telephone Encounter (Signed)
Confirmed with patient that Dr Percival Spanish wanted her to take coreg 3.125mg  AM and 2 of a 3.125mg (6.25mg ) in the PM.

## 2013-12-15 NOTE — Telephone Encounter (Signed)
New message    Talk to a nurse.  Pt would not tell me what the message was.

## 2013-12-15 NOTE — Telephone Encounter (Signed)
Pt called to see if she was missing any lab results that she picked up yesterday.  I informed her of what she had drawn at her last visit and verified that it was what she picked up.  She did have all her lab results.  Jazmin Hartsell,CMA

## 2013-12-15 NOTE — Telephone Encounter (Signed)
Would like for Jasmin to call her back

## 2013-12-15 NOTE — Telephone Encounter (Signed)
Called Deborah [name] and let her know that her pap smear results were good per Dr. Sondra Come.

## 2013-12-15 NOTE — Telephone Encounter (Signed)
Left pt a message to call back. 

## 2013-12-16 NOTE — Telephone Encounter (Signed)
Please see phone note from 11/2013

## 2013-12-23 ENCOUNTER — Telehealth: Payer: Self-pay | Admitting: Family Medicine

## 2013-12-23 NOTE — Telephone Encounter (Signed)
Patient would like to speak to nurse/MD about her visit to [facility] and her dx for Endometrial Cancer. Please call patient.

## 2013-12-24 NOTE — Telephone Encounter (Signed)
Attempted to call the patient back this morning and there was no answer. I left a voicemail for the patient to call back to the office.

## 2013-12-30 ENCOUNTER — Other Ambulatory Visit: Payer: Medicaid Other | Admitting: Lab

## 2013-12-31 ENCOUNTER — Telehealth: Payer: Self-pay | Admitting: Oncology

## 2013-12-31 NOTE — Telephone Encounter (Signed)
Deborah [name] called and requested a refill on diazepam (VALIUM) 5 MG tablet.  She said she has one tablet left.  She last had it filled on 12/08/13.

## 2013-12-31 NOTE — Telephone Encounter (Signed)
Called in refill to [facility] on [address] per Dr. Sondra Come for diazepam (VALIUM) 5 MG tablet 30 tablet 0 12/08/2013 Sig - Route: Take 1 tablet (5 mg total) by mouth every 8 (eight) hours as needed for anxiety (30 tablets, 0 refills).  Called [facility] to let her know that it had been called in.

## 2014-01-03 ENCOUNTER — Other Ambulatory Visit: Payer: Self-pay | Admitting: Oncology

## 2014-01-09 ENCOUNTER — Other Ambulatory Visit: Payer: Self-pay | Admitting: Oncology

## 2014-01-13 ENCOUNTER — Telehealth: Payer: Self-pay | Admitting: Oncology

## 2014-01-13 NOTE — Telephone Encounter (Signed)
, °

## 2014-01-18 ENCOUNTER — Ambulatory Visit: Payer: Medicaid Other | Admitting: Oncology

## 2014-01-18 ENCOUNTER — Other Ambulatory Visit: Payer: Medicaid Other

## 2014-01-19 ENCOUNTER — Other Ambulatory Visit: Payer: Self-pay | Admitting: *Deleted

## 2014-01-19 DIAGNOSIS — C541 Malignant neoplasm of endometrium: Secondary | ICD-10-CM

## 2014-01-19 MED ORDER — POTASSIUM CHLORIDE ER 10 MEQ PO TBCR: EXTENDED_RELEASE_TABLET | ORAL | Status: DC

## 2014-01-20 ENCOUNTER — Other Ambulatory Visit: Payer: Medicaid Other

## 2014-01-22 ENCOUNTER — Other Ambulatory Visit: Payer: Self-pay | Admitting: Radiation Oncology

## 2014-01-22 ENCOUNTER — Other Ambulatory Visit: Payer: Self-pay | Admitting: Family Medicine

## 2014-01-22 ENCOUNTER — Telehealth: Payer: Self-pay | Admitting: Oncology

## 2014-01-22 DIAGNOSIS — C541 Malignant neoplasm of endometrium: Secondary | ICD-10-CM

## 2014-01-22 DIAGNOSIS — D249 Benign neoplasm of unspecified breast: Secondary | ICD-10-CM

## 2014-01-22 MED ORDER — DIAZEPAM 5 MG PO TABS: 5.0000 mg | ORAL_TABLET | Freq: Three times a day (TID) | ORAL | Status: DC | PRN

## 2014-01-22 NOTE — Telephone Encounter (Signed)
Ashiya called asking for a refill on diazepam.  Prescription refilled by Dr. Pablo Ledger.  Called in to [facility] on [address] for diazepam (VALIUM) 5 MG tablet Sig - Route: Take 1 tablet (5 mg total) by mouth every 8 (eight) hours as needed for anxiety (30 tablets, 0 refills).   Called Joi back to let her know that it has been called in.

## 2014-01-29 ENCOUNTER — Encounter: Payer: Self-pay | Admitting: Cardiology

## 2014-01-29 ENCOUNTER — Ambulatory Visit (INDEPENDENT_AMBULATORY_CARE_PROVIDER_SITE_OTHER): Payer: Medicaid Other | Admitting: Cardiology

## 2014-01-29 VITALS — BP 120/70 | HR 66 | Ht 66.0 in | Wt 292.0 lb

## 2014-01-29 DIAGNOSIS — R0789 Other chest pain: Secondary | ICD-10-CM

## 2014-01-29 NOTE — Patient Instructions (Signed)
The current medical regimen is effective;  continue present plan and medications.  Follow up in 6 months with Richardson Dopp, PA.  You will receive a letter in the mail 2 months before you are due.  Please call us when you receive this letter to schedule your follow up appointment.

## 2014-01-29 NOTE — Progress Notes (Signed)
HPI The patient presents for evaluation of chest discomfort. She was actually seen here in 2000 and and did have a stress perfusion study. I reviewed this demonstrated possibly a reduced ejection fraction of 47%. There was artifact. There was perhaps some hypoperfusion of the anterior septum. Cardiac catheterization was planned but this did not happen. I saw her last year and I ordered dobutamine echocardiogram. This confirmed the ejection fraction was somewhat low at 35%. However, there was no evidence of ischemia or infarct. She was seen by Richardson Dopp PAc and we did try to add lisinopril to her regimen. However, she did not tolerate this. At the last visit I increased the Coreg.  She has felt light headed with this but she is taking it and she is not having no acute presyncope or syncope.  She denies any new chest pain.   No Known Allergies  Current Outpatient Prescriptions  Medication Sig Dispense Refill  . aspirin 81 MG tablet Take 81 mg by mouth every morning.       . bifidobacterium infantis (ALIGN) capsule Take 1 capsule by mouth daily as needed.      . carvedilol (COREG) 3.125 MG tablet Take 1 tablet in the am and 2 in the pm.  90 tablet  6  . diazepam (VALIUM) 5 MG tablet Take 1 tablet (5 mg total) by mouth every 8 (eight) hours as needed for anxiety (30 tablets, 0 refills).  30 tablet  0  . docusate sodium (COLACE) 50 MG capsule Take 1 capsule (50 mg total) by mouth 2 (two) times daily.  10 capsule  0  . guaiFENesin 200 MG tablet Take 2 tablets (400 mg total) by mouth every 4 (four) hours as needed for congestion.  30 tablet  0  . lactase (LACTAID) 3000 UNITS tablet Take 1 tablet by mouth 3 (three) times daily with meals as needed.      . loperamide (IMODIUM) 2 MG capsule Take 2 mg by mouth 4 (four) times daily as needed for diarrhea or loose stools.      [name] loratadine (CLARITIN) 10 MG tablet Take 1 tablet (10 mg total) by mouth daily.  30 tablet  0  . magnesium chloride (SLOW-MAG) 64  MG TBEC SR tablet Take 2 tablets (128 mg total) by mouth daily.  60 tablet  2  . mometasone (NASONEX) 50 MCG/ACT nasal spray Place 2 sprays into the nose daily. One spray in each nostril.  17 g  0  . OVER THE COUNTER MEDICATION Allergy Plus Sinus Headache- acetaminophen 325, diphenhydramine 12.5, phenylephrine 5 mg      . OVER THE COUNTER MEDICATION Medi-phenyl 5 mg      . polyethylene glycol powder (GLYCOLAX/MIRALAX) powder Take 17 g by mouth daily as needed.  3350 g  1  . potassium chloride (K-DUR) 10 MEQ tablet Take two tablets twice a day or as directed.  120 tablet  0  . sertraline (ZOLOFT) 100 MG tablet take 1 tablet by mouth once daily  30 tablet  1  . ferrous sulfate 325 (65 FE) MG tablet 325 mg every evening. Takes twice a week       No current facility-administered medications for this visit.    Past Medical History  Diagnosis Date  . Congenital birth defect     Right hand  . Anemia   . Arthritis     knees  . Endometrial cancer   . Anxiety   . Swelling     ANKLES -  TAKES LASIX  . Depression   . HLD (hyperlipidemia)     borderline  . Obesity   . Hx of radiation therapy 04/07/13- 05/14/13    pelvis 45 gray 25 fx    Past Surgical History  Procedure Laterality Date  . Hiatal hernia repair    . Repair vaginal cuff  10/07/2012    Procedure: REPAIR VAGINAL CUFF;  Surgeon: [name], MD PHD;  Location: [facility];  Service: Gynecology;;  . Robotic assisted total hysterectomy with bilateral salpingo oopherectomy  10/07/2012    Procedure: ROBOTIC ASSISTED TOTAL HYSTERECTOMY WITH BILATERAL SALPINGO OOPHORECTOMY;  Surgeon: [name], MD PHD;  Location: [facility];  Service: Gynecology;  Laterality: Bilateral;  . Abdominal hysterectomy  10/2011    complete    ROS:  As stated in the history of present illness and negative for all other systems.  PHYSICAL EXAM BP 120/70  Pulse 66  Ht 5\' 6"  (1.676 m)  Wt 292 lb (132.45 kg)  BMI 47.15 kg/m2  LMP 09/20/2012 GENERAL:  Well  appearing NECK:  No jugular venous distention, waveform within normal limits, carotid upstroke brisk and symmetric, no bruits, no thyromegaly LUNGS:  Clear to auscultation bilaterally HEART:  PMI not displaced or sustained,S1 and S2 within normal limits, no S3, no S4, possible click, no rubs, no murmurs ABD:  Flat, positive bowel sounds normal in frequency in pitch, no bruits, no rebound, no guarding, no midline pulsatile mass, no hepatomegaly, no splenomegaly EXT:  2 plus pulses throughout, no edema, no cyanosis no clubbing, right hand congenital abnormality   ASSESSMENT AND PLAN  Chest pain - The patient presents with chest discomfort that is somewhat atypical. She has no evidence of ischemia on her stress test. Therefore, no further workup is suggested.  Cardiomyopathy - She did not tolerate the ACE inhibitor. She seems to be tolerating the low-dose beta blocker but barely. She will remain on the meds as listed.

## 2014-02-07 ENCOUNTER — Other Ambulatory Visit: Payer: Self-pay | Admitting: Oncology

## 2014-02-11 ENCOUNTER — Ambulatory Visit (HOSPITAL_BASED_OUTPATIENT_CLINIC_OR_DEPARTMENT_OTHER): Payer: Medicaid Other | Admitting: Oncology

## 2014-02-11 ENCOUNTER — Encounter: Payer: Self-pay | Admitting: Oncology

## 2014-02-11 ENCOUNTER — Ambulatory Visit: Payer: Medicaid Other | Admitting: Cardiology

## 2014-02-11 ENCOUNTER — Other Ambulatory Visit (HOSPITAL_BASED_OUTPATIENT_CLINIC_OR_DEPARTMENT_OTHER): Payer: Medicaid Other

## 2014-02-11 VITALS — BP 132/88 | HR 91 | Temp 98.1°F | Resp 20 | Ht 66.0 in | Wt 292.4 lb

## 2014-02-11 DIAGNOSIS — C541 Malignant neoplasm of endometrium: Secondary | ICD-10-CM

## 2014-02-11 DIAGNOSIS — M545 Low back pain, unspecified: Secondary | ICD-10-CM

## 2014-02-11 DIAGNOSIS — G8929 Other chronic pain: Secondary | ICD-10-CM

## 2014-02-11 DIAGNOSIS — C549 Malignant neoplasm of corpus uteri, unspecified: Secondary | ICD-10-CM

## 2014-02-11 LAB — CBC WITH DIFFERENTIAL/PLATELET
BASO%: 0.4 % (ref 0.0–2.0)
Basophils Absolute: 0 10*3/uL (ref 0.0–0.1)
EOS ABS: 0.1 10*3/uL (ref 0.0–0.5)
EOS%: 2.1 % (ref 0.0–7.0)
HCT: 39.9 % (ref 34.8–46.6)
HGB: 13.1 g/dL (ref 11.6–15.9)
LYMPH%: 19.2 % (ref 14.0–49.7)
MCH: 30.2 pg (ref 25.1–34.0)
MCHC: 32.8 g/dL (ref 31.5–36.0)
MCV: 91.9 fL (ref 79.5–101.0)
MONO#: 0.3 10*3/uL (ref 0.1–0.9)
MONO%: 8.2 % (ref 0.0–14.0)
NEUT#: 3 10*3/uL (ref 1.5–6.5)
NEUT%: 70.1 % (ref 38.4–76.8)
Platelets: 274 10*3/uL (ref 145–400)
RBC: 4.34 10*6/uL (ref 3.70–5.45)
RDW: 15.5 % — ABNORMAL HIGH (ref 11.2–14.5)
WBC: 4.2 10*3/uL (ref 3.9–10.3)
lymph#: 0.8 10*3/uL — ABNORMAL LOW (ref 0.9–3.3)

## 2014-02-11 LAB — COMPREHENSIVE METABOLIC PANEL (CC13)
ALK PHOS: 70 U/L (ref 40–150)
ALT: 14 U/L (ref 0–55)
AST: 12 U/L (ref 5–34)
Albumin: 3.6 g/dL (ref 3.5–5.0)
Anion Gap: 15 mEq/L — ABNORMAL HIGH (ref 3–11)
BILIRUBIN TOTAL: 0.26 mg/dL (ref 0.20–1.20)
BUN: 15.5 mg/dL (ref 7.0–26.0)
CO2: 25 meq/L (ref 22–29)
CREATININE: 0.8 mg/dL (ref 0.6–1.1)
Calcium: 9.7 mg/dL (ref 8.4–10.4)
Chloride: 102 mEq/L (ref 98–109)
GLUCOSE: 108 mg/dL (ref 70–140)
Potassium: 3.6 mEq/L (ref 3.5–5.1)
SODIUM: 142 meq/L (ref 136–145)
Total Protein: 8.1 g/dL (ref 6.4–8.3)

## 2014-02-11 LAB — MAGNESIUM (CC13): Magnesium: 1.3 mg/dl — CL (ref 1.5–2.5)

## 2014-02-11 NOTE — Progress Notes (Signed)
OFFICE PROGRESS NOTE   02/11/2014   Physicians:W.Brewster, J.Kinard, M.Georgeanna Lea (PCP [facility]) J.Hochrein   INTERVAL HISTORY:  Patient is seen, alone for visit, in follow up of IIIC endometrial carcinoma, on observation since completing adjuvant chemotherapy and radiation given in sandwich fashion from 01-07-13 thru 06-2013. Last imaging was CT AP 08-25-13; she is to see Dr Skeet Latch next month and Dr Sondra Come in July. She has not had bleeding or abdominal or pelvic pain. She had recent evaluation for atypical chest pain with negative stress test. She has gained ~ 20 lbs since Oct 2014, which we have discussed.  She does not have PAC.  PC[facility] at [facility] is now Dr Tommi Rumps   ONCOLOGIC HISTORY Oncology History   IIIC grade 2 endometroid adenocarcinoma at robotic hysterectomy with BSO 10-07-12 (no LN sampling as serosal cysts appeared malignant, however were benign on final path).     Endometrial cancer   10/08/2012 Initial Diagnosis Endometrial cancer  Patient presented with spotting and menorrhagia, last PAP 2 years prior. Biopsies by Dr Clovia Cuff 09-22-2012 (path [id number]) showed endometroid adenocarcinoma. She had robotic hysterectomy with BSO by Dr Skeet Latch on 10-07-12 (no lymph nodes sampled as serosal cysts appeared malignant). Pathology [phone]) showed grade 2 endometroid adenocarcinoma with involvement of < 1/2 of myometrium, no LVSI, involvement of lower uterine segment, ovaries and tubes benign and the serosal cysts benign. CT AP 11-25-12 showed shotty bilateral external iliac nodes up to 1.3 cm and nonspecific < 5 mm left para-aortic nodes. She saw Dr Skeet Latch following the CT, with recommendation for chemotherapy and radiation in sandwich fashion. Taxol/carboplatin was begun 01-07-13, with 3 cycles thru 02-18-13 with gCSF support, then IMRT 45 cGy to pelvis by Dr Sondra Come from 5-13 thru 05-14-13. She missed appointments, then resumed chemotherapy with cycle  4  06-11-13 and completed cycle 6 on 07-24-13, with neulasta.   Review of systems as above, also: No recent infectious illness. No cough or increased SOB. Pain in knees with ambulation. Bowels moving regularly. She has not followed any diet since appetite improved after chemo, discussed. Patient reports negative hemoccult cards recently (?) Remainder of 10 point Review of Systems negative.  Objective:  Vital signs in last 24 hours:  BP 132/88  Pulse 91  Temp(Src) 98.1 F (36.7 C) (Oral)  Resp 20  Ht 5\' 6"  (1.676 m)  Wt 292 lb 6.4 oz (132.632 kg)  BMI 47.22 kg/m2  LMP 09/20/2012 Weight is up 21 lbs, from 271 in 08-2013.  Alert, oriented and appropriate. Ambulatory without assistance.  No alopecia  HEENT:PERRL, sclerae not icteric. Oral mucosa moist without lesions, posterior pharynx clear.  Neck supple. No JVD.  Lymphatics:no cervical,suraclavicular, axillary or appreciable inguinal adenopathy Resp: clear to auscultation bilaterally and normal percussion bilaterally Cardio: regular rate and rhythm. No gallop. GI: obese, soft, nontender, not obviously distended, no appreciable mass or organomegaly. Normally active bowel sounds. Musculoskeletal/ Extremities: without pitting edema, cords, tenderness Neuro: no peripheral neuropathy. Otherwise nonfocal Skin without rash, ecchymosis, petechiae Breasts: without dominant mass, skin or nipple findings. Axillae benign.   Lab Results:  Results for orders placed in visit on 02/11/14  CBC WITH DIFFERENTIAL      Result Value Ref Range   WBC 4.2  3.9 - 10.3 10e3/uL   NEUT# 3.0  1.5 - 6.5 10e3/uL   HGB 13.1  11.6 - 15.9 g/dL   HCT 39.9  34.8 - 46.6 %   Platelets 274  145 - 400 10e3/uL  MCV 91.9  79.5 - 101.0 fL   MCH 30.2  25.1 - 34.0 pg   MCHC 32.8  31.5 - 36.0 g/dL   RBC 4.34  3.70 - 5.45 10e6/uL   RDW 15.5 (*) 11.2 - 14.5 %   lymph# 0.8 (*) 0.9 - 3.3 10e3/uL   MONO# 0.3  0.1 - 0.9 10e3/uL   Eosinophils Absolute 0.1  0.0 - 0.5  10e3/uL   Basophils Absolute 0.0  0.0 - 0.1 10e3/uL   NEUT% 70.1  38.4 - 76.8 %   LYMPH% 19.2  14.0 - 49.7 %   MONO% 8.2  0.0 - 14.0 %   EOS% 2.1  0.0 - 7.0 %   BASO% 0.4  0.0 - 2.0 %  COMPREHENSIVE METABOLIC PANEL (JO84)      Result Value Ref Range   Sodium 142  136 - 145 mEq/L   Potassium 3.6  3.5 - 5.1 mEq/L   Chloride 102  98 - 109 mEq/L   CO2 25  22 - 29 mEq/L   Glucose 108  70 - 140 mg/dl   BUN 15.5  7.0 - 26.0 mg/dL   Creatinine 0.8  0.6 - 1.1 mg/dL   Total Bilirubin 0.26  0.20 - 1.20 mg/dL   Alkaline Phosphatase 70  40 - 150 U/L   AST 12  5 - 34 U/L   ALT 14  0 - 55 U/L   Total Protein 8.1  6.4 - 8.3 g/dL   Albumin 3.6  3.5 - 5.0 g/dL   Calcium 9.7  8.4 - 10.4 mg/dL   Anion Gap 15 (*) 3 - 11 mEq/L  MAGNESIUM (CC13)      Result Value Ref Range   Magnesium 1.3 (*) 1.5 - 2.5 mg/dl   Chemistries resulted after visit. She will be instructed to continue potassium and magnesium, to be followed by PCP.  Studies/Results:  No results found. [place] 08-20-13, 6 mo f/u US for apparent bilateral fibroadenomas upcoming.  Medications: I have reviewed the patient's current medications. Continue K and magnesium, recently changed to Slo-mag by PCP, which she is tolerating better than priors.   Assessment/Plan:  1.IIIC endometrial carcinoma: history as above. Again recommended weight loss, discussed increasing exercise by 5000 steps daily and have requested [place] [facility] let her know if any Y programs available.  I will see her back in ~ 6 months with labs.If she is stable then, I may change my follow up to prn as other MDs following closely. 2. Degenerative arthritis with chronic low back pain and knee pain in setting of morbid obesity: as above. 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  4.morbid obesity: ongoing education for weight loss. I have told her that she needs to weigh < 200 lbs. As initial goal, tho this would still not  be at ideal weight. Unfortunately we do not have any specific programs thru [place] to assist more with this need (GOG study of diet and exercise does not include endometrial diagnosis)  5. Hypomagnesemia: tolerating present magnesium supplement better, K+ ok now. 6. Follow up US bilateral breasts scheduled 3-26- 2015    Patient has had questions answered and is in agreement with plan above.   Gordy Levan, MD   02/11/2014, 8:41 PM

## 2014-02-12 ENCOUNTER — Telehealth: Payer: Self-pay

## 2014-02-12 NOTE — Telephone Encounter (Signed)
Message copied by Baruch Merl on Fri Feb 12, 2014  6:36 PM ------      Message from: Gordy Levan      Created: Fri Feb 12, 2014  8:26 AM       Labs seen and need follow JY:NWGNFA let her know potassium was low normal and magnesium still low, at 1.3.  Until she sees PCP next, she should continue potassium and continue the Slo-mag, which hopefully she can take regularly and tolerate now. ------

## 2014-02-12 NOTE — Telephone Encounter (Signed)
Spoke with Deborah [name] and reviewed with her the information noted below by Dr. Marko Plume regarding her potassium and magnesium levels.  Deborah [name] verbalized understanding.

## 2014-02-17 ENCOUNTER — Encounter: Payer: Self-pay | Admitting: *Deleted

## 2014-02-17 NOTE — Progress Notes (Signed)
[place] Work  Clinical Social Work was referred by Futures trader for assessment of psychosocial needs.  Clinical Social Worker attempted to contact patient to provide information on [employer] program.  CSW contacted patient yesterday and patient requested phone call today.  CSW contacted patient today and left voicemail to return call when convenient. CSW will await patient call back.    Polo Riley, MSW, LCSW, OSW-C Clinical Social Worker [facility] [phone]

## 2014-02-18 ENCOUNTER — Other Ambulatory Visit: Payer: Medicaid Other

## 2014-02-18 ENCOUNTER — Ambulatory Visit
Admission: RE | Admit: 2014-02-18 | Discharge: 2014-02-18 | Disposition: A | Discharge status: Home / Self Care | Payer: Medicaid Other | Source: Ambulatory Visit | Attending: Family Medicine | Admitting: Family Medicine

## 2014-02-18 ENCOUNTER — Telehealth: Payer: Self-pay | Admitting: Family Medicine

## 2014-02-18 DIAGNOSIS — D249 Benign neoplasm of unspecified breast: Secondary | ICD-10-CM

## 2014-02-18 DIAGNOSIS — C541 Malignant neoplasm of endometrium: Secondary | ICD-10-CM

## 2014-02-18 MED ORDER — POTASSIUM CHLORIDE ER 10 MEQ PO TBCR: EXTENDED_RELEASE_TABLET | ORAL | Status: DC

## 2014-02-18 NOTE — Telephone Encounter (Signed)
Needs refill on potassium chloride [facility] on [address]

## 2014-02-18 NOTE — Telephone Encounter (Signed)
Refill sent to the pharmacy 

## 2014-02-19 ENCOUNTER — Telehealth: Payer: Self-pay | Admitting: Oncology

## 2014-02-19 NOTE — Telephone Encounter (Signed)
Deborah [name] called and requested a refill on her diazepam.  Advised her that Dr. Sondra Come is out of the office today and will be back in on Monday.  Advised her that I would call her back on Monday.  Deborah [name] verbalized agreement.

## 2014-02-22 ENCOUNTER — Telehealth: Payer: Self-pay | Admitting: Oncology

## 2014-02-22 ENCOUNTER — Other Ambulatory Visit: Payer: Medicaid Other

## 2014-02-22 ENCOUNTER — Ambulatory Visit: Payer: Medicaid Other | Admitting: Oncology

## 2014-02-22 NOTE — Telephone Encounter (Signed)
Deborah [name] called and asked for a refill on diazepam.  Called in to [facility] per Dr. Sondra Come: diazepam (VALIUM) 5 MG tablet 02/22/2014 Sig - Route: Take 5 mg by mouth every 8 (eight) hours as needed for anxiety (30 tablets, 0 refills).  Called Deborah [name] to let her know.

## 2014-02-24 ENCOUNTER — Telehealth: Payer: Self-pay | Admitting: Gynecologic Oncology

## 2014-02-24 NOTE — Telephone Encounter (Signed)
Office Visit:  GYN ONCOLOGY   CC: Endometrial cancer surveillance  HPI:48 y/o G2 P1 LNMP  06/2012.  Patient reports intermittent spotting between menses for over 1 year.  Occasional menorrhagia.  Last pap more than two years prior.  20 pound weight loss over the past year.  Deborah [name] was referred to Dr. Hulan Fray who collected an endometrial biopsy and a endocervical biopsy. Both are positive for endometrial adenocarcinoma.  On 10/07/2012 she underwent robotic hysterectomy bilateral salpingo-oophorectomy. At the time of surgery multiple cystic lesions were noted throughout the entire pelvis that was suspicious for metastatic disease, as such lymph node dissection was not collected but these lesions were biopsied. Final pathology was notable for  1. Soft tissue, biopsy, right para-colic gutter - BENIGN PARA-COLIC GUTTER SOFT TISSUE WITH BENIGN CYST (4.0 CM). SEE COMMENT. - NEGATIVE FOR MALIGNANCY. 2. Uterus +/- tubes/ovaries, neoplastic - ENDOMETRIAL ADENOCARCINOMA, ENDOMETRIOID-TYPE, SEE COMMENT. - TUMOR INVADES LESS THAN 1/2 OF MYOMETRIAL THICKNESS.- NO LYMPHOVASCULAR INVASION IDENTIFIED. - TUMOR INVADES INTO LOWER UTERINE SEGMENT. - UTERINE ADENOMYOSIS. - BENIGN RIGHT AND LEFT OVARIES; NO ATYPIA OR MALIGNANCY PRESENT. - BENIGN RIGHT AND LEFT FALLOPIAN TUBES WITH BENIGN SEROUS-TYPE PARATUBAL CYSTS. - BENIGN CERVIX; NEGATIVE FOR INTRAEPITHELIAL LESION OR MALIGNANCY. - BENIGN MULTILOCULAR UTERINE SEROSAL CYST.  CT abdomen and pelvis 11/25/2012  IMPRESSION:  1. Shotty bilateral external iliac lymphadenopathy measuring up to 1.3 cm, suspicious for metastatic disease. Nonspecific less than 5 mm retroperitoneal lymph nodes also noted in the left para-aortic region. 2. 2.7 cm postop lymphocele versus low attenuation lymphadenopathy in the proximal left external iliac chain. 3. Cholelithiasis and right nephrolithiasis incidentally noted.   End of treatment CT 08/25/2013 notable for no evidence of abdominal  lymphadenopathy. There Is no evidence of inflammatory process abscess or ascites the adnexal regions are unremarkable in appearance there is no new or progressive disease within the abdomen or pelvis   Past Surgical Hx:  Past Surgical History  Procedure Laterality Date  . Hiatal hernia repair    . Repair vaginal cuff  10/07/2012    Procedure: REPAIR VAGINAL CUFF;  Surgeon: [name], MD PHD;  Location: [facility];  Service: Gynecology;;  . Robotic assisted total hysterectomy with bilateral salpingo oopherectomy  10/07/2012    Procedure: ROBOTIC ASSISTED TOTAL HYSTERECTOMY WITH BILATERAL SALPINGO OOPHORECTOMY;  Surgeon: [name], MD PHD;  Location: [facility];  Service: Gynecology;  Laterality: Bilateral;  . Abdominal hysterectomy  10/2011    complete    Past Medical Hx:  Past Medical History  Diagnosis Date  . Congenital birth defect     Right hand  . Anemia   . Arthritis     knees  . Endometrial cancer   . Anxiety   . Swelling     ANKLES - TAKES LASIX  . Depression   . HLD (hyperlipidemia)     borderline  . Obesity   . Hx of radiation therapy 04/07/13- 05/14/13    pelvis 45 gray 25 fx    Past Gynecological History:  G2 P1 Menarche 15, Patient's last menstrual period was 09/20/2012. No h/o abn pap test  Family Hx:  Family History  Problem Relation Age of Onset  . Pancreatic cancer Maternal Grandfather     or liver cancer  . Hypertension Maternal Grandfather   . Cancer Maternal Grandfather     pancreatic  . Hypertension Maternal Grandmother   . Diabetes Mother   . Colon polyps Maternal Aunt   . Cancer Maternal Aunt     lung  .  Colon cancer Cousin   . Cancer Cousin     lung  . Lung cancer Maternal Aunt    Mat aunt lung cancer Dx 52 (tob use) Mat GF pancreatic cancer Mat cousin lung cancer dx 65's (tob use)  Review of Systems:  Constitutional  Feels well, weight gain, no malaise. Cardiovascular  No chest pain, shortness of breath, or edema  Pulmonary   No cough or wheeze.  Gastro Intestinal  No nausea, vomitting, or diarrhoea. Reports constipation, no rectal bleeding. Genito Urinary  No frequency, urgency, dysuria,no vaginal spotting or discharge.  Is compliant with the use of her vaginal dilator Musculo Skeletal  No myalgia, arthralgia, joint swelling or pain   Physical Exam: BP 130/84  Pulse 98  Temp(Src) 98.9 F (37.2 C) (Oral)  Resp 16  Ht 5' 6.3" (1.684 m)  Wt 272 lb 4.8 oz (123.514 kg)  BMI 43.55 kg/m2  LMP 09/20/2012  WD in NAD CHEST:  CTA CARDIAC:  RRR ABDOMEN :  Soft nontender. Trocar sites done in the intensive mass or hernia.Normoactive bowel sounds, abdomen soft, non-tender and morbidly obese.  BACK:  No CVAT LN:  No cervical supra clavicular or inguinal adenopathy PELVIC:  Nl EGBUS, Vaginal cuff intact.  No discharge, bleeding or tenderness or cul de sac nodularity  Assessment/Plan:  [name]  is a 48 y.o.  year old with grade 2 endometrial adenocarcinoma noted on  the endometrial biopsy and endocervical curettage specimens. She underwent robotic total laparoscopic hysterectomy bilateral salpingo-oophorectomy. At the time of surgery diffuse miliary cystic-like lesions were noted in the cul-de-sac on on the rectosigmoid colon highly suspicious for metastatic disease. Because of these findings lymph node dissection was not performed. Final pathology was notable for grade 2 endometrioid endometrial adenocarcinoma with 12.5% myometrial invasion no lymphovascular space invasion no cervical involvement without confirmation of metastases in the eplic systs.    A CT scan of the abdomen and pelvis was collected to assess the pelvic and periaortic lymph nodes since these were not surgically assessed.  The imaging was suspicios for pelvic LN involvement, stage IIIC1  She has completed chemoradiation sandwich therapy 07/24/2013.   F/U with Dr. Jaynie Crumble 05/2014 F/U with Dr. Marko Plume as scheduled. F/U with Gyn Onc  08/2014 Counseled on the signs and symptoms of recurrence

## 2014-02-25 ENCOUNTER — Ambulatory Visit: Payer: Medicaid Other | Admitting: Gynecologic Oncology

## 2014-02-25 ENCOUNTER — Telehealth: Payer: Self-pay | Admitting: Family Medicine

## 2014-02-25 NOTE — Telephone Encounter (Signed)
Would like to take to dr Caryl Bis nurses about some medications She would not tell which ones

## 2014-02-25 NOTE — Telephone Encounter (Signed)
Her LDL last time was right at 100 which is adequate and is a number that I am happy with. There is really no indication to check this lab this soon as her cholesterol is in a good range and there do not appear to be any comorbid conditions that necessitate checking this frequently. Please advise her of this.

## 2014-02-25 NOTE — Telephone Encounter (Signed)
Pt would like to know if you can place an order for her lipids and she will come next week to get it done.  Also she has been taking her slow mag pills 2 tabs at the same time daily, but pharmacist told her she needed to take it 1 tab BID since her magnesium was low.  She also wants to know when you want her to follow up with you.  Deborah [name],CMA

## 2014-02-25 NOTE — Telephone Encounter (Signed)
Pt is aware.  She still wants to check her cholesterol since last time it was borderline.  Can you place a future order so she can come next week to get it?  Wildon Cuevas,CMA

## 2014-02-25 NOTE — Telephone Encounter (Signed)
She should take the mag once in the am and once in the pm. Her last lipid panel was in August of last year so may not be covered. She can schedule a f/u for some time in the next month.

## 2014-03-01 NOTE — Telephone Encounter (Signed)
Pt is aware of this. Deborah [name],CMA  

## 2014-03-24 ENCOUNTER — Telehealth: Payer: Self-pay | Admitting: Oncology

## 2014-03-24 NOTE — Telephone Encounter (Signed)
Deborah [name] called and requested a refill on diazepam (VALIUM) 5 MG tablet 02/22/2014 Sig - Route: Take 5 mg by mouth every 8 (eight) hours as needed for anxiety (30 tablets)  She would like it called in to [facility] on [address].

## 2014-03-24 NOTE — Telephone Encounter (Signed)
Called in refill to [facility]. per Dr. Sondra Come for Diazepam 5 mg tablets  - take 1 tablet q 8 hours as needed for anxiety.  Called Juna back to let her know.

## 2014-04-20 ENCOUNTER — Ambulatory Visit: Payer: Medicaid Other | Admitting: Gynecologic Oncology

## 2014-04-23 ENCOUNTER — Telehealth: Payer: Self-pay | Admitting: Family Medicine

## 2014-04-23 ENCOUNTER — Other Ambulatory Visit: Payer: Self-pay | Admitting: Family Medicine

## 2014-04-23 DIAGNOSIS — C541 Malignant neoplasm of endometrium: Secondary | ICD-10-CM

## 2014-04-23 DIAGNOSIS — E876 Hypokalemia: Secondary | ICD-10-CM

## 2014-04-23 MED ORDER — POTASSIUM CHLORIDE ER 10 MEQ PO TBCR: EXTENDED_RELEASE_TABLET | ORAL | Status: DC

## 2014-04-23 NOTE — Telephone Encounter (Signed)
I have reordered the patients potassium. She should be advised to come in for repeat BMET and magnesium to see if she still needs these supplements. Thanks.

## 2014-04-23 NOTE — Telephone Encounter (Signed)
Pt informed and already has appt 04/29/14. Deborah [name]

## 2014-04-23 NOTE — Telephone Encounter (Signed)
Pt called and needs a refill on her potassium sent to the pharmacy. jw

## 2014-04-28 ENCOUNTER — Other Ambulatory Visit: Payer: Self-pay | Admitting: Radiation Oncology

## 2014-04-28 ENCOUNTER — Telehealth: Payer: Self-pay | Admitting: Oncology

## 2014-04-28 MED ORDER — DIAZEPAM 5 MG PO TABS: 5.0000 mg | ORAL_TABLET | Freq: Three times a day (TID) | ORAL | Status: DC | PRN

## 2014-04-28 NOTE — Telephone Encounter (Signed)
Deborah [name] called requesting a refill on diazapam 5 mg tablets.  She would like the refill sent to [facility] on [address].

## 2014-04-28 NOTE — Telephone Encounter (Signed)
Called in refill to [facility] for diazepam (VALIUM) 5 MG tablet 04/28/2014 Sig - Route: Take 5 mg by mouth every 8 (eight) hours as needed for anxiety (30 tablets, 0 refills).  Called Siriah to let her know that it had been called in.

## 2014-04-29 ENCOUNTER — Ambulatory Visit: Payer: Medicaid Other | Admitting: Family Medicine

## 2014-05-11 ENCOUNTER — Encounter: Payer: Self-pay | Admitting: Gynecologic Oncology

## 2014-05-11 ENCOUNTER — Ambulatory Visit: Payer: Medicaid Other | Attending: Gynecologic Oncology | Admitting: Gynecologic Oncology

## 2014-05-11 VITALS — BP 122/70 | HR 94 | Temp 98.6°F | Resp 18 | Ht 64.0 in | Wt 302.8 lb

## 2014-05-11 DIAGNOSIS — Z90710 Acquired absence of both cervix and uterus: Secondary | ICD-10-CM

## 2014-05-11 DIAGNOSIS — F411 Generalized anxiety disorder: Secondary | ICD-10-CM | POA: Diagnosis not present

## 2014-05-11 DIAGNOSIS — F329 Major depressive disorder, single episode, unspecified: Secondary | ICD-10-CM | POA: Insufficient documentation

## 2014-05-11 DIAGNOSIS — C549 Malignant neoplasm of corpus uteri, unspecified: Secondary | ICD-10-CM | POA: Diagnosis not present

## 2014-05-11 DIAGNOSIS — Z8542 Personal history of malignant neoplasm of other parts of uterus: Secondary | ICD-10-CM

## 2014-05-11 DIAGNOSIS — E785 Hyperlipidemia, unspecified: Secondary | ICD-10-CM | POA: Insufficient documentation

## 2014-05-11 DIAGNOSIS — F3289 Other specified depressive episodes: Secondary | ICD-10-CM | POA: Diagnosis not present

## 2014-05-11 DIAGNOSIS — C541 Malignant neoplasm of endometrium: Secondary | ICD-10-CM

## 2014-05-11 NOTE — Progress Notes (Signed)
Office Visit:  GYN ONCOLOGY   CC: Endometrial cancer surveillance  HPI:48 y/o G2 P1 LNMP  06/2012.  Patient reports intermittent spotting between menses for over 1 year.  Occasional menorrhagia.  Last pap more than two years prior.  20 pound weight loss over the past year.  [name] was referred to Dr. Hulan [name] who collected an endometrial biopsy and a endocervical biopsy. Both are positive for endometrial adenocarcinoma.  On 10/07/2012 she underwent robotic hysterectomy bilateral salpingo-oophorectomy. At the time of surgery multiple cystic lesions were noted throughout the entire pelvis that was suspicious for metastatic disease, as such lymph node dissection was not collected but these lesions were biopsied. Final pathology was notable for  1. Soft tissue, biopsy, right para-colic gutter - BENIGN PARA-COLIC GUTTER SOFT TISSUE WITH BENIGN CYST (4.0 CM). SEE COMMENT. - NEGATIVE FOR MALIGNANCY. 2. Uterus +/- tubes/ovaries, neoplastic - ENDOMETRIAL ADENOCARCINOMA, ENDOMETRIOID-TYPE, SEE COMMENT. - TUMOR INVADES LESS THAN 1/2 OF MYOMETRIAL THICKNESS.- NO LYMPHOVASCULAR INVASION IDENTIFIED. - TUMOR INVADES INTO LOWER UTERINE SEGMENT. - UTERINE ADENOMYOSIS. - BENIGN RIGHT AND LEFT OVARIES; NO ATYPIA OR MALIGNANCY PRESENT. - BENIGN RIGHT AND LEFT FALLOPIAN TUBES WITH BENIGN SEROUS-TYPE PARATUBAL CYSTS. - BENIGN CERVIX; NEGATIVE FOR INTRAEPITHELIAL LESION OR MALIGNANCY. - BENIGN MULTILOCULAR UTERINE SEROSAL CYST.  CT abdomen and pelvis 11/25/2012  IMPRESSION:  1. Shotty bilateral external iliac lymphadenopathy measuring up to 1.3 cm, suspicious for metastatic disease. Nonspecific less than 5 mm retroperitoneal lymph nodes also noted in the left para-aortic region. 2. 2.7 cm postop lymphocele versus low attenuation lymphadenopathy in the proximal left external iliac chain. 3. Cholelithiasis and right nephrolithiasis incidentally noted.   End of treatment CT 08/25/2013 notable for no evidence of abdominal  lymphadenopathy. There Is no evidence of inflammatory process abscess or ascites the adnexal regions are unremarkable in appearance there is no new or progressive disease within the abdomen or pelvis  Reports weight gain since end of treatment.  Is doing well.  Alopecia has resolved.  Pap 11/2013 wnl  Past Surgical Hx:  Past Surgical History  Procedure Laterality Date  . Hiatal hernia repair    . Repair vaginal cuff  10/07/2012    Procedure: REPAIR VAGINAL CUFF;  Surgeon: [name], MD PHD;  Location: [facility];  Service: Gynecology;;  . Robotic assisted total hysterectomy with bilateral salpingo oopherectomy  10/07/2012    Procedure: ROBOTIC ASSISTED TOTAL HYSTERECTOMY WITH BILATERAL SALPINGO OOPHORECTOMY;  Surgeon: [name], MD PHD;  Location: [facility];  Service: Gynecology;  Laterality: Bilateral;  . Abdominal hysterectomy  10/2011    complete    Past Medical Hx:  Past Medical History  Diagnosis Date  . Congenital birth defect     Right hand  . Anemia   . Arthritis     knees  . Endometrial cancer   . Anxiety   . Swelling     ANKLES - TAKES LASIX  . Depression   . HLD (hyperlipidemia)     borderline  . Obesity   . Hx of radiation therapy 04/07/13- 05/14/13    pelvis 45 gray 25 fx    Past Gynecological History:  G2 P1 Menarche 15, Patient's last menstrual period was 09/20/2012. No h/o abn pap test  Family Hx:  Family History  Problem Relation Age of Onset  . Pancreatic cancer Maternal Grandfather     or liver cancer  . Hypertension Maternal Grandfather   . Cancer Maternal Grandfather     pancreatic  . Hypertension Maternal Grandmother   . Diabetes  Mother   . Colon polyps Maternal Aunt   . Cancer Maternal Aunt     lung  . Colon cancer Cousin   . Cancer Cousin     lung  . Lung cancer Maternal Aunt    Mat aunt lung cancer Dx 17 (tob use) Mat GF pancreatic cancer Mat cousin lung cancer dx 72's (tob use)  Review of Systems:  Constitutional  Feels well,  weight gain, no malaise. Cardiovascular  No chest pain, shortness of breath, or edema  Pulmonary  No cough or wheeze.  Gastro Intestinal  No nausea, vomitting, or diarrhoea. No rectal bleeding. Genito Urinary  No frequency, urgency, dysuria,no vaginal spotting or discharge.  Is compliant with the use of her vaginal dilator Musculo Skeletal  No myalgia, arthralgia, joint swelling or pain   Physical Exam: BP 122/70  Pulse 94  Temp(Src) 98.6 F (37 C)  Resp 18  Ht 5\' 4"  (1.626 m)  Wt 302 lb 12.8 oz (137.349 kg)  BMI 51.95 kg/m2  LMP 09/20/2012 Wt Readings from Last 3 Encounters:  05/11/14 302 lb 12.8 oz (137.349 kg)  02/11/14 292 lb 6.4 oz (132.632 kg)  01/29/14 292 lb (132.45 kg)    WD in NAD CHEST:  CTA CARDIAC:  RRR ABDOMEN :  Soft nontender. Trocar sites without evidence of mass or hernia.Normoactive bowel sounds, abdomen soft, non-tender and morbidly obese.  BACK:  No CVAT LN:  No cervical supra clavicular or inguinal adenopathy PELVIC:  Nl EGBUS, Vaginal cuff intact.  No discharge, bleeding or tenderness or cul de sac nodularity Rectal"  Good tone no masses EXT:  Missing digits on the left hand.  No lower extremity edema or cyanosis.  Assessment/Plan:  [name]  is a 48 y.o.  year old with grade 2 endometrial adenocarcinoma noted on  the endometrial biopsy and endocervical curettage specimens. She underwent robotic total laparoscopic hysterectomy bilateral salpingo-oophorectomy. At the time of surgery diffuse miliary cystic-like lesions were noted in the cul-de-sac on on the rectosigmoid colon highly suspicious for metastatic disease. Because of these findings lymph node dissection was not performed. Final pathology was notable for grade 2 endometrioid endometrial adenocarcinoma with 12.5% myometrial invasion no lymphovascular space invasion no cervical involvement without confirmation of metastases in the eplic systs.    A CT scan of the abdomen and pelvis was  collected to assess the pelvic and periaortic lymph nodes since these were not surgically assessed.  The imaging was suspicios for pelvic LN involvement, stage IIIC1  She has completed chemoradiation sandwich therapy 07/24/2013.   F/U with Dr. Jaynie Crumble  07/2014 F/U with Gyn Onc 10/2014 Counseled on the signs and symptoms of recurrence

## 2014-05-17 ENCOUNTER — Telehealth: Payer: Self-pay | Admitting: Oncology

## 2014-05-17 NOTE — Telephone Encounter (Signed)
Deborah [name] called and requested a refill on her diazepam 5 mg tablets.  She said she has been taking them for anxiety and back spasms.  She said she has been having back spasms for a long time.  She has 3 tablets left and would like the script called in to [facility] on [address].

## 2014-05-17 NOTE — Telephone Encounter (Signed)
pt called to sched appt....done....pt ok adna ware °

## 2014-05-17 NOTE — Telephone Encounter (Signed)
Called in refill per Dr. Lisbeth Renshaw to [facility] on [name] for diazepam 5 mg tablets - take 5 mg by mouth every 8 hours as needed for anxiety. Dispense 30 tablets, 0 refills.  Called and left a message for Loletta letting her know the prescription has been refilled.

## 2014-05-24 ENCOUNTER — Telehealth: Payer: Self-pay | Admitting: Family Medicine

## 2014-05-24 DIAGNOSIS — C541 Malignant neoplasm of endometrium: Secondary | ICD-10-CM

## 2014-05-24 MED ORDER — POTASSIUM CHLORIDE ER 10 MEQ PO TBCR: EXTENDED_RELEASE_TABLET | ORAL | Status: DC

## 2014-05-24 NOTE — Telephone Encounter (Signed)
Patient needs refill on potassium. Patient taking last dosage today. Needs enough to last her until 06/02/14, which is he appt with Dr. Caryl Bis.

## 2014-05-24 NOTE — Telephone Encounter (Signed)
Pt is aware that rx was sent to pharmacy. Jazmin Hartsell,CMA

## 2014-05-24 NOTE — Telephone Encounter (Signed)
Refill given. Will see patient at her visit on 06/02/14.

## 2014-06-02 ENCOUNTER — Encounter: Payer: Self-pay | Admitting: Family Medicine

## 2014-06-02 ENCOUNTER — Ambulatory Visit (INDEPENDENT_AMBULATORY_CARE_PROVIDER_SITE_OTHER): Payer: Medicaid Other | Admitting: Family Medicine

## 2014-06-02 VITALS — BP 116/82 | HR 82 | Temp 98.1°F | Wt 306.0 lb

## 2014-06-02 DIAGNOSIS — G44209 Tension-type headache, unspecified, not intractable: Secondary | ICD-10-CM

## 2014-06-02 DIAGNOSIS — M545 Low back pain, unspecified: Secondary | ICD-10-CM

## 2014-06-02 DIAGNOSIS — IMO0002 Reserved for concepts with insufficient information to code with codable children: Secondary | ICD-10-CM

## 2014-06-02 DIAGNOSIS — M1710 Unilateral primary osteoarthritis, unspecified knee: Secondary | ICD-10-CM

## 2014-06-02 DIAGNOSIS — M170 Bilateral primary osteoarthritis of knee: Secondary | ICD-10-CM

## 2014-06-02 DIAGNOSIS — E612 Magnesium deficiency: Secondary | ICD-10-CM

## 2014-06-02 DIAGNOSIS — E876 Hypokalemia: Secondary | ICD-10-CM

## 2014-06-02 MED ORDER — CYCLOBENZAPRINE HCL 10 MG PO TABS: 10.0000 mg | ORAL_TABLET | Freq: Three times a day (TID) | ORAL | Status: DC | PRN

## 2014-06-02 MED ORDER — SERTRALINE HCL 100 MG PO TABS: ORAL_TABLET | ORAL | Status: DC

## 2014-06-02 MED ORDER — DICLOFENAC SODIUM 75 MG PO TBEC: 75.0000 mg | DELAYED_RELEASE_TABLET | Freq: Two times a day (BID) | ORAL | Status: DC | PRN

## 2014-06-02 NOTE — Assessment & Plan Note (Signed)
Headache sounds like a migraine given photophobia and phonophobia. No neurological abnormalities on exam to indicate intracranial process. Patient has not tried NSAIDs for relief at this time. Will see if diclofenac being prescribed for OA helps with headache. If not we will need to explore migraine treatment. Given return precautions. F/u in one month.

## 2014-06-02 NOTE — Patient Instructions (Signed)
Nice to see you. I think your headaches are related to migraine.  We are going to start a medication to help with your arthritic pain in your knees. I hope this will help with your headaches as well. If this does not help please let us know. If your develop changes in vision, weakness, numbness, dizziness, or fevers please let us know.

## 2014-06-02 NOTE — Progress Notes (Signed)
Patient ID: Deborah [name], female   DOB: Jan 09, 1966, 48 y.o.   MRN: [id number]  Tommi Rumps, MD Phone: [phone]  Deborah [name] is a 48 y.o. female who presents today for f/u.  Headache: patient notes intermittent headache for the past month. She notes it occurs on both sides in the area of her temples and in the back of her head. It is a pressure. She denies sinus issues and notes her rhinitis medications have not helped. She has a history of HA and notes this is typical of her prior headaches, other than that it has gone on for a month. She has tried tylenol 1000 mg TID prn for this without much benefit. She notes ~2 days a week she will wake up with headache. She denies fevers, weakness, and numbness. She endorses photophobia and phonophobia. She has a history of endometrial cancer s/p chemo and radiation.  Knee pain: bilateral pain. She has know OA seen on standing films previously. The pain is along the lateral joint line. She has tried taking tylenol for this without much improvement. Has used icy hot as well. Hurts every day. She has never gone to PT previously.  Back pain: notes low back pain for a long time. Is not midline. It is in the low lumbar side regions. She previously had an MRI of her lumbar spine 05/10/13 with mild to moderate degenerative changes, no signs of mets, and no nerve root compression. She has not been to PT for this, though has been evaluated by sports medicine at multiple offices for this. She denies urinary issues, BM issues, and saddle anesthesia. She does have a history of cancer.  Patient is a nonsmoker.   ROS: Per HPI   Physical Exam Filed Vitals:   06/02/14 1101  BP: 116/82  Pulse: 82  Temp: 98.1 F (36.7 C)    Gen: Well NAD HEENT: PERRL,  MMM Lungs: CTABL Nl WOB Heart: RRR no MRG MSK: back with no signs of swelling, there is no midline tenderness to palpation, there is minimal tenderness in the paraspinous muscles bilaterally in the  lumbar region, her knees bilaterally are tender to palpation on the lateral joint line, it is difficult to appreciate any swelling in her knees given the amount of overlying adipose tissue, there is no ligamentous laxity, negative mcmurrays Neuro: CN 2-12 intact bilaterally, 5/5 strength in left grip, bilateral biceps, triceps, deltoid, quads, hamstrings, plantar and dorsiflexion, sensation to light touch is intact, gait is normal, absent patellar reflexes bilaterally Exts: Non edematous BL  LE, warm and well perfused.    Assessment/Plan: Please see individual problem list.  # Healthcare maintenance: needs tdap at next visit

## 2014-06-02 NOTE — Assessment & Plan Note (Signed)
Magnesium and BMET ordered today.

## 2014-06-02 NOTE — Assessment & Plan Note (Addendum)
Patient with known degenerative changes in her back. No neurological dysfunction on exam. Only red flag is hx of cancer though no midline tenderness or pain. Had previous MRI that revealed only degenerative changes. Diclofenac prescribed. Given exercises for her back. Will see back in one month. Given return precautions.

## 2014-06-02 NOTE — Assessment & Plan Note (Signed)
Patient with continued pain in her knees related to OA. Will give trial of diclofenac as the patient averse to injections at this time. Discussed leg muscle strengthening to help reduce pain. If this does not help will need to consider injections, and given her difficult external anatomy she may need a referral back to [facility] for this issue. Advised to d/c diclofenac if develops stomach discomfort.

## 2014-06-03 ENCOUNTER — Telehealth: Payer: Self-pay | Admitting: *Deleted

## 2014-06-03 LAB — BASIC METABOLIC PANEL
BUN: 17 mg/dL (ref 6–23)
CHLORIDE: 102 meq/L (ref 96–112)
CO2: 23 meq/L (ref 19–32)
CREATININE: 0.75 mg/dL (ref 0.50–1.10)
Calcium: 9.2 mg/dL (ref 8.4–10.5)
GLUCOSE: 93 mg/dL (ref 70–99)
POTASSIUM: 3.7 meq/L (ref 3.5–5.3)
Sodium: 136 mEq/L (ref 135–145)

## 2014-06-03 LAB — MAGNESIUM: Magnesium: 1.3 mg/dL — ABNORMAL LOW (ref 1.5–2.5)

## 2014-06-03 NOTE — Telephone Encounter (Signed)
Message copied by Valerie Roys on Thu Jun 03, 2014  9:01 AM ------      Message from: Caryl Bis, ERIC G      Created: Thu Jun 03, 2014  6:21 AM       Patients magnesium is still low. Please inform her that she should continue her magnesium supplements. Thanks. ------

## 2014-06-03 NOTE — Telephone Encounter (Signed)
Patients potassium is still on the low end of normal. I would like her to continue her potassium supplement for now. Please inform her of this. Thanks.

## 2014-06-03 NOTE — Telephone Encounter (Signed)
LM for patient with message from MD.  Sharon Rubis,CMA ° °

## 2014-06-03 NOTE — Telephone Encounter (Signed)
Pt is aware of results.  Would like to know if you still want her to continue taking her potassium supplement?  Please advise. Will call her back with answer at [phone].  Copy of results left up front for pick up. Sakai Wolford,CMA

## 2014-06-10 ENCOUNTER — Ambulatory Visit: Payer: Medicaid Other | Admitting: Radiation Oncology

## 2014-06-14 ENCOUNTER — Telehealth: Payer: Self-pay | Admitting: Oncology

## 2014-06-14 IMAGING — US US TRANSVAGINAL NON-OB
1 series · 14 of 25 positions shown · non-contrast
Comparison: Ultrasound dated 05/25/2004

CLINICAL DATA: New diagnosis of endometrial carcinoma.



[Series 1: us transvaginal non-ob · 0.32mm/px · 14 of 77 slices shown]
[im 1/77]
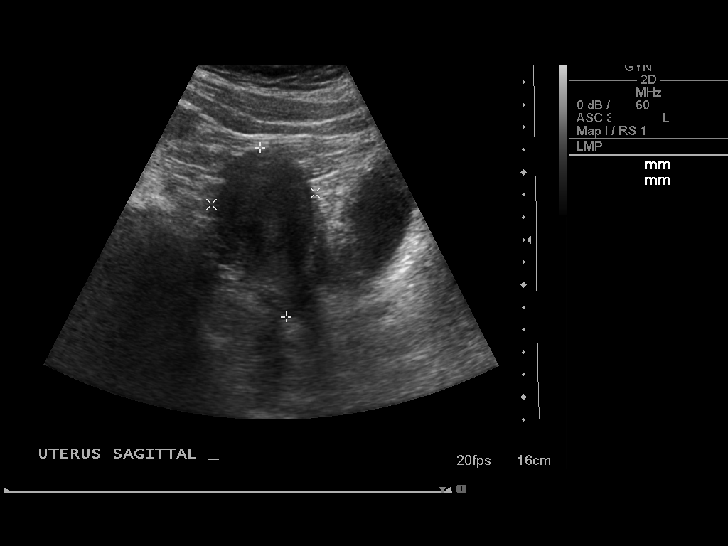
[im 7/77]
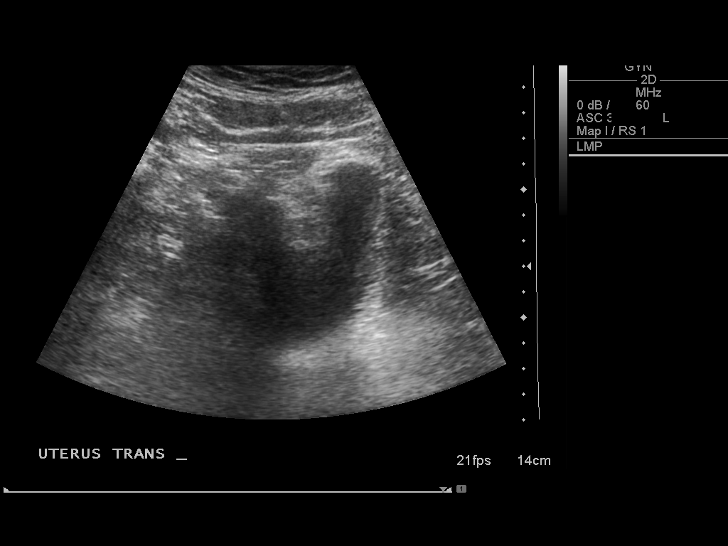
[im 13/77]
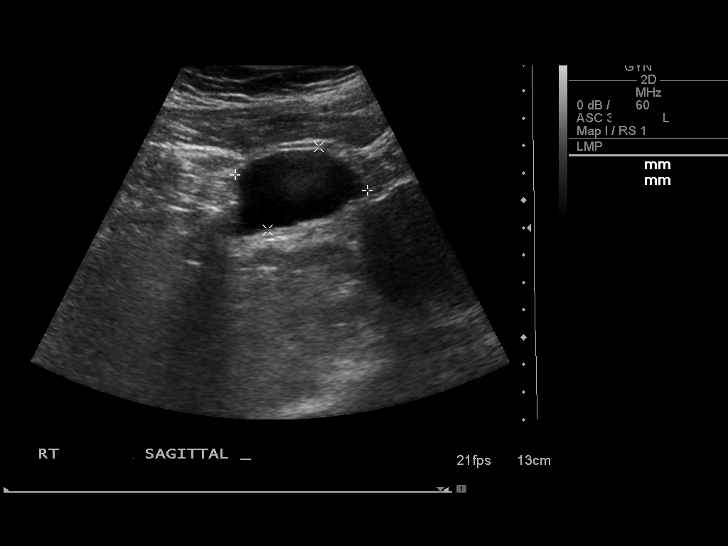
[im 20/77]
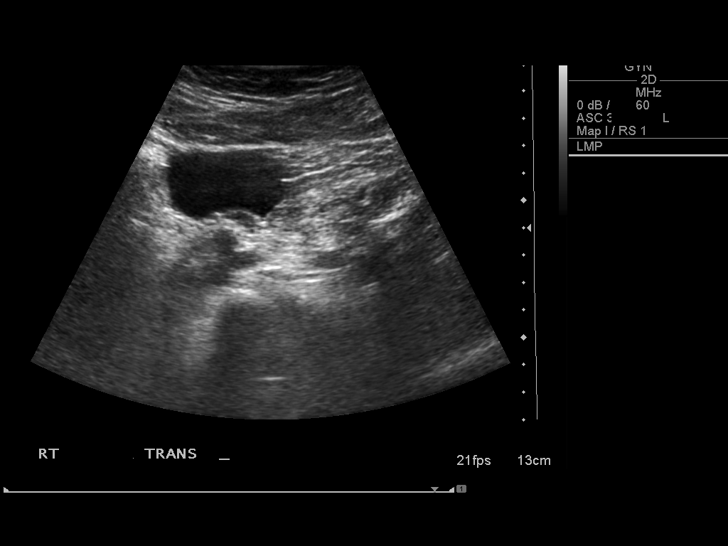
[im 26/77]
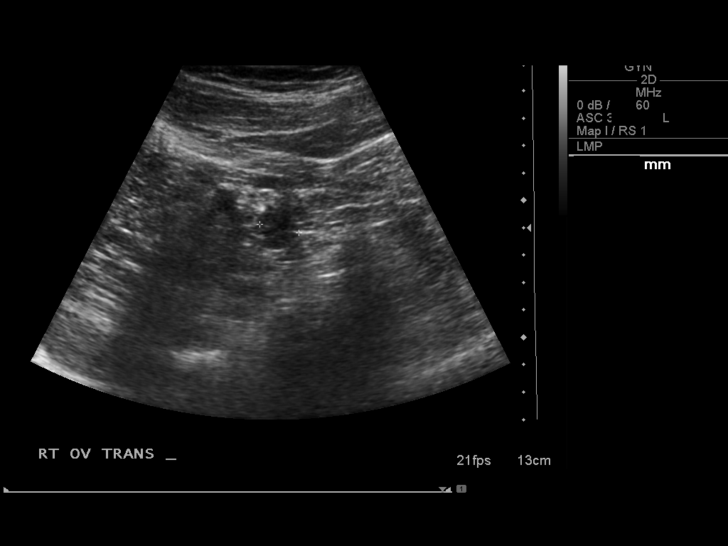
[im 29/77]
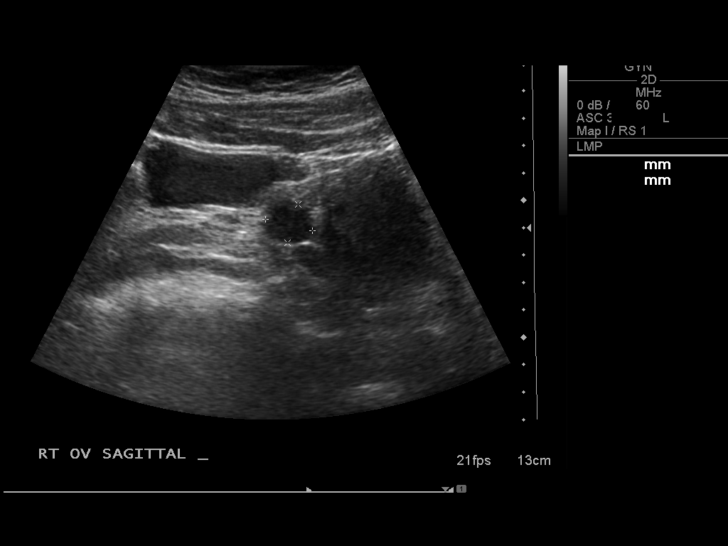
[im 35/77]
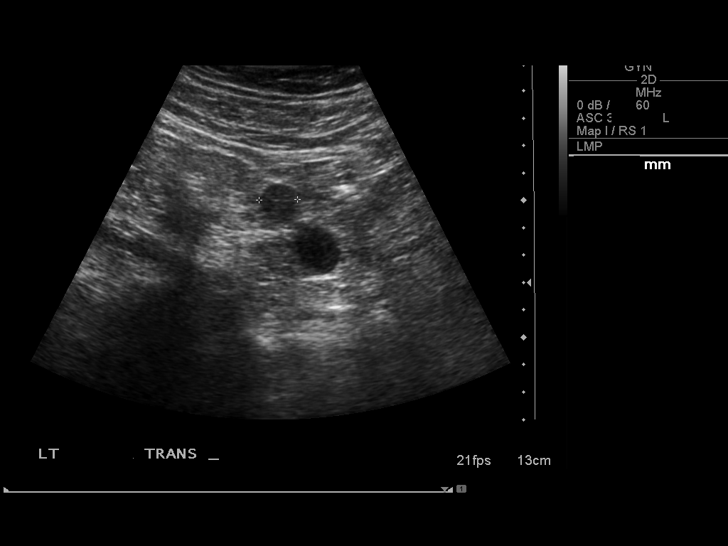
[im 42/77]
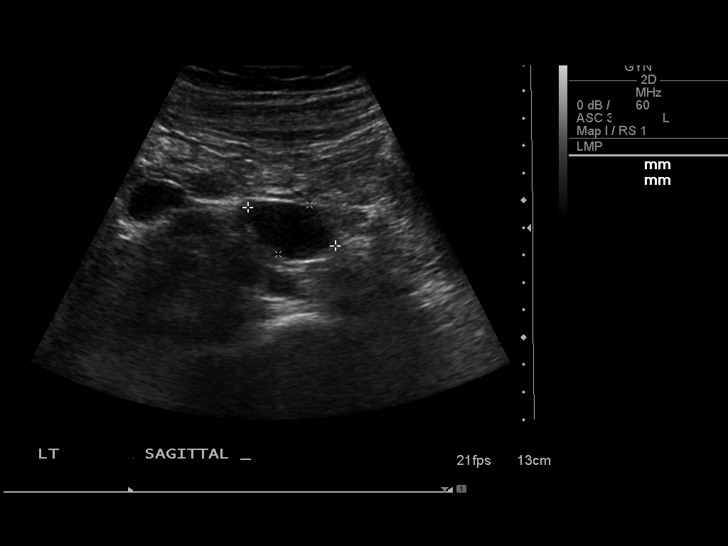
[im 48/77]
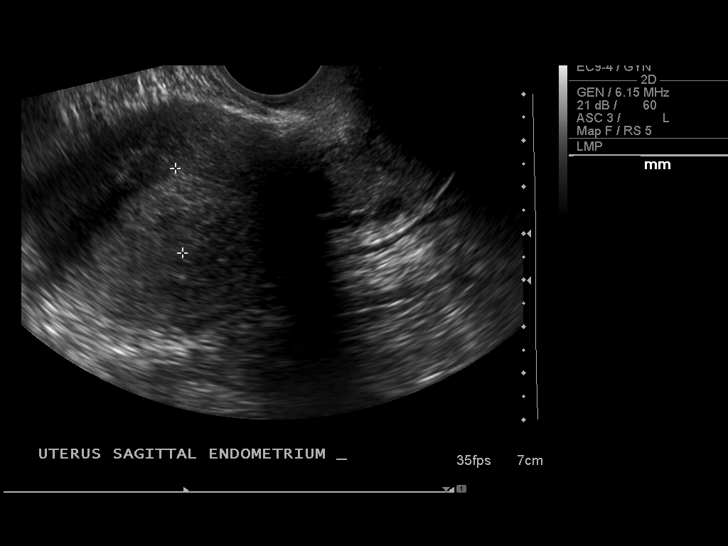
[im 51/77]
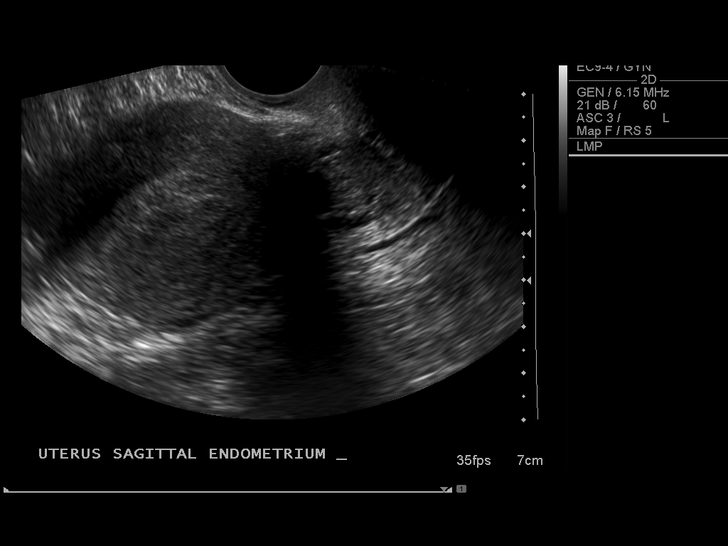
[im 58/77]
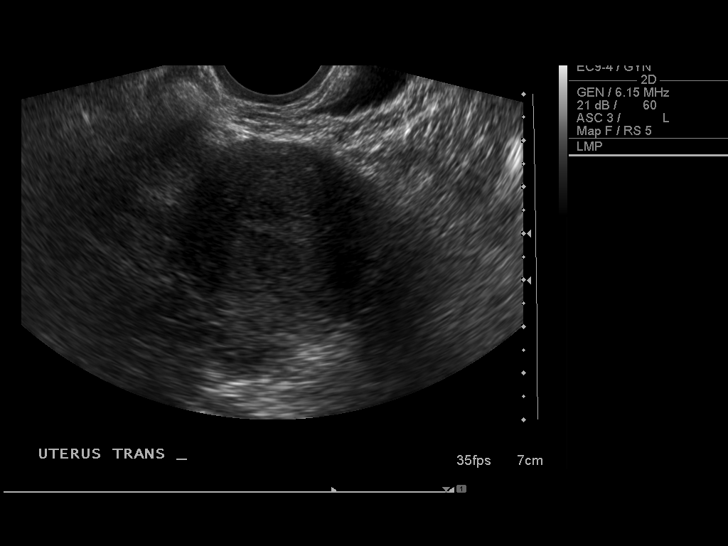
[im 64/77]
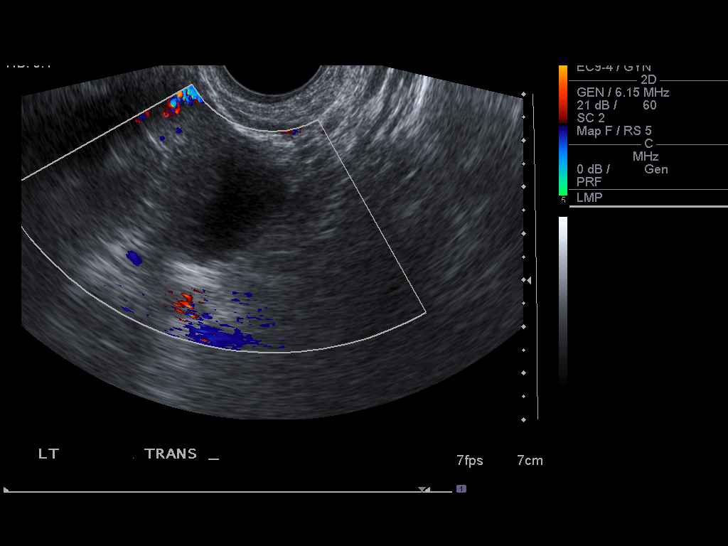
[im 70/77]
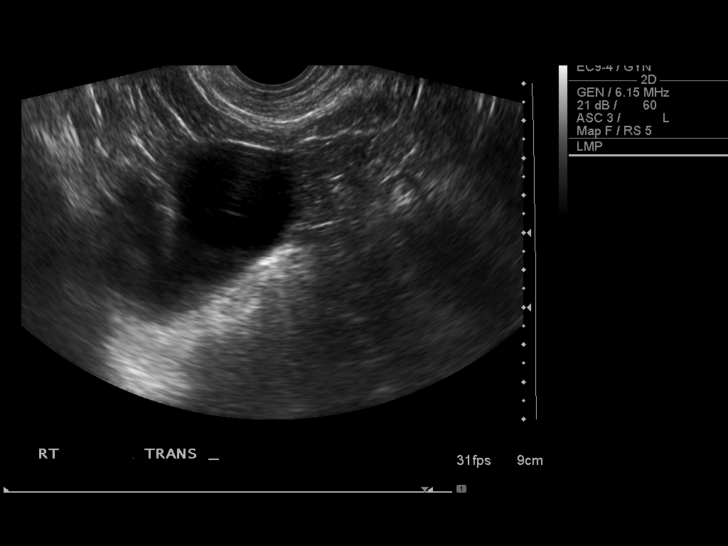
[im 77/77]
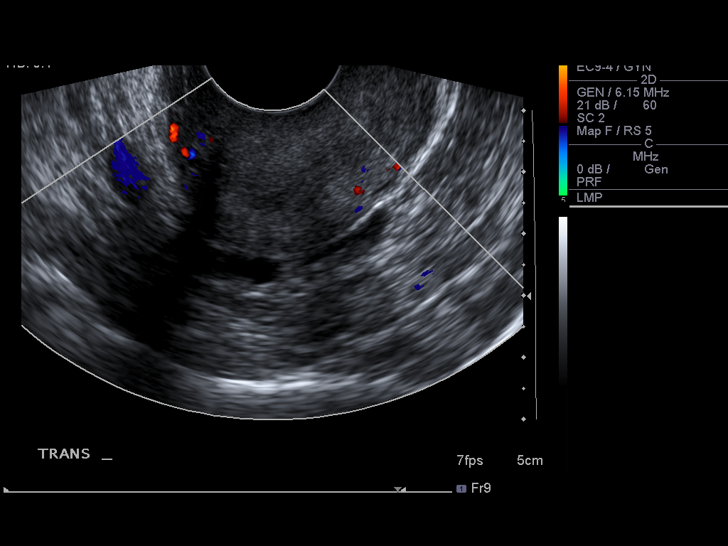

[14 of 25 positions shown; findings below may reference images not displayed]

FINDINGS: Uterus: 9.2 x 5.1 x 5.5 cm.  There are several Nabothian cysts.

Endometrium: Heterogeneous.  18.2 cm in thickness.

Right ovary:  Normal.  1.8 x 1.5 x 1.5 cm.

Left ovary: Normal.  2.2 x 1.6 x 1.4 cm.

Other findings: There are small oblong fluid collections in both
adnexal regions which may represent hydrosalpinges.  No free fluid
in the pelvis.
IMPRESSION: Abnormal thickening of the endometrium.  Possible bilateral
hydrosalpinges.

The size of the uterus on 08/25/2004 was 8.4 x 3.1 x 6.1 cm.
Overall uterine size has minimally changed since the prior exam.

## 2014-06-14 NOTE — Telephone Encounter (Signed)
Deborah [name] called requesting a refill on diazepam (VALIUM) 5 MG tablets.  She last had it filled on 05/17/14.

## 2014-06-15 ENCOUNTER — Telehealth: Payer: Self-pay | Admitting: Oncology

## 2014-06-15 ENCOUNTER — Telehealth: Payer: Self-pay | Admitting: *Deleted

## 2014-06-15 IMAGING — CR DG CHEST 2V
2 series · 2 of 2 positions shown · non-contrast
Comparison: None.

CLINICAL DATA: Preoperative evaluation

CHEST - 2 VIEW

[w chest pa]
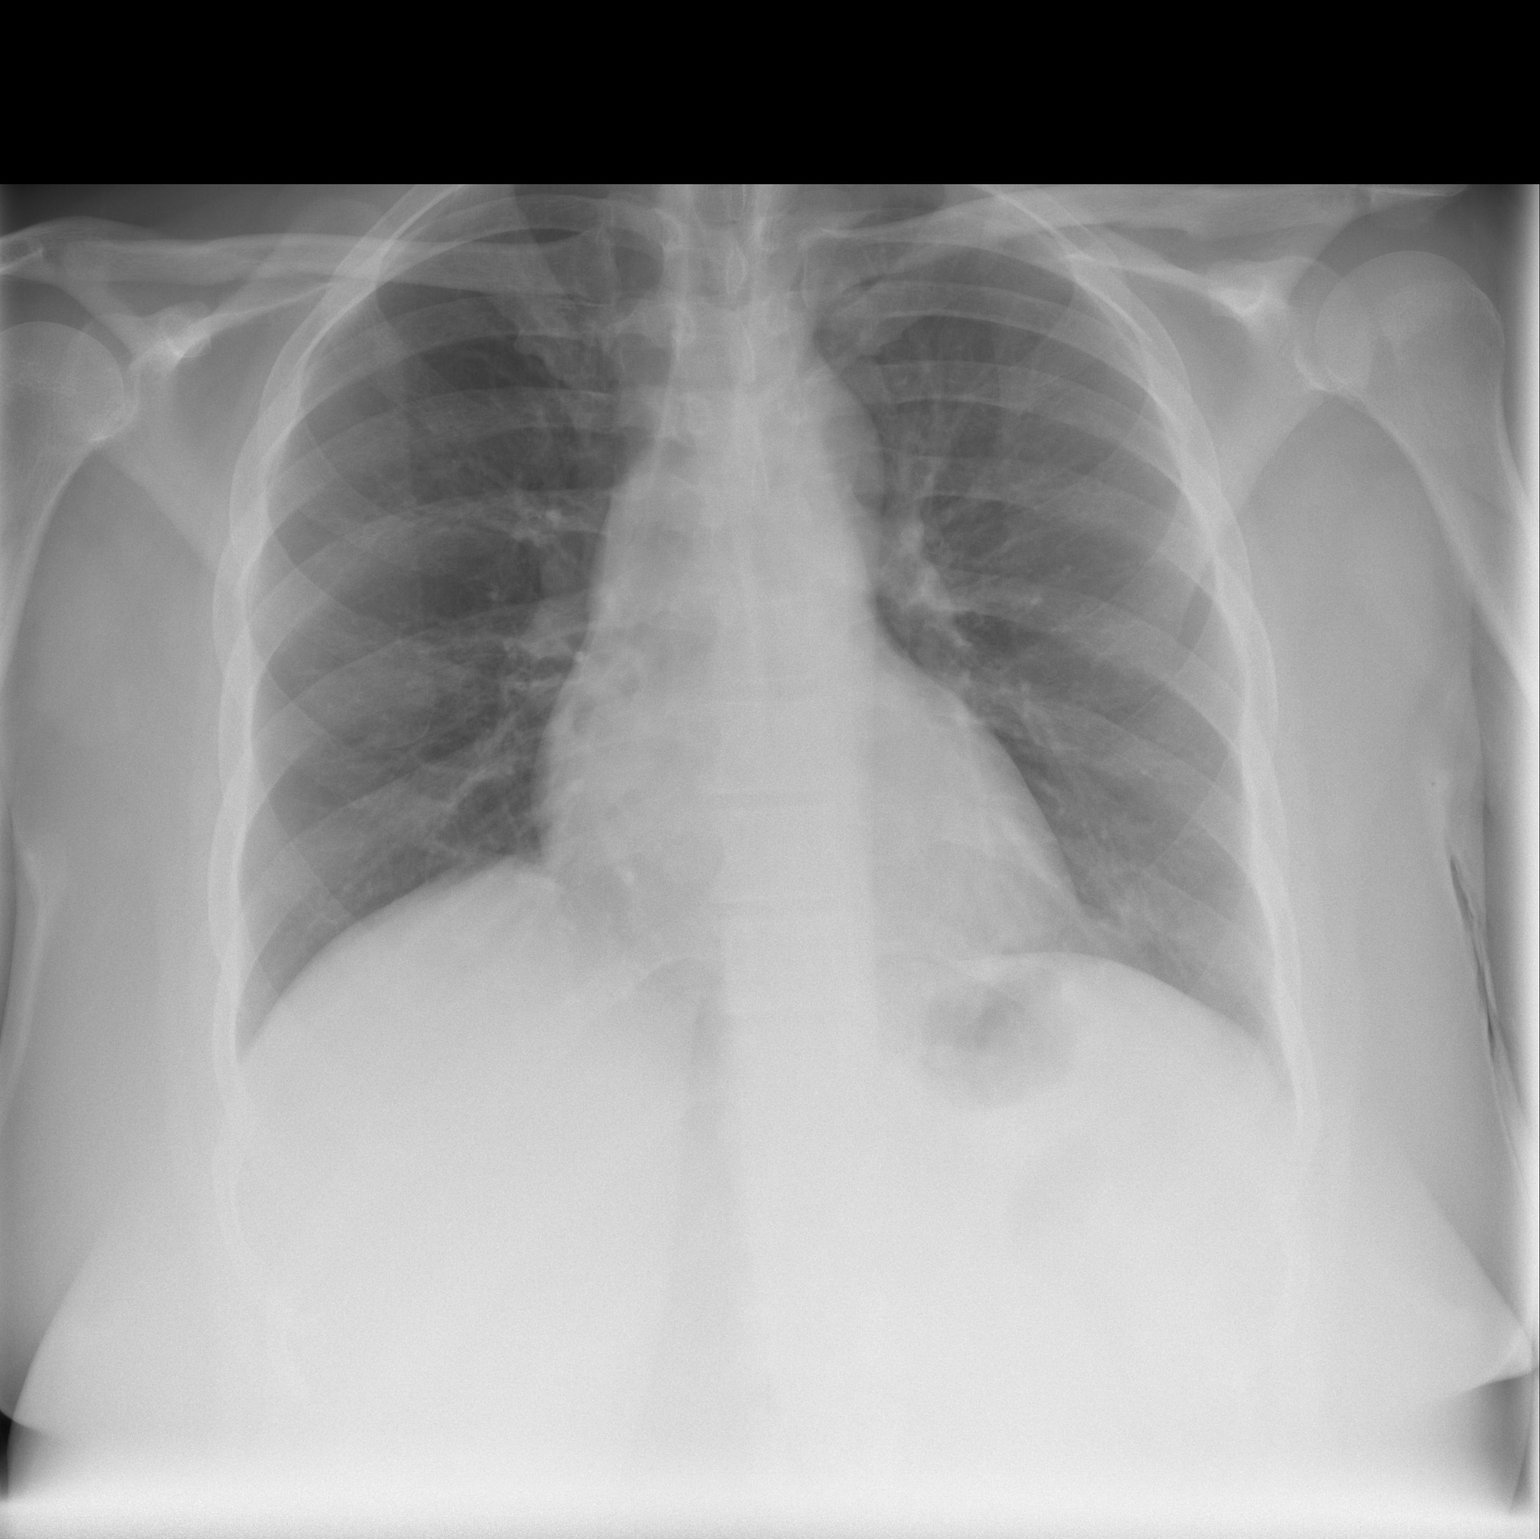

[w chest lat]
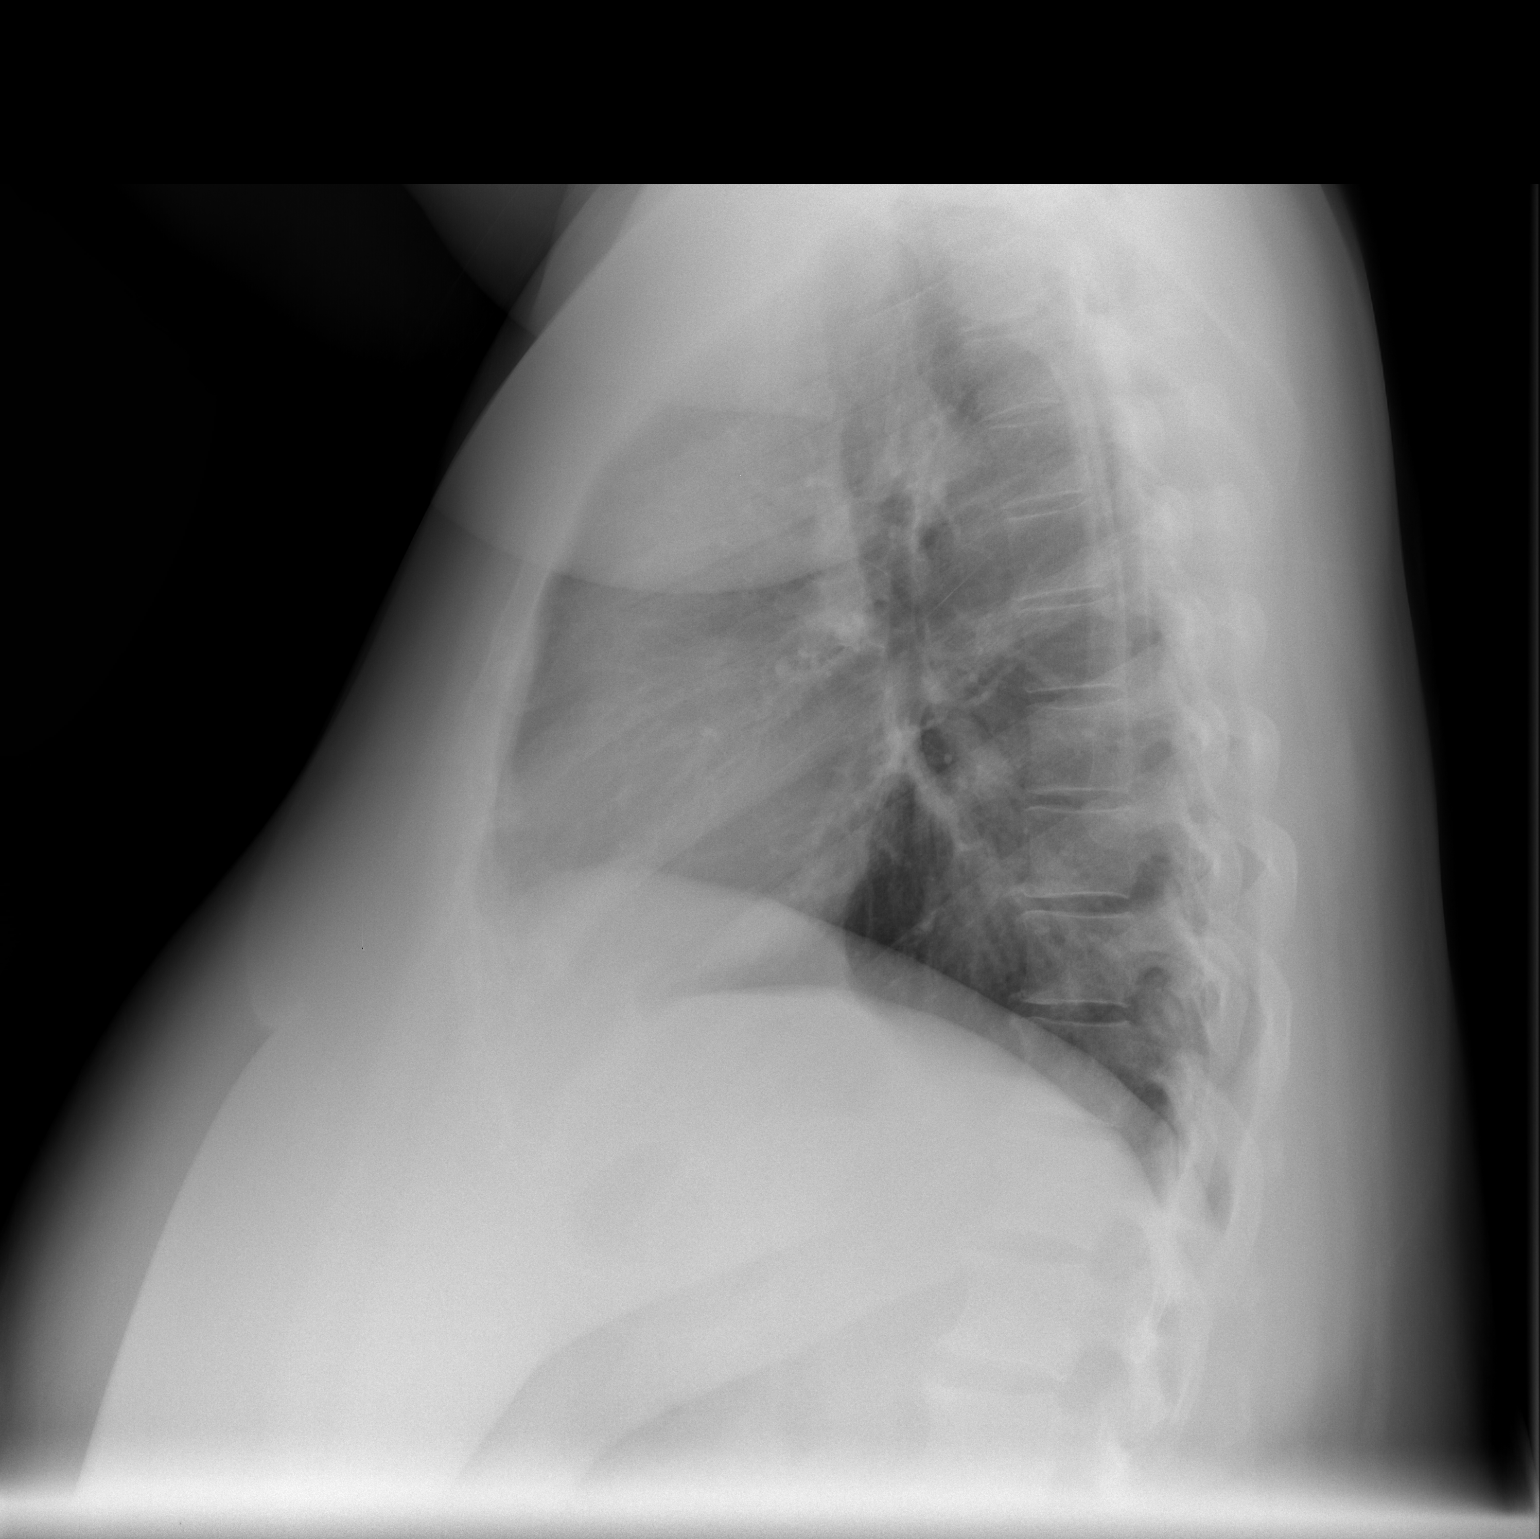

[2 of 2 positions shown; findings below may reference images not displayed]

FINDINGS: Heart and mediastinal contours are within normal limits.
The lung fields appear clear with no signs of focal infiltrate or
congestive failure.  No pleural fluid or significant peribronchial
cuffing is identified.  Bony structures appear intact.
IMPRESSION: No worrisome focal or acute cardiopulmonary abnormality seen.

## 2014-06-15 NOTE — Telephone Encounter (Signed)
Called in refill for Diazepam 5 mg po: 5 mg po every 8 hours as needed for anxiety ( 30 tablets, no refills). Dr. Blair Promise.

## 2014-06-17 ENCOUNTER — Ambulatory Visit: Payer: Medicaid Other | Admitting: Radiation Oncology

## 2014-06-25 ENCOUNTER — Telehealth: Payer: Self-pay | Admitting: Family Medicine

## 2014-06-25 DIAGNOSIS — C541 Malignant neoplasm of endometrium: Secondary | ICD-10-CM

## 2014-06-25 MED ORDER — POTASSIUM CHLORIDE ER 10 MEQ PO TBCR: EXTENDED_RELEASE_TABLET | ORAL | Status: DC

## 2014-06-25 NOTE — Telephone Encounter (Signed)
Refill sent to pharmacy.   

## 2014-06-25 NOTE — Telephone Encounter (Signed)
Needs refill on potassium

## 2014-07-14 ENCOUNTER — Telehealth: Payer: Self-pay | Admitting: Oncology

## 2014-07-14 NOTE — Telephone Encounter (Addendum)
Deborah [name] called and requested a refill on diazepam.  She would like it called in to [facility] on [address].  Called in refill to [facility] per Dr. Sondra Come for diazepam (VALIUM) 5 MG tablet 06/15/2014 Sig - Route: Take 5 mg by mouth every 8 (eight) hours as needed for anxiety. Disp. 30 tablets. 0 refills. - Oral.  Called Jerlean and let her know.

## 2014-07-22 ENCOUNTER — Other Ambulatory Visit: Payer: Self-pay | Admitting: Family Medicine

## 2014-07-22 DIAGNOSIS — D241 Benign neoplasm of right breast: Secondary | ICD-10-CM

## 2014-07-22 DIAGNOSIS — D242 Benign neoplasm of left breast: Principal | ICD-10-CM

## 2014-07-29 ENCOUNTER — Telehealth: Payer: Self-pay | Admitting: Family Medicine

## 2014-07-29 ENCOUNTER — Other Ambulatory Visit: Payer: Self-pay | Admitting: Family Medicine

## 2014-07-29 DIAGNOSIS — C541 Malignant neoplasm of endometrium: Secondary | ICD-10-CM

## 2014-07-29 MED ORDER — POTASSIUM CHLORIDE ER 10 MEQ PO TBCR: EXTENDED_RELEASE_TABLET | ORAL | Status: DC

## 2014-07-29 NOTE — Telephone Encounter (Signed)
Medication refilled. Please inform the patient. Thanks.

## 2014-07-29 NOTE — Telephone Encounter (Signed)
Pt called and would like a refill on her Potassium . jw

## 2014-08-04 ENCOUNTER — Ambulatory Visit: Payer: Medicaid Other | Admitting: Physician Assistant

## 2014-08-05 ENCOUNTER — Encounter: Payer: Self-pay | Admitting: Physician Assistant

## 2014-08-05 ENCOUNTER — Ambulatory Visit (INDEPENDENT_AMBULATORY_CARE_PROVIDER_SITE_OTHER): Payer: Medicaid Other | Admitting: Physician Assistant

## 2014-08-05 VITALS — BP 120/84 | HR 89 | Ht 66.0 in | Wt 313.0 lb

## 2014-08-05 DIAGNOSIS — R002 Palpitations: Secondary | ICD-10-CM

## 2014-08-05 DIAGNOSIS — C549 Malignant neoplasm of corpus uteri, unspecified: Secondary | ICD-10-CM

## 2014-08-05 DIAGNOSIS — I428 Other cardiomyopathies: Secondary | ICD-10-CM

## 2014-08-05 DIAGNOSIS — I429 Cardiomyopathy, unspecified: Secondary | ICD-10-CM

## 2014-08-05 DIAGNOSIS — C541 Malignant neoplasm of endometrium: Secondary | ICD-10-CM

## 2014-08-05 NOTE — Patient Instructions (Signed)
Your physician has requested that you have an echocardiogram. Echocardiography is a painless test that uses sound waves to create images of your heart. It provides your doctor with information about the size and shape of your heart and how well your heart's chambers and valves are working. This procedure takes approximately one hour. There are no restrictions for this procedure.  Your physician wants you to follow-up in: [place] [address] [place]. You will receive a reminder letter in the mail two months in advance. If you don't receive a letter, please call our office to schedule the follow-up appointment.   Your physician recommends that you continue on your current medications as directed. Please refer to the Current Medication list given to you today.

## 2014-08-05 NOTE — Progress Notes (Signed)
Cardiology Office Note    Date:  08/05/2014   ID:  Deborah [name], DOB 13-Dec-1965, MRN [id number]  PCP:  Tommi Rumps, MD  Cardiologist:  Dr. Minus Breeding      History of Present Illness: Deborah [name] is a 48 y.o. female with a history of chest pain, HL, endometrial CA status post TAH/BSO.  She had been seen in the past by Dr. Olevia Perches for chest pain. Adenosine Myoview (08/2009): EF 47%, scar with per-ischemia in the inferior wall. Cardiac catheterization was recommended but never performed.  She saw Dr. Percival Spanish in 11/2012 who arranged stress testing. Dobutamine echo (12/2012): Baseline EF 35%; no stress-induced ischemia (normalization of LV function with stress). Chest pain was felt to be atypical and no further ischemic evaluation recommended. She was placed on carvedilol for treatment of her cardiomyopathy.  Patient did not tolerate ACEI.  Last seen by Dr. Minus Breeding 3/15.  She returns for FU.  She is doing well. She denies chest pain, syncope. She denies significant dyspnea. She denies orthopnea, PND or edema. She does note occasional palpitations. These sometimes awaken her from sleep. They are infrequent.   Recent Labs/Images: 02/11/2014: ALT 14; Hemoglobin 13.1  06/02/2014: Creatinine 0.75; Potassium 3.7   Wt Readings from Last 3 Encounters:  08/05/14 313 lb (141.976 kg)  06/02/14 306 lb (138.801 kg)  05/11/14 302 lb 12.8 oz (137.349 kg)     Past Medical History  Diagnosis Date  . Congenital birth defect     Right hand  . Anemia   . Arthritis     knees  . Endometrial cancer   . Anxiety   . Swelling     ANKLES - TAKES LASIX  . Depression   . HLD (hyperlipidemia)     borderline  . Obesity   . Hx of radiation therapy 04/07/13- 05/14/13    pelvis 45 gray 25 fx    Current Outpatient Prescriptions  Medication Sig Dispense Refill  . aspirin 81 MG tablet Take 81 mg by mouth every morning.       . bifidobacterium infantis (ALIGN) capsule Take 1 capsule by mouth  daily as needed.      . carvedilol (COREG) 3.125 MG tablet Take 1 tablet in the am and 2 in the pm.  90 tablet  6  . cyclobenzaprine (FLEXERIL) 10 MG tablet Take 1 tablet (10 mg total) by mouth 3 (three) times daily as needed for muscle spasms.  30 tablet  0  . diazepam (VALIUM) 5 MG tablet Take 5 mg by mouth every 8 (eight) hours as needed for anxiety. Disp. 30 tablets. 0 refills.      . diclofenac (VOLTAREN) 75 MG EC tablet Take 1 tablet (75 mg total) by mouth 2 (two) times daily as needed.  60 tablet  1  . docusate sodium (COLACE) 50 MG capsule Take 50 mg by mouth 2 (two) times daily as needed.      . ferrous sulfate 325 (65 FE) MG tablet 325 mg every evening. Takes twice a week      . lactase (LACTAID) 3000 UNITS tablet Take 1 tablet by mouth 3 (three) times daily with meals as needed.      . loperamide (IMODIUM) 2 MG capsule Take 2 mg by mouth 4 (four) times daily as needed for diarrhea or loose stools.      [name] loratadine (CLARITIN) 10 MG tablet Take 1 tablet (10 mg total) by mouth daily.  30 tablet  0  .  magnesium chloride (SLOW-MAG) 64 MG TBEC SR tablet Take 2 tablets (128 mg total) by mouth daily.  60 tablet  2  . mometasone (NASONEX) 50 MCG/ACT nasal spray Place 2 sprays into the nose daily. One spray in each nostril.  17 g  0  . OVER THE COUNTER MEDICATION Allergy Plus Sinus Headache- acetaminophen 325, diphenhydramine 12.5, phenylephrine 5 mg      . OVER THE COUNTER MEDICATION Take 5 mg by mouth as needed (ALLERGY / SINUS). Medi-phenyl 5 mg      . polyethylene glycol powder (GLYCOLAX/MIRALAX) powder Take 17 g by mouth daily as needed.  3350 g  1  . potassium chloride (K-DUR) 10 MEQ tablet Take two tablets twice a day or as directed.  120 tablet  1  . sertraline (ZOLOFT) 100 MG tablet take 1 tablet by mouth once daily  30 tablet  1   No current facility-administered medications for this visit.     Allergies:   Review of patient's allergies indicates no known allergies.   Social  History:  The patient  reports that she has never smoked. She has never used smokeless tobacco. She reports that she drinks alcohol. She reports that she does not use illicit drugs.   Family History:  The patient's family history includes Cancer in her cousin, maternal aunt, and maternal grandfather; Colon cancer in her cousin; Colon polyps in her maternal aunt; Diabetes in her mother; Hypertension in her maternal grandfather and maternal grandmother; Lung cancer in her maternal aunt; Pancreatic cancer in her maternal grandfather; Stroke in her cousin. There is no history of Heart attack.   ROS:  Please see the history of present illness.      All other systems reviewed and negative.   PHYSICAL EXAM: VS:  BP 120/84  Pulse 89  Ht 5\' 6"  (1.676 m)  Wt 313 lb (141.976 kg)  BMI 50.54 kg/m2  LMP 09/20/2012 Well nourished, well developed, in no acute distress HEENT: normal Neck: no JVD Cardiac:  normal S1, S2; RRR; no murmur Lungs:  clear to auscultation bilaterally, no wheezing, rhonchi or rales Abd: soft, nontender, no hepatomegaly Ext: no edema Skin: warm and dry Neuro:  CNs 2-12 intact, no focal abnormalities noted  EKG:  NSR, HR 89, normal axis, no ST changes.      ASSESSMENT AND PLAN:  1. Cardiomyopathy:  Overall stable.  No evidence of volume excess. She was unable to tolerate a very low dose of ACEI.  Continue current dose of beta blocker.  Arrange FU echocardiogram to reassess LVF.  If this remains reduced, consider increasing beta blocker or a trial of an ARB. 2. Endometrial cancer:  She completed ChemoTx last year. She is in remission and remains ecstatic about this.  3. Palpitations:  Infrequent.  We discussed whether or not to arrange an event monitor.  Will hold off for now.  If she has recurrent or worsening symptoms, she will call and we will arrange an event monitor at that time.    Disposition:  FU with Dr. Minus Breeding in 6 mos.    Signed, Versie Starks,  MHS 08/05/2014 1:04 PM    [facility] [address], [place], [place]  [address] Phone: [phone]; Fax: [phone]

## 2014-08-07 IMAGING — CT CT ABD-PELV W/ CM
2 of 5 series · 17 of 46 positions shown, 19 images · IV contrast (OMNIPAQUE)
Comparison: None.

CLINICAL DATA: Endometrial carcinoma. Recently status post
hysterectomy.  Staging.

CT ABDOMEN AND PELVIS WITH CONTRAST
TECHNIQUE: Multidetector CT imaging of the abdomen and pelvis was
performed following the standard protocol during bolus
administration of intravenous contrast.
Contrast: 100mL OMNIPAQUE IOHEXOL 300 MG/ML  SOLN

[Series 2: rtn a/p with · axial · 0.74mm/px · z∈[+1259,+1689]mm · 14 of 96 slices shown, 16 images]
[im 5/96  soft-tissue]
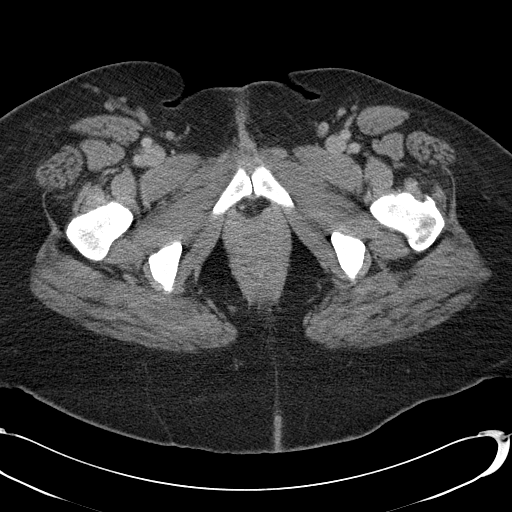
[im 5/96  bone]
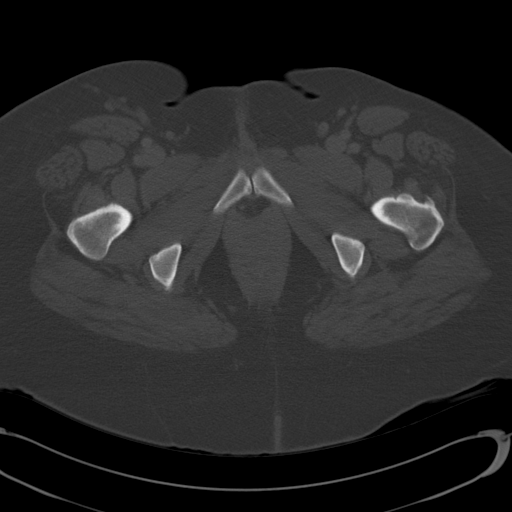
[im 15/96  soft-tissue]
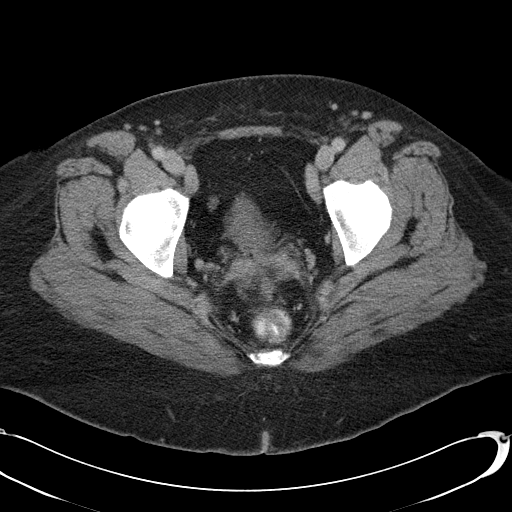
[im 20/96  soft-tissue]
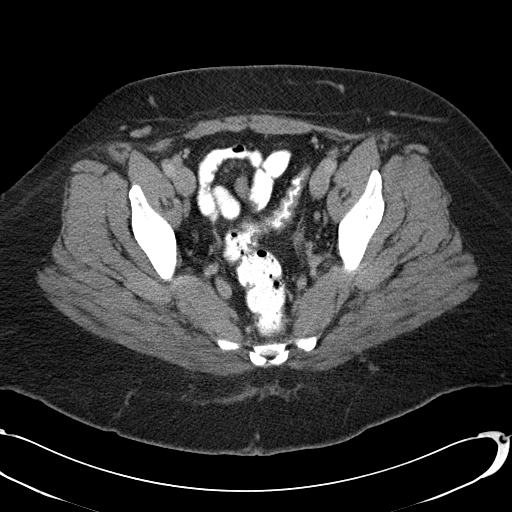
[im 24/96  soft-tissue]
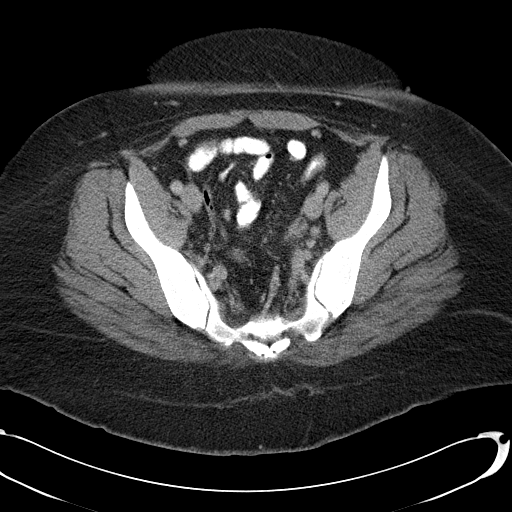
[im 34/96  soft-tissue]
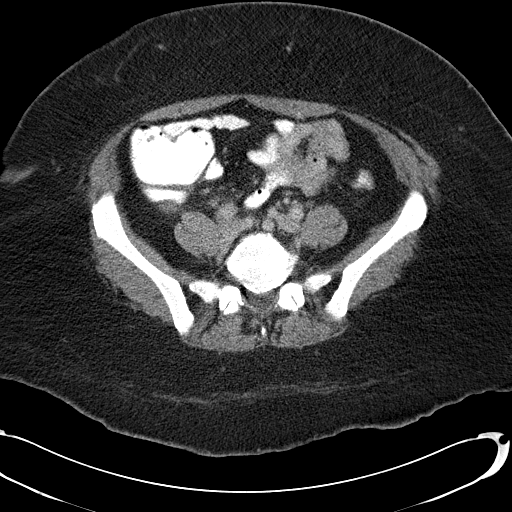
[im 39/96  soft-tissue]
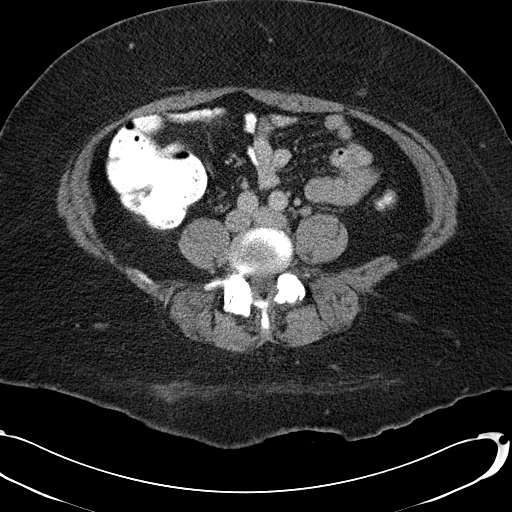
[im 43/96  soft-tissue]
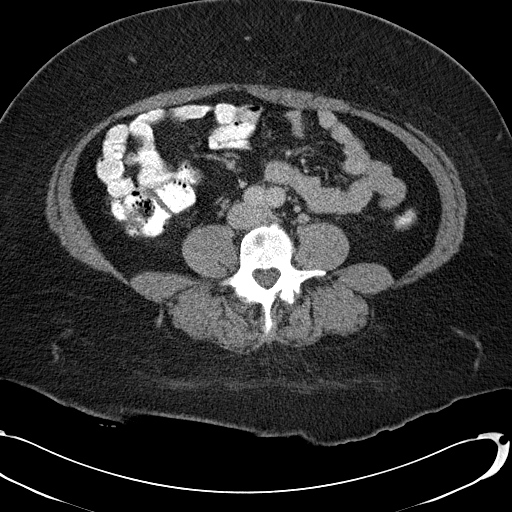
[im 53/96  soft-tissue]
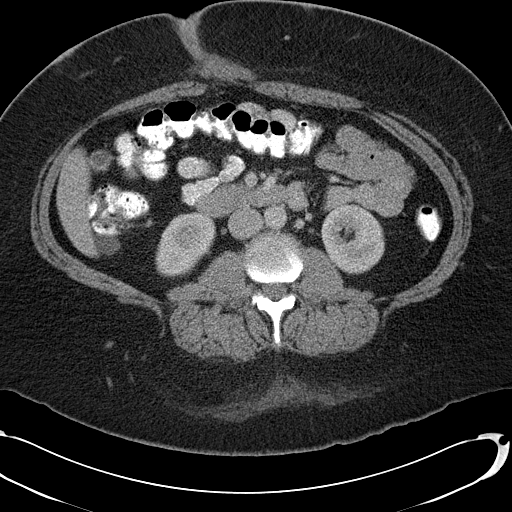
[im 58/96  soft-tissue]
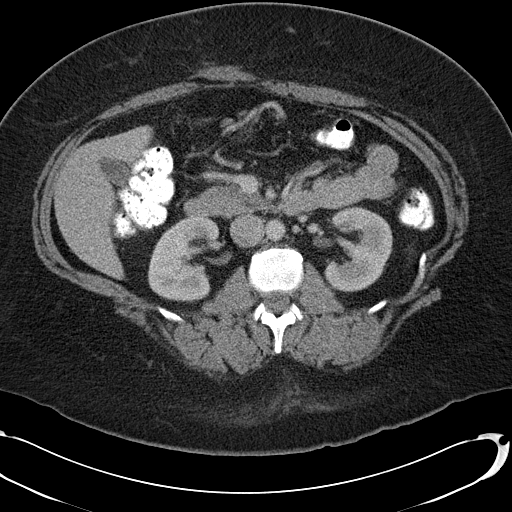
[im 58/96  bone]
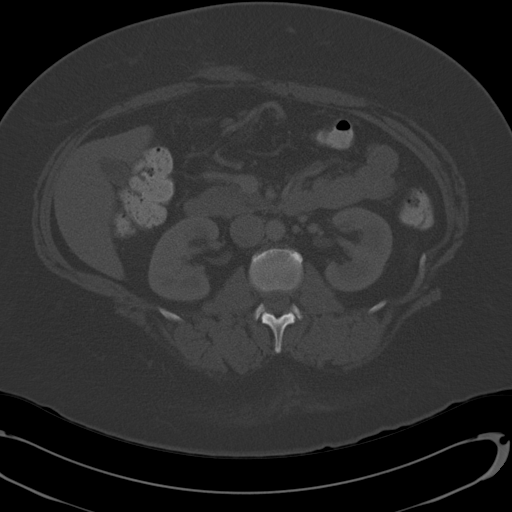
[im 62/96  soft-tissue]
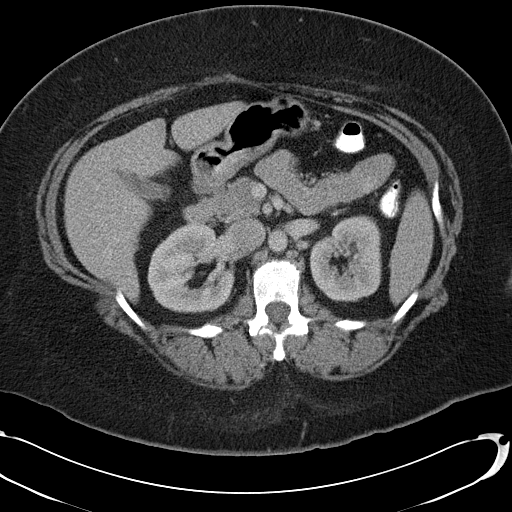
[im 72/96  soft-tissue]
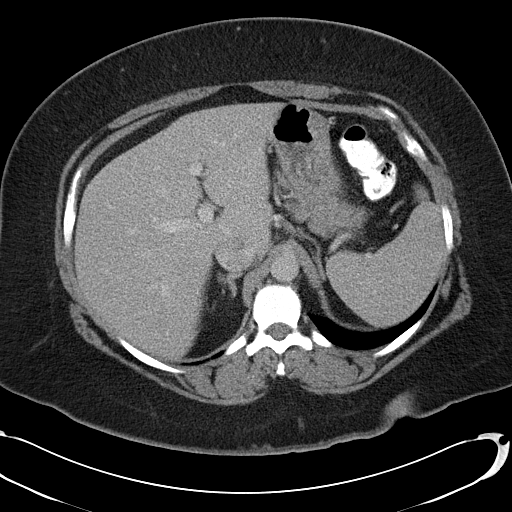
[im 77/96  soft-tissue]
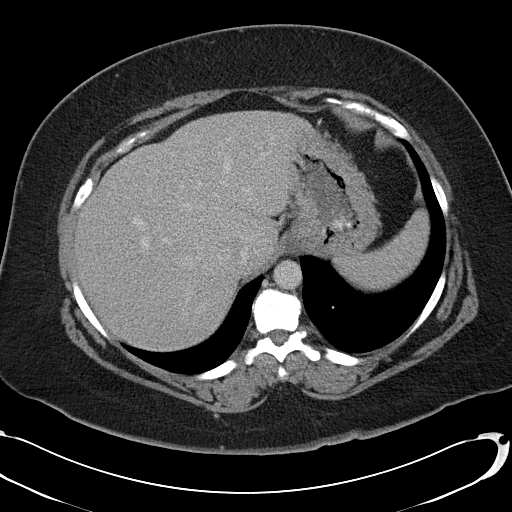
[im 81/96  soft-tissue]
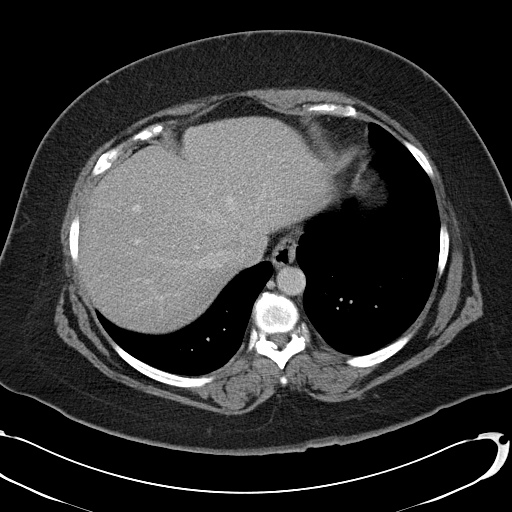
[im 91/96  soft-tissue]
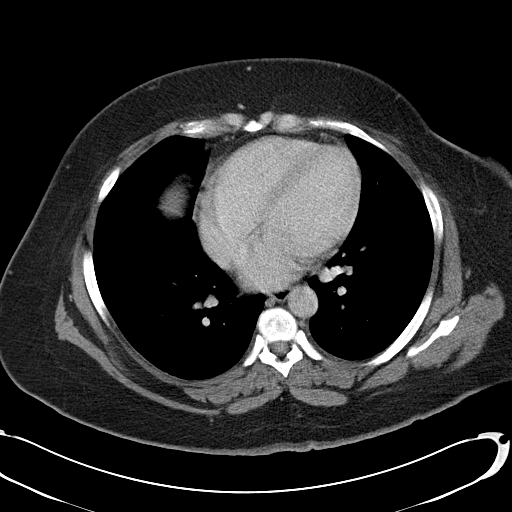

[Series 602: <mpr thick range> · coronal · 0.93mm/px · 3 of 92 slices shown]
[im 31/92  soft-tissue]
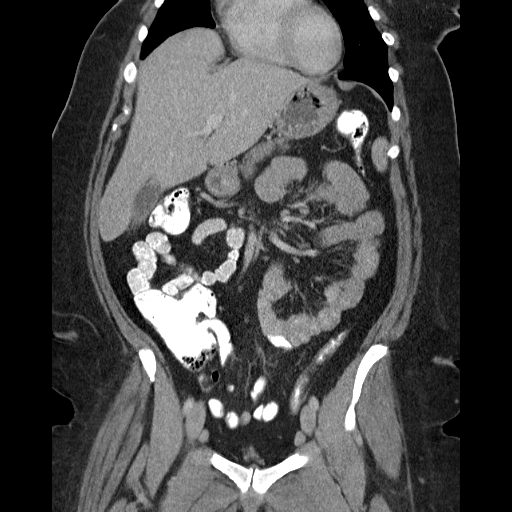
[im 41/92  soft-tissue]
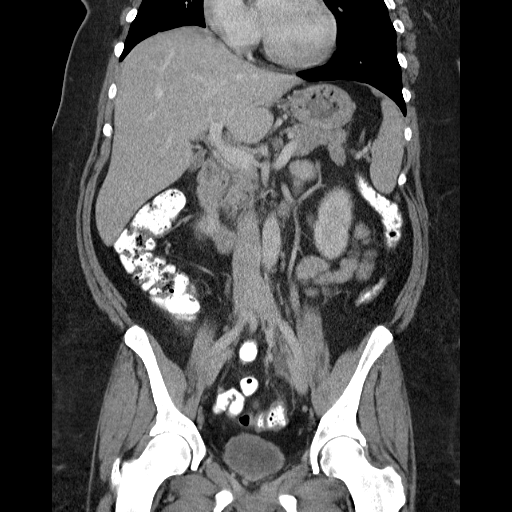
[im 51/92  soft-tissue]
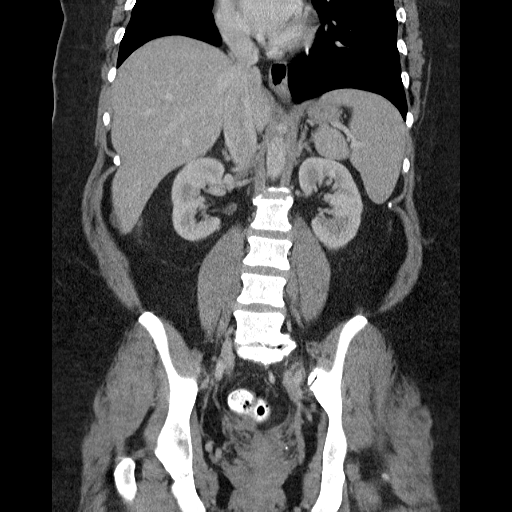

[17 of 46 positions shown; findings below may reference images not displayed]

FINDINGS: Postsurgical changes seen from hysterectomy. Shotty lymph
nodes are seen in the external iliac chains bilaterally, largest on
the left measuring 1.3 cm in short axis.  This is suspicious for
metastatic disease.  A fluid attenuation density is seen in the
proximal left external iliac lymph node chain which measures 1.9 x
2.7 cm.  This has the appearance of a postop fluid collection such
as a lymphocele, although low attenuation lymphadenopathy cannot be
excluded. Tiny less than 5 mm retroperitoneal lymph nodes are seen
in the left para-aortic region.  No other lymphadenopathy
identified.

No evidence of hydronephrosis.  A tiny nonobstructing 1 mm calculus
is seen in the lower pole of the right kidney.  The other abdominal
parenchymal organs are normal in appearance.  A tiny less than 1 cm
cholesterol gallstones seen, however there is no evidence of
cholecystitis.

No evidence of inflammatory process or abnormal fluid collections.
No evidence of bowel wall thickening, dilatation, or hernia.  No
suspicious bone lesions are identified.
IMPRESSION: 1.  Shotty bilateral external iliac lymphadenopathy measuring up to
1.3 cm, suspicious for metastatic disease.  Nonspecific less than 5
mm retroperitoneal lymph nodes also noted in the left para-aortic
region.
2.  2.7 cm postop lymphocele versus low attenuation lymphadenopathy
in the proximal left external iliac chain.
3.  Cholelithiasis and right nephrolithiasis incidentally noted.

## 2014-08-11 ENCOUNTER — Telehealth: Payer: Self-pay | Admitting: Oncology

## 2014-08-11 NOTE — Telephone Encounter (Signed)
Tahni called and left a message requesting a refill on diazepam (VALIUM) 5 MG tablet Sig - Route: Take 5 mg by mouth every 8 (eight) hours as needed for anxiety.  She last had it filled on 07/14/14.

## 2014-08-11 NOTE — Telephone Encounter (Signed)
Called in refill per Dr. Sondra Come to [facility] on [facility] for diazepam (VALIUM) 5 MG tablet 08/11/2014 Sig - Route: Take 5 mg by mouth every 8 (eight) hours as needed for anxiety. Disp. 30 tablets. 0 refills.    Left a message for [name] letting her know it has been called in.

## 2014-08-12 ENCOUNTER — Ambulatory Visit (HOSPITAL_COMMUNITY): Payer: Medicaid Other | Attending: Cardiology | Admitting: Radiology

## 2014-08-12 DIAGNOSIS — I428 Other cardiomyopathies: Secondary | ICD-10-CM | POA: Diagnosis not present

## 2014-08-12 DIAGNOSIS — R002 Palpitations: Secondary | ICD-10-CM | POA: Insufficient documentation

## 2014-08-12 DIAGNOSIS — I429 Cardiomyopathy, unspecified: Secondary | ICD-10-CM

## 2014-08-12 NOTE — Progress Notes (Signed)
Echocardiogram performed.  

## 2014-08-13 ENCOUNTER — Encounter: Payer: Self-pay | Admitting: Physician Assistant

## 2014-08-16 ENCOUNTER — Telehealth: Payer: Self-pay | Admitting: *Deleted

## 2014-08-16 NOTE — Telephone Encounter (Signed)
lmptcb for echo results 

## 2014-08-16 NOTE — Telephone Encounter (Signed)
pt notified about echo results with verbal understanding. Copy left at front desk front desk for her pick up.

## 2014-08-17 ENCOUNTER — Other Ambulatory Visit: Payer: Self-pay

## 2014-08-17 ENCOUNTER — Other Ambulatory Visit: Payer: Self-pay | Admitting: Oncology

## 2014-08-17 DIAGNOSIS — C541 Malignant neoplasm of endometrium: Secondary | ICD-10-CM

## 2014-08-18 ENCOUNTER — Encounter: Payer: Self-pay | Admitting: Oncology

## 2014-08-18 ENCOUNTER — Other Ambulatory Visit (HOSPITAL_BASED_OUTPATIENT_CLINIC_OR_DEPARTMENT_OTHER): Payer: Medicaid Other

## 2014-08-18 ENCOUNTER — Ambulatory Visit (HOSPITAL_BASED_OUTPATIENT_CLINIC_OR_DEPARTMENT_OTHER): Payer: Medicaid Other | Admitting: Oncology

## 2014-08-18 VITALS — BP 126/83 | HR 97 | Temp 98.0°F | Resp 19 | Ht 66.0 in | Wt 317.8 lb

## 2014-08-18 DIAGNOSIS — C541 Malignant neoplasm of endometrium: Secondary | ICD-10-CM

## 2014-08-18 DIAGNOSIS — C549 Malignant neoplasm of corpus uteri, unspecified: Secondary | ICD-10-CM

## 2014-08-18 LAB — CBC WITH DIFFERENTIAL/PLATELET
BASO%: 0.3 % (ref 0.0–2.0)
Basophils Absolute: 0 10*3/uL (ref 0.0–0.1)
EOS ABS: 0.1 10*3/uL (ref 0.0–0.5)
EOS%: 3 % (ref 0.0–7.0)
HCT: 43.2 % (ref 34.8–46.6)
HEMOGLOBIN: 14.1 g/dL (ref 11.6–15.9)
LYMPH%: 17.6 % (ref 14.0–49.7)
MCH: 30.5 pg (ref 25.1–34.0)
MCHC: 32.6 g/dL (ref 31.5–36.0)
MCV: 93.6 fL (ref 79.5–101.0)
MONO#: 0.4 10*3/uL (ref 0.1–0.9)
MONO%: 8.8 % (ref 0.0–14.0)
NEUT%: 70.3 % (ref 38.4–76.8)
NEUTROS ABS: 3.3 10*3/uL (ref 1.5–6.5)
PLATELETS: 277 10*3/uL (ref 145–400)
RBC: 4.61 10*6/uL (ref 3.70–5.45)
RDW: 15 % — ABNORMAL HIGH (ref 11.2–14.5)
WBC: 4.6 10*3/uL (ref 3.9–10.3)
lymph#: 0.8 10*3/uL — ABNORMAL LOW (ref 0.9–3.3)

## 2014-08-18 LAB — COMPREHENSIVE METABOLIC PANEL (CC13)
ALT: 12 U/L (ref 0–55)
ANION GAP: 10 meq/L (ref 3–11)
AST: 11 U/L (ref 5–34)
Albumin: 3.3 g/dL — ABNORMAL LOW (ref 3.5–5.0)
Alkaline Phosphatase: 82 U/L (ref 40–150)
BUN: 16.1 mg/dL (ref 7.0–26.0)
CO2: 30 meq/L — AB (ref 22–29)
Calcium: 9.5 mg/dL (ref 8.4–10.4)
Chloride: 101 mEq/L (ref 98–109)
Creatinine: 0.9 mg/dL (ref 0.6–1.1)
Glucose: 107 mg/dl (ref 70–140)
Potassium: 3.4 mEq/L — ABNORMAL LOW (ref 3.5–5.1)
SODIUM: 141 meq/L (ref 136–145)
TOTAL PROTEIN: 8 g/dL (ref 6.4–8.3)
Total Bilirubin: 0.38 mg/dL (ref 0.20–1.20)

## 2014-08-18 NOTE — Progress Notes (Signed)
OFFICE PROGRESS NOTE   08/18/2014   Physicians:W.Brewster, J.Kinard, M.Georgeanna Lea (PCP [facility]) J.Hochrein   INTERVAL HISTORY:  Patient is seen, alone for visit, in scheduled follow up of history of IIIC endometrial carcinoma, on observation since treatment completed 07-24-2013. She is to see Dr Sondra Come in October and Dr Skeet Latch in Dec. Last CT AP was 07-2013.  Patient has no complaints that seem concerning from standpoint of the gyn cancer or that treatment. She had gained 22 lbs since I saw her in March, which we have discussed now. She is followed by [facility], who are managing K and Mg,  and by cardiology.   She does not have PAC. She declines flu vaccine here. Mammograms + [place] are scheduled at [facility] 08-25-14, this 6 mo follow up of likely fibroadenomas on imaging 01-2014.  ONCOLOGIC HISTORY Patient presented with spotting and menorrhagia, last PAP 2 years prior. Biopsies by Dr Clovia Cuff 09-22-2012 (path [id number]) showed endometroid adenocarcinoma. She had robotic hysterectomy with BSO by Dr Skeet Latch on 10-07-12 (no lymph nodes sampled as serosal cysts appeared malignant). Pathology [phone]) showed grade 2 endometroid adenocarcinoma with involvement of < 1/2 of myometrium, no LVSI, involvement of lower uterine segment, ovaries and tubes benign and the serosal cysts benign. CT AP 11-25-12 showed shotty bilateral external iliac nodes up to 1.3 cm and nonspecific < 5 mm left para-aortic nodes. She saw Dr Skeet Latch following the CT, with recommendation for chemotherapy and radiation in sandwich fashion. Taxol/carboplatin was begun 01-07-13, with 3 cycles thru 02-18-13 with gCSF support, then IMRT 45 cGy to pelvis by Dr Sondra Come from 5-13 thru 05-14-13. She missed appointments, then resumed chemotherapy with cycle 4 06-11-13 and completed cycle 6 on 07-24-13, with neulasta.  Review of systems as above, also: No fever or symptoms of infection. Bowels and blader unchanged. No  swelling LE. No bleeding. No SOB or other respiratory symptoms. Energy and appetite good. No noted changes on breast self exam Remainder of 10 point Review of Systems negative.  Objective:  Vital signs in last 24 hours:  BP 126/83  Pulse 97  Temp(Src) 98 F (36.7 C) (Oral)  Resp 19  Ht 5\' 6"  (1.676 m)  Wt 317 lb 12.8 oz (144.153 kg)  BMI 51.32 kg/m2  LMP 09/20/2012 Weight had been 292 in March.   Alert, oriented and appropriate. Ambulatory without assistance.    HEENT:PERRL, sclerae not icteric. Oral mucosa moist without lesions, posterior pharynx clear.  Neck supple. No JVD.  Lymphatics:no cervical,supraclavicular, axillary or appreciable inguinal adenopathy Resp: clear to auscultation bilaterally and normal percussion bilaterally Cardio: regular rate and rhythm. No gallop. GI: abdomen obese, soft, nontender, not obviously distended, no appreciable mass or organomegaly. Normally active bowel sounds. Surgical incisions not remarkable. Musculoskeletal/ Extremities: without pitting edema, cords, tenderness. Congenital deformity RUE. Neuro: no peripheral neuropathy. Otherwise nonfocal. PSYCH appropriate mood and affect Skin without rash, ecchymosis, petechiae   Lab Results:  Results for orders placed in visit on 08/18/14  CBC WITH DIFFERENTIAL      Result Value Ref Range   WBC 4.6  3.9 - 10.3 10e3/uL   NEUT# 3.3  1.5 - 6.5 10e3/uL   HGB 14.1  11.6 - 15.9 g/dL   HCT 43.2  34.8 - 46.6 %   Platelets 277  145 - 400 10e3/uL   MCV 93.6  79.5 - 101.0 fL   MCH 30.5  25.1 - 34.0 pg   MCHC 32.6  31.5 - 36.0 g/dL   RBC 4.61  3.70 - 5.45 10e6/uL   RDW 15.0 (*) 11.2 - 14.5 %   lymph# 0.8 (*) 0.9 - 3.3 10e3/uL   MONO# 0.4  0.1 - 0.9 10e3/uL   Eosinophils Absolute 0.1  0.0 - 0.5 10e3/uL   Basophils Absolute 0.0  0.0 - 0.1 10e3/uL   NEUT% 70.3  38.4 - 76.8 %   LYMPH% 17.6  14.0 - 49.7 %   MONO% 8.8  0.0 - 14.0 %   EOS% 3.0  0.0 - 7.0 %   BASO% 0.3  0.0 - 2.0 %  COMPREHENSIVE  METABOLIC PANEL (NW29)      Result Value Ref Range   Sodium 141  136 - 145 mEq/L   Potassium 3.4 (*) 3.5 - 5.1 mEq/L   Chloride 101  98 - 109 mEq/L   CO2 30 (*) 22 - 29 mEq/L   Glucose 107  70 - 140 mg/dl   BUN 16.1  7.0 - 26.0 mg/dL   Creatinine 0.9  0.6 - 1.1 mg/dL   Total Bilirubin 0.38  0.20 - 1.20 mg/dL   Alkaline Phosphatase 82  40 - 150 U/L   AST 11  5 - 34 U/L   ALT 12  0 - 55 U/L   Total Protein 8.0  6.4 - 8.3 g/dL   Albumin 3.3 (*) 3.5 - 5.0 g/dL   Calcium 9.5  8.4 - 10.4 mg/dL   Anion Gap 10  3 - 11 mEq/L     Studies/Results:  No results found.  Medications: I have reviewed the patient's current medications.  DISCUSSION: need to lose weight to ideal, which I have emphasized is felt beneficial long term for gyn cancers. S  Assessment/Plan:  1.IIIC endometrial carcinoma: history as above, clinically no concerns. She will keep appointments as scheduled, follow up here 6 mo + lab. 2. Degenerative arthritis with chronic low back pain and knee pain in setting of morbid obesity: as above.  3.cardiac global hypokinesis by imaging reports 4.morbid obesity: ongoing education and encouragement for weight loss. 5. Hypomagnesemia and hypokalemia: not related to previous chemo, on supplements and followed by PCP  6. Follow up US and mammograms upcoming 7.refused flu vaccine   All questions answered and she understands recommendations. Time spent 15 min including >50% counseling and coordination of care.    Gordy Levan, MD   08/18/2014, 2:34 PM

## 2014-08-19 ENCOUNTER — Other Ambulatory Visit: Payer: Self-pay

## 2014-08-19 MED ORDER — CARVEDILOL 3.125 MG PO TABS: ORAL_TABLET | ORAL | Status: DC

## 2014-08-25 ENCOUNTER — Other Ambulatory Visit: Payer: Medicaid Other

## 2014-08-27 ENCOUNTER — Ambulatory Visit
Admission: RE | Admit: 2014-08-27 | Discharge: 2014-08-27 | Disposition: A | Discharge status: Home / Self Care | Payer: Medicaid Other | Source: Ambulatory Visit | Attending: Family Medicine | Admitting: Family Medicine

## 2014-08-27 DIAGNOSIS — D241 Benign neoplasm of right breast: Secondary | ICD-10-CM

## 2014-08-27 DIAGNOSIS — D242 Benign neoplasm of left breast: Principal | ICD-10-CM

## 2014-09-10 ENCOUNTER — Telehealth: Payer: Self-pay | Admitting: Oncology

## 2014-09-10 NOTE — Telephone Encounter (Signed)
Deborah [name] called requesting a refill on diazepam.  She would like it called in to [facility] on [address].  She last had it filled on 08/11/14.  Advised her that Dr. Sondra Come is out of the office today and that I will call her back on Monday afternoon when he returns.  Deborah [name] verbalized agreement.

## 2014-09-11 ENCOUNTER — Telehealth: Payer: Self-pay | Admitting: Oncology

## 2014-09-11 NOTE — Telephone Encounter (Signed)
, °

## 2014-09-13 ENCOUNTER — Ambulatory Visit: Payer: Medicaid Other | Admitting: Radiation Oncology

## 2014-09-13 ENCOUNTER — Telehealth: Payer: Self-pay | Admitting: Oncology

## 2014-09-13 NOTE — Telephone Encounter (Signed)
Called in refill to [facility] on [address] per Dr. Sondra Come for diazepam (VALIUM) 5 MG tablet. Take 5 mg by mouth every 8 (eight) hours as needed for anxiety. Disp. 30 tablets. 0 refills. Called [facility] and left a message letting her know that it had been called in and reminding her to make sure she keeps her follow up appointment on 09/23/14.

## 2014-09-16 ENCOUNTER — Ambulatory Visit: Payer: Medicaid Other | Admitting: Radiation Oncology

## 2014-09-23 ENCOUNTER — Ambulatory Visit
Admission: RE | Admit: 2014-09-23 | Discharge: 2014-09-23 | Disposition: A | Discharge status: Home / Self Care | Payer: Medicaid Other | Source: Ambulatory Visit | Attending: Radiation Oncology | Admitting: Radiation Oncology

## 2014-09-23 ENCOUNTER — Telehealth: Payer: Self-pay | Admitting: *Deleted

## 2014-09-23 ENCOUNTER — Other Ambulatory Visit (HOSPITAL_COMMUNITY)
Admission: RE | Admit: 2014-09-23 | Discharge: 2014-09-23 | Disposition: A | Discharge status: Home / Self Care | Payer: Medicaid Other | Source: Ambulatory Visit | Attending: Radiation Oncology | Admitting: Radiation Oncology

## 2014-09-23 ENCOUNTER — Encounter: Payer: Self-pay | Admitting: Radiation Oncology

## 2014-09-23 VITALS — BP 119/78 | HR 94 | Temp 98.2°F | Resp 20 | Wt 319.2 lb

## 2014-09-23 DIAGNOSIS — C541 Malignant neoplasm of endometrium: Secondary | ICD-10-CM

## 2014-09-23 DIAGNOSIS — Z01411 Encounter for gynecological examination (general) (routine) with abnormal findings: Secondary | ICD-10-CM | POA: Diagnosis not present

## 2014-09-23 NOTE — Telephone Encounter (Signed)
CALLED PATIENT TO INFORM OF FU WITH DR. Skeet Latch ON 01-06-15 - ARRIVAL TIME - 11:15 AM , SPOKE WITH PATIENT AND SHE IS AWARE OF THIS APPT.

## 2014-09-23 NOTE — Progress Notes (Signed)
Patient denies pain, vaginal discharge/bleeding, bladder/bowel issues, fatigue, loss of appetite. She is using her vaginal dilator 2-3 x weekly, and she states it "causes pelvic soreness".

## 2014-09-23 NOTE — Progress Notes (Signed)
Radiation Oncology         [phone] ________________________________  Name: Deborah [name] MRN: [id number]  Date: 09/23/2014  DOB: Jan 31, 1966  Follow-Up Visit Note  CC: Tommi Rumps, MD  Gordy Levan, MD    ICD-9-CM ICD-10-CM   1. Endometrial cancer 182.0 C54.1 Cytology - PAP    Diagnosis:   Stage IIIC grade 2 endometrial adenocarcinoma  Interval Since Last Radiation:  One year and 4 months, the patient completed postoperative pelvic radiation therapy to 45 gray  Narrative:  The patient returns today for routine follow-up.  She denies any new medical problems. She continues to followup with medical oncology and gynecologic oncology,  patient denies any abdominal cramping or problems with diarrhea. She denies any vaginal bleeding hematuria or urinary difficulties. She denies any rectal bleeding or problems with diarrhea. Patient does have some soreness after she uses her vaginal dilator but no bleeding.                              ALLERGIES:  has No Known Allergies.  Meds: Current Outpatient Prescriptions  Medication Sig Dispense Refill  . aspirin 81 MG tablet Take 81 mg by mouth every morning.       . bifidobacterium infantis (ALIGN) capsule Take 1 capsule by mouth daily as needed.      . carvedilol (COREG) 3.125 MG tablet Take 1 tablet in the am and 2 in the pm.  90 tablet  6  . cyclobenzaprine (FLEXERIL) 10 MG tablet Take 1 tablet (10 mg total) by mouth 3 (three) times daily as needed for muscle spasms.  30 tablet  0  . diazepam (VALIUM) 5 MG tablet Take 5 mg by mouth every 8 (eight) hours as needed for anxiety. Disp. 30 tablets. 0 refills.  Called in to [facility] on [facility] per Dr. Sondra Come.      . docusate sodium (COLACE) 50 MG capsule Take 50 mg by mouth 2 (two) times daily as needed.      . ferrous sulfate 325 (65 FE) MG tablet 325 mg every evening. Takes twice a week      . lactase (LACTAID) 3000 UNITS tablet Take 1 tablet by mouth 3 (three) times daily  with meals as needed.      . loperamide (IMODIUM) 2 MG capsule Take 2 mg by mouth 4 (four) times daily as needed for diarrhea or loose stools.      [name] loratadine (CLARITIN) 10 MG tablet Take 1 tablet (10 mg total) by mouth daily.  30 tablet  0  . magnesium chloride (SLOW-MAG) 64 MG TBEC SR tablet Take 2 tablets by mouth 2 (two) times daily.      . mometasone (NASONEX) 50 MCG/ACT nasal spray Place 2 sprays into the nose daily. One spray in each nostril.  17 g  0  . OVER THE COUNTER MEDICATION Allergy Plus Sinus Headache- acetaminophen 325, diphenhydramine 12.5, phenylephrine 5 mg      . OVER THE COUNTER MEDICATION Take 5 mg by mouth as needed (ALLERGY / SINUS). Medi-phenyl 5 mg      . polyethylene glycol powder (GLYCOLAX/MIRALAX) powder Take 17 g by mouth daily as needed.  3350 g  1  . potassium chloride (K-DUR) 10 MEQ tablet Take two tablets twice a day or as directed.  120 tablet  1  . sertraline (ZOLOFT) 100 MG tablet take 1 tablet by mouth once daily  30 tablet  1   No current facility-administered medications for this encounter.    Physical Findings: The patient is in no acute distress. Patient is alert and oriented.  weight is 319 lb 3.2 oz (144.788 kg). Her oral temperature is 98.2 F (36.8 C). Her blood pressure is 119/78 and her pulse is 94. Her respiration is 20. [name]  No palpable supraclavicular or axillary adenopathy. Lungs are clear to auscultation. The heart has regular rhythm and rate. The abdomen is soft and nontender with normal bowel sounds. On pelvic examination the external genitalia are unremarkable. A speculum exam is performed. There are no mucosal lesions noted the vaginal vault. A Pap smear was obtained of the proximal vagina. On bimanual examination there no pelvic masses appreciated although exam is compromised in light of the patient's body habitus. 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 Patient has a history of endometrial cancer.  EXAM: DIGITAL DIAGNOSTIC  BILATERAL MAMMOGRAM WITH CAD  COMPARISON:  01/23/2013, 02/17/2013, 08/20/2013  ACR Breast Density Category a: The breast tissue is almost entirely fatty.  FINDINGS: Oval circumscribed mass in the right breast (6 o'clock to 7 o'clock region) is stable mammographically. The margins of the mass are circumscribed. The mass is well-visualized due to the patient's by background of fatty breast parenchyma. This mass is unchanged dating back to a mammogram of the 01/23/2013.  Similar-appearing circumscribed gently lobulated mass in the 8 o'clock region of the left breast is stable. This is unchanged dating back to the mammogram of 01/23/2013.  No new masses are identified in either breast. No suspicious microcalcifications or distortion is seen bilaterally.  Mammographic images were processed with CAD.  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 These are well visualized mammographically and have previously been evaluated with ultrasound. No new or suspicious findings in either breast.  RECOMMENDATION: Bilateral diagnostic mammogram is recommended in February 2016 to complete a full two year follow-up  I have discussed the findings and recommendations with the patient. Results were also provided in writing at the conclusion of the visit. If applicable, a reminder letter will be sent to the patient regarding the next appointment.  BI-RADS CATEGORY  3: Probably benign.   Electronically Signed   By: Curlene Dolphin M.D.   On: 08/27/2014 14:36    Impression:  No evidence of recurrence  on clinical exam today, Pap smear pending  Plan:  Routine followup in 6 months. The patient will be seen by gynecologic oncology in the interim.  ____________________________________ Blair Promise, MD

## 2014-09-24 LAB — CYTOLOGY - PAP

## 2014-09-27 ENCOUNTER — Telehealth: Payer: Self-pay | Admitting: Oncology

## 2014-09-27 ENCOUNTER — Encounter: Payer: Self-pay | Admitting: Radiation Oncology

## 2014-09-27 NOTE — Telephone Encounter (Signed)
Left a message requesting a call back regarding Deborah [name]'s test results.

## 2014-09-27 NOTE — Telephone Encounter (Signed)
Omelia called back and said she received the message regarding her pap smear results.  She is requesting a copy of her pap smear and would like to pick it up tomorrow.

## 2014-09-28 ENCOUNTER — Other Ambulatory Visit: Payer: Self-pay | Admitting: Family Medicine

## 2014-09-28 DIAGNOSIS — C541 Malignant neoplasm of endometrium: Secondary | ICD-10-CM

## 2014-09-28 MED ORDER — POTASSIUM CHLORIDE ER 10 MEQ PO TBCR: EXTENDED_RELEASE_TABLET | ORAL | Status: DC

## 2014-09-28 NOTE — Telephone Encounter (Signed)
Needs refill on potassium

## 2014-09-29 ENCOUNTER — Emergency Department (HOSPITAL_COMMUNITY)
Admission: EM | Admit: 2014-09-29 | Discharge: 2014-09-29 | Disposition: A | Discharge status: Home / Self Care | Payer: Medicaid Other | Attending: Emergency Medicine | Admitting: Emergency Medicine

## 2014-09-29 ENCOUNTER — Emergency Department (HOSPITAL_COMMUNITY): Payer: Medicaid Other

## 2014-09-29 ENCOUNTER — Ambulatory Visit: Payer: Medicaid Other | Admitting: Family Medicine

## 2014-09-29 ENCOUNTER — Encounter (HOSPITAL_COMMUNITY): Payer: Self-pay | Admitting: *Deleted

## 2014-09-29 DIAGNOSIS — F329 Major depressive disorder, single episode, unspecified: Secondary | ICD-10-CM | POA: Diagnosis not present

## 2014-09-29 DIAGNOSIS — Z7951 Long term (current) use of inhaled steroids: Secondary | ICD-10-CM | POA: Diagnosis not present

## 2014-09-29 DIAGNOSIS — M546 Pain in thoracic spine: Secondary | ICD-10-CM | POA: Diagnosis not present

## 2014-09-29 DIAGNOSIS — Z7982 Long term (current) use of aspirin: Secondary | ICD-10-CM | POA: Insufficient documentation

## 2014-09-29 DIAGNOSIS — E669 Obesity, unspecified: Secondary | ICD-10-CM | POA: Diagnosis not present

## 2014-09-29 DIAGNOSIS — J918 Pleural effusion in other conditions classified elsewhere: Secondary | ICD-10-CM | POA: Insufficient documentation

## 2014-09-29 DIAGNOSIS — M25511 Pain in right shoulder: Secondary | ICD-10-CM | POA: Diagnosis present

## 2014-09-29 DIAGNOSIS — Z8542 Personal history of malignant neoplasm of other parts of uterus: Secondary | ICD-10-CM | POA: Insufficient documentation

## 2014-09-29 DIAGNOSIS — Z923 Personal history of irradiation: Secondary | ICD-10-CM | POA: Insufficient documentation

## 2014-09-29 DIAGNOSIS — J90 Pleural effusion, not elsewhere classified: Secondary | ICD-10-CM

## 2014-09-29 DIAGNOSIS — R079 Chest pain, unspecified: Secondary | ICD-10-CM | POA: Insufficient documentation

## 2014-09-29 DIAGNOSIS — Z79899 Other long term (current) drug therapy: Secondary | ICD-10-CM | POA: Insufficient documentation

## 2014-09-29 DIAGNOSIS — F419 Anxiety disorder, unspecified: Secondary | ICD-10-CM | POA: Diagnosis not present

## 2014-09-29 DIAGNOSIS — M1990 Unspecified osteoarthritis, unspecified site: Secondary | ICD-10-CM | POA: Insufficient documentation

## 2014-09-29 DIAGNOSIS — D649 Anemia, unspecified: Secondary | ICD-10-CM | POA: Insufficient documentation

## 2014-09-29 LAB — CBC
HEMATOCRIT: 43.1 % (ref 36.0–46.0)
HEMOGLOBIN: 14.2 g/dL (ref 12.0–15.0)
MCH: 30.8 pg (ref 26.0–34.0)
MCHC: 32.9 g/dL (ref 30.0–36.0)
MCV: 93.5 fL (ref 78.0–100.0)
Platelets: 261 10*3/uL (ref 150–400)
RBC: 4.61 MIL/uL (ref 3.87–5.11)
RDW: 13.5 % (ref 11.5–15.5)
WBC: 5.6 10*3/uL (ref 4.0–10.5)

## 2014-09-29 LAB — BASIC METABOLIC PANEL
Anion gap: 13 (ref 5–15)
BUN: 17 mg/dL (ref 6–23)
CO2: 27 mEq/L (ref 19–32)
Calcium: 9.8 mg/dL (ref 8.4–10.5)
Chloride: 101 mEq/L (ref 96–112)
Creatinine, Ser: 0.83 mg/dL (ref 0.50–1.10)
GFR calc Af Amer: >90 mL/min (ref >90)
GFR calc non Af Amer: 83 mL/min — ABNORMAL LOW (ref >90)
GLUCOSE: 93 mg/dL (ref 70–99)
Potassium: 4.4 mEq/L (ref 3.7–5.3)
Sodium: 141 mEq/L (ref 137–147)

## 2014-09-29 LAB — TROPONIN I: Troponin I: <0.3 ng/mL (ref <0.30)

## 2014-09-29 LAB — PRO B NATRIURETIC PEPTIDE: Pro B Natriuretic peptide (BNP): 99.3 pg/mL (ref 0–125)

## 2014-09-29 MED ORDER — CYCLOBENZAPRINE HCL 10 MG PO TABS
10.0000 mg | ORAL_TABLET | Freq: Once | ORAL | Status: AC
  Administered 2014-09-29: 10 mg via ORAL
  Filled 2014-09-29: qty 1

## 2014-09-29 MED ORDER — CYCLOBENZAPRINE HCL 10 MG PO TABS: 10.0000 mg | ORAL_TABLET | Freq: Three times a day (TID) | ORAL | Status: DC | PRN

## 2014-09-29 MED ORDER — IBUPROFEN 800 MG PO TABS
800.0000 mg | ORAL_TABLET | Freq: Once | ORAL | Status: AC
  Administered 2014-09-29: 800 mg via ORAL
  Filled 2014-09-29: qty 1

## 2014-09-29 MED ORDER — IBUPROFEN 800 MG PO TABS: 800.0000 mg | ORAL_TABLET | Freq: Three times a day (TID) | ORAL | Status: DC | PRN

## 2014-09-29 MED ORDER — HYDROCODONE-ACETAMINOPHEN 5-325 MG PO TABS: 1.0000 | ORAL_TABLET | Freq: Four times a day (QID) | ORAL | Status: DC | PRN

## 2014-09-29 NOTE — ED Provider Notes (Signed)
MSE was initiated and I personally evaluated the patient and placed orders (if any) at  2:38 PM on September 29, 2014.  Patient presenting with right scapular pain for the past 2 days without any known injury or trauma. She reports the pain "takes her breath away". Admits to some right-sided chest discomfort that I I am unable to re-create on my exam. I feel patient should have a further cardiac workup rather than just a shoulder x-ray. Labs pending. EKG, CXR. Pt to be moved to acute side of the ED.  The patient appears stable so that the remainder of the MSE may be completed by another provider.  Carman Ching, PA-C 09/29/14 1441  Merryl Hacker, MD 09/30/14 [phone]

## 2014-09-29 NOTE — Discharge Instructions (Signed)
Pleural Effusion °The lining covering your lungs and the inside of your chest is called the pleura. Usually, the space between the two pleura contains no air and only a thin layer of fluid. A pleural effusion is an abnormal buildup of fluid in the pleural space. °Fluid gathers when there is increased pressure in the lung vessels. This forces fluids out of the lungs and into the pleural space. Vessels may also leak fluids when there are infections, such as pneumonia, or other causes of soreness and redness (inflammation). Fluids leak into the lungs when protein in the blood is low or when certain vessels (lymphatics) are blocked. °Finding a pleural effusion is important because it is usually caused by another disease. In order to treat a pleural effusion, your health care provider needs to find its cause. If left untreated, a large amount of fluid can build up and cause collapse of the lung. °CAUSES  °· Heart failure. °· Infections (pneumonia, tuberculosis), pulmonary embolism, pulmonary infarction. °· Cancer (primary lung and metastatic), asbestosis. °· Liver failure (cirrhosis). °· Nephrotic syndrome, peritoneal dialysis, kidney problems (uremia). °· Collagen vascular disease (systemic lupus erythematosus, rheumatoid arthritis). °· Injury (trauma) to the chest or rupture of the digestive tube (esophagus). °· Material in the chest or pleural space (hemothorax, chylothorax). °· Pancreatitis. °· Surgery. °· Drug reactions. °SYMPTOMS  °A pleural effusion can decrease the amount of space available for breathing and make you short of breath. The fluid can become infected, which may cause pain and fever. Often, the pain is worse when taking a deep breath. The underlying disease (heart failure, pneumonia, blood clot, tuberculosis, cancer) may also cause symptoms. °DIAGNOSIS  °· Your health care provider can usually tell what is wrong by talking to you (taking a history), doing an exam, and taking a routine X-ray. If the  X-ray shows fluid in your chest, often fluid is removed from your chest with a needle for testing (diagnostic thoracentesis). °· Sometimes, more specialized X-rays may be needed. °· Sometimes, a small piece of tissue is removed and examined by a specialist (biopsy). °TREATMENT  °Treatment varies based on what caused the pleural effusion. Treatments include: °· Removing as much fluid as possible using a needle (thoracentesis) to improve the cough and shortness of breath. This is a simple procedure that can be done at bedside. The risks are bleeding, infection, collapse of a lung, or low blood pressure. °· Placing a tube in the chest to drain the effusion (tube thoracostomy). This is often used when there is an infection in the fluid. This is a simple procedure that can often be done at bedside or in a clinic. The procedure may be painful. The risks are the same as using a needle to drain the fluid. The chest tube usually remains for a few days and is connected to suction to improve fluid drainage. After placement, the tube usually does not cause much discomfort. °· Surgical removal of fibrous debris in and around the pleural space (decortication). This may be done with a flexible telescope (thoracoscope) through a small or large cut (incision). This is helpful for patients who have fibrosis or scar tissue that prevents complete lung expansion. The risks are infection, blood loss, and side effects from general anesthesia. °· Sometimes, a procedure called pleurodesis is done. A chest tube is placed and the fluid is drained. Next, an agent (tetracycline, talc powder) is added to the pleural space. This causes the lung and chest wall to stick together (adhesion). This leaves no   potential space for fluid to build up. The risks include infection, blood loss, and side effects from general anesthesia.  If the effusion is caused by infection, it may be treated with antibiotics and may improve without draining. HOME CARE  INSTRUCTIONS   Take any medicines exactly as prescribed.  Follow up with your health care provider as directed.  Monitor your exercise capacity (the amount of walking you can do before you get short of breath).  Do not use any tobacco products including cigarettes, chewing tobacco, or electronic cigarettes. SEEK MEDICAL CARE IF:   Your exercise capacity seems to get worse or does not improve with time.  You do not recover from your illness.  You have drainage, redness, swelling, or pain at any incision or puncture sites. SEEK IMMEDIATE MEDICAL CARE IF:   Shortness of breath or chest pain develops or gets worse.  You have a fever.  You develop a new cough, especially if the mucus (phlegm) is discolored. MAKE SURE YOU:   Understand these instructions.  Will watch your condition.  Will get help right away if you are not doing well or get worse. Document Released: 11/12/2005 Document Revised: 03/29/2014 Document Reviewed: 07/04/2007 [facility] Patient Information 2015 [place], [place]. This information is not intended to replace advice given to you by your health care provider. Make sure you discuss any questions you have with your health care provider.  Musculoskeletal Pain Musculoskeletal pain is muscle and boney aches and pains. These pains can occur in any part of the body. Your caregiver may treat you without knowing the cause of the pain. They may treat you if blood or urine tests, X-rays, and other tests were normal.  CAUSES There is often not a definite cause or reason for these pains. These pains may be caused by a type of germ (virus). The discomfort may also come from overuse. Overuse includes working out too hard when your body is not fit. Boney aches also come from weather changes. Bone is sensitive to atmospheric pressure changes. HOME CARE INSTRUCTIONS   Ask when your test results will be ready. Make sure you get your test results.  Only take over-the-counter or  prescription medicines for pain, discomfort, or fever as directed by your caregiver. If you were given medications for your condition, do not drive, operate machinery or power tools, or sign legal documents for 24 hours. Do not drink alcohol. Do not take sleeping pills or other medications that may interfere with treatment.  Continue all activities unless the activities cause more pain. When the pain lessens, slowly resume normal activities. Gradually increase the intensity and duration of the activities or exercise.  During periods of severe pain, bed rest may be helpful. Lay or sit in any position that is comfortable.  Putting ice on the injured area.  Put ice in a bag.  Place a towel between your skin and the bag.  Leave the ice on for 15 to 20 minutes, 3 to 4 times a day.  Follow up with your caregiver for continued problems and no reason can be found for the pain. If the pain becomes worse or does not go away, it may be necessary to repeat tests or do additional testing. Your caregiver may need to look further for a possible cause. SEEK IMMEDIATE MEDICAL CARE IF:  You have pain that is getting worse and is not relieved by medications.  You develop chest pain that is associated with shortness or breath, sweating, feeling sick to your stomach (  nauseous), or throw up (vomit).  Your pain becomes localized to the abdomen.  You develop any new symptoms that seem different or that concern you. MAKE SURE YOU:   Understand these instructions.  Will watch your condition.  Will get help right away if you are not doing well or get worse. Document Released: 11/12/2005 Document Revised: 02/04/2012 Document Reviewed: 07/17/2013 [facility] Patient Information 2015 [place], [place]. This information is not intended to replace advice given to you by your health care provider. Make sure you discuss any questions you have with your health care provider.

## 2014-09-29 NOTE — ED Notes (Signed)
Pt reports right shoulder/ scapula pain x2 days, woke up from sleep with pain. Pain 7/10.

## 2014-09-29 NOTE — ED Provider Notes (Signed)
CSN: [id number]     Arrival date & time 09/29/14  1419 History   First MD Initiated Contact with Patient 09/29/14 1423     Chief Complaint  Patient presents with  . Shoulder Pain     (Consider location/radiation/quality/duration/timing/severity/associated sxs/prior Treatment) Patient is a 48 y.o. female presenting with shoulder pain. The history is provided by the patient.  Shoulder Pain Location:  Shoulder Time since incident:  2 days Injury: no   Shoulder location:  R shoulder Pain details:    Quality:  Aching and sharp   Severity:  Mild   Onset quality:  Sudden   Progression:  Unchanged Chronicity:  New Handedness:  Left-handed Dislocation: no   Prior injury to area:  No Relieved by:  Being still Worsened by:  Movement (intermittently worse with deep breaths) Ineffective treatments:  NSAIDs Associated symptoms: no fever     Past Medical History  Diagnosis Date  . Congenital birth defect     Right hand  . Anemia   . Arthritis     knees  . Endometrial cancer   . Anxiety   . Swelling     ANKLES - TAKES LASIX  . Depression   . HLD (hyperlipidemia)     borderline  . Obesity   . Hx of radiation therapy 04/07/13- 05/14/13    pelvis 45 gray 25 fx  . Hx of echocardiogram     Echo (9/15): EF 50-55%, normal wall motion, grade 2 diastolic dysfunction, mild LAE   Past Surgical History  Procedure Laterality Date  . Hiatal hernia repair    . Repair vaginal cuff  10/07/2012    Procedure: REPAIR VAGINAL CUFF;  Surgeon: [name], MD PHD;  Location: [facility];  Service: Gynecology;;  . Robotic assisted total hysterectomy with bilateral salpingo oopherectomy  10/07/2012    Procedure: ROBOTIC ASSISTED TOTAL HYSTERECTOMY WITH BILATERAL SALPINGO OOPHORECTOMY;  Surgeon: [name], MD PHD;  Location: [facility];  Service: Gynecology;  Laterality: Bilateral;  . Abdominal hysterectomy  10/2011    complete   Family History  Problem Relation Age of Onset  . Pancreatic cancer  Maternal Grandfather     or liver cancer  . Hypertension Maternal Grandfather   . Cancer Maternal Grandfather     pancreatic  . Hypertension Maternal Grandmother   . Diabetes Mother   . Colon polyps Maternal Aunt   . Cancer Maternal Aunt     lung  . Colon cancer Cousin   . Cancer Cousin     lung  . Lung cancer Maternal Aunt   . Heart attack Neg Hx   . Stroke Cousin    History  Substance Use Topics  . Smoking status: Never Smoker   . Smokeless tobacco: Never Used  . Alcohol Use: Yes     Comment: rare   OB History    Gravida Para Term Preterm AB TAB SAB Ectopic Multiple Living   2 1 0 1 1 0 1 0 0 1      Review of Systems  Constitutional: Negative for fever.  Respiratory: Negative for cough and shortness of breath.   All other systems reviewed and are negative.     Allergies  Review of patient's allergies indicates no known allergies.  Home Medications   Prior to Admission medications   Medication Sig Start Date End Date Taking? Authorizing Provider  acetaminophen (TYLENOL) 500 MG tablet Take 1,000 mg by mouth every 6 (six) hours as needed for moderate pain (pain).   Yes Historical  Provider, MD  aspirin 81 MG tablet Take 81 mg by mouth every morning.    Yes Historical Provider, MD  carvedilol (COREG) 3.125 MG tablet Take 1 tablet in the am and 2 in the pm. 08/19/14  Yes Minus Breeding, MD  diazepam (VALIUM) 5 MG tablet Take 5 mg by mouth every 8 (eight) hours as needed for anxiety (anxiety). Disp. 30 tablets. 0 refills.  Called in to [facility] on [facility] per Dr. Sondra Come. 09/13/14  Yes Jessyca Sloan Promise, MD  loperamide (IMODIUM) 2 MG capsule Take 2 mg by mouth 4 (four) times daily as needed for diarrhea or loose stools (diarrhea).    Yes Historical Provider, MD  magnesium chloride (SLOW-MAG) 64 MG TBEC SR tablet Take 2 tablets by mouth 2 (two) times daily. 12/02/13  Yes Leone Haven, MD  potassium chloride (K-DUR) 10 MEQ tablet Take two tablets twice a day or as  directed. 09/28/14  Yes Leone Haven, MD  sertraline (ZOLOFT) 100 MG tablet take 1 tablet by mouth once daily 06/02/14  Yes Leone Haven, MD  bifidobacterium infantis (ALIGN) capsule Take 1 capsule by mouth daily as needed. 10/03/12   Jerene Bears, MD  cyclobenzaprine (FLEXERIL) 10 MG tablet Take 1 tablet (10 mg total) by mouth 3 (three) times daily as needed for muscle spasms. 06/02/14   Leone Haven, MD  docusate sodium (COLACE) 50 MG capsule Take 50 mg by mouth 2 (two) times daily as needed. 11/09/13   Leone Haven, MD  ferrous sulfate 325 (65 FE) MG tablet 325 mg every evening. Takes twice a week    Historical Provider, MD  lactase (LACTAID) 3000 UNITS tablet Take 1 tablet by mouth 3 (three) times daily with meals as needed. 10/03/12   Jerene Bears, MD  loratadine (CLARITIN) 10 MG tablet Take 1 tablet (10 mg total) by mouth daily. Patient taking differently: Take 10 mg by mouth daily as needed for allergies (allergies).  11/09/13   Leone Haven, MD  mometasone (NASONEX) 50 MCG/ACT nasal spray Place 2 sprays into the nose daily. One spray in each nostril. Patient taking differently: Place 2 sprays into the nose as needed (nasal congestion). One spray in each nostril. 11/09/13   Leone Haven, MD  OVER THE COUNTER MEDICATION Allergy Plus Sinus Headache- acetaminophen 325, diphenhydramine 12.5, phenylephrine 5 mg    Historical Provider, MD  OVER THE COUNTER MEDICATION Take 5 mg by mouth as needed (ALLERGY / SINUS). Medi-phenyl 5 mg    Historical Provider, MD  polyethylene glycol powder (GLYCOLAX/MIRALAX) powder Take 17 g by mouth daily as needed. 11/10/13   Leone Haven, MD   BP 129/73 mmHg  Pulse 97  Temp(Src) 99 F (37.2 C) (Oral)  Resp 16  SpO2 98%  LMP 09/20/2012 Physical Exam  Constitutional: She is oriented to person, place, and time. She appears well-developed and well-nourished. No distress.  HENT:  Head: Normocephalic and atraumatic.  Mouth/Throat:  Oropharynx is clear and moist.  Eyes: EOM are normal. Pupils are equal, round, and reactive to light.  Neck: Normal range of motion. Neck supple.  Cardiovascular: Normal rate and regular rhythm.  Exam reveals no friction rub.   No murmur heard. Pulmonary/Chest: Effort normal and breath sounds normal. No respiratory distress. She has no wheezes. She has no rales.  Abdominal: Soft. She exhibits no distension. There is no tenderness. There is no rebound.  Musculoskeletal: Normal range of motion. She exhibits no edema.  Cervical back: She exhibits no tenderness, no bony tenderness and no spasm.  R arm shorter, 2 fingers on R hand  Neurological: She is alert and oriented to person, place, and time. No cranial nerve deficit. She exhibits normal muscle tone. Coordination normal.  Skin: No rash noted. She is not diaphoretic.  Nursing note and vitals reviewed.   ED Course  Procedures (including critical care time) Labs Review Labs Reviewed  BASIC METABOLIC PANEL - Abnormal; Notable for the following:    GFR calc non Af Amer 83 (*)    All other components within normal limits  CBC  TROPONIN I  D-DIMER, QUANTITATIVE  PRO B NATRIURETIC PEPTIDE    Imaging Review Dg Chest 2 View  09/29/2014   CLINICAL DATA:  Chest pain. Chest and right shoulder pain for 1 day.  EXAM: CHEST  2 VIEW  COMPARISON:  10/03/2012.  FINDINGS: Heart size is normal. The lungs are clear. There is mild right pleural thickening or small pleural effusion. Mild thoracic degenerative changes are present.  IMPRESSION: 1. Small right pleural effusion or pleural thickening. 2. No pulmonary edema or consolidations.   Electronically Signed   By: Shon Hale M.D.   On: 09/29/2014 15:18     EKG Interpretation   Date/Time:  Wednesday September 29 2014 14:55:18 EST Ventricular Rate:  90 PR Interval:  122 QRS Duration: 90 QT Interval:  354 QTC Calculation: 433 R Axis:   16 Text Interpretation:  Sinus rhythm Abnormal R-wave  progression, early  transition Baseline wander in lead(s) I III aVL Similar to prior Confirmed  by Mingo Amber  MD, [name] ([id number]) on 09/29/2014 3:49:08 PM      MDM   Final diagnoses:  Chest pain  Pleural effusion  Right-sided thoracic back pain    27F here with R scapular pain. Began yesterday, when getting up, started when she was moving in the bed. Pain with deep breathing sometimes, states in lower R trapezius along the midline. No pain like this before. No relief with pain meds at home. Hx of endometrial cancer, in remission now, no current chemo or radiation at this time. No prior blood clots. PERC negative here.  Patient without any spasm, no palpable tenderness.  New pleural effusion on exam, will check BNP. Pain seems more musculoskeletal to me, doubt PE with negative PERC and history isn't c/w PE. Labs ok. Stable for discharge, instructed to f/u with PCP.  Evelina Bucy, MD 09/29/14 2329

## 2014-10-01 ENCOUNTER — Ambulatory Visit (INDEPENDENT_AMBULATORY_CARE_PROVIDER_SITE_OTHER): Payer: Medicaid Other | Admitting: Family Medicine

## 2014-10-01 ENCOUNTER — Encounter: Payer: Self-pay | Admitting: Family Medicine

## 2014-10-01 VITALS — BP 123/79 | HR 83 | Temp 98.2°F | Wt 316.0 lb

## 2014-10-01 DIAGNOSIS — J90 Pleural effusion, not elsewhere classified: Secondary | ICD-10-CM

## 2014-10-01 MED ORDER — FUROSEMIDE 20 MG PO TABS: 20.0000 mg | ORAL_TABLET | Freq: Every day | ORAL | Status: DC

## 2014-10-01 NOTE — Patient Instructions (Signed)
  The pleural effusion you have is VERY small and is probably not anything bad.  Let's restart lasix 20mg  daily.  Come back in 2 weeks to reassess your swelling and re-listen to your lungs. Will need a repeat chest xray at that time, as well as repeat labwork.  Be well, Dr. Ardelia Mems

## 2014-10-05 IMAGING — MG MM DIGITAL SCREENING BILAT
7 series · 7 of 7 positions shown · non-contrast
Comparison: None.

CLINICAL DATA: Screening.

DIGITAL BILATERAL SCREENING MAMMOGRAM WITH CAD

[R CC]
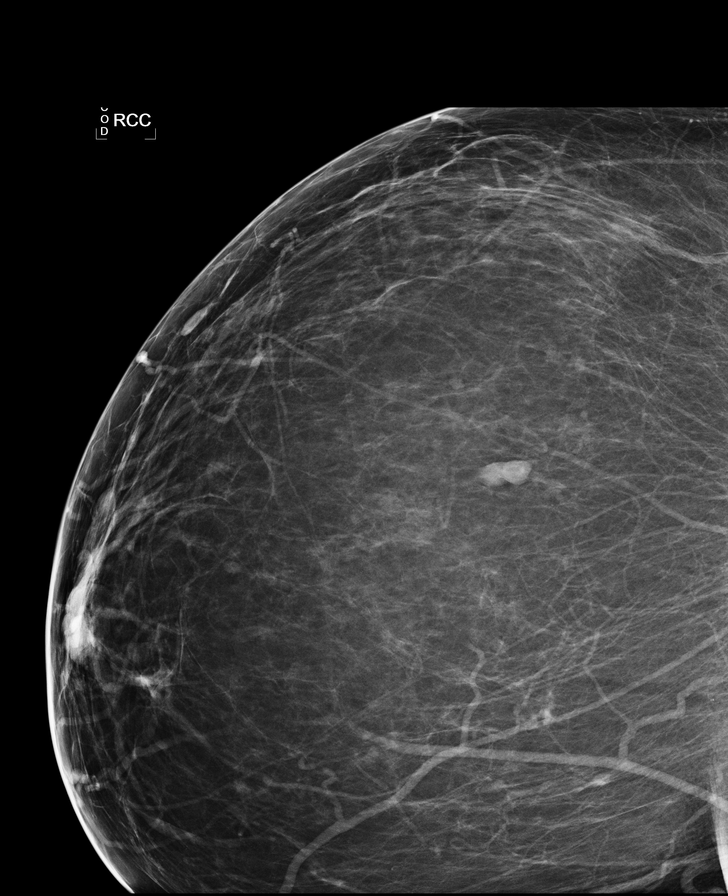

[L CC]
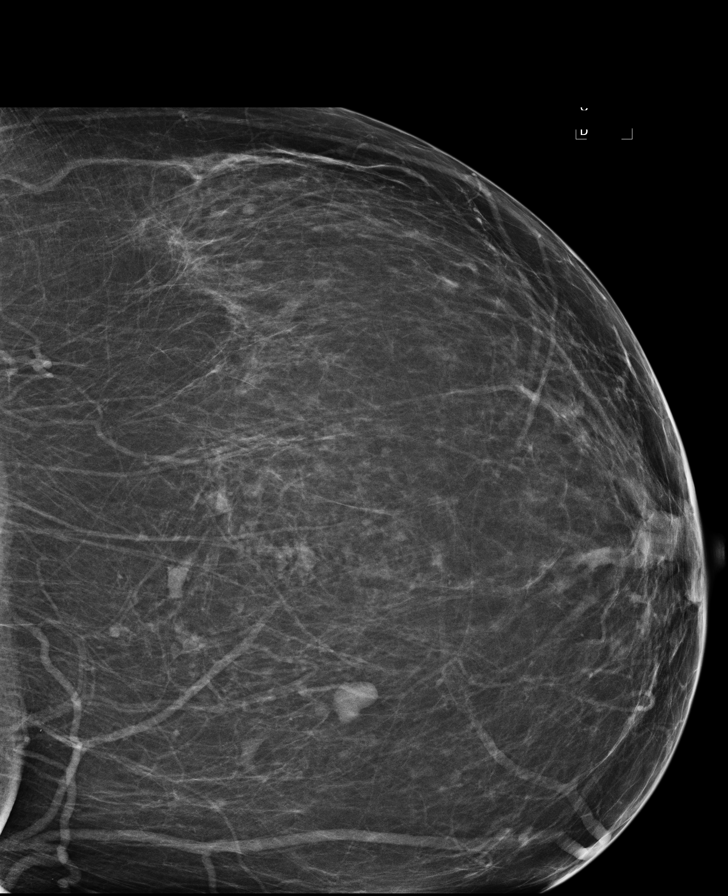

[L MLO (1 of 2)]
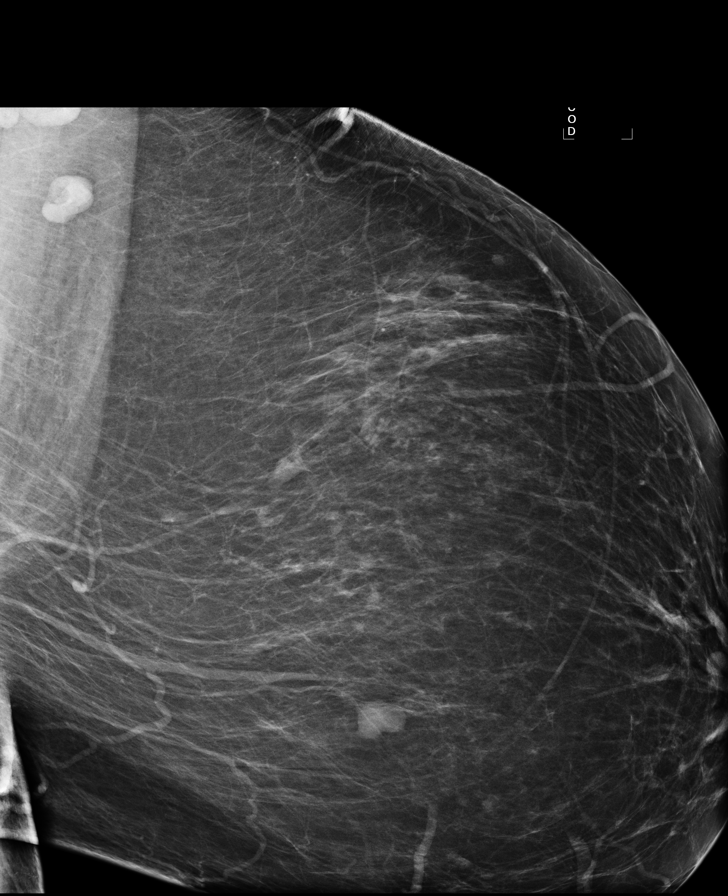

[R MLO (1 of 2)]
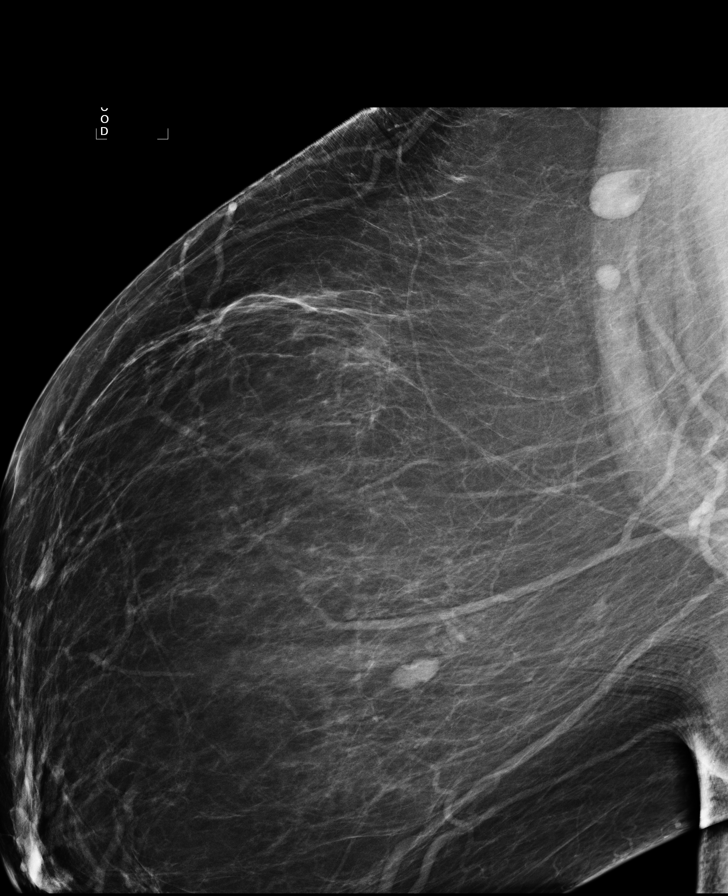

[R CV]
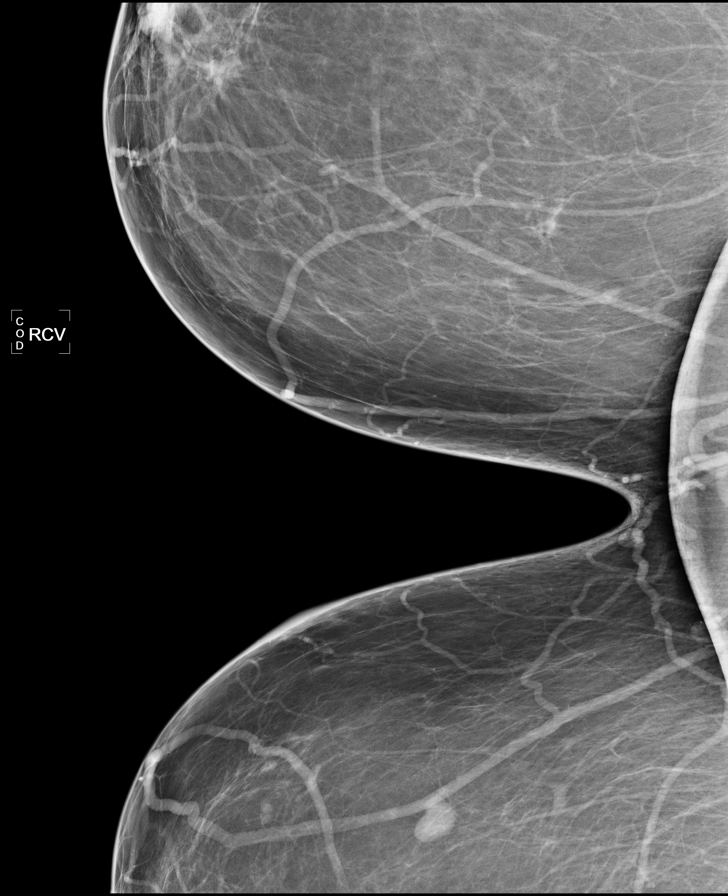

[L MLO (2 of 2)]
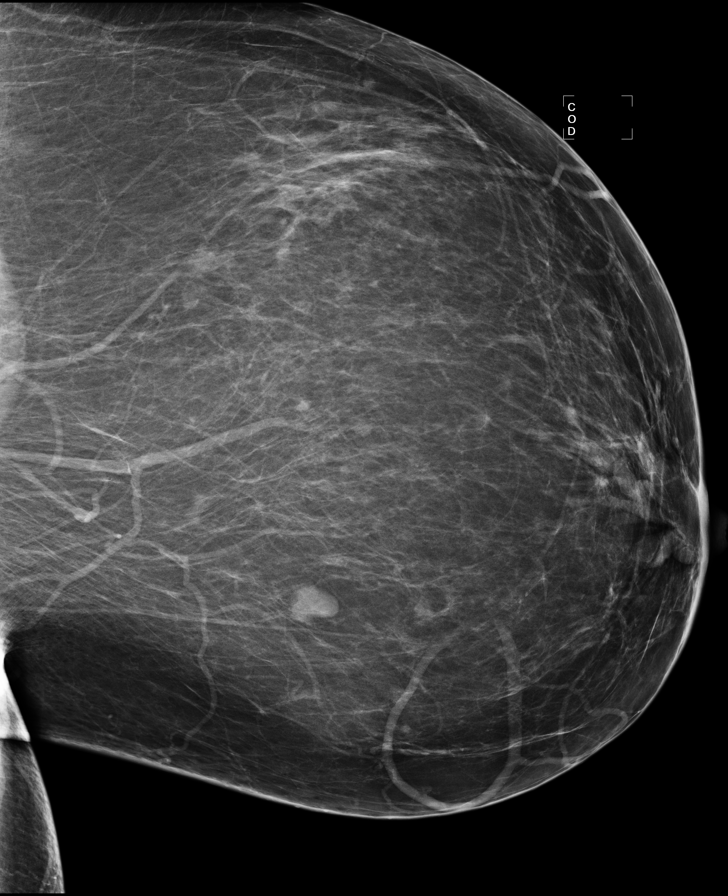

[R MLO (2 of 2)]
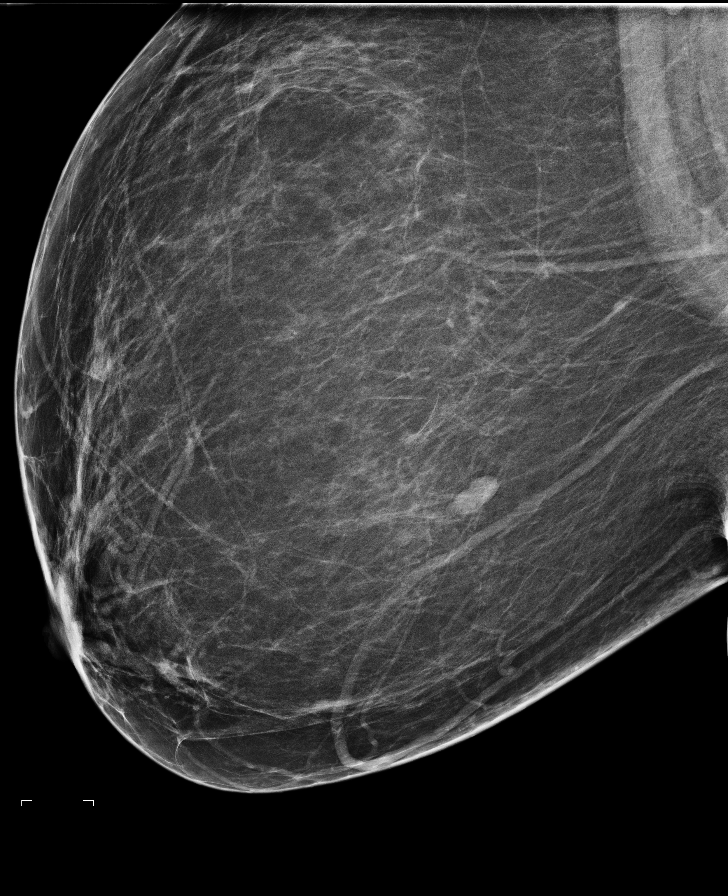

[7 of 7 positions shown; findings below may reference images not displayed]

FINDINGS: Breast Density: 2: There is a scattered fibroglandular pattern.

In the left breast, possible mass warrants further imaging
evaluation with spot compression views and possible ultrasound.  In
the right breast, a possible mass warrants further imaging
evaluation with spot compression views of possible ultrasound.

Images were processed with CAD.
IMPRESSION: Further imaging evaluation is suggested for possible mass in the
left breast.
Further imaging evaluation is suggested for possible mass in the
right breast.

RECOMMENDATION:
Diagnostic mammogrampossible ultrasoundof both breasts. (Code:[id number]-
00M)

The patient will be contacted regarding the findings, and
additional imaging will be scheduled.

BI-RADS CATEGORY 0:  Incomplete.  Need additional imaging
evaluation and/or prior mammograms for comparison.

## 2014-10-07 DIAGNOSIS — J90 Pleural effusion, not elsewhere classified: Secondary | ICD-10-CM | POA: Insufficient documentation

## 2014-10-07 NOTE — Progress Notes (Signed)
Patient ID: Deborah [name], female   DOB: February 10, 1966, 48 y.o.   MRN: [id number]  HPI:  Pt presents for a same day appointment to discuss pleural effusion, which was noted during recent trip to the ER.  Pt was seen in ER for severe R posterior shoulder pain. Chest xray was done, which showed small R pleural effusion. Pt was stable and discharged from ER with instructions to f/u with PCP office for the effusion. Pt denies SOB. She is s/p chemotherapy treatments for endometrial cancer within the last year and has followed up with oncology regularly. She used to take lasix 20mg  daily, but this was stopped due to low potassium about a year ago. She has noticed increased swelling in her legs since stopping the lasix. Shoulder pain is gradually improving, was thought to be MSK-related by the emergency room physician.   ROS: See HPI  [place]: hx GERD, anxiety/dep, endometrial cancer, knee osteoarthritis  PHYSICAL EXAM: BP 123/79 mmHg  Pulse 83  Temp(Src) 98.2 F (36.8 C) (Oral)  Wt 316 lb (143.337 kg)  LMP 09/20/2012 Gen: NAD, morbidly obese HEENT: NCAT Heart: RRR, no murmurs Lungs: CTAB, NWOB, no crackles or decreased breath sounds heard Abdomen: obese Neuro: grossly nonfocal, speech normal Ext: mild bilat lower extremity edema although difficult to differentiate edema from morbid obesity  ASSESSMENT/PLAN:  Pleural effusion CXR reviewed. Pleural effusion is very small and patient seems asymptomatic at this time. She has no symptoms concerning for heart failure. She does have some lower extremity edema which has gradually increased over the last year since stopping her lasix.  Plan: - resume lasix 20mg  daily (advised pt can take every other day if she prefers this) - f/u in 2 weeks to reassess lung exam, edema, and volume status. Will recheck BMET at that appointment as well. - repeat CXR at that visit in 2 weeks. If effusion is enlarging, will need CT of chest. If same or smaller, will likely  continue to observe. - Precepted with Dr. Ree Kida who also reviewed xray and agrees with this plan.     FOLLOW UP: F/u in 2 weeks for pleural effusion (needs BMET & CXR at that visit).  [place]. Deborah [name], [place]

## 2014-10-07 NOTE — Assessment & Plan Note (Addendum)
CXR reviewed. Pleural effusion is very small and patient seems asymptomatic at this time. She has no symptoms concerning for heart failure. She does have some lower extremity edema which has gradually increased over the last year since stopping her lasix.  Plan: - resume lasix 20mg  daily (advised pt can take every other day if she prefers this) - f/u in 2 weeks to reassess lung exam, edema, and volume status. Will recheck BMET at that appointment as well. - repeat CXR at that visit in 2 weeks. If effusion is enlarging, will need CT of chest. If same or smaller, will likely continue to observe. - Precepted with Dr. Ree Kida who also reviewed xray and agrees with this plan.

## 2014-10-13 ENCOUNTER — Telehealth: Payer: Self-pay | Admitting: Oncology

## 2014-10-13 NOTE — Telephone Encounter (Signed)
Deborah [name] called requesting a refill on diazepam called in to [facility].  She said she needs it today.

## 2014-10-13 NOTE — Telephone Encounter (Signed)
Called in refill for diazepam (Valium) 5 mg tablets - Take 5 mg by mouth every 8 (eight) hours as needed for anxiety (anxiety). Disp. 30 tablets. 3 refills. Called in to [place] on [facility] per Dr. Sondra Come.  Called Deborah [name] to let her know it had been called.  [name] verbalized agreement.

## 2014-10-14 ENCOUNTER — Ambulatory Visit: Payer: Medicaid Other | Admitting: Family Medicine

## 2014-10-14 ENCOUNTER — Telehealth: Payer: Self-pay | Admitting: Family Medicine

## 2014-10-14 NOTE — Telephone Encounter (Signed)
Pt  Called and wanted Dr. Ardelia Mems to know that she was too sick to make it today to her appointment. jw

## 2014-10-18 ENCOUNTER — Ambulatory Visit (INDEPENDENT_AMBULATORY_CARE_PROVIDER_SITE_OTHER): Payer: Medicaid Other | Admitting: Family Medicine

## 2014-10-18 VITALS — BP 106/70 | HR 101 | Temp 98.1°F | Wt 318.0 lb

## 2014-10-18 DIAGNOSIS — J90 Pleural effusion, not elsewhere classified: Secondary | ICD-10-CM

## 2014-10-18 DIAGNOSIS — L0211 Cutaneous abscess of neck: Secondary | ICD-10-CM

## 2014-10-18 LAB — BASIC METABOLIC PANEL
BUN: 18 mg/dL (ref 6–23)
CHLORIDE: 102 meq/L (ref 96–112)
CO2: 28 meq/L (ref 19–32)
Calcium: 9.2 mg/dL (ref 8.4–10.5)
Creat: 1.06 mg/dL (ref 0.50–1.10)
Glucose, Bld: 86 mg/dL (ref 70–99)
Potassium: 3.7 mEq/L (ref 3.5–5.3)
Sodium: 142 mEq/L (ref 135–145)

## 2014-10-18 NOTE — Progress Notes (Signed)
Patient ID: Deborah [name], female   DOB: [birth year], 48 y.o.   MRN: [id number]  HPI:  Pleural effusion: has been doing lasix 20mg  every other day. Prior MSK chest pain has resolved. No shortness of breath. Swelling improved with lasix.  R neck abscess: first noticed scratch on R lateral neck a few weeks ago. About Wednesday of last week began to have more of a bump there. Has progressively gotten larger, now starting to form a head. The area is red and tender. No fevers. Feels well otherwise.   ROS: See HPI.  [name]: hx endometrial cancer, obesity, GERD, anx/dep,   PHYSICAL EXAM: BP 106/70 mmHg  Pulse 101  Temp(Src) 98.1 F (36.7 C) (Oral)  Wt 318 lb (144.244 kg)  LMP 09/20/2012 Gen: NAD HEENT: NCAT. R lateral mid neck with ~2cm oval-shaped abscess which is erythematous and tender to palpation, almost forming head. Indurated but nonfluctuant due to how tensely filled it is. No surrounding erythema. Full ROM of neck, no swelling or erythema tracking up or down neck. Heart: RRR no murmurs Lungs: CTAB NWOB, no crackles Neuro: grossly nonfocal speech normal Ext: obese extremities but swelling improved from last visit, no pitting.  ASSESSMENT/PLAN:  Abscess of neck After informed consent was obtained and consent documented in the chart, the skin of the R neck was cleaned with betadine x3. Local anesthesia was obtained with appx 3 cc of 1% lidocaine with epinephrine. An 11 blade was used to make a 0.5 cm incision into the skin with immediate expression of purulent drainage. The abscess was then externally manipulated to aid in further expression of pus. The abscess cavity was not packed. Patient tolerated the procedure well, with no immediate complications. The incision was covered with a bandage. Pt given post-procedure instructions per AVS. She will follow up in 2 days to re-evaluate abscess area.  Pleural effusion Remains asymptomatic. Lower extremity edema improved since resuming lasix  20mg  daily. Will check BMET, Mag, CXR today to evaluate for improvement.   FOLLOW UP: F/u in 2 days for re-evaluate abscess.  [place]. Ardelia Mems, [place]

## 2014-10-18 NOTE — Assessment & Plan Note (Signed)
Remains asymptomatic. Lower extremity edema improved since resuming lasix 20mg  daily. Will check BMET, Mag, CXR today to evaluate for improvement.

## 2014-10-18 NOTE — Assessment & Plan Note (Signed)
After informed consent was obtained and consent documented in the chart, the skin of the R neck was cleaned with betadine x3. Local anesthesia was obtained with appx 3 cc of 1% lidocaine with epinephrine. An 11 blade was used to make a 0.5 cm incision into the skin with immediate expression of purulent drainage. The abscess was then externally manipulated to aid in further expression of pus. The abscess cavity was not packed. Patient tolerated the procedure well, with no immediate complications. The incision was covered with a bandage. Pt given post-procedure instructions per AVS. She will follow up in 2 days to re-evaluate abscess area.

## 2014-10-18 NOTE — Patient Instructions (Addendum)
Keep the area clean and dry Return on Wednesday to be rechecked Schedule this on the way out  Checking kidneys, electrolytes, and chest xray  Be well, Dr. Ardelia Mems    Incision and Drainage Care After Refer to this sheet in the next few weeks. These instructions provide you with information on caring for yourself after your procedure. Your caregiver may also give you more specific instructions. Your treatment has been planned according to current medical practices, but problems sometimes occur. Call your caregiver if you have any problems or questions after your procedure. HOME CARE INSTRUCTIONS   If antibiotic medicine is given, take it as directed. Finish it even if you start to feel better.  Only take over-the-counter or prescription medicines for pain, discomfort, or fever as directed by your caregiver.  Keep all follow-up appointments as directed by your caregiver.  Change any bandages (dressings) as directed by your caregiver. Replace old dressings with clean dressings.  Wash your hands before and after caring for your wound. You will receive specific instructions for cleansing and caring for your wound.  SEEK MEDICAL CARE IF:   You have increased pain, swelling, or redness around the wound.  You have increased drainage, smell, or bleeding from the wound.  You have muscle aches, chills, or you feel generally sick.  You have a fever. MAKE SURE YOU:   Understand these instructions.  Will watch your condition.  Will get help right away if you are not doing well or get worse. Document Released: 02/04/2012 Document Reviewed: 02/04/2012 [facility] Patient Information 2015 [place]. This information is not intended to replace advice given to you by your health care provider. Make sure you discuss any questions you have with your health care provider.

## 2014-10-19 ENCOUNTER — Telehealth: Payer: Self-pay | Admitting: Family Medicine

## 2014-10-19 ENCOUNTER — Ambulatory Visit (HOSPITAL_COMMUNITY)
Admission: RE | Admit: 2014-10-19 | Discharge: 2014-10-19 | Disposition: A | Discharge status: Home / Self Care | Payer: Medicaid Other | Source: Ambulatory Visit | Attending: Family Medicine | Admitting: Family Medicine

## 2014-10-19 ENCOUNTER — Encounter: Payer: Self-pay | Admitting: Oncology

## 2014-10-19 DIAGNOSIS — C541 Malignant neoplasm of endometrium: Secondary | ICD-10-CM | POA: Insufficient documentation

## 2014-10-19 DIAGNOSIS — J90 Pleural effusion, not elsewhere classified: Secondary | ICD-10-CM

## 2014-10-19 LAB — MAGNESIUM: Magnesium: 1.4 mg/dL — ABNORMAL LOW (ref 1.5–2.5)

## 2014-10-19 NOTE — Telephone Encounter (Signed)
Pt said that her bandage is coming off, she should put another one on or keep it off so it can breathe. She also requested a handicap sticker and copy of the consent form from yesterday. Bosco Paparella CMA

## 2014-10-19 NOTE — Telephone Encounter (Signed)
Pt called and needs to talk to the nurse for Dr. Ardelia Mems, she had an procedure done on 11/23 that Dr. Ardelia Mems wanted her to have done. She has some questions. jw

## 2014-10-19 NOTE — Progress Notes (Signed)
Deborah [name] stopped by the clinic to pick up her pap smear results.  She also asked if her blood pressure could be taken.  It was 124/78 and her heart rate was 98.

## 2014-10-20 ENCOUNTER — Ambulatory Visit (INDEPENDENT_AMBULATORY_CARE_PROVIDER_SITE_OTHER): Payer: Medicaid Other | Admitting: Family Medicine

## 2014-10-20 VITALS — BP 121/76 | HR 80 | Temp 98.4°F | Ht 66.0 in | Wt 317.0 lb

## 2014-10-20 DIAGNOSIS — J90 Pleural effusion, not elsewhere classified: Secondary | ICD-10-CM

## 2014-10-20 DIAGNOSIS — L0211 Cutaneous abscess of neck: Secondary | ICD-10-CM

## 2014-10-20 NOTE — Progress Notes (Signed)
Patient ID: JANIA STEINKE, female   DOB: 12/06/1965, 48 y.o.   MRN: [id number] Subjective:   CC: Follow up abscess and labs  HPI:   FOLLOW UP ABSCESS Abscess had been present right lateral neck x a few weeks and 5-6 days prior to arrival had developed a bump that had enlarged and formed a head, red and tender at visit 2 days ago. Patient seen at that time and abscess right neck was I&D'ed with good results. No fevers at that time and felt well otherwise. Since then, has felt good with no systemic symptoms including fevers or chills, no tenderness, and has not checked to see if it has enlarged or gotten more red.   FOLLOW UP LABS Patient had pleural effusion for which she was following up in clinic. Had been taking lasix daily. Denies chest pain, dyspnea, and reports leg swelling has not returned. Labs drawn last visit and pt wants these reviewed.   Review of Systems - Per HPI.  SH: Never smoker    Objective:  Physical Exam BP 121/76 mmHg  Pulse 80  Temp(Src) 98.4 F (36.9 C) (Oral)  Ht 5\' 6"  (1.676 m)  Wt 317 lb (143.79 kg)  BMI 51.19 kg/m2  LMP 09/20/2012  LMP 09/20/2012 GEN: NAD HEENT: Right neck 2cm x 1.5cm induration, no drainage, mild fluctuance but open area where drainage can occur; no drainage on manual expression of this area; no surrounding swelling, erythema or warmth; mildly tender PULM: Normal effort    Assessment:     NICEY KRAH is a 48 y.o. female here for f/u of recent I&D of neck abscess and labs.    Plan:     # See problem list and after visit summary for problem-specific plans.   # Health Maintenance:  - Handicapped placard application given to patient upon request and informed her it would need to be filled out by PCP. She requests filled out and she be called by Tuesday. Informed her I would route request to PCP but could not guarantee anything given upcoming holiday weekend.   Follow-up: Follow up in 1 week for f/u of neck abscess  I&D.   Hilton Sinclair, MD [place]

## 2014-10-20 NOTE — Telephone Encounter (Signed)
Called patient to discuss labwork.   Magnesium: Recommended she increase her slowmag supplementation to 3 pills per day (two in morning, one in afternoon) since her mag was slightly low. She has just been taking two pills per day.  Creatinine: mildly elevated at 1.06 above baseline of around 0.8. Discussed with patient that she can continue current medicines, follow up with PCP in a few weeks for recheck of BMET to ensure stable.  Pleural effusion: no longer seen on xray (official read doesn't mention it at all, and it has resolved per my read of the film). No further workup indicated.  Consent form copy: advised I will place a copy of this at the front desk for her to keep in her records.  Route to PCP as FYI.  Leeanne Rio, MD

## 2014-10-20 NOTE — Patient Instructions (Signed)
Your abscess area looks good.  Change dressing on it every day until it is healing well. Use warm compresses daily and let warm water run over it to help promote drainage. If you have fevers/chills, worsened symptoms, worsened redness, increased area of firmness, or other concerns seek immediate care.  Dr Ardelia Mems will call you about your labs later today but we reviewed these today and they look good.  We will have to call you  Back about the consent form as this has already gone through processing.  I will leave the handicapped form in Dr Ellen Henri box and let him know you would like it by Tuesday. I am not sure of his timeframe but if you do not get a call by Tuesday, please call us.  Your chest xray results:  CLINICAL DATA: Endometrial cancer.  EXAM: CHEST 2 VIEW  COMPARISON: None.  FINDINGS: Mediastinum and hilar structures are normal. Lungs are clear. Heart size normal. No acute bony abnormality.  IMPRESSION: No active cardiopulmonary disease.

## 2014-10-23 NOTE — Assessment & Plan Note (Addendum)
Appears to be healing with persistent induration in about 1.5x2cm area (not worsened from last visit, inflammatory), mild erythema, minimal central fluctuance, no drainage on manual attempt at expression. Afebrile and well-appearing systemically. Minimal light brown serous drainage on dressing. - Begin daily warm compresses and let warm shower water run over it. - Dressing changes daily until wound more healed/drier, at which point can leave undressed; redressed in clinic. - F/u next week for one more check - Return sooner if fevers, increasing area of redness/induration, increased purulent drainage, or other concerns. - Precepted and examined with Dr Gwendlyn Deutscher

## 2014-10-23 NOTE — Assessment & Plan Note (Signed)
Obtained due to lasix use for pleural effusion. BMET normal with mild increase in Cr to 1.06 from 0.8. Mg 1.4 from 1.3. CXR with resolved pleural effusion. Symptoms resolved. - Stay hydrated. - Dr Ardelia Mems (ordering provider) to call patient with any further recs based on labwork.

## 2014-10-25 ENCOUNTER — Telehealth: Payer: Self-pay | Admitting: Family Medicine

## 2014-10-25 NOTE — Telephone Encounter (Signed)
Will route this to Dr Caryl Bis. I had told patient I would place this in his box and inform him which I did last week. I also let her know that because it was the Thanksgiving holiday, I could not guarantee he would complete it by Tuesday.  Hilton Sinclair, MD

## 2014-10-25 NOTE — Telephone Encounter (Signed)
Pt checking status of handicap placard, says she left it with Dr. Darene Lamer when she was seen last. It is in MD box waiting for completion.

## 2014-10-25 NOTE — Telephone Encounter (Signed)
Will forward to MD to see if this form has been completed.  Burma Ketcher,CMA

## 2014-10-26 NOTE — Telephone Encounter (Signed)
Pt informed that form is complete and ready for pick up.  Derl Barrow, RN

## 2014-10-26 NOTE — Telephone Encounter (Signed)
Form filled out. Given to [employer].

## 2014-10-28 ENCOUNTER — Ambulatory Visit: Payer: Medicaid Other | Admitting: Gynecologic Oncology

## 2014-10-28 NOTE — Addendum Note (Signed)
Addended by: Conni Slipper T on: 10/28/2014 09:53 AM   Modules accepted: Level of Service

## 2014-11-01 ENCOUNTER — Ambulatory Visit: Payer: Medicaid Other | Admitting: Family Medicine

## 2014-11-01 ENCOUNTER — Telehealth: Payer: Self-pay | Admitting: Oncology

## 2014-11-01 NOTE — Telephone Encounter (Signed)
lvm for pt regarding to march d.t change per MD request....mailed pt appt sched/letter

## 2014-11-04 ENCOUNTER — Ambulatory Visit: Payer: Medicaid Other | Admitting: Family Medicine

## 2014-11-12 ENCOUNTER — Encounter: Payer: Self-pay | Admitting: Family Medicine

## 2014-11-12 ENCOUNTER — Ambulatory Visit (INDEPENDENT_AMBULATORY_CARE_PROVIDER_SITE_OTHER): Payer: Medicaid Other | Admitting: Family Medicine

## 2014-11-12 VITALS — BP 129/87 | HR 80 | Temp 98.1°F | Ht 66.0 in | Wt 317.9 lb

## 2014-11-12 DIAGNOSIS — L0211 Cutaneous abscess of neck: Secondary | ICD-10-CM

## 2014-11-12 NOTE — Progress Notes (Signed)
Patient ID: SATIVA GELLES, female   DOB: 08-07-1966, 48 y.o.   MRN: [id number] Subjective:   CC: F/u abscess  HPI:   Seen 11/28 for f/u of abscess I&D on right neck. At that time, mild erythema and induration thought post-inflammatory. Since then, bandage caused irritation but patient has taken this off and finished antibiotics. Neck is healing well and she has been afebrile. Feels like it is doing well. Itches occasionally.   Review of Systems - Per HPI.   PMH - Abdominal pain, neck abscess, anxiety/depression, lumbar back pain, constipation, endometrial cancer, GERD, headache, obesity, unilateral sensorineural hearing loss, pleural effusion, OA both knees    Objective:  Physical Exam BP 129/87 mmHg  Pulse 80  Temp(Src) 98.1 F (36.7 C) (Oral)  Ht 5\' 6"  (1.676 m)  Wt 317 lb 14.4 oz (144.198 kg)  BMI 51.33 kg/m2  LMP 09/20/2012 GEN: NAD SKIN: Right neck with 48mmx1.5cm healed abscess with mild induration and erythema in center, well-healed appearing, no warmth or surrounding induration Neck: supple    Assessment:     AHNESTI TOWNSEND is a 48 y.o. female with h/o recent I&D for neck abscess on 10/18/14 here for follow up.    Plan:     # See problem list and after visit summary for problem-specific plans.   # Health Maintenance: Not discussed.  Follow-up: Follow up PRN if skin changes or fevers/chills develop.   Hilton Sinclair, MD [place]

## 2014-11-12 NOTE — Assessment & Plan Note (Signed)
Well-healed, mild post-inflammatory changes in very small area that is to be expected after I&D, no signs of active infection. Post-inflammatory hyperpigmentation in area of bandage. - Return PRN.

## 2014-11-12 NOTE — Patient Instructions (Signed)
Your abscess healed up very well. Use warm compresses 1-2 times daily. If it itches too bad, try small dose of benadryl. Follow up if fevers/chills or if this starts to look worse. Otherwise follow up with Dr Caryl Bis as planned.  Best,  Hilton Sinclair, MD

## 2014-11-30 ENCOUNTER — Other Ambulatory Visit: Payer: Self-pay | Admitting: Family Medicine

## 2014-11-30 DIAGNOSIS — C541 Malignant neoplasm of endometrium: Secondary | ICD-10-CM

## 2014-11-30 MED ORDER — POTASSIUM CHLORIDE ER 10 MEQ PO TBCR: EXTENDED_RELEASE_TABLET | ORAL | Status: DC

## 2014-11-30 NOTE — Telephone Encounter (Signed)
Pt called and needs a refill on her Potassium called in to the [facility] on [address]. jw

## 2014-12-07 ENCOUNTER — Other Ambulatory Visit: Payer: Self-pay | Admitting: Family Medicine

## 2014-12-07 DIAGNOSIS — D242 Benign neoplasm of left breast: Principal | ICD-10-CM

## 2014-12-07 DIAGNOSIS — D241 Benign neoplasm of right breast: Secondary | ICD-10-CM

## 2014-12-11 IMAGING — CR DG HIP (WITH OR WITHOUT PELVIS) 2-3V*L*
3 series · 3 of 3 positions shown · non-contrast
Comparison: None.

CLINICAL DATA: Back pain, hip pain

LEFT HIP - COMPLETE 2+ VIEW

[t pelvis ap]
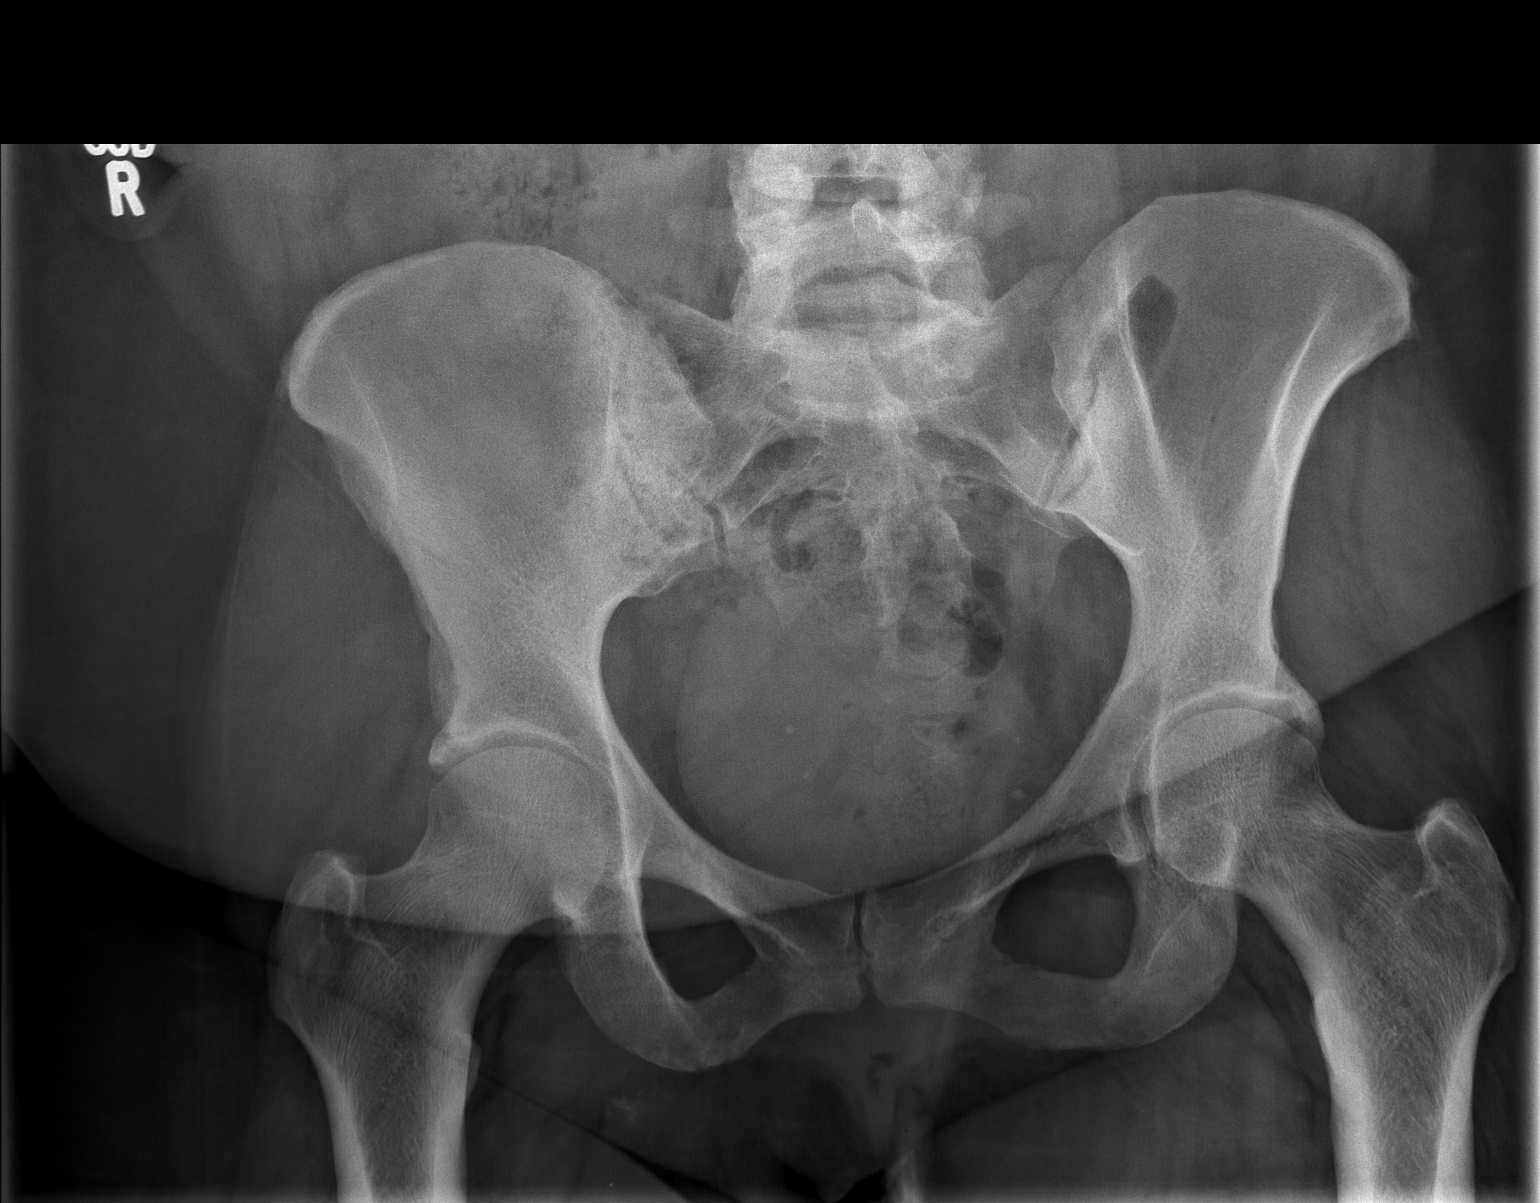

[t hip ap left]
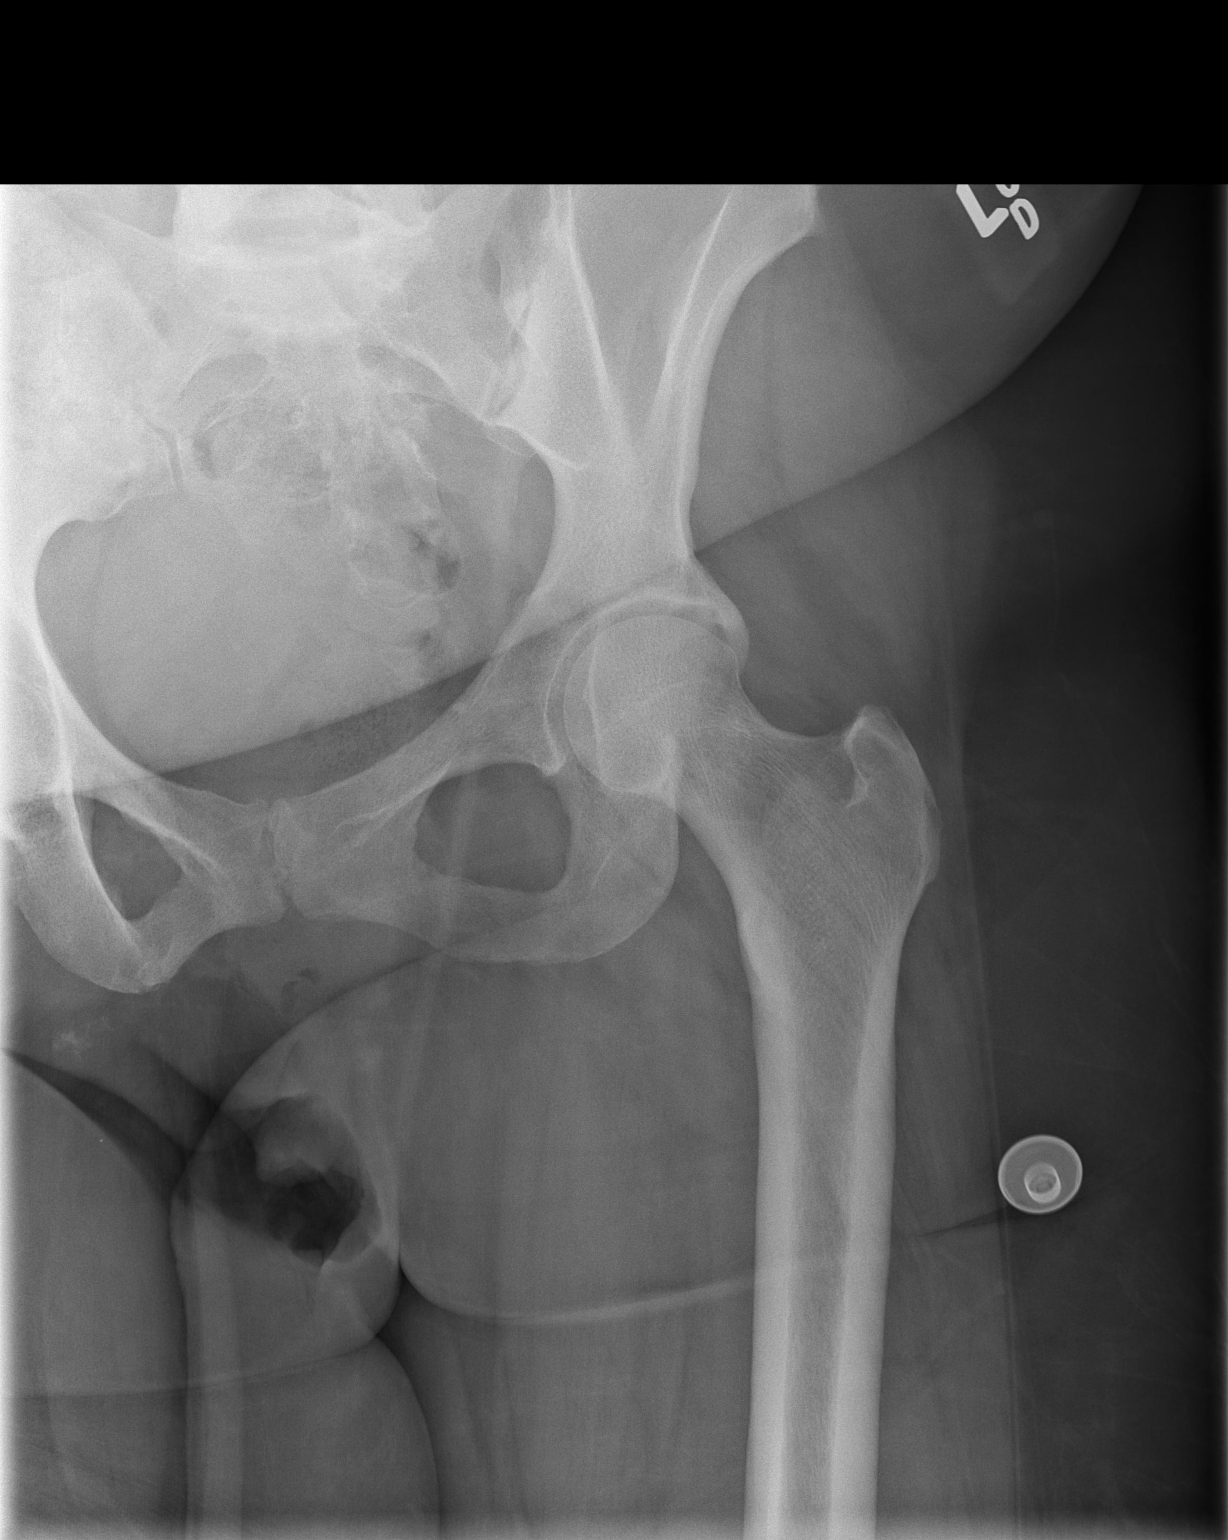

[t hip frog leg left]
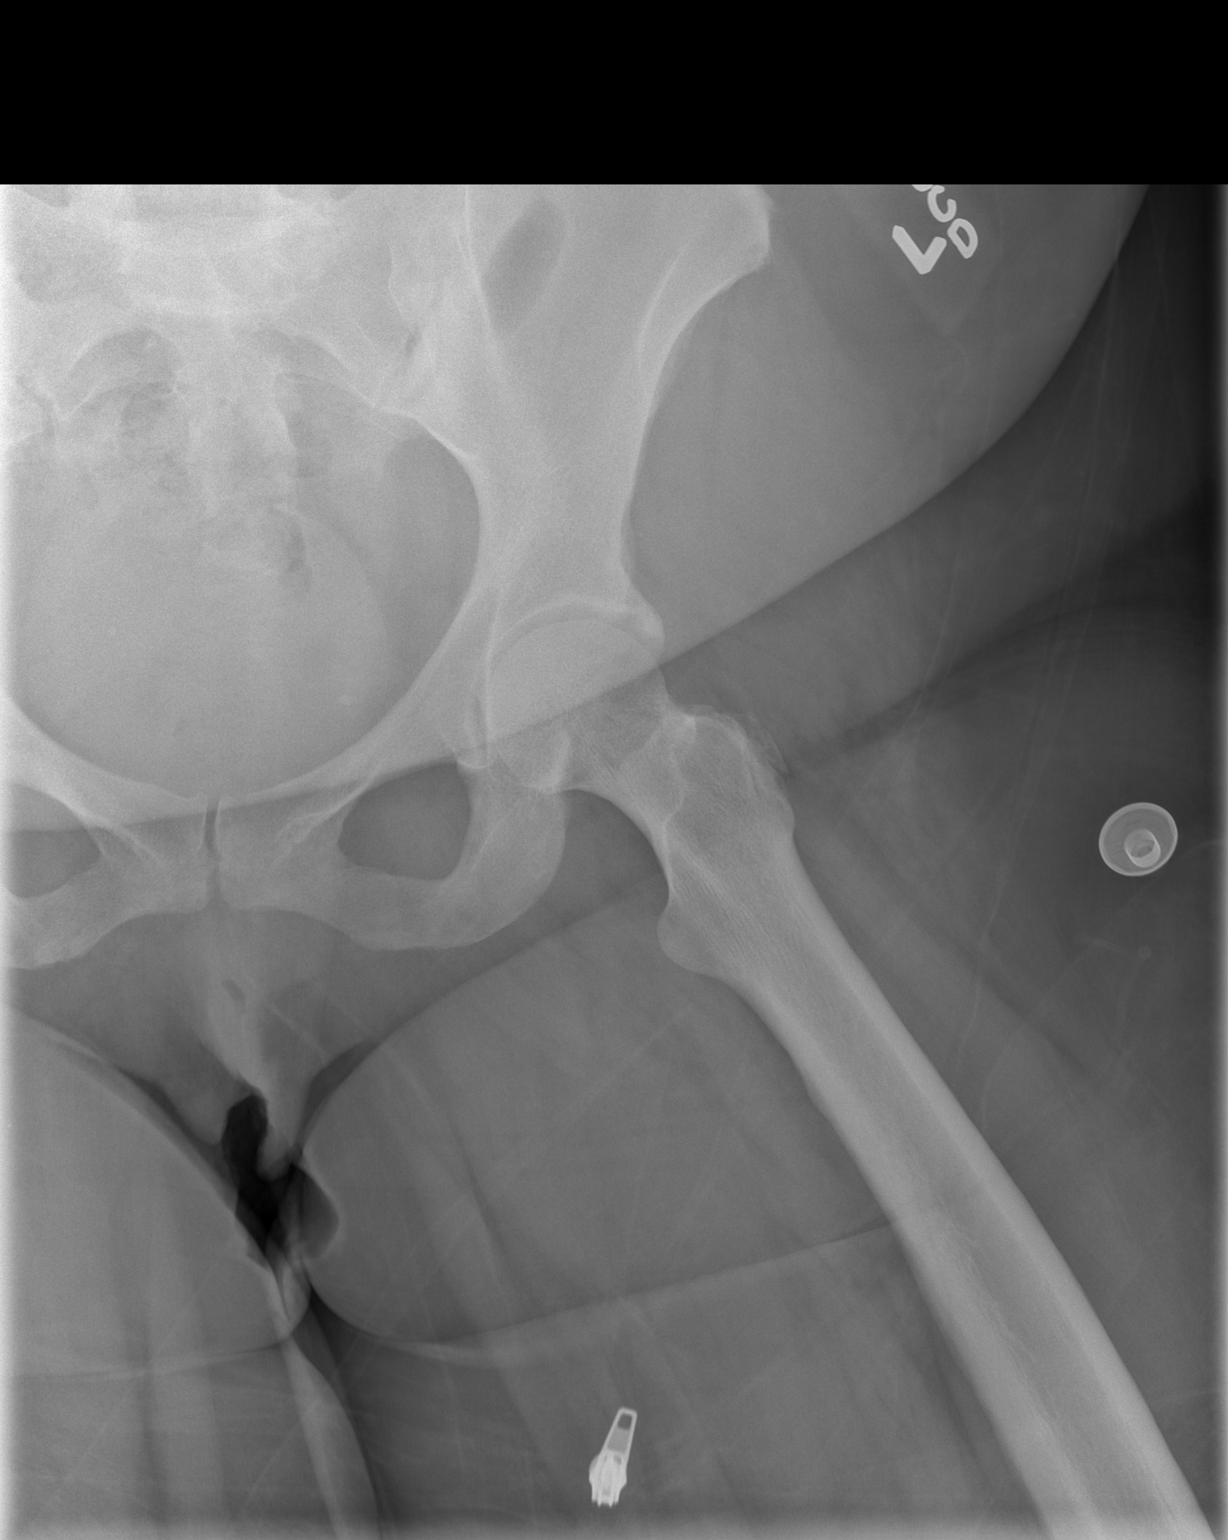

[3 of 3 positions shown; findings below may reference images not displayed]

FINDINGS: Three views of the left hip submitted.  No acute fracture
or subluxation.  Pelvic phleboliths are noted.
IMPRESSION: No acute fracture or subluxation.

## 2014-12-11 IMAGING — CR DG LUMBAR SPINE COMPLETE 4+V
5 series · 5 of 5 positions shown · non-contrast
Comparison: None.

CLINICAL DATA: Back pain, endometrial CA

LUMBAR SPINE - COMPLETE 4+ VIEW

[t lumbar spine ap]
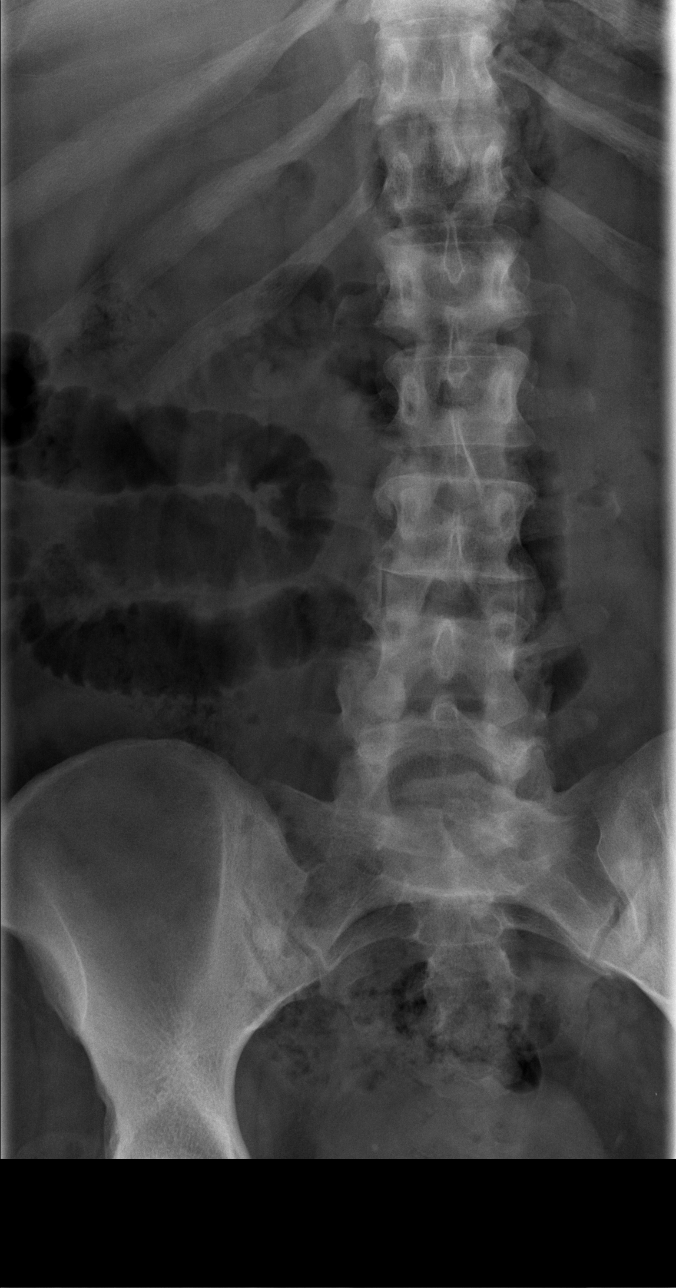

[t lumbar spine obl]
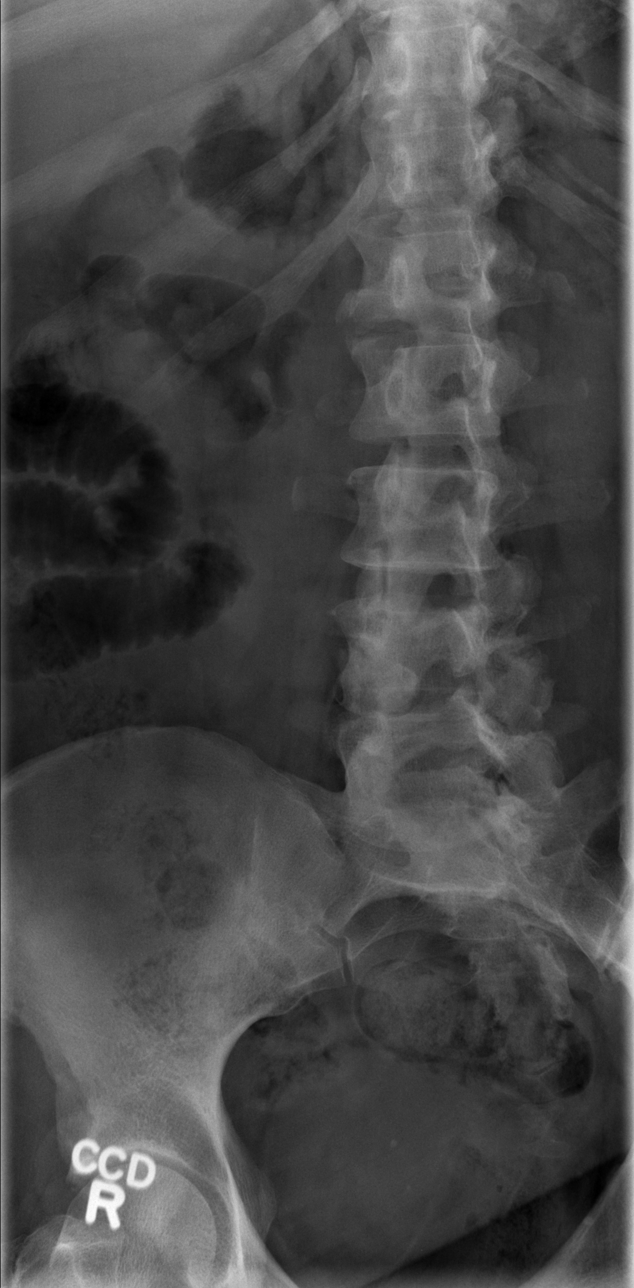

[t lumbar spine lat (1 of 2)]
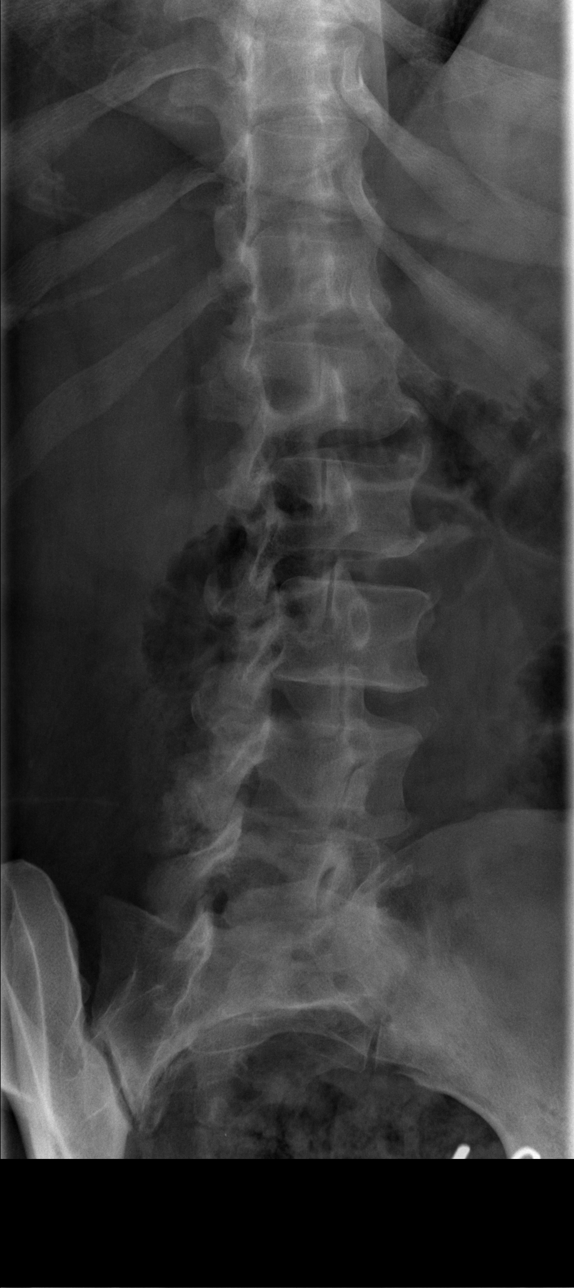

[t lumbar spine lat (2 of 2)]
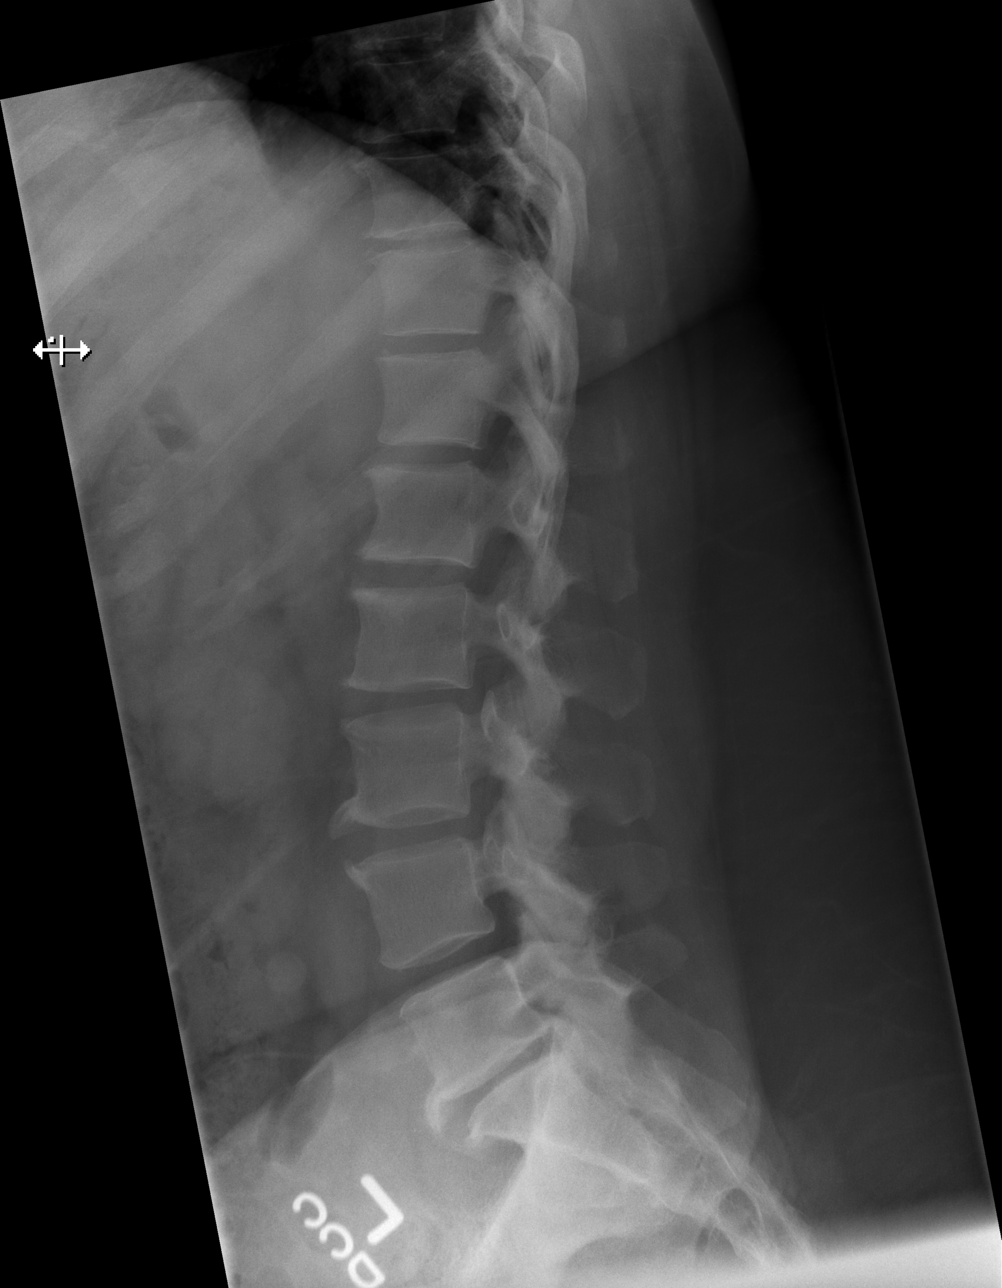

[t lumbar l-5 s-1 spot]
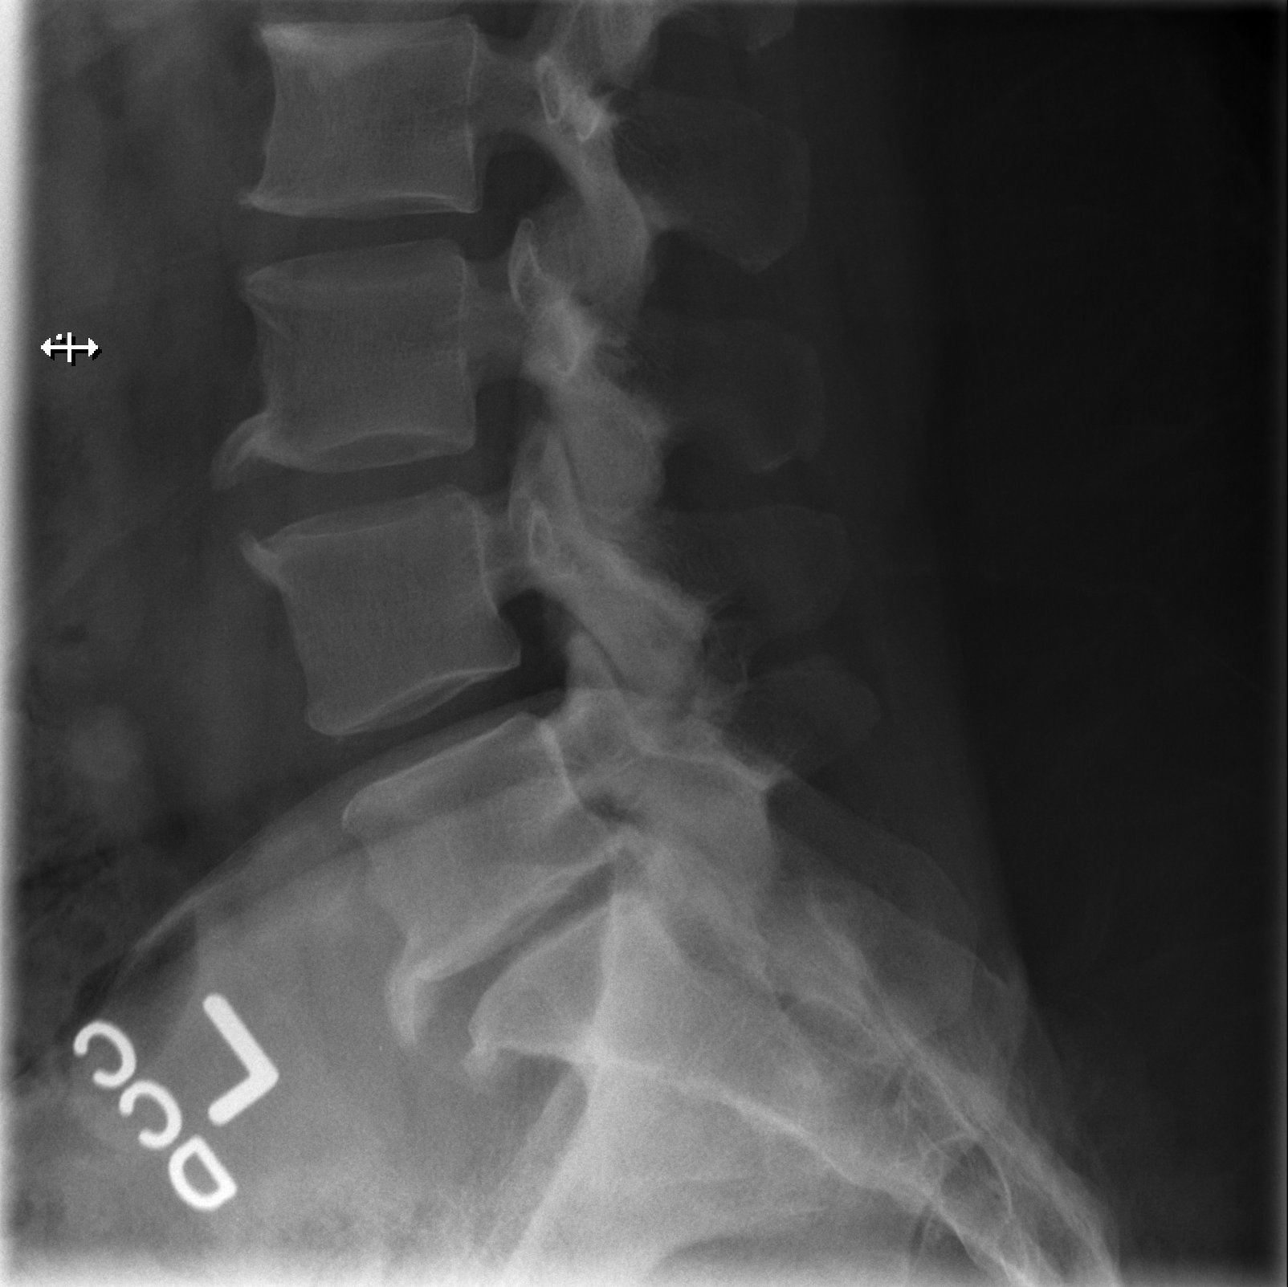

[5 of 5 positions shown; findings below may reference images not displayed]

FINDINGS: Five views of the lumbar spine submitted.  Moderate
anterior spurring noted at L3-L4 level. Moderate anterior spurring
and mild disc space flattening at L5 S1 level.  Mild anterior
spurring at L1 - L2 level.  Alignment and vertebral heights are
preserved.
IMPRESSION: No acute fracture or subluxation.  Degenerative changes as
described above.

## 2015-01-05 ENCOUNTER — Telehealth: Payer: Self-pay | Admitting: Gynecologic Oncology

## 2015-01-05 NOTE — Telephone Encounter (Signed)
Office Visit:  GYN ONCOLOGY   CC: Endometrial cancer surveillance  HPI:49 y/o G2 P1 LNMP  06/2012.  Deborah [name] was referred to Dr. Hulan Fray who collected an endometrial biopsy and a endocervical biopsy. Both are positive for endometrial adenocarcinoma.  On 10/07/2012 she underwent robotic hysterectomy bilateral salpingo-oophorectomy. At the time of surgery multiple cystic lesions were noted throughout the entire pelvis that was suspicious for metastatic disease, as such lymph node dissection was not collected but these lesions were biopsied. Final pathology was notable for  1. Soft tissue, biopsy, right para-colic gutter - BENIGN PARA-COLIC GUTTER SOFT TISSUE WITH BENIGN CYST (4.0 CM). SEE COMMENT. - NEGATIVE FOR MALIGNANCY. 2. Uterus +/- tubes/ovaries, neoplastic - ENDOMETRIAL ADENOCARCINOMA, ENDOMETRIOID-TYPE, SEE COMMENT. - TUMOR INVADES LESS THAN 1/2 OF MYOMETRIAL THICKNESS.- NO LYMPHOVASCULAR INVASION IDENTIFIED. - TUMOR INVADES INTO LOWER UTERINE SEGMENT. - UTERINE ADENOMYOSIS. - BENIGN RIGHT AND LEFT OVARIES; NO ATYPIA OR MALIGNANCY PRESENT. - BENIGN RIGHT AND LEFT FALLOPIAN TUBES WITH BENIGN SEROUS-TYPE PARATUBAL CYSTS. - BENIGN CERVIX; NEGATIVE FOR INTRAEPITHELIAL LESION OR MALIGNANCY. - BENIGN MULTILOCULAR UTERINE SEROSAL CYST.  CT abdomen and pelvis 11/25/2012  IMPRESSION:  1. Shotty bilateral external iliac lymphadenopathy measuring up to 1.3 cm, suspicious for metastatic disease. Nonspecific less than 5 mm retroperitoneal lymph nodes also noted in the left para-aortic region. 2. 2.7 cm postop lymphocele versus low attenuation lymphadenopathy in the proximal left external iliac chain. 3. Cholelithiasis and right nephrolithiasis incidentally noted.   End of treatment CT 08/25/2013 notable for no evidence of abdominal lymphadenopathy. There Is no evidence of inflammatory process abscess or ascites the adnexal regions are unremarkable in appearance there is no new or progressive disease  within the abdomen or pelvis  Reports weight gain since end of treatment.  Is doing well.  Alopecia has resolved.  Pap 09/23/2014 wnl  Past Surgical Hx:  Past Surgical History  Procedure Laterality Date  . Hiatal hernia repair    . Repair vaginal cuff  10/07/2012    Procedure: REPAIR VAGINAL CUFF;  Surgeon: [name], MD PHD;  Location: [facility];  Service: Gynecology;;  . Robotic assisted total hysterectomy with bilateral salpingo oopherectomy  10/07/2012    Procedure: ROBOTIC ASSISTED TOTAL HYSTERECTOMY WITH BILATERAL SALPINGO OOPHORECTOMY;  Surgeon: [name], MD PHD;  Location: [facility];  Service: Gynecology;  Laterality: Bilateral;  . Abdominal hysterectomy  10/2011    complete    Past Medical Hx:  Past Medical History  Diagnosis Date  . Congenital birth defect     Right hand  . Anemia   . Arthritis     knees  . Endometrial cancer   . Anxiety   . Swelling     ANKLES - TAKES LASIX  . Depression   . HLD (hyperlipidemia)     borderline  . Obesity   . Hx of radiation therapy 04/07/13- 05/14/13    pelvis 45 gray 25 fx  . Hx of echocardiogram     Echo (9/15): EF 50-55%, normal wall motion, grade 2 diastolic dysfunction, mild LAE    Past Gynecological History:  G2 P1 Menarche 15, Patient's last menstrual period was 09/20/2012. No h/o abn pap test  Family Hx:  Family History  Problem Relation Age of Onset  . Pancreatic cancer Maternal Grandfather     or liver cancer  . Hypertension Maternal Grandfather   . Cancer Maternal Grandfather     pancreatic  . Hypertension Maternal Grandmother   . Diabetes Mother   . Colon polyps Maternal Aunt   .  Cancer Maternal Aunt     lung  . Colon cancer Cousin   . Cancer Cousin     lung  . Lung cancer Maternal Aunt   . Heart attack Neg Hx   . Stroke Cousin    Mat aunt lung cancer Dx 26 (tob use) Mat GF pancreatic cancer Mat cousin lung cancer dx 72's (tob use)  Review of Systems:  Constitutional  Feels well, weight  gain, no malaise. Cardiovascular  No chest pain, shortness of breath, or edema  Pulmonary  No cough or wheeze.  Gastro Intestinal  No nausea, vomitting, or diarrhoea. No rectal bleeding. Genito Urinary  No frequency, urgency, dysuria,no vaginal spotting or discharge.  Is compliant with the use of her vaginal dilator Musculo Skeletal  No myalgia, arthralgia, joint swelling or pain   Physical Exam: BP 122/70  Pulse 94  Temp(Src) 98.6 F (37 C)  Resp 18  Ht 5\' 4"  (1.626 m)  Wt 302 lb 12.8 oz (137.349 kg)  BMI 51.95 kg/m2  LMP 09/20/2012 Wt Readings from Last 3 Encounters:  11/12/14 317 lb 14.4 oz (144.198 kg)  10/20/14 317 lb (143.79 kg)  10/18/14 318 lb (144.244 kg)    WD in NAD CHEST:  CTA CARDIAC:  RRR ABDOMEN :  Soft nontender. Trocar sites without evidence of mass or hernia.Normoactive bowel sounds, abdomen soft, non-tender and morbidly obese.  BACK:  No CVAT LN:  No cervical supra clavicular or inguinal adenopathy PELVIC:  Nl EGBUS, Vaginal cuff intact.  No discharge, bleeding or tenderness or cul de sac nodularity Rectal"  Good tone no masses EXT:  Missing digits on the left hand.  No lower extremity edema or cyanosis.  Assessment/Plan:  Deborah [name]  is a 49 y.o.  year old with grade 2 endometrial adenocarcinoma noted on  the endometrial biopsy and endocervical curettage specimens. She underwent robotic total laparoscopic hysterectomy bilateral salpingo-oophorectomy. At the time of surgery diffuse miliary cystic-like lesions were noted in the cul-de-sac on on the rectosigmoid colon highly suspicious for metastatic disease. Because of these findings lymph node dissection was not performed. Final pathology was notable for grade 2 endometrioid endometrial adenocarcinoma with 12.5% myometrial invasion no lymphovascular space invasion no cervical involvement without confirmation of metastases in the eplic systs.    A CT scan of the abdomen and pelvis was collected to  assess the pelvic and periaortic lymph nodes since these were not surgically assessed.  The imaging was suspicios for pelvic LN involvement, stage IIIC1  She has completed chemoradiation sandwich therapy 07/24/2013.   F/U with Dr. Jaynie Crumble  02/2015 F/U with Gyn Onc  05/2015 Counseled on the signs and symptoms of recurrence

## 2015-01-06 ENCOUNTER — Telehealth: Payer: Self-pay | Admitting: *Deleted

## 2015-01-06 ENCOUNTER — Ambulatory Visit: Payer: Medicaid Other | Admitting: Gynecologic Oncology

## 2015-01-06 NOTE — Telephone Encounter (Signed)
Pt called cancelled appt today states " my stomach is upset, I'm having diarrhea and I am afraid to try and go anywhere." Pt advised she thought it was due to something she ate, has immodium on hand and it's helping. Encouraged fluids. Pt declined to r/s appt with different provider. Appt cancelled & r/s.

## 2015-01-31 ENCOUNTER — Other Ambulatory Visit: Payer: Self-pay | Admitting: Family Medicine

## 2015-01-31 MED ORDER — POTASSIUM CHLORIDE CRYS ER 10 MEQ PO TBCR: EXTENDED_RELEASE_TABLET | ORAL | Status: DC

## 2015-01-31 NOTE — Telephone Encounter (Signed)
Pt called and needs a refill on her Potasium called in. jw

## 2015-01-31 NOTE — Telephone Encounter (Signed)
Refill given. Will need to follow-up in the next month. Thanks.

## 2015-02-01 NOTE — Telephone Encounter (Signed)
Letter mailed. Valmai [place],CMA  

## 2015-02-05 ENCOUNTER — Emergency Department (HOSPITAL_COMMUNITY)
Admission: EM | Admit: 2015-02-05 | Discharge: 2015-02-05 | Disposition: A | Discharge status: Home / Self Care | Payer: Medicaid Other | Attending: Emergency Medicine | Admitting: Emergency Medicine

## 2015-02-05 ENCOUNTER — Emergency Department (HOSPITAL_COMMUNITY): Payer: Medicaid Other

## 2015-02-05 ENCOUNTER — Encounter (HOSPITAL_COMMUNITY): Payer: Self-pay | Admitting: Emergency Medicine

## 2015-02-05 DIAGNOSIS — F419 Anxiety disorder, unspecified: Secondary | ICD-10-CM | POA: Diagnosis not present

## 2015-02-05 DIAGNOSIS — Z90710 Acquired absence of both cervix and uterus: Secondary | ICD-10-CM | POA: Insufficient documentation

## 2015-02-05 DIAGNOSIS — Z7951 Long term (current) use of inhaled steroids: Secondary | ICD-10-CM | POA: Insufficient documentation

## 2015-02-05 DIAGNOSIS — K8020 Calculus of gallbladder without cholecystitis without obstruction: Secondary | ICD-10-CM | POA: Insufficient documentation

## 2015-02-05 DIAGNOSIS — R1011 Right upper quadrant pain: Secondary | ICD-10-CM

## 2015-02-05 DIAGNOSIS — Z8542 Personal history of malignant neoplasm of other parts of uterus: Secondary | ICD-10-CM | POA: Diagnosis not present

## 2015-02-05 DIAGNOSIS — E669 Obesity, unspecified: Secondary | ICD-10-CM | POA: Insufficient documentation

## 2015-02-05 DIAGNOSIS — Z79899 Other long term (current) drug therapy: Secondary | ICD-10-CM | POA: Insufficient documentation

## 2015-02-05 DIAGNOSIS — Z9889 Other specified postprocedural states: Secondary | ICD-10-CM | POA: Diagnosis not present

## 2015-02-05 DIAGNOSIS — F329 Major depressive disorder, single episode, unspecified: Secondary | ICD-10-CM | POA: Insufficient documentation

## 2015-02-05 DIAGNOSIS — M1990 Unspecified osteoarthritis, unspecified site: Secondary | ICD-10-CM | POA: Diagnosis not present

## 2015-02-05 DIAGNOSIS — Z7982 Long term (current) use of aspirin: Secondary | ICD-10-CM | POA: Diagnosis not present

## 2015-02-05 DIAGNOSIS — Z923 Personal history of irradiation: Secondary | ICD-10-CM | POA: Diagnosis not present

## 2015-02-05 DIAGNOSIS — D649 Anemia, unspecified: Secondary | ICD-10-CM | POA: Diagnosis not present

## 2015-02-05 LAB — COMPREHENSIVE METABOLIC PANEL
ALT: 20 U/L (ref 0–35)
ANION GAP: 7 (ref 5–15)
AST: 16 U/L (ref 0–37)
Albumin: 4 g/dL (ref 3.5–5.2)
Alkaline Phosphatase: 64 U/L (ref 39–117)
BILIRUBIN TOTAL: 0.4 mg/dL (ref 0.3–1.2)
BUN: 19 mg/dL (ref 6–23)
CHLORIDE: 98 mmol/L (ref 96–112)
CO2: 29 mmol/L (ref 19–32)
Calcium: 9 mg/dL (ref 8.4–10.5)
Creatinine, Ser: 0.81 mg/dL (ref 0.50–1.10)
GFR calc Af Amer: >90 mL/min (ref >90)
GFR calc non Af Amer: 86 mL/min — ABNORMAL LOW (ref >90)
Glucose, Bld: 112 mg/dL — ABNORMAL HIGH (ref 70–99)
Potassium: 3.2 mmol/L — ABNORMAL LOW (ref 3.5–5.1)
SODIUM: 134 mmol/L — AB (ref 135–145)
Total Protein: 8.4 g/dL — ABNORMAL HIGH (ref 6.0–8.3)

## 2015-02-05 LAB — CBC
HEMATOCRIT: 41.3 % (ref 36.0–46.0)
Hemoglobin: 13.5 g/dL (ref 12.0–15.0)
MCH: 30.8 pg (ref 26.0–34.0)
MCHC: 32.7 g/dL (ref 30.0–36.0)
MCV: 94.3 fL (ref 78.0–100.0)
PLATELETS: 287 10*3/uL (ref 150–400)
RBC: 4.38 MIL/uL (ref 3.87–5.11)
RDW: 13.9 % (ref 11.5–15.5)
WBC: 8 10*3/uL (ref 4.0–10.5)

## 2015-02-05 LAB — LIPASE, BLOOD: LIPASE: 17 U/L (ref 11–59)

## 2015-02-05 MED ORDER — ONDANSETRON HCL 4 MG/2ML IJ SOLN
4.0000 mg | Freq: Once | INTRAMUSCULAR | Status: AC
  Administered 2015-02-05: 4 mg via INTRAVENOUS
  Filled 2015-02-05: qty 2

## 2015-02-05 MED ORDER — GI COCKTAIL ~~LOC~~
30.0000 mL | Freq: Once | ORAL | Status: AC
  Administered 2015-02-05: 30 mL via ORAL
  Filled 2015-02-05: qty 30

## 2015-02-05 MED ORDER — HYDROMORPHONE HCL 1 MG/ML IJ SOLN
1.0000 mg | Freq: Once | INTRAMUSCULAR | Status: AC
Start: 2015-02-05 — End: 2015-02-05
  Administered 2015-02-05: 1 mg via INTRAVENOUS
  Filled 2015-02-05: qty 1

## 2015-02-05 MED ORDER — OXYCODONE-ACETAMINOPHEN 5-325 MG PO TABS: 1.0000 | ORAL_TABLET | Freq: Four times a day (QID) | ORAL | Status: DC | PRN

## 2015-02-05 NOTE — ED Notes (Signed)
Pt went to [facility] to provide urine but missed the cup

## 2015-02-05 NOTE — ED Notes (Signed)
I tried to get blood but was unsucessful

## 2015-02-05 NOTE — ED Notes (Signed)
Pt reports pain in abdomin, radiating to back starting around 11pm last night. Pt denies nausea vomiting and reports the pain takes her breath away.

## 2015-02-05 NOTE — ED Notes (Signed)
Charge RN attempted IV access x1 w/o success. IV team will be consulted

## 2015-02-05 NOTE — ED Notes (Signed)
Jen H at bedside for ultrasound IV

## 2015-02-05 NOTE — ED Provider Notes (Signed)
CSN: [id number]     Arrival date & time 02/05/15  0143 History   First MD Initiated Contact with Patient 02/05/15 [phone]     Chief Complaint  Patient presents with  . Abdominal Pain     (Consider location/radiation/quality/duration/timing/severity/associated sxs/prior Treatment) Patient is a 49 y.o. female presenting with abdominal pain. The history is provided by the patient.  Abdominal Pain Pain location:  R flank and RUQ (R sided) Pain quality: aching   Pain radiates to:  Does not radiate Pain severity:  Moderate Onset quality:  Gradual Duration:  6 hours Timing:  Constant Progression:  Worsening Chronicity:  New Context: eating (began a few hours after eating a large meal)   Context: not diet changes, not previous surgeries, not recent illness, not recent sexual activity and not trauma   Relieved by: mild relief with going to the bathroom and walking around. Worsened by:  Nothing tried Associated symptoms: nausea   Associated symptoms: no chest pain, no cough, no diarrhea, no dysuria, no fever, no shortness of breath and no vomiting     Past Medical History  Diagnosis Date  . Congenital birth defect     Right hand  . Anemia   . Arthritis     knees  . Endometrial cancer   . Anxiety   . Swelling     ANKLES - TAKES LASIX  . Depression   . HLD (hyperlipidemia)     borderline  . Obesity   . Hx of radiation therapy 04/07/13- 05/14/13    pelvis 45 gray 25 fx  . Hx of echocardiogram     Echo (9/15): EF 50-55%, normal wall motion, grade 2 diastolic dysfunction, mild LAE   Past Surgical History  Procedure Laterality Date  . Hiatal hernia repair    . Repair vaginal cuff  10/07/2012    Procedure: REPAIR VAGINAL CUFF;  Surgeon: [name], MD PHD;  Location: [facility];  Service: Gynecology;;  . Robotic assisted total hysterectomy with bilateral salpingo oopherectomy  10/07/2012    Procedure: ROBOTIC ASSISTED TOTAL HYSTERECTOMY WITH BILATERAL SALPINGO OOPHORECTOMY;  Surgeon:  [name], MD PHD;  Location: [facility];  Service: Gynecology;  Laterality: Bilateral;  . Abdominal hysterectomy  10/2011    complete   Family History  Problem Relation Age of Onset  . Pancreatic cancer Maternal Grandfather     or liver cancer  . Hypertension Maternal Grandfather   . Cancer Maternal Grandfather     pancreatic  . Hypertension Maternal Grandmother   . Diabetes Mother   . Colon polyps Maternal Aunt   . Cancer Maternal Aunt     lung  . Colon cancer Cousin   . Cancer Cousin     lung  . Lung cancer Maternal Aunt   . Heart attack Neg Hx   . Stroke Cousin    History  Substance Use Topics  . Smoking status: Never Smoker   . Smokeless tobacco: Never Used  . Alcohol Use: Yes     Comment: rare   OB History    Gravida Para Term Preterm AB TAB SAB Ectopic Multiple Living   2 1 0 1 1 0 1 0 0 1      Review of Systems  Constitutional: Negative for fever.  Respiratory: Negative for cough and shortness of breath.   Cardiovascular: Negative for chest pain and leg swelling.  Gastrointestinal: Positive for nausea and abdominal pain. Negative for vomiting and diarrhea.  Genitourinary: Negative for dysuria.  All other systems reviewed and  are negative.     Allergies  Review of patient's allergies indicates no known allergies.  Home Medications   Prior to Admission medications   Medication Sig Start Date End Date Taking? Authorizing Provider  aspirin 81 MG tablet Take 81 mg by mouth every morning.    Yes Historical Provider, MD  bifidobacterium infantis (ALIGN) capsule Take 1 capsule by mouth daily as needed. 10/03/12  Yes Jerene Bears, MD  carvedilol (COREG) 3.125 MG tablet Take 1 tablet in the am and 2 in the pm. 08/19/14  Yes Minus Breeding, MD  diazepam (VALIUM) 5 MG tablet Take 5 mg by mouth every 8 (eight) hours as needed for anxiety (anxiety). Disp. 30 tablets. 3 refills.  Called in to [facility] on [facility] per Dr. Sondra Come. 10/13/14  Yes Gery Pray, MD   furosemide (LASIX) 20 MG tablet Take 1 tablet (20 mg total) by mouth daily. Patient taking differently: Take 20 mg by mouth daily as needed for fluid.  10/01/14  Yes Leeanne Rio, MD  magnesium chloride (SLOW-MAG) 64 MG TBEC SR tablet Take 2 tablets by mouth 2 (two) times daily. 12/02/13  Yes Leone Haven, MD  potassium chloride (K-DUR) 10 MEQ tablet Take two tablets twice a day or as directed. Patient taking differently: Take 20 mEq by mouth 2 (two) times daily. Take two tablets twice a day or as directed. 11/30/14  Yes Leone Haven, MD  sertraline (ZOLOFT) 100 MG tablet take 1 tablet by mouth once daily Patient taking differently: Take 100 mg by mouth daily. take 1 tablet by mouth once daily 06/02/14  Yes Leone Haven, MD  acetaminophen (TYLENOL) 500 MG tablet Take 1,000 mg by mouth every 6 (six) hours as needed for moderate pain (pain).    Historical Provider, MD  cyclobenzaprine (FLEXERIL) 10 MG tablet Take 1 tablet (10 mg total) by mouth 3 (three) times daily as needed for muscle spasms. Patient not taking: Reported on 02/05/2015 09/29/14   Evelina Bucy, MD  docusate sodium (COLACE) 50 MG capsule Take 50 mg by mouth daily as needed for mild constipation.  11/09/13   Leone Haven, MD  ferrous sulfate 325 (65 FE) MG tablet 325 mg every evening. Takes twice a week    Historical Provider, MD  HYDROcodone-acetaminophen (NORCO/VICODIN) 5-325 MG per tablet Take 1 tablet by mouth every 6 (six) hours as needed for moderate pain. Patient not taking: Reported on 02/05/2015 09/29/14   Evelina Bucy, MD  ibuprofen (ADVIL,MOTRIN) 800 MG tablet Take 1 tablet (800 mg total) by mouth every 8 (eight) hours as needed. Patient not taking: Reported on 02/05/2015 09/29/14   Evelina Bucy, MD  lactase (LACTAID) 3000 UNITS tablet Take 1 tablet by mouth 3 (three) times daily with meals as needed.  10/03/12   Jerene Bears, MD  loperamide (IMODIUM) 2 MG capsule Take 2 mg by mouth 4 (four) times daily as  needed for diarrhea or loose stools (diarrhea).     Historical Provider, MD  loratadine (CLARITIN) 10 MG tablet Take 1 tablet (10 mg total) by mouth daily. Patient taking differently: Take 10 mg by mouth daily as needed for allergies (allergies).  11/09/13   Leone Haven, MD  mometasone (NASONEX) 50 MCG/ACT nasal spray Place 2 sprays into the nose daily. One spray in each nostril. Patient taking differently: Place 2 sprays into the nose as needed (nasal congestion). One spray in each nostril. 11/09/13   Leone Haven, MD  OVER  THE COUNTER MEDICATION Allergy Plus Sinus Headache- acetaminophen 325, diphenhydramine 12.5, phenylephrine 5 mg    Historical Provider, MD  OVER THE COUNTER MEDICATION Take 5 mg by mouth as needed (ALLERGY / SINUS). Medi-phenyl 5 mg    Historical Provider, MD  polyethylene glycol powder (GLYCOLAX/MIRALAX) powder Take 17 g by mouth daily as needed. 11/10/13   Leone Haven, MD  potassium chloride (K-DUR,KLOR-CON) 10 MEQ tablet take 2 tablets by mouth twice a day or as directed 01/31/15   Leone Haven, MD   BP 122/54 mmHg  Pulse 84  Temp(Src) 98.6 F (37 C) (Oral)  Resp 19  Ht 5\' 6"  (1.676 m)  Wt 318 lb (144.244 kg)  BMI 51.35 kg/m2  SpO2 100%  LMP 09/20/2012 Physical Exam  Constitutional: She is oriented to person, place, and time. She appears well-developed and well-nourished. No distress.  HENT:  Head: Normocephalic and atraumatic.  Mouth/Throat: Oropharynx is clear and moist.  Eyes: EOM are normal. Pupils are equal, round, and reactive to light.  Neck: Normal range of motion. Neck supple.  Cardiovascular: Normal rate and regular rhythm.  Exam reveals no friction rub.   No murmur heard. Pulmonary/Chest: Effort normal and breath sounds normal. No respiratory distress. She has no wheezes. She has no rales.  Abdominal: Soft. She exhibits no distension. There is tenderness in the right upper quadrant, epigastric area and periumbilical area. There is  no rigidity, no rebound, no guarding and no tenderness at McBurney's point.  Musculoskeletal: Normal range of motion. She exhibits no edema.  Neurological: She is alert and oriented to person, place, and time.  Skin: She is not diaphoretic.  Nursing note and vitals reviewed.   ED Course  Procedures (including critical care time) Labs Review Labs Reviewed  CBC  COMPREHENSIVE METABOLIC PANEL  URINALYSIS, ROUTINE W REFLEX MICROSCOPIC  LIPASE, BLOOD    Imaging Review US Abdomen Limited  02/05/2015   CLINICAL DATA:  Right upper quadrant abdominal pain since last evening.  EXAM: US ABDOMEN LIMITED - RIGHT UPPER QUADRANT  COMPARISON:  CT abdomen pelvis - 08/25/2013  FINDINGS: Gallbladder:  Mole bowel echogenic stones, largest of which within the gallbladder fundus measures approximately 1.6 cm in diameter (image 11). Note is made of a trace amount of pericholecystic fluid (images 29 and 31). No gallbladder wall thickening. Negative sonographic Murphy's sign.  Common bile duct:  Diameter: Normal in size measuring 4 mm in diameter  Liver:  There is mild increased and slightly coarsened echogenicity of the hepatic parenchyma. No discrete hepatic lesions. No intrahepatic biliary ductal dilatation. No ascites.  IMPRESSION: Cholelithiasis was potential trace amount of pericholecystic fluid. Findings are nonspecific in the absence of gallbladder wall thickening or a positive sonographic Murphy's sign. If clinical concern persists for early acute cholecystitis, further evaluation could be performed with HIDA scan as indicated.   Electronically Signed   By: Sandi Mariscal M.D.   On: 02/05/2015 09:02     EKG Interpretation None      MDM   Final diagnoses:  RUQ pain  Calculus of gallbladder without cholecystitis without obstruction    28F here with abdominal pain. Began several hours after eating a meal of steak and chicken. Constant, very mild improvement with walking around and with going to the  bathroom, but no fully alleviating factors. No diarrhea. Mild associated nausea prior to coming to the ED, but no vomiting. No fever. Hx of hiatal hernia surgery and hysterectomy. Denies any pelvic or urinary complaints. Endorses R  flank pain and RUQ pain. Began in her flank, radiated forward.  On exam, stable vitals, clear lungs. RUQ and epigastric area tender. Mild R sided abdominal pain. Most pain center in RUQ area. Will begin with [place]. Will give GI cocktail and pain medicine for relief. Labs returned normal. Normal hepatic function tests. Normal white count. Right upper quadrant ultrasound shows cholelithiasis with trace pericholecystic fluid. No gallbladder wall thickening. She clinically has no Murphy sign, is afebrile, is very comfortable. I do not think she has acute cholecystitis. I sent a message to her primary care provider through [place] him of her need for possible HIDA scan. Patient stable for discharge.    Evelina Bucy, MD 02/06/15 [phone]

## 2015-02-05 NOTE — Discharge Instructions (Signed)
Cholelithiasis °Cholelithiasis (also called gallstones) is a form of gallbladder disease in which gallstones form in your gallbladder. The gallbladder is an organ that stores bile made in the liver, which helps digest fats. Gallstones begin as small crystals and slowly grow into stones. Gallstone pain occurs when the gallbladder spasms and a gallstone is blocking the duct. Pain can also occur when a stone passes out of the duct.  °RISK FACTORS °· Being female.   °· Having multiple pregnancies. Health care providers sometimes advise removing diseased gallbladders before future pregnancies.   °· Being obese. °· Eating a diet heavy in fried foods and fat.   °· Being older than 60 years and increasing age.   °· Prolonged use of medicines containing female hormones.   °· Having diabetes mellitus.   °· Rapidly losing weight.   °· Having a family history of gallstones (heredity).   °SYMPTOMS °· Nausea.   °· Vomiting. °· Abdominal pain.   °· Yellowing of the skin (jaundice).   °· Sudden pain. It may persist from several minutes to several hours. °· Fever.   °· Tenderness to the touch.  °In some cases, when gallstones do not move into the bile duct, people have no pain or symptoms. These are called "silent" gallstones.  °TREATMENT °Silent gallstones do not need treatment. In severe cases, emergency surgery may be required. Options for treatment include: °· Surgery to remove the gallbladder. This is the most common treatment. °· Medicines. These do not always work and may take 6-12 months or more to work. °· Shock wave treatment (extracorporeal biliary lithotripsy). In this treatment an ultrasound machine sends shock waves to the gallbladder to break gallstones into smaller pieces that can pass into the intestines or be dissolved by medicine. °HOME CARE INSTRUCTIONS  °· Only take over-the-counter or prescription medicines for pain, discomfort, or fever as directed by your health care provider.   °· Follow a low-fat diet until  seen again by your health care provider. Fat causes the gallbladder to contract, which can result in pain.   °· Follow up with your health care provider as directed. Attacks are almost always recurrent and surgery is usually required for permanent treatment.   °SEEK IMMEDIATE MEDICAL CARE IF:  °· Your pain increases and is not controlled by medicines.   °· You have a fever or persistent symptoms for more than 2-3 days.   °· You have a fever and your symptoms suddenly get worse.   °· You have persistent nausea and vomiting.   °MAKE SURE YOU:  °· Understand these instructions. °· Will watch your condition. °· Will get help right away if you are not doing well or get worse. °Document Released: 11/08/2005 Document Revised: 07/15/2013 Document Reviewed: 05/06/2013 °ExitCare® Patient Information ©2015 [employer]. This information is not intended to replace advice given to you by your health care provider. Make sure you discuss any questions you have with your health care provider. ° °

## 2015-02-05 NOTE — ED Notes (Signed)
Attempted to obtain IV x2 w/ no success. Charge RN made aware so that she may attempt

## 2015-02-05 NOTE — ED Notes (Signed)
Pt aware of the need for a urine sample. She reports that she attempt here shortly.

## 2015-02-08 ENCOUNTER — Encounter (INDEPENDENT_AMBULATORY_CARE_PROVIDER_SITE_OTHER): Payer: Self-pay

## 2015-02-08 ENCOUNTER — Other Ambulatory Visit: Payer: Self-pay | Admitting: Radiation Oncology

## 2015-02-08 ENCOUNTER — Ambulatory Visit
Admission: RE | Admit: 2015-02-08 | Discharge: 2015-02-08 | Disposition: A | Discharge status: Home / Self Care | Payer: Medicaid Other | Source: Ambulatory Visit | Attending: *Deleted | Admitting: *Deleted

## 2015-02-08 ENCOUNTER — Telehealth: Payer: Self-pay | Admitting: Oncology

## 2015-02-08 DIAGNOSIS — D241 Benign neoplasm of right breast: Secondary | ICD-10-CM

## 2015-02-08 DIAGNOSIS — D242 Benign neoplasm of left breast: Principal | ICD-10-CM

## 2015-02-08 MED ORDER — DIAZEPAM 5 MG PO TABS: 5.0000 mg | ORAL_TABLET | Freq: Three times a day (TID) | ORAL | Status: DC | PRN

## 2015-02-08 NOTE — Telephone Encounter (Signed)
Deborah [name] called and requested a refill on diazepam 5 mg tablets.  She last had it filled on 10/13/14.  She said that she has 1 tablet left.  Her next follow up with Dr. Sondra Come is on 03/10/15.

## 2015-02-08 NOTE — Telephone Encounter (Signed)
I will give her 10 tablets but she will have to discuss long term Valium prescriptions with Dr. Sondra Come at her next appointment.

## 2015-02-09 ENCOUNTER — Telehealth: Payer: Self-pay | Admitting: Oncology

## 2015-02-09 NOTE — Telephone Encounter (Signed)
It actually has to be printed.  It is on my desk.  Not sure if you want her to pick up or you want to call it in?

## 2015-02-09 NOTE — Telephone Encounter (Signed)
Called in refill per Dr. Pablo Ledger to [facility] for diazepam (VALIUM) 5 MG tablet. Take 1 tablet (5 mg total) by mouth every 8 (eight) hours as needed for anxiety (anxiety). Disp. 10 tablets. 0 refills.  Called Danica and let her know it had been called in.

## 2015-02-09 NOTE — Telephone Encounter (Signed)
Called Deborah [name] and let her know that a refill on Valium has been sent in to [facility].  [name] verbalized agreement.

## 2015-02-10 ENCOUNTER — Encounter: Payer: Self-pay | Admitting: Cardiology

## 2015-02-10 ENCOUNTER — Ambulatory Visit (INDEPENDENT_AMBULATORY_CARE_PROVIDER_SITE_OTHER): Payer: Medicaid Other | Admitting: Cardiology

## 2015-02-10 VITALS — BP 119/69 | HR 87 | Ht 66.0 in | Wt 319.0 lb

## 2015-02-10 DIAGNOSIS — R072 Precordial pain: Secondary | ICD-10-CM

## 2015-02-10 NOTE — Progress Notes (Signed)
HPI The patient presents for evaluation of a reduced EF.  [name] She was seen here in 2000 and and did have a stress perfusion study. This demonstrated possibly a reduced ejection fraction of 47%. There was artifact. There was perhaps some hypoperfusion of the anterior septum. Cardiac catheterization was planned but this did not happen. I saw her last year and I ordered dobutamine echocardiogram. This confirmed the ejection fraction was somewhat low at 35%. However, there was no evidence of ischemia or infarct. She was seen by Richardson Dopp PAc and we did try to add lisinopril to her regimen. However, she did not tolerate this. I increased her Coreg. She saw Nicki Reaper again in Sept.  An echocardiogram at that visit demonstrated the EF was now normal.   Since I last saw her she has done well.  The patient denies any new symptoms such as chest discomfort, neck or arm discomfort. There has been no new shortness of breath, PND or orthopnea. There have been no reported palpitations, presyncope or syncope.  She lives on the 3rd floor and takes the stairs with mild SOB but no other symptoms.     No Known Allergies  Current Outpatient Prescriptions  Medication Sig Dispense Refill  . acetaminophen (TYLENOL) 500 MG tablet Take 1,000 mg by mouth every 6 (six) hours as needed for moderate pain (pain).    [name] aspirin 81 MG tablet Take 81 mg by mouth every morning.     . bifidobacterium infantis (ALIGN) capsule Take 1 capsule by mouth daily as needed.    . carvedilol (COREG) 3.125 MG tablet Take 1 tablet in the am and 2 in the pm. 90 tablet 6  . cyclobenzaprine (FLEXERIL) 10 MG tablet Take 1 tablet (10 mg total) by mouth 3 (three) times daily as needed for muscle spasms. 30 tablet 0  . diazepam (VALIUM) 5 MG tablet Take 1 tablet (5 mg total) by mouth every 8 (eight) hours as needed for anxiety (anxiety). 10 tablet 0  . docusate sodium (COLACE) 50 MG capsule Take 50 mg by mouth daily as needed for mild constipation.     .  furosemide (LASIX) 20 MG tablet Take 1 tablet (20 mg total) by mouth daily. (Patient taking differently: Take 20 mg by mouth daily as needed for fluid. ) 30 tablet 0  . HYDROcodone-acetaminophen (NORCO/VICODIN) 5-325 MG per tablet Take 1 tablet by mouth every 6 (six) hours as needed for moderate pain. 10 tablet 0  . ibuprofen (ADVIL,MOTRIN) 800 MG tablet Take 1 tablet (800 mg total) by mouth every 8 (eight) hours as needed. 30 tablet 0  . lactase (LACTAID) 3000 UNITS tablet Take 1 tablet by mouth 3 (three) times daily with meals as needed.     . loperamide (IMODIUM) 2 MG capsule Take 2 mg by mouth 4 (four) times daily as needed for diarrhea or loose stools (diarrhea).     . magnesium chloride (SLOW-MAG) 64 MG TBEC SR tablet Take 2 tablets by mouth 2 (two) times daily.    . mometasone (NASONEX) 50 MCG/ACT nasal spray Place 2 sprays into the nose daily. One spray in each nostril. (Patient taking differently: Place 2 sprays into the nose as needed (nasal congestion). One spray in each nostril.) 17 g 0  . OVER THE COUNTER MEDICATION Allergy Plus Sinus Headache- acetaminophen 325, diphenhydramine 12.5, phenylephrine 5 mg    . OVER THE COUNTER MEDICATION Take 5 mg by mouth as needed (ALLERGY / SINUS). Medi-phenyl 5 mg    .  oxyCODONE-acetaminophen (PERCOCET) 5-325 MG per tablet Take 1 tablet by mouth every 6 (six) hours as needed for moderate pain. 20 tablet 0  . polyethylene glycol powder (GLYCOLAX/MIRALAX) powder Take 17 g by mouth daily as needed. 3350 g 1  . potassium chloride (K-DUR) 10 MEQ tablet Take two tablets twice a day or as directed. (Patient taking differently: Take 20 mEq by mouth 2 (two) times daily. Take two tablets twice a day or as directed.) 120 tablet 1  . potassium chloride (K-DUR,KLOR-CON) 10 MEQ tablet take 2 tablets by mouth twice a day or as directed 120 tablet 1  . sertraline (ZOLOFT) 100 MG tablet take 1 tablet by mouth once daily (Patient taking differently: Take 100 mg by mouth  daily. take 1 tablet by mouth once daily) 30 tablet 1   No current facility-administered medications for this visit.    Past Medical History  Diagnosis Date  . Congenital birth defect     Right hand  . Anemia   . Arthritis     knees  . Endometrial cancer   . Anxiety   . Swelling     ANKLES - TAKES LASIX  . Depression   . HLD (hyperlipidemia)     borderline  . Obesity   . Hx of radiation therapy 04/07/13- 05/14/13    pelvis 45 gray 25 fx  . Hx of echocardiogram     Echo (9/15): EF 50-55%, normal wall motion, grade 2 diastolic dysfunction, mild LAE    Past Surgical History  Procedure Laterality Date  . Hiatal hernia repair    . Repair vaginal cuff  10/07/2012    Procedure: REPAIR VAGINAL CUFF;  Surgeon: [name], MD PHD;  Location: [facility];  Service: Gynecology;;  . Robotic assisted total hysterectomy with bilateral salpingo oopherectomy  10/07/2012    Procedure: ROBOTIC ASSISTED TOTAL HYSTERECTOMY WITH BILATERAL SALPINGO OOPHORECTOMY;  Surgeon: [name], MD PHD;  Location: [facility];  Service: Gynecology;  Laterality: Bilateral;  . Abdominal hysterectomy  10/2011    complete    ROS:  As stated in the history of present illness and negative for all other systems.  PHYSICAL EXAM BP 119/69 mmHg  Pulse 87  Ht 5\' 6"  (1.676 m)  Wt 319 lb (144.697 kg)  BMI 51.51 kg/m2  LMP 09/20/2012 GENERAL:  Well appearing NECK:  No jugular venous distention, waveform within normal limits, carotid upstroke brisk and symmetric, no bruits, no thyromegaly LUNGS:  Clear to auscultation bilaterally HEART:  PMI not displaced or sustained,S1 and S2 within normal limits, no S3, no S4, possible click, no rubs, no murmurs ABD:  Flat, positive bowel sounds normal in frequency in pitch, no bruits, no rebound, no guarding, no midline pulsatile mass, no hepatomegaly, no splenomegaly EXT:  2 plus pulses throughout, no edema, no cyanosis no clubbing, right hand congenital  abnormality   ASSESSMENT AND PLAN  Chest pain - She has had no new symptoms.  She has no evidence of ischemia on her previous stress test. Therefore, no further workup is suggested.  Cardiomyopathy -  She seems to be tolerating the low-dose beta blocker but barely. Her EF is better.  She will stay on the same meds.

## 2015-02-10 NOTE — Patient Instructions (Signed)
Your physician recommends that you schedule a follow-up appointment in: 18 months with Dr. Hochrein  

## 2015-02-15 ENCOUNTER — Ambulatory Visit (INDEPENDENT_AMBULATORY_CARE_PROVIDER_SITE_OTHER): Payer: Medicaid Other | Admitting: Family Medicine

## 2015-02-15 ENCOUNTER — Encounter: Payer: Self-pay | Admitting: Family Medicine

## 2015-02-15 VITALS — BP 126/76 | HR 66 | Temp 98.3°F | Ht 66.0 in | Wt 317.5 lb

## 2015-02-15 DIAGNOSIS — M25562 Pain in left knee: Secondary | ICD-10-CM

## 2015-02-15 DIAGNOSIS — K8020 Calculus of gallbladder without cholecystitis without obstruction: Secondary | ICD-10-CM

## 2015-02-15 DIAGNOSIS — G4730 Sleep apnea, unspecified: Secondary | ICD-10-CM

## 2015-02-15 DIAGNOSIS — E876 Hypokalemia: Secondary | ICD-10-CM

## 2015-02-15 DIAGNOSIS — G44209 Tension-type headache, unspecified, not intractable: Secondary | ICD-10-CM

## 2015-02-15 NOTE — Patient Instructions (Signed)
Nice to see you. Please go get the x-ray of your knee.  Someone will call you to set up the sleep study and surgeons appointment.  If you develop abdominal pain, fever, nausea, vomiting, diarrhea, worsening knee pain, swelling or warmth please seek medical attention.

## 2015-02-16 ENCOUNTER — Telehealth: Payer: Self-pay | Admitting: Oncology

## 2015-02-16 ENCOUNTER — Other Ambulatory Visit: Payer: Medicaid Other

## 2015-02-16 ENCOUNTER — Other Ambulatory Visit: Payer: Self-pay | Admitting: Oncology

## 2015-02-16 ENCOUNTER — Ambulatory Visit: Payer: Medicaid Other | Admitting: Oncology

## 2015-02-16 LAB — MAGNESIUM: Magnesium: 1.4 mg/dL — ABNORMAL LOW (ref 1.5–2.5)

## 2015-02-16 LAB — BASIC METABOLIC PANEL
BUN: 16 mg/dL (ref 6–23)
CO2: 28 meq/L (ref 19–32)
Calcium: 9.1 mg/dL (ref 8.4–10.5)
Chloride: 98 mEq/L (ref 96–112)
Creat: 0.78 mg/dL (ref 0.50–1.10)
Glucose, Bld: 102 mg/dL — ABNORMAL HIGH (ref 70–99)
Potassium: 4.4 mEq/L (ref 3.5–5.3)
Sodium: 137 mEq/L (ref 135–145)

## 2015-02-16 NOTE — Telephone Encounter (Signed)
Deborah [name] called asking for a refill on diazepam.  She has 4 tablets left.  She has a follow up scheduled 03/10/15.

## 2015-02-16 NOTE — Telephone Encounter (Signed)
Called in refill to [facility] per Dr. Sondra Come for diazepam (Valium) 5 mg tablets - take 5 mg by mouth every 8 (eight) hours as needed for anxiety.  Disp. 30 tablets. 0 refills.  Called [place] to let her know.

## 2015-02-17 ENCOUNTER — Ambulatory Visit (HOSPITAL_COMMUNITY)
Admission: RE | Admit: 2015-02-17 | Discharge: 2015-02-17 | Disposition: A | Discharge status: Home / Self Care | Payer: Medicaid Other | Source: Ambulatory Visit | Attending: Family Medicine | Admitting: Family Medicine

## 2015-02-17 ENCOUNTER — Ambulatory Visit (HOSPITAL_BASED_OUTPATIENT_CLINIC_OR_DEPARTMENT_OTHER): Payer: Medicaid Other | Admitting: Oncology

## 2015-02-17 ENCOUNTER — Telehealth: Payer: Self-pay | Admitting: Oncology

## 2015-02-17 ENCOUNTER — Other Ambulatory Visit: Payer: Medicaid Other

## 2015-02-17 ENCOUNTER — Encounter: Payer: Self-pay | Admitting: Oncology

## 2015-02-17 VITALS — BP 123/62 | HR 84 | Temp 98.0°F | Resp 18 | Ht 66.0 in | Wt 319.4 lb

## 2015-02-17 DIAGNOSIS — E876 Hypokalemia: Secondary | ICD-10-CM

## 2015-02-17 DIAGNOSIS — K8020 Calculus of gallbladder without cholecystitis without obstruction: Secondary | ICD-10-CM

## 2015-02-17 DIAGNOSIS — M1712 Unilateral primary osteoarthritis, left knee: Secondary | ICD-10-CM | POA: Insufficient documentation

## 2015-02-17 DIAGNOSIS — Z6841 Body Mass Index (BMI) 40.0 and over, adult: Secondary | ICD-10-CM

## 2015-02-17 DIAGNOSIS — C541 Malignant neoplasm of endometrium: Secondary | ICD-10-CM | POA: Diagnosis not present

## 2015-02-17 DIAGNOSIS — M25562 Pain in left knee: Secondary | ICD-10-CM

## 2015-02-17 NOTE — Telephone Encounter (Signed)
Called Deborah [name] and advised her to keep her appointment with Dr. Sondra Come on 03/10/15.  Deborah [name] verbalized agreement.

## 2015-02-17 NOTE — Telephone Encounter (Signed)
Called and left a message that todays lab was cancelled per dr Lonna Cobb

## 2015-02-17 NOTE — Progress Notes (Signed)
OFFICE PROGRESS NOTE   February 17, 2015   Physicians:W.Brewster, J.Kinard, M.Georgeanna Lea (PCP [facility]) J.Hochrein  INTERVAL HISTORY:   Patient is seen, alone for visit, on observation since completing adjuvant carboplatin taxol and radiation 07-24-2013 for IIIC grade 2 endometrioid endometrial carcinoma.  Last CT AP was 07-2013. She is to see Dr Skeet Latch 02-24-15 and Dr Sondra Come 03-10-15, with some of these appointment dates apparently having been moved.  Patient had recent episode of apparent symptomatic cholelithiasis, seen in ED with [place] 02-05-15 showing multiple gallstones and no discreet liver lesions. She is to see Dr Judeth Horn in consultation upcoming.   She is otherwise doing well from standpoint of gyn cancer and that treatment. Bowels are moving regularly, no other abdominal or pelvic pain, good appetite.   No PAC Declined flu vaccine  ONCOLOGIC HISTORY Patient presented with spotting and menorrhagia, last PAP 2 years prior. Biopsies by Dr Clovia Cuff 09-22-2012 (path [id number]) showed endometroid adenocarcinoma. She had robotic hysterectomy with BSO by Dr Skeet Latch on 10-07-12 (no lymph nodes sampled as serosal cysts appeared malignant). Pathology [id number]) showed grade 2 endometroid adenocarcinoma with involvement of < 1/2 of myometrium, no LVSI, involvement of lower uterine segment, ovaries and tubes benign and the serosal cysts benign. CT AP 11-25-12 showed shotty bilateral external iliac nodes up to 1.3 cm and nonspecific < 5 mm left para-aortic nodes. She saw Dr Skeet Latch following the CT, with recommendation for chemotherapy and radiation in sandwich fashion. Taxol/carboplatin was begun 01-07-13, with 3 cycles thru 02-18-13 with gCSF support, then IMRT 45 cGy to pelvis by Dr Sondra Come from 5-13 thru 05-14-13. She missed appointments, then resumed chemotherapy with cycle 4 06-11-13 and completed cycle 6 on 07-24-13, with neulasta.  Review of systems as above, also: Mammograms done  02-08-15 stable benign findings and year follow up recommended. Left knee pain increased recently. Still trying to address weight. No SOB or other respiratory symptoms. No bleeding. No recent fever or symptoms of infection. Remainder of 10 point Review of Systems negative.  Objective:  Vital signs in last 24 hours:  BP 123/62 mmHg  Pulse 84  Temp(Src) 98 F (36.7 C) (Oral)  Resp 18  Ht 5\' 6"  (1.676 m)  Wt 319 lb 6.4 oz (144.879 kg)  BMI 51.58 kg/m2  LMP 09/20/2012 Weight up 2 lbs Alert, oriented and appropriate. Ambulatory with some difficulty, favoring left knee, otherwise appears comfortable and is very pleasant as always.   HEENT:PERRL, sclerae not icteric. Oral mucosa moist without lesions, posterior pharynx clear.  Neck supple. No JVD.  Lymphatics:no cervical,supraclavicular, axillary or inguinal adenopathy Resp: clear to auscultation bilaterally and normal percussion bilaterally Cardio: regular rate and rhythm. No gallop. GI: abdomen obese, soft, nontender including RUQ, no appreciable mass or organomegaly. Normally active bowel sounds. Surgical incision not remarkable. Musculoskeletal/ Extremities: without pitting edema, cords. Congenital deformity right hand and arm Neuro: speech fluent, no focal deficits. PSYCH appropriate mood and affect Skin without rash, ecchymosis, petechiae   Lab Results:  Results for orders placed or performed in visit on 11/94/17  Basic Metabolic Panel  Result Value Ref Range   Sodium 137 135 - 145 mEq/L   Potassium 4.4 3.5 - 5.3 mEq/L   Chloride 98 96 - 112 mEq/L   CO2 28 19 - 32 mEq/L   Glucose, Bld 102 (H) 70 - 99 mg/dL   BUN 16 6 - 23 mg/dL   Creat 0.78 0.50 - 1.10 mg/dL   Calcium 9.1 8.4 - 10.5 mg/dL  Magnesium  Result Value Ref Range   Magnesium 1.4 (L) 1.5 - 2.5 mg/dL   CBC from ED visit 02-05-15 WBC 8.0, Hgb 13.5, plt 287  Studies/Results: DIGITAL DIAGNOSTIC BILATERAL MAMMOGRAM WITH 3D TOMOSYNTHESIS AND CAD   [facility]  02-08-15  COMPARISON: Previous mammograms and ultrasounds from 08/27/2014, 08/20/2013, 01/23/2013.  ACR Breast Density Category b: There are scattered areas of fibroglandular density.  FINDINGS: No suspicious masses or calcifications are seen in either breast. The previously seen probably benign mass in the right breast at 6 o'clock has near resolved in the interval. The oval circumscribed gently lobulated mass within the lower slightly inner left breast measuring 1.3 cm is unchanged dating back to 01/23/2013. There is no mammographic evidence of malignancy in either breast.  Mammographic images were processed with CAD.  IMPRESSION: 1. Probably benign right breast mass has near resolved in the interval, and therefore is considered benign.  2. Unchanged benign mass in the lower inner left breast which has demonstrated greater than 2 years of stability and is therefore considered benign.  Three. No mammographic evidence of malignancy in either breast.  RECOMMENDATION: Screening mammogram in one year.  Medications: I have reviewed the patient's current medications. She continues K and Mg, with K holding in good range on this supplement. She is on lasix daily prn. DISCUSSION  Assessment/Plan:  1.IIIC endometrial carcinoma: history as above, clinically no concerns. She will keep appointment with Dr Skeet Latch upcoming. 2. 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 5. Hypomagnesemia and hypokalemia: not related to previous chemo, on supplements and followed by PCP  6. Mammograms stable as above 7.refused flu vaccine 8.cholelithiasis, apparently symptomatic earlier this month. Consultation with general surgery upcoming.  With counts normal and follow up  continuing with gyn onc and radiation onc, I will see her back at this office on as needed basis. Patient understands and agrees. Time spent 15 min including >50% counseling and coordination of care.   LIVESAY,LENNIS P, MD   02/17/2015, 2:15 PM

## 2015-02-19 DIAGNOSIS — K8020 Calculus of gallbladder without cholecystitis without obstruction: Secondary | ICD-10-CM | POA: Insufficient documentation

## 2015-02-19 DIAGNOSIS — Z9049 Acquired absence of other specified parts of digestive tract: Secondary | ICD-10-CM | POA: Insufficient documentation

## 2015-02-19 DIAGNOSIS — Z6841 Body Mass Index (BMI) 40.0 and over, adult: Secondary | ICD-10-CM

## 2015-02-19 NOTE — Assessment & Plan Note (Signed)
Will refer to surgery for eval given history of 4 attacks since 2013. Given return precautions.

## 2015-02-19 NOTE — Assessment & Plan Note (Signed)
History suspicious for sleep apnea. Order placed for sleep study.

## 2015-02-19 NOTE — Assessment & Plan Note (Addendum)
Likely related to known OA. Doubt septic joint with no fever, warmth, or erythema. Doubt fracture with no injury. Doubt ligamentous issue with stable joint. Will obtain XR to evaluate joint for progression of OA. Ibuprofen for discomfort. Advised to take with food and d/c if stomach becomes upset.

## 2015-02-19 NOTE — Progress Notes (Signed)
Patient ID: Deborah [name], female   DOB: [birth year], 49 y.o.   MRN: [id number]  Tommi Rumps, MD Phone: [phone]  Deborah [name] is a 49 y.o. female who presents today for f/u.  Gall stones: patient seen in ED for biliary colic several weeks ago. Pain resolved while there. US revealed gall stones. She had known history of this since 2013. No recurrent pain since that time. No recurrent N/V. Has had 4 attacks in the past. No evidence of cholecystitis on [place]. Took percocet for pain, though none recently.   Left knee pain: for the past month. Hurts with walking. Sore on the side of her knee. No injury, swelling, or fevers. Has a history of left knee pain that would previously go away. Notes some clicking. No popping, locking, or catching. No NSAIDs taken yet. Tried tylenol with mild benefit.   Patient additionally notes that she would like to reschedule her sleep study. She reports some concern previously that she had OSA and she missed her prior sleep study. She notes noring and that her son has reported that she stops breathing sometimes while asleep. She does not fall asleep during the day. She is not well rested on waking up. Denies issues with breathing.   Headaches: notes in bilateral temples. Has had these headaches previously. No worse than before. Notes mostly if does not sleep well. No weakness or numbness. Some photophobia and phonophobia. Tylenol helps with these.  PMH: nonsmoker.   ROS: Per HPI   Physical Exam Filed Vitals:   02/15/15 1418  BP: 126/76  Pulse: 66  Temp: 98.3 F (36.8 C)    Gen: Well NAD HEENT: PERRL,  MMM Lungs: CTABL Nl WOB Heart: RRR  Neuro: vision grossly intact, CN 3-12 intact, 5/5 strength in bilateral biceps, triceps, grip, quads, hamstrings, plantar and dorsiflexion, sensation to light touch intact in bilateral UE and LE, normal gait, moves easily to exam table MSK: knee exam difficult due to body habitus, there is a significant amount of  adipose tissue over lying bilateral knees, that being said there does not appear to be swelling bilaterally, there is no warmth or erythema, negative mcmurrays, no ligamentous laxity Exts: Non edematous BL  LE, warm and well perfused.    Assessment/Plan: Please see individual problem list.  Tommi Rumps, MD [place] PGY-3

## 2015-02-19 NOTE — Assessment & Plan Note (Signed)
With continued headaches with migrainous and tension features. Relieved by tylenol. No neuro abnormalities to indicate need for imaging. Could also be related to what sounds like OSA. Will need to obtain sleep study and potentially HAs could improve if OSA is diagnosed and treated with CPAP. Given return precautions.

## 2015-02-19 NOTE — Assessment & Plan Note (Addendum)
Check magnesium and BMET today.

## 2015-02-21 ENCOUNTER — Encounter: Payer: Self-pay | Admitting: Family Medicine

## 2015-02-21 NOTE — Progress Notes (Signed)
I was the preceptor for this visit. 

## 2015-02-22 ENCOUNTER — Telehealth: Payer: Self-pay | Admitting: Family Medicine

## 2015-02-22 DIAGNOSIS — Z13220 Encounter for screening for lipoid disorders: Secondary | ICD-10-CM

## 2015-02-22 DIAGNOSIS — M1712 Unilateral primary osteoarthritis, left knee: Secondary | ICD-10-CM

## 2015-02-22 NOTE — Telephone Encounter (Signed)
Called to inform patient of OA in left knee. Discussed treatment options of PT/exercise and weight loss and/or ortho referral. Patient requested to attempt exercises and weight loss as well as ortho appointment. This referral was placed.

## 2015-02-24 ENCOUNTER — Encounter: Payer: Self-pay | Admitting: Gynecologic Oncology

## 2015-02-24 ENCOUNTER — Ambulatory Visit: Payer: Medicaid Other | Attending: Gynecologic Oncology | Admitting: Gynecologic Oncology

## 2015-02-24 VITALS — BP 119/82 | HR 71 | Temp 98.3°F | Resp 18 | Ht 66.0 in | Wt 323.3 lb

## 2015-02-24 DIAGNOSIS — C541 Malignant neoplasm of endometrium: Secondary | ICD-10-CM | POA: Insufficient documentation

## 2015-02-24 DIAGNOSIS — Z8542 Personal history of malignant neoplasm of other parts of uterus: Secondary | ICD-10-CM | POA: Diagnosis not present

## 2015-02-24 DIAGNOSIS — Z923 Personal history of irradiation: Secondary | ICD-10-CM | POA: Diagnosis not present

## 2015-02-24 DIAGNOSIS — Z9221 Personal history of antineoplastic chemotherapy: Secondary | ICD-10-CM

## 2015-02-24 NOTE — Progress Notes (Signed)
Office Visit:  GYN ONCOLOGY   CC: Endometrial cancer surveillance  HPI:49 y/o G2 P1 LNMP  06/2012.  Deborah [name] was referred to Dr. Hulan Fray who collected an endometrial biopsy and a endocervical biopsy. Both were positive for endometrial adenocarcinoma.  On 10/07/2012 she underwent robotic hysterectomy bilateral salpingo-oophorectomy. At the time of surgery multiple cystic lesions were noted throughout the entire pelvis that was suspicious for metastatic disease, as such lymph node dissection was not collected but these lesions were biopsied. Final pathology was notable for  1. Soft tissue, biopsy, right para-colic gutter - BENIGN PARA-COLIC GUTTER SOFT TISSUE WITH BENIGN CYST (4.0 CM). SEE COMMENT. - NEGATIVE FOR MALIGNANCY. 2. Uterus +/- tubes/ovaries, neoplastic - ENDOMETRIAL ADENOCARCINOMA, ENDOMETRIOID-TYPE, SEE COMMENT. - TUMOR INVADES LESS THAN 1/2 OF MYOMETRIAL THICKNESS.- NO LYMPHOVASCULAR INVASION IDENTIFIED. - TUMOR INVADES INTO LOWER UTERINE SEGMENT. - UTERINE ADENOMYOSIS. - BENIGN RIGHT AND LEFT OVARIES; NO ATYPIA OR MALIGNANCY PRESENT. - BENIGN RIGHT AND LEFT FALLOPIAN TUBES WITH BENIGN SEROUS-TYPE PARATUBAL CYSTS. - BENIGN CERVIX; NEGATIVE FOR INTRAEPITHELIAL LESION OR MALIGNANCY. - BENIGN MULTILOCULAR UTERINE SEROSAL CYST.  CT abdomen and pelvis 11/25/2012  IMPRESSION:  1. Shotty bilateral external iliac lymphadenopathy measuring up to 1.3 cm, suspicious for metastatic disease. Nonspecific less than 5 mm retroperitoneal lymph nodes also noted in the left para-aortic region. 2. 2.7 cm postop lymphocele versus low attenuation lymphadenopathy in the proximal left external iliac chain. 3. Cholelithiasis and right nephrolithiasis incidentally noted.   End of treatment CT 08/25/2013 notable for no evidence of abdominal lymphadenopathy. There Is no evidence of inflammatory process abscess or ascites the adnexal regions are unremarkable in appearance there is no new or progressive disease  within the abdomen or pelvis  Reports weight gain since end of treatment.  Is doing well.  Alopecia has resolved.  Pap 09/23/2014 wnl   Cholelithiasis flare 01/2015.  Plans for cholecystectomy soon.  Mammogram 01/2015 wnl   Past Surgical Hx:  Past Surgical History  Procedure Laterality Date  . Hiatal hernia repair    . Repair vaginal cuff  10/07/2012    Procedure: REPAIR VAGINAL CUFF;  Surgeon: [name], MD PHD;  Location: [facility];  Service: Gynecology;;  . Robotic assisted total hysterectomy with bilateral salpingo oopherectomy  10/07/2012    Procedure: ROBOTIC ASSISTED TOTAL HYSTERECTOMY WITH BILATERAL SALPINGO OOPHORECTOMY;  Surgeon: [name], MD PHD;  Location: [facility];  Service: Gynecology;  Laterality: Bilateral;  . Abdominal hysterectomy  10/2011    complete    Past Medical Hx:  Past Medical History  Diagnosis Date  . Congenital birth defect     Right hand  . Anemia   . Arthritis     knees  . Endometrial cancer   . Anxiety   . Swelling     ANKLES - TAKES LASIX  . Depression   . HLD (hyperlipidemia)     borderline  . Obesity   . Hx of radiation therapy 04/07/46- 05/14/46    pelvis 45 gray 25 fx  . Hx of echocardiogram     Echo (9/15): EF 50-55%, normal wall motion, grade 2 diastolic dysfunction, mild LAE    Past Gynecological History:  G2 P1 Menarche 15, Patient's last menstrual period was 09/20/2012. No h/o abn pap test  Family Hx:  Family History  Problem Relation Age of Onset  . Pancreatic cancer Maternal Grandfather     or liver cancer  . Hypertension Maternal Grandfather   . Cancer Maternal Grandfather     pancreatic  . Hypertension  Maternal Grandmother   . Diabetes Mother   . Colon polyps Maternal Aunt   . Cancer Maternal Aunt     lung  . Colon cancer Cousin   . Cancer Cousin     lung  . Lung cancer Maternal Aunt   . Heart attack Neg Hx   . Stroke Cousin    Mat aunt lung cancer Dx 44 (tob use) Mat GF pancreatic cancer Mat cousin lung  cancer dx 41's (tob use)  Review of Systems:  Constitutional  Feels well, weight gain, no malaise. Cardiovascular  No chest pain, shortness of breath, or edema  Pulmonary  No cough or wheeze.  Gastro Intestinal  No nausea, vomitting, or diarrhoea. No rectal bleeding. Genito Urinary  No frequency, urgency, dysuria,no vaginal spotting or discharge.  Is compliant with the use of her vaginal dilator Musculo Skeletal  No myalgia, left knee arthralgia,  joint swelling and pain   Physical Exam: BP 119/82 mmHg  Pulse 71  Temp(Src) 98.3 F (36.8 C) (Oral)  Resp 18  Ht 5\' 6"  (1.676 m)  Wt 323 lb 4.8 oz (146.648 kg)  BMI 52.21 kg/m2  LMP 09/20/2012 Wt Readings from Last 3 Encounters:  02/24/15 323 lb 4.8 oz (146.648 kg)  02/17/15 319 lb 6.4 oz (144.879 kg)  02/15/15 317 lb 8 oz (144.017 kg)   WD in NAD CHEST:  CTA CARDIAC:  RRR ABDOMEN :  Soft nontender. Trocar sites without evidence of mass or hernia. Normoactive bowel sounds, abdomen soft, non-tender and morbidly obese.  BACK:  No CVAT LN:  No cervical supra clavicular or inguinal adenopathy PELVIC:  Nl EGBUS, Vaginal cuff intact.  No discharge, bleeding or tenderness or cul de sac nodularity Rectal:   Good tone no masses EXT:  Missing digits on the left hand.  No lower extremity edema or cyanosis.  Assessment/Plan:  Deborah [name]  is a 49 y.o.  year old with grade 2 endometrial adenocarcinoma noted on  the endometrial biopsy and endocervical curettage specimens. She underwent robotic total laparoscopic hysterectomy bilateral salpingo-oophorectomy. At the time of surgery diffuse miliary cystic-like lesions were noted in the cul-de-sac on on the rectosigmoid colon highly suspicious for metastatic disease. Because of these findings lymph node dissection was not performed. Final pathology was notable for grade 2 endometrioid endometrial adenocarcinoma with 12.5% myometrial invasion no lymphovascular space invasion no cervical  involvement without confirmation of metastases in the eplic systs.    Received 6 cycles of Taxol/Carboplatin and pelvic XRT sandwich fashion.  Completed 07/24/2013.  F/U with Dr. Jaynie Crumble  05/2015 F/u with Gyn Onc 08/2015 Counseled on the signs and symptoms of recurrence

## 2015-02-24 NOTE — Patient Instructions (Signed)
Plan to follow up with Dr. Sondra Come in June (and over the phone about your medication) and Dr. Skeet Latch in Oct 2016.  Please call closer to October to scheduled.  Please call for any questions or concerns.

## 2015-02-28 ENCOUNTER — Emergency Department (HOSPITAL_COMMUNITY)
Admission: EM | Admit: 2015-02-28 | Discharge: 2015-03-01 | Disposition: A | Discharge status: Home / Self Care | Payer: Medicaid Other | Attending: Emergency Medicine | Admitting: Emergency Medicine

## 2015-02-28 ENCOUNTER — Encounter (HOSPITAL_COMMUNITY): Payer: Self-pay

## 2015-02-28 DIAGNOSIS — K8020 Calculus of gallbladder without cholecystitis without obstruction: Secondary | ICD-10-CM | POA: Insufficient documentation

## 2015-02-28 DIAGNOSIS — Z79899 Other long term (current) drug therapy: Secondary | ICD-10-CM | POA: Insufficient documentation

## 2015-02-28 DIAGNOSIS — Z8774 Personal history of (corrected) congenital malformations of heart and circulatory system: Secondary | ICD-10-CM | POA: Insufficient documentation

## 2015-02-28 DIAGNOSIS — K2970 Gastritis, unspecified, without bleeding: Secondary | ICD-10-CM | POA: Insufficient documentation

## 2015-02-28 DIAGNOSIS — Z8589 Personal history of malignant neoplasm of other organs and systems: Secondary | ICD-10-CM | POA: Diagnosis not present

## 2015-02-28 DIAGNOSIS — M1990 Unspecified osteoarthritis, unspecified site: Secondary | ICD-10-CM | POA: Diagnosis not present

## 2015-02-28 DIAGNOSIS — Z7982 Long term (current) use of aspirin: Secondary | ICD-10-CM | POA: Diagnosis not present

## 2015-02-28 DIAGNOSIS — R109 Unspecified abdominal pain: Secondary | ICD-10-CM

## 2015-02-28 DIAGNOSIS — E876 Hypokalemia: Secondary | ICD-10-CM | POA: Diagnosis not present

## 2015-02-28 DIAGNOSIS — F329 Major depressive disorder, single episode, unspecified: Secondary | ICD-10-CM | POA: Insufficient documentation

## 2015-02-28 DIAGNOSIS — E669 Obesity, unspecified: Secondary | ICD-10-CM | POA: Diagnosis not present

## 2015-02-28 DIAGNOSIS — F419 Anxiety disorder, unspecified: Secondary | ICD-10-CM | POA: Insufficient documentation

## 2015-02-28 DIAGNOSIS — R1010 Upper abdominal pain, unspecified: Secondary | ICD-10-CM | POA: Diagnosis present

## 2015-02-28 LAB — AMYLASE: AMYLASE: 50 U/L (ref 0–105)

## 2015-02-28 LAB — CBC WITH DIFFERENTIAL/PLATELET
BASOS ABS: 0 10*3/uL (ref 0.0–0.1)
Basophils Relative: 0 % (ref 0–1)
Eosinophils Absolute: 0.2 10*3/uL (ref 0.0–0.7)
Eosinophils Relative: 3 % (ref 0–5)
HCT: 41.9 % (ref 36.0–46.0)
Hemoglobin: 13.6 g/dL (ref 12.0–15.0)
LYMPHS ABS: 1.5 10*3/uL (ref 0.7–4.0)
LYMPHS PCT: 26 % (ref 12–46)
MCH: 30.2 pg (ref 26.0–34.0)
MCHC: 32.5 g/dL (ref 30.0–36.0)
MCV: 92.9 fL (ref 78.0–100.0)
Monocytes Absolute: 0.5 10*3/uL (ref 0.1–1.0)
Monocytes Relative: 8 % (ref 3–12)
NEUTROS ABS: 3.7 10*3/uL (ref 1.7–7.7)
NEUTROS PCT: 63 % (ref 43–77)
PLATELETS: 260 10*3/uL (ref 150–400)
RBC: 4.51 MIL/uL (ref 3.87–5.11)
RDW: 13.8 % (ref 11.5–15.5)
WBC: 5.9 10*3/uL (ref 4.0–10.5)

## 2015-02-28 LAB — I-STAT CHEM 8, ED
BUN: 18 mg/dL (ref 6–23)
CALCIUM ION: 1.17 mmol/L (ref 1.12–1.23)
CREATININE: 0.8 mg/dL (ref 0.50–1.10)
Chloride: 99 mmol/L (ref 96–112)
Glucose, Bld: 121 mg/dL — ABNORMAL HIGH (ref 70–99)
HEMATOCRIT: 45 % (ref 36.0–46.0)
Hemoglobin: 15.3 g/dL — ABNORMAL HIGH (ref 12.0–15.0)
Potassium: 2.9 mmol/L — ABNORMAL LOW (ref 3.5–5.1)
SODIUM: 142 mmol/L (ref 135–145)
TCO2: 25 mmol/L (ref 0–100)

## 2015-02-28 LAB — LIPASE, BLOOD: Lipase: 19 U/L (ref 11–59)

## 2015-02-28 MED ORDER — MORPHINE SULFATE 10 MG/ML IJ SOLN
10.0000 mg | Freq: Once | INTRAMUSCULAR | Status: DC
  Filled 2015-02-28: qty 1

## 2015-02-28 MED ORDER — GI COCKTAIL ~~LOC~~
30.0000 mL | Freq: Once | ORAL | Status: AC
  Administered 2015-03-01: 30 mL via ORAL
  Filled 2015-02-28: qty 30

## 2015-02-28 MED ORDER — ONDANSETRON 4 MG PO TBDP
4.0000 mg | ORAL_TABLET | Freq: Once | ORAL | Status: AC
  Administered 2015-03-01: 4 mg via ORAL
  Filled 2015-02-28: qty 1

## 2015-02-28 NOTE — ED Notes (Signed)
Pt aware a urine sample is needed. Pt unable to urinate at this time.

## 2015-02-28 NOTE — ED Notes (Signed)
Pt dx with gallstones but she wont tell me when, she told ne i could read the chart because she was in pain, she states that tonight she was laying down and the pain started again, under her left breast and around the right side, she states its the same pain as her previous gallstone pain

## 2015-03-01 LAB — HEPATIC FUNCTION PANEL
ALBUMIN: 3.5 g/dL (ref 3.5–5.2)
ALK PHOS: 87 U/L (ref 39–117)
ALT: 26 U/L (ref 0–35)
AST: 38 U/L — ABNORMAL HIGH (ref 0–37)
BILIRUBIN TOTAL: 0.5 mg/dL (ref 0.3–1.2)
Bilirubin, Direct: 0.2 mg/dL (ref 0.0–0.5)
Indirect Bilirubin: 0.3 mg/dL (ref 0.3–0.9)
TOTAL PROTEIN: 7.9 g/dL (ref 6.0–8.3)

## 2015-03-01 MED ORDER — MORPHINE SULFATE 10 MG/ML IJ SOLN
10.0000 mg | Freq: Once | INTRAMUSCULAR | Status: AC | PRN
  Administered 2015-03-01: 10 mg via INTRAMUSCULAR
  Filled 2015-03-01: qty 1

## 2015-03-01 MED ORDER — HYDROCODONE-ACETAMINOPHEN 5-325 MG PO TABS: 2.0000 | ORAL_TABLET | ORAL | Status: DC | PRN

## 2015-03-01 MED ORDER — ONDANSETRON 4 MG PO TBDP: 4.0000 mg | ORAL_TABLET | Freq: Three times a day (TID) | ORAL | Status: DC | PRN

## 2015-03-01 MED ORDER — OMEPRAZOLE 20 MG PO CPDR: 20.0000 mg | DELAYED_RELEASE_CAPSULE | Freq: Two times a day (BID) | ORAL | Status: DC

## 2015-03-01 MED ORDER — OXYCODONE-ACETAMINOPHEN 5-325 MG PO TABS
2.0000 | ORAL_TABLET | Freq: Once | ORAL | Status: AC
  Administered 2015-03-01: 2 via ORAL
  Filled 2015-03-01: qty 2

## 2015-03-01 NOTE — Discharge Instructions (Signed)
Keep her appointment with general surgeon. Prilosec as prescribed, this is an acid blocking medication that will help if your pain is from stomach acid or ulcer. Potassium 3 times per day for the next 7 days.  Abdominal Pain Many things can cause abdominal pain. Usually, abdominal pain is not caused by a disease and will improve without treatment. It can often be observed and treated at home. Your health care provider will do a physical exam and possibly order blood tests and X-rays to help determine the seriousness of your pain. However, in many cases, more time must pass before a clear cause of the pain can be found. Before that point, your health care provider may not know if you need more testing or further treatment. HOME CARE INSTRUCTIONS  Monitor your abdominal pain for any changes. The following actions may help to alleviate any discomfort you are experiencing:  Only take over-the-counter or prescription medicines as directed by your health care provider.  Do not take laxatives unless directed to do so by your health care provider.  Try a clear liquid diet (broth, tea, or water) as directed by your health care provider. Slowly move to a bland diet as tolerated. SEEK MEDICAL CARE IF:  You have unexplained abdominal pain.  You have abdominal pain associated with nausea or diarrhea.  You have pain when you urinate or have a bowel movement.  You experience abdominal pain that wakes you in the night.  You have abdominal pain that is worsened or improved by eating food.  You have abdominal pain that is worsened with eating fatty foods.  You have a fever. SEEK IMMEDIATE MEDICAL CARE IF:   Your pain does not go away within 2 hours.  You keep throwing up (vomiting).  Your pain is felt only in portions of the abdomen, such as the right side or the left lower portion of the abdomen.  You pass bloody or black tarry stools. MAKE SURE YOU:  Understand these instructions.   Will  watch your condition.   Will get help right away if you are not doing well or get worse.  Document Released: 08/22/2005 Document Revised: 11/17/2013 Document Reviewed: 07/22/2013 [facility] Patient Information 2015 [place], [place]. This information is not intended to replace advice given to you by your health care provider. Make sure you discuss any questions you have with your health care provider.  Cholelithiasis Cholelithiasis (also called gallstones) is a form of gallbladder disease in which gallstones form in your gallbladder. The gallbladder is an organ that stores bile made in the liver, which helps digest fats. Gallstones begin as small crystals and slowly grow into stones. Gallstone pain occurs when the gallbladder spasms and a gallstone is blocking the duct. Pain can also occur when a stone passes out of the duct.  RISK FACTORS  Being female.   Having multiple pregnancies. Health care providers sometimes advise removing diseased gallbladders before future pregnancies.   Being obese.  Eating a diet heavy in fried foods and fat.   Being older than 48 years and increasing age.   Prolonged use of medicines containing female hormones.   Having diabetes mellitus.   Rapidly losing weight.   Having a family history of gallstones (heredity).  SYMPTOMS  Nausea.   Vomiting.  Abdominal pain.   Yellowing of the skin (jaundice).   Sudden pain. It may persist from several minutes to several hours.  Fever.   Tenderness to the touch. In some cases, when gallstones do not move into  the bile duct, people have no pain or symptoms. These are called "silent" gallstones.  TREATMENT Silent gallstones do not need treatment. In severe cases, emergency surgery may be required. Options for treatment include:  Surgery to remove the gallbladder. This is the most common treatment.  Medicines. These do not always work and may take 6-12 months or more to work.  Shock wave  treatment (extracorporeal biliary lithotripsy). In this treatment an ultrasound machine sends shock waves to the gallbladder to break gallstones into smaller pieces that can pass into the intestines or be dissolved by medicine. HOME CARE INSTRUCTIONS   Only take over-the-counter or prescription medicines for pain, discomfort, or fever as directed by your health care provider.   Follow a low-fat diet until seen again by your health care provider. Fat causes the gallbladder to contract, which can result in pain.   Follow up with your health care provider as directed. Attacks are almost always recurrent and surgery is usually required for permanent treatment.  SEEK IMMEDIATE MEDICAL CARE IF:   Your pain increases and is not controlled by medicines.   You have a fever or persistent symptoms for more than 2-3 days.   You have a fever and your symptoms suddenly get worse.   You have persistent nausea and vomiting.  MAKE SURE YOU:   Understand these instructions.  Will watch your condition.  Will get help right away if you are not doing well or get worse. Document Released: 11/08/2005 Document Revised: 07/15/2013 Document Reviewed: 05/06/2013 [facility] Patient Information 2015 [place], [place]. This information is not intended to replace advice given to you by your health care provider. Make sure you discuss any questions you have with your health care provider.  Gastritis, Adult Gastritis is soreness and puffiness (inflammation) of the lining of the stomach. If you do not get help, gastritis can cause bleeding and sores (ulcers) in the stomach. HOME CARE   Only take medicine as told by your doctor.  If you were given antibiotic medicines, take them as told. Finish the medicines even if you start to feel better.  Drink enough fluids to keep your pee (urine) clear or pale yellow.  Avoid foods and drinks that make your problems worse. Foods you may want to avoid include:  Caffeine  or alcohol.  Chocolate.  Mint.  Garlic and onions.  Spicy foods.  Citrus fruits, including oranges, lemons, or limes.  Food containing tomatoes, including sauce, chili, salsa, and pizza.  Fried and fatty foods.  Eat small meals throughout the day instead of large meals. GET HELP RIGHT AWAY IF:   You have black or dark red poop (stools).  You throw up (vomit) blood. It may look like coffee grounds.  You cannot keep fluids down.  Your belly (abdominal) pain gets worse.  You have a fever.  You do not feel better after 1 week.  You have any other questions or concerns. MAKE SURE YOU:   Understand these instructions.  Will watch your condition.  Will get help right away if you are not doing well or get worse. Document Released: 04/30/2008 Document Revised: 02/04/2012 Document Reviewed: 12/26/2011 [facility] Patient Information 2015 [place], [place]. This information is not intended to replace advice given to you by your health care provider. Make sure you discuss any questions you have with your health care provider.  Hypokalemia Hypokalemia means that the amount of potassium in the blood is lower than normal.Potassium is a chemical, called an electrolyte, that helps regulate the amount  of fluid in the body. It also stimulates muscle contraction and helps nerves function properly.Most of the body's potassium is inside of cells, and only a very small amount is in the blood. Because the amount in the blood is so small, minor changes can be life-threatening. CAUSES  Antibiotics.  Diarrhea or vomiting.  Using laxatives too much, which can cause diarrhea.  Chronic kidney disease.  Water pills (diuretics).  Eating disorders (bulimia).  Low magnesium level.  Sweating a lot. SIGNS AND SYMPTOMS  Weakness.  Constipation.  Fatigue.  Muscle cramps.  Mental confusion.  Skipped heartbeats or irregular heartbeat (palpitations).  Tingling or numbness. DIAGNOSIS    Your health care provider can diagnose hypokalemia with blood tests. In addition to checking your potassium level, your health care provider may also check other lab tests. TREATMENT Hypokalemia can be treated with potassium supplements taken by mouth or adjustments in your current medicines. If your potassium level is very low, you may need to get potassium through a vein (IV) and be monitored in the hospital. A diet high in potassium is also helpful. Foods high in potassium are:  Nuts, such as peanuts and pistachios.  Seeds, such as sunflower seeds and pumpkin seeds.  Peas, lentils, and lima beans.  Whole grain and bran cereals and breads.  Fresh fruit and vegetables, such as apricots, avocado, bananas, cantaloupe, kiwi, oranges, tomatoes, asparagus, and potatoes.  Orange and tomato juices.  Red meats.  Fruit yogurt. HOME CARE INSTRUCTIONS  Take all medicines as prescribed by your health care provider.  Maintain a healthy diet by including nutritious food, such as fruits, vegetables, nuts, whole grains, and lean meats.  If you are taking a laxative, be sure to follow the directions on the label. SEEK MEDICAL CARE IF:  Your weakness gets worse.  You feel your heart pounding or racing.  You are vomiting or having diarrhea.  You are diabetic and having trouble keeping your blood glucose in the normal range. SEEK IMMEDIATE MEDICAL CARE IF:  You have chest pain, shortness of breath, or dizziness.  You are vomiting or having diarrhea for more than 2 days.  You faint. MAKE SURE YOU:   Understand these instructions.  Will watch your condition.  Will get help right away if you are not doing well or get worse. Document Released: 11/12/2005 Document Revised: 09/02/2013 Document Reviewed: 05/15/2013 [facility] Patient Information 2015 [place], [place]. This information is not intended to replace advice given to you by your health care provider. Make sure you discuss any  questions you have with your health care provider.

## 2015-03-01 NOTE — ED Provider Notes (Signed)
CSN: [id number]     Arrival date & time 02/28/15  2129 History   First MD Initiated Contact with Patient 02/28/15 2308     Chief Complaint  Patient presents with  . Abdominal Pain      HPI  History presents evaluation of upper abdominal pain. She states that when she history for cancer a few years ago on her CT scan she was told she had gallstones. On recent ultrasound was told she still had gall stones. Seen and evaluated emergency room on March 12. Had normal enzymes but an ultrasound showing stones. Referred to her primary care physician and has been referred to a surgeon. Had an episode of pain today that was colicky epigastric pain. States that she did not take her pain medicine at home. I do not fully understand why she did not. However she states she called her son because "I knew I would have to come to the hospital". No vomiting is not been jaundiced no dark urine light stools. No obstipation or diarrhea. Does not have frank food intolerance of fatty food related phenomenon.  Past Medical History  Diagnosis Date  . Congenital birth defect     Right hand  . Anemia   . Arthritis     knees  . Endometrial cancer   . Anxiety   . Swelling     ANKLES - TAKES LASIX  . Depression   . HLD (hyperlipidemia)     borderline  . Obesity   . Hx of radiation therapy 04/07/13- 05/14/13    pelvis 45 gray 25 fx  . Hx of echocardiogram     Echo (9/15): EF 50-55%, normal wall motion, grade 2 diastolic dysfunction, mild LAE   Past Surgical History  Procedure Laterality Date  . Hiatal hernia repair    . Repair vaginal cuff  10/07/2012    Procedure: REPAIR VAGINAL CUFF;  Surgeon: [name], MD PHD;  Location: [facility];  Service: Gynecology;;  . Robotic assisted total hysterectomy with bilateral salpingo oopherectomy  10/07/2012    Procedure: ROBOTIC ASSISTED TOTAL HYSTERECTOMY WITH BILATERAL SALPINGO OOPHORECTOMY;  Surgeon: [name], MD PHD;  Location: [facility];  Service: Gynecology;   Laterality: Bilateral;  . Abdominal hysterectomy  10/2011    complete   Family History  Problem Relation Age of Onset  . Pancreatic cancer Maternal Grandfather     or liver cancer  . Hypertension Maternal Grandfather   . Cancer Maternal Grandfather     pancreatic  . Hypertension Maternal Grandmother   . Diabetes Mother   . Colon polyps Maternal Aunt   . Cancer Maternal Aunt     lung  . Colon cancer Cousin   . Cancer Cousin     lung  . Lung cancer Maternal Aunt   . Heart attack Neg Hx   . Stroke Cousin    History  Substance Use Topics  . Smoking status: Never Smoker   . Smokeless tobacco: Never Used  . Alcohol Use: Yes     Comment: rare   OB History    Gravida Para Term Preterm AB TAB SAB Ectopic Multiple Living   2 1 0 1 1 0 1 0 0 1      Review of Systems  Constitutional: Negative for fever, chills, diaphoresis, appetite change and fatigue.  HENT: Negative for mouth sores, sore throat and trouble swallowing.   Eyes: Negative for visual disturbance.  Respiratory: Negative for cough, chest tightness, shortness of breath and wheezing.   Cardiovascular: Negative  for chest pain.  Gastrointestinal: Positive for abdominal pain. Negative for nausea, vomiting, diarrhea and abdominal distention.  Endocrine: Negative for polydipsia, polyphagia and polyuria.  Genitourinary: Negative for dysuria, frequency and hematuria.  Musculoskeletal: Negative for gait problem.  Skin: Negative for color change, pallor and rash.  Neurological: Negative for dizziness, syncope, light-headedness and headaches.  Hematological: Does not bruise/bleed easily.  Psychiatric/Behavioral: Negative for behavioral problems and confusion.      Allergies  Review of patient's allergies indicates no known allergies.  Home Medications   Prior to Admission medications   Medication Sig Start Date End Date Taking? Authorizing Provider  acetaminophen (TYLENOL) 500 MG tablet Take 1,000 mg by mouth every 6  (six) hours as needed for moderate pain (pain).   Yes Historical Provider, MD  aspirin 81 MG tablet Take 81 mg by mouth every morning.    Yes Historical Provider, MD  bifidobacterium infantis (ALIGN) capsule Take 1 capsule by mouth daily as needed (for regularity).  10/03/12  Yes Jerene Bears, MD  carvedilol (COREG) 3.125 MG tablet Take 1 tablet in the am and 2 in the pm. 08/19/14  Yes Minus Breeding, MD  diazepam (VALIUM) 5 MG tablet Take 1 tablet (5 mg total) by mouth every 8 (eight) hours as needed for anxiety (anxiety). Patient taking differently: Take 5 mg by mouth every 8 (eight) hours as needed for anxiety (anxiety). Called in refill for 30 tablets. 0 refills per Dr. Sondra Come to [place] [facility] on 02/16/15. 02/08/15  Yes Thea Silversmith, MD  docusate sodium (COLACE) 50 MG capsule Take 50 mg by mouth daily as needed for mild constipation.  11/09/13  Yes Leone Haven, MD  furosemide (LASIX) 20 MG tablet Take 1 tablet (20 mg total) by mouth daily. Patient taking differently: Take 20 mg by mouth daily as needed for fluid.  10/01/14  Yes Leeanne Rio, MD  ibuprofen (ADVIL,MOTRIN) 800 MG tablet Take 1 tablet (800 mg total) by mouth every 8 (eight) hours as needed. 09/29/14  Yes Evelina Bucy, MD  lactase (LACTAID) 3000 UNITS tablet Take 1 tablet by mouth 3 (three) times daily as needed (for dairy intake).  10/03/12  Yes Jerene Bears, MD  loperamide (IMODIUM) 2 MG capsule Take 2 mg by mouth 4 (four) times daily as needed for diarrhea or loose stools (diarrhea).    Yes Historical Provider, MD  magnesium chloride (SLOW-MAG) 64 MG TBEC SR tablet Take 2 tablets by mouth 2 (two) times daily. 12/02/13  Yes Leone Haven, MD  mometasone (NASONEX) 50 MCG/ACT nasal spray Place 2 sprays into the nose daily. One spray in each nostril. Patient taking differently: Place 2 sprays into the nose as needed (nasal congestion). One spray in each nostril. 11/09/13  Yes Leone Haven, MD  OVER THE COUNTER MEDICATION  Allergy Plus Sinus Headache- acetaminophen 325, diphenhydramine 12.5, phenylephrine 5 mg   Yes Historical Provider, MD  OVER THE COUNTER MEDICATION Take 5 mg by mouth as needed (ALLERGY / SINUS). Medi-phenyl 5 mg   Yes Historical Provider, MD  oxyCODONE-acetaminophen (PERCOCET) 5-325 MG per tablet Take 1 tablet by mouth every 6 (six) hours as needed for moderate pain. 02/05/15  Yes Evelina Bucy, MD  polyethylene glycol powder (GLYCOLAX/MIRALAX) powder Take 17 g by mouth daily as needed. Patient taking differently: Take 17 g by mouth daily as needed for mild constipation.  11/10/13  Yes Leone Haven, MD  potassium chloride (K-DUR) 10 MEQ tablet Take two tablets twice a day or  as directed. Patient taking differently: Take 20 mEq by mouth 2 (two) times daily. Take two tablets twice a day or as directed. 11/30/14  Yes Leone Haven, MD  sertraline (ZOLOFT) 100 MG tablet take 1 tablet by mouth once daily Patient taking differently: Take 100 mg by mouth daily. take 1 tablet by mouth once daily 06/02/14  Yes Leone Haven, MD  cyclobenzaprine (FLEXERIL) 10 MG tablet Take 1 tablet (10 mg total) by mouth 3 (three) times daily as needed for muscle spasms. Patient not taking: Reported on 02/17/2015 09/29/14   Evelina Bucy, MD  HYDROcodone-acetaminophen (NORCO/VICODIN) 5-325 MG per tablet Take 2 tablets by mouth every 4 (four) hours as needed. 03/01/15   Tanna Furry, MD  omeprazole (PRILOSEC) 20 MG capsule Take 1 capsule (20 mg total) by mouth 2 (two) times daily. 03/01/15   Tanna Furry, MD  ondansetron (ZOFRAN ODT) 4 MG disintegrating tablet Take 1 tablet (4 mg total) by mouth every 8 (eight) hours as needed for nausea. 03/01/15   Tanna Furry, MD   BP 149/62 mmHg  Pulse 97  Temp(Src) 98.6 F (37 C) (Oral)  Resp 20  Ht 5\' 6"  (1.676 m)  Wt 318 lb (144.244 kg)  BMI 51.35 kg/m2  SpO2 97%  LMP 09/20/2012 Physical Exam  Constitutional: She is oriented to person, place, and time. She appears well-developed and  well-nourished. No distress.  HENT:  Head: Normocephalic.  Eyes: Conjunctivae are normal. Pupils are equal, round, and reactive to light. No scleral icterus.  Neck: Normal range of motion. Neck supple. No thyromegaly present.  Cardiovascular: Normal rate and regular rhythm.  Exam reveals no gallop and no friction rub.   No murmur heard. Pulmonary/Chest: Effort normal and breath sounds normal. No respiratory distress. She has no wheezes. She has no rales.  Abdominal: Soft. Bowel sounds are normal. She exhibits no distension. There is no tenderness. There is no rebound.  Exam obese. Minimal epigastric tenderness without guarding rebound or peritoneal irritation.  Musculoskeletal: Normal range of motion.  Neurological: She is alert and oriented to person, place, and time.  Skin: Skin is warm and dry. No rash noted.  Psychiatric: She has a normal mood and affect. Her behavior is normal.    ED Course  Procedures (including critical care time) Labs Review Labs Reviewed  HEPATIC FUNCTION PANEL - Abnormal; Notable for the following:    AST 38 (*)    All other components within normal limits  I-STAT CHEM 8, ED - Abnormal; Notable for the following:    Potassium 2.9 (*)    Glucose, Bld 121 (*)    Hemoglobin 15.3 (*)    All other components within normal limits  AMYLASE  CBC WITH DIFFERENTIAL/PLATELET  LIPASE, BLOOD    Imaging Review No results found.   EKG Interpretation   Date/Time:  Monday February 28 2015 21:34:22 EDT Ventricular Rate:  97 PR Interval:  150 QRS Duration: 94 QT Interval:  364 QTC Calculation: 462 R Axis:   39 Text Interpretation:  Sinus rhythm Atrial premature complex Low voltage,  precordial leads Baseline wander in lead(s) II aVR aVF V3 V4 V5 Confirmed  by Jeneen Rinks  MD, [place] ([id number]) on 02/28/2015 11:48:11 PM      MDM   Final diagnoses:  Abdominal pain, unspecified abdominal location  Calculus of gallbladder without cholecystitis without obstruction   Gastritis    Patient's enzymes remain without significant elevation.  No leukocytosis. Case I'll 02.9. Declines IM medication. Offered GI cocktail, Prilosec, and  by mouth Percocet. I think she is still appropriate for continued outpatient follow-up with general surgery. She'll likely need hiatus scan to determine if this is actually biliary colic versus acid related phenomenon. Plan will be discharge home. Asked her to actually use the pain medication prescribed or she has any other episodes. We'll add a proton pump inhibitor. Primary care follow-up. ER with acute changes.    Tanna Furry, MD 03/01/15 [phone]

## 2015-03-07 ENCOUNTER — Telehealth: Payer: Self-pay | Admitting: General Practice

## 2015-03-07 NOTE — Telephone Encounter (Signed)
"  just needs someone to call me back" Deborah [name] says they will not see her because she still owes money Needs a referral to another dr

## 2015-03-07 NOTE — Telephone Encounter (Signed)
Spoke with patient and she would like to try going to [place].  Jazmin Hartsell,CMA

## 2015-03-07 NOTE — Telephone Encounter (Signed)
Will fax referral to [place].

## 2015-03-10 ENCOUNTER — Ambulatory Visit: Payer: Medicaid Other | Admitting: Radiation Oncology

## 2015-03-16 ENCOUNTER — Telehealth: Payer: Self-pay | Admitting: Family Medicine

## 2015-03-16 ENCOUNTER — Other Ambulatory Visit: Payer: Self-pay | Admitting: Family Medicine

## 2015-03-16 MED ORDER — FUROSEMIDE 20 MG PO TABS: 20.0000 mg | ORAL_TABLET | Freq: Every day | ORAL | Status: DC | PRN

## 2015-03-16 NOTE — Telephone Encounter (Signed)
Pt called and needs a refill on her Lasix called in. jw

## 2015-03-16 NOTE — Telephone Encounter (Signed)
Awaiting msg from blue team - have asked them to call pt and ask if she still takes this. Last rx was in November for only 30 pills.

## 2015-03-16 NOTE — Telephone Encounter (Signed)
Blue team, I now see that patient called requesting refill. Please still call her and tell her she should schedule an appointment to follow up on her lasix use. I will send in a temporary supply.   Leeanne Rio, MD

## 2015-03-16 NOTE — Telephone Encounter (Signed)
Covering for Dr. Caryl Bis who is away on vacation.  Received refill request for lasix. It appears she last had this prescribed in November 2015 for only thirty pills. Blue team, can you call patient and inquire if she is still taking this medication? If so, what is her dose and how often is she taking it.  Thanks, Leeanne Rio, MD

## 2015-03-17 ENCOUNTER — Encounter: Payer: Self-pay | Admitting: *Deleted

## 2015-03-17 NOTE — Telephone Encounter (Signed)
Letter mailed to patient to call and schedule a follow up appt. Deborah [name],CMA

## 2015-03-22 ENCOUNTER — Other Ambulatory Visit: Payer: Self-pay | Admitting: General Surgery

## 2015-03-22 ENCOUNTER — Other Ambulatory Visit: Payer: Self-pay | Admitting: *Deleted

## 2015-03-22 DIAGNOSIS — K810 Acute cholecystitis: Secondary | ICD-10-CM

## 2015-03-23 ENCOUNTER — Other Ambulatory Visit: Payer: Self-pay | Admitting: *Deleted

## 2015-03-23 ENCOUNTER — Telehealth: Payer: Self-pay | Admitting: Oncology

## 2015-03-23 DIAGNOSIS — K810 Acute cholecystitis: Secondary | ICD-10-CM

## 2015-03-23 NOTE — Telephone Encounter (Signed)
Deborah [name] left a message requesting a refill on diazepam 5 mg tablets.  She last had them filled on 02/16/15.  Her next follow up appointment is 06/09/15.

## 2015-03-24 ENCOUNTER — Telehealth: Payer: Self-pay | Admitting: Oncology

## 2015-03-24 NOTE — Telephone Encounter (Signed)
Called in refill to [facility] per Dr. Sondra Come for diazepam (VALIUM) 5 MG tablet. Take 5 mg by mouth every 8 (eight) hours as needed for anxiety. Disp. 30 tablets. 3 refills.  Called [place] and let her know that it had been called in.

## 2015-03-24 NOTE — Telephone Encounter (Signed)
Deborah [name] called again requested a refill on her valium.  She said that she is out of medication.  Advised her that Dr. Sondra Come will be contacted and we will call her when it is available.  Deborah [name] verbalized agreement.

## 2015-03-25 ENCOUNTER — Encounter (HOSPITAL_COMMUNITY): Payer: Self-pay | Admitting: Emergency Medicine

## 2015-03-25 ENCOUNTER — Emergency Department (HOSPITAL_COMMUNITY)
Admission: EM | Admit: 2015-03-25 | Discharge: 2015-03-26 | Disposition: A | Discharge status: Home / Self Care | Payer: Medicaid Other | Attending: Emergency Medicine | Admitting: Emergency Medicine

## 2015-03-25 DIAGNOSIS — M170 Bilateral primary osteoarthritis of knee: Secondary | ICD-10-CM | POA: Insufficient documentation

## 2015-03-25 DIAGNOSIS — R1011 Right upper quadrant pain: Secondary | ICD-10-CM | POA: Diagnosis present

## 2015-03-25 DIAGNOSIS — E669 Obesity, unspecified: Secondary | ICD-10-CM | POA: Insufficient documentation

## 2015-03-25 DIAGNOSIS — F329 Major depressive disorder, single episode, unspecified: Secondary | ICD-10-CM | POA: Diagnosis not present

## 2015-03-25 DIAGNOSIS — Z7982 Long term (current) use of aspirin: Secondary | ICD-10-CM | POA: Diagnosis not present

## 2015-03-25 DIAGNOSIS — F419 Anxiety disorder, unspecified: Secondary | ICD-10-CM | POA: Insufficient documentation

## 2015-03-25 DIAGNOSIS — Q681 Congenital deformity of finger(s) and hand: Secondary | ICD-10-CM | POA: Diagnosis not present

## 2015-03-25 DIAGNOSIS — Z923 Personal history of irradiation: Secondary | ICD-10-CM | POA: Diagnosis not present

## 2015-03-25 DIAGNOSIS — Z862 Personal history of diseases of the blood and blood-forming organs and certain disorders involving the immune mechanism: Secondary | ICD-10-CM | POA: Insufficient documentation

## 2015-03-25 DIAGNOSIS — Z8542 Personal history of malignant neoplasm of other parts of uterus: Secondary | ICD-10-CM | POA: Insufficient documentation

## 2015-03-25 DIAGNOSIS — Z79899 Other long term (current) drug therapy: Secondary | ICD-10-CM | POA: Diagnosis not present

## 2015-03-25 DIAGNOSIS — K8020 Calculus of gallbladder without cholecystitis without obstruction: Secondary | ICD-10-CM

## 2015-03-25 NOTE — ED Notes (Signed)
Pt requesting EKG for chest pain.  Pt pulled back to Triage Room and EKG obtained. Will attempt to draw labs.

## 2015-03-25 NOTE — ED Notes (Addendum)
Pt c/o RUQ abdominal pain x 2 hours. Pt has hx of gall stones and was seen about a month ago for the same. Pt sts this is the "exact same pain, I know what it is."  Pt sts the pain radiates into her R middle back and into her rib cage. Pt c/o tenderness to palpation to each area. A&Ox4 and ambulatory. Pt c/o nausea, no vomiting. Pt has follow up with surgery on May 10th.

## 2015-03-25 NOTE — ED Notes (Signed)
Pt still does not want her blood to be drawn. Pt states she wants an IV and Pain meds, and blood draw all at the same time

## 2015-03-25 NOTE — ED Notes (Signed)
Per RN Lauren, pt request no blood draw in triage.  Pt wants nurse to get blood when starting IV

## 2015-03-26 LAB — CBC WITH DIFFERENTIAL/PLATELET
Basophils Absolute: 0 10*3/uL (ref 0.0–0.1)
Basophils Relative: 0 % (ref 0–1)
EOS ABS: 0.1 10*3/uL (ref 0.0–0.7)
Eosinophils Relative: 3 % (ref 0–5)
HEMATOCRIT: 42.1 % (ref 36.0–46.0)
HEMOGLOBIN: 13.5 g/dL (ref 12.0–15.0)
Lymphocytes Relative: 26 % (ref 12–46)
Lymphs Abs: 1.3 10*3/uL (ref 0.7–4.0)
MCH: 29.3 pg (ref 26.0–34.0)
MCHC: 32.1 g/dL (ref 30.0–36.0)
MCV: 91.5 fL (ref 78.0–100.0)
MONO ABS: 0.5 10*3/uL (ref 0.1–1.0)
MONOS PCT: 10 % (ref 3–12)
NEUTROS ABS: 3 10*3/uL (ref 1.7–7.7)
Neutrophils Relative %: 61 % (ref 43–77)
Platelets: 248 10*3/uL (ref 150–400)
RBC: 4.6 MIL/uL (ref 3.87–5.11)
RDW: 13.8 % (ref 11.5–15.5)
WBC: 4.9 10*3/uL (ref 4.0–10.5)

## 2015-03-26 LAB — COMPREHENSIVE METABOLIC PANEL
ALBUMIN: 3.6 g/dL (ref 3.5–5.2)
ALT: 18 U/L (ref 0–35)
AST: 16 U/L (ref 0–37)
Alkaline Phosphatase: 67 U/L (ref 39–117)
Anion gap: 9 (ref 5–15)
BUN: 16 mg/dL (ref 6–23)
CALCIUM: 8.7 mg/dL (ref 8.4–10.5)
CO2: 26 mmol/L (ref 19–32)
CREATININE: 0.89 mg/dL (ref 0.50–1.10)
Chloride: 103 mmol/L (ref 96–112)
GFR calc Af Amer: 88 mL/min — ABNORMAL LOW (ref >90)
GFR calc non Af Amer: 76 mL/min — ABNORMAL LOW (ref >90)
Glucose, Bld: 98 mg/dL (ref 70–99)
Potassium: 3.1 mmol/L — ABNORMAL LOW (ref 3.5–5.1)
Sodium: 138 mmol/L (ref 135–145)
Total Bilirubin: 0.7 mg/dL (ref 0.3–1.2)
Total Protein: 7.9 g/dL (ref 6.0–8.3)

## 2015-03-26 LAB — LIPASE, BLOOD: LIPASE: 20 U/L (ref 11–59)

## 2015-03-26 MED ORDER — GI COCKTAIL ~~LOC~~
30.0000 mL | Freq: Once | ORAL | Status: AC
  Administered 2015-03-26: 30 mL via ORAL
  Filled 2015-03-26: qty 30

## 2015-03-26 MED ORDER — RANITIDINE HCL 150 MG/10ML PO SYRP
300.0000 mg | ORAL_SOLUTION | Freq: Once | ORAL | Status: AC
  Administered 2015-03-26: 300 mg via ORAL
  Filled 2015-03-26 (×2): qty 20

## 2015-03-26 MED ORDER — ALUM & MAG HYDROXIDE-SIMETH 200-200-20 MG/5ML PO SUSP
30.0000 mL | Freq: Once | ORAL | Status: DC
  Filled 2015-03-26: qty 30

## 2015-03-26 MED ORDER — POTASSIUM CHLORIDE CRYS ER 20 MEQ PO TBCR
40.0000 meq | EXTENDED_RELEASE_TABLET | Freq: Once | ORAL | Status: AC
  Administered 2015-03-26: 40 meq via ORAL
  Filled 2015-03-26: qty 2

## 2015-03-26 NOTE — ED Notes (Signed)
Pt still refuses lab draws. She states "that she does not want to speak to [place] right now".

## 2015-03-26 NOTE — ED Notes (Signed)
Pt speaking to lobby about how this RN "can go kill herself" Pt upset about being informed about awaiting discharges and states "you don't have to explain to me".

## 2015-03-26 NOTE — Discharge Instructions (Signed)
Cholelithiasis Ms. Knock, your blood work does not show any worsening of your gallbladder function. Follow-up with her general surgeon to get the HIDA scan.  Continue to take Motrin for pain as needed. If symptoms worsen come back to emergency department immediately. Thank you. Cholelithiasis (also called gallstones) is a form of gallbladder disease. The gallbladder is a small organ that helps you digest fats. Symptoms of gallstones are:  Feeling sick to your stomach (nausea).  Throwing up (vomiting).  Belly pain.  Yellowing of the skin (jaundice).  Sudden pain. You may feel the pain for minutes to hours.  Fever.  Pain to the touch. HOME CARE  Only take medicines as told by your doctor.  Eat a low-fat diet until you see your doctor again. Eating fat can result in pain.  Follow up with your doctor as told. Attacks usually happen time after time. Surgery is usually needed for permanent treatment. GET HELP RIGHT AWAY IF:   Your pain gets worse.  Your pain is not helped by medicines.  You have a fever and lasting symptoms for more than 2-3 days.  You have a fever and your symptoms suddenly get worse.  You keep feeling sick to your stomach and throwing up. MAKE SURE YOU:   Understand these instructions.  Will watch your condition.  Will get help right away if you are not doing well or get worse. Document Released: 04/30/2008 Document Revised: 07/15/2013 Document Reviewed: 05/06/2013 [facility] Patient Information 2015 [place], [place]. This information is not intended to replace advice given to you by your health care provider. Make sure you discuss any questions you have with your health care provider.

## 2015-03-26 NOTE — ED Provider Notes (Signed)
CSN: [id number]     Arrival date & time 03/25/15  2038 History   First MD Initiated Contact with Patient 03/26/15 0258     Chief Complaint  Patient presents with  . Abdominal Pain     (Consider location/radiation/quality/duration/timing/severity/associated sxs/prior Treatment) HPI Deborah [name] is a 49 y.o. female with past medical history of anxiety, depression, cholelithiasis presenting today with right upper quadrant pain. Patient states this is consistent with her gallbladder pain. It is a throbbing pain that radiates to her back. This occurred at rest. She states she has made the appropriate dietary changes. She has a hiatus scan coming up during the first week of May for further evaluation. Patient had nausea as well without any vomiting. She denies being nauseous currently. Her pain has improved although the dull ache is still there. She denies any changes in her bowel movements or urine. She has no vaginal complaints. Patient has no further complaints and denies any fevers.  10 Systems reviewed and are negative for acute change except as noted in the HPI.    Past Medical History  Diagnosis Date  . Congenital birth defect     Right hand  . Anemia   . Arthritis     knees  . Endometrial cancer   . Anxiety   . Swelling     ANKLES - TAKES LASIX  . Depression   . HLD (hyperlipidemia)     borderline  . Obesity   . Hx of radiation therapy 04/07/13- 05/14/13    pelvis 45 gray 25 fx  . Hx of echocardiogram     Echo (9/15): EF 50-55%, normal wall motion, grade 2 diastolic dysfunction, mild LAE   Past Surgical History  Procedure Laterality Date  . Hiatal hernia repair    . Repair vaginal cuff  10/07/2012    Procedure: REPAIR VAGINAL CUFF;  Surgeon: [name], MD PHD;  Location: [facility];  Service: Gynecology;;  . Robotic assisted total hysterectomy with bilateral salpingo oopherectomy  10/07/2012    Procedure: ROBOTIC ASSISTED TOTAL HYSTERECTOMY WITH BILATERAL SALPINGO  OOPHORECTOMY;  Surgeon: [name], MD PHD;  Location: [facility];  Service: Gynecology;  Laterality: Bilateral;  . Abdominal hysterectomy  10/2011    complete   Family History  Problem Relation Age of Onset  . Pancreatic cancer Maternal Grandfather     or liver cancer  . Hypertension Maternal Grandfather   . Cancer Maternal Grandfather     pancreatic  . Hypertension Maternal Grandmother   . Diabetes Mother   . Colon polyps Maternal Aunt   . Cancer Maternal Aunt     lung  . Colon cancer Cousin   . Cancer Cousin     lung  . Lung cancer Maternal Aunt   . Heart attack Neg Hx   . Stroke Cousin    History  Substance Use Topics  . Smoking status: Never Smoker   . Smokeless tobacco: Never Used  . Alcohol Use: Yes     Comment: rare   OB History    Gravida Para Term Preterm AB TAB SAB Ectopic Multiple Living   2 1 0 1 1 0 1 0 0 1      Review of Systems    Allergies  Review of patient's allergies indicates no known allergies.  Home Medications   Prior to Admission medications   Medication Sig Start Date End Date Taking? Authorizing Provider  acetaminophen (TYLENOL) 500 MG tablet Take 1,000 mg by mouth every 6 (six) hours as  needed for moderate pain (pain).    Historical Provider, MD  aspirin 81 MG tablet Take 81 mg by mouth every morning.     Historical Provider, MD  bifidobacterium infantis (ALIGN) capsule Take 1 capsule by mouth daily as needed (for regularity).  10/03/12   Jerene Bears, MD  carvedilol (COREG) 3.125 MG tablet Take 1 tablet in the am and 2 in the pm. 08/19/14   Minus Breeding, MD  cyclobenzaprine (FLEXERIL) 10 MG tablet Take 1 tablet (10 mg total) by mouth 3 (three) times daily as needed for muscle spasms. Patient not taking: Reported on 02/17/2015 09/29/14   Evelina Bucy, MD  diazepam (VALIUM) 5 MG tablet Take 5 mg by mouth every 8 (eight) hours as needed for anxiety. Disp. 30 tablets. 3 refills.  Called in to [facility] per Dr. Sondra Come. 03/24/15   Gery Pray, MD   docusate sodium (COLACE) 50 MG capsule Take 50 mg by mouth daily as needed for mild constipation.  11/09/13   Leone Haven, MD  furosemide (LASIX) 20 MG tablet Take 1 tablet (20 mg total) by mouth daily as needed for fluid. 03/16/15   Leeanne Rio, MD  HYDROcodone-acetaminophen (NORCO/VICODIN) 5-325 MG per tablet Take 2 tablets by mouth every 4 (four) hours as needed. 03/01/15   Tanna Furry, MD  ibuprofen (ADVIL,MOTRIN) 800 MG tablet Take 1 tablet (800 mg total) by mouth every 8 (eight) hours as needed. 09/29/14   Evelina Bucy, MD  lactase (LACTAID) 3000 UNITS tablet Take 1 tablet by mouth 3 (three) times daily as needed (for dairy intake).  10/03/12   Jerene Bears, MD  loperamide (IMODIUM) 2 MG capsule Take 2 mg by mouth 4 (four) times daily as needed for diarrhea or loose stools (diarrhea).     Historical Provider, MD  magnesium chloride (SLOW-MAG) 64 MG TBEC SR tablet Take 2 tablets by mouth 2 (two) times daily. 12/02/13   Leone Haven, MD  mometasone (NASONEX) 50 MCG/ACT nasal spray Place 2 sprays into the nose daily. One spray in each nostril. Patient taking differently: Place 2 sprays into the nose as needed (nasal congestion). One spray in each nostril. 11/09/13   Leone Haven, MD  omeprazole (PRILOSEC) 20 MG capsule Take 1 capsule (20 mg total) by mouth 2 (two) times daily. 03/01/15   Tanna Furry, MD  ondansetron (ZOFRAN ODT) 4 MG disintegrating tablet Take 1 tablet (4 mg total) by mouth every 8 (eight) hours as needed for nausea. 03/01/15   Tanna Furry, MD  OVER THE COUNTER MEDICATION Allergy Plus Sinus Headache- acetaminophen 325, diphenhydramine 12.5, phenylephrine 5 mg    Historical Provider, MD  OVER THE COUNTER MEDICATION Take 5 mg by mouth as needed (ALLERGY / SINUS). Medi-phenyl 5 mg    Historical Provider, MD  oxyCODONE-acetaminophen (PERCOCET) 5-325 MG per tablet Take 1 tablet by mouth every 6 (six) hours as needed for moderate pain. 02/05/15   Evelina Bucy, MD  polyethylene  glycol powder (GLYCOLAX/MIRALAX) powder Take 17 g by mouth daily as needed. Patient taking differently: Take 17 g by mouth daily as needed for mild constipation.  11/10/13   Leone Haven, MD  potassium chloride (K-DUR) 10 MEQ tablet Take two tablets twice a day or as directed. Patient taking differently: Take 20 mEq by mouth 2 (two) times daily. Take two tablets twice a day or as directed. 11/30/14   Leone Haven, MD  sertraline (ZOLOFT) 100 MG tablet take 1 tablet by mouth once  daily Patient taking differently: Take 100 mg by mouth daily. take 1 tablet by mouth once daily 06/02/14   Leone Haven, MD   BP 123/97 mmHg  Pulse 74  Temp(Src) 98.1 F (36.7 C) (Oral)  Resp 18  SpO2 93%  LMP 09/20/2012 Physical Exam  Constitutional: She is oriented to person, place, and time. She appears well-developed and well-nourished. No distress.  HENT:  Head: Normocephalic and atraumatic.  Nose: Nose normal.  Mouth/Throat: Oropharynx is clear and moist. No oropharyngeal exudate.  Eyes: Conjunctivae and EOM are normal. Pupils are equal, round, and reactive to light. No scleral icterus.  Neck: Normal range of motion. Neck supple. No JVD present. No tracheal deviation present. No thyromegaly present.  Cardiovascular: Normal rate, regular rhythm and normal heart sounds.  Exam reveals no gallop and no friction rub.   No murmur heard. Pulmonary/Chest: Effort normal and breath sounds normal. No respiratory distress. She has no wheezes. She exhibits no tenderness.  Abdominal: Soft. Bowel sounds are normal. She exhibits no distension and no mass. There is no tenderness. There is no rebound and no guarding.  Musculoskeletal: Normal range of motion. She exhibits no edema or tenderness.  Lymphadenopathy:    She has no cervical adenopathy.  Neurological: She is alert and oriented to person, place, and time. No cranial nerve deficit. She exhibits normal muscle tone.  Skin: Skin is warm and dry. No rash  noted. She is not diaphoretic. No erythema. No pallor.  Nursing note and vitals reviewed.   ED Course  Procedures (including critical care time) Labs Review Labs Reviewed  COMPREHENSIVE METABOLIC PANEL - Abnormal; Notable for the following:    Potassium 3.1 (*)    GFR calc non Af Amer 76 (*)    GFR calc Af Amer 88 (*)    All other components within normal limits  CBC WITH DIFFERENTIAL/PLATELET  LIPASE, BLOOD  URINALYSIS, ROUTINE W REFLEX MICROSCOPIC  CBC WITH DIFFERENTIAL/PLATELET    Imaging Review No results found.   EKG Interpretation   Date/Time:  Friday March 25 2015 23:50:21 EDT Ventricular Rate:  98 PR Interval:  133 QRS Duration: 89 QT Interval:  359 QTC Calculation: 458 R Axis:   22 Text Interpretation:  Sinus tachycardia Atrial premature complex  tachycardia now present Confirmed by Glynn Octave [phone]) on  03/26/2015 3:43:59 AM      MDM   Final diagnoses:  None    Patient since emergency department for right upper quadrant abdominal pain. She states is consistent with a typical gallbladder pain. Patient overall appears well in no acute distress. I doubt cholecystitis. We'll obtain laboratory studies to ensure there is no elevation in her LFTs. She was given GI cocktail as per request.  Laboratory studies at the patient's baseline. Potassium is low and was replaced in the emergency department. Pain is improved with GI cocktail. She is advised to follow-up during her general surgeon for scheduled HIDA scan continued management. Her vital signs were within her normal limits and she is safe for discharge.    Everlene Balls, MD 03/26/15 [phone]

## 2015-03-26 NOTE — ED Notes (Signed)
Pt. Unable to give urine at this time. 

## 2015-03-31 ENCOUNTER — Telehealth: Payer: Self-pay | Admitting: Family Medicine

## 2015-03-31 ENCOUNTER — Encounter: Payer: Self-pay | Admitting: *Deleted

## 2015-03-31 MED ORDER — OMEPRAZOLE 20 MG PO CPDR: 20.0000 mg | DELAYED_RELEASE_CAPSULE | Freq: Two times a day (BID) | ORAL | Status: DC

## 2015-03-31 NOTE — Telephone Encounter (Signed)
Refill request. Will forward to PCP for review. Terrelle Ruffolo, CMA. 

## 2015-03-31 NOTE — Telephone Encounter (Signed)
Pt called and needs a refill on her Prilosec called in. jw

## 2015-03-31 NOTE — Telephone Encounter (Signed)
Patient had this filled in the ED previously. I will refill for one month, though she will need follow-up if continues to need prilosec.

## 2015-03-31 NOTE — Telephone Encounter (Signed)
Letter mailed to pt today. Florencia Zaccaro, CMA.

## 2015-04-04 ENCOUNTER — Other Ambulatory Visit: Payer: Self-pay | Admitting: Family Medicine

## 2015-04-04 DIAGNOSIS — C541 Malignant neoplasm of endometrium: Secondary | ICD-10-CM

## 2015-04-04 MED ORDER — POTASSIUM CHLORIDE ER 10 MEQ PO TBCR: 20.0000 meq | EXTENDED_RELEASE_TABLET | Freq: Two times a day (BID) | ORAL | Status: DC

## 2015-04-04 MED ORDER — SERTRALINE HCL 100 MG PO TABS: 100.0000 mg | ORAL_TABLET | Freq: Every day | ORAL | Status: DC

## 2015-04-04 NOTE — Telephone Encounter (Signed)
Pt called and needs a refill on her Potassium medication called in. jw

## 2015-04-04 NOTE — Telephone Encounter (Signed)
Refill given. Patient will need follow-up for depression in the next 1-2 months. Please inform patient. Thanks.

## 2015-04-05 ENCOUNTER — Ambulatory Visit (HOSPITAL_COMMUNITY)
Admission: RE | Admit: 2015-04-05 | Discharge: 2015-04-05 | Disposition: A | Discharge status: Home / Self Care | Payer: Medicaid Other | Source: Ambulatory Visit | Attending: General Surgery | Admitting: General Surgery

## 2015-04-05 DIAGNOSIS — K810 Acute cholecystitis: Secondary | ICD-10-CM | POA: Insufficient documentation

## 2015-04-05 NOTE — Telephone Encounter (Signed)
Letter mailed to patient. Jazmin Hartsell,CMA  

## 2015-04-06 ENCOUNTER — Other Ambulatory Visit: Payer: Self-pay | Admitting: General Surgery

## 2015-04-08 ENCOUNTER — Other Ambulatory Visit: Payer: Self-pay | Admitting: *Deleted

## 2015-04-08 DIAGNOSIS — K828 Other specified diseases of gallbladder: Secondary | ICD-10-CM

## 2015-04-08 DIAGNOSIS — I878 Other specified disorders of veins: Secondary | ICD-10-CM

## 2015-04-18 ENCOUNTER — Other Ambulatory Visit: Payer: Self-pay | Admitting: Physician Assistant

## 2015-04-19 ENCOUNTER — Ambulatory Visit (HOSPITAL_COMMUNITY)
Admission: RE | Admit: 2015-04-19 | Discharge: 2015-04-19 | Disposition: A | Discharge status: Home / Self Care | Payer: Medicaid Other | Source: Ambulatory Visit | Attending: General Surgery | Admitting: General Surgery

## 2015-04-19 DIAGNOSIS — I878 Other specified disorders of veins: Secondary | ICD-10-CM

## 2015-04-19 DIAGNOSIS — K810 Acute cholecystitis: Secondary | ICD-10-CM

## 2015-04-19 DIAGNOSIS — R1011 Right upper quadrant pain: Secondary | ICD-10-CM | POA: Insufficient documentation

## 2015-04-19 DIAGNOSIS — R112 Nausea with vomiting, unspecified: Secondary | ICD-10-CM | POA: Diagnosis not present

## 2015-04-19 DIAGNOSIS — K828 Other specified diseases of gallbladder: Secondary | ICD-10-CM

## 2015-04-19 MED ORDER — LIDOCAINE HCL 1 % IJ SOLN
INTRAMUSCULAR | Status: AC
  Filled 2015-04-19: qty 20

## 2015-04-19 MED ORDER — TECHNETIUM TC 99M MEBROFENIN IV KIT
5.5000 | PACK | Freq: Once | INTRAVENOUS | Status: AC | PRN
  Administered 2015-04-19: 6 via INTRAVENOUS

## 2015-04-19 MED ORDER — SINCALIDE 5 MCG IJ SOLR
0.0200 ug/kg | Freq: Once | INTRAMUSCULAR | Status: AC
  Administered 2015-04-19: 2 ug via INTRAVENOUS

## 2015-04-19 MED ORDER — TECHNETIUM TC 99M MEBROFENIN IV KIT
5.5000 | PACK | Freq: Once | INTRAVENOUS | Status: AC | PRN
  Administered 2015-04-19: 5.5 via INTRAVENOUS

## 2015-04-19 NOTE — Procedures (Signed)
4 french angiocath placed into left brachial vein as peripheral access prior to planned HIDA scan. Cath secured to skin and flushed without difficulty. No immediate complications.

## 2015-04-22 ENCOUNTER — Ambulatory Visit: Payer: Self-pay | Admitting: General Surgery

## 2015-04-27 ENCOUNTER — Other Ambulatory Visit: Payer: Self-pay

## 2015-04-27 ENCOUNTER — Telehealth: Payer: Self-pay | Admitting: Cardiology

## 2015-04-27 MED ORDER — CARVEDILOL 3.125 MG PO TABS: ORAL_TABLET | ORAL | Status: DC

## 2015-04-27 NOTE — Telephone Encounter (Signed)
°  1. Which medications need to be refilled? Carvedilol  2. Which pharmacy is medication to be sent to? [facility] [phone]  3. Do they need a 30 day or 90 day supply? 90  4. Would they like a call back once the medication has been sent to the pharmacy? yes

## 2015-05-01 ENCOUNTER — Ambulatory Visit: Payer: Self-pay | Admitting: General Surgery

## 2015-05-03 ENCOUNTER — Ambulatory Visit (HOSPITAL_BASED_OUTPATIENT_CLINIC_OR_DEPARTMENT_OTHER): Payer: Medicaid Other

## 2015-05-03 ENCOUNTER — Encounter (HOSPITAL_BASED_OUTPATIENT_CLINIC_OR_DEPARTMENT_OTHER): Payer: Medicaid Other

## 2015-05-05 ENCOUNTER — Ambulatory Visit (INDEPENDENT_AMBULATORY_CARE_PROVIDER_SITE_OTHER): Payer: Medicaid Other | Admitting: Family Medicine

## 2015-05-05 ENCOUNTER — Encounter: Payer: Self-pay | Admitting: Family Medicine

## 2015-05-05 VITALS — BP 122/54 | HR 97 | Temp 98.4°F | Ht 66.0 in | Wt 326.0 lb

## 2015-05-05 DIAGNOSIS — K8020 Calculus of gallbladder without cholecystitis without obstruction: Secondary | ICD-10-CM | POA: Diagnosis not present

## 2015-05-05 DIAGNOSIS — M545 Low back pain, unspecified: Secondary | ICD-10-CM

## 2015-05-05 DIAGNOSIS — K921 Melena: Secondary | ICD-10-CM | POA: Diagnosis not present

## 2015-05-05 DIAGNOSIS — M170 Bilateral primary osteoarthritis of knee: Secondary | ICD-10-CM

## 2015-05-05 DIAGNOSIS — R350 Frequency of micturition: Secondary | ICD-10-CM

## 2015-05-05 DIAGNOSIS — R1011 Right upper quadrant pain: Secondary | ICD-10-CM

## 2015-05-05 LAB — POCT URINALYSIS DIPSTICK
BILIRUBIN UA: NEGATIVE
Glucose, UA: NEGATIVE
Ketones, UA: NEGATIVE
Leukocytes, UA: NEGATIVE
Nitrite, UA: NEGATIVE
RBC UA: NEGATIVE
Spec Grav, UA: 1.025
Urobilinogen, UA: 0.2
pH, UA: 5.5

## 2015-05-05 MED ORDER — IBUPROFEN 800 MG PO TABS: 800.0000 mg | ORAL_TABLET | Freq: Three times a day (TID) | ORAL | Status: DC | PRN

## 2015-05-05 MED ORDER — OMEPRAZOLE 20 MG PO CPDR: 20.0000 mg | DELAYED_RELEASE_CAPSULE | Freq: Two times a day (BID) | ORAL | Status: DC

## 2015-05-05 NOTE — Patient Instructions (Signed)
Nice to see you. We are going to refer you to another ortho office.  Please go get the x-ray of your back. If you develop fever, nausea, vomiting, abdominal pain, diarrhea, numbness, weakness, or bladder or bowel incontinence please seek medical attention.

## 2015-05-06 LAB — COMPREHENSIVE METABOLIC PANEL
ALBUMIN: 3.7 g/dL (ref 3.5–5.2)
ALT: 15 U/L (ref 0–35)
AST: 14 U/L (ref 0–37)
Alkaline Phosphatase: 75 U/L (ref 39–117)
BILIRUBIN TOTAL: 0.3 mg/dL (ref 0.2–1.2)
BUN: 11 mg/dL (ref 6–23)
CO2: 25 mEq/L (ref 19–32)
CREATININE: 0.82 mg/dL (ref 0.50–1.10)
Calcium: 8.9 mg/dL (ref 8.4–10.5)
Chloride: 102 mEq/L (ref 96–112)
GLUCOSE: 97 mg/dL (ref 70–99)
POTASSIUM: 3.6 meq/L (ref 3.5–5.3)
SODIUM: 140 meq/L (ref 135–145)
TOTAL PROTEIN: 7.5 g/dL (ref 6.0–8.3)

## 2015-05-06 LAB — MAGNESIUM: MAGNESIUM: 1.2 mg/dL — AB (ref 1.5–2.5)

## 2015-05-06 LAB — LIPASE: Lipase: 18 U/L (ref 0–75)

## 2015-05-07 IMAGING — CT CT ABD-PELV W/ CM
2 of 5 series · 17 of 46 positions shown, 19 images · IV contrast (OMNIPAQUE)
Comparison: 11/25/2012

CLINICAL DATA: FOLLOW UP endometrial carcinoma. Restaging. Previous
chemotherapy and radiation therapy.

EXAM:
CT ABDOMEN AND PELVIS WITH CONTRAST
TECHNIQUE: Multidetector CT imaging of the abdomen and pelvis was performed
using the standard protocol following bolus administration of
intravenous contrast.
CONTRAST:  100mL OMNIPAQUE IOHEXOL 300 MG/ML  SOLN

[Series 2: rtn a/p with · axial · 0.74mm/px · z∈[-526,-76]mm · 14 of 100 slices shown, 16 images]
[im 5/100  soft-tissue]
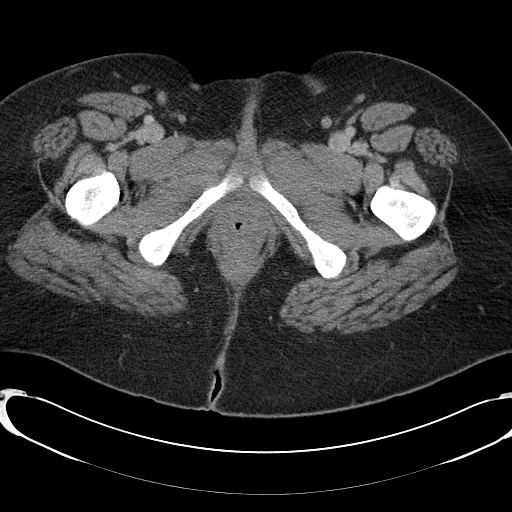
[im 5/100  bone]
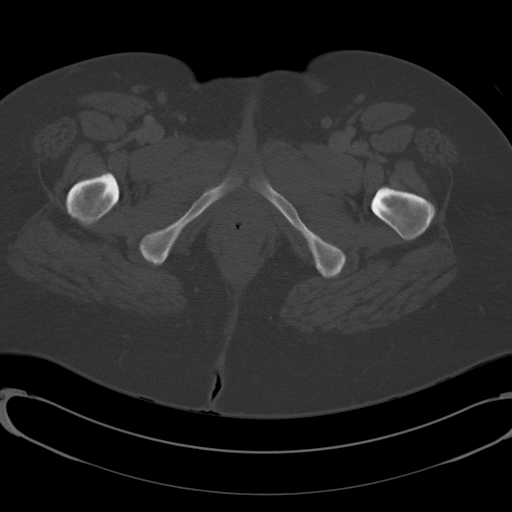
[im 13/100  soft-tissue]
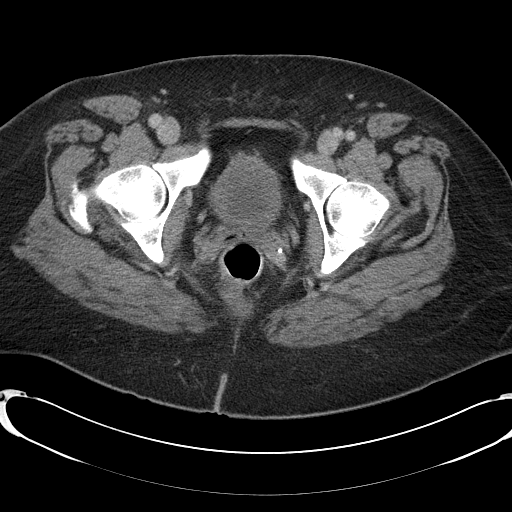
[im 21/100  soft-tissue]
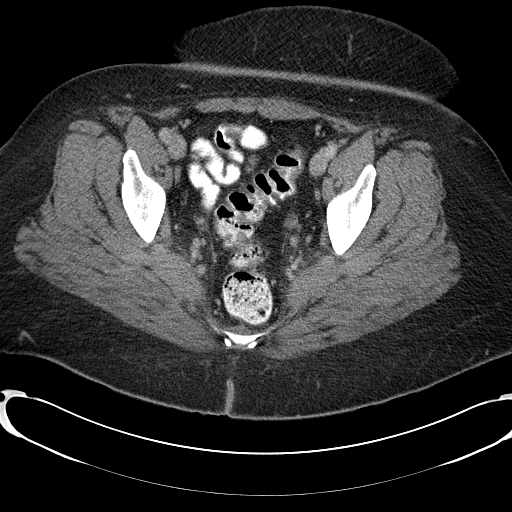
[im 25/100  soft-tissue]
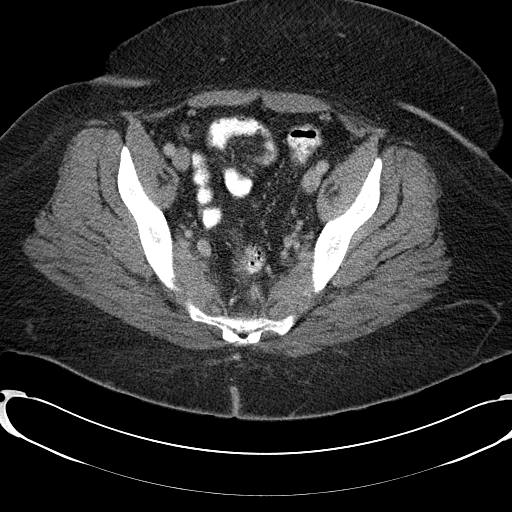
[im 34/100  soft-tissue]
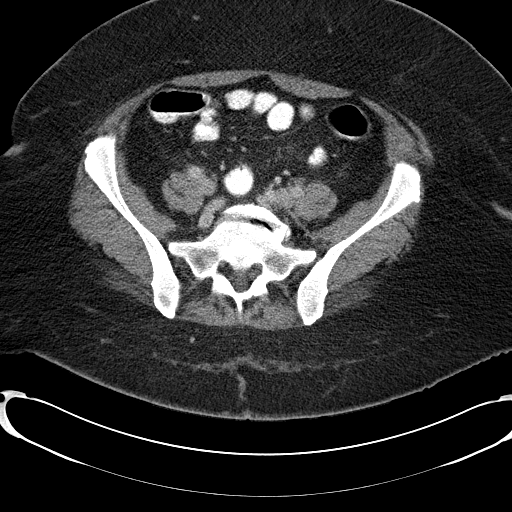
[im 42/100  soft-tissue]
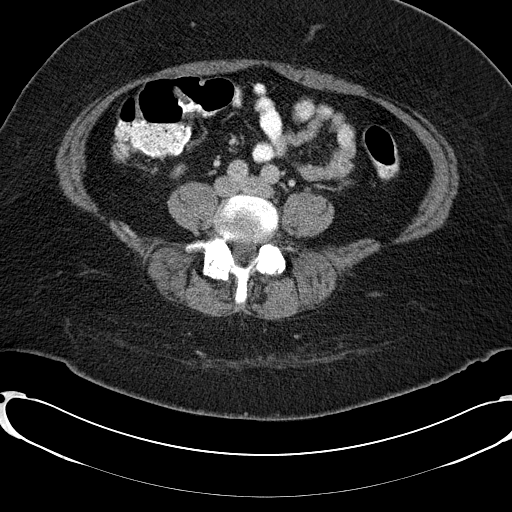
[im 46/100  soft-tissue]
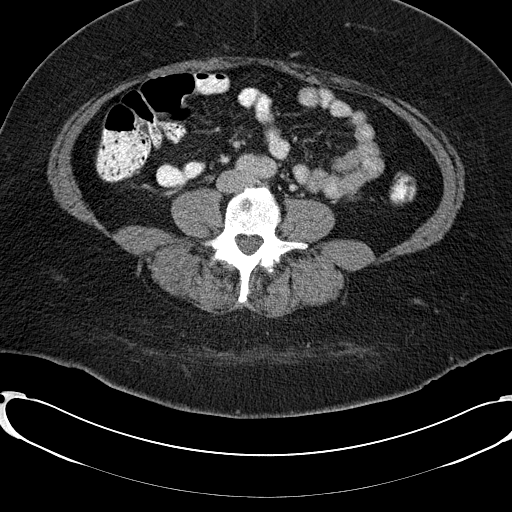
[im 54/100  soft-tissue]
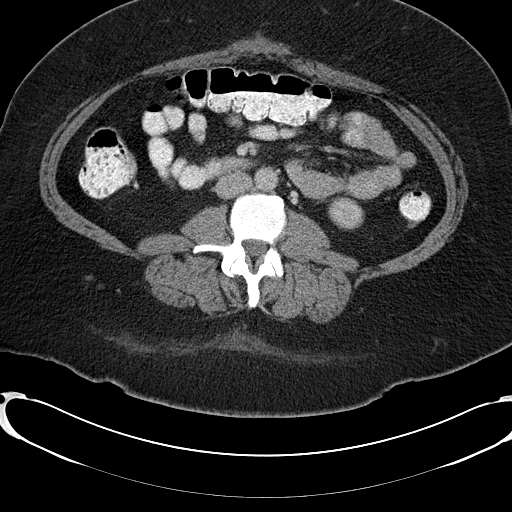
[im 58/100  soft-tissue]
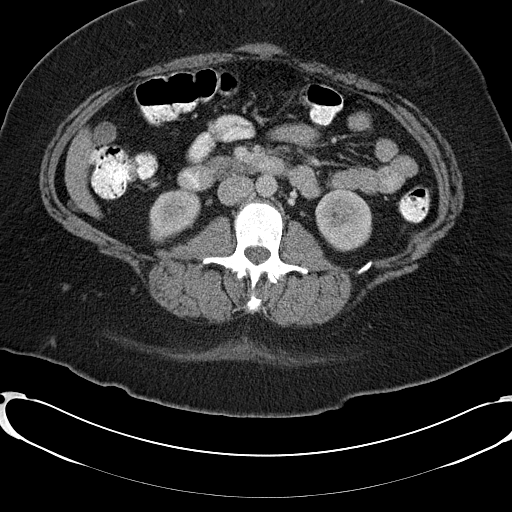
[im 58/100  bone]
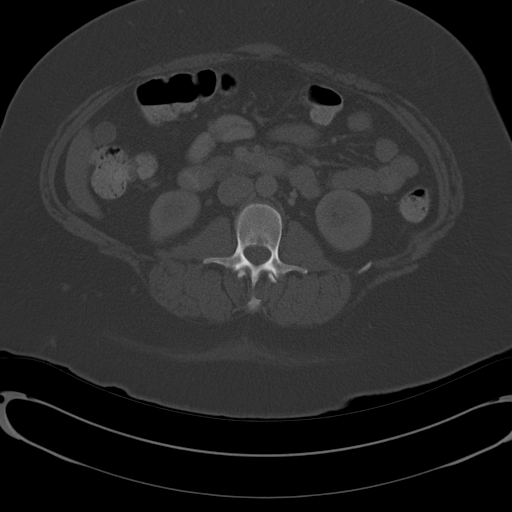
[im 67/100  soft-tissue]
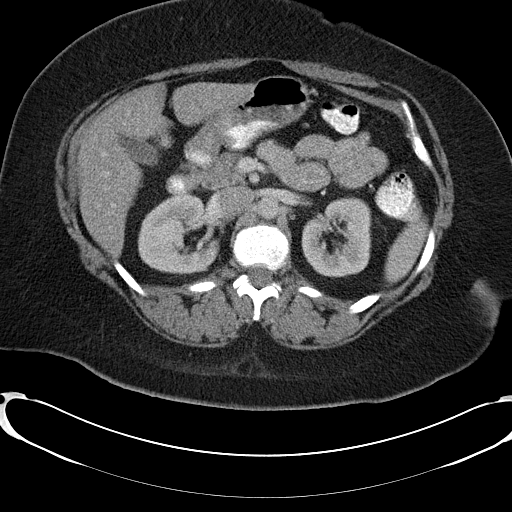
[im 75/100  soft-tissue]
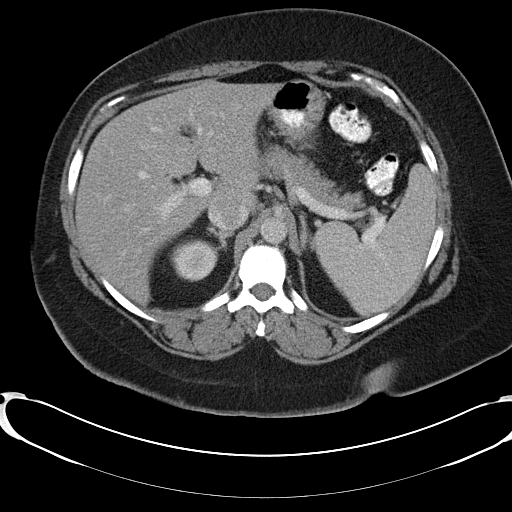
[im 79/100  soft-tissue]
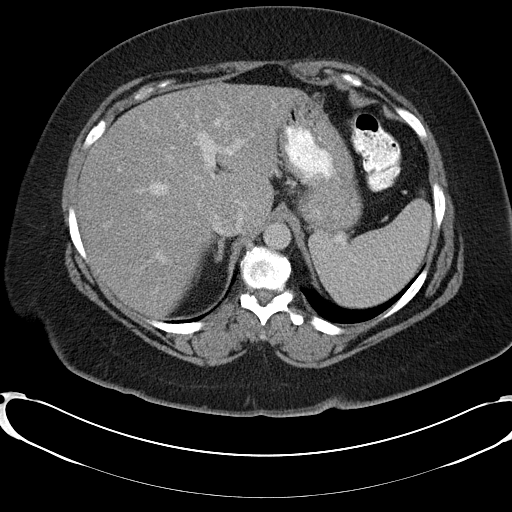
[im 87/100  soft-tissue]
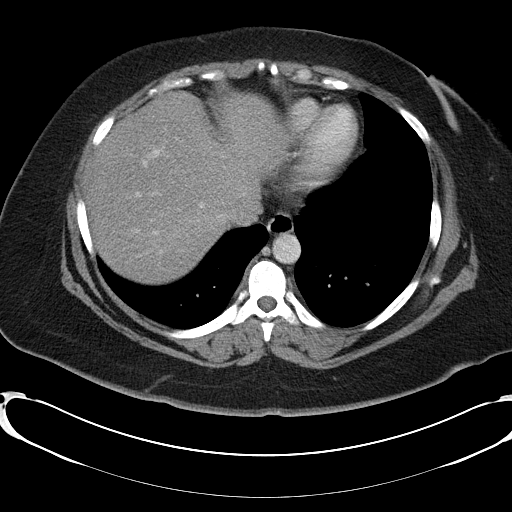
[im 95/100  soft-tissue]
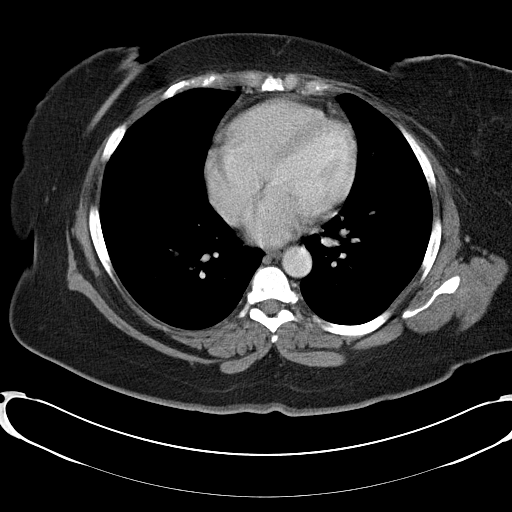

[Series 602: <mpr thick range> · coronal · 0.98mm/px · 3 of 84 slices shown]
[im 21/84  soft-tissue]
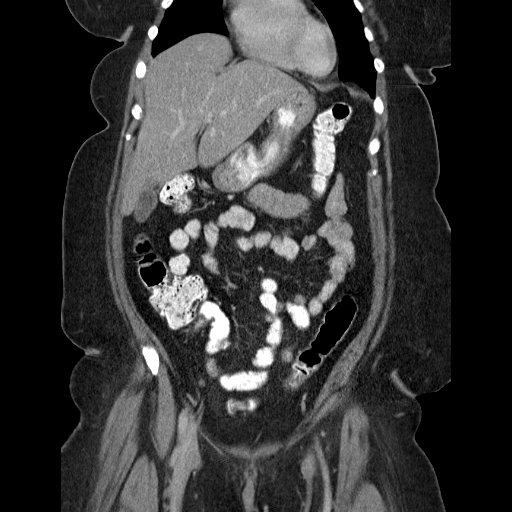
[im 42/84  soft-tissue]
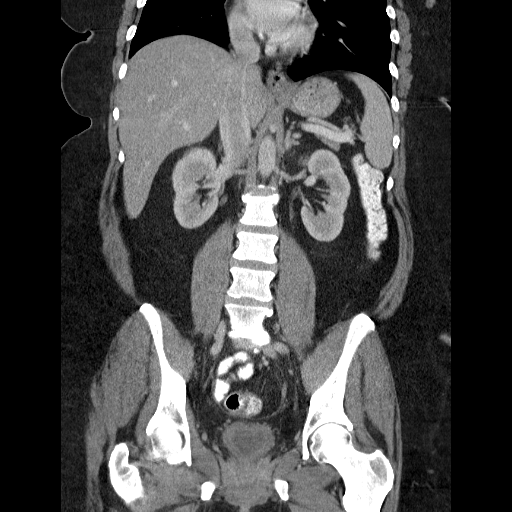
[im 63/84  soft-tissue]
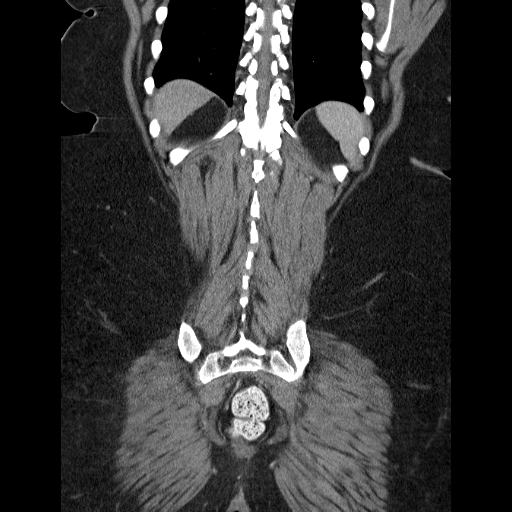

[17 of 46 positions shown; findings below may reference images not displayed]

FINDINGS: The liver, gallbladder, pancreas, spleen, adrenal glands, and
kidneys are normal in appearance. No evidence of hydronephrosis.

Prior hysterectomy noted. Adnexal regions are unremarkable in
appearance.

No evidence of abdominal lymphadenopathy. Mild bilateral external
iliac lymphadenopathy has decreased since previous study. Largest
lymph node in the left external iliac chain currently measures 7 mm
on image 84 compared to 9 mm previously, and right external iliac
node measures 6 mm on image 85 compared to 9 mm previously. No new
or increased sites of lymphadenopathy identified. Previously seen
fluid collection along the proximal left external iliac chain has
now resolved. No evidence of inflammatory process, abscess, or
ascites. No suspicious bone lesions identified.
IMPRESSION: Decreased bilateral external iliac lymphadenopathy.

No new or progressive disease within the abdomen or pelvis.

## 2015-05-09 ENCOUNTER — Encounter: Payer: Self-pay | Admitting: Family Medicine

## 2015-05-09 DIAGNOSIS — R350 Frequency of micturition: Secondary | ICD-10-CM | POA: Insufficient documentation

## 2015-05-09 NOTE — Assessment & Plan Note (Signed)
Patient with several weeks of urinary frequency and mild urgency. Will check UA and glucose on CMET to evaluate for infection and DM. Could be urge sensation relating to prior radiation treatment. Given return precautions.

## 2015-05-09 NOTE — Assessment & Plan Note (Signed)
Patient with continued knee pain due to OA. Will prescribe ibuprofen 800 mg for her discomfort. Will also refer patient to ortho for consideration of knee replacements in the future.

## 2015-05-09 NOTE — Assessment & Plan Note (Signed)
Sounds like patient had biliary colic day prior to office visit. No pain no. Normal abdominal exam. Will obtain CMET and lipase. Discussed that if she has recurrence she needs to get evaluated right away. Plan for surgery moving forward. Given return precautions.

## 2015-05-09 NOTE — Assessment & Plan Note (Signed)
Patient with known degenerative changes in her back. Likely pain represents muscular strain. Neurologically intact. Appears to be stable lumbar spine pain with possible muscle strain, though it has been 2 years since last imaging study and has history of cancer. Will obtain lumbar spine film to evaluate further for bony abnormalities. Given return precautions.

## 2015-05-09 NOTE — Progress Notes (Signed)
Patient ID: Deborah [name], female   DOB: 1966/10/22, 49 y.o.   MRN: [id number]  Tommi Rumps, MD Phone: [phone]  Deborah [name] is a 49 y.o. female who presents today for f/u.  Bilateral Knee OA: notes knees continue to hurt. Has been taking 800 mg ibuprofen for this with benefit. Pain is worse with weight bearing. No swelling. Some days are worse than others. Previously saw [facility], though could not return there due to an outstanding bill.   Abdominal pain: notes RUQ abdominal pain starting yesterday at 2 am. Was cramping in nature. Had pain for about 5 hours. Has gall stones and is waiting to be scheduled for surgery. No pain at this time. No fever, no Nausea, vomiting, or diarrhea. No radiation of pain.   Urinary frequency: notes increased urine frequency over the last several weeks. No dysuria. Some urgency. No odor. Some low back pain, though no flank pain. Is concerned for bladder infection. No history of DM. No vaginal discharge. No abdominal pain at this time. No abdominal pain with this over the past several weeks.   Lumbar spine pain: notes sharop low back pain exacerbated by movement. Hurts more when she rolls over in bed in the morning. Notes this is a chronic issue. No radiation. No weakness, numbness, saddle anesthesia, fever, bowel or bladder incontinence. Has history of cancer.   PMH: nonsmoker.   ROS: Per HPI   Physical Exam Filed Vitals:   05/05/15 1632  BP: 122/54  Pulse: 97  Temp: 98.4 F (36.9 C)    Gen: Well NAD HEENT: PERRL,  MMM Lungs: CTABL Nl WOB Heart: RRR, no murmur appreciated Abd: soft, NT, ND, no guarding or rebound, negative murphy MSK: knee exam difficult given body habitus, though no apparent swelling, no erythema, ligaments intact, no midline spine tenderness, no tenderness of low back musculature Neuro: 5/5 strength in bilateral quads, hamstrings, plantar and dorsiflexion, sensation to light touch intact in bilateral LE, normal gait,  2+ patellar reflexes Exts: Non edematous BL  LE, warm and well perfused.    Assessment/Plan: Please see individual problem list.  Tommi Rumps, MD [place] PGY-3

## 2015-05-09 NOTE — Assessment & Plan Note (Addendum)
Patient denies issues with blood in stool at this time. No weight loss. Reports she was supposed to have a colonoscopy in the past, though this was around the time that she was being treated for her endometrial cancer. She saw GI and had colonoscopy scheduled, though had to cancel this on multiple occasions and did not reschedule this. Given that she has not yet had her colonoscopy will refer back to GI for evaluation.

## 2015-05-10 NOTE — Progress Notes (Signed)
I was preceptor the day of this visit.   

## 2015-05-13 ENCOUNTER — Telehealth: Payer: Self-pay | Admitting: Family Medicine

## 2015-05-13 NOTE — Telephone Encounter (Signed)
Called patient to discuss lab results. Has had persistent low magnesium despite treatment and is lower today than previously. I wanted to discuss looking in to this further with her with more lab work. Left a message asking her to call back to the office.

## 2015-05-16 NOTE — Telephone Encounter (Signed)
Pt is returning call from last Friday, says a nurse can call back if they have details as to what Dr. Caryl Bis needed.

## 2015-05-17 NOTE — Telephone Encounter (Signed)
Called patient to discuss magnesium. Advised that it is lower than previously. It had been 1.4, now 1.2. Patient reports she is taking 2 slow mag tablets that are OTC. States had issues with diarrhea with this previously, though this improved. No diarrhea at this time. Denies palpitations or chest pain. Will have patient increase slow mag to 2 tablets in am and 1 tablet in Pm at this time. Discussed that we would need to recheck this and perform urine studies to further evaluate this. Patient will call back for a lab appointment per her request. Given return precautions.

## 2015-05-17 NOTE — Telephone Encounter (Signed)
Spoke with patient and she is aware of results.  She will call back at the end of the week to schedule a lab appt.  Please place future orders for these.  Lab results printed and placed up front for patient. Jazmin Hartsell,CMA

## 2015-05-31 ENCOUNTER — Telehealth: Payer: Self-pay | Admitting: Oncology

## 2015-05-31 ENCOUNTER — Telehealth: Payer: Self-pay | Admitting: Family Medicine

## 2015-05-31 NOTE — Telephone Encounter (Signed)
Deborah [name] called and said she needs to reschedule her follow up with Dr. Sondra Come on 7/14 because she is going to have her gallbladder taken out on 7/13.  Transferred her to Earleen Newport, Medical Secretary to reschedule the appointment.

## 2015-05-31 NOTE — Telephone Encounter (Signed)
Spoke with patient and she states that she was actually seen by [facility] years ago.  Patient would like to return to their practice for her GI concerns.  She doesn't remember who she saw there but will look through her paperwork and see if she can find a name.  Will forward to [facility] to make her aware of this request and to see if there is anyway patient can get back in with them. Gerrie Castiglia,CMA

## 2015-05-31 NOTE — Telephone Encounter (Signed)
Pt called because she needs to talk Jazmin about her referrals. jw

## 2015-06-01 NOTE — Telephone Encounter (Signed)
Pt was seen in 2013 at [facility]. Referral placed back in [facility] for scheduling.

## 2015-06-02 ENCOUNTER — Encounter (HOSPITAL_BASED_OUTPATIENT_CLINIC_OR_DEPARTMENT_OTHER): Payer: Medicaid Other

## 2015-06-03 ENCOUNTER — Other Ambulatory Visit: Payer: Medicaid Other

## 2015-06-06 ENCOUNTER — Encounter (HOSPITAL_COMMUNITY): Payer: Self-pay | Admitting: *Deleted

## 2015-06-06 ENCOUNTER — Other Ambulatory Visit: Payer: Self-pay | Admitting: Family Medicine

## 2015-06-06 DIAGNOSIS — C541 Malignant neoplasm of endometrium: Secondary | ICD-10-CM

## 2015-06-06 MED ORDER — POTASSIUM CHLORIDE ER 10 MEQ PO TBCR: 20.0000 meq | EXTENDED_RELEASE_TABLET | Freq: Two times a day (BID) | ORAL | Status: DC

## 2015-06-06 NOTE — Progress Notes (Signed)
Cancelled Sleep study test for 06-02-15 plans to reschedule later.

## 2015-06-06 NOTE — Telephone Encounter (Signed)
t called and needs a refill on her Potassium called in. jw

## 2015-06-07 ENCOUNTER — Other Ambulatory Visit (HOSPITAL_COMMUNITY)
Admission: RE | Admit: 2015-06-07 | Discharge: 2015-06-07 | Disposition: A | Discharge status: Home / Self Care | Payer: Medicaid Other | Source: Ambulatory Visit | Attending: Radiation Oncology | Admitting: Radiation Oncology

## 2015-06-07 ENCOUNTER — Ambulatory Visit
Admission: RE | Admit: 2015-06-07 | Discharge: 2015-06-07 | Disposition: A | Discharge status: Home / Self Care | Payer: Medicaid Other | Source: Ambulatory Visit | Attending: Radiation Oncology | Admitting: Radiation Oncology

## 2015-06-07 ENCOUNTER — Encounter: Payer: Self-pay | Admitting: Radiation Oncology

## 2015-06-07 VITALS — BP 112/72 | HR 87 | Temp 98.2°F | Resp 20 | Ht 66.0 in | Wt 325.3 lb

## 2015-06-07 DIAGNOSIS — C541 Malignant neoplasm of endometrium: Secondary | ICD-10-CM

## 2015-06-07 DIAGNOSIS — Z01419 Encounter for gynecological examination (general) (routine) without abnormal findings: Secondary | ICD-10-CM | POA: Insufficient documentation

## 2015-06-07 NOTE — Progress Notes (Signed)
Pap specimen obtained from Dr. Sondra Come, patient tolerated well, sent specimen to the lab, patient given 6 month f/u appt card,   9:27 AM

## 2015-06-07 NOTE — Progress Notes (Signed)
Radiation Oncology         [phone] ________________________________  Name: Deborah [name] MRN: [id number]  Date: 06/07/2015  DOB: 1966-02-24  Follow-Up Visit Note  CC: Paula Compton, MD  Gordy Levan, MD    ICD-9-CM ICD-10-CM   1. Endometrial cancer 182.0 C54.1 Cytology - PAP    Diagnosis: Stage IIIC grade 2 endometrial adenocarcinoma    Interval Since Last Radiation:  2 years and 1 month, she completed external beam radiation therapy to 45 gray directed at the pelvis area   Narrative:  The patient returns today for routine follow-up.  Patient is been having right upper quadrant pain associated with eating. Patient is scheduled for cholecystectomy later this week by Dr. Hulen Skains.  She denies any pain within the pelvis area hematuria or vaginal bleeding or rectal bleeding. She denies any problems with her bowels. The patient continues to use her vaginal dilator twice a week. She does not notice any bleeding with use of her vaginal dilator.     Patient continues to have overall anxiety but is only using her Valium approximately once per day.                        ALLERGIES:  has No Known Allergies.  Meds: Current Outpatient Prescriptions  Medication Sig Dispense Refill  . acetaminophen (TYLENOL) 500 MG tablet Take 1,000 mg by mouth every 6 (six) hours as needed for moderate pain (pain).    [name] aspirin 81 MG tablet Take 81 mg by mouth every morning.     . bifidobacterium infantis (ALIGN) capsule Take 1 capsule by mouth daily as needed (for regularity).     . carvedilol (COREG) 3.125 MG tablet Take 1 tablet in the am and 2 in the pm. 90 tablet 2  . diazepam (VALIUM) 5 MG tablet Take 5 mg by mouth every 8 (eight) hours as needed for anxiety. Disp. 30 tablets. 3 refills.  Called in to [facility] per Dr. Sondra Come.    . docusate sodium (COLACE) 50 MG capsule Take 50 mg by mouth daily as needed for mild constipation.     . furosemide (LASIX) 20 MG tablet Take 1 tablet (20 mg total) by  mouth daily as needed for fluid. 30 tablet 0  . ibuprofen (ADVIL,MOTRIN) 800 MG tablet Take 1 tablet (800 mg total) by mouth every 8 (eight) hours as needed. (Patient taking differently: Take 800 mg by mouth every 8 (eight) hours as needed for moderate pain. ) 60 tablet 0  . lactase (LACTAID) 3000 UNITS tablet Take 1 tablet by mouth 3 (three) times daily as needed (for dairy intake).     . magnesium chloride (SLOW-MAG) 64 MG TBEC SR tablet Take 1-2 tablets by mouth 2 (two) times daily. Takes two in the morning and one in the evening    . omeprazole (PRILOSEC) 20 MG capsule Take 1 capsule (20 mg total) by mouth 2 (two) times daily. 60 capsule 1  . OVER THE COUNTER MEDICATION Allergy Plus Sinus Headache- acetaminophen 325, diphenhydramine 12.5, phenylephrine 5 mg    . potassium chloride (K-DUR) 10 MEQ tablet Take 2 tablets (20 mEq total) by mouth 2 (two) times daily. 120 tablet 1  . sertraline (ZOLOFT) 100 MG tablet Take 1 tablet (100 mg total) by mouth daily. take 1 tablet by mouth once daily (Patient taking differently: Take 100 mg by mouth every evening. ) 30 tablet 1  . cyclobenzaprine (FLEXERIL) 10 MG tablet Take 1  tablet (10 mg total) by mouth 3 (three) times daily as needed for muscle spasms. (Patient not taking: Reported on 02/17/2015) 30 tablet 0  . HYDROcodone-acetaminophen (NORCO/VICODIN) 5-325 MG per tablet Take 2 tablets by mouth every 4 (four) hours as needed. (Patient not taking: Reported on 06/03/2015) 10 tablet 0  . loperamide (IMODIUM) 2 MG capsule Take 2 mg by mouth 4 (four) times daily as needed for diarrhea or loose stools (diarrhea).     . mometasone (NASONEX) 50 MCG/ACT nasal spray Place 2 sprays into the nose daily. One spray in each nostril. (Patient not taking: Reported on 06/07/2015) 17 g 0  . ondansetron (ZOFRAN ODT) 4 MG disintegrating tablet Take 1 tablet (4 mg total) by mouth every 8 (eight) hours as needed for nausea. (Patient not taking: Reported on 06/03/2015) 6 tablet 0  .  oxyCODONE-acetaminophen (PERCOCET) 5-325 MG per tablet Take 1 tablet by mouth every 6 (six) hours as needed for moderate pain. (Patient not taking: Reported on 06/03/2015) 20 tablet 0  . polyethylene glycol powder (GLYCOLAX/MIRALAX) powder Take 17 g by mouth daily as needed. (Patient not taking: Reported on 06/07/2015) 3350 g 1   No current facility-administered medications for this encounter.    Physical Findings: The patient is in no acute distress. Patient is alert and oriented.  height is 5\' 6"  (1.676 m) and weight is 325 lb 4.8 oz (147.555 kg). Her oral temperature is 98.2 F (36.8 C). Her blood pressure is 112/72 and her pulse is 87. Her respiration is 20 and oxygen saturation is 99%. .  No palpable subclavicular or axillary adenopathy. The lungs are clear to auscultation. The heart has a regular rhythm and rate. The abdomen is soft and nontender with normal bowel sounds. No inguinal adenopathy appreciated. On pelvic examination the external genitalia are unremarkable. A speculum exam is performed. No mucosal lesions noted in the vaginal vault. A Pap smear was obtained of the proximal vagina. On bimanual examination there no obvious pelvic masses although exam is compromised in light of the patient's body habitus.  Lab Findings: Lab Results  Component Value Date   WBC 4.9 03/26/2015   HGB 13.5 03/26/2015   HCT 42.1 03/26/2015   MCV 91.5 03/26/2015   PLT 248 03/26/2015    Radiographic Findings: No results found.  Impression:  no evidence of recurrence on clinical exam today, Pap smear pending  Plan:   Routine follow-up in 6 months. In the interim the patient will be seen by Dr. Skeet Latch.  ____________________________________ Gery Pray, MD

## 2015-06-07 NOTE — Progress Notes (Signed)
Follow up endometrial cancer  Rad txs 04/07/13-05/15/15 no bleeding or discharge c/o shoulder pain, generalized back pain,knee, saw Dr. Skeet Latch 02/24/15, she is scheduled for cholecystectomy 06/09/15, 8:46 AM BP 112/72 mmHg  Pulse 87  Temp(Src) 98.2 F (36.8 C) (Oral)  Resp 20  Ht 5\' 6"  (1.676 m)  Wt 325 lb 4.8 oz (147.555 kg)  BMI 52.53 kg/m2  SpO2 99%  LMP 09/20/2012  Wt Readings from Last 3 Encounters:  06/07/15 325 lb 4.8 oz (147.555 kg)  05/05/15 326 lb (147.873 kg)  04/19/15 318 lb (144.244 kg)

## 2015-06-08 DIAGNOSIS — Z6841 Body Mass Index (BMI) 40.0 and over, adult: Secondary | ICD-10-CM | POA: Diagnosis not present

## 2015-06-08 DIAGNOSIS — K649 Unspecified hemorrhoids: Secondary | ICD-10-CM | POA: Diagnosis not present

## 2015-06-08 DIAGNOSIS — Z8542 Personal history of malignant neoplasm of other parts of uterus: Secondary | ICD-10-CM | POA: Diagnosis not present

## 2015-06-08 DIAGNOSIS — E780 Pure hypercholesterolemia: Secondary | ICD-10-CM | POA: Diagnosis not present

## 2015-06-08 DIAGNOSIS — I10 Essential (primary) hypertension: Secondary | ICD-10-CM | POA: Diagnosis not present

## 2015-06-08 DIAGNOSIS — Z90710 Acquired absence of both cervix and uterus: Secondary | ICD-10-CM | POA: Diagnosis not present

## 2015-06-08 DIAGNOSIS — M1990 Unspecified osteoarthritis, unspecified site: Secondary | ICD-10-CM | POA: Diagnosis not present

## 2015-06-08 DIAGNOSIS — K8010 Calculus of gallbladder with chronic cholecystitis without obstruction: Secondary | ICD-10-CM | POA: Diagnosis not present

## 2015-06-08 DIAGNOSIS — G43909 Migraine, unspecified, not intractable, without status migrainosus: Secondary | ICD-10-CM | POA: Diagnosis not present

## 2015-06-08 DIAGNOSIS — K8020 Calculus of gallbladder without cholecystitis without obstruction: Secondary | ICD-10-CM | POA: Diagnosis present

## 2015-06-08 DIAGNOSIS — F419 Anxiety disorder, unspecified: Secondary | ICD-10-CM | POA: Diagnosis not present

## 2015-06-08 DIAGNOSIS — M549 Dorsalgia, unspecified: Secondary | ICD-10-CM | POA: Diagnosis not present

## 2015-06-08 DIAGNOSIS — F329 Major depressive disorder, single episode, unspecified: Secondary | ICD-10-CM | POA: Diagnosis not present

## 2015-06-08 DIAGNOSIS — Z79899 Other long term (current) drug therapy: Secondary | ICD-10-CM | POA: Diagnosis not present

## 2015-06-08 DIAGNOSIS — Z923 Personal history of irradiation: Secondary | ICD-10-CM | POA: Diagnosis not present

## 2015-06-08 DIAGNOSIS — K219 Gastro-esophageal reflux disease without esophagitis: Secondary | ICD-10-CM | POA: Diagnosis not present

## 2015-06-08 DIAGNOSIS — Z7982 Long term (current) use of aspirin: Secondary | ICD-10-CM | POA: Diagnosis not present

## 2015-06-08 LAB — CYTOLOGY - PAP

## 2015-06-09 ENCOUNTER — Ambulatory Visit: Payer: Medicaid Other | Admitting: Radiation Oncology

## 2015-06-09 ENCOUNTER — Telehealth: Payer: Self-pay | Admitting: Oncology

## 2015-06-09 NOTE — Telephone Encounter (Signed)
Called Deborah [name] and advised her that her pap smear was good per Dr. Sondra Come.  [name] verbalized understanding and would like a copy of the results.  She will stop by to pick them up.

## 2015-06-15 ENCOUNTER — Telehealth: Payer: Self-pay | Admitting: Oncology

## 2015-06-15 NOTE — Telephone Encounter (Signed)
Deborah [name] called and requested a refill on diazepam 5 mg tablets.

## 2015-06-15 NOTE — Telephone Encounter (Signed)
Called in refill for diazepam (VALIUM) 5 MG tablet - Take 5 mg by mouth every 8 (eight) hours as needed for anxiety. Disp. 30 tablets. 3 refills. Called in to [facility] per Dr. Sondra Come. Called Deborah [name] and left a message that it has been called in.

## 2015-06-16 ENCOUNTER — Encounter (HOSPITAL_COMMUNITY): Payer: Self-pay

## 2015-06-16 ENCOUNTER — Emergency Department (HOSPITAL_COMMUNITY)
Admission: EM | Admit: 2015-06-16 | Discharge: 2015-06-16 | Disposition: A | Discharge status: Home / Self Care | Payer: Medicaid Other | Attending: Emergency Medicine | Admitting: Emergency Medicine

## 2015-06-16 ENCOUNTER — Emergency Department (HOSPITAL_COMMUNITY): Payer: Medicaid Other

## 2015-06-16 ENCOUNTER — Other Ambulatory Visit: Payer: Self-pay

## 2015-06-16 DIAGNOSIS — F419 Anxiety disorder, unspecified: Secondary | ICD-10-CM | POA: Diagnosis not present

## 2015-06-16 DIAGNOSIS — E669 Obesity, unspecified: Secondary | ICD-10-CM | POA: Insufficient documentation

## 2015-06-16 DIAGNOSIS — F329 Major depressive disorder, single episode, unspecified: Secondary | ICD-10-CM | POA: Diagnosis not present

## 2015-06-16 DIAGNOSIS — R1013 Epigastric pain: Secondary | ICD-10-CM | POA: Diagnosis not present

## 2015-06-16 DIAGNOSIS — R079 Chest pain, unspecified: Secondary | ICD-10-CM

## 2015-06-16 DIAGNOSIS — M1990 Unspecified osteoarthritis, unspecified site: Secondary | ICD-10-CM | POA: Insufficient documentation

## 2015-06-16 DIAGNOSIS — Z79899 Other long term (current) drug therapy: Secondary | ICD-10-CM | POA: Insufficient documentation

## 2015-06-16 DIAGNOSIS — Q898 Other specified congenital malformations: Secondary | ICD-10-CM | POA: Insufficient documentation

## 2015-06-16 DIAGNOSIS — Z7982 Long term (current) use of aspirin: Secondary | ICD-10-CM | POA: Diagnosis not present

## 2015-06-16 DIAGNOSIS — Z862 Personal history of diseases of the blood and blood-forming organs and certain disorders involving the immune mechanism: Secondary | ICD-10-CM | POA: Diagnosis not present

## 2015-06-16 DIAGNOSIS — R109 Unspecified abdominal pain: Secondary | ICD-10-CM

## 2015-06-16 DIAGNOSIS — Z8544 Personal history of malignant neoplasm of other female genital organs: Secondary | ICD-10-CM | POA: Insufficient documentation

## 2015-06-16 DIAGNOSIS — R1011 Right upper quadrant pain: Secondary | ICD-10-CM | POA: Insufficient documentation

## 2015-06-16 LAB — BASIC METABOLIC PANEL
ANION GAP: 8 (ref 5–15)
BUN: 14 mg/dL (ref 6–20)
CALCIUM: 8.5 mg/dL — AB (ref 8.9–10.3)
CHLORIDE: 106 mmol/L (ref 101–111)
CO2: 23 mmol/L (ref 22–32)
CREATININE: 0.85 mg/dL (ref 0.44–1.00)
GFR calc Af Amer: >60 mL/min (ref >60)
Glucose, Bld: 110 mg/dL — ABNORMAL HIGH (ref 65–99)
Potassium: 3.4 mmol/L — ABNORMAL LOW (ref 3.5–5.1)
Sodium: 137 mmol/L (ref 135–145)

## 2015-06-16 LAB — HEPATIC FUNCTION PANEL
ALBUMIN: 3.4 g/dL — AB (ref 3.5–5.0)
ALK PHOS: 61 U/L (ref 38–126)
ALT: 17 U/L (ref 14–54)
AST: 18 U/L (ref 15–41)
BILIRUBIN TOTAL: 0.5 mg/dL (ref 0.3–1.2)
Bilirubin, Direct: <0.1 mg/dL — ABNORMAL LOW (ref 0.1–0.5)
Total Protein: 7.6 g/dL (ref 6.5–8.1)

## 2015-06-16 LAB — CBC
HCT: 39.3 % (ref 36.0–46.0)
HEMOGLOBIN: 13 g/dL (ref 12.0–15.0)
MCH: 29.6 pg (ref 26.0–34.0)
MCHC: 33.1 g/dL (ref 30.0–36.0)
MCV: 89.5 fL (ref 78.0–100.0)
Platelets: 258 10*3/uL (ref 150–400)
RBC: 4.39 MIL/uL (ref 3.87–5.11)
RDW: 13.7 % (ref 11.5–15.5)
WBC: 5.5 10*3/uL (ref 4.0–10.5)

## 2015-06-16 LAB — I-STAT TROPONIN, ED: TROPONIN I, POC: 0 ng/mL (ref 0.00–0.08)

## 2015-06-16 LAB — LIPASE, BLOOD: Lipase: 16 U/L — ABNORMAL LOW (ref 22–51)

## 2015-06-16 MED ORDER — KETOROLAC TROMETHAMINE 30 MG/ML IJ SOLN
30.0000 mg | Freq: Once | INTRAMUSCULAR | Status: AC
  Administered 2015-06-16: 30 mg via INTRAVENOUS
  Filled 2015-06-16: qty 1

## 2015-06-16 MED ORDER — ONDANSETRON 4 MG PO TBDP: 4.0000 mg | ORAL_TABLET | Freq: Three times a day (TID) | ORAL | Status: DC | PRN

## 2015-06-16 MED ORDER — OXYCODONE-ACETAMINOPHEN 5-325 MG PO TABS: 1.0000 | ORAL_TABLET | ORAL | Status: DC | PRN

## 2015-06-16 MED ORDER — GI COCKTAIL ~~LOC~~
30.0000 mL | Freq: Once | ORAL | Status: AC
  Administered 2015-06-16: 30 mL via ORAL
  Filled 2015-06-16: qty 30

## 2015-06-16 MED ORDER — MORPHINE SULFATE 4 MG/ML IJ SOLN: 4.0000 mg | Freq: Once | INTRAMUSCULAR | Status: DC

## 2015-06-16 MED ORDER — ONDANSETRON HCL 4 MG/2ML IJ SOLN
4.0000 mg | Freq: Once | INTRAMUSCULAR | Status: AC
  Administered 2015-06-16: 4 mg via INTRAVENOUS
  Filled 2015-06-16: qty 2

## 2015-06-16 NOTE — ED Notes (Signed)
Pt presents with c/o chest pain and abdominal pain. Pt reports that she has a problem with gallstones and this pain is similar to the pain she has had with this before. Pt reports the chest pain is in the left of her chest above her right breast. NAD at this time, talking in complete sentences.

## 2015-06-16 NOTE — Discharge Instructions (Signed)
Take the prescribed medication as directed. Follow-up with [facility] [place] [facility]-- make sure to call and reschedule your cholecystectomy. Return to the ED for new or worsening symptoms.

## 2015-06-16 NOTE — ED Notes (Signed)
Nurse starting IV 

## 2015-06-16 NOTE — ED Provider Notes (Signed)
CSN: [id number]     Arrival date & time 06/16/15  1205 History   First MD Initiated Contact with Patient 06/16/15 1209     Chief Complaint  Patient presents with  . Chest Pain  . Abdominal Pain     (Consider location/radiation/quality/duration/timing/severity/associated sxs/prior Treatment) Patient is a 49 y.o. female presenting with chest pain and abdominal pain. The history is provided by the patient and medical records.  Chest Pain Associated symptoms: abdominal pain   Abdominal Pain Associated symptoms: chest pain     49 year old female with history of anemia, anxiety, depression, arthritis, gallstones, presenting to the ED for chest and abdominal pain. Initially patient cannot express were her immediate concerns are, repeatedly stating she needs pain medications and is required redirection multiple times. Patient states she has right-sided chest and abdominal pain which she feels is consistent with her prior "gallstone attacks". She does report some nausea earlier but denies vomiting. She states when she ate fried chicken yesterday she thinks this offset her symptoms.  She was able to tolerate yogurt this morning without difficulty.  She denies any fever, chills, or diarrhea. Patient was scheduled for outpatient cholecystectomy on 06/09/2015, however she forgot to stop taking her aspirin therefore surgery was canceled. She states she is currently waiting for rescheduled date.  Patient has known grade 2 systolic dysfunction, no history of coronary artery disease. Patient has never been a smoker. Vital signs stable on arrival.  Past Medical History  Diagnosis Date  . Congenital birth defect     Right hand  . Anemia   . Anxiety   . Swelling     ANKLES - TAKES LASIX  . Depression   . HLD (hyperlipidemia)     borderline  . Obesity   . Hx of radiation therapy 04/07/13- 05/14/13    pelvis 45 gray 25 fx  . Hx of echocardiogram     Echo (9/15): EF 50-55%, normal wall motion, grade 2  diastolic dysfunction, mild LAE  . Arthritis     ,Knees, back  . Endometrial cancer    Past Surgical History  Procedure Laterality Date  . Hiatal hernia repair    . Repair vaginal cuff  10/07/2012    Procedure: REPAIR VAGINAL CUFF;  Surgeon: [name], MD PHD;  Location: [facility];  Service: Gynecology;;  . Robotic assisted total hysterectomy with bilateral salpingo oopherectomy  10/07/2012    Procedure: ROBOTIC ASSISTED TOTAL HYSTERECTOMY WITH BILATERAL SALPINGO OOPHORECTOMY;  Surgeon: [name], MD PHD;  Location: [facility];  Service: Gynecology;  Laterality: Bilateral;  . Abdominal hysterectomy  10/2011    complete   Family History  Problem Relation Age of Onset  . Pancreatic cancer Maternal Grandfather     or liver cancer  . Hypertension Maternal Grandfather   . Cancer Maternal Grandfather     pancreatic  . Hypertension Maternal Grandmother   . Diabetes Mother   . Colon polyps Maternal Aunt   . Cancer Maternal Aunt     lung  . Colon cancer Cousin   . Cancer Cousin     lung  . Lung cancer Maternal Aunt   . Heart attack Neg Hx   . Stroke Cousin    History  Substance Use Topics  . Smoking status: Never Smoker   . Smokeless tobacco: Never Used  . Alcohol Use: Yes     Comment: rare   OB History    Gravida Para Term Preterm AB TAB SAB Ectopic Multiple Living   2 1  0 1 1 0 1 0 0 1     Review of Systems  Cardiovascular: Positive for chest pain.  Gastrointestinal: Positive for abdominal pain.  All other systems reviewed and are negative.     Allergies  Lactose intolerance (gi)  Home Medications   Prior to Admission medications   Medication Sig Start Date End Date Taking? Authorizing Provider  acetaminophen (TYLENOL) 500 MG tablet Take 1,000 mg by mouth every 6 (six) hours as needed for moderate pain (pain).   Yes Historical Provider, MD  aspirin 81 MG tablet Take 81 mg by mouth every morning.    Yes Historical Provider, MD  bifidobacterium infantis  (ALIGN) capsule Take 1 capsule by mouth daily as needed (for regularity).  10/03/12  Yes Jerene Bears, MD  carvedilol (COREG) 3.125 MG tablet Take 1 tablet in the am and 2 in the pm. 04/27/15  Yes Minus Breeding, MD  diazepam (VALIUM) 5 MG tablet Take 5 mg by mouth every 8 (eight) hours as needed for anxiety. Disp. 30 tablets. 3 refills.  Called in to [facility] per Dr. Sondra Come. 03/24/15  Yes Gery Pray, MD  furosemide (LASIX) 20 MG tablet Take 1 tablet (20 mg total) by mouth daily as needed for fluid. 03/16/15  Yes Leeanne Rio, MD  ibuprofen (ADVIL,MOTRIN) 800 MG tablet Take 1 tablet (800 mg total) by mouth every 8 (eight) hours as needed. Patient taking differently: Take 800 mg by mouth every 8 (eight) hours as needed for moderate pain.  05/05/15  Yes Leone Haven, MD  lactase (LACTAID) 3000 UNITS tablet Take 1 tablet by mouth 3 (three) times daily as needed (for dairy intake).  10/03/12  Yes Jerene Bears, MD  loperamide (IMODIUM) 2 MG capsule Take 2 mg by mouth 4 (four) times daily as needed for diarrhea or loose stools (diarrhea).    Yes Historical Provider, MD  magnesium chloride (SLOW-MAG) 64 MG TBEC SR tablet Take 1-2 tablets by mouth 2 (two) times daily. Takes two in the morning and one in the evening 12/02/13  Yes Leone Haven, MD  mometasone (NASONEX) 50 MCG/ACT nasal spray Place 2 sprays into the nose daily. One spray in each nostril. 11/09/13  Yes Leone Haven, MD  omeprazole (PRILOSEC) 20 MG capsule Take 1 capsule (20 mg total) by mouth 2 (two) times daily. 05/05/15  Yes Leone Haven, MD  potassium chloride (K-DUR) 10 MEQ tablet Take 2 tablets (20 mEq total) by mouth 2 (two) times daily. 06/06/15  Yes Aquilla Hacker, MD  sertraline (ZOLOFT) 100 MG tablet Take 1 tablet (100 mg total) by mouth daily. take 1 tablet by mouth once daily Patient taking differently: Take 100 mg by mouth every evening.  04/04/15  Yes Leone Haven, MD  HYDROcodone-acetaminophen (NORCO/VICODIN)  5-325 MG per tablet Take 2 tablets by mouth every 4 (four) hours as needed. Patient not taking: Reported on 06/03/2015 03/01/15   Tanna Furry, MD  ondansetron (ZOFRAN ODT) 4 MG disintegrating tablet Take 1 tablet (4 mg total) by mouth every 8 (eight) hours as needed for nausea. Patient not taking: Reported on 06/03/2015 03/01/15   Tanna Furry, MD  oxyCODONE-acetaminophen (PERCOCET) 5-325 MG per tablet Take 1 tablet by mouth every 6 (six) hours as needed for moderate pain. Patient not taking: Reported on 06/03/2015 02/05/15   Evelina Bucy, MD  polyethylene glycol powder (GLYCOLAX/MIRALAX) powder Take 17 g by mouth daily as needed. Patient not taking: Reported on 06/07/2015 11/10/13  Leone Haven, MD   BP 116/72 mmHg  Pulse 90  Temp(Src) 98.7 F (37.1 C) (Oral)  Resp 18  SpO2 97%  LMP 09/20/2012   Physical Exam  Constitutional: She is oriented to person, place, and time. She appears well-developed and well-nourished. No distress.  HENT:  Head: Normocephalic and atraumatic.  Mouth/Throat: Oropharynx is clear and moist.  Eyes: Conjunctivae and EOM are normal. Pupils are equal, round, and reactive to light.  Neck: Normal range of motion. Neck supple.  Cardiovascular: Normal rate, regular rhythm and normal heart sounds.   Pulmonary/Chest: Effort normal and breath sounds normal. No respiratory distress. She has no wheezes.  Abdominal: Soft. Bowel sounds are normal. There is no tenderness. There is no guarding, no tenderness at McBurney's point and negative Murphy's sign.  Endorses pain of epigastrium and right upper quadrant, however no focal tenderness; negative Murphy's sign; normal bowel sounds  Musculoskeletal: Normal range of motion.  Neurological: She is alert and oriented to person, place, and time.  Skin: Skin is warm and dry. She is not diaphoretic.  Psychiatric: She has a normal mood and affect.  Nursing note and vitals reviewed.   ED Course  Procedures (including critical care  time) Labs Review Labs Reviewed  BASIC METABOLIC PANEL - Abnormal; Notable for the following:    Potassium 3.4 (*)    Glucose, Bld 110 (*)    Calcium 8.5 (*)    All other components within normal limits  HEPATIC FUNCTION PANEL - Abnormal; Notable for the following:    Albumin 3.4 (*)    Bilirubin, Direct <0.1 (*)    All other components within normal limits  LIPASE, BLOOD - Abnormal; Notable for the following:    Lipase 16 (*)    All other components within normal limits  CBC  I-STAT TROPOININ, ED    Imaging Review No results found.   EKG Interpretation None      MDM   Final diagnoses:  Abdominal pain, unspecified abdominal location  Chest pain, unspecified chest pain type   49 year old female here with right upper abdominal and chest pain. History of gallstones and states this feels similar to one of her "gallbladder attacks".  She does admit to eating fried chicken yesterday. Patient is afebrile, nontoxic. She endorses pain in her right upper quadrant and chest, however there is no focal tenderness or peritoneal signs on exam. EKG sinus rhythm without acute ischemic changes.  Labwork is reassuring, troponin negative. LFTs, alk phos, and bili all remain WNL.  CXR is clear.  Patient was treated with toradol, GI cocktail, and zofran with resolution of symptoms.  States she feels great presently.  Repeat abdominal exam remains benign.  Patient does have known gallstones, however I doubt acute cholecystitis at this time. Also doubt ACS, PE, dissection, or other acute cardiac event.  Will discharge home with pain and nausea medicine. Patient is encouraged to follow-up with general surgery to reschedule her cholecystectomy.  Discussed plan with patient, he/she acknowledged understanding and agreed with plan of care.  Return precautions given for new or worsening symptoms.  Larene Pickett, PA-C 06/16/15 [place], MD 06/17/15 [phone]

## 2015-06-16 NOTE — ED Notes (Signed)
Initial Contact - pt A+OX4, rambling, difficult to redirect to topic.  C/o R sided CP, RUQ pain, since this AM.  8/10.  Pt reports known gallstones "need to get it taken out".  Pt denies n/v/d/c, denies fevers/chills, denies SOB.  Speaking full/clear sentences with ease, rr even/un-lab.  Skin PWD.  MAEI, self repositioning for comfort.  Changed to hospital gown, placed to cardiac/02 monitor.  NAD.

## 2015-06-17 ENCOUNTER — Encounter (HOSPITAL_COMMUNITY): Payer: Self-pay

## 2015-06-17 ENCOUNTER — Emergency Department (HOSPITAL_COMMUNITY): Payer: Medicaid Other

## 2015-06-17 ENCOUNTER — Emergency Department (HOSPITAL_COMMUNITY)
Admission: EM | Admit: 2015-06-17 | Discharge: 2015-06-17 | Disposition: A | Discharge status: Home / Self Care | Payer: Medicaid Other | Attending: Emergency Medicine | Admitting: Emergency Medicine

## 2015-06-17 DIAGNOSIS — F329 Major depressive disorder, single episode, unspecified: Secondary | ICD-10-CM | POA: Insufficient documentation

## 2015-06-17 DIAGNOSIS — Z79899 Other long term (current) drug therapy: Secondary | ICD-10-CM | POA: Insufficient documentation

## 2015-06-17 DIAGNOSIS — F419 Anxiety disorder, unspecified: Secondary | ICD-10-CM | POA: Insufficient documentation

## 2015-06-17 DIAGNOSIS — R1011 Right upper quadrant pain: Secondary | ICD-10-CM

## 2015-06-17 DIAGNOSIS — M1990 Unspecified osteoarthritis, unspecified site: Secondary | ICD-10-CM | POA: Insufficient documentation

## 2015-06-17 DIAGNOSIS — Q899 Congenital malformation, unspecified: Secondary | ICD-10-CM | POA: Diagnosis not present

## 2015-06-17 DIAGNOSIS — K8020 Calculus of gallbladder without cholecystitis without obstruction: Secondary | ICD-10-CM | POA: Insufficient documentation

## 2015-06-17 DIAGNOSIS — Z8542 Personal history of malignant neoplasm of other parts of uterus: Secondary | ICD-10-CM | POA: Insufficient documentation

## 2015-06-17 DIAGNOSIS — E669 Obesity, unspecified: Secondary | ICD-10-CM | POA: Diagnosis not present

## 2015-06-17 DIAGNOSIS — Z862 Personal history of diseases of the blood and blood-forming organs and certain disorders involving the immune mechanism: Secondary | ICD-10-CM | POA: Insufficient documentation

## 2015-06-17 DIAGNOSIS — Z7982 Long term (current) use of aspirin: Secondary | ICD-10-CM | POA: Insufficient documentation

## 2015-06-17 DIAGNOSIS — R079 Chest pain, unspecified: Secondary | ICD-10-CM | POA: Diagnosis present

## 2015-06-17 LAB — CBC WITH DIFFERENTIAL/PLATELET
BASOS PCT: 0 % (ref 0–1)
Basophils Absolute: 0 10*3/uL (ref 0.0–0.1)
EOS ABS: 0.2 10*3/uL (ref 0.0–0.7)
Eosinophils Relative: 3 % (ref 0–5)
HEMATOCRIT: 35.3 % — AB (ref 36.0–46.0)
HEMOGLOBIN: 11.5 g/dL — AB (ref 12.0–15.0)
LYMPHS PCT: 21 % (ref 12–46)
Lymphs Abs: 1.3 10*3/uL (ref 0.7–4.0)
MCH: 29.8 pg (ref 26.0–34.0)
MCHC: 32.6 g/dL (ref 30.0–36.0)
MCV: 91.5 fL (ref 78.0–100.0)
MONO ABS: 0.5 10*3/uL (ref 0.1–1.0)
Monocytes Relative: 8 % (ref 3–12)
Neutro Abs: 4.2 10*3/uL (ref 1.7–7.7)
Neutrophils Relative %: 68 % (ref 43–77)
PLATELETS: 248 10*3/uL (ref 150–400)
RBC: 3.86 MIL/uL — ABNORMAL LOW (ref 3.87–5.11)
RDW: 14 % (ref 11.5–15.5)
WBC: 6.1 10*3/uL (ref 4.0–10.5)

## 2015-06-17 LAB — COMPREHENSIVE METABOLIC PANEL
ALBUMIN: 3.2 g/dL — AB (ref 3.5–5.0)
ALK PHOS: 59 U/L (ref 38–126)
ALT: 16 U/L (ref 14–54)
ANION GAP: 7 (ref 5–15)
AST: 15 U/L (ref 15–41)
BILIRUBIN TOTAL: 0.4 mg/dL (ref 0.3–1.2)
BUN: 13 mg/dL (ref 6–20)
CHLORIDE: 106 mmol/L (ref 101–111)
CO2: 27 mmol/L (ref 22–32)
Calcium: 8.3 mg/dL — ABNORMAL LOW (ref 8.9–10.3)
Creatinine, Ser: 0.88 mg/dL (ref 0.44–1.00)
GFR calc Af Amer: >60 mL/min (ref >60)
GFR calc non Af Amer: >60 mL/min (ref >60)
Glucose, Bld: 127 mg/dL — ABNORMAL HIGH (ref 65–99)
Potassium: 3.6 mmol/L (ref 3.5–5.1)
Sodium: 140 mmol/L (ref 135–145)
Total Protein: 7.2 g/dL (ref 6.5–8.1)

## 2015-06-17 LAB — LIPASE, BLOOD: Lipase: 14 U/L — ABNORMAL LOW (ref 22–51)

## 2015-06-17 LAB — I-STAT TROPONIN, ED: Troponin i, poc: 0 ng/mL (ref 0.00–0.08)

## 2015-06-17 MED ORDER — MORPHINE SULFATE 4 MG/ML IJ SOLN
4.0000 mg | Freq: Once | INTRAMUSCULAR | Status: AC
  Administered 2015-06-17: 4 mg via INTRAVENOUS
  Filled 2015-06-17: qty 1

## 2015-06-17 MED ORDER — SODIUM CHLORIDE 0.9 % IV BOLUS (SEPSIS): 1000.0000 mL | Freq: Once | INTRAVENOUS | Status: DC

## 2015-06-17 MED ORDER — ONDANSETRON HCL 4 MG/2ML IJ SOLN
4.0000 mg | Freq: Once | INTRAMUSCULAR | Status: AC
  Administered 2015-06-17: 4 mg via INTRAVENOUS
  Filled 2015-06-17: qty 2

## 2015-06-17 MED ORDER — GI COCKTAIL ~~LOC~~
30.0000 mL | Freq: Once | ORAL | Status: AC
  Administered 2015-06-17: 30 mL via ORAL
  Filled 2015-06-17: qty 30

## 2015-06-17 MED ORDER — SODIUM CHLORIDE 0.9 % IV BOLUS (SEPSIS)
1000.0000 mL | Freq: Once | INTRAVENOUS | Status: AC
  Administered 2015-06-17: 1000 mL via INTRAVENOUS

## 2015-06-17 NOTE — ED Notes (Signed)
Gave pt sprite and water

## 2015-06-17 NOTE — ED Notes (Signed)
Unable to get blood return on IV. IV flushes easily, no swelling or tenderness noted after IV bolus started.

## 2015-06-17 NOTE — Discharge Instructions (Signed)
Cholelithiasis Follow-up with your gastroenterologist, Dr. Hulen Skains. Have your Percocet and Zofran filled today. Cholelithiasis (also called gallstones) is a form of gallbladder disease. The gallbladder is a small organ that helps you digest fats. Symptoms of gallstones are:  Feeling sick to your stomach (nausea).  Throwing up (vomiting).  Belly pain.  Yellowing of the skin (jaundice).  Sudden pain. You may feel the pain for minutes to hours.  Fever.  Pain to the touch. HOME CARE  Only take medicines as told by your doctor.  Eat a low-fat diet until you see your doctor again. Eating fat can result in pain.  Follow up with your doctor as told. Attacks usually happen time after time. Surgery is usually needed for permanent treatment. GET HELP RIGHT AWAY IF:   Your pain gets worse.  Your pain is not helped by medicines.  You have a fever and lasting symptoms for more than 2-3 days.  You have a fever and your symptoms suddenly get worse.  You keep feeling sick to your stomach and throwing up. MAKE SURE YOU:   Understand these instructions.  Will watch your condition.  Will get help right away if you are not doing well or get worse. Document Released: 04/30/2008 Document Revised: 07/15/2013 Document Reviewed: 05/06/2013 [facility] Patient Information 2015 [place], [place]. This information is not intended to replace advice given to you by your health care provider. Make sure you discuss any questions you have with your health care provider.

## 2015-06-17 NOTE — ED Notes (Signed)
Bed: WA08 Expected date:  Expected time:  Means of arrival:  Comments: 

## 2015-06-17 NOTE — ED Notes (Signed)
Pt threw up after GI coctail

## 2015-06-17 NOTE — ED Provider Notes (Signed)
CSN: [id number]     Arrival date & time 06/17/15  0505 History   First MD Initiated Contact with Patient 06/17/15 0600     Chief Complaint  Patient presents with  . Chest Pain     (Consider location/radiation/quality/duration/timing/severity/associated sxs/prior Treatment) Patient is a 49 y.o. female presenting with chest pain. The history is provided by the patient. No language interpreter was used.  Chest Pain Associated symptoms: abdominal pain and nausea   Associated symptoms: no fever and not vomiting   [name] is a 49 y.o female with a history of anemia, anxiety, depression, arthritis, gallstones and hyperlipidemia who presents for right upper quadrant and epigastric abdominal pain for the last 2 days. She states that she feels that this is similar to her "gallbladder attacks," that she gets every few months. She reports that she was seen yesterday for the same and was given Percocet and Zofran but did not fill her prescription. She says she had some left over Vicodin and took 2 without any relief. She has had nausea but no vomiting since leaving the hospital yesterday. She ate a salad last night and says she does not eat greasy food anymore. She was scheduled for outpatient cholecystectomy on 06/09/2015 but surgery was canceled due to patient noncompliance with aspirin. She is planning on having surgery in August. She is requesting pain medicine. She denies any fever, chills, dysuria, diarrhea.  Past Medical History  Diagnosis Date  . Congenital birth defect     Right hand  . Anemia   . Anxiety   . Swelling     ANKLES - TAKES LASIX  . Depression   . HLD (hyperlipidemia)     borderline  . Obesity   . Hx of radiation therapy 04/07/13- 05/14/13    pelvis 45 gray 25 fx  . Hx of echocardiogram     Echo (9/15): EF 50-55%, normal wall motion, grade 2 diastolic dysfunction, mild LAE  . Arthritis     ,Knees, back  . Endometrial cancer    Past Surgical History  Procedure  Laterality Date  . Hiatal hernia repair    . Repair vaginal cuff  10/07/2012    Procedure: REPAIR VAGINAL CUFF;  Surgeon: [name], MD PHD;  Location: [facility];  Service: Gynecology;;  . Robotic assisted total hysterectomy with bilateral salpingo oopherectomy  10/07/2012    Procedure: ROBOTIC ASSISTED TOTAL HYSTERECTOMY WITH BILATERAL SALPINGO OOPHORECTOMY;  Surgeon: [name], MD PHD;  Location: [facility];  Service: Gynecology;  Laterality: Bilateral;  . Abdominal hysterectomy  10/2011    complete   Family History  Problem Relation Age of Onset  . Pancreatic cancer Maternal Grandfather     or liver cancer  . Hypertension Maternal Grandfather   . Cancer Maternal Grandfather     pancreatic  . Hypertension Maternal Grandmother   . Diabetes Mother   . Colon polyps Maternal Aunt   . Cancer Maternal Aunt     lung  . Colon cancer Cousin   . Cancer Cousin     lung  . Lung cancer Maternal Aunt   . Heart attack Neg Hx   . Stroke Cousin    History  Substance Use Topics  . Smoking status: Never Smoker   . Smokeless tobacco: Never Used  . Alcohol Use: Yes     Comment: rare   OB History    Gravida Para Term Preterm AB TAB SAB Ectopic Multiple Living   2 1 0 1 1 0 1 0  0 1     Review of Systems  Constitutional: Negative for fever and chills.  Cardiovascular: Negative for chest pain.  Gastrointestinal: Positive for nausea and abdominal pain. Negative for vomiting and diarrhea.  All other systems reviewed and are negative.     Allergies  Lactose intolerance (gi)  Home Medications   Prior to Admission medications   Medication Sig Start Date End Date Taking? Authorizing Provider  acetaminophen (TYLENOL) 500 MG tablet Take 1,000 mg by mouth every 6 (six) hours as needed for moderate pain (pain).   Yes Historical Provider, MD  aspirin 81 MG tablet Take 81 mg by mouth every morning.    Yes Historical Provider, MD  bifidobacterium infantis (ALIGN) capsule Take 1 capsule by  mouth daily as needed (for regularity).  10/03/12  Yes Jerene Bears, MD  carvedilol (COREG) 3.125 MG tablet Take 1 tablet in the am and 2 in the pm. 04/27/15  Yes Minus Breeding, MD  diazepam (VALIUM) 5 MG tablet Take 5 mg by mouth every 8 (eight) hours as needed for anxiety. Disp. 30 tablets. 3 refills.  Called in to [facility] per Dr. Sondra Come. 03/24/15  Yes Gery Pray, MD  furosemide (LASIX) 20 MG tablet Take 1 tablet (20 mg total) by mouth daily as needed for fluid. 03/16/15  Yes Leeanne Rio, MD  HYDROcodone-acetaminophen (NORCO/VICODIN) 5-325 MG per tablet Take 2 tablets by mouth every 4 (four) hours as needed. 03/01/15  Yes Tanna Furry, MD  ibuprofen (ADVIL,MOTRIN) 800 MG tablet Take 1 tablet (800 mg total) by mouth every 8 (eight) hours as needed. Patient taking differently: Take 800 mg by mouth every 8 (eight) hours as needed for moderate pain.  05/05/15  Yes Leone Haven, MD  lactase (LACTAID) 3000 UNITS tablet Take 1 tablet by mouth 3 (three) times daily as needed (for dairy intake).  10/03/12  Yes Jerene Bears, MD  loperamide (IMODIUM) 2 MG capsule Take 2 mg by mouth 4 (four) times daily as needed for diarrhea or loose stools (diarrhea).    Yes Historical Provider, MD  magnesium chloride (SLOW-MAG) 64 MG TBEC SR tablet Take 1-2 tablets by mouth 2 (two) times daily. Takes two in the morning and one in the evening 12/02/13  Yes Leone Haven, MD  mometasone (NASONEX) 50 MCG/ACT nasal spray Place 2 sprays into the nose daily. One spray in each nostril. 11/09/13  Yes Leone Haven, MD  omeprazole (PRILOSEC) 20 MG capsule Take 1 capsule (20 mg total) by mouth 2 (two) times daily. 05/05/15  Yes Leone Haven, MD  polyethylene glycol powder (GLYCOLAX/MIRALAX) powder Take 17 g by mouth daily as needed. 11/10/13  Yes Leone Haven, MD  potassium chloride (K-DUR) 10 MEQ tablet Take 2 tablets (20 mEq total) by mouth 2 (two) times daily. 06/06/15  Yes Aquilla Hacker, MD  sertraline (ZOLOFT)  100 MG tablet Take 1 tablet (100 mg total) by mouth daily. take 1 tablet by mouth once daily Patient taking differently: Take 100 mg by mouth every evening.  04/04/15  Yes Leone Haven, MD  ondansetron (ZOFRAN ODT) 4 MG disintegrating tablet Take 1 tablet (4 mg total) by mouth every 8 (eight) hours as needed for nausea. 06/16/15   Larene Pickett, PA-C  oxyCODONE-acetaminophen (PERCOCET/ROXICET) 5-325 MG per tablet Take 1 tablet by mouth every 4 (four) hours as needed. 06/16/15   Larene Pickett, PA-C   BP 134/82 mmHg  Pulse 79  Temp(Src) 98.2 F (36.8  C) (Oral)  Resp 18  Ht 5\' 6"  (1.676 m)  Wt 324 lb (146.965 kg)  BMI 52.32 kg/m2  SpO2 100%  LMP 09/20/2012 Physical Exam  Constitutional: She is oriented to person, place, and time. She appears well-developed and well-nourished.  HENT:  Head: Normocephalic and atraumatic.  Eyes: Conjunctivae are normal.  Neck: Normal range of motion. Neck supple.  Cardiovascular: Normal rate, regular rhythm and normal heart sounds.   Pulmonary/Chest: Effort normal and breath sounds normal. No respiratory distress. She has no wheezes. She has no rales.  Abdominal: Soft. She exhibits no mass. There is tenderness in the right upper quadrant and epigastric area. There is no rigidity, no rebound, no guarding, no tenderness at McBurney's point and negative Murphy's sign.  Obese abdomen.  Musculoskeletal: Normal range of motion.  Neurological: She is alert and oriented to person, place, and time.  Skin: Skin is warm and dry.  Psychiatric: She has a normal mood and affect. Her behavior is normal.  Nursing note and vitals reviewed.   ED Course  Procedures (including critical care time) Labs Review Labs Reviewed  CBC WITH DIFFERENTIAL/PLATELET - Abnormal; Notable for the following:    RBC 3.86 (*)    Hemoglobin 11.5 (*)    HCT 35.3 (*)    All other components within normal limits  COMPREHENSIVE METABOLIC PANEL - Abnormal; Notable for the following:     Glucose, Bld 127 (*)    Calcium 8.3 (*)    Albumin 3.2 (*)    All other components within normal limits  LIPASE, BLOOD - Abnormal; Notable for the following:    Lipase 14 (*)    All other components within normal limits  I-STAT TROPOININ, ED    Imaging Review Dg Chest 2 View  06/16/2015   CLINICAL DATA:  Chest and abdominal pain since this morning. Pending cholecystectomy.  EXAM: CHEST  2 VIEW  COMPARISON:  10/19/2014.  FINDINGS: Normal sized heart. Clear lungs. Mild central peribronchial thickening. Mild scoliosis. Mild thoracic spine degenerative changes.  IMPRESSION: Mild bronchitic changes with mild progression.   Electronically Signed   By: Claudie Revering M.D.   On: 06/16/2015 13:25   US Abdomen Limited Ruq  06/17/2015   CLINICAL DATA:  Acute right upper quadrant abdominal pain.  EXAM: US ABDOMEN LIMITED - RIGHT UPPER QUADRANT  COMPARISON:  Ultrasound of February 05, 2015.  FINDINGS: Gallbladder:  Multiple gallstones are noted with no significant gallbladder wall thickening or pericholecystic fluid. No sonographic Murphy's sign is noted. Largest stone measures 2 cm in the neck.  Common bile duct:  Diameter: 6 mm which is within normal limits.  Liver:  No focal lesion identified. Within normal limits in parenchymal echogenicity.  IMPRESSION: Cholelithiasis is noted without evidence cholecystitis. No other abnormality seen in the right upper quadrant of the abdomen.   Electronically Signed   By: Marijo Conception, M.D.   On: 06/17/2015 07:07     EKG Interpretation   Date/Time:  Friday June 17 2015 05:14:32 EDT Ventricular Rate:  88 PR Interval:  168 QRS Duration: 100 QT Interval:  379 QTC Calculation: 458 R Axis:   29 Text Interpretation:  Sinus rhythm Atrial premature complexes Low voltage,  precordial leads Abnormal R-wave progression, early transition Confirmed  by WARD,  DO, KRISTEN ([id number]) on 06/17/2015 5:35:46 AM      MDM   Final diagnoses:  Calculus of gallbladder without  cholecystitis without obstruction   Patient presents for right upper quadrant and epigastric pain.  She was seen for the same yesterday and had normal LFTs and lipase. She has a history of cholelithiasis and was scheduled to have a gallbladder removal 1 week ago but missed her appointment due to noncompliance with aspirin. She states that it is rescheduled for August. She also has a prescription for Percocet and Zofran which she was given yesterday but has not filled.  She was given morphine here. She also requested a GI cocktail which she was given yesterday and stated that it made her feel better. After GI cocktail patient vomited on the floor. Her repeat labs today are not concerning. I do not believe this is cardiac related. Her troponin is negative and EKG is normal. Ultrasound of the gallbladder shows cholelithiasis but no cholecystitis and common bile duct is within normal limits.this is a chronic problem which she has had for quite some time now. She is tolerating PO fluids.  I discussed filling her prescription and following up with her gastroenterologist, Dr. Hulen Skains. I did not write the patient knew prescriptions. I discussed the plan with the patient she has no unanswered concerns.     Ottie Glazier, PA-C 06/17/15 [place], MD 06/18/15 [phone]

## 2015-06-17 NOTE — ED Notes (Addendum)
Patient complains of chest pain that started yesterday morning.  She repeatedly says, "It ain't ever stopped."  Reports that she was seen for same yesterday, 7/21.  States, "I want them to do some tests on my gallbladder - I know that's what's causing my pain."  Says she had surgery scheduled for 7/14 but was unable to have it - rescheduled for August.

## 2015-06-17 NOTE — ED Notes (Signed)
Went in pts room, introduced self, and instructed pt IV was ordered and to be started, pt started demanding which arm IV was to be put in, as nurse was feeling different locations on arm pt complained about each location, nurse told pt she would get someone else to come in as she was unhappy about each place the RN felt comfortable. Pt did not want further care from the RN.

## 2015-06-17 NOTE — ED Notes (Signed)
US at bedside

## 2015-07-04 ENCOUNTER — Other Ambulatory Visit: Payer: Self-pay | Admitting: Family Medicine

## 2015-07-04 NOTE — Telephone Encounter (Signed)
Pt called and would like a refill on her Prilosec called in. jw

## 2015-07-04 NOTE — Telephone Encounter (Signed)
Refill request from pt. Will forward to PCP for review. Pearlee Arvizu, CMA. 

## 2015-07-05 ENCOUNTER — Other Ambulatory Visit: Payer: Self-pay | Admitting: Family Medicine

## 2015-07-05 NOTE — Telephone Encounter (Signed)
Refill request from pharmacy. Will forward to PCP for review. Nneoma Harral, CMA. 

## 2015-07-05 NOTE — Telephone Encounter (Signed)
Pt called to check the status of her request for a refill on Prilosec. She needs this ASAP. Deborah [name]

## 2015-07-06 ENCOUNTER — Other Ambulatory Visit: Payer: Self-pay | Admitting: Family Medicine

## 2015-07-06 ENCOUNTER — Encounter (HOSPITAL_COMMUNITY): Payer: Self-pay

## 2015-07-06 ENCOUNTER — Encounter (HOSPITAL_COMMUNITY)
Admission: RE | Admit: 2015-07-06 | Discharge: 2015-07-06 | Disposition: A | Discharge status: Home / Self Care | Payer: Medicaid Other | Source: Ambulatory Visit | Attending: General Surgery | Admitting: General Surgery

## 2015-07-06 DIAGNOSIS — K8020 Calculus of gallbladder without cholecystitis without obstruction: Secondary | ICD-10-CM | POA: Insufficient documentation

## 2015-07-06 DIAGNOSIS — Z01812 Encounter for preprocedural laboratory examination: Secondary | ICD-10-CM | POA: Insufficient documentation

## 2015-07-06 HISTORY — DX: Essential (primary) hypertension: I10

## 2015-07-06 LAB — COMPREHENSIVE METABOLIC PANEL
ALBUMIN: 3.2 g/dL — AB (ref 3.5–5.0)
ALT: 17 U/L (ref 14–54)
AST: 29 U/L (ref 15–41)
Alkaline Phosphatase: 69 U/L (ref 38–126)
Anion gap: 11 (ref 5–15)
BILIRUBIN TOTAL: 1.1 mg/dL (ref 0.3–1.2)
BUN: 13 mg/dL (ref 6–20)
CALCIUM: 8.7 mg/dL — AB (ref 8.9–10.3)
CO2: 23 mmol/L (ref 22–32)
Chloride: 104 mmol/L (ref 101–111)
Creatinine, Ser: 0.93 mg/dL (ref 0.44–1.00)
GFR calc Af Amer: >60 mL/min (ref >60)
GFR calc non Af Amer: >60 mL/min (ref >60)
GLUCOSE: 114 mg/dL — AB (ref 65–99)
POTASSIUM: 4.2 mmol/L (ref 3.5–5.1)
SODIUM: 138 mmol/L (ref 135–145)
TOTAL PROTEIN: 7.5 g/dL (ref 6.5–8.1)

## 2015-07-06 LAB — CBC WITH DIFFERENTIAL/PLATELET
Basophils Absolute: 0 10*3/uL (ref 0.0–0.1)
Basophils Relative: 0 % (ref 0–1)
EOS ABS: 0.2 10*3/uL (ref 0.0–0.7)
EOS PCT: 4 % (ref 0–5)
HCT: 40.2 % (ref 36.0–46.0)
Hemoglobin: 13.2 g/dL (ref 12.0–15.0)
LYMPHS ABS: 1.1 10*3/uL (ref 0.7–4.0)
Lymphocytes Relative: 23 % (ref 12–46)
MCH: 29.5 pg (ref 26.0–34.0)
MCHC: 32.8 g/dL (ref 30.0–36.0)
MCV: 89.9 fL (ref 78.0–100.0)
Monocytes Absolute: 0.4 10*3/uL (ref 0.1–1.0)
Monocytes Relative: 8 % (ref 3–12)
Neutro Abs: 3 10*3/uL (ref 1.7–7.7)
Neutrophils Relative %: 65 % (ref 43–77)
PLATELETS: 299 10*3/uL (ref 150–400)
RBC: 4.47 MIL/uL (ref 3.87–5.11)
RDW: 14.3 % (ref 11.5–15.5)
WBC: 4.7 10*3/uL (ref 4.0–10.5)

## 2015-07-06 MED ORDER — OMEPRAZOLE 20 MG PO CPDR: 20.0000 mg | DELAYED_RELEASE_CAPSULE | Freq: Two times a day (BID) | ORAL | Status: DC

## 2015-07-06 MED ORDER — CHLORHEXIDINE GLUCONATE 4 % EX LIQD: 1.0000 "application " | Freq: Once | CUTANEOUS | Status: DC

## 2015-07-06 NOTE — Pre-Procedure Instructions (Signed)
    Deborah [name]  07/06/2015      [facility], [place] [address] Phone: [phone] Fax: [phone]    Your procedure is scheduled on 07/08/15.  Report to [facility] Admitting at 530 A.M.  Call this number if you have problems the morning of surgery:  [phone]   Remember:  Do not eat food or drink liquids after midnight.  Take these medicines the morning of surgery with A SIP OF WATER --tylenol,carvedilol,valium,hydrocodone,prilosec,zoloft   Do not wear jewelry, make-up or nail polish.  Do not wear lotions, powders, or perfumes.  You may wear deodorant.  Do not shave 48 hours prior to surgery.  Men may shave face and neck.  Do not bring valuables to the hospital.  [facility] is not responsible for any belongings or valuables.  Contacts, dentures or bridgework may not be worn into surgery.  Leave your suitcase in the car.  After surgery it may be brought to your room.  For patients admitted to the hospital, discharge time will be determined by your treatment team.  Patients discharged the day of surgery will not be allowed to drive home.   Name and phone number of your driver:    Special instructions:    Please read over the following fact sheets that you were given. Pain Booklet, Coughing and Deep Breathing and Surgical Site Infection Prevention

## 2015-07-06 NOTE — Telephone Encounter (Signed)
Refill completed for few days. PCP to give refill when available.

## 2015-07-06 NOTE — Telephone Encounter (Signed)
Pt is calling for the third time and see is still waiting on her Prilosec to be filled. She is having surgery on Friday and is trying to get everything done. jw

## 2015-07-06 NOTE — Telephone Encounter (Signed)
Will forward to preceptor Dr. Gwendlyn Deutscher to see if she can fill.  PCP is out office this week.  Tristram Milian,CMA

## 2015-07-07 MED ORDER — DEXTROSE 5 % IV SOLN: 2.0000 g | INTRAVENOUS | Status: DC

## 2015-07-07 MED ORDER — OMEPRAZOLE 20 MG PO CPDR: 20.0000 mg | DELAYED_RELEASE_CAPSULE | Freq: Every day | ORAL | Status: DC

## 2015-07-07 MED ORDER — DEXTROSE 5 % IV SOLN
2.0000 g | INTRAVENOUS | Status: AC
  Administered 2015-07-08: 2 g via INTRAVENOUS
  Filled 2015-07-07: qty 2

## 2015-07-07 NOTE — Telephone Encounter (Signed)
Refilled

## 2015-07-07 NOTE — Addendum Note (Signed)
Addended by: Aquilla Hacker on: 07/07/2015 12:07 AM   Modules accepted: Orders

## 2015-07-08 ENCOUNTER — Ambulatory Visit (HOSPITAL_COMMUNITY): Payer: Medicaid Other

## 2015-07-08 ENCOUNTER — Ambulatory Visit (HOSPITAL_COMMUNITY): Payer: Medicaid Other | Admitting: Anesthesiology

## 2015-07-08 ENCOUNTER — Encounter (HOSPITAL_COMMUNITY): Payer: Self-pay

## 2015-07-08 ENCOUNTER — Encounter (HOSPITAL_COMMUNITY)
Admission: RE | Disposition: A | Discharge status: Home / Self Care | Payer: Self-pay | Source: Ambulatory Visit | Attending: General Surgery

## 2015-07-08 ENCOUNTER — Ambulatory Visit (HOSPITAL_COMMUNITY)
Admission: RE | Admit: 2015-07-08 | Discharge: 2015-07-08 | Disposition: A | Discharge status: Home / Self Care | Payer: Medicaid Other | Source: Ambulatory Visit | Attending: General Surgery | Admitting: General Surgery

## 2015-07-08 DIAGNOSIS — Z923 Personal history of irradiation: Secondary | ICD-10-CM | POA: Insufficient documentation

## 2015-07-08 DIAGNOSIS — Z6841 Body Mass Index (BMI) 40.0 and over, adult: Secondary | ICD-10-CM | POA: Insufficient documentation

## 2015-07-08 DIAGNOSIS — M549 Dorsalgia, unspecified: Secondary | ICD-10-CM | POA: Insufficient documentation

## 2015-07-08 DIAGNOSIS — E780 Pure hypercholesterolemia: Secondary | ICD-10-CM | POA: Insufficient documentation

## 2015-07-08 DIAGNOSIS — F419 Anxiety disorder, unspecified: Secondary | ICD-10-CM | POA: Insufficient documentation

## 2015-07-08 DIAGNOSIS — K649 Unspecified hemorrhoids: Secondary | ICD-10-CM | POA: Insufficient documentation

## 2015-07-08 DIAGNOSIS — M1990 Unspecified osteoarthritis, unspecified site: Secondary | ICD-10-CM | POA: Insufficient documentation

## 2015-07-08 DIAGNOSIS — K8010 Calculus of gallbladder with chronic cholecystitis without obstruction: Secondary | ICD-10-CM | POA: Diagnosis not present

## 2015-07-08 DIAGNOSIS — F329 Major depressive disorder, single episode, unspecified: Secondary | ICD-10-CM | POA: Insufficient documentation

## 2015-07-08 DIAGNOSIS — K8020 Calculus of gallbladder without cholecystitis without obstruction: Secondary | ICD-10-CM

## 2015-07-08 DIAGNOSIS — K219 Gastro-esophageal reflux disease without esophagitis: Secondary | ICD-10-CM | POA: Insufficient documentation

## 2015-07-08 DIAGNOSIS — Z79899 Other long term (current) drug therapy: Secondary | ICD-10-CM | POA: Insufficient documentation

## 2015-07-08 DIAGNOSIS — Z7982 Long term (current) use of aspirin: Secondary | ICD-10-CM | POA: Insufficient documentation

## 2015-07-08 DIAGNOSIS — G43909 Migraine, unspecified, not intractable, without status migrainosus: Secondary | ICD-10-CM | POA: Insufficient documentation

## 2015-07-08 DIAGNOSIS — I10 Essential (primary) hypertension: Secondary | ICD-10-CM | POA: Insufficient documentation

## 2015-07-08 DIAGNOSIS — Z90710 Acquired absence of both cervix and uterus: Secondary | ICD-10-CM | POA: Insufficient documentation

## 2015-07-08 DIAGNOSIS — Z8542 Personal history of malignant neoplasm of other parts of uterus: Secondary | ICD-10-CM | POA: Insufficient documentation

## 2015-07-08 HISTORY — PX: CHOLECYSTECTOMY: SHX55

## 2015-07-08 SURGERY — LAPAROSCOPIC CHOLECYSTECTOMY WITH INTRAOPERATIVE CHOLANGIOGRAM
Anesthesia: General | Site: Abdomen

## 2015-07-08 MED ORDER — OXYCODONE-ACETAMINOPHEN 5-325 MG PO TABS: 1.0000 | ORAL_TABLET | ORAL | Status: DC | PRN

## 2015-07-08 MED ORDER — ACETAMINOPHEN 10 MG/ML IV SOLN: 1000.0000 mg | Freq: Four times a day (QID) | INTRAVENOUS | Status: DC

## 2015-07-08 MED ORDER — ONDANSETRON HCL 4 MG/2ML IJ SOLN
INTRAMUSCULAR | Status: DC | PRN
  Administered 2015-07-08: 4 mg via INTRAVENOUS

## 2015-07-08 MED ORDER — LIDOCAINE HCL (CARDIAC) 20 MG/ML IV SOLN
INTRAVENOUS | Status: DC | PRN
  Administered 2015-07-08: 80 mg via INTRAVENOUS

## 2015-07-08 MED ORDER — LACTATED RINGERS IV SOLN
INTRAVENOUS | Status: DC | PRN
  Administered 2015-07-08 (×2): via INTRAVENOUS

## 2015-07-08 MED ORDER — BUPIVACAINE-EPINEPHRINE 0.25% -1:200000 IJ SOLN
INTRAMUSCULAR | Status: DC | PRN
  Administered 2015-07-08: 30 mL

## 2015-07-08 MED ORDER — ACETAMINOPHEN 10 MG/ML IV SOLN
1000.0000 mg | Freq: Once | INTRAVENOUS | Status: AC
  Administered 2015-07-08: 1000 mg via INTRAVENOUS
  Filled 2015-07-08: qty 100

## 2015-07-08 MED ORDER — NEOSTIGMINE METHYLSULFATE 10 MG/10ML IV SOLN
INTRAVENOUS | Status: AC
Start: 2015-07-08 — End: 2015-07-08
  Filled 2015-07-08: qty 1

## 2015-07-08 MED ORDER — ONDANSETRON HCL 4 MG/2ML IJ SOLN
INTRAMUSCULAR | Status: AC
  Filled 2015-07-08: qty 2

## 2015-07-08 MED ORDER — FENTANYL CITRATE (PF) 250 MCG/5ML IJ SOLN
INTRAMUSCULAR | Status: AC
  Filled 2015-07-08: qty 5

## 2015-07-08 MED ORDER — SUCCINYLCHOLINE CHLORIDE 20 MG/ML IJ SOLN
INTRAMUSCULAR | Status: AC
  Filled 2015-07-08: qty 1

## 2015-07-08 MED ORDER — 0.9 % SODIUM CHLORIDE (POUR BTL) OPTIME
TOPICAL | Status: DC | PRN
  Administered 2015-07-08: 1000 mL

## 2015-07-08 MED ORDER — GLYCOPYRROLATE 0.2 MG/ML IJ SOLN
INTRAMUSCULAR | Status: DC | PRN
  Administered 2015-07-08: 0.4 mg via INTRAVENOUS

## 2015-07-08 MED ORDER — ROCURONIUM BROMIDE 50 MG/5ML IV SOLN
INTRAVENOUS | Status: AC
  Filled 2015-07-08: qty 1

## 2015-07-08 MED ORDER — HYDROMORPHONE HCL 1 MG/ML IJ SOLN
INTRAMUSCULAR | Status: AC
  Filled 2015-07-08: qty 1

## 2015-07-08 MED ORDER — SUCCINYLCHOLINE CHLORIDE 200 MG/10ML IV SOSY
PREFILLED_SYRINGE | INTRAVENOUS | Status: DC | PRN
  Administered 2015-07-08: 140 mg via INTRAVENOUS

## 2015-07-08 MED ORDER — ROCURONIUM BROMIDE 100 MG/10ML IV SOLN
INTRAVENOUS | Status: DC | PRN
  Administered 2015-07-08: 20 mg via INTRAVENOUS
  Administered 2015-07-08: 30 mg via INTRAVENOUS

## 2015-07-08 MED ORDER — BUPIVACAINE-EPINEPHRINE (PF) 0.25% -1:200000 IJ SOLN
INTRAMUSCULAR | Status: AC
  Filled 2015-07-08: qty 30

## 2015-07-08 MED ORDER — SODIUM CHLORIDE 0.9 % IR SOLN
Status: DC | PRN
  Administered 2015-07-08 (×2): 1000 mL

## 2015-07-08 MED ORDER — LIDOCAINE HCL (CARDIAC) 20 MG/ML IV SOLN
INTRAVENOUS | Status: AC
  Filled 2015-07-08: qty 5

## 2015-07-08 MED ORDER — MIDAZOLAM HCL 2 MG/2ML IJ SOLN
INTRAMUSCULAR | Status: AC
  Filled 2015-07-08: qty 4

## 2015-07-08 MED ORDER — PROPOFOL 10 MG/ML IV BOLUS
INTRAVENOUS | Status: DC | PRN
  Administered 2015-07-08: 200 mg via INTRAVENOUS

## 2015-07-08 MED ORDER — GLYCOPYRROLATE 0.2 MG/ML IJ SOLN
INTRAMUSCULAR | Status: AC
  Filled 2015-07-08: qty 2

## 2015-07-08 MED ORDER — ACETAMINOPHEN 10 MG/ML IV SOLN
INTRAVENOUS | Status: AC
  Filled 2015-07-08: qty 100

## 2015-07-08 MED ORDER — KETOROLAC TROMETHAMINE 30 MG/ML IJ SOLN
30.0000 mg | Freq: Once | INTRAMUSCULAR | Status: AC
  Administered 2015-07-08: 30 mg via INTRAVENOUS

## 2015-07-08 MED ORDER — MIDAZOLAM HCL 2 MG/2ML IJ SOLN: 0.5000 mg | Freq: Once | INTRAMUSCULAR | Status: DC | PRN

## 2015-07-08 MED ORDER — MIDAZOLAM HCL 2 MG/2ML IJ SOLN
INTRAMUSCULAR | Status: AC
  Filled 2015-07-08: qty 2

## 2015-07-08 MED ORDER — MEPERIDINE HCL 25 MG/ML IJ SOLN: 6.2500 mg | INTRAMUSCULAR | Status: DC | PRN

## 2015-07-08 MED ORDER — HYDROMORPHONE HCL 1 MG/ML IJ SOLN
INTRAMUSCULAR | Status: AC
  Administered 2015-07-08: 0.5 mg via INTRAVENOUS
  Filled 2015-07-08: qty 1

## 2015-07-08 MED ORDER — HYDROMORPHONE HCL 1 MG/ML IJ SOLN
0.2500 mg | INTRAMUSCULAR | Status: DC | PRN
  Administered 2015-07-08 (×4): 0.5 mg via INTRAVENOUS

## 2015-07-08 MED ORDER — PROPOFOL 10 MG/ML IV BOLUS
INTRAVENOUS | Status: AC
  Filled 2015-07-08: qty 20

## 2015-07-08 MED ORDER — FENTANYL CITRATE (PF) 100 MCG/2ML IJ SOLN
INTRAMUSCULAR | Status: DC | PRN
  Administered 2015-07-08: 100 ug via INTRAVENOUS
  Administered 2015-07-08 (×3): 50 ug via INTRAVENOUS

## 2015-07-08 MED ORDER — KETOROLAC TROMETHAMINE 30 MG/ML IJ SOLN
INTRAMUSCULAR | Status: AC
  Filled 2015-07-08: qty 1

## 2015-07-08 MED ORDER — HYDROMORPHONE HCL 1 MG/ML IJ SOLN
0.5000 mg | INTRAMUSCULAR | Status: DC | PRN
  Administered 2015-07-08: 0.5 mg via INTRAVENOUS

## 2015-07-08 MED ORDER — NEOSTIGMINE METHYLSULFATE 10 MG/10ML IV SOLN
INTRAVENOUS | Status: DC | PRN
  Administered 2015-07-08: 3 mg via INTRAVENOUS

## 2015-07-08 MED ORDER — SODIUM CHLORIDE 0.9 % IV SOLN
INTRAVENOUS | Status: DC | PRN
  Administered 2015-07-08: 5 mL

## 2015-07-08 MED ORDER — PROMETHAZINE HCL 25 MG/ML IJ SOLN: 6.2500 mg | INTRAMUSCULAR | Status: DC | PRN

## 2015-07-08 MED ORDER — MIDAZOLAM HCL 5 MG/5ML IJ SOLN
INTRAMUSCULAR | Status: DC | PRN
  Administered 2015-07-08: 2 mg via INTRAVENOUS

## 2015-07-08 SURGICAL SUPPLY — 45 items
ADH SKN CLS APL DERMABOND .7 (GAUZE/BANDAGES/DRESSINGS) ×1
APPLIER CLIP 5 13 M/L LIGAMAX5 (MISCELLANEOUS) ×3
APR CLP MED LRG 5 ANG JAW (MISCELLANEOUS) ×1
BAG SPEC RTRVL LRG 6X4 10 (ENDOMECHANICALS) ×1
BLADE SURG ROTATE 9660 (MISCELLANEOUS) IMPLANT
CANISTER SUCTION 2500CC (MISCELLANEOUS) ×3 IMPLANT
CHLORAPREP W/TINT 26ML (MISCELLANEOUS) ×3 IMPLANT
CLIP APPLIE 5 13 M/L LIGAMAX5 (MISCELLANEOUS) ×1 IMPLANT
CLOSURE WOUND 1/2 X4 (GAUZE/BANDAGES/DRESSINGS) ×1
COVER MAYO STAND STRL (DRAPES) ×2 IMPLANT
COVER SURGICAL LIGHT HANDLE (MISCELLANEOUS) ×3 IMPLANT
DERMABOND ADVANCED (GAUZE/BANDAGES/DRESSINGS) ×2
DERMABOND ADVANCED .7 DNX12 (GAUZE/BANDAGES/DRESSINGS) ×1 IMPLANT
DRAPE C-ARM 42X72 X-RAY (DRAPES) ×2 IMPLANT
DRSG TEGADERM 2-3/8X2-3/4 SM (GAUZE/BANDAGES/DRESSINGS) ×12 IMPLANT
ELECT REM PT RETURN 9FT ADLT (ELECTROSURGICAL) ×3
ELECTRODE REM PT RTRN 9FT ADLT (ELECTROSURGICAL) ×1 IMPLANT
GLOVE BIO SURGEON STRL SZ7 (GLOVE) ×2 IMPLANT
GLOVE BIOGEL PI IND STRL 6.5 (GLOVE) IMPLANT
GLOVE BIOGEL PI IND STRL 7.0 (GLOVE) IMPLANT
GLOVE BIOGEL PI IND STRL 8 (GLOVE) ×1 IMPLANT
GLOVE BIOGEL PI INDICATOR 6.5 (GLOVE) ×4
GLOVE BIOGEL PI INDICATOR 7.0 (GLOVE) ×2
GLOVE BIOGEL PI INDICATOR 8 (GLOVE) ×2
GLOVE ECLIPSE 7.5 STRL STRAW (GLOVE) ×3 IMPLANT
GOWN STRL REUS W/ TWL LRG LVL3 (GOWN DISPOSABLE) ×3 IMPLANT
GOWN STRL REUS W/TWL LRG LVL3 (GOWN DISPOSABLE) ×9
KIT BASIN OR (CUSTOM PROCEDURE TRAY) ×3 IMPLANT
KIT ROOM TURNOVER OR (KITS) ×3 IMPLANT
NS IRRIG 1000ML POUR BTL (IV SOLUTION) ×3 IMPLANT
PAD ARMBOARD 7.5X6 YLW CONV (MISCELLANEOUS) ×3 IMPLANT
POUCH SPECIMEN RETRIEVAL 10MM (ENDOMECHANICALS) ×2 IMPLANT
SCISSORS LAP 5X35 DISP (ENDOMECHANICALS) ×3 IMPLANT
SET CHOLANGIOGRAPH 5 50 .035 (SET/KITS/TRAYS/PACK) ×2 IMPLANT
SET IRRIG TUBING LAPAROSCOPIC (IRRIGATION / IRRIGATOR) ×3 IMPLANT
SLEEVE ENDOPATH XCEL 5M (ENDOMECHANICALS) ×6 IMPLANT
SPECIMEN JAR SMALL (MISCELLANEOUS) ×3 IMPLANT
STRIP CLOSURE SKIN 1/2X4 (GAUZE/BANDAGES/DRESSINGS) ×2 IMPLANT
SUT MNCRL AB 4-0 PS2 18 (SUTURE) ×3 IMPLANT
TOWEL OR 17X24 6PK STRL BLUE (TOWEL DISPOSABLE) ×3 IMPLANT
TOWEL OR 17X26 10 PK STRL BLUE (TOWEL DISPOSABLE) ×1 IMPLANT
TRAY LAPAROSCOPIC MC (CUSTOM PROCEDURE TRAY) ×3 IMPLANT
TROCAR XCEL BLUNT TIP 100MML (ENDOMECHANICALS) ×3 IMPLANT
TROCAR XCEL NON-BLD 5MMX100MML (ENDOMECHANICALS) ×3 IMPLANT
TUBING INSUFFLATION (TUBING) ×3 IMPLANT

## 2015-07-08 NOTE — Progress Notes (Signed)
Pt ambulated to bathroom without difficulty and voided large amt. clear urine. Taking liquids without diff.  No c/o nausea or pain.

## 2015-07-08 NOTE — Anesthesia Procedure Notes (Signed)
Procedure Name: Intubation Date/Time: 07/08/2015 7:51 AM Performed by: Manus Gunning, Hilary Pundt J Pre-anesthesia Checklist: Patient identified, Timeout performed, Emergency Drugs available, Suction available and Patient being monitored Patient Re-evaluated:Patient Re-evaluated prior to inductionOxygen Delivery Method: Circle system utilized Preoxygenation: Pre-oxygenation with 100% oxygen Intubation Type: IV induction Laryngoscope Size: Mac and 3 Grade View: Grade II Tube type: Oral Tube size: 7.5 mm Number of attempts: 1 Airway Equipment and Method: Patient positioned with wedge pillow Placement Confirmation: ETT inserted through vocal cords under direct vision,  breath sounds checked- equal and bilateral and positive ETCO2 Secured at: 22 cm Tube secured with: Tape Dental Injury: Teeth and Oropharynx as per pre-operative assessment

## 2015-07-08 NOTE — Anesthesia Postprocedure Evaluation (Signed)
  Anesthesia Post-op Note  Patient: Deborah [name]  Procedure(s) Performed: Procedure(s): LAPAROSCOPIC CHOLECYSTECTOMY WITH INTRAOPERATIVE CHOLANGIOGRAM (N/A)  Patient Location: PACU  Anesthesia Type:General  Level of Consciousness: awake, alert , oriented and patient cooperative  Airway and Oxygen Therapy: Patient Spontanous Breathing  Post-op Pain: mild  Post-op Assessment: Post-op Vital signs reviewed, Patient's Cardiovascular Status Stable, Respiratory Function Stable, Patent Airway, No signs of Nausea or vomiting and Pain level controlled              Post-op Vital Signs: Reviewed and stable  Last Vitals:  Filed Vitals:   07/08/15 1222  BP: 127/76  Pulse: 74  Temp: 36.7 C  Resp: 16    Complications: No apparent anesthesia complications

## 2015-07-08 NOTE — Anesthesia Preprocedure Evaluation (Signed)
Anesthesia Evaluation  Patient identified by MRN, date of birth, ID band Patient awake    Reviewed: Allergy & Precautions, NPO status , Patient's Chart, lab work & pertinent test results  History of Anesthesia Complications Negative for: history of anesthetic complications  Airway Mallampati: I  TM Distance: >3 FB Neck ROM: Full    Dental  (+) Teeth Intact, Dental Advisory Given   Pulmonary neg pulmonary ROS,  breath sounds clear to auscultation        Cardiovascular hypertension, Pt. on medications and Pt. on home beta blockers Rhythm:Regular Rate:Normal  Echo (9/15): EF 50-55%, normal wall motion, grade 2 diastolic    Neuro/Psych negative neurological ROS     GI/Hepatic Neg liver ROS, GERD-  Medicated and Controlled,  Endo/Other  Morbid obesity  Renal/GU negative Renal ROS     Musculoskeletal  (+) Arthritis -,   Abdominal (+) + obese,   Peds  Hematology negative hematology ROS (+)   Anesthesia Other Findings   Reproductive/Obstetrics                             Anesthesia Physical Anesthesia Plan  ASA: III  Anesthesia Plan: General   Post-op Pain Management:    Induction: Intravenous  Airway Management Planned: Oral ETT  Additional Equipment:   Intra-op Plan:   Post-operative Plan: Extubation in OR  Informed Consent: I have reviewed the patients History and Physical, chart, labs and discussed the procedure including the risks, benefits and alternatives for the proposed anesthesia with the patient or authorized representative who has indicated his/her understanding and acceptance.   Dental advisory given  Plan Discussed with: CRNA and Surgeon  Anesthesia Plan Comments: (Plan routine monitors, GETA)        Anesthesia Quick Evaluation

## 2015-07-08 NOTE — Transfer of Care (Signed)
Immediate Anesthesia Transfer of Care Note  Patient: Deborah [name]  Procedure(s) Performed: Procedure(s): LAPAROSCOPIC CHOLECYSTECTOMY WITH INTRAOPERATIVE CHOLANGIOGRAM (N/A)  Patient Location: PACU  Anesthesia Type:General  Level of Consciousness: awake and alert   Airway & Oxygen Therapy: Patient Spontanous Breathing  Post-op Assessment: Report given to RN and Post -op Vital signs reviewed and stable  Post vital signs: Reviewed and stable  Last Vitals:  Filed Vitals:   07/08/15 0946  BP:   Pulse:   Temp: 36.4 C  Resp:     Complications: No apparent anesthesia complications

## 2015-07-08 NOTE — Discharge Instructions (Signed)

## 2015-07-08 NOTE — Addendum Note (Signed)
Addendum  created 07/08/15 1540 by Mcdaniel Ohms J Valisha Heslin, CRNA   Modules edited: Anesthesia Attestations    

## 2015-07-08 NOTE — H&P (Signed)
Deborah [name] 03/22/2015 11:43 AM Location: [facility] Patient #: [id number] DOB: 11-Dec-1965 Single / Language: Cleophus Molt / Race: Black or African American Female  History of Present Illness (Deborah Eversole LPN; 3/97/6734 19:37 AM) Patient words: GB.  The patient is a 49 year old female    Other Problems (Deborah Eversole, LPN; 07/28/4096 35:32 AM) Anxiety Disorder Arthritis Back Pain Cancer Cholelithiasis Depression Gastric Ulcer Gastroesophageal Reflux Disease Hemorrhoids Hypercholesterolemia Migraine Headache  Past Surgical History (Deborah Eversole, LPN; 9/92/4268 34:19 AM) Hysterectomy (due to cancer) - Complete  Diagnostic Studies History (Deborah Eversole, LPN; 05/17/2978 89:21 AM) Colonoscopy never Mammogram within last year Pap Smear 1-5 years ago  Allergies (Deborah Eversole, LPN; 1/94/1740 81:44 AM) No Known Drug Allergies04/26/2016  Medication History (Deborah Eversole, LPN; 07/13/5630 49:70 AM) Tylenol Extra Strength (500MG  Tablet, Oral) Active. Aspirin EC (81MG  Tablet DR, Oral) Active. Align (Oral) Active. Coreg (3.125MG  Tablet, Oral) Active. Flexeril (10MG  Tablet, Oral) Active. Valium (5MG  Tablet, Oral) Active. Colace (50MG  Capsule, Oral) Active. Lasix (20MG  Tablet, Oral) Active. Norco (5-325MG  Tablet, Oral) Active. Ibuprofen (800MG  Tablet, Oral) Active. Lactase (3000UNIT Tablet, Oral) Active. Imodium (2MG  Capsule, Oral) Active. Slow-Mag (535 (64 Mg)MG Tablet ER, Oral) Active. Nasonex (50MCG/ACT Suspension, Nasal) Active. PriLOSEC (20MG  Capsule DR, Oral) Active. Zofran (4MG  Tablet, Oral) Active. Percocet (5-325MG  Tablet, Oral) Active. GlycoLax (Oral) Active. K-Dur (10MEQ Tablet ER, Oral) Active. Zoloft (100MG  Tablet, Oral) Active.  Social History (Deborah Eversole, LPN; 2/63/7858 85:02 AM) Alcohol use Occasional alcohol use. Caffeine use Carbonated beverages. No drug use Tobacco use Never  smoker.  Family History Deborah Borer, LPN; 7/74/1287 86:76 AM) Hypertension Mother.  Pregnancy / Birth History Deborah Borer, LPN; 06/14/9469 96:28 AM) Age at menarche 57 years. Age of menopause 64-50 Gravida 2 Maternal age 23-20 Para 1  Review of Systems (Deborah Eversole LPN; 3/66/2947 65:46 AM) General Present- Night Sweats and Weight Gain. Not Present- Appetite Loss, Chills, Fatigue, Fever and Weight Loss. Skin Not Present- Change in Wart/Mole, Dryness, Hives, Jaundice, New Lesions, Non-Healing Wounds, Rash and Ulcer. HEENT Present- Seasonal Allergies and Sinus Pain. Not Present- Earache, Hearing Loss, Hoarseness, Nose Bleed, Oral Ulcers, Ringing in the Ears, Sore Throat, Visual Disturbances, Wears glasses/contact lenses and Yellow Eyes. Respiratory Present- Snoring. Not Present- Bloody sputum, Chronic Cough, Difficulty Breathing and Wheezing. Breast Not Present- Breast Mass, Breast Pain, Nipple Discharge and Skin Changes. Cardiovascular Present- Swelling of Extremities. Not Present- Chest Pain, Difficulty Breathing Lying Down, Leg Cramps, Palpitations, Rapid Heart Rate and Shortness of Breath. Gastrointestinal Present- Abdominal Pain, Constipation and Indigestion. Not Present- Bloating, Bloody Stool, Change in Bowel Habits, Chronic diarrhea, Difficulty Swallowing, Excessive gas, Gets full quickly at meals, Hemorrhoids, Nausea, Rectal Pain and Vomiting. Female Genitourinary Present- Frequency. Not Present- Nocturia, Painful Urination, Pelvic Pain and Urgency. Musculoskeletal Present- Back Pain and Joint Stiffness. Not Present- Joint Pain, Muscle Pain, Muscle Weakness and Swelling of Extremities. Neurological Present- Headaches, Numbness and Tingling. Not Present- Decreased Memory, Fainting, Seizures, Tremor, Trouble walking and Weakness. Psychiatric Present- Anxiety and Depression. Not Present- Bipolar, Change in Sleep Pattern, Fearful and Frequent crying. Endocrine Present- Hot  flashes. Not Present- Cold Intolerance, Excessive Hunger, Hair Changes, Heat Intolerance and New Diabetes. Hematology Not Present- Easy Bruising, Excessive bleeding, Gland problems, HIV and Persistent Infections.   Vitals (Deborah Eversole LPN; 03/28/5464 68:12 AM) 03/22/2015 11:44 AM Weight: 320.2 lb Height: 66in Body Surface Area: 2.6 m Body Mass Index: 51.68 kg/m Temp.: 97.66F(Oral)  Pulse: 82 (Regular)  BP: 110/76 (Sitting, Left Arm, Standard)    Physical Exam (  Deborah [name]. Hulen Skains MD; 03/22/2015 12:08 PM) Chest and Lung Exam Chest and lung exam reveals -normal excursion with symmetric chest walls, quiet, even and easy respiratory effort with no use of accessory muscles and on auscultation, normal breath sounds, no adventitious sounds and normal vocal resonance.  Cardiovascular Cardiovascular examination reveals -normal heart sounds, regular rate and rhythm with no murmurs. Note: No murmurs  Abdomen Inspection Incisional scars - Laparotomy scar(As above from epigastric hernia repair.). Note: Epigastric scar from hernia repair. Palpation/Percussion Palpation and Percussion of the abdomen reveal - Soft and Non Tender. Auscultation Auscultation of the abdomen reveals - Bowel sounds normal.  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    Assessment & Plan Jeneen Rinks O. Timberlyn Pickford MD; 03/22/2015 12:10 PM) CHOLELITHIASIS WITH CHRONIC CHOLECYSTITIS (574.10  K80.10) Impression: Patietn has symptoms that wouold correlate with gallbladder trouble, but she has also had reflux in the past. . Wants to make sure that her symptoms are coming from her GB prior to scheduling surgery. I will order a HIDA scan for EF and cystic duct patency. Continue present management otherwise. Will see again in 1-3 weeks.  Patient's HIDA confirmed GB disease.  She has subsequently had several  episodes of pain.  FOr Lap chole with IOC today.  Deborah [name]. Dahlia Bailiff, MD, [place] [phone] [facility]

## 2015-07-08 NOTE — Op Note (Signed)
OPERATIVE REPORT  DATE OF OPERATION: 07/08/2015  PATIENT:  Deborah [name]  49 y.o. 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  After an adequate endotracheal anesthetic was administered, the patient was prepped with ChloroPrep, and then draped in the usual manner exposing the entire abdomen laterally, inferiorly and up  to the costal margins.  After a proper timeout was performed including identifying the patient and the procedure to be performed, a infraumbilical 3.2GM midline incision was made using a #15 blade.  This was taken down to the fascia which was then incised with a #15 blade.  We could not access the peritoneal cavity through this approach, therefore a 5 mm RUQ OptiView cannula was placed through which carbon dioxide gas was passed up to 15 mmHg.The edges of the fascia were tented up with Kocher clamps as the preperitoneal space was penetrated with a Kelly clamp into the peritoneum after the RUQ 12mm cannula was in place.  Once this was done, a pursestring suture of 0 Vicryl was passed around the fascial opening.  This was subsequently used to secure the [facility] cannula which was passed into the peritoneal cavity.  Once the [facility] cannula was in place, carbon dioxide gas was insufflated into the peritoneal cavity up to a maximal intra-abdominal pressure of 29mm Hg.The laparoscope, with attached camera and light source, was passed into the  peritoneal cavity to visualize the direct insertion of one additional right upper quadrant 33mm cannula, and a sup-xiphoid 29mm cannula.  Once all cannulas were in place, the dissection was begun.  Two ratcheted graspers were attached to the dome and infundibulum of the gallbladder and retracted towards the anterior abdominal wall and the right upper quadrant.  Omental adhesions had to be taken down using electrocautery and blunt dissection.  The gallbladder wall was markedly thickened and inflamed.  Using cautery attached to a dissecting forceps, the peritoneum overlaying the triangle of Chalot and the hepatoduodenal triangle was dissected away exposing the cystic duct and the cystic artery.  A critical window was developed between the CBD and the cystic duct The cystic artery was clipped proximally and distally then transected.  A clip was placed on the gallbladder side of the cystic duct, then a cholecystodochotomy made using the laparoscopic scissors.  Through the cholecystodochotomy a Cook catheter was passed to performed a cholangiogram.  The cholangiogram showed good flow into the duodenum, good proximal filling, no intraductal filling defects, and no dilatation..  Once the cholangiogram was completed, the [facility] catheter was removed, and the distal cystic duct was clipped multiple times then transected between the clips.  The gallbladder was then dissected out of the hepatic bed without event.  It was retrieved from the abdomen (using an EndoCatch bag) after there had been spillage of pus from the gallbladder and stones.  This was irrigated copiously and stones were retrieved prior to closure..  Once the gallbladder was removed, the bed was inspected for hemostasis.  Once excellent hemostasis was obtained all gas and fluids were aspirated  from above the liver, then the cannulas were removed.  The infraumbilical incision was closed using the pursestring suture which was in place.  0.25% bupivicaine with  epinephrine was injected at all sites.  All 69mm or greater cannula sites were close using a running subcuticular stitch of 4-0 Monocryl.  5.60mm cannula sites were closed with Dermabond only.Steri-Strips and Tagaderm were used to complete the dressings at all sites.  At this point all needle, sponge, and instrument counts were correct.The patient was awakened from anesthesia and taken to the PACU in stable condition.   PATIENT DISPOSITION:  PACU - hemodynamically stable.   [name] 8/12/20169:43 AM

## 2015-07-11 ENCOUNTER — Encounter (HOSPITAL_COMMUNITY): Payer: Self-pay | Admitting: General Surgery

## 2015-08-04 ENCOUNTER — Other Ambulatory Visit: Payer: Self-pay | Admitting: *Deleted

## 2015-08-04 MED ORDER — CARVEDILOL 3.125 MG PO TABS: ORAL_TABLET | ORAL | Status: DC

## 2015-08-09 ENCOUNTER — Other Ambulatory Visit: Payer: Self-pay | Admitting: Family Medicine

## 2015-08-09 DIAGNOSIS — C541 Malignant neoplasm of endometrium: Secondary | ICD-10-CM

## 2015-08-09 NOTE — Telephone Encounter (Signed)
If this is only a request from the pharmacy, and not a request from the patient as was noted in the earlier phone note, then it does not need to be refilled. Thanks  CGM MD

## 2015-08-09 NOTE — Telephone Encounter (Signed)
Refill request from pharmacy. Will forward to PCP for review. Raelynn Corron, CMA. 

## 2015-08-09 NOTE — Telephone Encounter (Signed)
Should not need ongoing tx with potassium. Is there some reason she is requesting this? Please call the pt. To find out. Her last potassium levels have been normal since July.

## 2015-08-09 NOTE — Telephone Encounter (Signed)
Pt would like a refill on potassium sent to [facility] on [address], A[place]

## 2015-08-20 IMAGING — US US ABDOMEN LIMITED
1 series · 14 of 25 positions shown · non-contrast
Comparison: Ultrasound February 05, 2015.

CLINICAL DATA: Acute right upper quadrant abdominal pain.

EXAM:
US ABDOMEN LIMITED - RIGHT UPPER QUADRANT

[Series 1: us abdomen limited · 0.27mm/px · 14 of 64 slices shown]
[im 1/64]
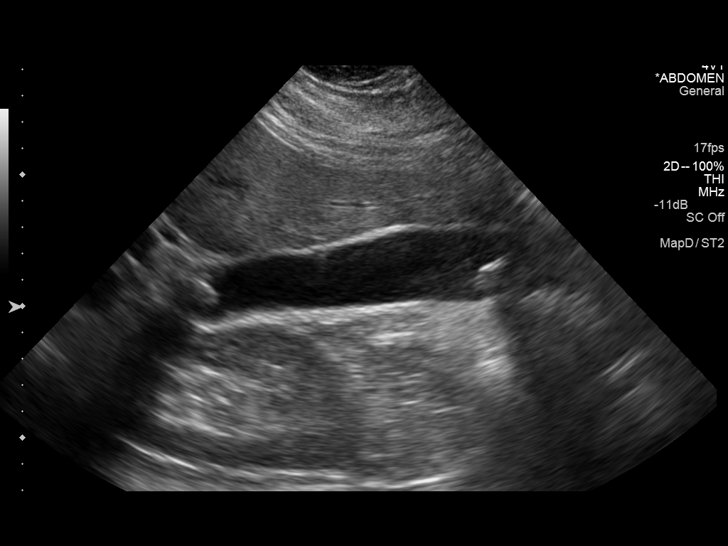
[im 6/64]
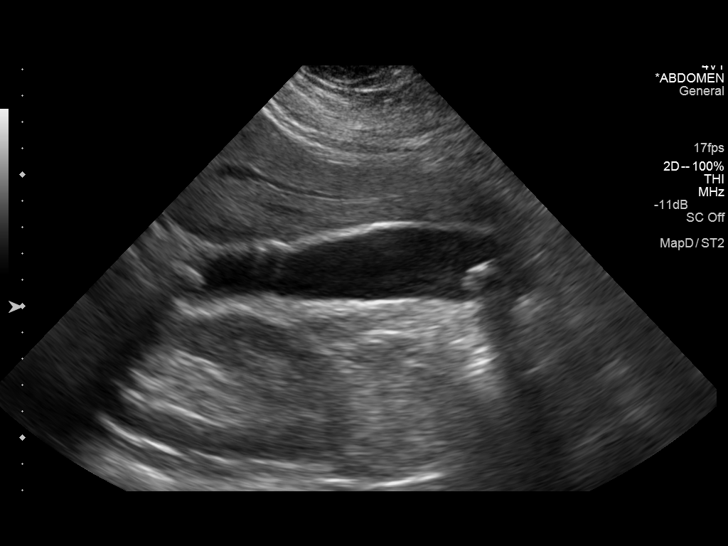
[im 11/64]
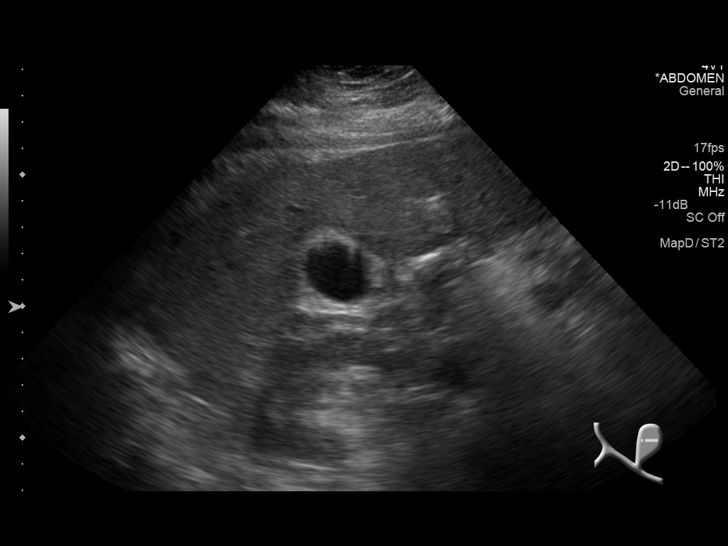
[im 16/64]
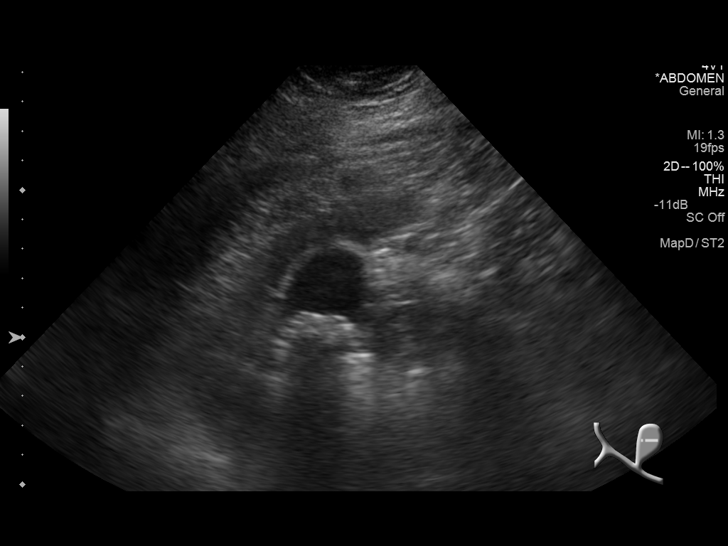
[im 22/64]
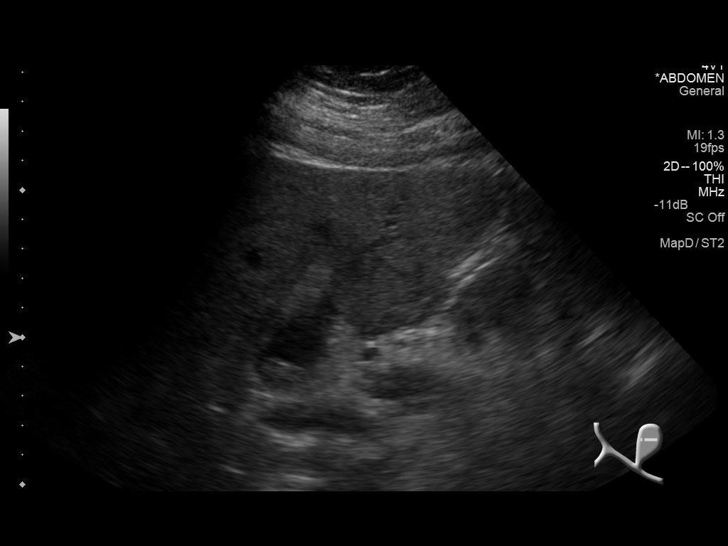
[im 24/64]
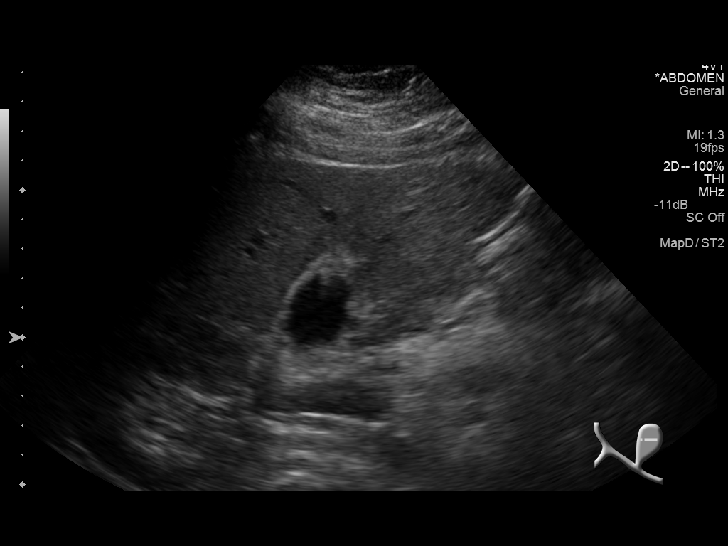
[im 29/64]
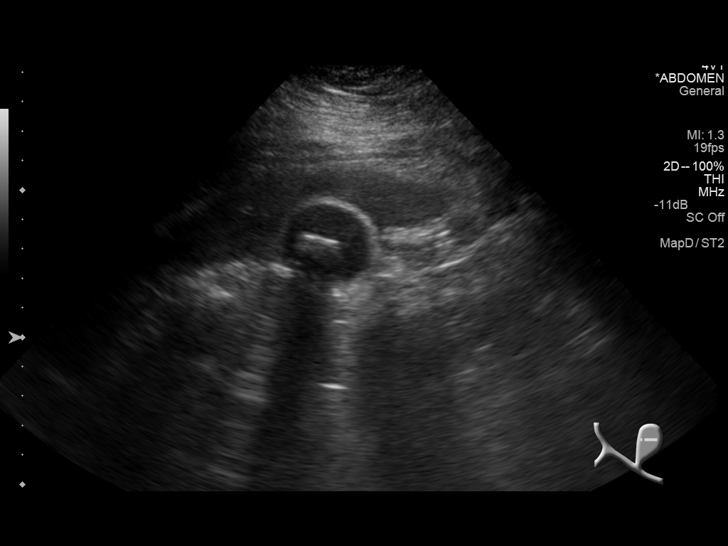
[im 35/64]
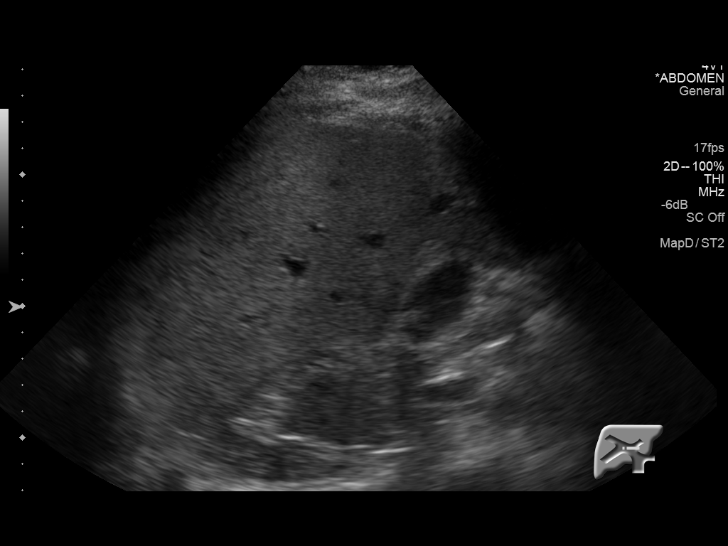
[im 40/64]
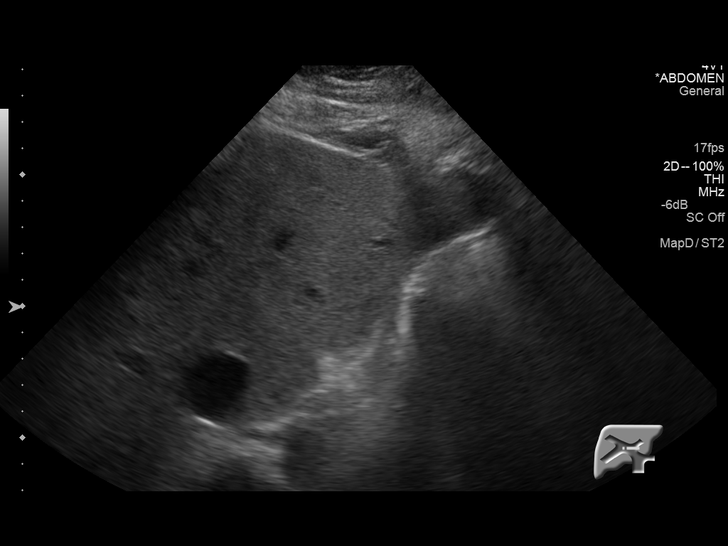
[im 43/64]
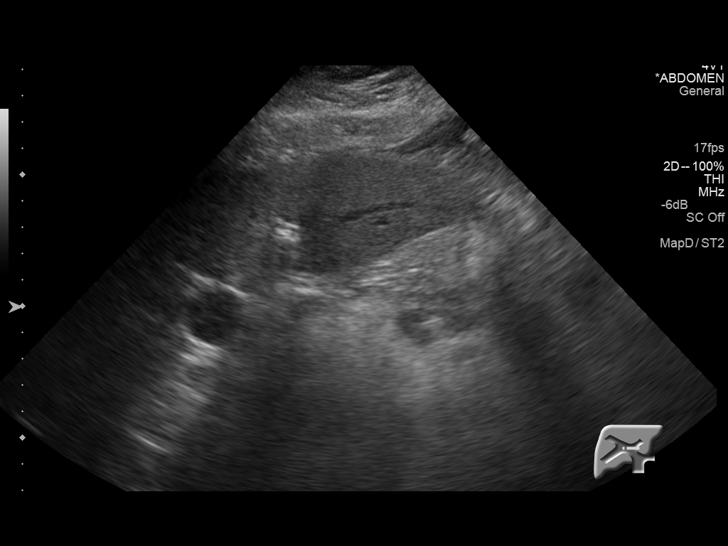
[im 48/64]
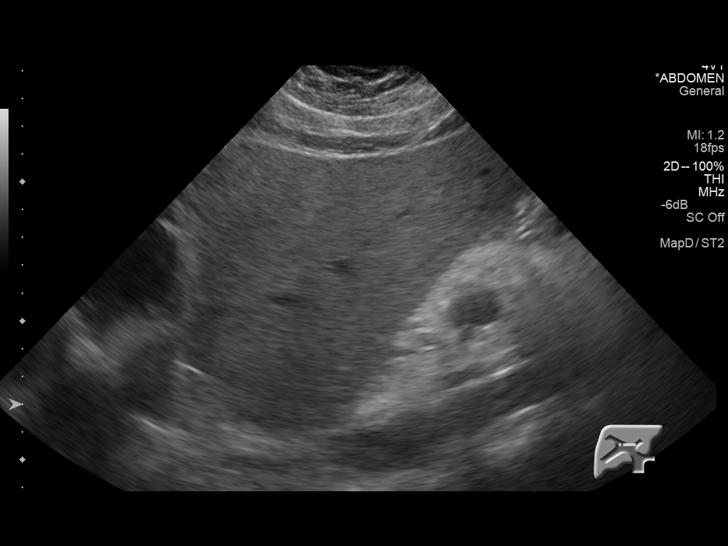
[im 53/64]
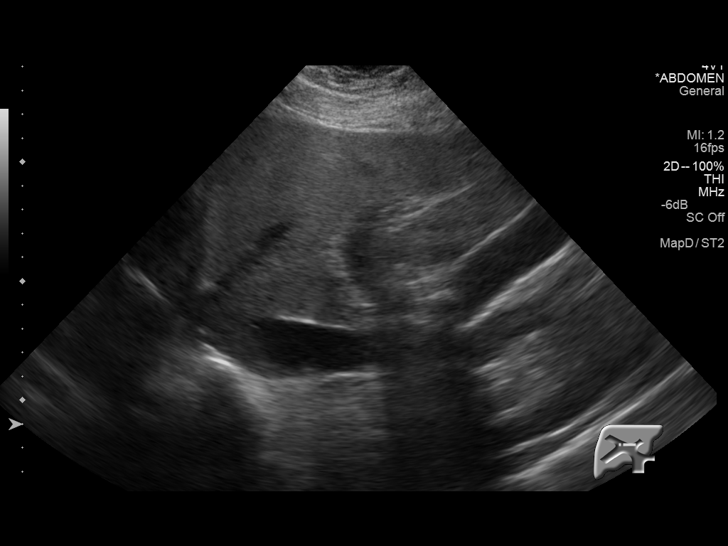
[im 58/64]
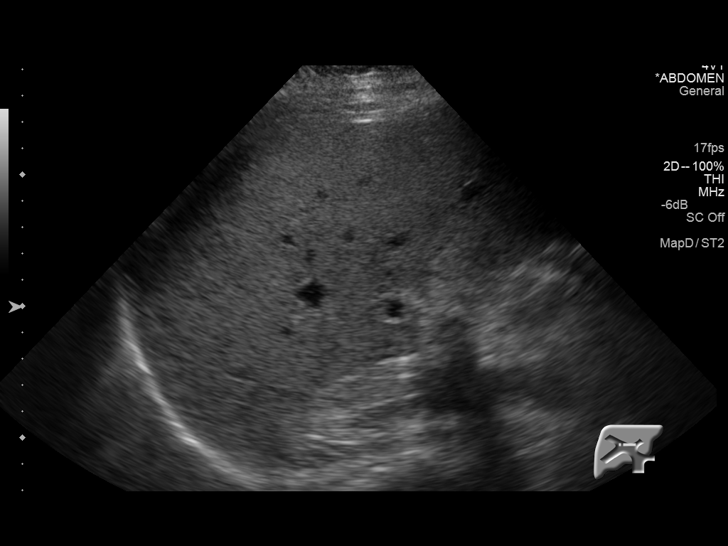
[im 64/64]
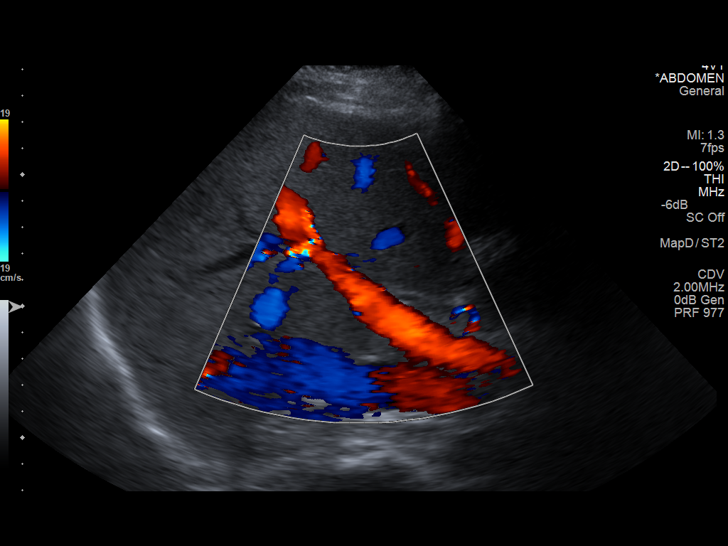

[14 of 25 positions shown; findings below may reference images not displayed]

FINDINGS: Gallbladder:

Multiple gallstones are noted with no significant gallbladder wall
thickening or pericholecystic fluid. No sonographic Murphy's sign is
noted. Largest stone measures 2 cm in the neck.

Common bile duct:

Diameter: 6 mm which is within normal limits.

Liver:

No focal lesion identified. Within normal limits in parenchymal
echogenicity.
IMPRESSION: Cholelithiasis is noted without evidence cholecystitis. No other
abnormality seen in the right upper quadrant of the abdomen.

## 2015-09-06 ENCOUNTER — Ambulatory Visit: Payer: Medicaid Other | Admitting: Family Medicine

## 2015-10-12 ENCOUNTER — Other Ambulatory Visit: Payer: Self-pay | Admitting: Family Medicine

## 2015-10-12 DIAGNOSIS — C541 Malignant neoplasm of endometrium: Secondary | ICD-10-CM

## 2015-10-12 MED ORDER — POTASSIUM CHLORIDE ER 10 MEQ PO TBCR: 20.0000 meq | EXTENDED_RELEASE_TABLET | Freq: Two times a day (BID) | ORAL | Status: DC

## 2015-10-12 NOTE — Telephone Encounter (Signed)
Dr. Caryl Bis is in a new office, please advise refill, Thanks Lavella Lemons, RN

## 2015-10-12 NOTE — Telephone Encounter (Signed)
Pt called and needs a refill on her Potassium called in to her NEW pharmacy. [facility] at [address]. jw

## 2015-10-12 NOTE — Telephone Encounter (Signed)
Pt needs a refill on potassium chloride (K-DUR) 10 MEQ tablet sent to [facility] at [address]. Please contact pt once sent. Thank you, Fonda Kinder, ASA

## 2015-10-13 NOTE — Telephone Encounter (Signed)
2nd request

## 2015-10-14 NOTE — Telephone Encounter (Signed)
Was refilled on 11/16. CGM MD

## 2015-10-17 ENCOUNTER — Telehealth: Payer: Self-pay | Admitting: Gynecologic Oncology

## 2015-10-17 NOTE — Telephone Encounter (Signed)
Office Visit:  GYN ONCOLOGY   CC: Endometrial cancer surveillance  HPI:49 y/o G2 P1 LNMP  06/2012.  Deborah [name] was referred to Dr. Hulan Fray who collected an endometrial biopsy and a endocervical biopsy. Both were positive for endometrial adenocarcinoma.  On 10/07/2012 she underwent robotic hysterectomy bilateral salpingo-oophorectomy. At the time of surgery multiple cystic lesions were noted throughout the entire pelvis that was suspicious for metastatic disease, as such lymph node dissection was not collected but these lesions were biopsied. Final pathology was notable for  1. Soft tissue, biopsy, right para-colic gutter - BENIGN PARA-COLIC GUTTER SOFT TISSUE WITH BENIGN CYST (4.0 CM). SEE COMMENT. - NEGATIVE FOR MALIGNANCY. 2. Uterus +/- tubes/ovaries, neoplastic - ENDOMETRIAL ADENOCARCINOMA, ENDOMETRIOID-TYPE, SEE COMMENT. - TUMOR INVADES LESS THAN 1/2 OF MYOMETRIAL THICKNESS.- NO LYMPHOVASCULAR INVASION IDENTIFIED. - TUMOR INVADES INTO LOWER UTERINE SEGMENT. - UTERINE ADENOMYOSIS. - BENIGN RIGHT AND LEFT OVARIES; NO ATYPIA OR MALIGNANCY PRESENT. - BENIGN RIGHT AND LEFT FALLOPIAN TUBES WITH BENIGN SEROUS-TYPE PARATUBAL CYSTS. - BENIGN CERVIX; NEGATIVE FOR INTRAEPITHELIAL LESION OR MALIGNANCY. - BENIGN MULTILOCULAR UTERINE SEROSAL CYST.  CT abdomen and pelvis 11/25/2012  IMPRESSION:  1. Shotty bilateral external iliac lymphadenopathy measuring up to 1.3 cm, suspicious for metastatic disease. Nonspecific less than 5 mm retroperitoneal lymph nodes also noted in the left para-aortic region. 2. 2.7 cm postop lymphocele versus low attenuation lymphadenopathy in the proximal left external iliac chain. 3. Cholelithiasis and right nephrolithiasis incidentally noted.   End of treatment CT 08/25/2013 notable for no evidence of abdominal lymphadenopathy. There Is no evidence of inflammatory process abscess or ascites the adnexal regions are unremarkable in appearance there is no new or progressive disease  within the abdomen or pelvis  Mammogram 01/2015 wnl Pap 05/2015  No dysplasia, BV   Past Surgical Hx:  Past Surgical History  Procedure Laterality Date  . Hiatal hernia repair    . Repair vaginal cuff  10/07/2012    Procedure: REPAIR VAGINAL CUFF;  Surgeon: [name], MD PHD;  Location: [facility];  Service: Gynecology;;  . Robotic assisted total hysterectomy with bilateral salpingo oopherectomy  10/07/2012    Procedure: ROBOTIC ASSISTED TOTAL HYSTERECTOMY WITH BILATERAL SALPINGO OOPHORECTOMY;  Surgeon: [name], MD PHD;  Location: [facility];  Service: Gynecology;  Laterality: Bilateral;  . Abdominal hysterectomy  10/2011    complete  . Cholecystectomy N/A 07/08/2015    Procedure: LAPAROSCOPIC CHOLECYSTECTOMY WITH INTRAOPERATIVE CHOLANGIOGRAM;  Surgeon: Judeth Horn, MD;  Location: [place];  Service: General;  Laterality: N/A;    Past Medical Hx:  Past Medical History  Diagnosis Date  . Congenital birth defect     Right hand  . Anemia   . Anxiety   . Swelling     ANKLES - TAKES LASIX  . Depression   . HLD (hyperlipidemia)     borderline  . Obesity   . Hx of radiation therapy 04/07/13- 05/14/13    pelvis 45 gray 25 fx  . Hx of echocardiogram     Echo (9/15): EF 50-55%, normal wall motion, grade 2 diastolic dysfunction, mild LAE  . Arthritis     ,Knees, back  . Endometrial cancer   . Hypertension     Past Gynecological History:  G2 P1 Menarche 83, Patient's last menstrual period was 09/20/2012. No h/o abn pap test  Family Hx:  Family History  Problem Relation Age of Onset  . Pancreatic cancer Maternal Grandfather     or liver cancer  . Hypertension Maternal Grandfather   . Cancer  Maternal Grandfather     pancreatic  . Hypertension Maternal Grandmother   . Diabetes Mother   . Colon polyps Maternal Aunt   . Cancer Maternal Aunt     lung  . Colon cancer Cousin   . Cancer Cousin     lung  . Lung cancer Maternal Aunt   . Heart attack Neg Hx   . Stroke Cousin     Mat aunt lung cancer Dx 99 (tob use) Mat GF pancreatic cancer Mat cousin lung cancer dx 36's (tob use)  Review of Systems:  Constitutional  Feels well, weight gain, no malaise. Cardiovascular  No chest pain, shortness of breath, or edema  Pulmonary  No cough or wheeze.  Gastro Intestinal  No nausea, vomitting, or diarrhoea. No rectal bleeding. Genito Urinary  No frequency, urgency, dysuria,no vaginal spotting or discharge.  Is compliant with the use of her vaginal dilator Musculo Skeletal  No myalgia, left knee arthralgia,  joint swelling and pain   Physical Exam: BP 119/82 mmHg  Pulse 71  Temp(Src) 98.3 F (36.8 C) (Oral)  Resp 18  Ht 5\' 6"  (1.676 m)  Wt 323 lb 4.8 oz (146.648 kg)  BMI 52.21 kg/m2  LMP 09/20/2012 Wt Readings from Last 3 Encounters:  07/08/15 322 lb (146.058 kg)  07/06/15 322 lb 4.8 oz (146.194 kg)  06/17/15 324 lb (146.965 kg)   WD in NAD CHEST:  CTA CARDIAC:  RRR ABDOMEN :  Soft nontender. Trocar sites without evidence of mass or hernia. Normoactive bowel sounds, abdomen soft, non-tender and morbidly obese.  BACK:  No CVAT LN:  No cervical supra clavicular or inguinal adenopathy PELVIC:  Nl EGBUS, Vaginal cuff intact.  No discharge, bleeding or tenderness or cul de sac nodularity Rectal:   Good tone no masses EXT:  Missing digits on the left hand.  No lower extremity edema or cyanosis.  Assessment/Plan:  Deborah [name]  is a 49 y.o.  year old with grade 2 endometrial adenocarcinoma noted on  the endometrial biopsy and endocervical curettage specimens. She underwent robotic total laparoscopic hysterectomy bilateral salpingo-oophorectomy. At the time of surgery diffuse miliary cystic-like lesions were noted in the cul-de-sac on on the rectosigmoid colon highly suspicious for metastatic disease. Because of these findings lymph node dissection was not performed. Final pathology was notable for grade 2 endometrioid endometrial adenocarcinoma with  12.5% myometrial invasion no lymphovascular space invasion no cervical involvement without confirmation of metastases in the eplic systs.    Received 6 cycles of Taxol/Carboplatin and pelvic XRT sandwich fashion.  Completed 07/24/2013.  F/U with Dr. Jaynie Crumble  01/2016 F/u with Gyn  04/2016 Follow-up with Gyn Onc 07/2016 Counseled on the signs and symptoms of recurrence

## 2015-10-26 ENCOUNTER — Other Ambulatory Visit: Payer: Self-pay | Admitting: Family Medicine

## 2015-10-26 NOTE — Telephone Encounter (Signed)
Pt called and needs a refill on her Prilosec call in to the  [facility] on [address] . jw

## 2015-10-27 MED ORDER — OMEPRAZOLE 20 MG PO CPDR: 20.0000 mg | DELAYED_RELEASE_CAPSULE | Freq: Two times a day (BID) | ORAL | Status: DC

## 2015-11-10 ENCOUNTER — Ambulatory Visit: Payer: Medicaid Other | Admitting: Gynecologic Oncology

## 2015-11-23 ENCOUNTER — Telehealth: Payer: Self-pay | Admitting: Radiation Oncology

## 2015-11-23 DIAGNOSIS — F4323 Adjustment disorder with mixed anxiety and depressed mood: Secondary | ICD-10-CM

## 2015-11-23 MED ORDER — DIAZEPAM 5 MG PO TABS: 5.0000 mg | ORAL_TABLET | Freq: Three times a day (TID) | ORAL | Status: DC | PRN

## 2015-11-23 NOTE — Telephone Encounter (Signed)
Per Dr. Johny Shears order called in diazepam 5 mg, one tablet every 8 hours prn, qty 30 and no refill to [facility]. Then, phoned patient making her aware this was done. Patient verbalized understanding and expressed appreciation for the call.

## 2015-11-25 ENCOUNTER — Other Ambulatory Visit: Payer: Self-pay | Admitting: Family Medicine

## 2015-11-25 NOTE — Telephone Encounter (Signed)
Refill request for Prilosec. 

## 2015-11-29 MED ORDER — OMEPRAZOLE 20 MG PO CPDR: 20.0000 mg | DELAYED_RELEASE_CAPSULE | Freq: Two times a day (BID) | ORAL | Status: DC

## 2015-11-29 NOTE — Telephone Encounter (Signed)
Pt is checking the status of her refill request on Prilosec. jw

## 2015-11-30 ENCOUNTER — Other Ambulatory Visit: Payer: Self-pay | Admitting: *Deleted

## 2015-11-30 MED ORDER — OMEPRAZOLE 20 MG PO CPDR: 20.0000 mg | DELAYED_RELEASE_CAPSULE | Freq: Two times a day (BID) | ORAL | Status: DC

## 2015-12-08 ENCOUNTER — Ambulatory Visit: Payer: Medicaid Other | Admitting: Radiation Oncology

## 2015-12-15 ENCOUNTER — Encounter: Payer: Self-pay | Admitting: Radiation Oncology

## 2015-12-15 ENCOUNTER — Other Ambulatory Visit: Payer: Self-pay | Admitting: Family Medicine

## 2015-12-15 ENCOUNTER — Ambulatory Visit
Admission: RE | Admit: 2015-12-15 | Discharge: 2015-12-15 | Disposition: A | Discharge status: Home / Self Care | Payer: Medicaid Other | Source: Ambulatory Visit | Attending: Radiation Oncology | Admitting: Radiation Oncology

## 2015-12-15 VITALS — BP 131/87 | HR 98 | Temp 98.5°F | Resp 12 | Wt 314.3 lb

## 2015-12-15 DIAGNOSIS — C541 Malignant neoplasm of endometrium: Secondary | ICD-10-CM

## 2015-12-15 NOTE — Telephone Encounter (Signed)
Pt needs a refill on potassium chloride (K-DUR) 10 MEQ tablet  Sent to [facility]. Pt would like to have this completed asap. Sadie Reynolds, ASA

## 2015-12-15 NOTE — Progress Notes (Signed)
Radiation Oncology         [phone] ________________________________  Name: Deborah [name] MRN: [id number]  Date: 12/15/2015  DOB: 1966-04-20  Follow-Up Visit Note  CC: Paula Compton, MD  Gordy Levan, MD    ICD-9-CM ICD-10-CM   1. Endometrial cancer (HCC) 182.0 C54.1     Diagnosis: Stage IIIC grade 2 endometrial adenocarcinoma    Interval Since Last Radiation: 2 years and 6 months.  May 13 through May 14, 2013: Pelvis 45 gray in 25 fractions  Narrative:  The patient returns today for routine follow-up. She saw Dr. Skeet Latch on 10/17/15. She denies pain, vaginal bleeding, discharge, itching, or odor. She denies abnormal urinary symptoms and experiences nocturia x 1-2. She has daily bowel movements.  ALLERGIES:  is allergic to lactose intolerance (gi).  Meds: Current Outpatient Prescriptions  Medication Sig Dispense Refill  . acetaminophen (TYLENOL) 500 MG tablet Take 1,000 mg by mouth every 6 (six) hours as needed for moderate pain (pain).    [name] aspirin 81 MG tablet Take 81 mg by mouth every morning.     . bifidobacterium infantis (ALIGN) capsule Take 1 capsule by mouth daily as needed (for regularity).     . carvedilol (COREG) 3.125 MG tablet Take 1 tablet in the am and 2 in the pm. 90 tablet 10  . diazepam (VALIUM) 5 MG tablet Take 1 tablet (5 mg total) by mouth every 8 (eight) hours as needed for anxiety. Disp. 30 tablets. 3 refills.  Called in to [facility]. 30 tablet 3  . furosemide (LASIX) 20 MG tablet Take 1 tablet (20 mg total) by mouth daily as needed for fluid. 30 tablet 0  . HYDROcodone-acetaminophen (NORCO/VICODIN) 5-325 MG per tablet Take 2 tablets by mouth every 4 (four) hours as needed. 10 tablet 0  . ibuprofen (ADVIL,MOTRIN) 800 MG tablet Take 1 tablet (800 mg total) by mouth every 8 (eight) hours as needed. (Patient taking differently: Take 800 mg by mouth every 8 (eight) hours as needed for moderate pain. ) 60 tablet 0  . lactase (LACTAID) 3000 UNITS  tablet Take 1 tablet by mouth 3 (three) times daily as needed (for dairy intake).     [name] loperamide (IMODIUM) 2 MG capsule Take 2 mg by mouth 4 (four) times daily as needed for diarrhea or loose stools (diarrhea).     . magnesium chloride (SLOW-MAG) 64 MG TBEC SR tablet Take 1-2 tablets by mouth 2 (two) times daily. Takes two in the morning and one in the evening    . mometasone (NASONEX) 50 MCG/ACT nasal spray Place 2 sprays into the nose daily. One spray in each nostril. 17 g 0  . omeprazole (PRILOSEC) 20 MG capsule Take 1 capsule (20 mg total) by mouth daily. 30 capsule 3  . omeprazole (PRILOSEC) 20 MG capsule Take 1 capsule (20 mg total) by mouth 2 (two) times daily. 30 capsule 0  . ondansetron (ZOFRAN ODT) 4 MG disintegrating tablet Take 1 tablet (4 mg total) by mouth every 8 (eight) hours as needed for nausea. 10 tablet 0  . oxyCODONE-acetaminophen (PERCOCET/ROXICET) 5-325 MG per tablet Take 1 tablet by mouth every 4 (four) hours as needed. 15 tablet 0  . oxyCODONE-acetaminophen (ROXICET) 5-325 MG per tablet Take 1-2 tablets by mouth every 4 (four) hours as needed for severe pain. 30 tablet 0  . polyethylene glycol powder (GLYCOLAX/MIRALAX) powder Take 17 g by mouth daily as needed. 3350 g 1  . potassium chloride (K-DUR) 10 MEQ tablet  Take 2 tablets (20 mEq total) by mouth 2 (two) times daily. 120 tablet 1  . potassium chloride (K-DUR) 10 MEQ tablet Take 2 tablets (20 mEq total) by mouth 2 (two) times daily. 120 tablet 1  . sertraline (ZOLOFT) 100 MG tablet Take 1 tablet (100 mg total) by mouth daily. take 1 tablet by mouth once daily (Patient taking differently: Take 100 mg by mouth every evening. ) 30 tablet 1   No current facility-administered medications for this encounter.    Physical Findings: The patient is in no acute distress. Patient is alert and oriented.  weight is 314 lb 4.8 oz (142.566 kg). Her oral temperature is 98.5 F (36.9 C). Her blood pressure is 131/87 and her pulse is  98. Her respiration is 12 and oxygen saturation is 100%.   No palpable supraclavicular or axillary adenopathy. The lungs are clear to auscultation. The heart has a regular rhythm and rate. The abdomen is soft and nontender with normal bowel sounds. No inguinal adenopathy appreciated. On pelvic examination the external genitalia are unremarkable. A speculum exam is performed. No mucosal lesions noted in the vaginal vault. . On bimanual examination there no obvious pelvic masses although exam is compromised in light of the patient's body habitus. The patient would not permit a rectal exam.  Lab Findings: Lab Results  Component Value Date   WBC 4.7 07/06/2015   HGB 13.2 07/06/2015   HCT 40.2 07/06/2015   MCV 89.9 07/06/2015   PLT 299 07/06/2015    Radiographic Findings: No results found.  Impression:  No evidence of recurrence on clinical exam today.  Plan: She will follow up with Dr. Skeet Latch in 3 months and myself in 6 months. ____________________________________  Blair Promise, PhD, MD   This document serves as a record of services personally performed by Gery Pray, MD. It was created on his behalf by Darcus Austin, a trained medical scribe. The creation of this record is based on the scribe's personal observations and the provider's statements to them. This document has been checked and approved by the attending provider.

## 2015-12-15 NOTE — Progress Notes (Signed)
PAIN: She is currently in no pain.  URINARY: Denies abnormal urinary symptoms.  Pt states they urinate 1 - 2 times per night.   VAGINAL: Pt denies vaginal bleeding, discharge, itching or odor.  BOWEL: Pt reports depends on what she eats, daily bowel movements.  BP 131/87 mmHg  Pulse 98  Temp(Src) 98.5 F (36.9 C) (Oral)  Resp 12  Wt 314 lb 4.8 oz (142.566 kg)  SpO2 100%  LMP 09/20/2012 Wt Readings from Last 3 Encounters:  12/15/15 314 lb 4.8 oz (142.566 kg)  07/08/15 322 lb (146.058 kg)  07/06/15 322 lb 4.8 oz (146.194 kg)

## 2015-12-16 MED ORDER — POTASSIUM CHLORIDE ER 10 MEQ PO TBCR: 20.0000 meq | EXTENDED_RELEASE_TABLET | Freq: Two times a day (BID) | ORAL | Status: DC

## 2015-12-16 NOTE — Telephone Encounter (Signed)
Pt called again about her RX. She has 2 for today and then will be out.

## 2015-12-21 ENCOUNTER — Ambulatory Visit: Payer: Medicaid Other | Admitting: Family Medicine

## 2015-12-29 ENCOUNTER — Telehealth: Payer: Self-pay | Admitting: Family Medicine

## 2015-12-29 MED ORDER — OMEPRAZOLE 20 MG PO CPDR: 20.0000 mg | DELAYED_RELEASE_CAPSULE | Freq: Two times a day (BID) | ORAL | Status: DC

## 2015-12-29 NOTE — Telephone Encounter (Signed)
Refilled. CGM MD 

## 2015-12-29 NOTE — Telephone Encounter (Signed)
Pt called and will need a refill on her Prilosec called in. She takes 2 tablets a day but last time this was called in it was for 30 tablets so can we change this to 60 Qty. jw

## 2016-01-05 ENCOUNTER — Ambulatory Visit (INDEPENDENT_AMBULATORY_CARE_PROVIDER_SITE_OTHER): Payer: Medicaid Other | Admitting: Family Medicine

## 2016-01-05 ENCOUNTER — Encounter: Payer: Self-pay | Admitting: Family Medicine

## 2016-01-05 VITALS — BP 100/68 | HR 94 | Temp 98.1°F | Ht 66.0 in | Wt 310.8 lb

## 2016-01-05 DIAGNOSIS — D509 Iron deficiency anemia, unspecified: Secondary | ICD-10-CM | POA: Diagnosis not present

## 2016-01-05 DIAGNOSIS — G44229 Chronic tension-type headache, not intractable: Secondary | ICD-10-CM | POA: Diagnosis not present

## 2016-01-05 DIAGNOSIS — R1032 Left lower quadrant pain: Secondary | ICD-10-CM

## 2016-01-05 DIAGNOSIS — C541 Malignant neoplasm of endometrium: Secondary | ICD-10-CM | POA: Diagnosis not present

## 2016-01-05 DIAGNOSIS — R790 Abnormal level of blood mineral: Secondary | ICD-10-CM

## 2016-01-05 DIAGNOSIS — E876 Hypokalemia: Secondary | ICD-10-CM | POA: Diagnosis not present

## 2016-01-05 LAB — CBC
HCT: 42.8 % (ref 36.0–46.0)
Hemoglobin: 14.1 g/dL (ref 12.0–15.0)
MCH: 28.8 pg (ref 26.0–34.0)
MCHC: 32.9 g/dL (ref 30.0–36.0)
MCV: 87.3 fL (ref 78.0–100.0)
MPV: 9.5 fL (ref 8.6–12.4)
PLATELETS: 267 10*3/uL (ref 150–400)
RBC: 4.9 MIL/uL (ref 3.87–5.11)
RDW: 14.7 % (ref 11.5–15.5)
WBC: 5.4 10*3/uL (ref 4.0–10.5)

## 2016-01-05 MED ORDER — MOMETASONE FUROATE 50 MCG/ACT NA SUSP: 2.0000 | Freq: Every day | NASAL | Status: DC

## 2016-01-05 MED ORDER — POTASSIUM CHLORIDE ER 10 MEQ PO TBCR: 20.0000 meq | EXTENDED_RELEASE_TABLET | Freq: Two times a day (BID) | ORAL | Status: DC

## 2016-01-05 NOTE — Progress Notes (Signed)
Patient ID: Deborah [name], female   DOB: [birth year], 50 y.o.   MRN: [id number]   Subjective:  This history was provided by the patient.  Deborah [name] is a 50 y.o. female who  has a past medical history of Congenital birth defect; Anemia; Anxiety; Swelling; Depression; HLD (hyperlipidemia); Obesity; radiation therapy (04/07/13- 05/14/13); echocardiogram; Arthritis; Hypertension; and Endometrial cancer ([place]).Patient has also had a complete hysterectomy secondary to endometrial cancer and a cholecystectomy.  Patient also underwent chemotherapy for cancer treatment, but has completed that course. She is still followed by gyn/onc.  Patient presents to clinic to establish care with a new physician.  She describes herself as a "healthy person," and denies specific complaints except for occasional headache that has been intermittent for several years without neurological sequelae, no vision changes, nausea, peripheral numbness weakness, lack of coordination, no change in appetite, no weight loss, occurs 3 x monthly and doesn't last more than a few hours. Controlled with tylenol. and occasional LUQ and periumbilical pain since her cholecystectomy in August 2016.  She has not followed up with surgeon regarding this pain because it is not constant and not associated with N/V or fever. She denies melena, weight loss, constipation or diarrhea, feels the pain when she is laying down in bed mostly at night. Does not feel like it is "gas" pain. No appetite changes. No history of colon cancer in her family.   Patient denies N/V/D, constipation, blood in stool, vaginal discharge and bleeding.  She also denies fever, cough, hemoptysis, loss of appetite and unexplained weight loss.  Additionally, patient denies chronic headache, chest pain, lower extremity edema, dizziness, changes in vision, weakness, shortness of breath or wheezing.  Review of Systems:  Per HPI. All other systems reviewed and are negative.   PMH, PSH,  Medications, Allergies, and FmHx reviewed and updated in EMR.  Social History: never smoker  Objective:  BP 100/68 mmHg  Pulse 94  Temp(Src) 98.1 F (36.7 C) (Oral)  Ht 5\' 6"  (1.676 m)  Wt 310 lb 12.8 oz (140.978 kg)  BMI 50.19 kg/m2  LMP 09/20/2012  General: 50 y.o. female Awake, alert, obese, NAD and non-toxic in appearance HEENT: Normal    Neck: No masses palpated. No LAD    Eyes: Sclera white with no injection.  No drainage noted.    Nose: nasal turbinates moist    Throat: MMM, no erythema Cardio: RRR, S1S2 heard, no murmurs appreciated, no LE edema noted.  No cyanosis or clubbing; +2 radial pulses bilaterally Pulm: Clear to auscultation bilaterally, no wheezes, rhonchi or rales GI: Soft, not tender, no distension,+BS x4 Extremities: MAE MSK: Normal gait and station, no arthralgias. Neuro: Strength and sensation grossly intact  Assessment & Plan:    Deborah [name] is a 50 y.o. female here for annual checkup and to meet new physician.  Despite her medical history, patient describes herself as generally healthy.  Aside from 2 years of occasional headache and LUQ pain since her cholecystectomy in August of 2016, patient has no complaints.  Patient requests refills on her Nasonex and K-dur and labs to check Hgb, potassium and magnesium.  She is still closely followed by gyn/onc.  1. Hypokalemia - history - Basic Metabolic Panel - CBC - potassium chloride (K-DUR) 10 MEQ tablet; Take 2 tablets (20 mEq total) by mouth 2 (two) times daily.    2. Low magnesium levels - history - Basic Metabolic Panel - CBC - Magnesium  3. Anemia, iron deficiency - history -  Basic Metabolic Panel - CBC  4. Chronic tension-type headache, not intractable - x2 years Continue Tylenol prn for headache.  No red flag symptoms at this time.   5. Left lower quadrant pain  - x6 months since cholecystectomy Unclear etiology Possibly related to surgery in the past.  No red flag symptoms for cancer.  Will continue to monitor.   6. Endometrial cancer ([place]) - history  Followed by GYN ONC 2 years out from [place] / Radiation Continues to follow with them.   Sherron Ales, NP [facility] 01/05/2016 4:07 PM   I agree with the above evaluation, assessment, and plan. Any correctional changes can be noted in [facility].   Aquilla Hacker, MD Family Medicine Resident - PGY 2

## 2016-01-05 NOTE — Patient Instructions (Signed)
Thanks for coming in today.   I'm not exactly sure what is causing your abdominal pain, it may be related to your surgery. We will keep an eye on it. If you experience weight loss, change in appetite, constipation or ongoing diarrhea, blood in your stool, or any other concern, then we will investigate further.   Continue to use tylenol for your headaches.   We will check lab tests today for screening.   Continue your current medications.   Return for follow up in 6 months.   Thanks for letting us take care of you.   Sincerely,  Paula Compton, MD Family Medicine - PGY 2

## 2016-01-06 LAB — BASIC METABOLIC PANEL
BUN: 16 mg/dL (ref 7–25)
CHLORIDE: 100 mmol/L (ref 98–110)
CO2: 25 mmol/L (ref 20–31)
CREATININE: 0.78 mg/dL (ref 0.50–1.10)
Calcium: 9.2 mg/dL (ref 8.6–10.2)
GLUCOSE: 82 mg/dL (ref 65–99)
Potassium: 3.8 mmol/L (ref 3.5–5.3)
Sodium: 139 mmol/L (ref 135–146)

## 2016-01-06 LAB — MAGNESIUM: Magnesium: 1.2 mg/dL — ABNORMAL LOW (ref 1.5–2.5)

## 2016-01-09 ENCOUNTER — Telehealth: Payer: Self-pay | Admitting: Family Medicine

## 2016-01-09 ENCOUNTER — Encounter: Payer: Self-pay | Admitting: Family Medicine

## 2016-01-09 NOTE — Telephone Encounter (Signed)
Called to inform of normal results except for mildly low magnesium. Continue magnesium supplementation. She requests hard copy of lab results. Thanks  CGM MD

## 2016-01-13 ENCOUNTER — Other Ambulatory Visit: Payer: Self-pay

## 2016-01-13 DIAGNOSIS — Z1231 Encounter for screening mammogram for malignant neoplasm of breast: Secondary | ICD-10-CM

## 2016-02-09 ENCOUNTER — Ambulatory Visit: Payer: Medicaid Other

## 2016-02-23 ENCOUNTER — Ambulatory Visit: Payer: Medicaid Other | Attending: Gynecologic Oncology | Admitting: Gynecologic Oncology

## 2016-02-23 ENCOUNTER — Encounter: Payer: Self-pay | Admitting: Gynecologic Oncology

## 2016-02-23 VITALS — BP 125/75 | HR 89 | Temp 98.0°F | Resp 18 | Ht 66.0 in | Wt 315.0 lb

## 2016-02-23 DIAGNOSIS — Z90710 Acquired absence of both cervix and uterus: Secondary | ICD-10-CM | POA: Insufficient documentation

## 2016-02-23 DIAGNOSIS — M25473 Effusion, unspecified ankle: Secondary | ICD-10-CM | POA: Diagnosis not present

## 2016-02-23 DIAGNOSIS — E785 Hyperlipidemia, unspecified: Secondary | ICD-10-CM | POA: Diagnosis not present

## 2016-02-23 DIAGNOSIS — Z08 Encounter for follow-up examination after completed treatment for malignant neoplasm: Secondary | ICD-10-CM | POA: Insufficient documentation

## 2016-02-23 DIAGNOSIS — Z923 Personal history of irradiation: Secondary | ICD-10-CM | POA: Diagnosis not present

## 2016-02-23 DIAGNOSIS — F419 Anxiety disorder, unspecified: Secondary | ICD-10-CM | POA: Diagnosis not present

## 2016-02-23 DIAGNOSIS — D649 Anemia, unspecified: Secondary | ICD-10-CM | POA: Insufficient documentation

## 2016-02-23 DIAGNOSIS — K8020 Calculus of gallbladder without cholecystitis without obstruction: Secondary | ICD-10-CM | POA: Insufficient documentation

## 2016-02-23 DIAGNOSIS — E669 Obesity, unspecified: Secondary | ICD-10-CM | POA: Diagnosis not present

## 2016-02-23 DIAGNOSIS — M1990 Unspecified osteoarthritis, unspecified site: Secondary | ICD-10-CM | POA: Insufficient documentation

## 2016-02-23 DIAGNOSIS — F329 Major depressive disorder, single episode, unspecified: Secondary | ICD-10-CM | POA: Insufficient documentation

## 2016-02-23 DIAGNOSIS — R590 Localized enlarged lymph nodes: Secondary | ICD-10-CM | POA: Insufficient documentation

## 2016-02-23 DIAGNOSIS — R1084 Generalized abdominal pain: Secondary | ICD-10-CM | POA: Insufficient documentation

## 2016-02-23 DIAGNOSIS — I10 Essential (primary) hypertension: Secondary | ICD-10-CM | POA: Diagnosis not present

## 2016-02-23 DIAGNOSIS — N200 Calculus of kidney: Secondary | ICD-10-CM | POA: Insufficient documentation

## 2016-02-23 DIAGNOSIS — C541 Malignant neoplasm of endometrium: Secondary | ICD-10-CM

## 2016-02-23 DIAGNOSIS — Z8542 Personal history of malignant neoplasm of other parts of uterus: Secondary | ICD-10-CM | POA: Diagnosis present

## 2016-02-23 NOTE — Progress Notes (Signed)
Office Visit:  GYN ONCOLOGY   CC: Endometrial cancer surveillance  HPI: 50 y.o.  G2 P1 LNMP  06/2012.  Deborah [name] was referred to Dr. Hulan Fray who collected an endometrial biopsy and a endocervical biopsy. Both were positive for endometrial adenocarcinoma.  On 10/07/2012 she underwent robotic hysterectomy bilateral salpingo-oophorectomy. At the time of surgery multiple cystic lesions were noted throughout the entire pelvis that was suspicious for metastatic disease, as such lymph node dissection was not collected but these lesions were biopsied. Final pathology was notable for  1. Soft tissue, biopsy, right para-colic gutter - BENIGN PARA-COLIC GUTTER SOFT TISSUE WITH BENIGN CYST (4.0 CM). SEE COMMENT. - NEGATIVE FOR MALIGNANCY. 2. Uterus +/- tubes/ovaries, neoplastic - ENDOMETRIAL ADENOCARCINOMA, ENDOMETRIOID-TYPE, SEE COMMENT. - TUMOR INVADES LESS THAN 1/2 OF MYOMETRIAL THICKNESS.- NO LYMPHOVASCULAR INVASION IDENTIFIED. - TUMOR INVADES INTO LOWER UTERINE SEGMENT. - UTERINE ADENOMYOSIS. - BENIGN RIGHT AND LEFT OVARIES; NO ATYPIA OR MALIGNANCY PRESENT. - BENIGN RIGHT AND LEFT FALLOPIAN TUBES WITH BENIGN SEROUS-TYPE PARATUBAL CYSTS. - BENIGN CERVIX; NEGATIVE FOR INTRAEPITHELIAL LESION OR MALIGNANCY. - BENIGN MULTILOCULAR UTERINE SEROSAL CYST.  CT abdomen and pelvis 11/25/2012  IMPRESSION:  1. Shotty bilateral external iliac lymphadenopathy measuring up to 1.3 cm, suspicious for metastatic disease. Nonspecific less than 5 mm retroperitoneal lymph nodes also noted in the left para-aortic region. 2. 2.7 cm postop lymphocele versus low attenuation lymphadenopathy in the proximal left external iliac chain. 3. Cholelithiasis and right nephrolithiasis incidentally noted.   End of treatment CT 08/25/2013 notable for no evidence of abdominal lymphadenopathy. There Is no evidence of inflammatory process abscess or ascites the adnexal regions are unremarkable in appearance there is no new or progressive disease  within the abdomen or pelvis  Mammogram 01/2015 wnl Pap 05/2015  No dysplasia, BV   Past Surgical Hx:  Past Surgical History  Procedure Laterality Date  . Hiatal hernia repair    . Repair vaginal cuff  10/07/2012    Procedure: REPAIR VAGINAL CUFF;  Surgeon: [name], MD PHD;  Location: [facility];  Service: Gynecology;;  . Robotic assisted total hysterectomy with bilateral salpingo oopherectomy  10/07/2012    Procedure: ROBOTIC ASSISTED TOTAL HYSTERECTOMY WITH BILATERAL SALPINGO OOPHORECTOMY;  Surgeon: [name], MD PHD;  Location: [facility];  Service: Gynecology;  Laterality: Bilateral;  . Abdominal hysterectomy  10/2011    complete  . Cholecystectomy N/A 07/08/2015    Procedure: LAPAROSCOPIC CHOLECYSTECTOMY WITH INTRAOPERATIVE CHOLANGIOGRAM;  Surgeon: Judeth Horn, MD;  Location: [place];  Service: General;  Laterality: N/A;    Past Medical Hx:  Past Medical History  Diagnosis Date  . Congenital birth defect     Right hand  . Anemia   . Anxiety   . Swelling     ANKLES - TAKES LASIX  . Depression   . HLD (hyperlipidemia)     borderline  . Obesity   . Hx of radiation therapy 04/07/13- 05/14/13    pelvis 45 gray 25 fx  . Hx of echocardiogram     Echo (9/15): EF 50-55%, normal wall motion, grade 2 diastolic dysfunction, mild LAE  . Arthritis     ,Knees, back  . Hypertension   . Endometrial cancer [facility])     Past Gynecological History:  G2 P1 Menarche 52, Patient's last menstrual period was 09/20/2012. No h/o abn pap test  Family Hx:  Family History  Problem Relation Age of Onset  . Pancreatic cancer Maternal Grandfather     or liver cancer  . Hypertension Maternal Grandfather   .  Cancer Maternal Grandfather     pancreatic  . Hypertension Maternal Grandmother   . Diabetes Mother   . Colon polyps Maternal Aunt   . Cancer Maternal Aunt     lung  . Colon cancer Cousin   . Cancer Cousin     lung  . Lung cancer Maternal Aunt   . Heart attack Neg Hx   . Stroke Cousin     Mat aunt lung cancer Dx 4 (tob use) Mat GF pancreatic cancer Mat cousin lung cancer dx 64's (tob use)  Review of Systems:  Constitutional  Feels well, weight gain, no malaise. Cardiovascular  No chest pain, shortness of breath, or edema  Pulmonary  No cough or wheeze.  Gastro Intestinal  No nausea, vomitting, or diarrhoea. No rectal bleeding. Genito Urinary  No frequency, urgency, dysuria,no vaginal spotting or discharge.  Is compliant with the use of her vaginal dilator Musculo Skeletal  No myalgia, left knee arthralgia,  joint swelling and pain   Physical Exam: BP 125/75 mmHg  Pulse 89  Temp(Src) 98 F (36.7 C) (Oral)  Resp 18  Ht 5\' 6"  (1.676 m)  Wt 315 lb (142.883 kg)  BMI 50.87 kg/m2  SpO2 98%  LMP 09/20/2012 Wt Readings from Last 3 Encounters:  02/23/16 315 lb (142.883 kg)  01/05/16 310 lb 12.8 oz (140.978 kg)  12/15/15 314 lb 4.8 oz (142.566 kg)  WD in NAD CHEST:  CTA CARDIAC:  RRR ABDOMEN :  Soft nontender. Trocar sites without evidence of mass or hernia. Periumbilical incision consistent with recent cholecystectomy. Normoactive bowel sounds, abdomen soft, non-tender and morbidly obese.  BACK:  No CVAT LN:  No cervical supra clavicular or inguinal adenopathy PELVIC:  Nl EGBUS, Vaginal cuff intact.  No discharge, bleeding or tenderness or cul de sac nodularity. Rectal:   Good tone no masses EXT:  Missing digits on the left hand.  No lower extremity edema or cyanosis.  Assessment/Plan:  Deborah [name]  is a 50 y.o.  year old with grade 2 endometrial adenocarcinoma noted on  the endometrial biopsy and endocervical curettage specimens. She underwent robotic total laparoscopic hysterectomy bilateral salpingo-oophorectomy. At the time of surgery diffuse miliary cystic-like lesions were noted in the cul-de-sac on on the rectosigmoid colon highly suspicious for metastatic disease. Because of these findings lymph node dissection was not performed. Final  pathology was notable for grade 2 endometrioid endometrial adenocarcinoma with 12.5% myometrial invasion no lymphovascular space invasion no cervical involvement without confirmation of metastases in the eplic systs.    Received 6 cycles of Taxol/Carboplatin and pelvic XRT sandwich fashion.  Completed 07/24/2013.  F/U with Dr. Jaynie Crumble  04/2016 F/u with Gyn  07/2016 Ms. Dunnaway was counseled that follow-up with Dr. Hulan Fray with will begin in March 2017 Counseled on the signs and symptoms of recurrence  Obesity Weight loss occurred with the episode of gangrenous cholecystitis. Ms. Excell has a history of rapid weight loss and weight gain we discussed measures to minimum stabilize her weight.

## 2016-02-23 NOTE — Patient Instructions (Addendum)
  F/U with Dr. Sondra Come  04/2016 F/u with Gyn  07/2016.  Please call in one month to schedule your appointment with Dr. Skeet Latch for September or after you see Dr. Sondra Come. Counseled on the signs and symptoms of recurrence    Thank you very much Ms. Mickel [name] for allowing me to provide care for you today.  I appreciate your confidence in choosing our Gynecologic Oncology team.  If you have any questions about your visit today please call our office and we will get back to you as soon as possible.  Please consider using the website Medlineplus.gov as an Geneticist, molecular.   Francetta Found. Caral Whan MD., PhD Gynecologic Oncology

## 2016-03-14 ENCOUNTER — Ambulatory Visit
Admission: RE | Admit: 2016-03-14 | Discharge: 2016-03-14 | Disposition: A | Discharge status: Home / Self Care | Payer: Medicaid Other | Source: Ambulatory Visit

## 2016-03-14 DIAGNOSIS — Z1231 Encounter for screening mammogram for malignant neoplasm of breast: Secondary | ICD-10-CM

## 2016-03-21 ENCOUNTER — Other Ambulatory Visit: Payer: Self-pay | Admitting: Family Medicine

## 2016-03-21 ENCOUNTER — Telehealth: Payer: Self-pay | Admitting: Oncology

## 2016-03-21 ENCOUNTER — Other Ambulatory Visit: Payer: Self-pay | Admitting: Radiation Oncology

## 2016-03-21 NOTE — Telephone Encounter (Signed)
Called in refill per Dr. Sondra Come for diazepam (VALIUM) 5 MG tablet - Take 5 mg by mouth every 8 (eight) hours as needed for anxiety. Disp. 30 tablets. 3 refills. Called [facility] and left a message letting her know it has been called in.

## 2016-03-21 NOTE — Telephone Encounter (Signed)
Pt is calling and she needs a refill on her Furosemide called in to the [facility] on [address]. jw

## 2016-03-21 NOTE — Telephone Encounter (Signed)
Deborah [name] called and asked for 1-2 refills on diazepam.  She said she uses the [facility].  It was last filled on 11/23/15 with 3 refills.  She has a follow up appointment with Dr. Sondra Come on 06/14/16.

## 2016-03-22 ENCOUNTER — Encounter: Payer: Self-pay | Admitting: *Deleted

## 2016-03-22 ENCOUNTER — Other Ambulatory Visit: Payer: Self-pay | Admitting: *Deleted

## 2016-03-22 MED ORDER — FUROSEMIDE 20 MG PO TABS: 20.0000 mg | ORAL_TABLET | Freq: Every day | ORAL | Status: DC | PRN

## 2016-03-22 NOTE — Telephone Encounter (Signed)
Lasix refilled.  Archie Patten, MD [facility] Family Medicine Resident  03/22/2016, 8:23 AM

## 2016-03-22 NOTE — Progress Notes (Signed)
The inbasket message showed that a script for Valium for Deborah [name] was denied by [facility].     Reconfirmed that a prescription was filled per Dr. Clabe Seal order for Valium on 03/21/16 as follows: Valium 5 mg by mouth every 8 (eight) hours as needed for anxiety. Disp. 30 tablets. 3 refills [facility] - [place], [place].

## 2016-03-22 NOTE — Telephone Encounter (Deleted)
The inbasket message showed that a script for Valium for Deborah [name] was denied by her [facility].     Reconfirmed that a prescription was filled per Dr. Clabe Seal order for Valium on 03/21/16 as follows: Valium 5 mg by mouth every 8 (eight) hours as needed for anxiety. Disp. 30 tablets. 3 refills [facility] - [place], .

## 2016-05-31 NOTE — Telephone Encounter (Signed)
03/22/2016 6:24 PM    Progress Notes    Expand All Collapse All   The inbasket message showed that a script for Valium for Deborah [name] was denied by [facility].   Reconfirmed that a prescription was filled per Dr. Clabe Seal order for Valium on 03/21/16 as follows: Valium 5 mg by mouth every 8 (eight) hours as needed for anxiety. Disp. 30 tablets. 3 refills [facility] - [place], [place].

## 2016-06-14 ENCOUNTER — Ambulatory Visit: Payer: Medicaid Other | Admitting: Radiation Oncology

## 2016-06-18 ENCOUNTER — Telehealth: Payer: Self-pay | Admitting: Oncology

## 2016-06-18 ENCOUNTER — Ambulatory Visit: Admission: RE | Admit: 2016-06-18 | Payer: Medicaid Other | Source: Ambulatory Visit | Admitting: Radiation Oncology

## 2016-06-18 NOTE — Telephone Encounter (Signed)
Left a message regarding Deborah [name]'s appointment with Dr. Sondra Come today.  Requested a return call.

## 2016-06-18 NOTE — Telephone Encounter (Signed)
Deborah [name] called back and rescheduled her appointment for 07/12/16.  She said she did not have a ride today and apologized for missing the appointment.

## 2016-06-30 IMAGING — CR DG CHEST 2V
2 series · 2 of 2 positions shown · non-contrast
Comparison: None.

CLINICAL DATA: Endometrial cancer.

EXAM:
CHEST  2 VIEW

[w chest pa]
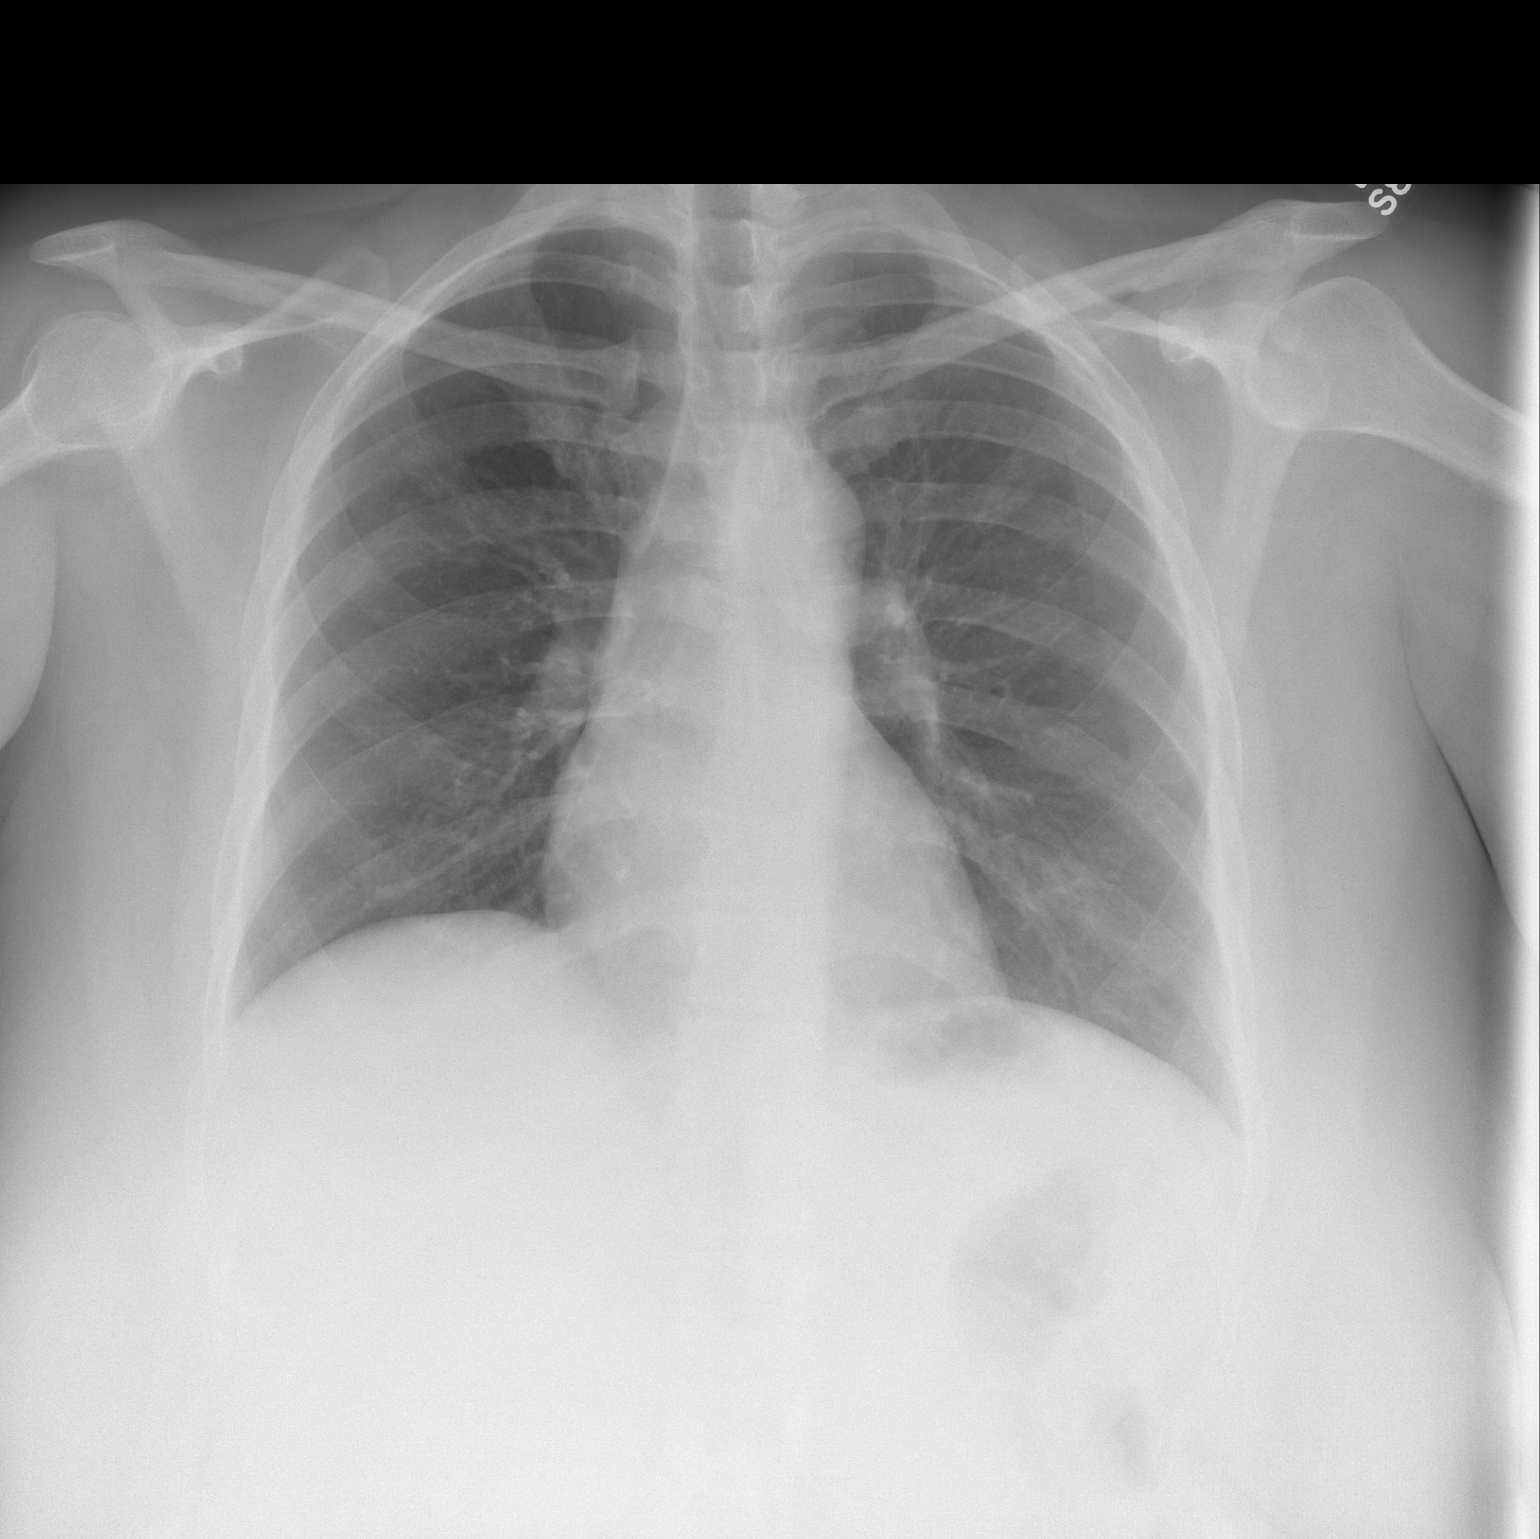

[w chest lat]
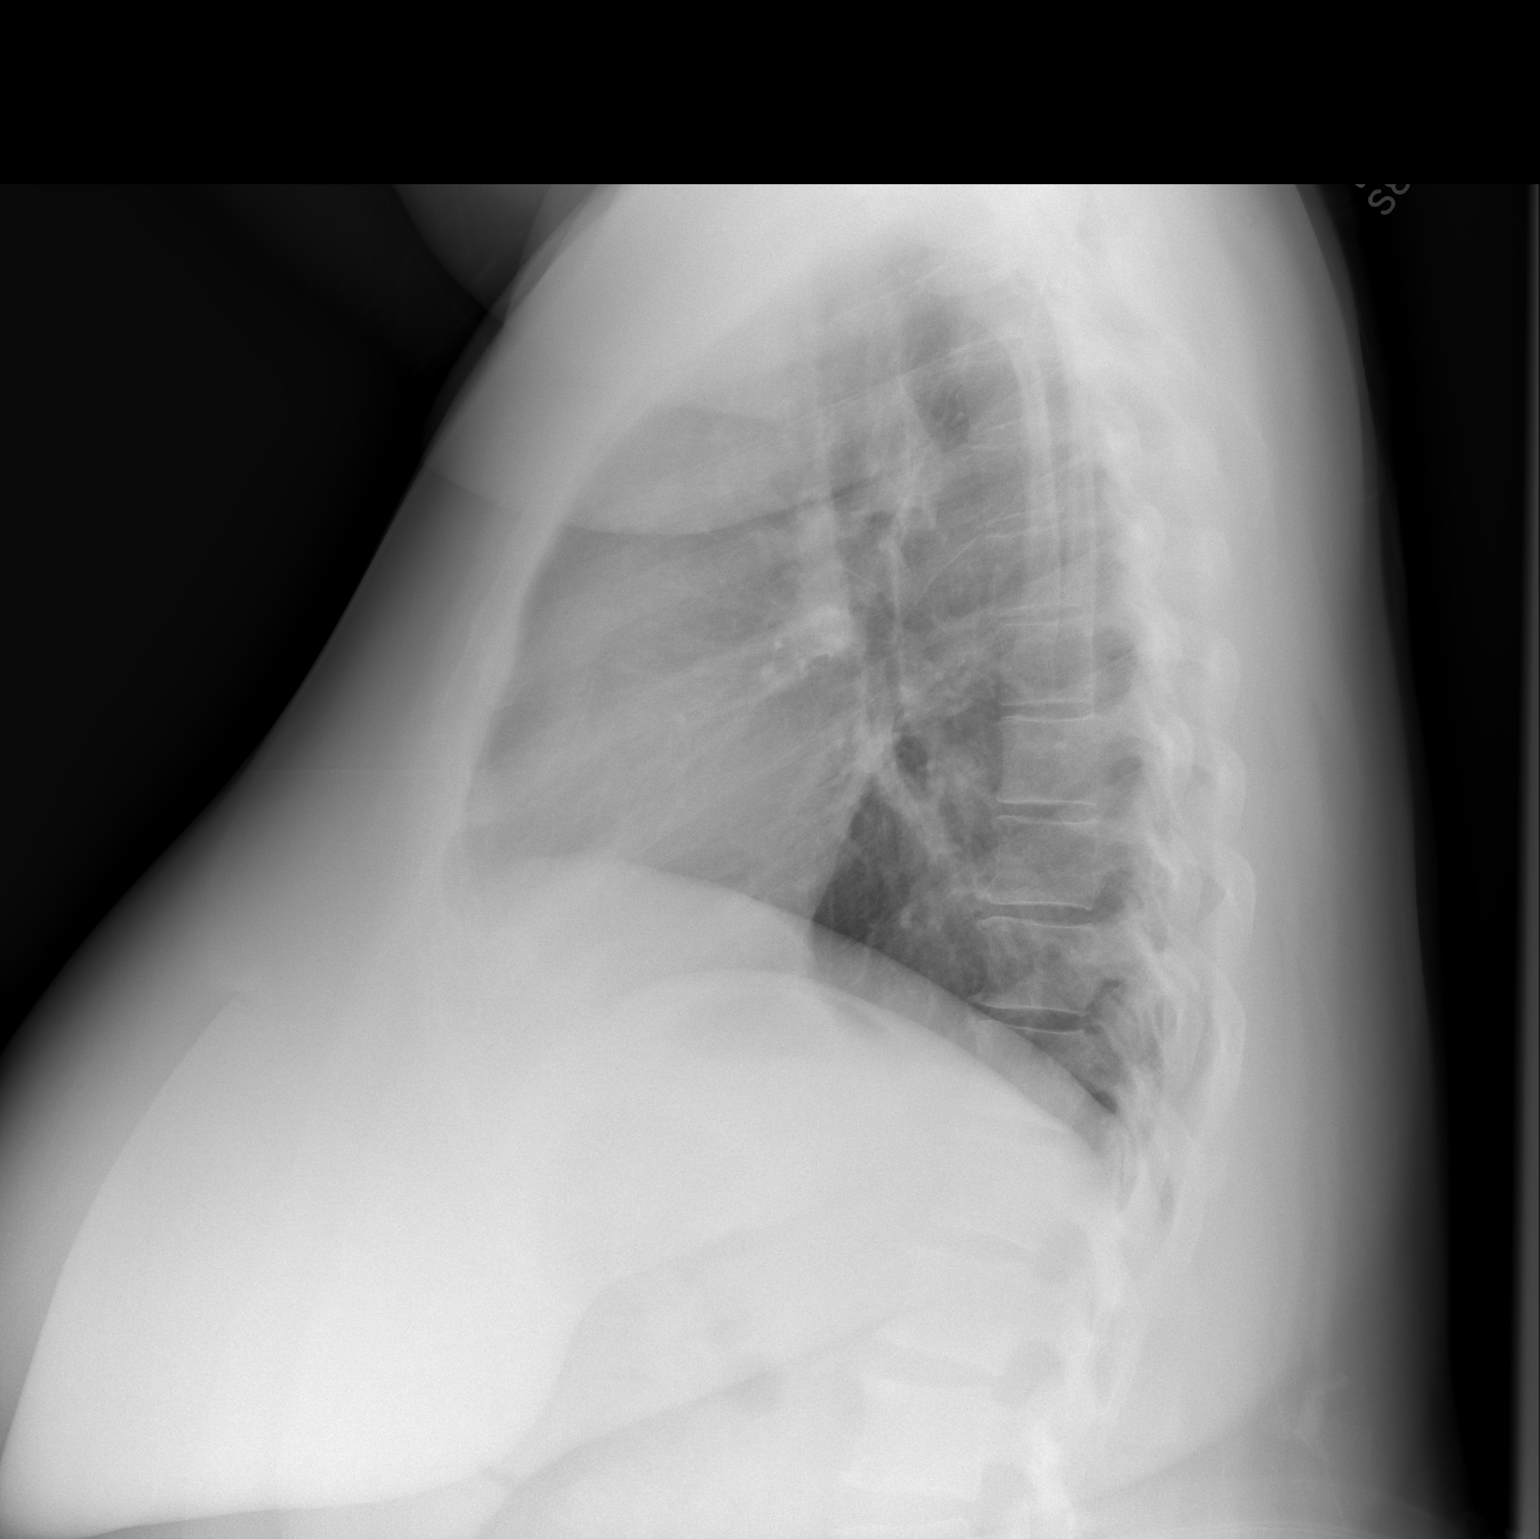

[2 of 2 positions shown; findings below may reference images not displayed]

FINDINGS: Mediastinum and hilar structures are normal. Lungs are clear. Heart
size normal. No acute bony abnormality.
IMPRESSION: No active cardiopulmonary disease.

## 2016-07-12 ENCOUNTER — Ambulatory Visit: Payer: Self-pay | Admitting: Radiation Oncology

## 2016-07-18 ENCOUNTER — Telehealth: Payer: Self-pay | Admitting: Oncology

## 2016-07-18 NOTE — Telephone Encounter (Signed)
Deborah [name] called and said she had to reschedule her appointment tomorrow because she had a passing in her family.  She has rescheduled for 08/23/16.  She is wondering if she can get a refill on diazepam sent in to [facility] on [address].

## 2016-07-18 NOTE — Telephone Encounter (Addendum)
Called in refill per Dr. Sondra Come to [facility] for diazepam 5 mg tablets - Take 5 mg by mouth every 8 (eight) hours as needed for anxiety. Disp. 30 tablets. 3 refills. Left a message for Sivani advising her that the refill has been called in.

## 2016-07-19 ENCOUNTER — Ambulatory Visit: Payer: Medicaid Other | Admitting: Radiation Oncology

## 2016-08-01 ENCOUNTER — Other Ambulatory Visit: Payer: Self-pay | Admitting: Cardiology

## 2016-08-01 NOTE — Telephone Encounter (Signed)
Patient calling to see when she is due to follow up with Dr. Percival Spanish and if it is possible to send in her Carvedilol.  I informed the patient her last appointment and Dr. Rosezella Florida recommendation of follow up (Last OV 01/2015 with 18 mth f/u).  Patient stated she did not get a call back letter and does not want to schedule a follow up at this time.  She asked how many refills can she receive without making an appointment. I informed her we can refill for 30 days without refills until she schedules an appointment.   She asked if the appointment is further than 30 days does she receive another refill.  I informed patient we will then and only then refill up to the appointment day.  She said she will call back to schedule at a later time. She did not know the main number directs her to different areas.

## 2016-08-02 ENCOUNTER — Ambulatory Visit: Payer: Medicaid Other | Admitting: Radiation Oncology

## 2016-08-17 ENCOUNTER — Telehealth: Payer: Self-pay | Admitting: *Deleted

## 2016-08-17 NOTE — Telephone Encounter (Signed)
CALLED PATIENT TO ALTER FU ON 08-23-16 DUE TO DR. KINARD DOING AN HDR CAPRI CASE, MOVED FU APPT. TO 08-20-16 @ 9 AM, LVM FOR A RETURN CALL

## 2016-08-20 ENCOUNTER — Ambulatory Visit: Payer: Medicaid Other | Admitting: Radiation Oncology

## 2016-08-20 ENCOUNTER — Telehealth: Payer: Self-pay | Admitting: *Deleted

## 2016-08-20 NOTE — Telephone Encounter (Signed)
CALLED PATIENT TO ASK QUESTION, LVM FOR A RETURN CALL 

## 2016-08-23 ENCOUNTER — Ambulatory Visit: Payer: Medicaid Other | Admitting: Radiation Oncology

## 2016-09-04 ENCOUNTER — Other Ambulatory Visit: Payer: Self-pay | Admitting: Cardiology

## 2016-09-06 ENCOUNTER — Ambulatory Visit
Admission: RE | Admit: 2016-09-06 | Discharge: 2016-09-06 | Disposition: A | Discharge status: Home / Self Care | Payer: Medicaid Other | Source: Ambulatory Visit | Attending: Radiation Oncology | Admitting: Radiation Oncology

## 2016-09-06 ENCOUNTER — Encounter: Payer: Self-pay | Admitting: Radiation Oncology

## 2016-09-06 VITALS — BP 122/80 | HR 91 | Temp 98.7°F | Ht 66.0 in | Wt 311.6 lb

## 2016-09-06 DIAGNOSIS — Z91011 Allergy to milk products: Secondary | ICD-10-CM | POA: Insufficient documentation

## 2016-09-06 DIAGNOSIS — C541 Malignant neoplasm of endometrium: Secondary | ICD-10-CM | POA: Insufficient documentation

## 2016-09-06 DIAGNOSIS — Z7982 Long term (current) use of aspirin: Secondary | ICD-10-CM | POA: Diagnosis not present

## 2016-09-06 DIAGNOSIS — Z923 Personal history of irradiation: Secondary | ICD-10-CM | POA: Diagnosis not present

## 2016-09-06 NOTE — Progress Notes (Signed)
Radiation Oncology         [phone] ________________________________  Name: Deborah [name] MRN: [id number]  Date: 09/06/2016  DOB: February 02, 1966  Follow-Up Visit Note  CC: Marina Goodell, MD  Gordy Levan, MD    ICD-9-CM ICD-10-CM   1. Endometrial cancer (HCC) 182.0 C54.1     Diagnosis: Stage IIIC grade 2 endometrial adenocarcinoma    Interval Since Last Radiation: 3 years and 4 months.  May 13 through May 14, 2013: Pelvis 45 gray in 25 fractions  Narrative:  The patient returns today for routine follow-up. She denies having pain or bladder issues. She reports occasional diarrhea which she thinks is die to having  Her gallbladder removed. She denies having vaginal or rectal bleeding.  ALLERGIES:  is allergic to lactose intolerance (gi).  Meds: Current Outpatient Prescriptions  Medication Sig Dispense Refill  . acetaminophen (TYLENOL) 500 MG tablet Take 1,000 mg by mouth every 6 (six) hours as needed for moderate pain (pain).    [name] aspirin 81 MG tablet Take 81 mg by mouth every morning.     . bifidobacterium infantis (ALIGN) capsule Take 1 capsule by mouth daily as needed (for regularity). Reported on 02/23/2016    . carvedilol (COREG) 3.125 MG tablet take 1 tablet by mouth every morning and 2 tablets by mouth every evening 90 tablet 0  . diazepam (VALIUM) 5 MG tablet Take 5 mg by mouth every 8 (eight) hours as needed for anxiety. Disp. 30 tablets. 3 refills.  Called in to [facility] per Dr. Sondra Come on 07/18/16.    . furosemide (LASIX) 20 MG tablet Take 1 tablet (20 mg total) by mouth daily as needed for fluid. 30 tablet 0  . ibuprofen (ADVIL,MOTRIN) 800 MG tablet Take 1 tablet (800 mg total) by mouth every 8 (eight) hours as needed. 60 tablet 0  . lactase (LACTAID) 3000 UNITS tablet Take 1 tablet by mouth 3 (three) times daily as needed (for dairy intake). Reported on 02/23/2016    . loperamide (IMODIUM) 2 MG capsule Take 2 mg by mouth 4 (four) times daily as needed for diarrhea  or loose stools (diarrhea). Reported on 02/23/2016    . magnesium chloride (SLOW-MAG) 64 MG TBEC SR tablet Take 1-2 tablets by mouth 2 (two) times daily. Takes two in the morning and one in the evening    . mometasone (NASONEX) 50 MCG/ACT nasal spray Place 2 sprays into the nose daily. One spray in each nostril. 17 g 5  . omeprazole (PRILOSEC) 20 MG capsule Take 1 capsule (20 mg total) by mouth 2 (two) times daily. 30 capsule 0  . polyethylene glycol powder (GLYCOLAX/MIRALAX) powder Take 17 g by mouth daily as needed. 3350 g 1  . potassium chloride (K-DUR) 10 MEQ tablet Take 2 tablets (20 mEq total) by mouth 2 (two) times daily. 120 tablet 4  . sertraline (ZOLOFT) 100 MG tablet Take 1 tablet (100 mg total) by mouth daily. take 1 tablet by mouth once daily (Patient taking differently: Take 100 mg by mouth every evening. ) 30 tablet 1   No current facility-administered medications for this encounter.     Physical Findings: The patient is in no acute distress. Patient is alert and oriented.  height is 5\' 6"  (1.676 m) and weight is 311 lb 9.6 oz (141.3 kg) (abnormal). Her oral temperature is 98.7 F (37.1 C). Her blood pressure is 122/80 and her pulse is 91. Her oxygen saturation is 99%.   No palpable supraclavicular or axillary  adenopathy. The lungs are clear to auscultation. The heart has a regular rhythm and rate. The abdomen is soft and nontender with normal bowel sounds. No inguinal adenopathy appreciated.  On pelvic examination the external genitalia are unremarkable. A speculum exam is performed. No mucosal lesions noted in the vaginal vault. . On bimanual examination there no obvious pelvic masses although exam is compromised in light of the patient's body habitus. The patient would not permit a rectal exam.  Lab Findings: Lab Results  Component Value Date   WBC 5.4 01/05/2016   HGB 14.1 01/05/2016   HCT 42.8 01/05/2016   MCV 87.3 01/05/2016   PLT 267 01/05/2016    Radiographic  Findings: No results found.  Impression:  No evidence of recurrence on clinical exam today.   Plan: She will follow up with Dr. Skeet Latch in 3 months and myself in 6 months. ____________________________________  Blair Promise, PhD, MD   This document serves as a record of services personally performed by Gery Pray, MD. It was created on his behalf by Bethann Humble, a trained medical scribe. The creation of this record is based on the scribe's personal observations and the provider's statements to them. This document has been checked and approved by the attending provider.

## 2016-09-06 NOTE — Progress Notes (Signed)
Deborah [name] here for follow up.  She denies having pain or any bladder issues.  She reports having occasional diarrhea which she thinks is due to having her gallbladder removed.  She denies having vaginal or rectal bleeding.  She is using a vaginal dilator occasional.  She denies having fatigue.  BP 122/80 (BP Location: Left Wrist, Patient Position: Sitting)   Pulse 91   Temp 98.7 F (37.1 C) (Oral)   Ht 5\' 6"  (1.676 m)   Wt (!) 311 lb 9.6 oz (141.3 kg)   LMP 09/20/2012   SpO2 99%   BMI 50.29 kg/m   Wt Readings from Last 3 Encounters:  09/06/16 (!) 311 lb 9.6 oz (141.3 kg)  02/23/16 (!) 315 lb (142.9 kg)  01/05/16 (!) 310 lb 12.8 oz (141 kg)

## 2016-09-10 ENCOUNTER — Other Ambulatory Visit: Payer: Self-pay | Admitting: Student

## 2016-09-10 DIAGNOSIS — C541 Malignant neoplasm of endometrium: Secondary | ICD-10-CM

## 2016-09-10 NOTE — Telephone Encounter (Signed)
Pt is calling for a refill on her Potassium. jw

## 2016-09-11 MED ORDER — POTASSIUM CHLORIDE ER 10 MEQ PO TBCR: 20.0000 meq | EXTENDED_RELEASE_TABLET | Freq: Two times a day (BID) | ORAL | 4 refills | Status: DC

## 2016-09-11 NOTE — Telephone Encounter (Signed)
Pt is calling to check the status of her request for a refill on Potassium. jw

## 2016-09-11 NOTE — Telephone Encounter (Signed)
2nd request.  Rameen Gohlke L, RN  

## 2016-09-28 ENCOUNTER — Ambulatory Visit: Payer: Medicaid Other | Admitting: Student

## 2016-10-03 ENCOUNTER — Other Ambulatory Visit: Payer: Self-pay | Admitting: Cardiology

## 2016-10-03 NOTE — Telephone Encounter (Signed)
REFILL 

## 2016-10-17 IMAGING — US US ABDOMEN LIMITED
1 series · 14 of 25 positions shown · non-contrast
Comparison: CT abdomen pelvis - 08/25/2013

CLINICAL DATA: Right upper quadrant abdominal pain since last
evening.

EXAM:
US ABDOMEN LIMITED - RIGHT UPPER QUADRANT

[Series 1: us abdomen limited · 0.25mm/px · 14 of 51 slices shown]
[im 1/51]
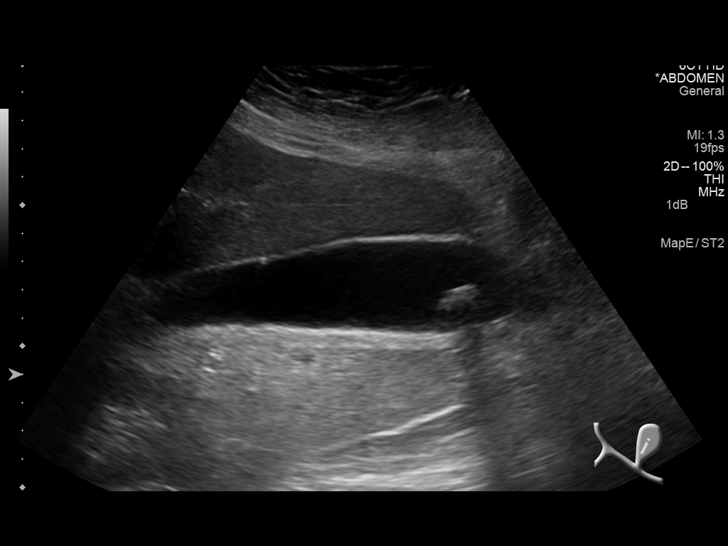
[im 5/51]
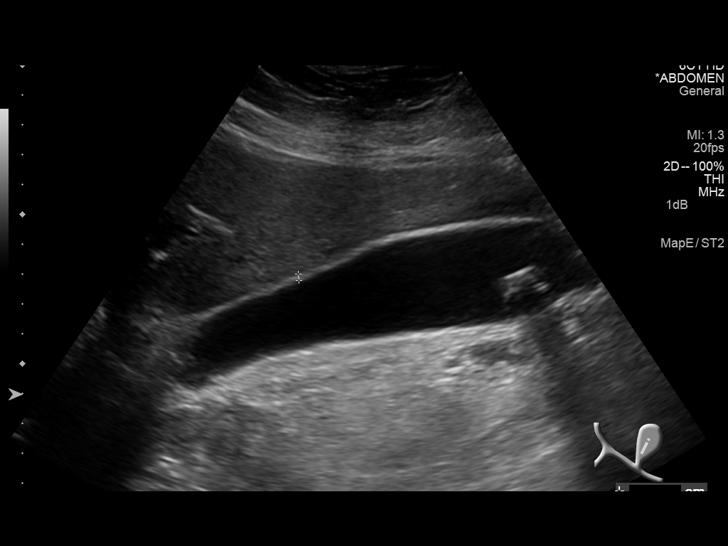
[im 9/51]
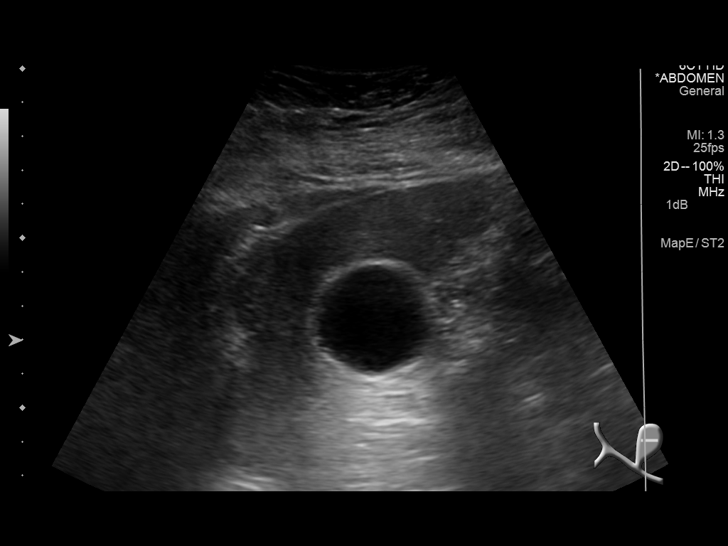
[im 13/51]
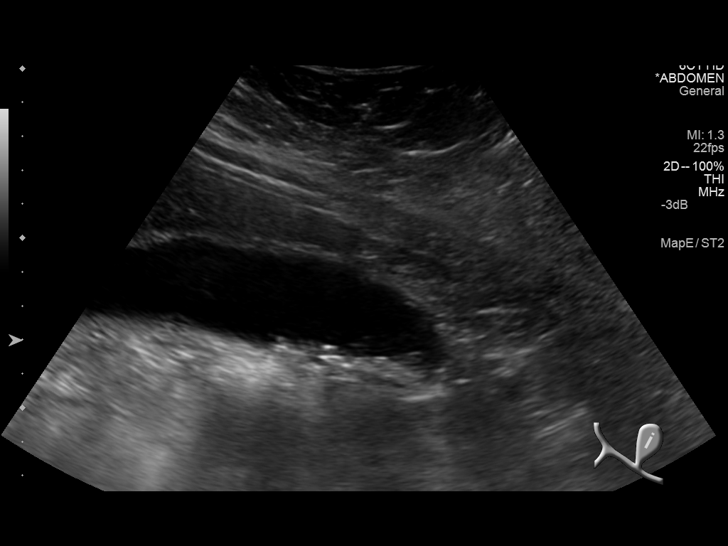
[im 17/51]
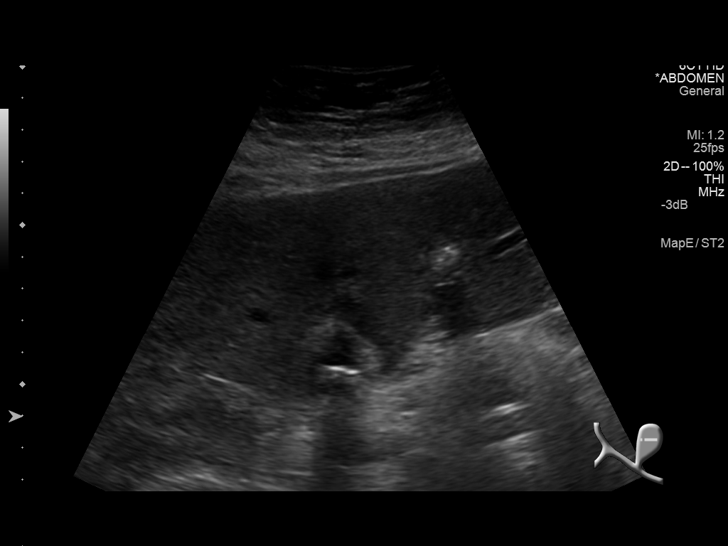
[im 19/51]
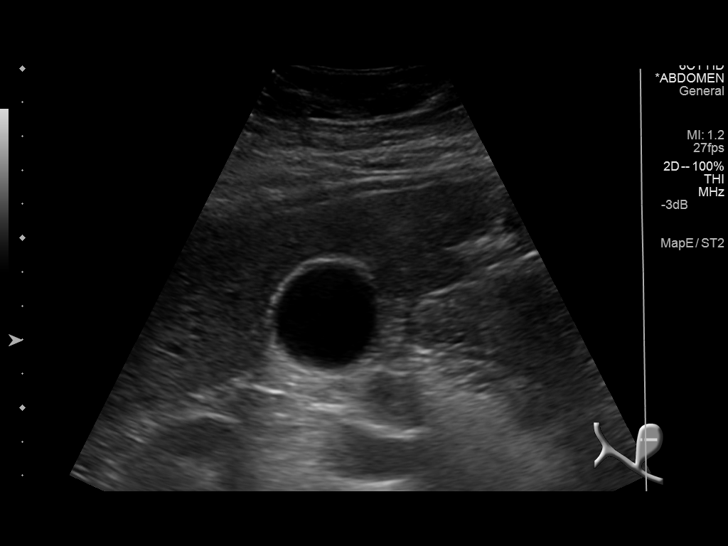
[im 23/51]
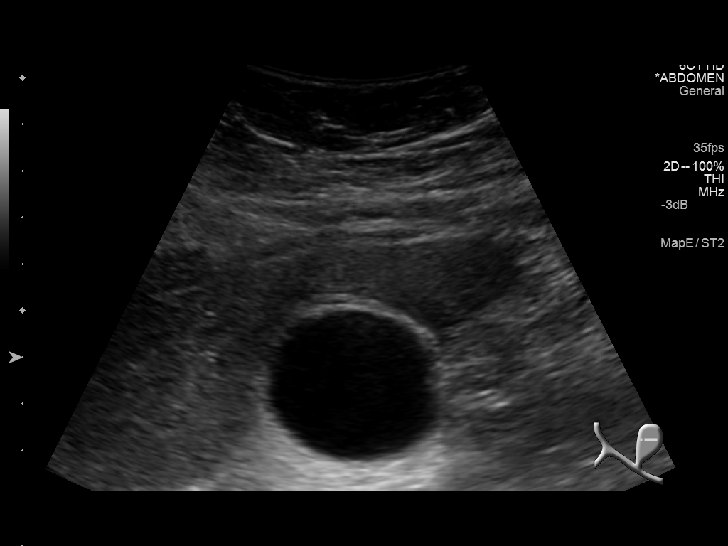
[im 28/51]
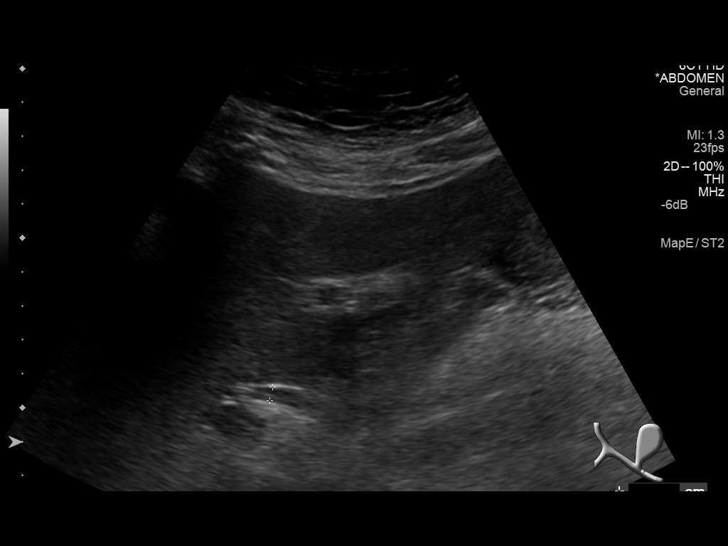
[im 32/51]
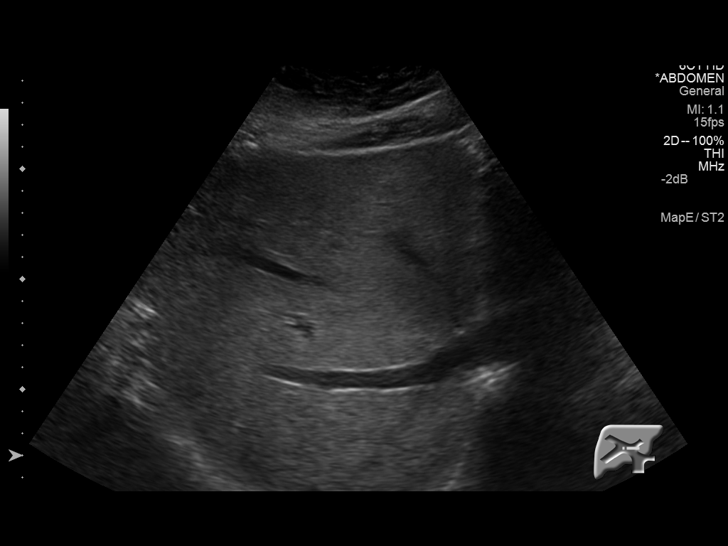
[im 34/51]
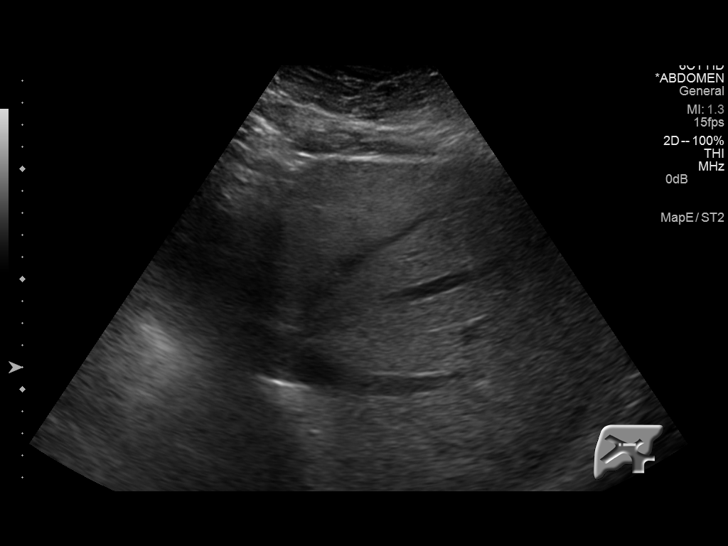
[im 38/51]
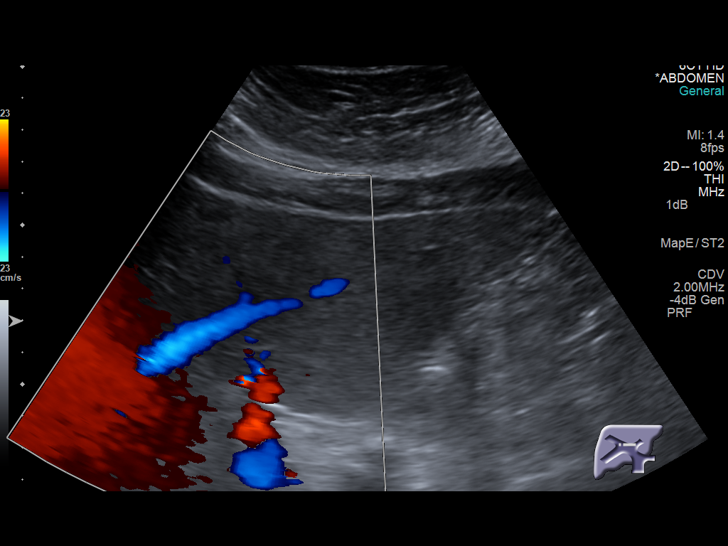
[im 42/51]
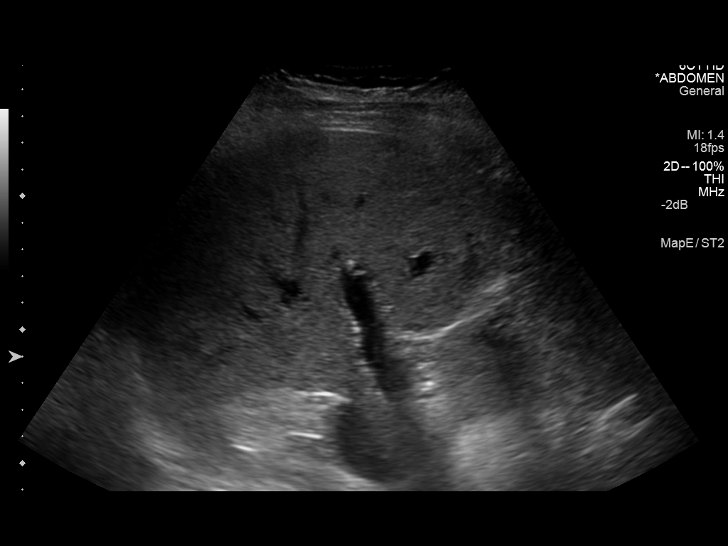
[im 46/51]
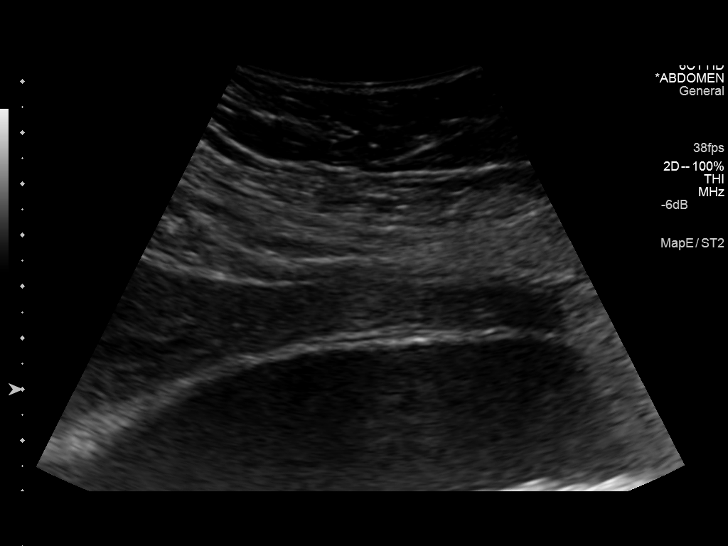
[im 51/51]
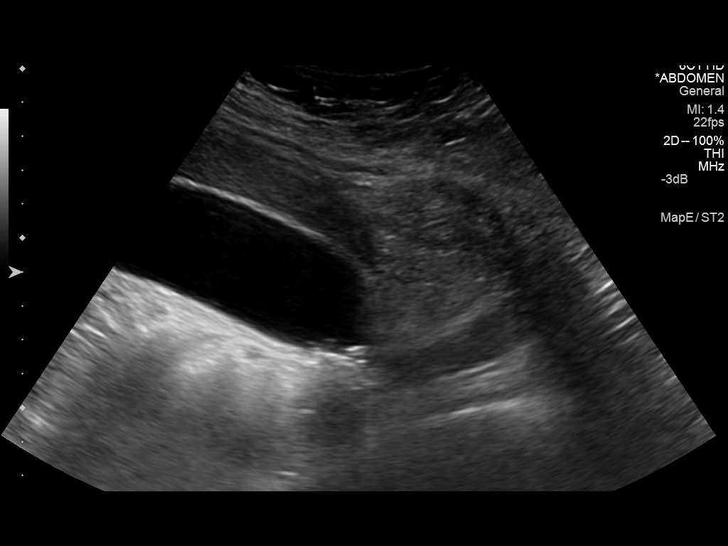

[14 of 25 positions shown; findings below may reference images not displayed]

FINDINGS: Gallbladder:

Mole bowel echogenic stones, largest of which within the gallbladder
fundus measures approximately 1.6 cm in diameter (image 11). Note is
made of a trace amount of pericholecystic fluid (images 29 and 31).
No gallbladder wall thickening. Negative sonographic Murphy's sign.

Common bile duct:

Diameter: Normal in size measuring 4 mm in diameter

Liver:

There is mild increased and slightly coarsened echogenicity of the
hepatic parenchyma. No discrete hepatic lesions. No intrahepatic
biliary ductal dilatation. No ascites.
IMPRESSION: Cholelithiasis was potential trace amount of pericholecystic fluid.
Findings are nonspecific in the absence of gallbladder wall
thickening or a positive sonographic Murphy's sign. If clinical
concern persists for early acute cholecystitis, further evaluation
could be performed with HIDA scan as indicated.

## 2016-10-20 IMAGING — MG MM DIAG BREAST TOMO BILATERAL
8 of 14 series · 8 of 40 positions shown · non-contrast
Comparison: Previous mammograms and ultrasounds from 08/27/2014,
08/20/2013, 01/23/2013.

CLINICAL DATA: Followup of probably benign bilateral breast masses.

EXAM:
DIGITAL DIAGNOSTIC BILATERAL MAMMOGRAM WITH 3D TOMOSYNTHESIS AND CAD

[L MLO (1 of 2)]
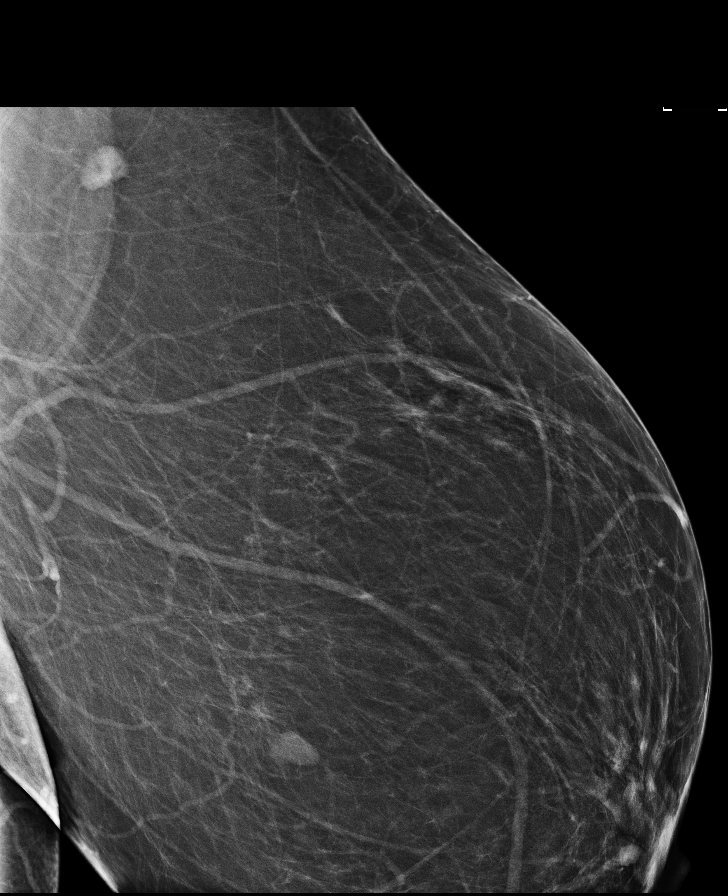

[R MLO (1 of 2)]
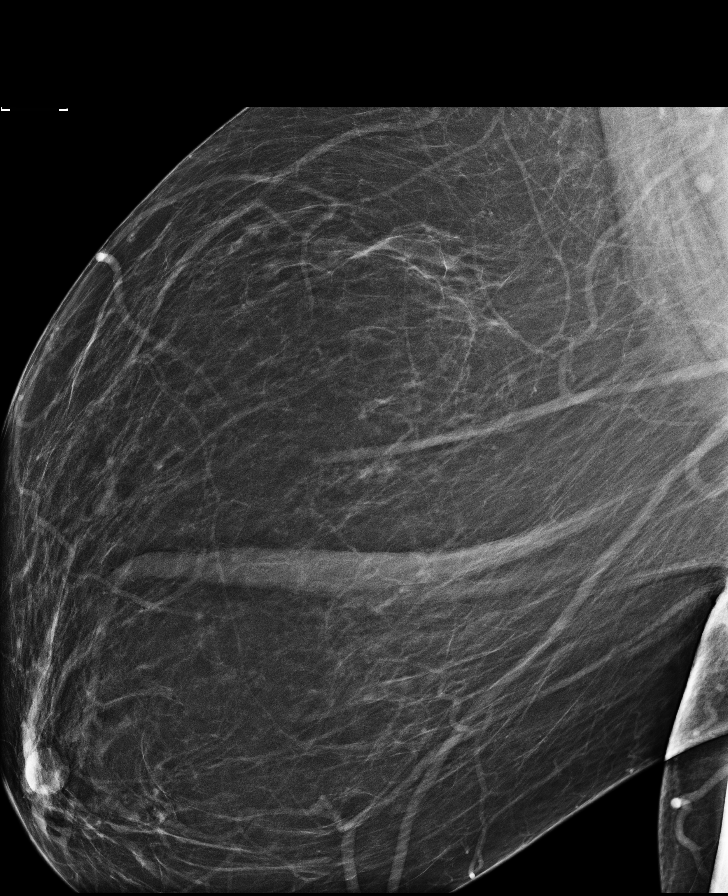

[R CC]
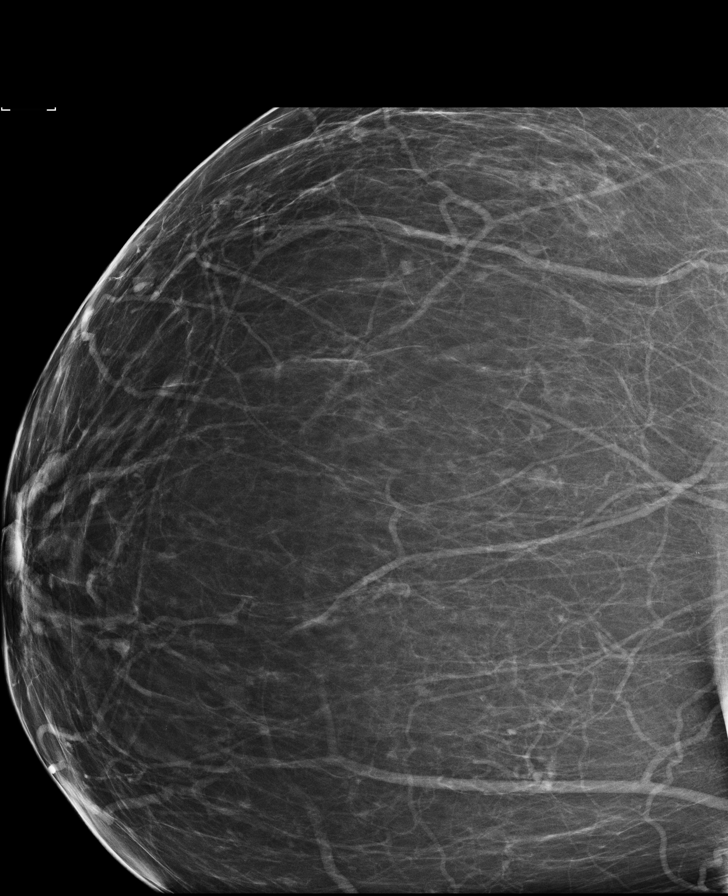

[L CC]
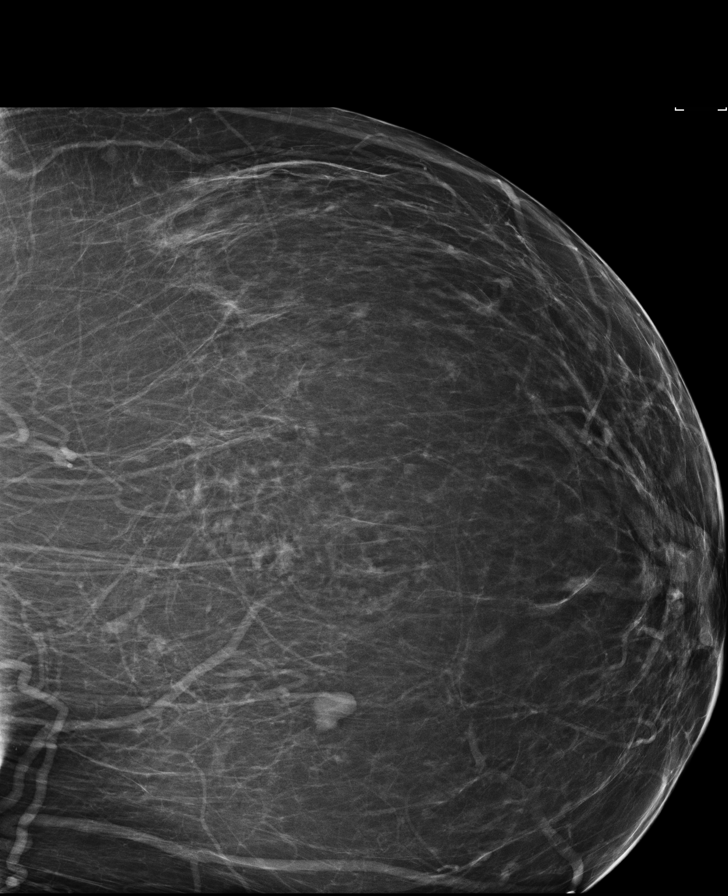

[L MLO (2 of 2)]
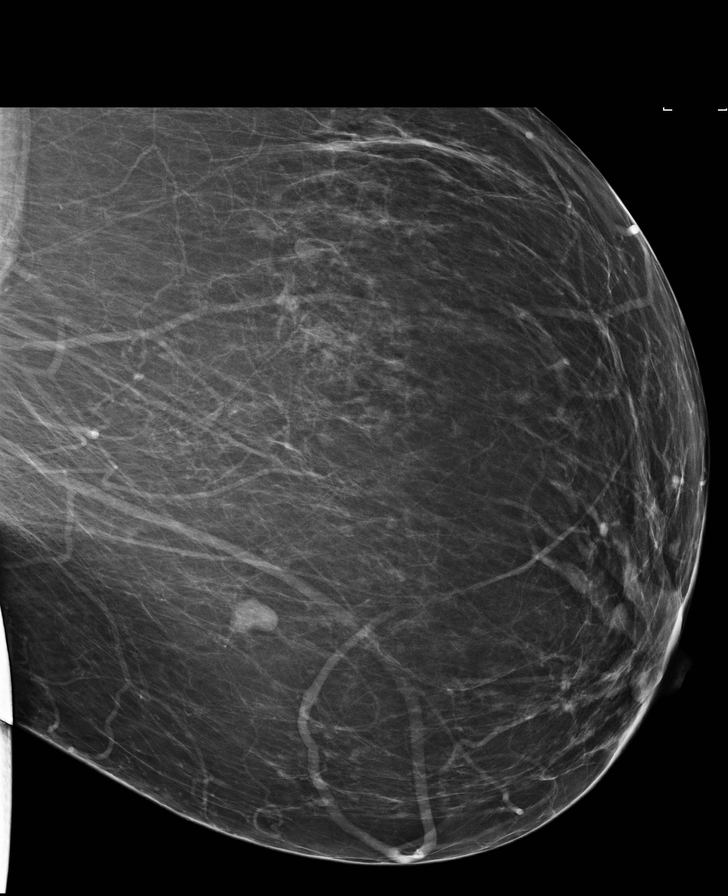

[L CV]
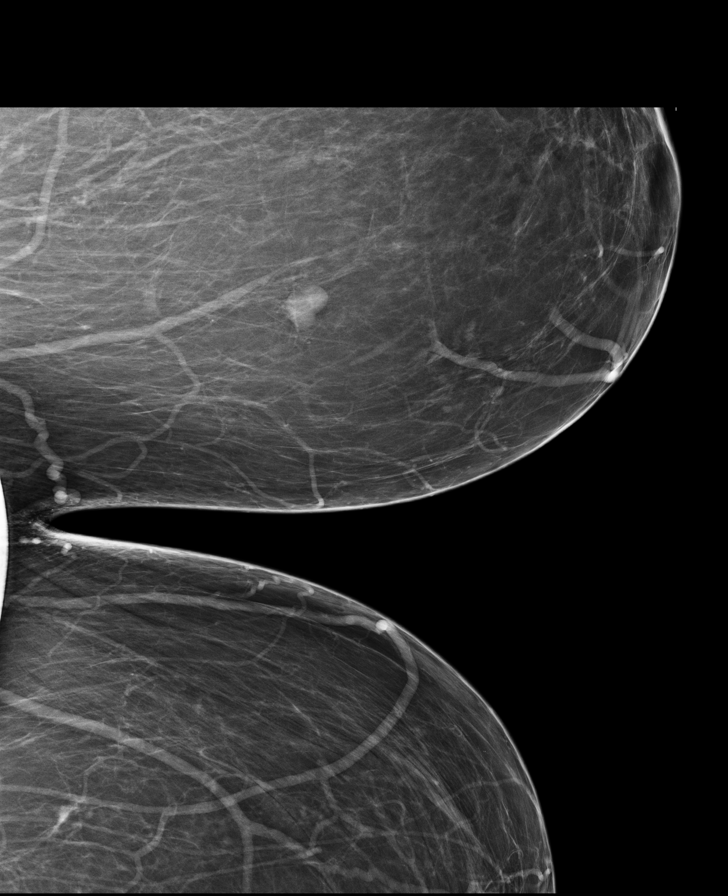

[R MLO (2 of 2)]
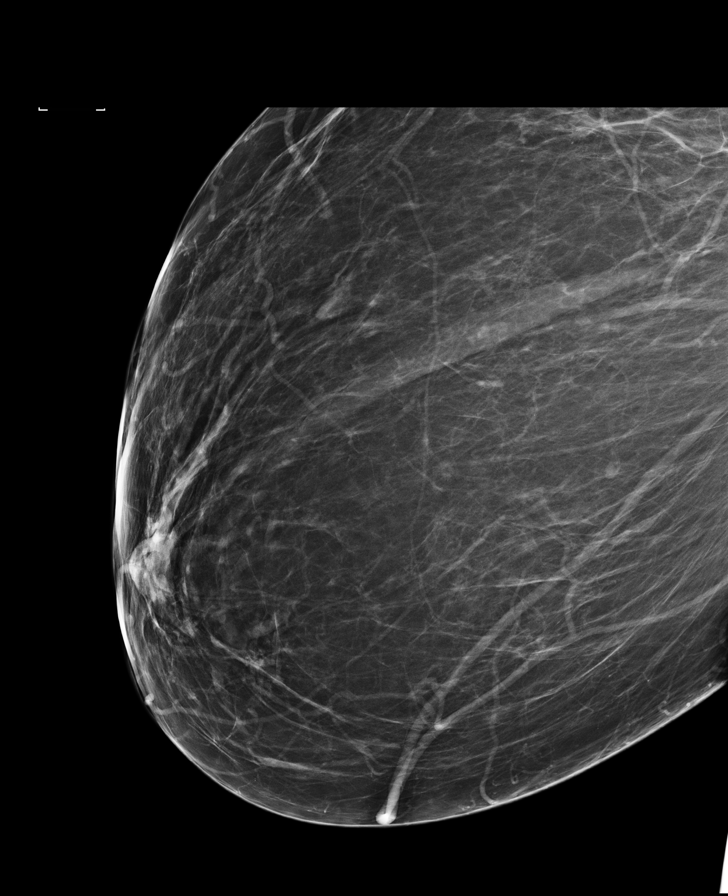

[L MLO tomo · tomo slice 43/85.0]
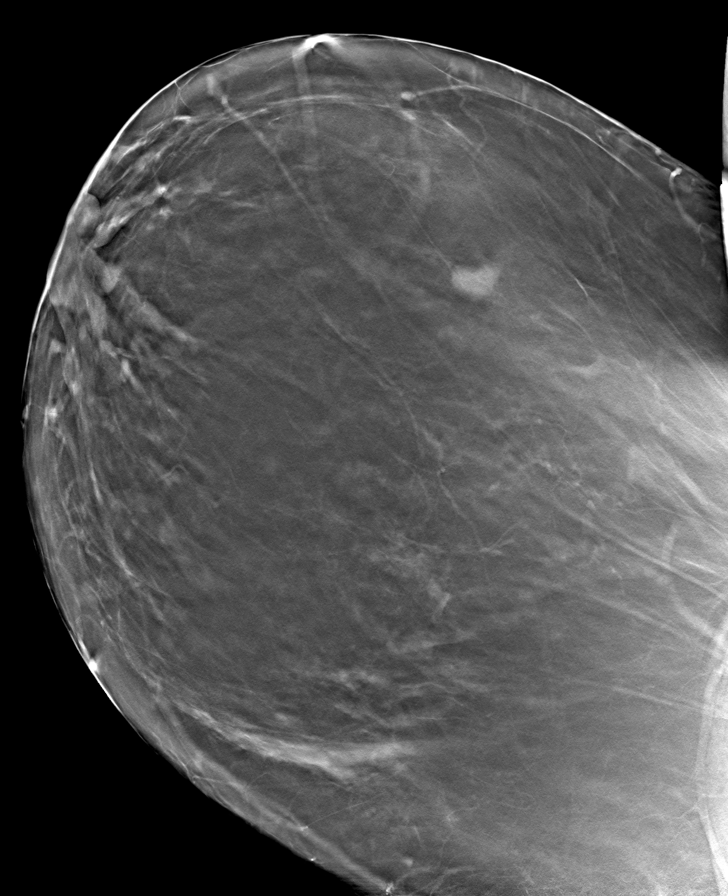

[8 of 40 positions shown; findings below may reference images not displayed]

ACR Breast Density Category b: There are scattered areas of
fibroglandular density.
FINDINGS: No suspicious masses or calcifications are seen in either breast.
The previously seen probably benign mass in the right breast at 6
o'clock has near resolved in the interval. The oval circumscribed
gently lobulated mass within the lower slightly inner left breast
measuring 1.3 cm is unchanged dating back to 01/23/2013. There is no
mammographic evidence of malignancy in either breast.

Mammographic images were processed with CAD.
IMPRESSION: 1. Probably benign right breast mass has near resolved in the
interval, and therefore is considered benign.

2. Unchanged benign mass in the lower inner left breast which has
demonstrated greater than 2 years of stability and is therefore
considered benign.

Three.  No mammographic evidence of malignancy in either breast.

RECOMMENDATION:
Screening mammogram in one year.(Code:79-P-DX4)

I have discussed the findings and recommendations with the patient.
Results were also provided in writing at the conclusion of the
visit. If applicable, a reminder letter will be sent to the patient
regarding the next appointment.

BI-RADS CATEGORY  2: Benign.

## 2016-10-29 IMAGING — CR DG KNEE AP/LAT W/ SUNRISE*L*
2 series · 2 of 2 positions shown · non-contrast
Comparison: 03/10/2012.

CLINICAL DATA: Osteoarthritis.  Pain.  Initial evaluation.

EXAM:
LEFT KNEE 3 VIEWS

[view not recorded (1 of 2)]
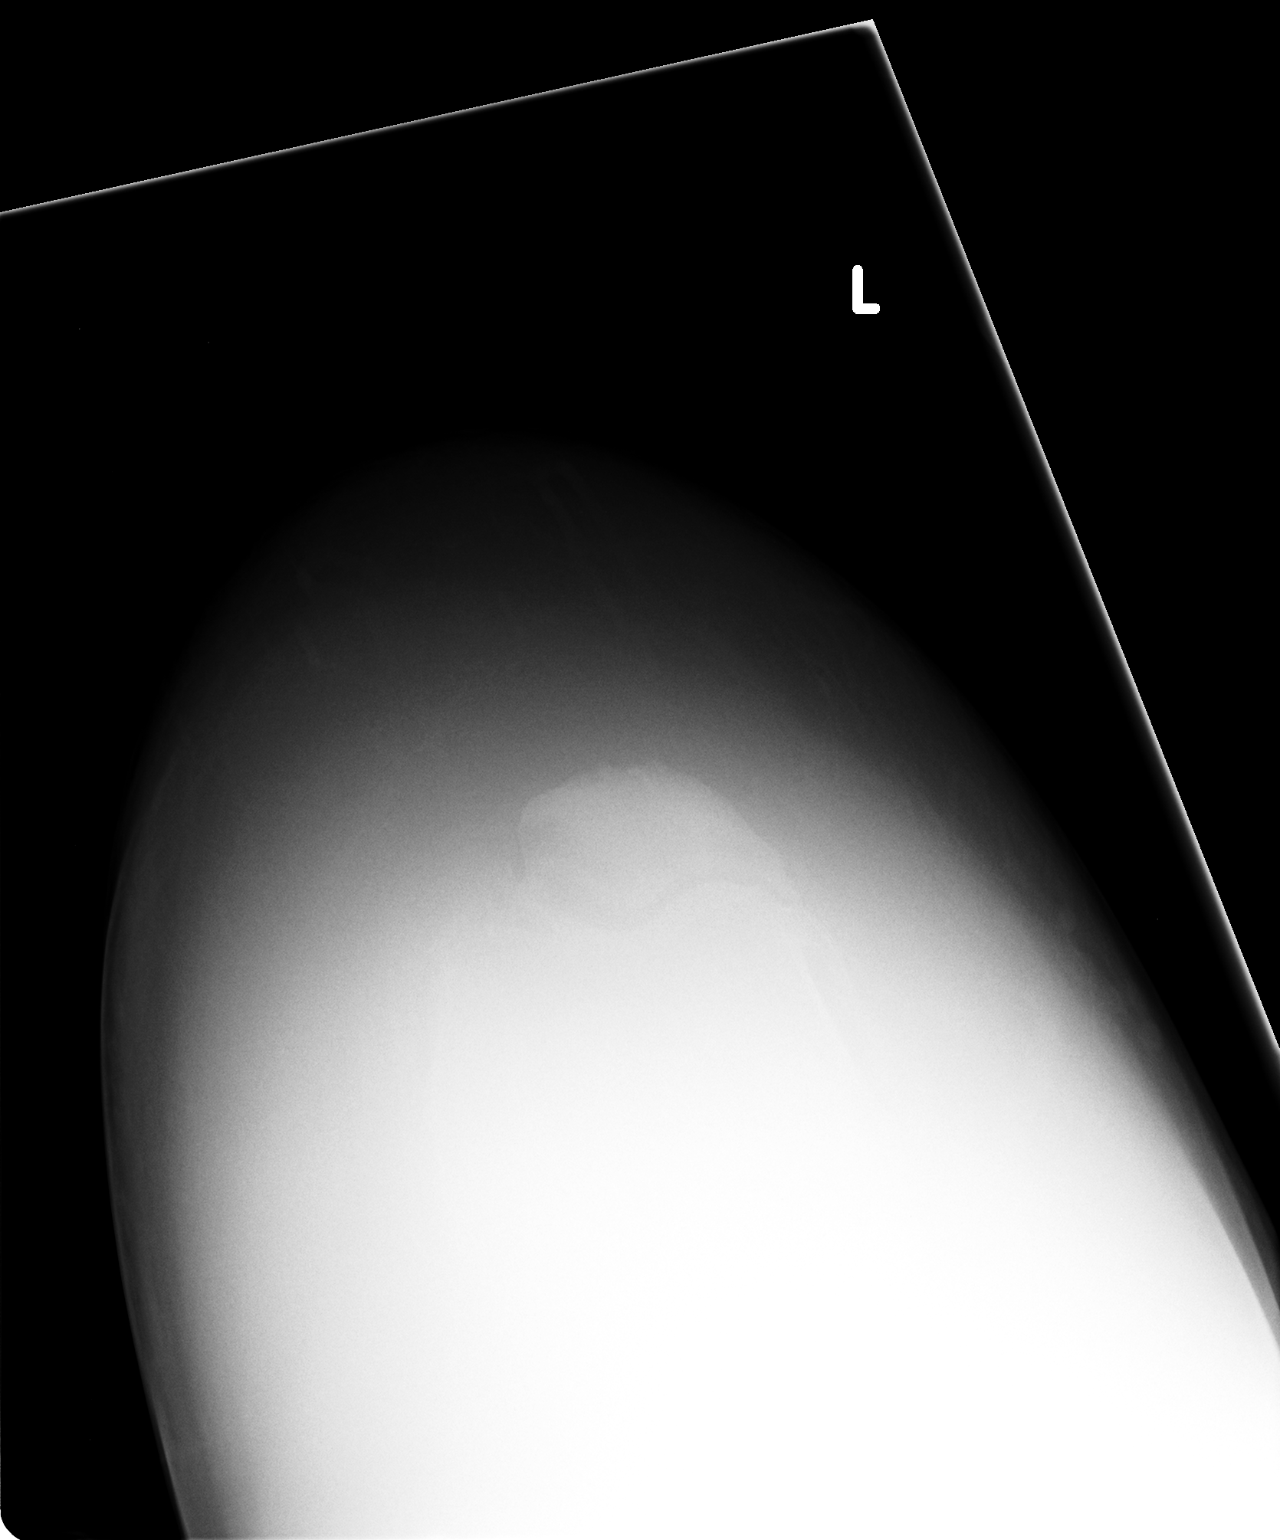

[view not recorded (2 of 2)]
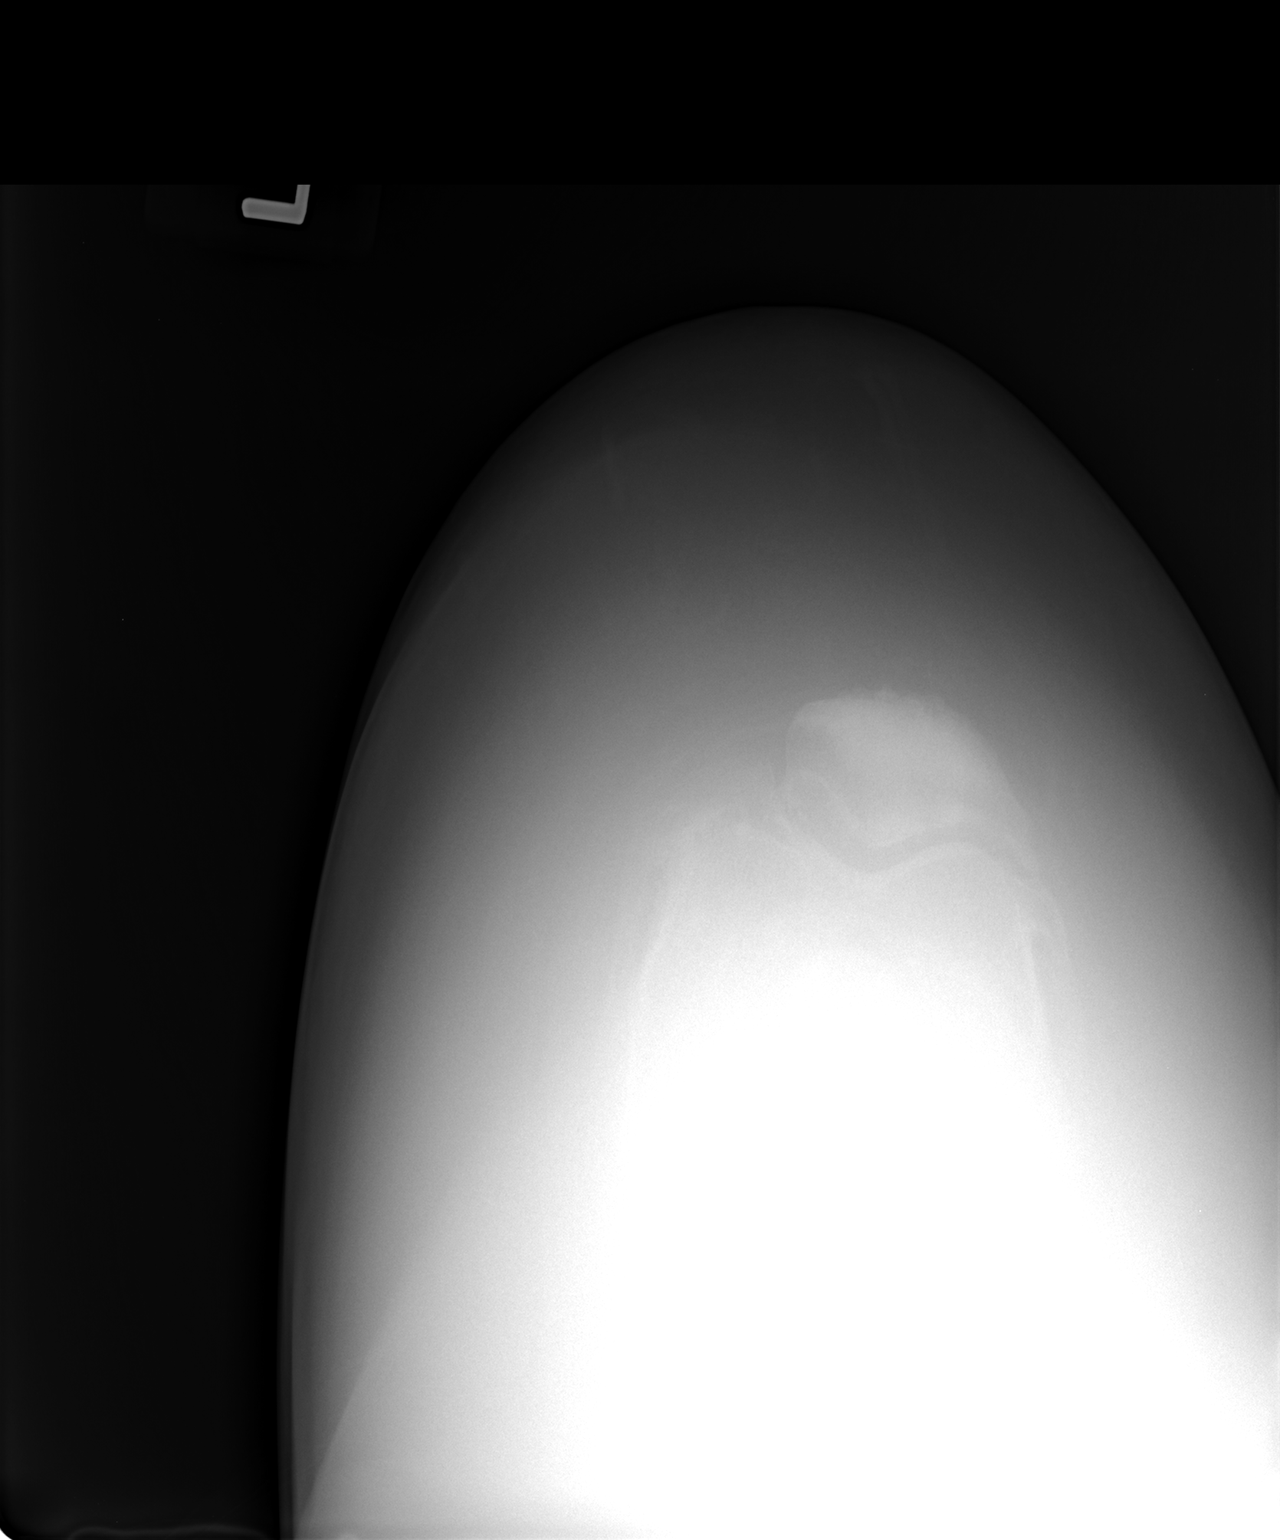

[2 of 2 positions shown; findings below may reference images not displayed]

FINDINGS: Severe progressive degenerative changes noted left knee.
Tricompartment involvement noted. Degenerative changes most severe
about the medial and patellofemoral compartments. Chondrocalcinosis,
most likely degenerative noted. No evidence of fracture or
dislocation.
IMPRESSION: Progressive tricompartment degenerative change. Degenerative changes
are most severe about the medial patellofemoral compartments.

## 2016-10-30 ENCOUNTER — Ambulatory Visit: Payer: Medicaid Other | Admitting: Cardiology

## 2016-11-07 ENCOUNTER — Other Ambulatory Visit: Payer: Self-pay | Admitting: Cardiology

## 2016-11-07 ENCOUNTER — Telehealth: Payer: Self-pay | Admitting: Cardiology

## 2016-11-07 ENCOUNTER — Other Ambulatory Visit: Payer: Self-pay

## 2016-11-07 ENCOUNTER — Other Ambulatory Visit: Payer: Self-pay | Admitting: Oncology

## 2016-11-07 MED ORDER — CARVEDILOL 3.125 MG PO TABS: 9.3750 mg | ORAL_TABLET | Freq: Three times a day (TID) | ORAL | 0 refills | Status: DC

## 2016-11-07 NOTE — Telephone Encounter (Addendum)
Called in refill of diazepam 5 mg tablets (disp. 30, 3 refills) to [facility] per Dr. Sondra Come.  Called Deborah [name] and let her know the refill had been called in.

## 2016-11-07 NOTE — Telephone Encounter (Signed)
Refill request  Carvedilol (COREG) 3.125 MG tablet  [facility] - [place], [place] - [place]

## 2016-11-07 NOTE — Telephone Encounter (Signed)
Isidra left a message requesting a refill on Diazepam.  She last had it filled on 07/18/16 with 3 refills.

## 2016-11-12 ENCOUNTER — Ambulatory Visit: Payer: Medicaid Other | Admitting: Cardiology

## 2016-11-14 NOTE — Progress Notes (Deleted)
HPI The patient presents for evaluation of a reduced EF.  In  2000 she had a stress perfusion study. This demonstrated possibly a reduced ejection fraction of 47%. There was artifact. There was perhaps some hypoperfusion of the anterior septum. Cardiac catheterization was planned but this did not happen.  In 2014 dobutamine echocardiogram confirmed the ejection fraction was somewhat low at 35%. In 2015 an echocardiogram demonstrated the EF was now normal.  ***  Since I last saw her she has done well.  The patient denies any new symptoms such as chest discomfort, neck or arm discomfort. There has been no new shortness of breath, PND or orthopnea. There have been no reported palpitations, presyncope or syncope.  She lives on the 3rd floor and takes the stairs with mild SOB but no other symptoms.     Allergies  Allergen Reactions  . Lactose Intolerance (Gi) Diarrhea    Current Outpatient Prescriptions  Medication Sig Dispense Refill  . acetaminophen (TYLENOL) 500 MG tablet Take 1,000 mg by mouth every 6 (six) hours as needed for moderate pain (pain).    [name] aspirin 81 MG tablet Take 81 mg by mouth every morning.     . bifidobacterium infantis (ALIGN) capsule Take 1 capsule by mouth daily as needed (for regularity). Reported on 02/23/2016    . carvedilol (COREG) 3.125 MG tablet Take 3 tablets (9.375 mg total) by mouth 3 (three) times daily. TAKE 1 TABLET IN THE MORNING AND TWO TABLETS IN THE EVENING. 90 tablet 0  . diazepam (VALIUM) 5 MG tablet Take 5 mg by mouth every 8 (eight) hours as needed for anxiety. Disp. 30 tablets. 3 refills.  Called in to [facility] per Dr. Sondra Come on 11/07/16.    . furosemide (LASIX) 20 MG tablet Take 1 tablet (20 mg total) by mouth daily as needed for fluid. 30 tablet 0  . ibuprofen (ADVIL,MOTRIN) 800 MG tablet Take 1 tablet (800 mg total) by mouth every 8 (eight) hours as needed. 60 tablet 0  . lactase (LACTAID) 3000 UNITS tablet Take 1 tablet by mouth 3 (three) times  daily as needed (for dairy intake). Reported on 02/23/2016    . loperamide (IMODIUM) 2 MG capsule Take 2 mg by mouth 4 (four) times daily as needed for diarrhea or loose stools (diarrhea). Reported on 02/23/2016    . magnesium chloride (SLOW-MAG) 64 MG TBEC SR tablet Take 1-2 tablets by mouth 2 (two) times daily. Takes two in the morning and one in the evening    . mometasone (NASONEX) 50 MCG/ACT nasal spray Place 2 sprays into the nose daily. One spray in each nostril. 17 g 5  . omeprazole (PRILOSEC) 20 MG capsule Take 1 capsule (20 mg total) by mouth 2 (two) times daily. 30 capsule 0  . polyethylene glycol powder (GLYCOLAX/MIRALAX) powder Take 17 g by mouth daily as needed. 3350 g 1  . potassium chloride (K-DUR) 10 MEQ tablet Take 2 tablets (20 mEq total) by mouth 2 (two) times daily. 120 tablet 4  . sertraline (ZOLOFT) 100 MG tablet Take 1 tablet (100 mg total) by mouth daily. take 1 tablet by mouth once daily (Patient taking differently: Take 100 mg by mouth every evening. ) 30 tablet 1   No current facility-administered medications for this visit.     Past Medical History:  Diagnosis Date  . Anemia   . Anxiety   . Arthritis    ,Knees, back  . Congenital birth defect    Right hand  .  Depression   . Endometrial cancer ([place])   . HLD (hyperlipidemia)    borderline  . Hx of echocardiogram    Echo (9/15): EF 50-55%, normal wall motion, grade 2 diastolic dysfunction, mild LAE  . Hx of radiation therapy 04/07/13- 05/14/13   pelvis 45 gray 25 fx  . Hypertension   . Obesity   . Swelling    ANKLES - TAKES LASIX    Past Surgical History:  Procedure Laterality Date  . ABDOMINAL HYSTERECTOMY  10/2011   complete  . CHOLECYSTECTOMY N/A 07/08/2015   Procedure: LAPAROSCOPIC CHOLECYSTECTOMY WITH INTRAOPERATIVE CHOLANGIOGRAM;  Surgeon: Judeth Horn, MD;  Location: [place] [facility];  Service: General;  Laterality: N/A;  . HIATAL HERNIA REPAIR    . REPAIR VAGINAL CUFF  10/07/2012   Procedure: REPAIR VAGINAL  CUFF;  Surgeon: [name], MD PHD;  Location: [facility];  Service: Gynecology;;  . ROBOTIC ASSISTED TOTAL HYSTERECTOMY WITH BILATERAL SALPINGO OOPHERECTOMY  10/07/2012   Procedure: ROBOTIC ASSISTED TOTAL HYSTERECTOMY WITH BILATERAL SALPINGO OOPHORECTOMY;  Surgeon: [name], MD PHD;  Location: [facility];  Service: Gynecology;  Laterality: Bilateral;    ROS:  ***  As stated in the history of present illness and negative for all other systems.  PHYSICAL EXAM LMP 09/20/2012  GENERAL:  Well appearing NECK:  No jugular venous distention, waveform within normal limits, carotid upstroke brisk and symmetric, no bruits, no thyromegaly LUNGS:  Clear to auscultation bilaterally HEART:  PMI not displaced or sustained,S1 and S2 within normal limits, no S3, no S4, possible click, no rubs, no murmurs ABD:  Flat, positive bowel sounds normal in frequency in pitch, no bruits, no rebound, no guarding, no midline pulsatile mass, no hepatomegaly, no splenomegaly EXT:  2 plus pulses throughout, no edema, no cyanosis no clubbing, right hand congenital abnormality  EKG:  ***  ASSESSMENT AND PLAN  Chest pain - She has had no new symptoms.  She has no evidence of ischemia on her previous stress test. Therefore, no further workup is suggested.  Cardiomyopathy -  She seems to be tolerating the low-dose beta blocker but barely. Her EF is better.  She will stay on the same meds.

## 2016-11-15 ENCOUNTER — Ambulatory Visit: Payer: Medicaid Other | Admitting: Cardiology

## 2016-12-07 ENCOUNTER — Telehealth: Payer: Self-pay | Admitting: Cardiology

## 2016-12-07 ENCOUNTER — Other Ambulatory Visit: Payer: Self-pay | Admitting: Cardiology

## 2016-12-07 NOTE — Telephone Encounter (Signed)
ALREADY SENT TODAY 12/07/16 0915 Rx Refill Velna Ochs, CMA

## 2016-12-07 NOTE — Telephone Encounter (Signed)
Patient states her pharmacy told her to call for her refill of Carvedilol 3.125 mg # 90 with 3 refills---the refill would get done quicker if she called.  Please call [facility] at [address]

## 2016-12-12 ENCOUNTER — Ambulatory Visit: Payer: Medicaid Other | Admitting: Cardiology

## 2016-12-12 ENCOUNTER — Other Ambulatory Visit: Payer: Self-pay | Admitting: Nurse Practitioner

## 2016-12-12 ENCOUNTER — Ambulatory Visit: Payer: Medicaid Other | Admitting: Physician Assistant

## 2016-12-23 NOTE — Progress Notes (Deleted)
Office Visit:  GYN ONCOLOGY   CC: Endometrial cancer surveillance  HPI: 51 y.o.  G2 P1 LNMP  06/2012.  Deborah [name] was referred to Dr. Hulan Fray who collected an endometrial biopsy and a endocervical biopsy. Both were positive for endometrial adenocarcinoma.  On 10/07/2012 she underwent robotic hysterectomy bilateral salpingo-oophorectomy. At the time of surgery multiple cystic lesions were noted throughout the entire pelvis that was suspicious for metastatic disease, as such lymph node dissection was not collected but these lesions were biopsied. Final pathology was notable for  1. Soft tissue, biopsy, right para-colic gutter - BENIGN PARA-COLIC GUTTER SOFT TISSUE WITH BENIGN CYST (4.0 CM). SEE COMMENT. - NEGATIVE FOR MALIGNANCY. 2. Uterus +/- tubes/ovaries, neoplastic - ENDOMETRIAL ADENOCARCINOMA, ENDOMETRIOID-TYPE, SEE COMMENT. - TUMOR INVADES LESS THAN 1/2 OF MYOMETRIAL THICKNESS.- NO LYMPHOVASCULAR INVASION IDENTIFIED. - TUMOR INVADES INTO LOWER UTERINE SEGMENT. - UTERINE ADENOMYOSIS. - BENIGN RIGHT AND LEFT OVARIES; NO ATYPIA OR MALIGNANCY PRESENT. - BENIGN RIGHT AND LEFT FALLOPIAN TUBES WITH BENIGN SEROUS-TYPE PARATUBAL CYSTS. - BENIGN CERVIX; NEGATIVE FOR INTRAEPITHELIAL LESION OR MALIGNANCY. - BENIGN MULTILOCULAR UTERINE SEROSAL CYST.  CT abdomen and pelvis 11/25/2012  IMPRESSION:  1. Shotty bilateral external iliac lymphadenopathy measuring up to 1.3 cm, suspicious for metastatic disease. Nonspecific less than 5 mm retroperitoneal lymph nodes also noted in the left para-aortic region. 2. 2.7 cm postop lymphocele versus low attenuation lymphadenopathy in the proximal left external iliac chain. 3. Cholelithiasis and right nephrolithiasis incidentally noted.   End of treatment CT 08/25/2013 notable for no evidence of abdominal lymphadenopathy. There Is no evidence of inflammatory process abscess or ascites the adnexal regions are unremarkable in appearance there is no new or progressive disease  within the abdomen or pelvis  Mammogram 01/2015 wnl Pap 05/2015  No dysplasia,   Interval history:   Past Surgical Hx:  Past Surgical History:  Procedure Laterality Date  . ABDOMINAL HYSTERECTOMY  10/2011   complete  . CHOLECYSTECTOMY N/A 07/08/2015   Procedure: LAPAROSCOPIC CHOLECYSTECTOMY WITH INTRAOPERATIVE CHOLANGIOGRAM;  Surgeon: Judeth Horn, MD;  Location: [place];  Service: General;  Laterality: N/A;  . HIATAL HERNIA REPAIR    . REPAIR VAGINAL CUFF  10/07/2012   Procedure: REPAIR VAGINAL CUFF;  Surgeon: [name], MD PHD;  Location: [facility];  Service: Gynecology;;  . ROBOTIC ASSISTED TOTAL HYSTERECTOMY WITH BILATERAL SALPINGO OOPHERECTOMY  10/07/2012   Procedure: ROBOTIC ASSISTED TOTAL HYSTERECTOMY WITH BILATERAL SALPINGO OOPHORECTOMY;  Surgeon: [name], MD PHD;  Location[facility];  Service: Gynecology;  Laterality: Bilateral;    Past Medical Hx:  Past Medical History:  Diagnosis Date  . Anemia   . Anxiety   . Arthritis    ,Knees, back  . Congenital birth defect    Right hand  . Depression   . Endometrial cancer ([place])   . HLD (hyperlipidemia)    borderline  . Hx of echocardiogram    Echo (9/15): EF 50-55%, normal wall motion, grade 2 diastolic dysfunction, mild LAE  . Hx of radiation therapy 04/07/13- 05/14/13   pelvis 45 gray 25 fx  . Hypertension   . Obesity   . Swelling    ANKLES - TAKES LASIX    Past Gynecological History:  G2 P1 Menarche 57, Patient's last menstrual period was 09/20/2012. No h/o abn pap test  Family Hx:  Family History  Problem Relation Age of Onset  . Diabetes Mother   . Pancreatic cancer Maternal Grandfather     or liver cancer  . Hypertension Maternal Grandfather   .  Cancer Maternal Grandfather     pancreatic  . Hypertension Maternal Grandmother   . Colon polyps Maternal Aunt   . Cancer Maternal Aunt     lung  . Colon cancer Cousin   . Cancer Cousin     lung  . Lung cancer Maternal Aunt   . Stroke Cousin   . Heart  attack Neg Hx    Mat aunt lung cancer Dx 90 (tob use) Mat GF pancreatic cancer Mat cousin lung cancer dx 77's (tob use)  Review of Systems:  Constitutional  Feels well, weight gain, no malaise. Cardiovascular  No chest pain, shortness of breath, or edema  Pulmonary  No cough or wheeze.  Gastro Intestinal  No nausea, vomitting, or diarrhoea. No rectal bleeding. Genito Urinary  No frequency, urgency, dysuria,no vaginal spotting or discharge.  Is compliant with the use of her vaginal dilator Musculo Skeletal  No myalgia, left knee arthralgia,  joint swelling and pain   Physical Exam: LMP 09/20/2012  Wt Readings from Last 3 Encounters:  09/06/16 (!) 311 lb 9.6 oz (141.3 kg)  02/23/16 (!) 315 lb (142.9 kg)  01/05/16 (!) 310 lb 12.8 oz (141 kg)  WD in NAD CHEST:  CTA CARDIAC:  RRR ABDOMEN :  Soft nontender. Trocar sites without evidence of mass or hernia. Periumbilical incision consistent with recent cholecystectomy. Normoactive bowel sounds, abdomen soft, non-tender and morbidly obese.  BACK:  No CVAT LN:  No cervical supra clavicular or inguinal adenopathy PELVIC:  Nl EGBUS, Vaginal cuff intact.  No discharge, bleeding or tenderness or cul de sac nodularity. Rectal:   Good tone no masses EXT:  Missing digits on the left hand.  No lower extremity edema or cyanosis.  Assessment/Plan:  [name]  is a 51 y.o.  year old with grade 2 endometrial adenocarcinoma noted on  the endometrial biopsy and endocervical curettage specimens. She underwent robotic total laparoscopic hysterectomy bilateral salpingo-oophorectomy. At the time of surgery diffuse miliary cystic-like lesions were noted in the cul-de-sac on on the rectosigmoid colon highly suspicious for metastatic disease. Because of these findings lymph node dissection was not performed. Final pathology was notable for grade 2 endometrioid endometrial adenocarcinoma with 12.5% myometrial invasion no lymphovascular space  invasion no cervical involvement without confirmation of metastases in the eplic systs.    Received 6 cycles of Taxol/Carboplatin and pelvic XRT sandwich fashion.  Completed 07/24/2013.   F/u with Gyn  05/2017 Pap collected today Deborah [name] was counseled that follow-up with Dr. Hulan Fray with will begin in March 2017 Counseled on the signs and symptoms of recurrence  Obesity Weight loss occurred with the episode of gangrenous cholecystitis. Deborah [name] has a history of rapid weight loss and weight gain we discussed measures to minimum stabilize her weight.

## 2016-12-24 ENCOUNTER — Ambulatory Visit: Payer: Medicaid Other | Admitting: Gynecologic Oncology

## 2016-12-24 ENCOUNTER — Telehealth: Payer: Self-pay | Admitting: *Deleted

## 2016-12-24 NOTE — Telephone Encounter (Signed)
Pt called lmovm cancelled todays appt. Pt did not give reason.  Returned call to pt, unable to reach, lmov[facility] to call back to reschedule today's appt.

## 2016-12-29 IMAGING — NM NM HEPATO W/GB/PHARM/[PERSON_NAME]
1 series · 12 of 12 positions shown · non-contrast
Comparison: 02/05/2015

CLINICAL DATA: Right upper quadrant pain, nausea, vomiting.

EXAM:
NUCLEAR MEDICINE HEPATOBILIARY IMAGING WITH GALLBLADDER EF
TECHNIQUE: Sequential images of the abdomen were obtained [DATE] minutes
following intravenous administration of radiopharmaceutical. After
slow intravenous infusion of 2.0 micrograms Cholecystokinin,
gallbladder ejection fraction was determined.
RADIOPHARMACEUTICALS:  5.5 mCi Bechnetium-HHm Choletec IV

[Series 1: hepato · 4.46mm/px · 2 acquisitions, 12 frames shown]
[im 1/2]
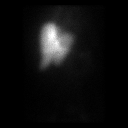
[im 1/2]
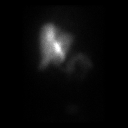
[im 1/2]
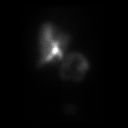
[im 1/2]
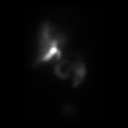
[im 1/2]
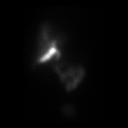
[im 1/2]
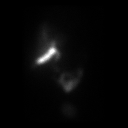
[im 2/2]
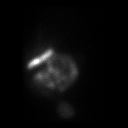
[im 2/2]
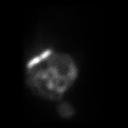
[im 2/2]
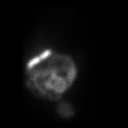
[im 2/2]
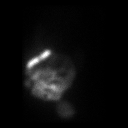
[im 2/2]
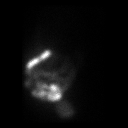
[im 2/2]
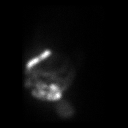

[12 of 12 positions shown; findings below may reference images not displayed]

FINDINGS: Following the IV administration of the radiopharmaceutical there is
uniform tracer uptake by the liver with clearance from blood pool.
Radiotracer accumulation within the small bowel loops is noted by 15
minutes. After 30 minutes there is filling of the gallbladder.
Following the IV administration of CCK the gallbladder ejection
fraction was calculated equal a 29.3%.. At 45 min, normal ejection
fraction is greater than 40%. The patient did not experience
symptoms during CCK infusion.
IMPRESSION: 1. Patent cystic duct without evidence for acute cholecystitis.
2. Low gallbladder ejection fraction equal to 29.3%.

## 2016-12-29 IMAGING — US IR US GUIDE VASC ACCESS RIGHT
1 series · 1 of 1 positions shown · IV contrast (agent unspecified)
Comparison: none

INDICATION: Biliary dyskinesia, poor venous access ; IV access is needed prior
to planned HIDA scan.

EXAM:
ULTRASOUND GUIDED VENOUS ACCESS
MEDICATIONS:
None.
CONTRAST:  None
FLUOROSCOPY TIME:  NONE
COMPLICATIONS:
None immediate
TECHNIQUE: Patient's left arm was prepped and draped in the usual sterile
fashion. Left brachial vein was visualized. 1% lidocaine was given
subcutaneously. Under direct ultrasound guidance the left brachial
vein was accessed and a 4 French angio cath was placed. The catheter
was flushed and secured to the skin.

[Series 1: ir us guide vasc access right · 1 of 1 slices shown]
[im 1/1]
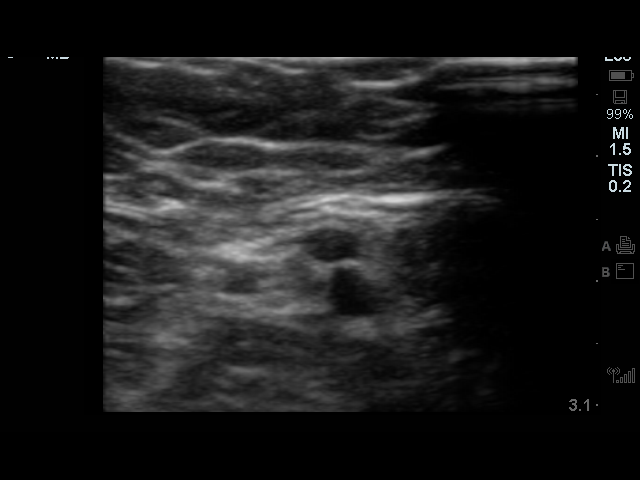

[1 of 1 positions shown; findings below may reference images not displayed]

FINDINGS: Patent left brachial vein
IMPRESSION: Status post successful venous access of left brachial vein and
placement of 4 French Angiocath under ultrasound guidance.

## 2017-01-02 NOTE — Progress Notes (Signed)
HPI The patient presents for evaluation of a reduced EF.  Deborah KitchenShe was seen here in 2000 and and did have a stress perfusion study. This demonstrated possibly a reduced ejection fraction of 47%. There was artifact. There was perhaps some hypoperfusion of the anterior septum. Cardiac catheterization was planned but this did not happen. I saw her in 2016 and I ordered dobutamine echocardiogram. This confirmed the ejection fraction was somewhat low at 35%. However, there was no evidence of ischemia or infarct. She was seen by Richardson Dopp PAc and we did try to add lisinopril to her regimen. However, she did not tolerate this. I increased her Coreg. She saw Nicki Reaper again in Sept 2016.  An echocardiogram at that visit demonstrated the EF was now low normal.   She reports that she still does get some discomfort. This happens sporadically. She feels it might like occasionally her heart is racing. She'll otherwise has some chest tightness. It's difficult for her to quantify or qualify this. It is sporadic. He is limited her activities because of back and joint pain and morbid obesity.  He is she does not describe associated symptoms such as nausea vomiting or diaphoresis. She's not had any presyncope or syncope.    Allergies  Allergen Reactions  . Lactose Intolerance (Gi) Diarrhea    Current Outpatient Prescriptions  Medication Sig Dispense Refill  . acetaminophen (TYLENOL) 500 MG tablet Take 1,000 mg by mouth every 6 (six) hours as needed for moderate pain (pain).    Deborah [name] aspirin 81 MG tablet Take 81 mg by mouth every morning.     . bifidobacterium infantis (ALIGN) capsule Take 1 capsule by mouth daily as needed (for regularity). Reported on 02/23/2016    . carvedilol (COREG) 3.125 MG tablet Take one (1) tablet (3.125 mg) by mouth each morning. Take two (2) tablets (6.25 mg) by mouth each evening. 90 tablet 3  . diazepam (VALIUM) 5 MG tablet Take 5 mg by mouth every 8 (eight) hours as needed for anxiety. Disp. 30  tablets. 3 refills.  Called in to [facility] per Dr. Sondra Come on 11/07/16.    . furosemide (LASIX) 20 MG tablet Take 1 tablet (20 mg total) by mouth daily as needed for fluid. 30 tablet 0  . ibuprofen (ADVIL,MOTRIN) 800 MG tablet Take 1 tablet (800 mg total) by mouth every 8 (eight) hours as needed. 60 tablet 0  . lactase (LACTAID) 3000 UNITS tablet Take 1 tablet by mouth 3 (three) times daily as needed (for dairy intake). Reported on 02/23/2016    . loperamide (IMODIUM) 2 MG capsule Take 2 mg by mouth 4 (four) times daily as needed for diarrhea or loose stools (diarrhea). Reported on 02/23/2016    . magnesium chloride (SLOW-MAG) 64 MG TBEC SR tablet Take 1-2 tablets by mouth 2 (two) times daily. Takes two in the morning and one in the evening    . mometasone (NASONEX) 50 MCG/ACT nasal spray Place 2 sprays into the nose daily. One spray in each nostril. 17 g 5  . omeprazole (PRILOSEC) 20 MG capsule Take 1 capsule (20 mg total) by mouth 2 (two) times daily. 30 capsule 0  . polyethylene glycol powder (GLYCOLAX/MIRALAX) powder Take 17 g by mouth daily as needed. 3350 g 1  . potassium chloride (K-DUR) 10 MEQ tablet Take 2 tablets (20 mEq total) by mouth 2 (two) times daily. 120 tablet 4  . sertraline (ZOLOFT) 100 MG tablet Take 1 tablet (100 mg total) by mouth daily. take  1 tablet by mouth once daily (Patient taking differently: Take 100 mg by mouth every evening. ) 30 tablet 1   No current facility-administered medications for this visit.     Past Medical History:  Diagnosis Date  . Anemia   . Anxiety   . Arthritis    ,Knees, back  . Congenital birth defect    Right hand  . Depression   . Endometrial cancer ([place])   . HLD (hyperlipidemia)    borderline  . Hx of echocardiogram    Echo (9/15): EF 50-55%, normal wall motion, grade 2 diastolic dysfunction, mild LAE  . Hx of radiation therapy 04/07/13- 05/14/13   pelvis 45 gray 25 fx  . Hypertension   . Obesity   . Swelling    ANKLES - TAKES LASIX      Past Surgical History:  Procedure Laterality Date  . ABDOMINAL HYSTERECTOMY  10/2011   complete  . CHOLECYSTECTOMY N/A 07/08/2015   Procedure: LAPAROSCOPIC CHOLECYSTECTOMY WITH INTRAOPERATIVE CHOLANGIOGRAM;  Surgeon: Judeth Horn, MD;  Location: [facility];  Service: General;  Laterality: N/A;  . HIATAL HERNIA REPAIR    . REPAIR VAGINAL CUFF  10/07/2012   Procedure: REPAIR VAGINAL CUFF;  Surgeon: [name], MD PHD;  Location: [facility];  Service: Gynecology;;  . ROBOTIC ASSISTED TOTAL HYSTERECTOMY WITH BILATERAL SALPINGO OOPHERECTOMY  10/07/2012   Procedure: ROBOTIC ASSISTED TOTAL HYSTERECTOMY WITH BILATERAL SALPINGO OOPHORECTOMY;  Surgeon: [name], MD PHD;  Location: [facility];  Service: Gynecology;  Laterality: Bilateral;    ROS:   As stated in the history of present illness and negative for all other systems.  PHYSICAL EXAM BP 114/82 (BP Location: Left Wrist, Patient Position: Sitting, Cuff Size: Large)   Pulse 88   Ht 5\' 6"  (1.676 m)   Wt (!) 318 lb 6 oz (144.4 kg)   LMP 09/20/2012   BMI 51.39 kg/m  GENERAL:  Well appearing NECK:  No jugular venous distention, waveform within normal limits, carotid upstroke brisk and symmetric, no bruits, no thyromegaly LUNGS:  Clear to auscultation bilaterally HEART:  PMI not displaced or sustained,S1 and S2 within normal limits, no S3, no S4, possible click, no rubs, no murmurs ABD:  Flat, positive bowel sounds normal in frequency in pitch, no bruits, no rebound, no guarding, no midline pulsatile mass, no hepatomegaly, no splenomegaly EXT:  2 plus pulses throughout, no edema, no cyanosis no clubbing, right hand congenital abnormality  EKG:  Sinus rhythm, rate 89, axis within normal limits, intervals within normal limits, no acute ST-T changes.  ASSESSMENT AND PLAN  Chest pain - She has symptoms that are difficult to assess. I will screen her with a DSE.    Cardiomyopathy - Her EF will be assessed as above.

## 2017-01-03 ENCOUNTER — Ambulatory Visit (INDEPENDENT_AMBULATORY_CARE_PROVIDER_SITE_OTHER): Payer: Medicaid Other | Admitting: Cardiology

## 2017-01-03 ENCOUNTER — Encounter: Payer: Self-pay | Admitting: Cardiology

## 2017-01-03 VITALS — BP 114/82 | HR 88 | Ht 66.0 in | Wt 318.4 lb

## 2017-01-03 DIAGNOSIS — R072 Precordial pain: Secondary | ICD-10-CM

## 2017-01-03 DIAGNOSIS — I420 Dilated cardiomyopathy: Secondary | ICD-10-CM | POA: Diagnosis not present

## 2017-01-03 MED ORDER — CARVEDILOL 3.125 MG PO TABS: ORAL_TABLET | ORAL | 3 refills | Status: DC

## 2017-01-03 NOTE — Patient Instructions (Signed)
Medication Instructions:  Continue current medications  Labwork: None Ordered  Testing/Procedures: Your physician has requested that you have a stress echocardiogram. For further information please visit [email or web address]. Please follow instruction sheet as given.   Follow-Up: Your physician wants you to follow-up in: 18 Months. You will receive a reminder letter in the mail two months in advance. If you don't receive a letter, please call our office to schedule the follow-up appointment.   Any Other Special Instructions Will Be Listed Below (If Applicable).   If you need a refill on your cardiac medications before your next appointment, please call your pharmacy.

## 2017-01-17 ENCOUNTER — Telehealth (HOSPITAL_COMMUNITY): Payer: Self-pay | Admitting: *Deleted

## 2017-01-17 NOTE — Telephone Encounter (Signed)
Left message on voicemail in reference to upcoming appointment scheduled for 01/24/17. Phone number given for a call back so details instructions can be given. Deborah [name]

## 2017-01-24 ENCOUNTER — Other Ambulatory Visit (HOSPITAL_COMMUNITY): Payer: Medicaid Other

## 2017-01-30 ENCOUNTER — Other Ambulatory Visit: Payer: Self-pay | Admitting: Student

## 2017-01-30 DIAGNOSIS — C541 Malignant neoplasm of endometrium: Secondary | ICD-10-CM

## 2017-01-31 ENCOUNTER — Telehealth (HOSPITAL_COMMUNITY): Payer: Self-pay | Admitting: *Deleted

## 2017-01-31 NOTE — Telephone Encounter (Signed)
Pt calling to request refill of:  Name of Medication(s):  Potassium 10mg  Last date of OV:  09/28/16 Pharmacy:  [facility]   Will route refill request to Clinic RN.  Discussed with patient policy to call pharmacy for future refills.  Also, discussed refills may take up to 48 hours to approve or deny.  Renella Cunas

## 2017-01-31 NOTE — Telephone Encounter (Signed)
Left message on voicemail in reference to upcoming appointment scheduled for 02/07/17. Phone number given for a call back so details instructions can be given. Deborah [name]

## 2017-02-07 ENCOUNTER — Other Ambulatory Visit (HOSPITAL_COMMUNITY): Payer: Medicaid Other

## 2017-02-11 NOTE — Progress Notes (Deleted)
Office Visit:  GYN ONCOLOGY   CC: Endometrial cancer surveillance  HPI: 51 y.o.  G2 P1 LNMP  06/2012.  [name] was referred to Dr. Hulan Fray who collected an endometrial biopsy and a endocervical biopsy. Both were positive for endometrial adenocarcinoma.  On 10/07/2012 she underwent robotic hysterectomy bilateral salpingo-oophorectomy. At the time of surgery multiple cystic lesions were noted throughout the entire pelvis that was suspicious for metastatic disease, as such lymph node dissection was not collected but these lesions were biopsied. Final pathology was notable for  1. Soft tissue, biopsy, right para-colic gutter - BENIGN PARA-COLIC GUTTER SOFT TISSUE WITH BENIGN CYST (4.0 CM). SEE COMMENT. - NEGATIVE FOR MALIGNANCY. 2. Uterus +/- tubes/ovaries, neoplastic - ENDOMETRIAL ADENOCARCINOMA, ENDOMETRIOID-TYPE, SEE COMMENT. - TUMOR INVADES LESS THAN 1/2 OF MYOMETRIAL THICKNESS.- NO LYMPHOVASCULAR INVASION IDENTIFIED. - TUMOR INVADES INTO LOWER UTERINE SEGMENT. - UTERINE ADENOMYOSIS. - BENIGN RIGHT AND LEFT OVARIES; NO ATYPIA OR MALIGNANCY PRESENT. - BENIGN RIGHT AND LEFT FALLOPIAN TUBES WITH BENIGN SEROUS-TYPE PARATUBAL CYSTS. - BENIGN CERVIX; NEGATIVE FOR INTRAEPITHELIAL LESION OR MALIGNANCY. - BENIGN MULTILOCULAR UTERINE SEROSAL CYST.  CT abdomen and pelvis 11/25/2012  IMPRESSION:  1. Shotty bilateral external iliac lymphadenopathy measuring up to 1.3 cm, suspicious for metastatic disease. Nonspecific less than 5 mm retroperitoneal lymph nodes also noted in the left para-aortic region. 2. 2.7 cm postop lymphocele versus low attenuation lymphadenopathy in the proximal left external iliac chain. 3. Cholelithiasis and right nephrolithiasis incidentally noted.   End of treatment CT 08/25/2013 notable for no evidence of abdominal lymphadenopathy. There Is no evidence of inflammatory process abscess or ascites the adnexal regions are unremarkable in appearance there is no new or progressive disease  within the abdomen or pelvis  Mammogram 01/2015 wnl Pap 05/2015  No dysplasia, BV   Past Surgical Hx:  Past Surgical History:  Procedure Laterality Date  . ABDOMINAL HYSTERECTOMY  10/2011   complete  . CHOLECYSTECTOMY N/A 07/08/2015   Procedure: LAPAROSCOPIC CHOLECYSTECTOMY WITH INTRAOPERATIVE CHOLANGIOGRAM;  Surgeon: Judeth Horn, MD;  Location: [facility];  Service: General;  Laterality: N/A;  . HIATAL HERNIA REPAIR    . REPAIR VAGINAL CUFF  10/07/2012   Procedure: REPAIR VAGINAL CUFF;  Surgeon: [name], MD PHD;  Location: [facility];  Service: Gynecology;;  . ROBOTIC ASSISTED TOTAL HYSTERECTOMY WITH BILATERAL SALPINGO OOPHERECTOMY  10/07/2012   Procedure: ROBOTIC ASSISTED TOTAL HYSTERECTOMY WITH BILATERAL SALPINGO OOPHORECTOMY;  Surgeon: [name], MD PHD;  Location[facility];  Service: Gynecology;  Laterality: Bilateral;    Past Medical Hx:  Past Medical History:  Diagnosis Date  . Anemia   . Anxiety   . Arthritis    ,Knees, back  . Congenital birth defect    Right hand  . Depression   . Endometrial cancer ([place])   . HLD (hyperlipidemia)    borderline  . Hx of echocardiogram    Echo (9/15): EF 50-55%, normal wall motion, grade 2 diastolic dysfunction, mild LAE  . Hx of radiation therapy 04/07/13- 05/14/13   pelvis 45 gray 25 fx  . Hypertension   . Obesity   . Swelling    ANKLES - TAKES LASIX    Past Gynecological History:  G2 P1 Menarche 56, Patient's last menstrual period was 09/20/2012. No h/o abn pap test  Family Hx:  Family History  Problem Relation Age of Onset  . Diabetes Mother   . Pancreatic cancer Maternal Grandfather     or liver cancer  . Hypertension Maternal Grandfather   . Cancer Maternal Grandfather  pancreatic  . Hypertension Maternal Grandmother   . Colon polyps Maternal Aunt   . Cancer Maternal Aunt     lung  . Colon cancer Cousin   . Cancer Cousin     lung  . Lung cancer Maternal Aunt   . Stroke Cousin   . Heart attack Neg Hx    Mat  aunt lung cancer Dx 68 (tob use) Mat GF pancreatic cancer Mat cousin lung cancer dx 62's (tob use)  Review of Systems:  Constitutional  Feels well, weight gain, no malaise. Cardiovascular  No chest pain, shortness of breath, or edema  Pulmonary  No cough or wheeze.  Gastro Intestinal  No nausea, vomitting, or diarrhoea. No rectal bleeding. Genito Urinary  No frequency, urgency, dysuria,no vaginal spotting or discharge.  Is compliant with the use of her vaginal dilator Musculo Skeletal  No myalgia, left knee arthralgia,  joint swelling and pain   Physical Exam: LMP 09/20/2012  Wt Readings from Last 3 Encounters:  01/03/17 (!) 318 lb 6 oz (144.4 kg)  09/06/16 (!) 311 lb 9.6 oz (141.3 kg)  02/23/16 (!) 315 lb (142.9 kg)  WD in NAD CHEST:  CTA CARDIAC:  RRR ABDOMEN :  Soft nontender. Trocar sites without evidence of mass or hernia. Periumbilical incision consistent with recent cholecystectomy. Normoactive bowel sounds, abdomen soft, non-tender and morbidly obese.  BACK:  No CVAT LN:  No cervical supra clavicular or inguinal adenopathy PELVIC:  Nl EGBUS, Vaginal cuff intact.  No discharge, bleeding or tenderness or cul de sac nodularity. Rectal:   Good tone no masses EXT:  Missing digits on the left hand.  No lower extremity edema or cyanosis.  Assessment/Plan:  [name]  is a 51 y.o.  year old with grade 2 endometrial adenocarcinoma noted on  the endometrial biopsy and endocervical curettage specimens. She underwent robotic total laparoscopic hysterectomy bilateral salpingo-oophorectomy. At the time of surgery diffuse miliary cystic-like lesions were noted in the cul-de-sac on on the rectosigmoid colon highly suspicious for metastatic disease. Because of these findings lymph node dissection was not performed. Final pathology was notable for grade 2 endometrioid endometrial adenocarcinoma with 12.5% myometrial invasion no lymphovascular space invasion no cervical  involvement without confirmation of metastases in the eplic systs.    Received 6 cycles of Taxol/Carboplatin and pelvic XRT sandwich fashion.  Completed 07/24/2013.  F/U with Dr. Jaynie Crumble  04/2016 F/u with Gyn  07/2016 Ms. Chavis was counseled that follow-up with Dr. Hulan Fray with will begin in March 2017 Counseled on the signs and symptoms of recurrence  Obesity Weight loss occurred with the episode of gangrenous cholecystitis. Ms. Davison has a history of rapid weight loss and weight gain we discussed measures to minimum stabilize her weight.

## 2017-02-12 ENCOUNTER — Ambulatory Visit: Payer: Medicaid Other | Admitting: Gynecologic Oncology

## 2017-02-18 ENCOUNTER — Other Ambulatory Visit: Payer: Self-pay | Admitting: Family Medicine

## 2017-02-18 DIAGNOSIS — Z1231 Encounter for screening mammogram for malignant neoplasm of breast: Secondary | ICD-10-CM

## 2017-02-19 ENCOUNTER — Other Ambulatory Visit (HOSPITAL_COMMUNITY): Payer: Medicaid Other

## 2017-02-22 ENCOUNTER — Other Ambulatory Visit (HOSPITAL_COMMUNITY): Payer: Medicaid Other

## 2017-02-25 ENCOUNTER — Ambulatory Visit: Payer: Medicaid Other | Attending: Gynecologic Oncology | Admitting: Gynecologic Oncology

## 2017-02-25 ENCOUNTER — Encounter: Payer: Self-pay | Admitting: Gynecologic Oncology

## 2017-02-25 VITALS — BP 104/76 | HR 65 | Temp 97.9°F | Resp 20

## 2017-02-25 DIAGNOSIS — T6291XA Toxic effect of unspecified noxious substance eaten as food, accidental (unintentional), initial encounter: Secondary | ICD-10-CM | POA: Diagnosis not present

## 2017-02-25 DIAGNOSIS — K521 Toxic gastroenteritis and colitis: Secondary | ICD-10-CM

## 2017-02-25 DIAGNOSIS — Z923 Personal history of irradiation: Secondary | ICD-10-CM

## 2017-02-25 DIAGNOSIS — Z8542 Personal history of malignant neoplasm of other parts of uterus: Secondary | ICD-10-CM | POA: Diagnosis not present

## 2017-02-25 DIAGNOSIS — E669 Obesity, unspecified: Secondary | ICD-10-CM | POA: Diagnosis not present

## 2017-02-25 DIAGNOSIS — C541 Malignant neoplasm of endometrium: Secondary | ICD-10-CM | POA: Insufficient documentation

## 2017-02-25 DIAGNOSIS — Z9221 Personal history of antineoplastic chemotherapy: Secondary | ICD-10-CM

## 2017-02-25 DIAGNOSIS — R197 Diarrhea, unspecified: Secondary | ICD-10-CM | POA: Diagnosis not present

## 2017-02-25 DIAGNOSIS — IMO0001 Reserved for inherently not codable concepts without codable children: Secondary | ICD-10-CM

## 2017-02-25 DIAGNOSIS — T628X1A Toxic effect of other specified noxious substances eaten as food, accidental (unintentional), initial encounter: Secondary | ICD-10-CM

## 2017-02-25 IMAGING — CR DG CHEST 2V
2 series · 2 of 2 positions shown · non-contrast
Comparison: 10/19/2014.

CLINICAL DATA: Chest and abdominal pain since this morning. Pending
cholecystectomy.

EXAM:
CHEST  2 VIEW

[w chest pa]
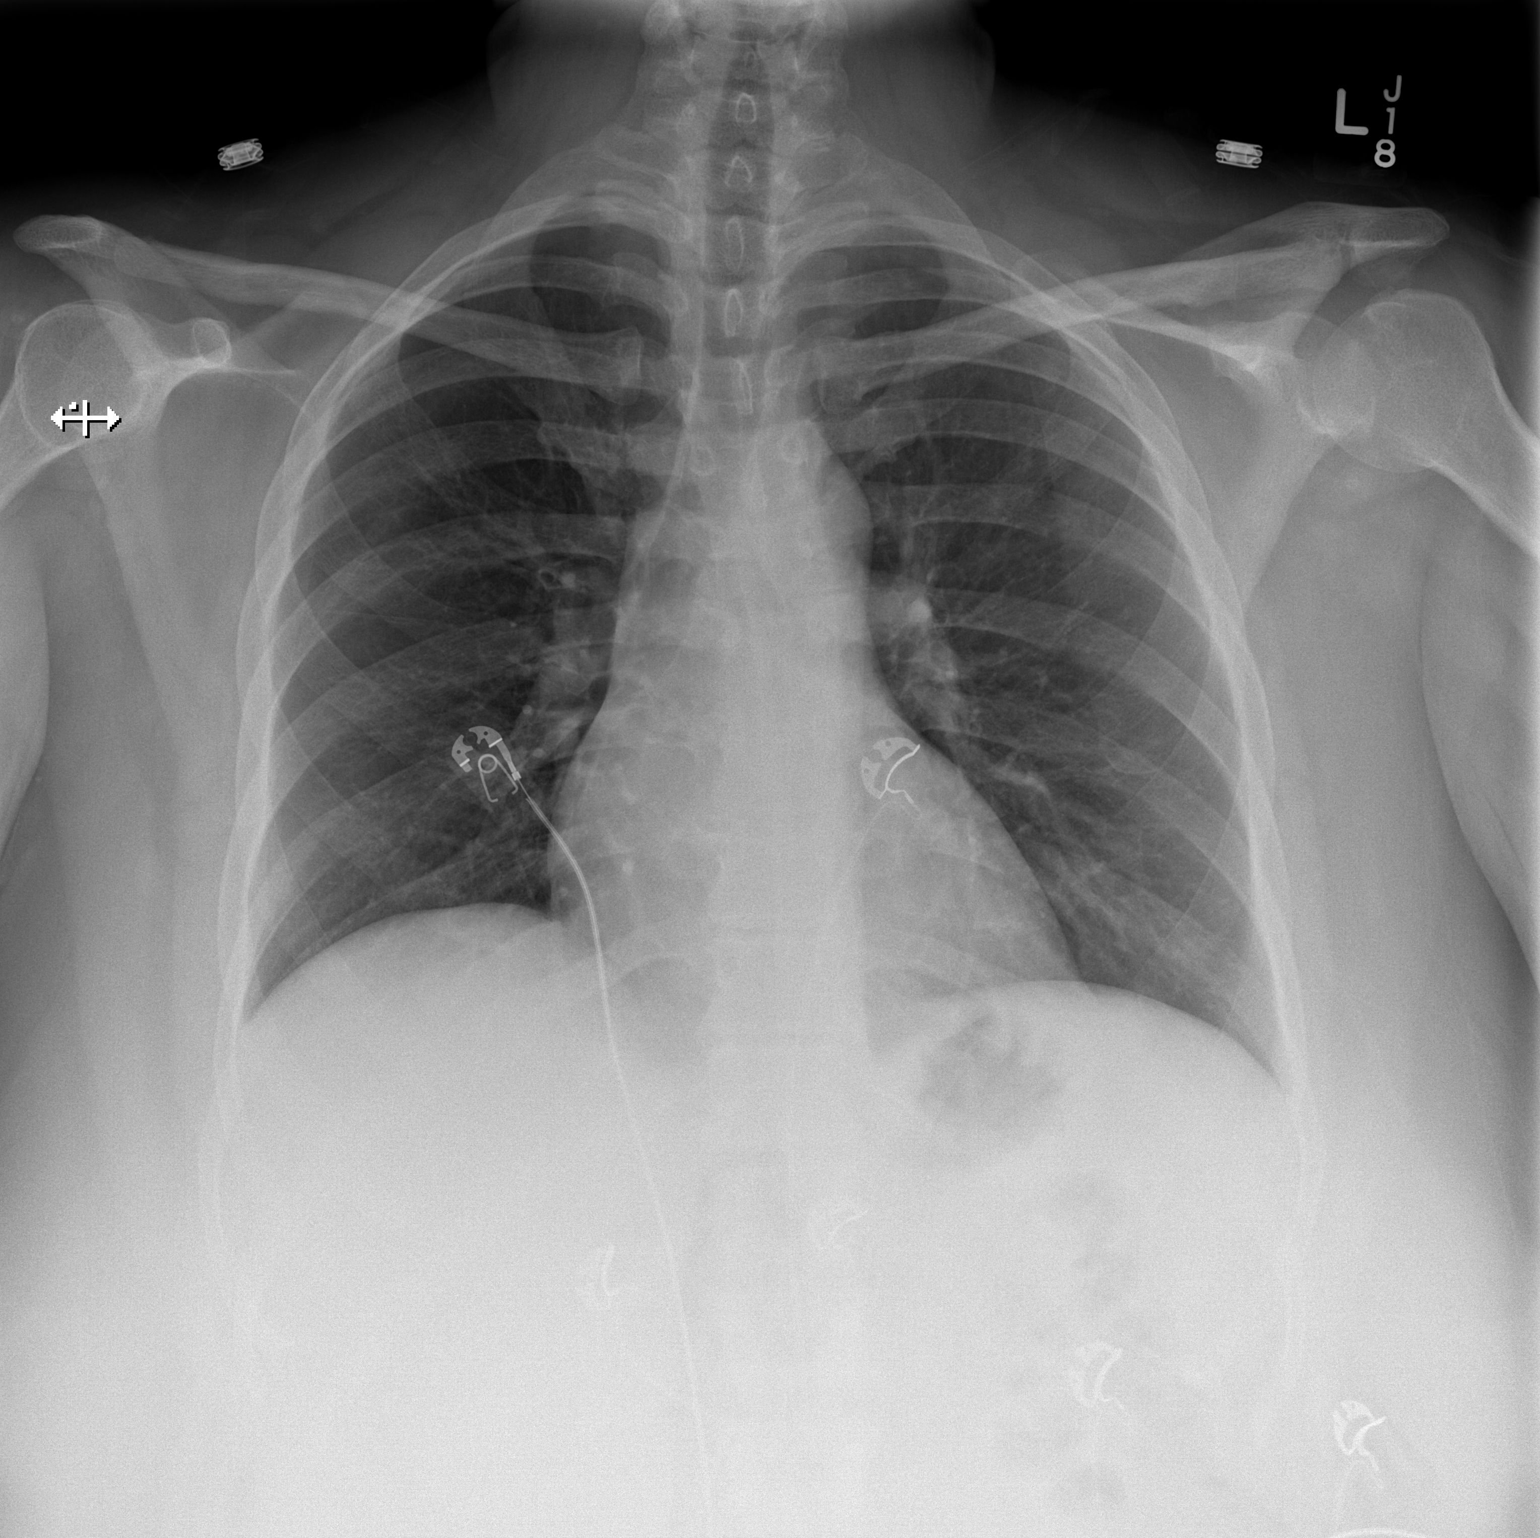

[w chest lat]
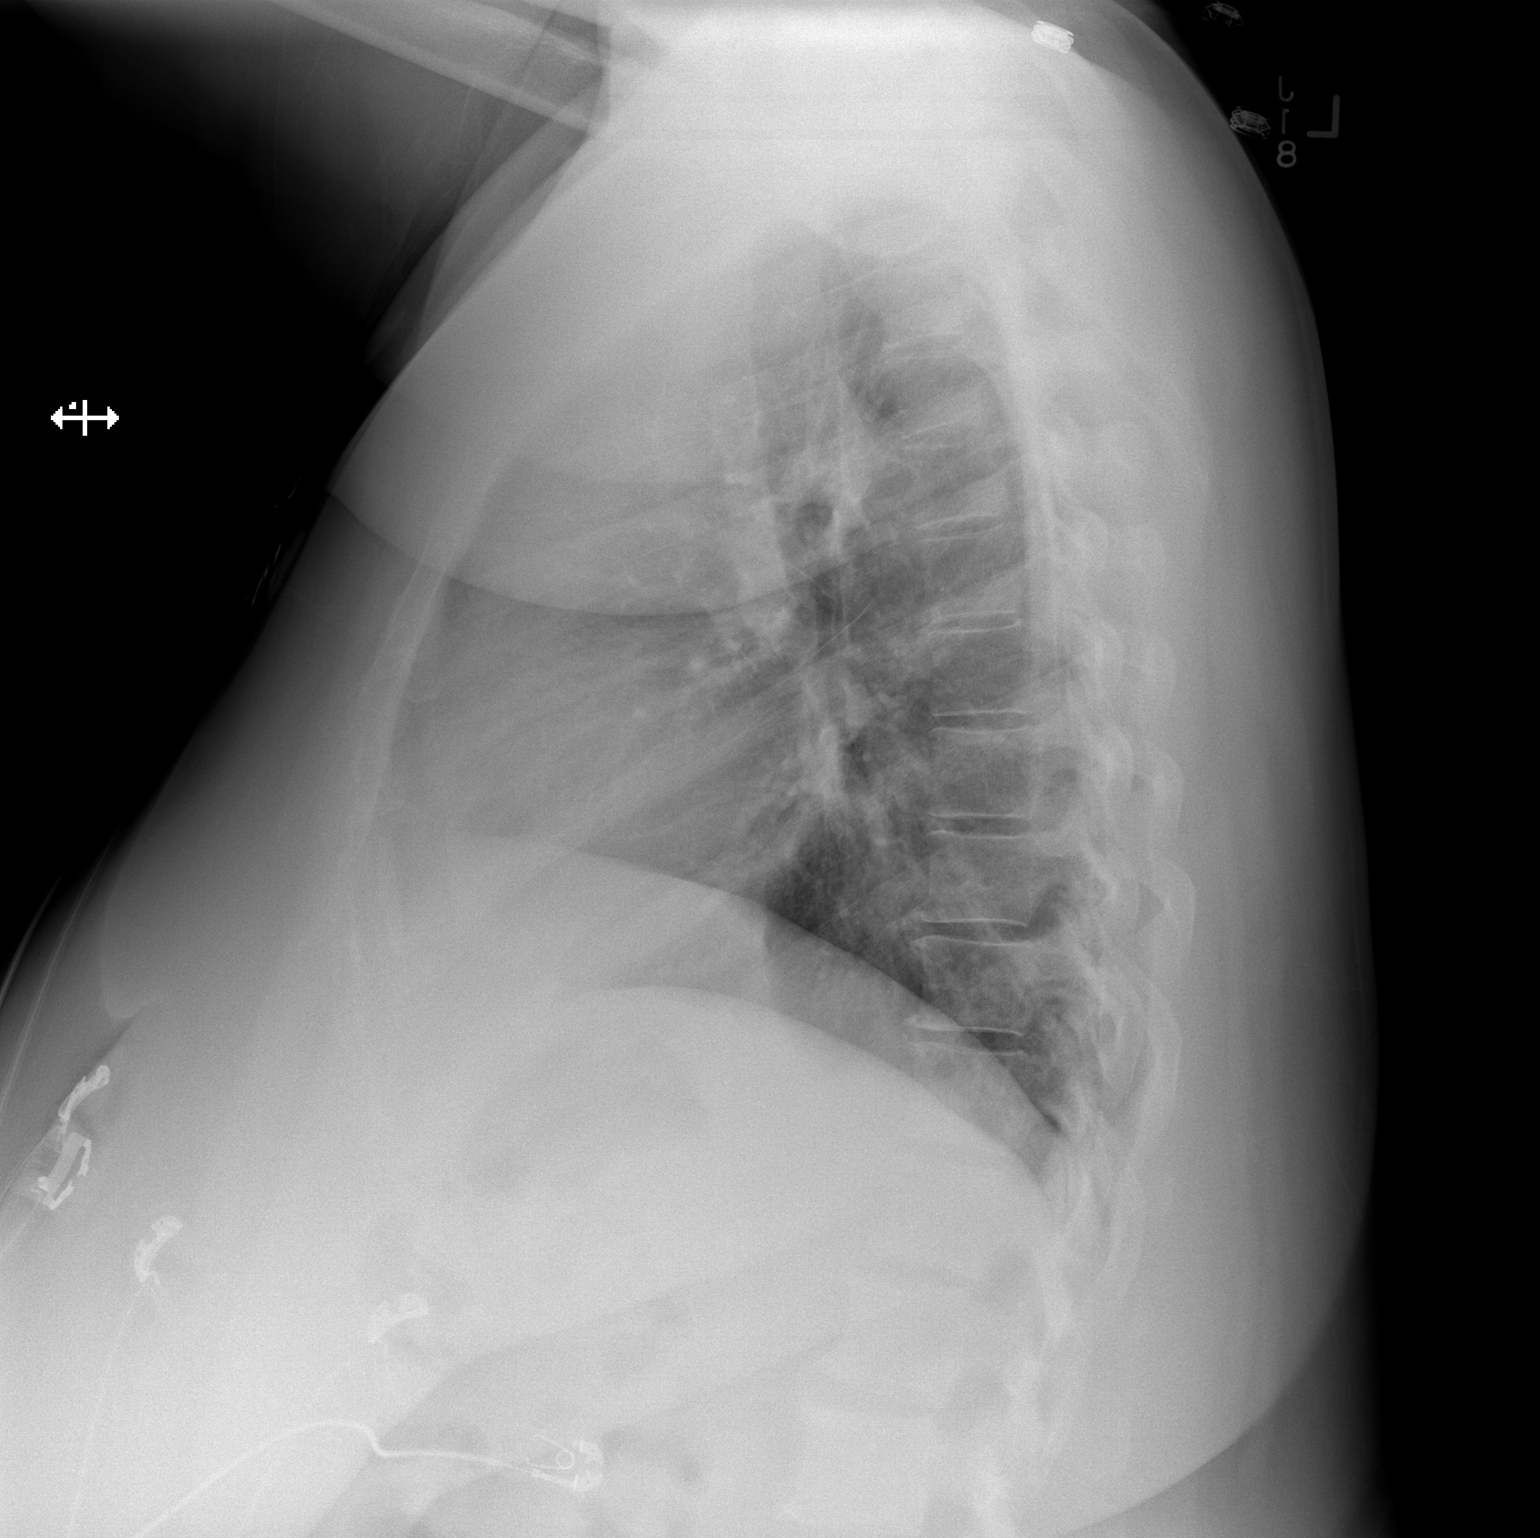

[2 of 2 positions shown; findings below may reference images not displayed]

FINDINGS: Normal sized heart. Clear lungs. Mild central peribronchial
thickening. Mild scoliosis. Mild thoracic spine degenerative
changes.
IMPRESSION: Mild bronchitic changes with mild progression.

## 2017-02-25 NOTE — Progress Notes (Signed)
Office Visit:  GYN ONCOLOGY   CC: Endometrial cancer surveillance, diarrhea  HPI: 51 y.o.  G2 P1 LNMP  06/2012.  Deborah [name] was referred to Dr. Hulan Fray who collected an endometrial biopsy and a endocervical biopsy. Both were positive for endometrial adenocarcinoma.  On 10/07/2012 she underwent robotic hysterectomy bilateral salpingo-oophorectomy. At the time of surgery multiple cystic lesions were noted throughout the entire pelvis that was suspicious for metastatic disease, as such lymph node dissection was not collected but these lesions were biopsied. Final pathology was notable for  1. Soft tissue, biopsy, right para-colic gutter - BENIGN PARA-COLIC GUTTER SOFT TISSUE WITH BENIGN CYST (4.0 CM). SEE COMMENT. - NEGATIVE FOR MALIGNANCY. 2. Uterus +/- tubes/ovaries, neoplastic - ENDOMETRIAL ADENOCARCINOMA, ENDOMETRIOID-TYPE, SEE COMMENT. - TUMOR INVADES LESS THAN 1/2 OF MYOMETRIAL THICKNESS.- NO LYMPHOVASCULAR INVASION IDENTIFIED. - TUMOR INVADES INTO LOWER UTERINE SEGMENT. - UTERINE ADENOMYOSIS. - BENIGN RIGHT AND LEFT OVARIES; NO ATYPIA OR MALIGNANCY PRESENT. - BENIGN RIGHT AND LEFT FALLOPIAN TUBES WITH BENIGN SEROUS-TYPE PARATUBAL CYSTS. - BENIGN CERVIX; NEGATIVE FOR INTRAEPITHELIAL LESION OR MALIGNANCY. - BENIGN MULTILOCULAR UTERINE SEROSAL CYST.  CT abdomen and pelvis 11/25/2012  IMPRESSION:  1. Shotty bilateral external iliac lymphadenopathy measuring up to 1.3 cm, suspicious for metastatic disease. Nonspecific less than 5 mm retroperitoneal lymph nodes also noted in the left para-aortic region. 2. 2.7 cm postop lymphocele versus low attenuation lymphadenopathy in the proximal left external iliac chain. 3. Cholelithiasis and right nephrolithiasis incidentally noted.   End of treatment CT 08/25/2013 notable for no evidence of abdominal lymphadenopathy. There Is no evidence of inflammatory process abscess or ascites the adnexal regions are unremarkable in appearance there is no new or  progressive disease within the abdomen or pelvis  Mammogram 01/2015 wnl Pap 05/2015  No dysplasia, BV  reports diarrhea and abdominal discomfort since this am.  Ate wings last evening.  No emesis, mild nausea.  No vaginal bleeding, no cough.  Excellent appetite   Past Surgical Hx:  Past Surgical History:  Procedure Laterality Date  . ABDOMINAL HYSTERECTOMY  10/2011   complete  . CHOLECYSTECTOMY N/A 07/08/2015   Procedure: LAPAROSCOPIC CHOLECYSTECTOMY WITH INTRAOPERATIVE CHOLANGIOGRAM;  Surgeon: Judeth Horn, MD;  Location: [facility];  Service: General;  Laterality: N/A;  . HIATAL HERNIA REPAIR    . REPAIR VAGINAL CUFF  10/07/2012   Procedure: REPAIR VAGINAL CUFF;  Surgeon: [name], MD PHD;  Location[facility];  Service: Gynecology;;  . ROBOTIC ASSISTED TOTAL HYSTERECTOMY WITH BILATERAL SALPINGO OOPHERECTOMY  10/07/2012   Procedure: ROBOTIC ASSISTED TOTAL HYSTERECTOMY WITH BILATERAL SALPINGO OOPHORECTOMY;  Surgeon: [name], MD PHD;  Location: [facility];  Service: Gynecology;  Laterality: Bilateral;    Past Medical Hx:  Past Medical History:  Diagnosis Date  . Anemia   . Anxiety   . Arthritis    ,Knees, back  . Congenital birth defect    Right hand  . Depression   . Endometrial cancer ([place])   . HLD (hyperlipidemia)    borderline  . Hx of echocardiogram    Echo (9/15): EF 50-55%, normal wall motion, grade 2 diastolic dysfunction, mild LAE  . Hx of radiation therapy 04/07/13- 05/14/13   pelvis 45 gray 25 fx  . Hypertension   . Obesity   . Swelling    ANKLES - TAKES LASIX    Past Gynecological History:  G2 P1 Menarche 80, Patient's last menstrual period was 09/20/2012. No h/o abn pap test  Family Hx:  Family History  Problem Relation Age of Onset  .  Diabetes Mother   . Pancreatic cancer Maternal Grandfather     or liver cancer  . Hypertension Maternal Grandfather   . Cancer Maternal Grandfather     pancreatic  . Hypertension Maternal Grandmother   . Colon polyps  Maternal Aunt   . Cancer Maternal Aunt     lung  . Colon cancer Cousin   . Cancer Cousin     lung  . Lung cancer Maternal Aunt   . Stroke Cousin   . Heart attack Neg Hx    Mat aunt lung cancer Dx 22 (tob use) Mat GF pancreatic cancer Mat cousin lung cancer dx 77's (tob use)  Review of Systems: Constitutional  Feels well, weight gain, no malaise. No fever Cardiovascular  No chest pain, shortness of breath, or edema  Pulmonary  No cough or wheeze.  Gastro Intestinal  Mild nausea, no vomitting, three episodes of diarrhea today. No rectal bleeding. Genito Urinary  No frequency, urgency, dysuria,no vaginal spotting or discharge. Musculo Skeletal  No myalgia, left knee arthralgia,  joint swelling and pain   Physical Exam: WD in NAD CHEST:  CTA CARDIAC:  RRR ABDOMEN :  Soft nontender. Trocar sites without evidence of mass or hernia. Periumbilical incision consistent with recent cholecystectomy. Normoactive bowel sounds, abdomen soft, non-tender and morbidly obese.  BACK:  No CVAT LN:  No cervical supra clavicular or inguinal adenopathy PELVIC:  Nl EGBUS, Vaginal cuff intact.  No discharge, bleeding or tenderness or cul de sac nodularity. EXT:  Missing digits on the left hand.  No lower extremity edema or cyanosis.  Assessment/Plan:  Deborah [name]  is a 51 y.o.  year old with grade 2 endometrial adenocarcinoma noted on  the endometrial biopsy and endocervical curettage specimens. She underwent robotic total laparoscopic hysterectomy bilateral salpingo-oophorectomy. At the time of surgery diffuse miliary cystic-like lesions were noted in the cul-de-sac on on the rectosigmoid colon highly suspicious for metastatic disease. Because of these findings lymph node dissection was not performed. Final pathology was notable for grade 2 endometrioid endometrial adenocarcinoma with 12.5% myometrial invasion no lymphovascular space invasion no cervical involvement.  Received 6 cycles of  Taxol/Carboplatin and pelvic XRT sandwich fashion.  Completed 07/24/2013. F/u with Gyn  02/2018 F/U with Dr. Sondra Come 08/2017   Obesity Weight loss occurred with the episode of gangrenous cholecystitis. Deborah [name] has a history of rapid weight loss and weight gain we discussed measures to minimum stabilize her weight.  Food poisoning Advised to hydrate with clear liquids,  Gatorade or co-co nut water  If this is not self limiting will contact us for additional instructions. Advised to inform the restaurant of this occurrence

## 2017-02-25 NOTE — Patient Instructions (Signed)
Follow up with  Dr Sondra Come in 6 months and Dr Skeet Latch in one year ( April 2019)

## 2017-02-27 ENCOUNTER — Other Ambulatory Visit: Payer: Self-pay | Admitting: Radiation Oncology

## 2017-02-27 ENCOUNTER — Telehealth: Payer: Self-pay | Admitting: Oncology

## 2017-02-27 DIAGNOSIS — C541 Malignant neoplasm of endometrium: Secondary | ICD-10-CM

## 2017-02-27 MED ORDER — DIAZEPAM 5 MG PO TABS: 5.0000 mg | ORAL_TABLET | Freq: Three times a day (TID) | ORAL | 3 refills | Status: DC | PRN

## 2017-02-27 NOTE — Telephone Encounter (Signed)
Patient called and requested a refill on diazepam.  She last had it filled on 11/07/16 with 3 refills.  Her next follow up appointment is on 09/12/17.

## 2017-02-27 NOTE — Telephone Encounter (Signed)
Called in diazepam refill to [facility].  Left a voicemail for patient letting her know it has been called in.

## 2017-03-01 ENCOUNTER — Telehealth (HOSPITAL_COMMUNITY): Payer: Self-pay | Admitting: *Deleted

## 2017-03-01 NOTE — Telephone Encounter (Signed)
Left message on voicemail in reference to upcoming appointment scheduled for 03/05/17. Phone number given for a call back so details instructions can be given. Deborah [name] ° ° °

## 2017-03-05 ENCOUNTER — Ambulatory Visit (HOSPITAL_COMMUNITY): Payer: Medicaid Other

## 2017-03-05 ENCOUNTER — Ambulatory Visit (HOSPITAL_COMMUNITY): Payer: Medicaid Other | Attending: Internal Medicine

## 2017-03-05 DIAGNOSIS — R072 Precordial pain: Secondary | ICD-10-CM | POA: Insufficient documentation

## 2017-03-05 MED ORDER — DOBUTAMINE INFUSION FOR EP/ECHO/NUC (1000 MCG/ML)
30.0000 ug/kg/min | Freq: Once | INTRAVENOUS | Status: AC
  Administered 2017-03-05: 30 ug/kg/min via INTRAVENOUS

## 2017-03-14 ENCOUNTER — Ambulatory Visit: Payer: Self-pay | Admitting: Radiation Oncology

## 2017-03-15 ENCOUNTER — Ambulatory Visit: Payer: Medicaid Other

## 2017-03-18 ENCOUNTER — Telehealth: Payer: Self-pay

## 2017-03-18 MED ORDER — OMEPRAZOLE 20 MG PO CPDR: 20.0000 mg | DELAYED_RELEASE_CAPSULE | Freq: Two times a day (BID) | ORAL | 3 refills | Status: DC

## 2017-03-18 NOTE — Telephone Encounter (Signed)
Rx refilled.

## 2017-03-18 NOTE — Telephone Encounter (Signed)
Pt calling for refill on omeprazole. Ottis Stain, CMA

## 2017-03-19 IMAGING — RF DG CHOLANGIOGRAM OPERATIVE
1 series · 4 of 4 positions shown · non-contrast
Comparison: Ultrasound on 06/17/2015

CLINICAL DATA: Cholecystectomy for symptomatic cholelithiasis.

EXAM:
INTRAOPERATIVE CHOLANGIOGRAM
TECHNIQUE: Cholangiographic images from the C-arm fluoroscopic device were
submitted for interpretation post-operatively. Please see the
procedural report for the amount of contrast and the fluoroscopy
time utilized.

[Series 1: run · 4 of 51 frames shown]
[frame 8/51]
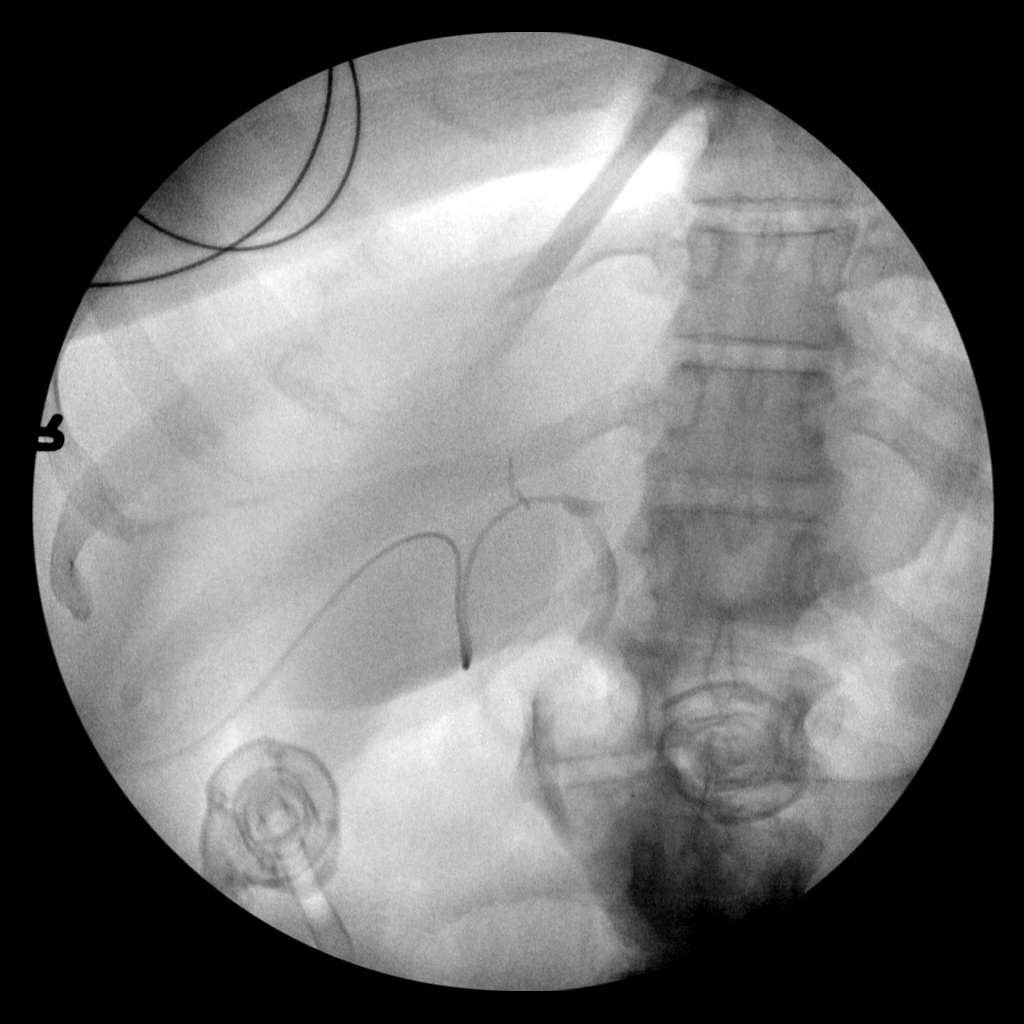
[frame 26/51]
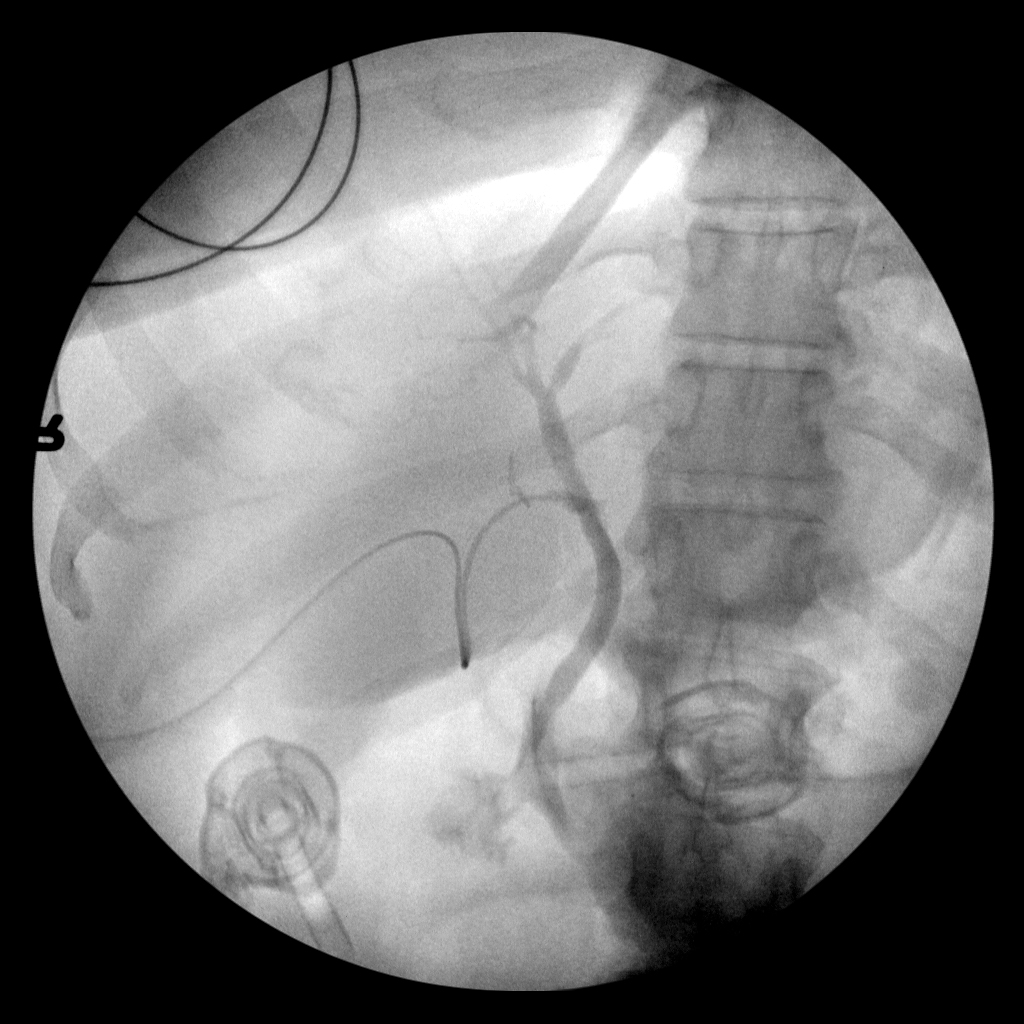
[frame 44/51]
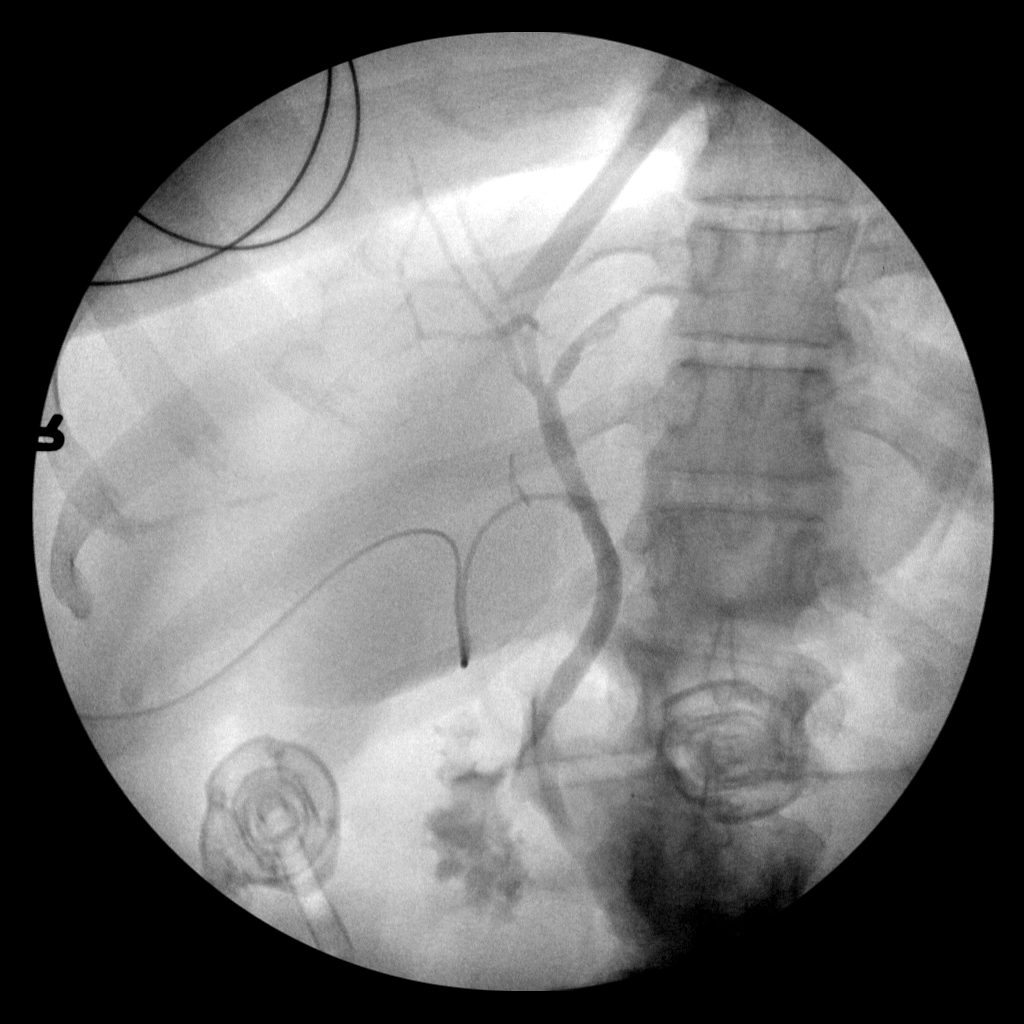
[frame 51/51]
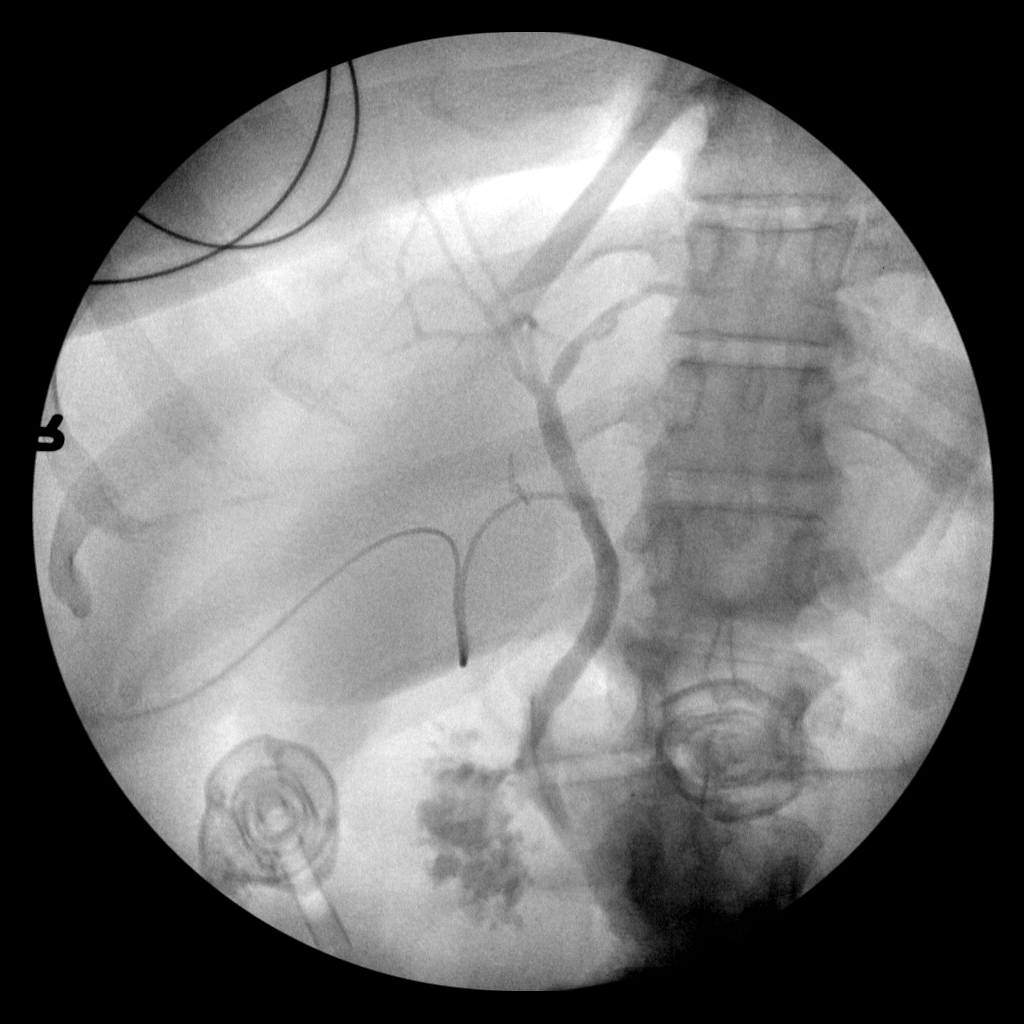

[4 of 4 positions shown; findings below may reference images not displayed]

FINDINGS: Intraoperative imaging with a C-arm demonstrates a normal opacified
biliary tree without evidence of biliary dilatation, filling defects
or obstruction. Contrast enters the duodenum normally. No contrast
extravasation is identified.
IMPRESSION: Normal intraoperative cholangiogram.

## 2017-04-26 ENCOUNTER — Ambulatory Visit: Payer: Medicaid Other

## 2017-05-22 ENCOUNTER — Telehealth: Payer: Self-pay | Admitting: Cardiology

## 2017-05-22 MED ORDER — CARVEDILOL 3.125 MG PO TABS: ORAL_TABLET | ORAL | 2 refills | Status: DC

## 2017-05-22 NOTE — Telephone Encounter (Signed)
Rx sent electronically to preferred pharmacy

## 2017-05-22 NOTE — Telephone Encounter (Signed)
New message    *STAT* If patient is at the pharmacy, call can be transferred to refill team.   1. Which medications need to be refilled? (please list name of each medication and dose if known)carvedilol (COREG) 3.125 MG tablet  2. Which pharmacy/location (including street and city if local pharmacy) is medication to be sent to? [address] [facility]  3. Do they need a 30 day or 90 day supply? 90 day supply

## 2017-07-09 ENCOUNTER — Telehealth: Payer: Self-pay | Admitting: Oncology

## 2017-07-09 DIAGNOSIS — C541 Malignant neoplasm of endometrium: Secondary | ICD-10-CM

## 2017-07-09 MED ORDER — DIAZEPAM 5 MG PO TABS: 5.0000 mg | ORAL_TABLET | Freq: Three times a day (TID) | ORAL | 3 refills | Status: DC | PRN

## 2017-07-09 NOTE — Telephone Encounter (Signed)
Called in refill to [facility] per Dr. Sondra Come.  Left a message for patient letting her know it has been called in.

## 2017-07-09 NOTE — Addendum Note (Signed)
Addended by: Elmo Putt R on: 07/09/2017 03:04 PM   Modules accepted: Orders

## 2017-07-09 NOTE — Telephone Encounter (Signed)
Patient left a message asking for a refill on diazepam.  She last had it filled on 02/27/17 with 3 refills.

## 2017-07-25 ENCOUNTER — Telehealth: Payer: Self-pay

## 2017-07-25 DIAGNOSIS — C541 Malignant neoplasm of endometrium: Secondary | ICD-10-CM

## 2017-07-25 MED ORDER — POTASSIUM CHLORIDE CRYS ER 10 MEQ PO TBCR: 20.0000 meq | EXTENDED_RELEASE_TABLET | Freq: Two times a day (BID) | ORAL | 0 refills | Status: DC

## 2017-07-25 NOTE — Telephone Encounter (Signed)
Patient called and said she needs enough potassium to get her through until her appointment on 08/16/2017.Deborah [name]

## 2017-07-25 NOTE — Telephone Encounter (Signed)
Rx sent 

## 2017-08-15 NOTE — Progress Notes (Deleted)
   [facility] Phone: [phone]   Date of Visit: 08/16/2017   HPI:  ***  ROS: See HPI.  [place]:  PMH: Hx of Endometrial Cancer  GER OA of bilateral knees Chronic Back Pain Obesity Anemia Depression/Anxiety Reduced EF   PHYSICAL EXAM: LMP 09/20/2012  Gen: *** HEENT: *** Heart: *** Lungs: *** Neuro: *** Ext: ***  ASSESSMENT/PLAN:  Health maintenance:  -***  No problem-specific Assessment & Plan notes found for this encounter.  FOLLOW UP: Follow up in *** for ***  Smiley Houseman, MD PGY [place]

## 2017-08-16 ENCOUNTER — Ambulatory Visit: Payer: Self-pay | Admitting: Internal Medicine

## 2017-09-04 ENCOUNTER — Other Ambulatory Visit: Payer: Self-pay | Admitting: Internal Medicine

## 2017-09-04 ENCOUNTER — Telehealth: Payer: Self-pay

## 2017-09-04 DIAGNOSIS — C541 Malignant neoplasm of endometrium: Secondary | ICD-10-CM

## 2017-09-04 NOTE — Telephone Encounter (Signed)
Patient called and said that she needs enough Potassium  pills to last until her next appointment on 09/18/2017 at 1430.  She was sick in the past so she missed her last appointment but guarantees that she will not miss the next one.Deborah [name]

## 2017-09-04 NOTE — Telephone Encounter (Signed)
Rx sent 

## 2017-09-12 ENCOUNTER — Ambulatory Visit
Admission: RE | Admit: 2017-09-12 | Discharge: 2017-09-12 | Disposition: A | Discharge status: Home / Self Care | Payer: Medicaid Other | Source: Ambulatory Visit | Attending: Radiation Oncology | Admitting: Radiation Oncology

## 2017-09-12 DIAGNOSIS — Z7982 Long term (current) use of aspirin: Secondary | ICD-10-CM | POA: Insufficient documentation

## 2017-09-12 DIAGNOSIS — Z6841 Body Mass Index (BMI) 40.0 and over, adult: Secondary | ICD-10-CM | POA: Insufficient documentation

## 2017-09-12 DIAGNOSIS — Z8542 Personal history of malignant neoplasm of other parts of uterus: Secondary | ICD-10-CM | POA: Insufficient documentation

## 2017-09-12 DIAGNOSIS — Z79899 Other long term (current) drug therapy: Secondary | ICD-10-CM | POA: Insufficient documentation

## 2017-09-12 DIAGNOSIS — Z08 Encounter for follow-up examination after completed treatment for malignant neoplasm: Secondary | ICD-10-CM | POA: Insufficient documentation

## 2017-09-17 NOTE — Progress Notes (Signed)
[facility] Phone: [phone]   Date of Visit: 09/18/2017   HPI:  Patient is here for medication refills. She established care with our clinic on 12/2015 but has not follow up until today. She also reports of generalized abdominal pain and HA with dizziness.   She reports that she needs refills on Coreg, Lasix, Ibuprofen, Nasonex, Prilosec, Potassium, Zoloft.  Reports she takes Lasix as needed but has not been on this medication "for a minute". Reports that she has a history of low potassium and magnesium. Per chart review, it was thought to possibly be due to lasix and/or diarrhea she was having. She reports that she has needed supplements due to the chemotherapy for endometrial cancer.   Abdominal Pain: - she describes it as a discomfort, not pain - reports it is diffuse on her abdomen and the location changes. Sometimes it is on the left side, sometimes right, and sometimes in the epigastric region - she has been having this since she had her gall bladder removal in 06/2015 - she denies fevers, chills, nausea/vomiting. Reports of soft bowel movements; denies melena, blood in stool   - denies urinary symptoms or vaginal bleeding - not the best historian; is not able to note aggravating or alleviating factors. Unable to tell me if it is related to food. - denies any weight loss. - does have a history of endometrial cancer in 2013 s/p hysterectomy and salpingo oophorectomy, with chemo and radiation. She is still closely followed by GYN/ONC.    Headache:  - she reports of having intermittent headaches "for a minute".  The location varies. She describes it as a pounding sensation. Sometimes she has a headache when she wakes up in the morning. The headaches do not wake her up from sleep.  - she does snore and does have pauses in breathing. She reports that she was supposed to have a sleep study done but this was not done because she had to get gall bladder surgery.    - she does have allergies which she feels are not controlled. She has watery, runny eyes, itchy nose, sometimes sore throat. She has been out of Nasonex for a while  - tylenol helps a little  - per chart review, she mentioned this at her initial visit to establish care  Intermittent Dizziness:  - she reports of intermittent dizziness for the past few months.  - she wondered if this was due to not eating much that particular day  - she sometimes feels off balance  - she is unable to tell me if she feels lightheaded or if her surroundings are spinning - reports it comes out of the blue but then when asked states that it does occur when going from sitting to standing position - it occurs about 3-4 times a week - the duration of symptoms varies and she could not give me an estimate or range of duration for this.  - no falls  ROS: See HPI.  [place]:  PMH: Hot Flashes Gastroesophageal Reflux  Constipation Cholelithiases OA of Bilateral Knees Endometrial Cancer Anxiety and Depression Lumbar Back Pain  Obesity Ventricular Dysfunction of heart   PHYSICAL EXAM: BP 132/80   Pulse 97   Temp 98.4 F (36.9 C) (Oral)   Wt (!) 322 lb (146.1 kg)   LMP 09/20/2012   SpO2 98%   BMI 51.97 kg/m  GEN: NAD, obese HEENT: Atraumatic, normocephalic, neck supple, EOMI, sclera clear  CV: RRR, no murmurs, rubs, or gallops  PULM: CTAB, normal effort ABD: Soft, mild discomfort with palpation in all quadrants but no guarding or rebond, nondistended, NABS, no organomegaly SKIN: No rash or cyanosis; warm and well-perfused EXTR: No lower extremity edema or calf tenderness; right hand with congenital abnormality  PSYCH: Mood and affect euthymic, normal rate and volume of speech NEURO: Awake, alert, no focal deficits grossly, normal speech   ASSESSMENT/PLAN:  Health maintenance:  - declines flu and tdap vaccines  - patient knows to make an appointment for mammogram (ordered as well)   Hypokalemia:  history of: unclear as to the etiology for this, but per chart review it may have been due to diarrhea from chemo? Vs lasix use. She tells me that she only takes Lasix PRN. If her K is normal on repeat BMP and since she no longer has diarrhea, I think it would be reasonable to do a trial off potassium chloride.  - Basic metabolic panel - potassium chloride (K-DUR,KLOR-CON) 10 MEQ tablet; Take 2 tablets (20 mEq total) by mouth 2 (two) times daily.  Dispense: 120 tablet; Refill: 0   Hypomagnesemia: history of  - Magnesium   Elevated cholesterol - Lipid panel; Future  Nonintractable episodic headache, unspecified headache type No red flags, however, difficult to categorize with the given history. Possibly related to her snoring as she has symptoms of OSA. Possibly related to allergies.  - sleep study - refilled nasonex. Patient is not interested in starting an oral anti-histamine in addition to nasonex - patient to keep a HA diary and follow up in 4 weeks (or sooner if symptoms worsen)  Chronic abdominal pain Unclear in etiology from the history given today. No red flags. No signs of acute abdomen or an infectious process. She is tolerating diet and having normal BMS. More information is needed to determine etiology. I have asked her to keep a diary and follow up in 4 weeks (or sooner if symptoms worsen).  - will also add LFTs and CBC and lipase to labs; future  Screening for breast cancer - MS DIGITAL SCREENING BILATERAL; Future   Smiley Houseman, MD PGY [employer]

## 2017-09-18 ENCOUNTER — Encounter: Payer: Self-pay | Admitting: Internal Medicine

## 2017-09-18 ENCOUNTER — Ambulatory Visit (INDEPENDENT_AMBULATORY_CARE_PROVIDER_SITE_OTHER): Payer: Medicaid Other | Admitting: Internal Medicine

## 2017-09-18 VITALS — BP 132/80 | HR 97 | Temp 98.4°F | Wt 322.0 lb

## 2017-09-18 DIAGNOSIS — E876 Hypokalemia: Secondary | ICD-10-CM | POA: Diagnosis present

## 2017-09-18 DIAGNOSIS — R51 Headache: Secondary | ICD-10-CM

## 2017-09-18 DIAGNOSIS — R109 Unspecified abdominal pain: Secondary | ICD-10-CM | POA: Diagnosis not present

## 2017-09-18 DIAGNOSIS — Z1231 Encounter for screening mammogram for malignant neoplasm of breast: Secondary | ICD-10-CM

## 2017-09-18 DIAGNOSIS — G8929 Other chronic pain: Secondary | ICD-10-CM

## 2017-09-18 DIAGNOSIS — R0683 Snoring: Secondary | ICD-10-CM

## 2017-09-18 DIAGNOSIS — I519 Heart disease, unspecified: Secondary | ICD-10-CM

## 2017-09-18 DIAGNOSIS — R519 Headache, unspecified: Secondary | ICD-10-CM

## 2017-09-18 DIAGNOSIS — Z8542 Personal history of malignant neoplasm of other parts of uterus: Secondary | ICD-10-CM

## 2017-09-18 DIAGNOSIS — Z1239 Encounter for other screening for malignant neoplasm of breast: Secondary | ICD-10-CM

## 2017-09-18 DIAGNOSIS — E7800 Pure hypercholesterolemia, unspecified: Secondary | ICD-10-CM | POA: Diagnosis not present

## 2017-09-18 MED ORDER — CARVEDILOL 3.125 MG PO TABS: ORAL_TABLET | ORAL | 2 refills | Status: DC

## 2017-09-18 MED ORDER — FUROSEMIDE 20 MG PO TABS: 20.0000 mg | ORAL_TABLET | Freq: Every day | ORAL | 0 refills | Status: DC | PRN

## 2017-09-18 MED ORDER — SERTRALINE HCL 100 MG PO TABS: 100.0000 mg | ORAL_TABLET | Freq: Every evening | ORAL | 1 refills | Status: DC

## 2017-09-18 MED ORDER — MOMETASONE FUROATE 50 MCG/ACT NA SUSP: 2.0000 | Freq: Every day | NASAL | 5 refills | Status: DC

## 2017-09-18 MED ORDER — IBUPROFEN 800 MG PO TABS: 800.0000 mg | ORAL_TABLET | Freq: Three times a day (TID) | ORAL | 0 refills | Status: DC | PRN

## 2017-09-18 MED ORDER — POTASSIUM CHLORIDE CRYS ER 10 MEQ PO TBCR: 20.0000 meq | EXTENDED_RELEASE_TABLET | Freq: Two times a day (BID) | ORAL | 0 refills | Status: DC

## 2017-09-18 MED ORDER — OMEPRAZOLE 20 MG PO CPDR: 20.0000 mg | DELAYED_RELEASE_CAPSULE | Freq: Every day | ORAL | 0 refills | Status: DC

## 2017-09-18 NOTE — Patient Instructions (Addendum)
Let's get a sleep study  Restart Nasonex  Please keep a headache diary Please keep a diary of your abdominal discomfort.  Follow up in 4 weeks  We will do labs today/

## 2017-09-19 DIAGNOSIS — I519 Heart disease, unspecified: Secondary | ICD-10-CM | POA: Insufficient documentation

## 2017-09-19 LAB — BASIC METABOLIC PANEL
BUN/Creatinine Ratio: 13 (ref 9–23)
BUN: 13 mg/dL (ref 6–24)
CHLORIDE: 99 mmol/L (ref 96–106)
CO2: 27 mmol/L (ref 20–29)
Calcium: 9 mg/dL (ref 8.7–10.2)
Creatinine, Ser: 1.04 mg/dL — ABNORMAL HIGH (ref 0.57–1.00)
GFR, EST AFRICAN AMERICAN: 73 mL/min/{1.73_m2} (ref >59)
GFR, EST NON AFRICAN AMERICAN: 63 mL/min/{1.73_m2} (ref >59)
Glucose: 98 mg/dL (ref 65–99)
POTASSIUM: 4.5 mmol/L (ref 3.5–5.2)
SODIUM: 141 mmol/L (ref 134–144)

## 2017-09-19 LAB — MAGNESIUM: Magnesium: 1.4 mg/dL — ABNORMAL LOW (ref 1.6–2.3)

## 2017-09-23 ENCOUNTER — Other Ambulatory Visit: Payer: Self-pay | Admitting: Internal Medicine

## 2017-09-23 ENCOUNTER — Telehealth: Payer: Self-pay | Admitting: *Deleted

## 2017-09-23 MED ORDER — FLUTICASONE PROPIONATE 50 MCG/ACT NA SUSP: 2.0000 | Freq: Every day | NASAL | 0 refills | Status: DC

## 2017-09-23 NOTE — Telephone Encounter (Signed)
Does not qualify for PA completion. Prescribed Flonase instead

## 2017-09-23 NOTE — Progress Notes (Signed)
Please inform patient that Nasonex is no longer covered by insurance. I have instead sent Rx for flonase nasal spray.

## 2017-09-23 NOTE — Telephone Encounter (Signed)
Nasonex requiring PA, preferred drug list and prior auth form placed in PCP box.

## 2017-09-24 NOTE — Telephone Encounter (Signed)
Pt returned call. I informed her of the information below. Ottis Stain, CMA

## 2017-09-25 ENCOUNTER — Other Ambulatory Visit: Payer: Self-pay | Admitting: Internal Medicine

## 2017-09-25 DIAGNOSIS — Z1231 Encounter for screening mammogram for malignant neoplasm of breast: Secondary | ICD-10-CM

## 2017-10-03 ENCOUNTER — Encounter: Payer: Self-pay | Admitting: Radiation Oncology

## 2017-10-03 ENCOUNTER — Other Ambulatory Visit: Payer: Self-pay

## 2017-10-03 ENCOUNTER — Ambulatory Visit
Admission: RE | Admit: 2017-10-03 | Discharge: 2017-10-03 | Disposition: A | Discharge status: Home / Self Care | Payer: Medicaid Other | Source: Ambulatory Visit | Attending: Radiation Oncology | Admitting: Radiation Oncology

## 2017-10-03 ENCOUNTER — Other Ambulatory Visit (HOSPITAL_COMMUNITY)
Admission: RE | Admit: 2017-10-03 | Discharge: 2017-10-03 | Disposition: A | Discharge status: Home / Self Care | Payer: Medicaid Other | Source: Ambulatory Visit | Attending: Radiation Oncology | Admitting: Radiation Oncology

## 2017-10-03 DIAGNOSIS — Z79899 Other long term (current) drug therapy: Secondary | ICD-10-CM | POA: Diagnosis not present

## 2017-10-03 DIAGNOSIS — Z8542 Personal history of malignant neoplasm of other parts of uterus: Secondary | ICD-10-CM | POA: Diagnosis not present

## 2017-10-03 DIAGNOSIS — C541 Malignant neoplasm of endometrium: Secondary | ICD-10-CM

## 2017-10-03 DIAGNOSIS — Z6841 Body Mass Index (BMI) 40.0 and over, adult: Secondary | ICD-10-CM | POA: Diagnosis not present

## 2017-10-03 DIAGNOSIS — Z7982 Long term (current) use of aspirin: Secondary | ICD-10-CM | POA: Diagnosis not present

## 2017-10-03 DIAGNOSIS — Z08 Encounter for follow-up examination after completed treatment for malignant neoplasm: Secondary | ICD-10-CM | POA: Diagnosis present

## 2017-10-03 MED ORDER — DIAZEPAM 5 MG PO TABS: 5.0000 mg | ORAL_TABLET | Freq: Three times a day (TID) | ORAL | 3 refills | Status: DC | PRN

## 2017-10-03 NOTE — Progress Notes (Signed)
Radiation Oncology         [phone] ________________________________gg  Name: Deborah [name] MRN: [id number]  Date: 10/03/2017  DOB: 08-Feb-1966  Follow-Up Visit Note  CC: Smiley Houseman, MD  Gordy Levan, MD    ICD-10-CM   1. History of endometrial cancer Z85.42 Cytology - PAP  2. Morbid obesity with BMI of 50.0-59.9, adult (HCC)Chronic E66.01    Z68.43     Diagnosis: Stage IIIC grade 2 endometrial adenocarcinoma       Interval Since Last Radiation:  4 years and 5 months   Narrative:  The patient returns today for routine follow-up.  Patient denies any pain. Patient denies any nausea or vomiting. Patient denies any vaginal or rectal bleeding. Patient denies any vaginal discharge. Patient denies any fatigue.  She has stopped using her vaginal dilator which seems reasonable in light of her interval since treatment. The patient continues to work on her weight issues but has remained relatively stable over the past several months                            ALLERGIES:  is allergic to lactose intolerance (gi).  Meds: Current Outpatient Medications  Medication Sig Dispense Refill  . acetaminophen (TYLENOL) 500 MG tablet Take 1,000 mg by mouth every 6 (six) hours as needed for moderate pain (pain).    [name] aspirin 81 MG tablet Take 81 mg by mouth every morning.     . bifidobacterium infantis (ALIGN) capsule Take 1 capsule by mouth daily as needed (for regularity). Reported on 02/23/2016    . carvedilol (COREG) 3.125 MG tablet Take one (1) tablet (3.125 mg) by mouth each morning. Take two (2) tablets (6.25 mg) by mouth each evening. 270 tablet 2  . diazepam (VALIUM) 5 MG tablet Take 1 tablet (5 mg total) by mouth every 8 (eight) hours as needed for anxiety. 30 tablet 3  . fluticasone (FLONASE) 50 MCG/ACT nasal spray Place 2 sprays into both nostrils daily. 16 g 0  . furosemide (LASIX) 20 MG tablet Take 1 tablet (20 mg total) by mouth daily as needed for fluid. 30 tablet 0    . ibuprofen (ADVIL,MOTRIN) 800 MG tablet Take 1 tablet (800 mg total) by mouth every 8 (eight) hours as needed. 60 tablet 0  . lactase (LACTAID) 3000 UNITS tablet Take 1 tablet by mouth 3 (three) times daily as needed (for dairy intake). Reported on 02/23/2016    . loperamide (IMODIUM) 2 MG capsule Take 2 mg by mouth 4 (four) times daily as needed for diarrhea or loose stools (diarrhea). Reported on 02/23/2016    . magnesium chloride (SLOW-MAG) 64 MG TBEC SR tablet Take 1-2 tablets by mouth 2 (two) times daily. Takes two in the morning and one in the evening    . omeprazole (PRILOSEC) 20 MG capsule Take 1 capsule (20 mg total) by mouth daily. 30 capsule 0  . polyethylene glycol powder (GLYCOLAX/MIRALAX) powder Take 17 g by mouth daily as needed. 3350 g 1  . potassium chloride (K-DUR,KLOR-CON) 10 MEQ tablet Take 2 tablets (20 mEq total) by mouth 2 (two) times daily. 120 tablet 0  . sertraline (ZOLOFT) 100 MG tablet Take 1 tablet (100 mg total) by mouth every evening. 30 tablet 1   No current facility-administered medications for this encounter.     Physical Findings: The patient is in no acute distress. Patient is alert and oriented.  weight is 321 lb  6 oz (145.8 kg) (abnormal). Her oral temperature is 98.3 F (36.8 C). Her blood pressure is 129/96 (abnormal) and her pulse is 106 (abnormal). Her respiration is 20 and oxygen saturation is 99%. .  Lungs are clear to auscultation bilaterally. Heart has regular rate and rhythm. No palpable cervical, supraclavicular, or axillary adenopathy. Abdomen soft, non-tender, normal bowel sounds. On pelvic examination the external genitalia were unremarkable. A speculum exam was performed. There are no mucosal lesions noted in the vaginal vault. A Pap smear was obtained of the proximal vagina. On bimanual  examination there were no pelvic masses appreciated. The patient would not permit a rectal exam as part of her evaluation. Exam somewhat compromised in light of  her body habitus.    Lab Findings: Lab Results  Component Value Date   WBC 5.4 01/05/2016   HGB 14.1 01/05/2016   HCT 42.8 01/05/2016   MCV 87.3 01/05/2016   PLT 267 01/05/2016    Radiographic Findings: No results found.  Impression:  No evidence of recurrence on clinical exam today, Pap smear pending  Plan:  Routine follow-up in 1 year. The patient will follow-up with Dr. Skeet Latch in April 2019.  ____________________________________ Gery Pray, MD

## 2017-10-03 NOTE — Progress Notes (Signed)
Patient is here today for her follow-up appointment . Patient denies any pain. Patient denies any nausea or vomiting. Patient denies any vaginal or rectal bleeding. Patient denies any vaginal discharge. Patient denies any fatigue. Vitals:   10/03/17 1644  BP: (!) 129/96  Pulse: (!) 106  Resp: 20  Temp: 98.3 F (36.8 C)  TempSrc: Oral  SpO2: 99%  Weight: (!) 321 lb 6 oz (145.8 kg)   Wt Readings from Last 3 Encounters:  10/03/17 (!) 321 lb 6 oz (145.8 kg)  09/18/17 (!) 322 lb (146.1 kg)  03/05/17 (!) 318 lb (144.2 kg)

## 2017-10-04 NOTE — Addendum Note (Signed)
Encounter addended by: Jacqulyn Liner, RN on: 10/04/2017 10:39 AM  Actions taken: Charge Capture section accepted

## 2017-10-08 LAB — CYTOLOGY - PAP: DIAGNOSIS: NEGATIVE

## 2017-10-10 ENCOUNTER — Telehealth: Payer: Self-pay | Admitting: Oncology

## 2017-10-10 NOTE — Telephone Encounter (Signed)
Left a message for patient regarding good pap smear results.  Requested a return call.

## 2017-10-11 NOTE — Telephone Encounter (Signed)
Patient called back and was advised of her good pap smear results per Dr. Sondra Come.  She verbalized agreement and said she would like to stop by and get a copy of the results.

## 2017-10-24 ENCOUNTER — Other Ambulatory Visit: Payer: Self-pay | Admitting: Internal Medicine

## 2017-10-24 ENCOUNTER — Ambulatory Visit
Admission: RE | Admit: 2017-10-24 | Discharge: 2017-10-24 | Disposition: A | Discharge status: Home / Self Care | Payer: Medicaid Other | Source: Ambulatory Visit | Attending: Family Medicine | Admitting: Family Medicine

## 2017-10-24 DIAGNOSIS — Z1231 Encounter for screening mammogram for malignant neoplasm of breast: Secondary | ICD-10-CM

## 2017-10-30 ENCOUNTER — Telehealth: Payer: Self-pay | Admitting: Internal Medicine

## 2017-10-30 NOTE — Telephone Encounter (Signed)
Attempted to call patient, went to voicemail and left message to call back.

## 2017-10-30 NOTE — Telephone Encounter (Signed)
Pt would like her lab results from Oct 25. She hasnt received a letter or a call from the dr

## 2017-10-30 NOTE — Telephone Encounter (Signed)
Will forward to MD to advise.  Patient didn't get the labs on 09/19/17. Jazmin Hartsell,CMA

## 2017-11-01 ENCOUNTER — Other Ambulatory Visit: Payer: Self-pay | Admitting: Internal Medicine

## 2017-11-01 DIAGNOSIS — Z8542 Personal history of malignant neoplasm of other parts of uterus: Secondary | ICD-10-CM

## 2017-11-05 ENCOUNTER — Telehealth: Payer: Self-pay | Admitting: Internal Medicine

## 2017-11-05 NOTE — Telephone Encounter (Signed)
Attempted to call patient to return her call and also to discuss her potassium refill. Left message to call back and provide a time that best fits with her schedule for a return call.

## 2017-11-06 ENCOUNTER — Telehealth: Payer: Self-pay | Admitting: Internal Medicine

## 2017-11-06 NOTE — Telephone Encounter (Signed)
Pt is returning call to [facility] and the pt is sick right now and asked that Gunadasa call her back 12/18 at 12:00.. Please advise

## 2017-11-06 NOTE — Telephone Encounter (Signed)
Will forward to MD. Deborah [name],CMA  

## 2017-11-07 NOTE — Telephone Encounter (Signed)
Attempted to call patient, went to voicemail. Left message to verify that she wanted me to call on the 18th. Asked her to call back at her convenience.

## 2017-11-12 ENCOUNTER — Telehealth: Payer: Self-pay | Admitting: Internal Medicine

## 2017-11-12 NOTE — Telephone Encounter (Signed)
Attempted to call patient but went to voicemail. Left message asking her to let us know a good time to call her so we do not keep missing each other.

## 2017-11-12 NOTE — Telephone Encounter (Signed)
Would like to talk to dr Dallas Schimke about her results

## 2017-11-12 NOTE — Telephone Encounter (Signed)
Will forward to MD. Jazmin Hartsell,CMA  

## 2017-11-20 ENCOUNTER — Telehealth: Payer: Self-pay | Admitting: Internal Medicine

## 2017-11-20 ENCOUNTER — Other Ambulatory Visit: Payer: Self-pay | Admitting: Internal Medicine

## 2017-11-20 DIAGNOSIS — Z8542 Personal history of malignant neoplasm of other parts of uterus: Secondary | ICD-10-CM

## 2017-11-20 NOTE — Telephone Encounter (Signed)
Pt needs a refill on her potasium . jw'

## 2017-11-21 NOTE — Telephone Encounter (Signed)
Pt called and I read off the phone note from dr and the pt said she never received a call and that she told someone of certain times she is supposed to be called. I verified the number in her chart is the right number and the pt confirmed that it is

## 2017-11-21 NOTE — Telephone Encounter (Signed)
Pt calling again needing a refill on her potassium. She still hasn't heard back from anyone. Please advise

## 2017-11-21 NOTE — Telephone Encounter (Signed)
Spoke with patient regarding her potassium and she voiced understanding on needing labs before a refill.  She made an appointment to discuss this lab and her labs from October on 12/04/17.  Patient also states that she never heard regarding these labs and wanted to make sure they were fine.  Explained labs to patient and let her know that her creatinine was a little elevated but we would recheck this the next time she comes in.  Advised her to drink plenty of fluids and fast for these labs.  Patient voiced understanding. Jazmin Hartsell,CMA

## 2017-11-21 NOTE — Telephone Encounter (Signed)
We have tried to get in touch with patient multiple times. When I call the phone number listed, it goes straight to voicemail. I have left messages to call the clinic back. I also tried to call the emergency contact number but it does not seem to be a valid number. Please see if you are able to call patient and if patient calls back, please ensure the number we have for her is the best contact number.   Regarding her potassium, I do not think she needs to be on this medication currently.i would to recheck her potassium levels after she has been off of the potassium supplements for about 1 week. I would like to discuss this with her over the phone but I am unable to get in touch.

## 2017-12-04 ENCOUNTER — Ambulatory Visit: Payer: Medicaid Other | Admitting: Internal Medicine

## 2017-12-12 ENCOUNTER — Ambulatory Visit (HOSPITAL_BASED_OUTPATIENT_CLINIC_OR_DEPARTMENT_OTHER): Payer: Medicaid Other

## 2017-12-27 ENCOUNTER — Ambulatory Visit (HOSPITAL_BASED_OUTPATIENT_CLINIC_OR_DEPARTMENT_OTHER): Payer: Medicaid Other | Attending: Family Medicine | Admitting: Internal Medicine

## 2017-12-27 DIAGNOSIS — R51 Headache: Secondary | ICD-10-CM | POA: Insufficient documentation

## 2017-12-27 DIAGNOSIS — G4736 Sleep related hypoventilation in conditions classified elsewhere: Secondary | ICD-10-CM | POA: Insufficient documentation

## 2017-12-27 DIAGNOSIS — Z6841 Body Mass Index (BMI) 40.0 and over, adult: Secondary | ICD-10-CM | POA: Insufficient documentation

## 2017-12-27 DIAGNOSIS — R0683 Snoring: Secondary | ICD-10-CM | POA: Insufficient documentation

## 2017-12-27 DIAGNOSIS — Z79899 Other long term (current) drug therapy: Secondary | ICD-10-CM | POA: Diagnosis not present

## 2017-12-27 DIAGNOSIS — G4733 Obstructive sleep apnea (adult) (pediatric): Secondary | ICD-10-CM | POA: Diagnosis not present

## 2017-12-27 DIAGNOSIS — E669 Obesity, unspecified: Secondary | ICD-10-CM | POA: Insufficient documentation

## 2018-01-04 DIAGNOSIS — R0683 Snoring: Secondary | ICD-10-CM

## 2018-01-04 NOTE — Procedures (Signed)
   Patient Name: Deborah [name], Deborah [name] Date: 12/27/2017 Gender: Female D.O.B: 1966/06/14 Age (years): 51 Referring Provider: Esmond Camper Height (inches): 48 Interpreting Physician: Baird Lyons MD, ABSM Weight (lbs): 321 RPSGT: Jorge Ny BMI: 52 MRN: [id number] Neck Size: 14.00 <br> <br> CLINICAL INFORMATION Sleep Study Type: NPSG Indication for sleep study: Morning Headaches, Obesity, Snoring Epworth Sleepiness Score: 2  SLEEP STUDY TECHNIQUE As per the AASM Manual for the Scoring of Sleep and Associated Events v2.3 (April 2016) with a hypopnea requiring 4% desaturations.  The channels recorded and monitored were frontal, central and occipital EEG, electrooculogram (EOG), submentalis EMG (chin), nasal and oral airflow, thoracic and abdominal wall motion, anterior tibialis EMG, snore microphone, electrocardiogram, and pulse oximetry.  MEDICATIONS Medications self-administered by patient taken the night of the study : CARVEDILOL  SLEEP ARCHITECTURE The study was initiated at 10:38:07 PM and ended at 4:57:34 AM.  Sleep onset time was 106.2 minutes and the sleep efficiency was 63.1%. The total sleep time was 239.5 minutes.  Stage REM latency was N/A minutes.  The patient spent 13.57% of the night in stage N1 sleep, 86.43% in stage N2 sleep, 0.00% in stage N3 and 0.00% in REM.  Alpha intrusion was absent.  Supine sleep was 0.00%.  RESPIRATORY PARAMETERS The overall apnea/hypopnea index (AHI) was 116.7 per hour. There were 98 total apneas, including 98 obstructive, 0 central and 0 mixed apneas. There were 368 hypopneas and 37 RERAs.  The AHI during Stage REM sleep was N/A per hour.  AHI while supine was N/A per hour.  The mean oxygen saturation was 91.58%. The minimum SpO2 during sleep was 80.00%.  moderate snoring was noted during this study.  CARDIAC DATA The 2 lead EKG demonstrated sinus rhythm. The mean heart rate was 93.74 beats per minute. Other EKG  findings include: None.  LEG MOVEMENT DATA The total PLMS were 0 with a resulting PLMS index of 0.00. Associated arousal with leg movement index was 0.0 .  IMPRESSIONS - Severe obstructive sleep apnea occurred during this study (AHI = 116.7/h). - Significant delay in sleep onset - unable to meet protocol requirements for split CPAP protocol on this study nigtht. - No significant central sleep apnea occurred during this study (CAI = 0.0/h). - Moderate oxygen desaturation was noted during this study (Min O2 = 80.00%). - The patient snored with moderate snoring volume. - No cardiac abnormalities were noted during this study. - Clinically significant periodic limb movements did not occur during sleep. No significant associated arousals.  DIAGNOSIS - Obstructive Sleep Apnea (327.23 [G47.33 ICD-10]) - Nocturnal Hypoxemia (327.26 [G47.36 ICD-10])  RECOMMENDATIONS - CPAP titration study or AutoPAP is recommended  - Avoid alcohol, sedatives and other CNS depressants that may worsen sleep apnea and disrupt normal sleep architecture. - Sleep hygiene should be reviewed to assess factors that may improve sleep quality. - Weight management and regular exercise should be initiated or continued if appropriate.  [Electronically signed] 01/04/2018 10:35 AM  Baird Lyons MD, ABSM Diplomate, American Board of Sleep Medicine   NPI: [id number]                          [place] [place] [facility]  ELECTRONICALLY SIGNED ON:  01/04/2018, 10:30 AM [place] PH: [phone]   FX: [phone] [place]

## 2018-01-06 ENCOUNTER — Telehealth: Payer: Self-pay | Admitting: Internal Medicine

## 2018-01-06 DIAGNOSIS — G4733 Obstructive sleep apnea (adult) (pediatric): Secondary | ICD-10-CM

## 2018-01-06 NOTE — Telephone Encounter (Signed)
Attempted to call patient about sleep study results of OSA. But went to voicemail. Left message to call back. Will go ahead and order DME.

## 2018-01-07 ENCOUNTER — Encounter: Payer: Self-pay | Admitting: Internal Medicine

## 2018-01-07 NOTE — Progress Notes (Signed)
Continues to go to voicemail. I will send a letter to inform of OSA. CPAP machine already ordered.

## 2018-01-10 ENCOUNTER — Other Ambulatory Visit: Payer: Self-pay | Admitting: Internal Medicine

## 2018-01-13 ENCOUNTER — Other Ambulatory Visit: Payer: Self-pay | Admitting: Internal Medicine

## 2018-01-13 DIAGNOSIS — G4733 Obstructive sleep apnea (adult) (pediatric): Secondary | ICD-10-CM

## 2018-01-20 ENCOUNTER — Telehealth: Payer: Self-pay

## 2018-01-20 NOTE — Telephone Encounter (Signed)
Attempted to call patient but went to voicemail. Left message to call back.   I wanted to report that her sleep study showed sleep apnea, and that I ordered a CPAP machine for the patient.

## 2018-01-20 NOTE — Telephone Encounter (Signed)
Patient left message on nurse line that she is returning a call to PCP. No recent note seen. Danley Danker, RN [facility] RN)

## 2018-02-28 ENCOUNTER — Other Ambulatory Visit: Payer: Self-pay | Admitting: Cardiology

## 2018-02-28 MED ORDER — CARVEDILOL 3.125 MG PO TABS: ORAL_TABLET | ORAL | 0 refills | Status: DC

## 2018-02-28 NOTE — Telephone Encounter (Signed)
New message    *STAT* If patient is at the pharmacy, call can be transferred to refill team.   1. Which medications need to be refilled? (please list name of each medication and dose if known) carvedilol (COREG) 3.125 MG tablet  2. Which pharmacy/location (including street and city if local pharmacy) is medication to be sent to?[facility] [phone] - [place], [place]  3. Do they need a 30 day or 90 day supply? [place]

## 2018-02-28 NOTE — Telephone Encounter (Signed)
Rx(s) sent to pharmacy electronically.  

## 2018-03-03 ENCOUNTER — Telehealth: Payer: Self-pay | Admitting: Oncology

## 2018-03-03 DIAGNOSIS — C541 Malignant neoplasm of endometrium: Secondary | ICD-10-CM

## 2018-03-03 MED ORDER — DIAZEPAM 5 MG PO TABS: 5.0000 mg | ORAL_TABLET | Freq: Three times a day (TID) | ORAL | 3 refills | Status: DC | PRN

## 2018-03-03 NOTE — Telephone Encounter (Signed)
Called in refill for diazepam per Dr. Sondra Come to [facility].

## 2018-03-03 NOTE — Telephone Encounter (Signed)
Patient called and requested a refill on diazepam.  She last had it filled on 10/03/17 with 3 refills.  She does not have any follow up appointments scheduled with Dr. Sondra Come.

## 2018-03-03 NOTE — Addendum Note (Signed)
Addended by: Jacqulyn Liner on: 03/03/2018 04:37 PM   Modules accepted: Orders

## 2018-03-05 ENCOUNTER — Telehealth: Payer: Self-pay | Admitting: *Deleted

## 2018-03-05 NOTE — Telephone Encounter (Signed)
Shirley from radiation called and scheduled the patient to see Dr. Skeet Latch tomorrow. Enid Derry will call the patient

## 2018-03-05 NOTE — Telephone Encounter (Signed)
Called patient to inform of appt. with Dr. Skeet Latch on 03-06-18 - arrival time- 11:45 am, lvm for a return call

## 2018-03-06 ENCOUNTER — Ambulatory Visit: Payer: Medicaid Other | Admitting: Gynecologic Oncology

## 2018-03-07 ENCOUNTER — Telehealth: Payer: Self-pay | Admitting: *Deleted

## 2018-03-07 NOTE — Telephone Encounter (Signed)
CALLED PATIENT TO INFORM OF FU DR. Skeet Latch ON 04-10-18 @ 11:15 AM, LVM FOR A RETURN CALL

## 2018-03-07 NOTE — Telephone Encounter (Signed)
Returned Deborah [name] call from radiation and rescheduled the appt from yesterday to May 16th

## 2018-03-26 ENCOUNTER — Other Ambulatory Visit: Payer: Self-pay

## 2018-03-26 MED ORDER — OMEPRAZOLE 20 MG PO CPDR: 20.0000 mg | DELAYED_RELEASE_CAPSULE | Freq: Every day | ORAL | 0 refills | Status: DC

## 2018-04-10 ENCOUNTER — Ambulatory Visit: Payer: Medicaid Other | Admitting: Gynecologic Oncology

## 2018-04-10 ENCOUNTER — Telehealth: Payer: Self-pay | Admitting: *Deleted

## 2018-04-10 NOTE — Telephone Encounter (Signed)
Patient called and rescheduled her appt form today to June 20th. Patient is sick

## 2018-05-15 ENCOUNTER — Encounter: Payer: Self-pay | Admitting: Gynecologic Oncology

## 2018-05-15 ENCOUNTER — Inpatient Hospital Stay: Payer: Medicaid Other | Attending: Gynecologic Oncology | Admitting: Gynecologic Oncology

## 2018-05-15 VITALS — BP 118/84 | HR 101 | Temp 98.1°F | Resp 18 | Ht 66.0 in | Wt 309.0 lb

## 2018-05-15 DIAGNOSIS — C541 Malignant neoplasm of endometrium: Secondary | ICD-10-CM

## 2018-05-15 DIAGNOSIS — Z90710 Acquired absence of both cervix and uterus: Secondary | ICD-10-CM | POA: Diagnosis not present

## 2018-05-15 DIAGNOSIS — Z923 Personal history of irradiation: Secondary | ICD-10-CM

## 2018-05-15 DIAGNOSIS — Z90722 Acquired absence of ovaries, bilateral: Secondary | ICD-10-CM

## 2018-05-15 DIAGNOSIS — Z9221 Personal history of antineoplastic chemotherapy: Secondary | ICD-10-CM | POA: Diagnosis not present

## 2018-05-15 NOTE — Patient Instructions (Signed)
Dr Skeet Latch would like you to have a office visit with her in December 2020.   Call our office if any questions or concerns prior to your return visit.

## 2018-05-15 NOTE — Progress Notes (Signed)
Office Visit:  GYN ONCOLOGY   CC: Endometrial cancer surveillance,  HPI: 52 y.o.  G2 P1 LNMP  06/2012.  Deborah [name] was referred to Dr. Hulan Fray who collected an endometrial biopsy and a endocervical biopsy. Both were positive for endometrial adenocarcinoma.  On 10/07/2012 she underwent robotic hysterectomy bilateral salpingo-oophorectomy. At the time of surgery multiple cystic lesions were noted throughout the entire pelvis that was suspicious for metastatic disease, as such lymph node dissection was not collected but these lesions were biopsied. Final pathology was notable for  1. Soft tissue, biopsy, right para-colic gutter - BENIGN PARA-COLIC GUTTER SOFT TISSUE WITH BENIGN CYST (4.0 CM). SEE COMMENT. - NEGATIVE FOR MALIGNANCY. 2. Uterus +/- tubes/ovaries, neoplastic - ENDOMETRIAL ADENOCARCINOMA, ENDOMETRIOID-TYPE, SEE COMMENT. - TUMOR INVADES LESS THAN 1/2 OF MYOMETRIAL THICKNESS.- NO LYMPHOVASCULAR INVASION IDENTIFIED. - TUMOR INVADES INTO LOWER UTERINE SEGMENT. - UTERINE ADENOMYOSIS. - BENIGN RIGHT AND LEFT OVARIES; NO ATYPIA OR MALIGNANCY PRESENT. - BENIGN RIGHT AND LEFT FALLOPIAN TUBES WITH BENIGN SEROUS-TYPE PARATUBAL CYSTS. - BENIGN CERVIX; NEGATIVE FOR INTRAEPITHELIAL LESION OR MALIGNANCY. - BENIGN MULTILOCULAR UTERINE SEROSAL CYST.  CT abdomen and pelvis 11/25/2012  IMPRESSION:  1. Shotty bilateral external iliac lymphadenopathy measuring up to 1.3 cm, suspicious for metastatic disease. Nonspecific less than 5 mm retroperitoneal lymph nodes also noted in the left para-aortic region. 2. 2.7 cm postop lymphocele versus low attenuation lymphadenopathy in the proximal left external iliac chain. 3. Cholelithiasis and right nephrolithiasis incidentally noted.   End of treatment CT 08/25/2013 notable for no evidence of abdominal lymphadenopathy. There Is no evidence of inflammatory process abscess or ascites the adnexal regions are unremarkable in appearance there is no new or progressive  disease within the abdomen or pelvis   No emesis, intermittent diarrhea since cholecystectomy no vaginal bleeding, no cough.  Excellent appetite reports vaginal dryness   Past Surgical Hx:  Past Surgical History:  Procedure Laterality Date  . ABDOMINAL HYSTERECTOMY  10/2011   complete  . CHOLECYSTECTOMY N/A 07/08/2015   Procedure: LAPAROSCOPIC CHOLECYSTECTOMY WITH INTRAOPERATIVE CHOLANGIOGRAM;  Surgeon: Judeth Horn, MD;  Location: [place];  Service: General;  Laterality: N/A;  . HIATAL HERNIA REPAIR    . REPAIR VAGINAL CUFF  10/07/2012   Procedure: REPAIR VAGINAL CUFF;  Surgeon: [name], MD PHD;  [facility];  Service: Gynecology;;  . ROBOTIC ASSISTED TOTAL HYSTERECTOMY WITH BILATERAL SALPINGO OOPHERECTOMY  10/07/2012   Procedure: ROBOTIC ASSISTED TOTAL HYSTERECTOMY WITH BILATERAL SALPINGO OOPHORECTOMY;  Surgeon: [name], MD PHD;  [facility];  Service: Gynecology;  Laterality: Bilateral;    Past Medical Hx:  Past Medical History:  Diagnosis Date  . Anemia   . Anxiety   . Arthritis    ,Knees, back  . Congenital birth defect    Right hand  . Depression   . Endometrial cancer ([place])   . HLD (hyperlipidemia)    borderline  . Hx of echocardiogram    Echo (9/15): EF 50-55%, normal wall motion, grade 2 diastolic dysfunction, mild LAE  . Hx of radiation therapy 04/07/13- 05/14/13   pelvis 45 gray 25 fx  . Hypertension   . Obesity   . Swelling    ANKLES - TAKES LASIX   Social history: Celebrated her 52th birthday in April. Past Gynecological History:  G2 P1 Menarche 41, Patient's last menstrual period was 09/20/2012. No h/o abn pap test  Family Hx:  Family History  Problem Relation Age of Onset  . Diabetes Mother   . Pancreatic cancer Maternal Grandfather  or liver cancer  . Hypertension Maternal Grandfather   . Cancer Maternal Grandfather        pancreatic  . Hypertension Maternal Grandmother   . Colon polyps Maternal Aunt   . Cancer Maternal  Aunt        lung  . Colon cancer Cousin   . Cancer Cousin        lung  . Lung cancer Maternal Aunt   . Stroke Cousin   . Heart attack Neg Hx    Mat aunt lung cancer Dx 26 (tob use) Mat GF pancreatic cancer Mat cousin lung cancer dx 60's (tob use)  Review of Systems: Constitutional  Feels well, weight gain, no malaise. No fever Cardiovascular  No chest pain, shortness of breath, or edema  Pulmonary  No cough or wheeze.  Gastro Intestinal  Mild nausea, no vomitting,  No rectal bleeding. Genito Urinary  No frequency, urgency, dysuria,no vaginal spotting or discharge. Musculo Skeletal  No myalgia, left knee arthralgia,  joint swelling and pain   Physical Exam: BP 118/84 (BP Location: Left Arm, Patient Position: Sitting)   Pulse (!) 101   Temp 98.1 F (36.7 C) (Oral)   Resp 18   Ht 5\' 6"  (1.676 m)   Wt (!) 309 lb (140.2 kg)   LMP 09/20/2012   SpO2 99%   BMI 49.87 kg/m  Wt Readings from Last 3 Encounters:  05/15/18 (!) 309 lb (140.2 kg)  12/27/17 (!) 321 lb (145.6 kg)  10/03/17 (!) 321 lb 6 oz (145.8 kg)    WD in NAD CHEST:  CTA CARDIAC:  RRR ABDOMEN :  Soft nontender. Trocar sites without evidence of mass or hernia. Periumbilical incision without mass or hernia. Normoactive bowel sounds, abdomen soft, non-tender and morbidly obese.  BACK:  No CVAT LN:  No cervical supra clavicular or inguinal adenopathy PELVIC:  Nl EGBUS, Vaginal cuff intact.  No discharge, bleeding or tenderness or cul de sac nodularity. EXT:  Missing digits on the left hand.  No lower extremity edema or cyanosis. Rectal: Declined  Assessment/Plan:  Deborah [name]  is a 52 y.o.  year old with grade 2 endometrial adenocarcinoma noted on  the endometrial biopsy and endocervical curettage specimens. She underwent robotic total laparoscopic hysterectomy bilateral salpingo-oophorectomy. At the time of surgery diffuse miliary cystic-like lesions were noted in the cul-de-sac on on the rectosigmoid  colon highly suspicious for metastatic disease. Because of these findings lymph node dissection was not performed. Final pathology was notable for grade 2 endometrioid endometrial adenocarcinoma with 12.5% myometrial invasion no lymphovascular space invasion no cervical involvement. NED  Received 6 cycles of Taxol/Carboplatin and pelvic XRT sandwich fashion.  Completed 07/24/2013. F/u with Gyn December 2020 F/U with Dr. Sondra Come [phone] Annual Pap test not indicated per [facility] surveillance guidelines

## 2018-05-22 ENCOUNTER — Other Ambulatory Visit: Payer: Self-pay | Admitting: Internal Medicine

## 2018-06-24 ENCOUNTER — Other Ambulatory Visit: Payer: Self-pay | Admitting: Cardiology

## 2018-06-24 MED ORDER — CARVEDILOL 3.125 MG PO TABS: ORAL_TABLET | ORAL | 0 refills | Status: DC

## 2018-06-24 NOTE — Telephone Encounter (Signed)
New Message    *STAT* If patient is at the pharmacy, call can be transferred to refill team.   1. Which medications need to be refilled? (please list name of each medication and dose if known) carvedilol (COREG) 3.125 MG tablet  2. Which pharmacy/location (including street and city if local pharmacy) is medication to be sent to? [facility] [phone] - [place], [place]  3. Do they need a 30 day or 90 day supply? [place]

## 2018-06-24 NOTE — Telephone Encounter (Signed)
Prescribing Provider Encounter Provider  Minus Breeding, MD Minus Breeding, MD  Outpatient Medication Detail    Disp Refills Start End   carvedilol (COREG) 3.125 MG tablet 90 tablet 0 06/24/2018    Sig: Take one (1) tablet (3.125 mg) by mouth each morning. Take two (2) tablets (6.25 mg) by mouth each evening.   Sent to pharmacy as: carvedilol (COREG) 3.125 MG tablet   E-Prescribing Status: Receipt confirmed by pharmacy (06/24/2018 11:20 AM EDT)   Pharmacy   [facility] [place], [place]

## 2018-06-24 NOTE — Telephone Encounter (Signed)
30 day refill sent in to requested pharmacy. Pt has appointment to follow-up with Dr. Percival Spanish in August.

## 2018-07-04 ENCOUNTER — Other Ambulatory Visit: Payer: Self-pay | Admitting: Radiation Oncology

## 2018-07-04 ENCOUNTER — Telehealth: Payer: Self-pay

## 2018-07-04 DIAGNOSIS — C541 Malignant neoplasm of endometrium: Secondary | ICD-10-CM

## 2018-07-04 MED ORDER — DIAZEPAM 5 MG PO TABS: 5.0000 mg | ORAL_TABLET | Freq: Three times a day (TID) | ORAL | 0 refills | Status: DC | PRN

## 2018-07-04 NOTE — Telephone Encounter (Signed)
Pt calling for Valium refill. Conveyed that Dr. Sondra Come was on vacation and this RN would request Dr. Lisbeth Renshaw to refill in his absence. Pt requested script sent to [facility] on [address]. Conveyed/requested refill from Dr. Lisbeth Renshaw, who will send script to [facility] on [address]. Returned call to pt to convey script would be sent today and that pt's number had been updated in EMR. Pt verbalized understanding and appreciation. Loma Sousa, RN BSN

## 2018-07-07 ENCOUNTER — Other Ambulatory Visit: Payer: Self-pay | Admitting: Radiation Oncology

## 2018-07-07 DIAGNOSIS — C541 Malignant neoplasm of endometrium: Secondary | ICD-10-CM

## 2018-07-07 MED ORDER — DIAZEPAM 5 MG PO TABS: 5.0000 mg | ORAL_TABLET | Freq: Three times a day (TID) | ORAL | 0 refills | Status: DC | PRN

## 2018-07-08 NOTE — Progress Notes (Deleted)
HPI The patient presents for evaluation of a reduced EF.  Deborah KitchenShe was seen here in 2000 and and did have a stress perfusion study. This demonstrated possibly a reduced ejection fraction of 47%. There was artifact. There was perhaps some hypoperfusion of the anterior septum. Cardiac catheterization was planned but this did not happen. I saw her in 2016 and I ordered dobutamine echocardiogram. This confirmed the ejection fraction was somewhat low at 35%. However, there was no evidence of ischemia or infarct. She was seen by Richardson Dopp PAc and we did try to add lisinopril to her regimen. However, she did not tolerate this. I increased her Coreg. She saw Nicki Reaper again in Sept 2016.  An echocardiogram at that visit demonstrated the EF was now low normal.   I saw her last in Feb of last year. ***   She reports that she still does get some discomfort. This happens sporadically. She feels it might like occasionally her heart is racing. She'll otherwise has some chest tightness. It's difficult for her to quantify or qualify this. It is sporadic. He is limited her activities because of back and joint pain and morbid obesity.  He is she does not describe associated symptoms such as nausea vomiting or diaphoresis. She's not had any presyncope or syncope.    Allergies  Allergen Reactions  . Lactose Intolerance (Gi) Diarrhea    Current Outpatient Medications  Medication Sig Dispense Refill  . acetaminophen (TYLENOL) 500 MG tablet Take 1,000 mg by mouth every 6 (six) hours as needed for moderate pain (pain).    Deborah [name] aspirin 81 MG tablet Take 81 mg by mouth every morning.     . bifidobacterium infantis (ALIGN) capsule Take 1 capsule by mouth daily as needed (for regularity). Reported on 02/23/2016    . carvedilol (COREG) 3.125 MG tablet Take one (1) tablet (3.125 mg) by mouth each morning. Take two (2) tablets (6.25 mg) by mouth each evening. 90 tablet 0  . diazepam (VALIUM) 5 MG tablet Take 1 tablet (5 mg total) by  mouth every 8 (eight) hours as needed for anxiety. 30 tablet 0  . fluticasone (FLONASE) 50 MCG/ACT nasal spray Place 2 sprays into both nostrils daily. 16 g 0  . furosemide (LASIX) 20 MG tablet Take 1 tablet (20 mg total) by mouth daily as needed for fluid. 30 tablet 0  . ibuprofen (ADVIL,MOTRIN) 800 MG tablet Take 1 tablet (800 mg total) by mouth every 8 (eight) hours as needed. 60 tablet 0  . lactase (LACTAID) 3000 UNITS tablet Take 1 tablet by mouth 3 (three) times daily as needed (for dairy intake). Reported on 02/23/2016    . loperamide (IMODIUM) 2 MG capsule Take 2 mg by mouth 4 (four) times daily as needed for diarrhea or loose stools (diarrhea). Reported on 02/23/2016    . magnesium chloride (SLOW-MAG) 64 MG TBEC SR tablet Take 1-2 tablets by mouth 2 (two) times daily. Takes two in the morning and one in the evening    . omeprazole (PRILOSEC) 20 MG capsule TAKE 1 CAPSULE(20 MG) BY MOUTH DAILY 30 capsule 3  . polyethylene glycol powder (GLYCOLAX/MIRALAX) powder Take 17 g by mouth daily as needed. 3350 g 1  . potassium chloride (K-DUR,KLOR-CON) 10 MEQ tablet Take 2 tablets (20 mEq total) by mouth 2 (two) times daily. 120 tablet 0  . sertraline (ZOLOFT) 100 MG tablet Take 1 tablet (100 mg total) by mouth every evening. 30 tablet 1   No current facility-administered medications for  this visit.     Past Medical History:  Diagnosis Date  . Anemia   . Anxiety   . Arthritis    ,Knees, back  . Congenital birth defect    Right hand  . Depression   . Endometrial cancer ([place])   . HLD (hyperlipidemia)    borderline  . Hx of echocardiogram    Echo (9/15): EF 50-55%, normal wall motion, grade 2 diastolic dysfunction, mild LAE  . Hx of radiation therapy 04/07/13- 05/14/13   pelvis 45 gray 25 fx  . Hypertension   . Obesity   . Swelling    ANKLES - TAKES LASIX    Past Surgical History:  Procedure Laterality Date  . ABDOMINAL HYSTERECTOMY  10/2011   complete  . CHOLECYSTECTOMY N/A 07/08/2015    Procedure: LAPAROSCOPIC CHOLECYSTECTOMY WITH INTRAOPERATIVE CHOLANGIOGRAM;  Surgeon: Judeth Horn, MD;  Location: [place];  Service: General;  Laterality: N/A;  . HIATAL HERNIA REPAIR    . REPAIR VAGINAL CUFF  10/07/2012   Procedure: REPAIR VAGINAL CUFF;  Surgeon: [name], MD PHD;  [facility];  Service: Gynecology;;  . ROBOTIC ASSISTED TOTAL HYSTERECTOMY WITH BILATERAL SALPINGO OOPHERECTOMY  10/07/2012   Procedure: ROBOTIC ASSISTED TOTAL HYSTERECTOMY WITH BILATERAL SALPINGO OOPHORECTOMY;  Surgeon: [name], MD PHD;  [facility];  Service: Gynecology;  Laterality: Bilateral;    ROS:  ***  PHYSICAL EXAM LMP 09/20/2012  GENERAL:  Well appearing NECK:  No jugular venous distention, waveform within normal limits, carotid upstroke brisk and symmetric, no bruits, no thyromegaly LUNGS:  Clear to auscultation bilaterally CHEST:  Unremarkable HEART:  PMI not displaced or sustained,S1 and S2 within normal limits, no S3, no S4, no clicks, no rubs, *** murmurs ABD:  Flat, positive bowel sounds normal in frequency in pitch, no bruits, no rebound, no guarding, no midline pulsatile mass, no hepatomegaly, no splenomegaly EXT:  2 plus pulses throughout, no edema, no cyanosis no clubbing   ***GENERAL:  Well appearing NECK:  No jugular venous distention, waveform within normal limits, carotid upstroke brisk and symmetric, no bruits, no thyromegaly LUNGS:  Clear to auscultation bilaterally HEART:  PMI not displaced or sustained,S1 and S2 within normal limits, no S3, no S4, possible click, no rubs, no murmurs ABD:  Flat, positive bowel sounds normal in frequency in pitch, no bruits, no rebound, no guarding, no midline pulsatile mass, no hepatomegaly, no splenomegaly EXT:  2 plus pulses throughout, no edema, no cyanosis no clubbing, right hand congenital abnormality  EKG:  Sinus rhythm, rate ***, axis within normal limits, intervals within normal limits, no acute ST-T  changes.  ASSESSMENT AND PLAN  Chest pain - ***  She has symptoms that are difficult to assess. I will screen her with a DSE.    Cardiomyopathy - ***  Her EF will be assessed as above.

## 2018-07-09 ENCOUNTER — Ambulatory Visit: Payer: Medicaid Other | Admitting: Cardiology

## 2018-07-20 NOTE — Progress Notes (Signed)
HPI The patient presents for evaluation of a reduced EF.  Deborah KitchenShe was seen here in 2000 and and did have a stress perfusion study. This demonstrated possibly a reduced ejection fraction of 47%. There was artifact. There was perhaps some hypoperfusion of the anterior septum. Cardiac catheterization was planned but this did not happen. I saw her in 2016 and I ordered dobutamine echocardiogram. This confirmed the ejection fraction was somewhat low at 35%. However, there was no evidence of ischemia or infarct. She was seen by Richardson Dopp PAc and we did try to add lisinopril to her regimen. However, she did not tolerate this. I increased her Coreg. She saw Nicki Reaper again in Sept 2016.  An echocardiogram at that visit demonstrated the EF was now low normal.   I saw her last in Feb of last year.   Since I last saw her she continues to have symptoms such as shortness of breath.  She will have this if she has to go upstairs which she avoids.  She has a solid sensation of waking up feeling like she is run a race.  She is not really describing tachypalpitations.  She might be short of breath.  It does not happen all the time.  She wonders if she is having bad dreams.  It happens sporadically.  She might have some palpitations but she is not describing sustained tachyarrhythmias, presyncope or syncope.  She is not having any chest pressure, neck or arm discomfort.  She has had an intentional 10 pound weight loss.   Allergies  Allergen Reactions  . Lactose Intolerance (Gi) Diarrhea    Current Outpatient Medications  Medication Sig Dispense Refill  . acetaminophen (TYLENOL) 500 MG tablet Take 1,000 mg by mouth every 6 (six) hours as needed for moderate pain (pain).    Deborah [name] aspirin 81 MG tablet Take 81 mg by mouth every morning.     . diazepam (VALIUM) 5 MG tablet Take 1 tablet (5 mg total) by mouth every 8 (eight) hours as needed for anxiety. 30 tablet 0  . fluticasone (FLONASE) 50 MCG/ACT nasal spray Place 2 sprays  into both nostrils daily. 16 g 0  . furosemide (LASIX) 20 MG tablet Take 1 tablet (20 mg total) by mouth daily as needed for fluid. 30 tablet 0  . ibuprofen (ADVIL,MOTRIN) 800 MG tablet Take 1 tablet (800 mg total) by mouth every 8 (eight) hours as needed. 60 tablet 0  . lactase (LACTAID) 3000 UNITS tablet Take 1 tablet by mouth 3 (three) times daily as needed (for dairy intake). Reported on 02/23/2016    . loperamide (IMODIUM) 2 MG capsule Take 2 mg by mouth 4 (four) times daily as needed for diarrhea or loose stools (diarrhea). Reported on 02/23/2016    . magnesium chloride (SLOW-MAG) 64 MG TBEC SR tablet Take 1-2 tablets by mouth 2 (two) times daily. Takes two in the morning and one in the evening    . omeprazole (PRILOSEC) 20 MG capsule TAKE 1 CAPSULE(20 MG) BY MOUTH DAILY 30 capsule 3  . polyethylene glycol powder (GLYCOLAX/MIRALAX) powder Take 17 g by mouth daily as needed. 3350 g 1  . sertraline (ZOLOFT) 100 MG tablet Take 1 tablet (100 mg total) by mouth every evening. 30 tablet 1  . bifidobacterium infantis (ALIGN) capsule Take 1 capsule by mouth daily as needed (for regularity). Reported on 02/23/2016    . carvedilol (COREG) 3.125 MG tablet Take one (1) tablet (3.125 mg) by mouth each morning. Take two (2)  tablets (6.25 mg) by mouth each evening. 270 tablet 4  . potassium chloride (K-DUR,KLOR-CON) 10 MEQ tablet Take 2 tablets (20 mEq total) by mouth 2 (two) times daily. (Patient not taking: Reported on 07/22/2018) 120 tablet 0   No current facility-administered medications for this visit.     Past Medical History:  Diagnosis Date  . Anemia   . Anxiety   . Arthritis    ,Knees, back  . Congenital birth defect    Right hand  . Depression   . Endometrial cancer ([place])   . HLD (hyperlipidemia)    borderline  . Hx of echocardiogram    Echo (9/15): EF 50-55%, normal wall motion, grade 2 diastolic dysfunction, mild LAE  . Hx of radiation therapy 04/07/13- 05/14/13   pelvis 45 gray 25 fx  .  Hypertension   . Obesity   . Swelling    ANKLES - TAKES LASIX    Past Surgical History:  Procedure Laterality Date  . ABDOMINAL HYSTERECTOMY  10/2011   complete  . CHOLECYSTECTOMY N/A 07/08/2015   Procedure: LAPAROSCOPIC CHOLECYSTECTOMY WITH INTRAOPERATIVE CHOLANGIOGRAM;  Surgeon: Judeth Horn, MD;  Location: [facility];  Service: General;  Laterality: N/A;  . HIATAL HERNIA REPAIR    . REPAIR VAGINAL CUFF  10/07/2012   Procedure: REPAIR VAGINAL CUFF;  Surgeon: [name], MD PHD;  [facility];  Service: Gynecology;;  . ROBOTIC ASSISTED TOTAL HYSTERECTOMY WITH BILATERAL SALPINGO OOPHERECTOMY  10/07/2012   Procedure: ROBOTIC ASSISTED TOTAL HYSTERECTOMY WITH BILATERAL SALPINGO OOPHORECTOMY;  Surgeon: [name], MD PHD;  [facility];  Service: Gynecology;  Laterality: Bilateral;    ROS:  As stated in the HPI and negative for all other systems.  PHYSICAL EXAM BP 110/70   Pulse 91   Ht 5\' 6"  (1.676 m)   Wt (!) 308 lb (139.7 kg)   LMP 09/20/2012   BMI 49.71 kg/m  GENERAL:  Well appearing NECK:  No jugular venous distention, waveform within normal limits, carotid upstroke brisk and symmetric, no bruits, no thyromegaly LUNGS:  Clear to auscultation bilaterally CHEST:  Unremarkable HEART:  PMI not displaced or sustained,S1 and S2 within normal limits, no S3, no S4, no clicks, no rubs, no murmurs ABD:  Flat, positive bowel sounds normal in frequency in pitch, no bruits, no rebound, no guarding, no midline pulsatile mass, no hepatomegaly, no splenomegaly EXT:  2 plus pulses throughout, no edema, no cyanosis no clubbing   EKG:  Sinus rhythm, rate rate 91, axis within normal limits, intervals within normal limits, nonspecific inferior and anterior T wave changes.  ASSESSMENT AND PLAN  Chest pain - The patient is no longer describing any chest discomfort.  No further stress testing is indicated pending the results below.   Cardiomyopathy - He does have some symptoms of  dyspnea or some vague symptoms in the morning.  I am to check an echocardiogram and a BNP level.

## 2018-07-22 ENCOUNTER — Encounter

## 2018-07-22 ENCOUNTER — Ambulatory Visit (INDEPENDENT_AMBULATORY_CARE_PROVIDER_SITE_OTHER): Payer: Medicaid Other | Admitting: Cardiology

## 2018-07-22 ENCOUNTER — Other Ambulatory Visit: Payer: Self-pay

## 2018-07-22 ENCOUNTER — Encounter: Payer: Self-pay | Admitting: Cardiology

## 2018-07-22 VITALS — BP 110/70 | HR 91 | Ht 66.0 in | Wt 308.0 lb

## 2018-07-22 DIAGNOSIS — I429 Cardiomyopathy, unspecified: Secondary | ICD-10-CM | POA: Diagnosis not present

## 2018-07-22 DIAGNOSIS — R0602 Shortness of breath: Secondary | ICD-10-CM

## 2018-07-22 MED ORDER — CARVEDILOL 3.125 MG PO TABS: ORAL_TABLET | ORAL | 4 refills | Status: DC

## 2018-07-22 NOTE — Patient Instructions (Signed)
Medication Instructions:  Continue current medications  If you need a refill on your cardiac medications before your next appointment, please call your pharmacy.  Labwork: BMP HERE IN OUR OFFICE AT [facility]  Take the provided lab slips with you to the lab for your blood draw.   You will NOT need to fast   Testing/Procedures: Your physician has requested that you have an echocardiogram. Echocardiography is a painless test that uses sound waves to create images of your heart. It provides your doctor with information about the size and shape of your heart and how well your heart's chambers and valves are working. This procedure takes approximately one hour. There are no restrictions for this procedure.   Follow-Up: Your physician wants you to follow-up in: 1 Months with APP.       Thank you for choosing [facility] at [facility]!!

## 2018-07-23 LAB — BASIC METABOLIC PANEL
BUN/Creatinine Ratio: 10 (ref 9–23)
BUN: 11 mg/dL (ref 6–24)
CALCIUM: 9.1 mg/dL (ref 8.7–10.2)
CHLORIDE: 99 mmol/L (ref 96–106)
CO2: 20 mmol/L (ref 20–29)
Creatinine, Ser: 1.12 mg/dL — ABNORMAL HIGH (ref 0.57–1.00)
GFR calc Af Amer: 66 mL/min/{1.73_m2} (ref >59)
GFR calc non Af Amer: 57 mL/min/{1.73_m2} — ABNORMAL LOW (ref >59)
GLUCOSE: 120 mg/dL — AB (ref 65–99)
POTASSIUM: 3.9 mmol/L (ref 3.5–5.2)
Sodium: 135 mmol/L (ref 134–144)

## 2018-07-30 NOTE — Addendum Note (Signed)
Addended by: Jacqulynn Cadet on: 07/30/2018 08:36 AM   Modules accepted: Orders

## 2018-08-01 ENCOUNTER — Telehealth: Payer: Self-pay

## 2018-08-01 NOTE — Telephone Encounter (Signed)
Returning pt's VM requesting refill for Valium. Left VM that Dr. Sondra Come is out of office on Fridays and that I would have him send refill on Monday am. Loma Sousa, RN BSN

## 2018-08-05 ENCOUNTER — Telehealth: Payer: Self-pay

## 2018-08-05 NOTE — Telephone Encounter (Signed)
Pt upset with nursing staff that Valium was not refilled, instead pt would be required to see Dr. Sondra Come in office prior to refill of Valium. Pt stated that she could not attend f/u appt scheduled for 08/07/18 at 0850 due to "other doctors I have". Pt demanding appt afternoon 08/06/18.   Attempted to contact pt back with rescheduled appt. Call with straight to VM. Detailed VM left with new appt date and time: 08/06/18 at 1300. Loma Sousa, RN BSN

## 2018-08-06 ENCOUNTER — Other Ambulatory Visit: Payer: Self-pay

## 2018-08-06 ENCOUNTER — Encounter: Payer: Self-pay | Admitting: Radiation Oncology

## 2018-08-06 ENCOUNTER — Ambulatory Visit
Admission: RE | Admit: 2018-08-06 | Discharge: 2018-08-06 | Disposition: A | Discharge status: Home / Self Care | Payer: Medicaid Other | Source: Ambulatory Visit | Attending: Radiation Oncology | Admitting: Radiation Oncology

## 2018-08-06 VITALS — BP 103/84 | HR 92 | Temp 98.6°F | Wt 309.6 lb

## 2018-08-06 DIAGNOSIS — G8929 Other chronic pain: Secondary | ICD-10-CM | POA: Insufficient documentation

## 2018-08-06 DIAGNOSIS — Z6841 Body Mass Index (BMI) 40.0 and over, adult: Secondary | ICD-10-CM

## 2018-08-06 DIAGNOSIS — Z7982 Long term (current) use of aspirin: Secondary | ICD-10-CM | POA: Insufficient documentation

## 2018-08-06 DIAGNOSIS — Z923 Personal history of irradiation: Secondary | ICD-10-CM | POA: Insufficient documentation

## 2018-08-06 DIAGNOSIS — M549 Dorsalgia, unspecified: Secondary | ICD-10-CM | POA: Insufficient documentation

## 2018-08-06 DIAGNOSIS — M129 Arthropathy, unspecified: Secondary | ICD-10-CM | POA: Insufficient documentation

## 2018-08-06 DIAGNOSIS — C541 Malignant neoplasm of endometrium: Secondary | ICD-10-CM

## 2018-08-06 DIAGNOSIS — Z79899 Other long term (current) drug therapy: Secondary | ICD-10-CM | POA: Insufficient documentation

## 2018-08-06 DIAGNOSIS — Z8542 Personal history of malignant neoplasm of other parts of uterus: Secondary | ICD-10-CM

## 2018-08-06 MED ORDER — DIAZEPAM 5 MG PO TABS: 5.0000 mg | ORAL_TABLET | Freq: Three times a day (TID) | ORAL | 3 refills | Status: DC | PRN

## 2018-08-06 NOTE — Progress Notes (Signed)
Patient denies any pain at her radiation site. Denies any fatigue.Denies any diarrhea. Denies any nausea or vomiting. Denies any dysuria. Denies any urgency with urination. Denies any vaginal or rectal bleeding. Denies any vaginal discharge. Denies any issue with her skin. States that she is no longer using the dilator. Vitals:   08/06/18 1326  BP: 103/84  Pulse: 92  Temp: 98.6 F (37 C)  TempSrc: Oral  SpO2: 95%  Weight: (!) 309 lb 9.6 oz (140.4 kg)   Wt Readings from Last 3 Encounters:  08/06/18 (!) 309 lb 9.6 oz (140.4 kg)  07/22/18 (!) 308 lb (139.7 kg)  05/15/18 (!) 309 lb (140.2 kg)

## 2018-08-06 NOTE — Progress Notes (Signed)
Radiation Oncology         [phone] ________________________________ Name: Deborah [name] MRN: [id number]  Date: 10/03/2017  DOB: 06-25-1966  Follow-Up Visit Note  CC: Smiley Houseman, MD  Gordy Levan, MD Endometrial cancer [facility])  Diagnosis: Stage IIIC grade 2 endometrial adenocarcinoma   Radiation treatment dates: 04/07/2013-05/14/2013  Site/dose:   Pelvis 45 gray in 25 fractions  Interval Since Last Radiation:  5 years and 3 months   Narrative:  The patient returns today for routine follow-up following completion of her radiation therapy in 2014. She is doing well overall. She was recently evaluated by Dr. Skeet Latch in June 2019 and had a pelvic exam at that time. She has a follow up appointment with Dr. Skeet Latch in December 2020. She has stopped using her vaginal dilator at this time. She is taking one 5 mg Valium daily and is in need of a prescription refill today. She uses her valium during the day primarily and occasionally takes it at night time as well. Her PCP is Dr. Lovenia Kim at [facility].   Since the last visit to the office, she had a screening mammogram completed on 10/24/2017 that showed: No mammographic evidence of malignancy.    On review of systems, she reports chronic back pain due to arthritis. she denies vaginal bleeding and any other symptoms. Pertinent positives are listed and detailed within the above HPI. She continues to work on trying to lose weight.                            ALLERGIES:  is allergic to lactose intolerance (gi).  Meds:  Current Outpatient Medications  Medication Sig Dispense Refill  . acetaminophen (TYLENOL) 500 MG tablet Take 1,000 mg by mouth every 6 (six) hours as needed for moderate pain (pain).    [name] aspirin 81 MG tablet Take 81 mg by mouth every morning.     . bifidobacterium infantis (ALIGN) capsule Take 1 capsule by mouth daily as needed (for regularity). Reported on 02/23/2016    . carvedilol  (COREG) 3.125 MG tablet Take one (1) tablet (3.125 mg) by mouth each morning. Take two (2) tablets (6.25 mg) by mouth each evening. 270 tablet 4  . diazepam (VALIUM) 5 MG tablet Take 1 tablet (5 mg total) by mouth every 8 (eight) hours as needed for anxiety. 30 tablet 0  . fluticasone (FLONASE) 50 MCG/ACT nasal spray Place 2 sprays into both nostrils daily. 16 g 0  . furosemide (LASIX) 20 MG tablet Take 1 tablet (20 mg total) by mouth daily as needed for fluid. 30 tablet 0  . ibuprofen (ADVIL,MOTRIN) 800 MG tablet Take 1 tablet (800 mg total) by mouth every 8 (eight) hours as needed. 60 tablet 0  . lactase (LACTAID) 3000 UNITS tablet Take 1 tablet by mouth 3 (three) times daily as needed (for dairy intake). Reported on 02/23/2016    . loperamide (IMODIUM) 2 MG capsule Take 2 mg by mouth 4 (four) times daily as needed for diarrhea or loose stools (diarrhea). Reported on 02/23/2016    . magnesium chloride (SLOW-MAG) 64 MG TBEC SR tablet Take 1-2 tablets by mouth 2 (two) times daily. Takes two in the morning and one in the evening    . omeprazole (PRILOSEC) 20 MG capsule TAKE 1 CAPSULE(20 MG) BY MOUTH DAILY 30 capsule 3  . polyethylene glycol powder (GLYCOLAX/MIRALAX) powder Take 17 g by mouth daily as needed.  3350 g 1  . potassium chloride (K-DUR,KLOR-CON) 10 MEQ tablet Take 2 tablets (20 mEq total) by mouth 2 (two) times daily. 120 tablet 0  . sertraline (ZOLOFT) 100 MG tablet Take 1 tablet (100 mg total) by mouth every evening. 30 tablet 1   No current facility-administered medications for this encounter.      Physical Findings: The patient is in no acute distress. Patient is alert and oriented. Vitals:   08/06/18 1326  BP: 103/84  Pulse: 92  Temp: 98.6 F (37 C)  TempSrc: Oral  SpO2: 95%  Weight: (!) 309 lb 9.6 oz (140.4 kg)   Lungs are clear to auscultation bilaterally. Heart has regular rate and rhythm. No palpable cervical, supraclavicular, or axillary adenopathy. Abdomen soft,  non-tender, normal bowel sounds. Pelvic exam deferred today due to patient recently having a pelvic exam by Dr. Skeet Latch in June. She will be returning in November.    Lab Findings: Recent Results (from the past 2160 hour(s))  Basic Metabolic Panel (BMET)     Status: Abnormal   Collection Time: 07/22/18  4:23 PM  Result Value Ref Range   Glucose 120 (H) 65 - 99 mg/dL   BUN 11 6 - 24 mg/dL   Creatinine, Ser 1.12 (H) 0.57 - 1.00 mg/dL   GFR calc non Af Amer 57 (L) >59 mL/min/1.73   GFR calc Af Amer 66 >59 mL/min/1.73   BUN/Creatinine Ratio 10 9 - 23   Sodium 135 134 - 144 mmol/L   Potassium 3.9 3.5 - 5.2 mmol/L   Chloride 99 96 - 106 mmol/L   CO2 20 20 - 29 mmol/L   Calcium 9.1 8.7 - 10.2 mg/dL    Radiographic Findings: No results found.  Impression: Clinically stable. Pelvic exam deferred today at patients request and it is a little early for an exam.    Discussed that she will need to have her valium prescription refilled by her PCP once she is released from the clinic.   Plan:  She will return in November for follow up.  ____________________________________ -----------------------------------  Blair Promise, PhD, MD    This document serves as a record of services personally performed by Gery Pray, MD. It was created on his behalf by [facility], a trained medical scribe. The creation of this record is based on the scribe's personal observations and the provider's statements to them. This document has been checked and approved by the attending provider.

## 2018-08-07 ENCOUNTER — Ambulatory Visit (HOSPITAL_COMMUNITY): Payer: Medicaid Other

## 2018-08-07 ENCOUNTER — Ambulatory Visit: Payer: Medicaid Other | Admitting: Radiation Oncology

## 2018-08-14 ENCOUNTER — Other Ambulatory Visit: Payer: Self-pay

## 2018-08-14 ENCOUNTER — Ambulatory Visit (HOSPITAL_COMMUNITY): Payer: Medicaid Other | Attending: Cardiology

## 2018-08-14 ENCOUNTER — Encounter: Payer: Self-pay | Admitting: *Deleted

## 2018-08-14 DIAGNOSIS — I10 Essential (primary) hypertension: Secondary | ICD-10-CM | POA: Insufficient documentation

## 2018-08-14 DIAGNOSIS — E785 Hyperlipidemia, unspecified: Secondary | ICD-10-CM | POA: Diagnosis not present

## 2018-08-14 DIAGNOSIS — Z6841 Body Mass Index (BMI) 40.0 and over, adult: Secondary | ICD-10-CM | POA: Insufficient documentation

## 2018-08-14 DIAGNOSIS — I429 Cardiomyopathy, unspecified: Secondary | ICD-10-CM | POA: Diagnosis not present

## 2018-08-14 DIAGNOSIS — R0602 Shortness of breath: Secondary | ICD-10-CM

## 2018-08-14 DIAGNOSIS — E669 Obesity, unspecified: Secondary | ICD-10-CM | POA: Insufficient documentation

## 2018-08-20 ENCOUNTER — Encounter: Payer: Self-pay | Admitting: Adult Health

## 2018-08-20 ENCOUNTER — Ambulatory Visit (INDEPENDENT_AMBULATORY_CARE_PROVIDER_SITE_OTHER): Payer: Medicaid Other | Admitting: Adult Health

## 2018-08-20 VITALS — BP 111/79 | HR 98 | Ht 66.0 in | Wt 309.0 lb

## 2018-08-20 DIAGNOSIS — I5042 Chronic combined systolic (congestive) and diastolic (congestive) heart failure: Secondary | ICD-10-CM | POA: Diagnosis not present

## 2018-08-20 DIAGNOSIS — I10 Essential (primary) hypertension: Secondary | ICD-10-CM

## 2018-08-20 NOTE — Progress Notes (Signed)
Cardiology Office Note   Date:  08/20/2018   ID:  Deborah [name], DOB 02/06/1966, MRN [id number]  PCP:  Lovenia Kim, MD  Cardiologist:  Hochrein  No chief complaint on file.    History of Present Illness: Deborah [name] is a 52 y.o. female who presents for ongoing assessment and management of systolic CHF, with reduced EF of 47%, with dobutamine stress test revealed EF of 35%. She has NYHA Class II-III dyspnea. She was last seen by cardiology on 07/22/2018 with plans for repeat echo and BNP level. Other history includes anemia, hypertension, and hyperlipidemia.   Echo 08/14/2018 Left ventricle: The cavity size was normal. Systolic function was   mildly reduced. The estimated ejection fraction was in the range   of 45% to 50%. Diffuse hypokinesis. Doppler parameters are   consistent with abnormal left ventricular relaxation (grade 1   diastolic dysfunction). There was no evidence of elevated   ventricular filling pressure by Doppler parameters. - Aortic valve: There was no regurgitation. - Aortic root: The aortic root was normal in size. - Mitral valve: There was mild regurgitation. - Right ventricle: Systolic function was normal. - Right atrium: The atrium was normal in size. - Tricuspid valve: There was mild regurgitation. - Pulmonary arteries: Systolic pressure was within the normal   range. - Inferior vena cava: The vessel was normal in size. - Pericardium, extracardiac: There was no pericardial effusion  She denies dyspnea, chest pain or dizziness. No palpitations.   Past Medical History:  Diagnosis Date  . Anemia   . Anxiety   . Arthritis    ,Knees, back  . Congenital birth defect    Right hand  . Depression   . Endometrial cancer ([place])   . HLD (hyperlipidemia)    borderline  . Hx of echocardiogram    Echo (9/15): EF 50-55%, normal wall motion, grade 2 diastolic dysfunction, mild LAE  . Hx of radiation therapy 04/07/13- 05/14/13   pelvis 45 gray 25 fx  .  Hypertension   . Obesity   . Swelling    ANKLES - TAKES LASIX    Past Surgical History:  Procedure Laterality Date  . ABDOMINAL HYSTERECTOMY  10/2011   complete  . CHOLECYSTECTOMY N/A 07/08/2015   Procedure: LAPAROSCOPIC CHOLECYSTECTOMY WITH INTRAOPERATIVE CHOLANGIOGRAM;  Surgeon: Judeth Horn, MD;  Location: [facility];  Service: General;  Laterality: N/A;  . HIATAL HERNIA REPAIR    . REPAIR VAGINAL CUFF  10/07/2012   Procedure: REPAIR VAGINAL CUFF;  Surgeon: [name], MD PHD;  Location: [facility];  Service: Gynecology;;  . ROBOTIC ASSISTED TOTAL HYSTERECTOMY WITH BILATERAL SALPINGO OOPHERECTOMY  10/07/2012   Procedure: ROBOTIC ASSISTED TOTAL HYSTERECTOMY WITH BILATERAL SALPINGO OOPHORECTOMY;  Surgeon: [name], MD PHD;  Location: [facility];  Service: Gynecology;  Laterality: Bilateral;     Current Outpatient Medications  Medication Sig Dispense Refill  . acetaminophen (TYLENOL) 500 MG tablet Take 1,000 mg by mouth every 6 (six) hours as needed for moderate pain (pain).    [name] aspirin 81 MG tablet Take 81 mg by mouth every morning.     . bifidobacterium infantis (ALIGN) capsule Take 1 capsule by mouth daily as needed (for regularity). Reported on 02/23/2016    . carvedilol (COREG) 3.125 MG tablet Take one (1) tablet (3.125 mg) by mouth each morning. Take two (2) tablets (6.25 mg) by mouth each evening. 270 tablet 4  . diazepam (VALIUM) 5 MG tablet Take 1 tablet (5 mg total) by mouth every 8 (  eight) hours as needed for anxiety. 30 tablet 3  . fluticasone (FLONASE) 50 MCG/ACT nasal spray Place 2 sprays into both nostrils daily. 16 g 0  . furosemide (LASIX) 20 MG tablet Take 1 tablet (20 mg total) by mouth daily as needed for fluid. 30 tablet 0  . ibuprofen (ADVIL,MOTRIN) 800 MG tablet Take 1 tablet (800 mg total) by mouth every 8 (eight) hours as needed. 60 tablet 0  . lactase (LACTAID) 3000 UNITS tablet Take 1 tablet by mouth 3 (three) times daily as needed (for dairy intake). Reported on  02/23/2016    . loperamide (IMODIUM) 2 MG capsule Take 2 mg by mouth 4 (four) times daily as needed for diarrhea or loose stools (diarrhea). Reported on 02/23/2016    . magnesium chloride (SLOW-MAG) 64 MG TBEC SR tablet Take 1-2 tablets by mouth 2 (two) times daily. Takes two in the morning and one in the evening    . omeprazole (PRILOSEC) 20 MG capsule TAKE 1 CAPSULE(20 MG) BY MOUTH DAILY 30 capsule 3  . polyethylene glycol powder (GLYCOLAX/MIRALAX) powder Take 17 g by mouth daily as needed. 3350 g 1  . potassium chloride (K-DUR,KLOR-CON) 10 MEQ tablet Take 2 tablets (20 mEq total) by mouth 2 (two) times daily. 120 tablet 0  . sertraline (ZOLOFT) 100 MG tablet Take 1 tablet (100 mg total) by mouth every evening. 30 tablet 1   No current facility-administered medications for this visit.     Allergies:   Lactose intolerance (gi)    Social History:  The patient  reports that she has never smoked. She has never used smokeless tobacco. She reports that she does not drink alcohol or use drugs.   Family History:  The patient's family history includes Cancer in her cousin, maternal aunt, and maternal grandfather; Colon cancer in her cousin; Colon polyps in her maternal aunt; Diabetes in her mother; Hypertension in her maternal grandfather and maternal grandmother; Lung cancer in her maternal aunt; Pancreatic cancer in her maternal grandfather; Stroke in her cousin.    ROS: All other systems are reviewed and negative. Unless otherwise mentioned in H&P    PHYSICAL EXAM: VS:  LMP 09/20/2012  , BMI There is no height or weight on file to calculate BMI. GEN: Well nourished, well developed, in no acute distress HEENT: normal Neck: no JVD, carotid bruits, or masses Cardiac: RRR; no murmurs, rubs, or gallops,no edema  Respiratory:  Clear to auscultation bilaterally, normal work of breathing GI: soft, nontender, nondistended, + BS MS: no deformity or atrophy Skin: warm and dry, no rash Neuro:  Strength  and sensation are intact Psych: euthymic mood, full affect   EKG:  Not competed this office visit.   Recent Labs: 09/18/2017: Magnesium 1.4 07/22/2018: BUN 11; Creatinine, Ser 1.12; Potassium 3.9; Sodium 135    Lipid Panel    Component Value Date/Time   CHOL 162 07/17/2013 0923   TRIG 152 (H) 07/17/2013 0923   HDL 32 (L) 07/17/2013 0923   CHOLHDL 5.1 07/17/2013 0923   VLDL 30 07/17/2013 0923   LDLCALC 100 (H) 07/17/2013 0923   LDLDIRECT 130 (H) 02/14/2012 1424      Wt Readings from Last 3 Encounters:  08/06/18 (!) 309 lb 9.6 oz (140.4 kg)  07/22/18 (!) 308 lb (139.7 kg)  05/15/18 (!) 309 lb (140.2 kg)      Other studies Reviewed: Echocardiogram 2018/09/08 Left ventricle: The cavity size was normal. Systolic function was   mildly reduced. The estimated ejection fraction was  in the range   of 45% to 50%. Diffuse hypokinesis. Doppler parameters are   consistent with abnormal left ventricular relaxation (grade 1   diastolic dysfunction). There was no evidence of elevated   ventricular filling pressure by Doppler parameters. - Aortic valve: There was no regurgitation. - Aortic root: The aortic root was normal in size. - Mitral valve: There was mild regurgitation. - Right ventricle: Systolic function was normal. - Right atrium: The atrium was normal in size. - Tricuspid valve: There was mild regurgitation. - Pulmonary arteries: Systolic pressure was within the normal   range. - Inferior vena cava: The vessel was normal in size. - Pericardium, extracardiac: There was no pericardial effusion.  Impressions:  - There is no significant change since the prior study on   03/05/2017. LV systolic function remains mildly diffusely   decreased with LVEF estimated at 45-50%. Normal filling   pressures.  ASSESSMENT AND PLAN:  1.  HRrEF: She has no evidence of volume overload.Repeat echo demonstrates mild reduction in EF from 55% to 45%-50%. She is completely asymptomatic,  medically compliant.  Continue current regimen.  Labs reviewed and are essentially normal.   2. Hypertension: BP is well controlled currently. Continue coreg.   3. Anemia: Followed by PCP   Current medicines are reviewed at length with the patient today.    Labs/ tests ordered today include: None  Phill Myron. [place] Pugh, ANP, AACC   08/20/2018 7:25 AM    [address] Office [phone] Fax [phone]

## 2018-08-20 NOTE — Patient Instructions (Signed)
Medication Instructions:  NO CHANGES- Your physician recommends that you continue on your current medications as directed. Please refer to the Current Medication list given to you today.  If you need a refill on your cardiac medications before your next appointment, please call your pharmacy.  Follow-Up: Your physician wants you to follow-up in: [place]. You should receive a reminder letter in the mail two months in advance. If you do not receive a letter, please call our office in JAN 2020 to schedule your [facility] 2020 follow-up appointment.   Thank you for choosing [facility] at [facility]!!

## 2018-08-26 ENCOUNTER — Telehealth: Payer: Self-pay | Admitting: Cardiology

## 2018-08-26 NOTE — Telephone Encounter (Signed)
PT AWARE OF ECHO RESULTS HAD APPT WITH KATHRYN LAWRENCE NP ON 08/20/18 .Adonis Housekeeper

## 2018-08-26 NOTE — Telephone Encounter (Signed)
New Message ° ° ° ° ° ° ° ° ° °Patient returned your call °

## 2018-09-09 ENCOUNTER — Telehealth: Payer: Self-pay | Admitting: Family Medicine

## 2018-09-09 ENCOUNTER — Ambulatory Visit: Payer: Medicaid Other | Admitting: Family Medicine

## 2018-09-09 NOTE — Telephone Encounter (Signed)
Left message for pt regarding rescheduling missed  appt.

## 2018-10-06 ENCOUNTER — Ambulatory Visit: Payer: Self-pay | Admitting: Radiation Oncology

## 2018-10-07 ENCOUNTER — Telehealth: Payer: Self-pay | Admitting: *Deleted

## 2018-10-07 NOTE — Telephone Encounter (Signed)
On 10-07-18 pt call cancel appt for 10-09-18 because father is sick, will call back to reschedule when she gets back in town.

## 2018-10-09 ENCOUNTER — Ambulatory Visit: Payer: Medicaid Other | Admitting: Radiation Oncology

## 2018-12-01 ENCOUNTER — Other Ambulatory Visit: Payer: Self-pay | Admitting: Family Medicine

## 2018-12-01 DIAGNOSIS — Z1231 Encounter for screening mammogram for malignant neoplasm of breast: Secondary | ICD-10-CM

## 2018-12-04 ENCOUNTER — Ambulatory Visit
Admission: RE | Admit: 2018-12-04 | Discharge: 2018-12-04 | Disposition: A | Discharge status: Home / Self Care | Payer: Medicaid Other | Source: Ambulatory Visit | Attending: Family Medicine | Admitting: Family Medicine

## 2018-12-04 ENCOUNTER — Other Ambulatory Visit: Payer: Self-pay | Admitting: Radiation Oncology

## 2018-12-04 DIAGNOSIS — Z1231 Encounter for screening mammogram for malignant neoplasm of breast: Secondary | ICD-10-CM

## 2018-12-04 DIAGNOSIS — C541 Malignant neoplasm of endometrium: Secondary | ICD-10-CM

## 2018-12-04 MED ORDER — DIAZEPAM 5 MG PO TABS: 5.0000 mg | ORAL_TABLET | Freq: Three times a day (TID) | ORAL | 3 refills | Status: DC | PRN

## 2018-12-24 ENCOUNTER — Encounter

## 2018-12-24 ENCOUNTER — Ambulatory Visit (INDEPENDENT_AMBULATORY_CARE_PROVIDER_SITE_OTHER): Payer: Medicaid Other | Admitting: Family Medicine

## 2018-12-24 ENCOUNTER — Other Ambulatory Visit: Payer: Self-pay

## 2018-12-24 ENCOUNTER — Encounter: Payer: Self-pay | Admitting: Family Medicine

## 2018-12-24 VITALS — BP 110/80 | HR 86 | Temp 98.8°F | Ht 66.0 in | Wt 306.2 lb

## 2018-12-24 DIAGNOSIS — Z8542 Personal history of malignant neoplasm of other parts of uterus: Secondary | ICD-10-CM | POA: Diagnosis not present

## 2018-12-24 DIAGNOSIS — Z7689 Persons encountering health services in other specified circumstances: Secondary | ICD-10-CM | POA: Diagnosis not present

## 2018-12-24 NOTE — Progress Notes (Signed)
   Subjective:   Patient ID: Deborah [name]    DOB: 1966/07/02, 53 y.o. female   MRN: [id number]  CC: meet new PCP, review medical hx   HPI: Deborah [name] is a 53 y.o. female who presents to clinic today for the following issue.  Here to meet new PCP today with no current concerns.  Medical hx and medications reviewed.    PMHx: History of endometrial ca, she is a cancer survivor, anxiety, depression, chronic back pain, OA of bilateral knees and morbid obesity Surgical Hx: open chole in 2016, hernia repair ~2006, hysterectomy FHx: HTN, cardiac disease, strong family h/o cancer   Medications: zoloft, prilosec, valium, flonase, coreg, lasix prn, ibuprofen prn, imodium prn, miralax prn   Health maintenance issues:  -due for HIV screen, tdap, colonoscopy and flu shot  ROS: No fever, chills, nausea, vomiting.  No CP, SOB, leg swelling.    Social: pt is a never smoker.  Medications reviewed. Objective:   BP 110/80   Pulse 86   Temp 98.8 F (37.1 C) (Oral)   Ht 5\' 6"  (1.676 m)   Wt (!) 306 lb 3.2 oz (138.9 kg)   LMP 09/20/2012   SpO2 99%   BMI 49.42 kg/m  Vitals and nursing note reviewed.  General: pleasant 53 yo female, NAD  HEENT: NCAT, EOMI, PERRL, MMM, oropharynx clear  Neck: supple, normal ROM  CV: RRR no MRG  Lungs: CTAB, normal effort  Abdomen: soft, NTND, +bs  Skin: warm, dry, no rash Extremities: warm and well perfused, normal tone Neuro: alert, oriented x3, no focal deficits   Assessment & Plan:   53 yo female presents to clinic to establish care with new doctor.  She has no current concerns.   Medical hx, family and social hx reviewed.  She does not need any medication refills.   Health maintenance issues not addressed due to visit length.   She would like to return in March to complete these and discuss additional issues at that time.   Lovenia Kim, MD [place] PGY-3

## 2018-12-25 ENCOUNTER — Telehealth: Payer: Self-pay | Admitting: *Deleted

## 2018-12-25 NOTE — Telephone Encounter (Signed)
CALLED PATIENT TO CHANGE FU APPT., APPT. RESCHEDULED FOR 01-26-19 @ 4:30 PM, LVM FOR A RETURN CALL

## 2018-12-29 ENCOUNTER — Ambulatory Visit: Payer: Medicaid Other | Admitting: Radiation Oncology

## 2019-01-02 ENCOUNTER — Other Ambulatory Visit: Payer: Self-pay | Admitting: Internal Medicine

## 2019-01-07 ENCOUNTER — Telehealth: Payer: Self-pay | Admitting: *Deleted

## 2019-01-07 NOTE — Telephone Encounter (Signed)
RETURNED PATIENT'S PHONE CALL, LVM FOR A RETURN CALL 

## 2019-01-26 ENCOUNTER — Ambulatory Visit: Payer: Medicaid Other | Admitting: Radiation Oncology

## 2019-02-02 ENCOUNTER — Ambulatory Visit: Payer: Medicaid Other | Admitting: Radiation Oncology

## 2019-02-18 ENCOUNTER — Ambulatory Visit: Payer: Medicaid Other | Admitting: Cardiology

## 2019-03-17 ENCOUNTER — Telehealth: Payer: Self-pay

## 2019-03-17 NOTE — Telephone Encounter (Signed)
LEFT MESSAGE TO CALL BACK

## 2019-03-27 ENCOUNTER — Telehealth: Payer: Self-pay

## 2019-03-27 NOTE — Telephone Encounter (Signed)
LVM FOR PT TO RETURN CALL TO OFFICE TO MAKE APPT VIRTUAL

## 2019-03-31 ENCOUNTER — Other Ambulatory Visit: Payer: Self-pay | Admitting: Family Medicine

## 2019-03-31 DIAGNOSIS — C541 Malignant neoplasm of endometrium: Secondary | ICD-10-CM

## 2019-04-01 ENCOUNTER — Telehealth: Payer: Self-pay | Admitting: Cardiology

## 2019-04-01 NOTE — Telephone Encounter (Signed)
Returned pt call.lmtcb 

## 2019-04-01 NOTE — Telephone Encounter (Signed)
New Message   Patient not feeling well canceling tomorrow's appointment and would like to speak to a nurse.

## 2019-04-02 ENCOUNTER — Ambulatory Visit: Payer: Medicaid Other | Admitting: Cardiology

## 2019-04-02 ENCOUNTER — Telehealth: Payer: Self-pay

## 2019-04-02 NOTE — Telephone Encounter (Signed)
Patient called nurse line stating she usually gets a 90 day supply of Valium. Please advise.

## 2019-04-02 NOTE — Telephone Encounter (Signed)
Pt is calling to check on the status of having her Diazepam 5mg  refilled.

## 2019-04-03 ENCOUNTER — Telehealth: Payer: Self-pay | Admitting: Cardiology

## 2019-04-03 NOTE — Telephone Encounter (Signed)
LVM to schedule recall. °

## 2019-04-06 NOTE — Telephone Encounter (Signed)
Returned call to patient no answer.LMTC. 

## 2019-04-07 NOTE — Telephone Encounter (Signed)
LM FOR PT TO CALL BACK. WILL AWAIT PHONE [place]

## 2019-04-09 ENCOUNTER — Other Ambulatory Visit: Payer: Self-pay | Admitting: Family Medicine

## 2019-04-09 DIAGNOSIS — C541 Malignant neoplasm of endometrium: Secondary | ICD-10-CM

## 2019-04-09 MED ORDER — DIAZEPAM 5 MG PO TABS: ORAL_TABLET | ORAL | 0 refills | Status: DC

## 2019-04-09 NOTE — Telephone Encounter (Signed)
Refill sent to [facility] on [address], please inform pt thanks

## 2019-04-10 NOTE — Telephone Encounter (Signed)
Attempted to call pt multiple times, no answer. Deseree Kennon Holter, CMA

## 2019-04-15 ENCOUNTER — Telehealth: Payer: Self-pay | Admitting: Cardiology

## 2019-04-15 NOTE — Progress Notes (Signed)
Virtual Visit via Telephone Note   This visit type was conducted due to national recommendations for restrictions regarding the COVID-19 Pandemic (e.g. social distancing) in an effort to limit this patient's exposure and mitigate transmission in our community.  Due to her co-morbid illnesses, this patient is at least at moderate risk for complications without adequate follow up.  This format is felt to be most appropriate for this patient at this time.  The patient did not have access to video technology/had technical difficulties with video requiring transitioning to audio format only (telephone).  All issues noted in this document were discussed and addressed.  No physical exam could be performed with this format.  Please refer to the patient's chart for her  consent to telehealth for [facility].   Date:  04/16/2019   ID:  Deborah [name], DOB Dec 29, 1965, MRN [id number]  Patient Location: Home Provider Location: Home  PCP:  Lovenia Kim, MD  Cardiologist:  Minus Breeding, MD  Electrophysiologist:  None   Evaluation Performed:  Follow-Up Visit  Chief Complaint:  Cardiomyopathy  History of Present Illness:    Deborah [name] is a 53 y.o. female presents  for follow up of a reduced EF.  Marland KitchenShe was seen here in 2000 and and did have a stress perfusion study. This demonstrated possibly a reduced ejection fraction of 47%. There was artifact. There was perhaps some hypoperfusion of the anterior septum. Cardiac catheterization was planned but this did not happen. I saw her in 2016 and I ordered dobutamine echocardiogram. This confirmed the ejection fraction was somewhat low at 35%. I titrated meds and in 2016 her EF was low normal (45 - 50%).     Since I last saw her she has done well.  She still has a sensation that she is a little SOB and wakes up with her heart racing.  I note that she had sleep apnea diagnosed and this was severe.  However, she says she is never had a follow-up which was  the fitting of the mask.  She is tried to look into this but has never been called back.  She does not describe PND or orthopnea.  She does not describe new palpitations, presyncope or syncope.  She is had no weight gain or edema.  The patient does not have symptoms concerning for COVID-19 infection (fever, chills, cough, or new shortness of breath).    Past Medical History:  Diagnosis Date  . Anemia   . Anxiety   . Arthritis    ,Knees, back  . Congenital birth defect    Right hand  . Depression   . Endometrial cancer ([place])   . HLD (hyperlipidemia)    borderline  . Hx of echocardiogram    Echo (9/15): EF 50-55%, normal wall motion, grade 2 diastolic dysfunction, mild LAE  . Hx of radiation therapy 04/07/13- 05/14/13   pelvis 45 gray 25 fx  . Hypertension   . Obesity   . Swelling    ANKLES - TAKES LASIX   Past Surgical History:  Procedure Laterality Date  . ABDOMINAL HYSTERECTOMY  10/2011   complete  . CHOLECYSTECTOMY N/A 07/08/2015   Procedure: LAPAROSCOPIC CHOLECYSTECTOMY WITH INTRAOPERATIVE CHOLANGIOGRAM;  Surgeon: Judeth Horn, MD;  Location: [facility];  Service: General;  Laterality: N/A;  . HIATAL HERNIA REPAIR    . REPAIR VAGINAL CUFF  10/07/2012   Procedure: REPAIR VAGINAL CUFF;  Surgeon: [name], MD PHD;  Location: [facility];  Service: Gynecology;;  . ROBOTIC  ASSISTED TOTAL HYSTERECTOMY WITH BILATERAL SALPINGO OOPHERECTOMY  10/07/2012   Procedure: ROBOTIC ASSISTED TOTAL HYSTERECTOMY WITH BILATERAL SALPINGO OOPHORECTOMY;  Surgeon: [name], MD PHD;  Location: [facility];  Service: Gynecology;  Laterality: Bilateral;     Current Meds  Medication Sig  . acetaminophen (TYLENOL) 500 MG tablet Take 1,000 mg by mouth every 6 (six) hours as needed for moderate pain (pain).  . bifidobacterium infantis (ALIGN) capsule Take 1 capsule by mouth daily as needed (for regularity). Reported on 02/23/2016  . carvedilol (COREG) 3.125 MG tablet Take one (1) tablet (3.125 mg) by mouth each  morning. Take two (2) tablets (6.25 mg) by mouth each evening.  . diazepam (VALIUM) 5 MG tablet TAKE 1 TABLET(5 MG) BY MOUTH EVERY 8 HOURS AS NEEDED FOR ANXIETY  . fluticasone (FLONASE) 50 MCG/ACT nasal spray Place 2 sprays into both nostrils daily.  . furosemide (LASIX) 20 MG tablet Take 1 tablet (20 mg total) by mouth daily as needed for fluid.  [name] ibuprofen (ADVIL,MOTRIN) 800 MG tablet Take 1 tablet (800 mg total) by mouth every 8 (eight) hours as needed.  . lactase (LACTAID) 3000 UNITS tablet Take 1 tablet by mouth 3 (three) times daily as needed (for dairy intake). Reported on 02/23/2016  . loperamide (IMODIUM) 2 MG capsule Take 2 mg by mouth 4 (four) times daily as needed for diarrhea or loose stools (diarrhea). Reported on 02/23/2016  . magnesium chloride (SLOW-MAG) 64 MG TBEC SR tablet Take 1-2 tablets by mouth 2 (two) times daily. Takes two in the morning and one in the evening  . omeprazole (PRILOSEC) 20 MG capsule TAKE 1 CAPSULE(20 MG) BY MOUTH DAILY  . polyethylene glycol powder (GLYCOLAX/MIRALAX) powder Take 17 g by mouth daily as needed.  . potassium chloride (K-DUR,KLOR-CON) 10 MEQ tablet Take 2 tablets (20 mEq total) by mouth 2 (two) times daily.  . sertraline (ZOLOFT) 100 MG tablet Take 1 tablet (100 mg total) by mouth every evening.     Allergies:   Lactose intolerance (gi)   Social History   Tobacco Use  . Smoking status: Never Smoker  . Smokeless tobacco: Never Used  Substance Use Topics  . Alcohol use: No    Comment: rare  . Drug use: No     Family Hx: The patient's family history includes Cancer in her cousin, maternal aunt, and maternal grandfather; Colon cancer in her cousin; Colon polyps in her maternal aunt; Diabetes in her mother; Hypertension in her maternal grandfather and maternal grandmother; Lung cancer in her maternal aunt; Pancreatic cancer in her maternal grandfather; Stroke in her cousin. There is no history of Heart attack.  ROS:   Please see the  history of present illness.    As stated in the HPI and negative for all other systems.   Prior CV studies:   The following studies were reviewed today:  Sleep report  Labs/Other Tests and Data Reviewed:    EKG:  No ECG reviewed.  Recent Labs: 07/22/2018: BUN 11; Creatinine, Ser 1.12; Potassium 3.9; Sodium 135   Recent Lipid Panel Lab Results  Component Value Date/Time   CHOL 162 07/17/2013 09:23 AM   TRIG 152 (H) 07/17/2013 09:23 AM   HDL 32 (L) 07/17/2013 09:23 AM   CHOLHDL 5.1 07/17/2013 09:23 AM   LDLCALC 100 (H) 07/17/2013 09:23 AM   LDLDIRECT 130 (H) 02/14/2012 02:24 PM    Wt Readings from Last 3 Encounters:  04/16/19 (!) 309 lb (140.2 kg)  12/24/18 (!) 306 lb 3.2 oz (  138.9 kg)  08/20/18 (!) 309 lb (140.2 kg)     Objective:    Vital Signs:  Ht 5\' 6"  (1.676 m)   Wt (!) 309 lb (140.2 kg)   LMP 09/20/2012   BMI 49.87 kg/m    VITAL SIGNS:  reviewed  ASSESSMENT & PLAN:    CHEST PAIN:  She is no longer having this.  No further work up is suggested.    CARDIOMYOPATHY:  Her EF was mildly low previously.  No change in therapy.     SLEEP APNEA :  I sent a message to Lovenia Kim, MD to look into follow up of this.     COVID-19 Education: The signs and symptoms of COVID-19 were discussed with the patient and how to seek care for testing (follow up with PCP or arrange E-visit).  The importance of social distancing was discussed today.  Time:   Today, I have spent 16 minutes with the patient with telehealth technology discussing the above problems.     Medication Adjustments/Labs and Tests Ordered: Current medicines are reviewed at length with the patient today.  Concerns regarding medicines are outlined above.   Tests Ordered: No orders of the defined types were placed in this encounter.   Medication Changes: No orders of the defined types were placed in this encounter.   Disposition:  Follow up with me in one year  Signed, Minus Breeding, MD   04/16/2019 12:43 PM    [employer]

## 2019-04-15 NOTE — Telephone Encounter (Signed)
Called to pre-reg, went straight to voicemail. LM 04/15/2019 MS

## 2019-04-16 ENCOUNTER — Encounter: Payer: Self-pay | Admitting: Cardiology

## 2019-04-16 ENCOUNTER — Telehealth (INDEPENDENT_AMBULATORY_CARE_PROVIDER_SITE_OTHER): Payer: Medicaid Other | Admitting: Cardiology

## 2019-04-16 VITALS — Ht 66.0 in | Wt 309.0 lb

## 2019-04-16 DIAGNOSIS — G4730 Sleep apnea, unspecified: Secondary | ICD-10-CM | POA: Insufficient documentation

## 2019-04-16 DIAGNOSIS — I5042 Chronic combined systolic (congestive) and diastolic (congestive) heart failure: Secondary | ICD-10-CM | POA: Insufficient documentation

## 2019-04-16 DIAGNOSIS — Z7189 Other specified counseling: Secondary | ICD-10-CM

## 2019-04-16 HISTORY — DX: Other specified counseling: Z71.89

## 2019-04-16 MED ORDER — CARVEDILOL 3.125 MG PO TABS: ORAL_TABLET | ORAL | 3 refills | Status: DC

## 2019-04-16 NOTE — Patient Instructions (Signed)

## 2019-06-02 ENCOUNTER — Other Ambulatory Visit: Payer: Self-pay | Admitting: Family Medicine

## 2019-06-02 DIAGNOSIS — C541 Malignant neoplasm of endometrium: Secondary | ICD-10-CM

## 2019-06-02 MED ORDER — DIAZEPAM 5 MG PO TABS: ORAL_TABLET | ORAL | 0 refills | Status: DC

## 2019-06-02 NOTE — Telephone Encounter (Signed)
Pt called to get a refill on her medication diazepam. Please give pt a call back.

## 2019-06-02 NOTE — Telephone Encounter (Signed)
Please call patient and have her schedule to discuss her anxiety and met new primary care provider. I have sent a 30 day supply in of diazepam.

## 2019-06-03 NOTE — Telephone Encounter (Signed)
LMOVM informing pt and told her to call the office back to make an appt. Deseree Kennon Holter, CMA

## 2019-06-29 ENCOUNTER — Other Ambulatory Visit: Payer: Self-pay

## 2019-06-29 DIAGNOSIS — C541 Malignant neoplasm of endometrium: Secondary | ICD-10-CM

## 2019-06-30 MED ORDER — DIAZEPAM 5 MG PO TABS: ORAL_TABLET | ORAL | 0 refills | Status: DC

## 2019-06-30 NOTE — Telephone Encounter (Signed)
Patient will need to schedule a follow-up appointment to discuss her anxiety and further necessity for Valium.

## 2019-07-03 ENCOUNTER — Other Ambulatory Visit: Payer: Self-pay | Admitting: *Deleted

## 2019-07-05 MED ORDER — OMEPRAZOLE 20 MG PO CPDR: DELAYED_RELEASE_CAPSULE | ORAL | 0 refills | Status: DC

## 2019-07-06 IMAGING — MG DIGITAL SCREENING BILATERAL MAMMOGRAM WITH CAD
7 series · 7 of 7 positions shown · non-contrast
Comparison: Previous exam(s).

CLINICAL DATA: Screening.

EXAM:
DIGITAL SCREENING BILATERAL MAMMOGRAM WITH CAD

[R MLO (1 of 2)]
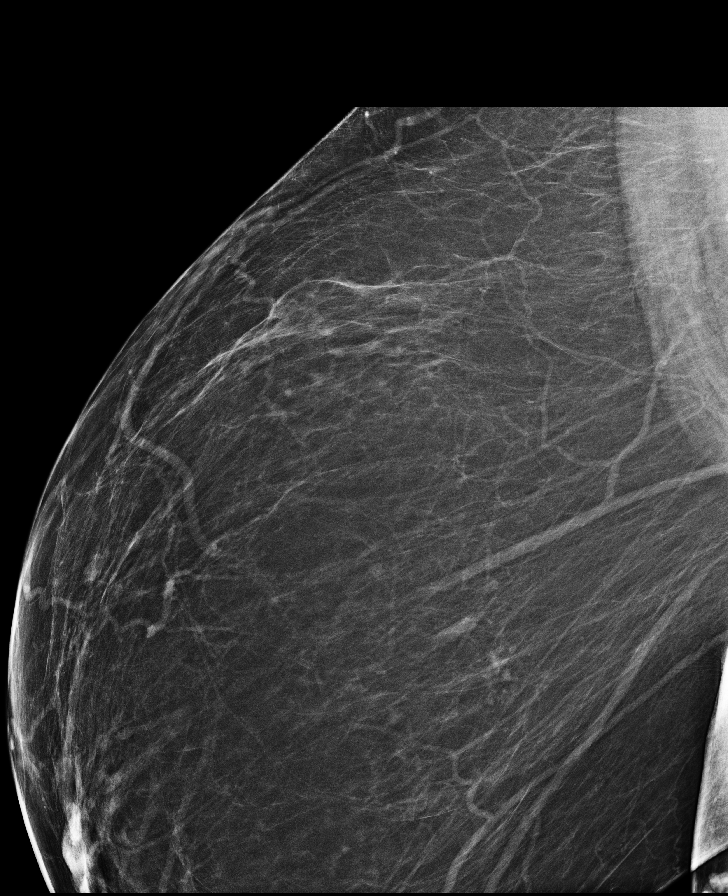

[R MLO (2 of 2)]
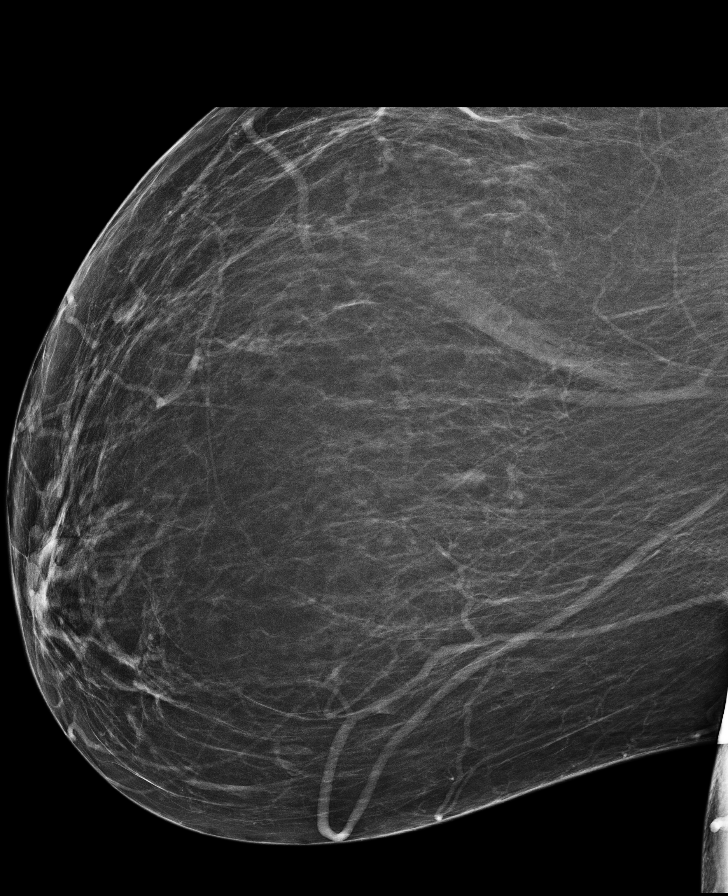

[L CC]
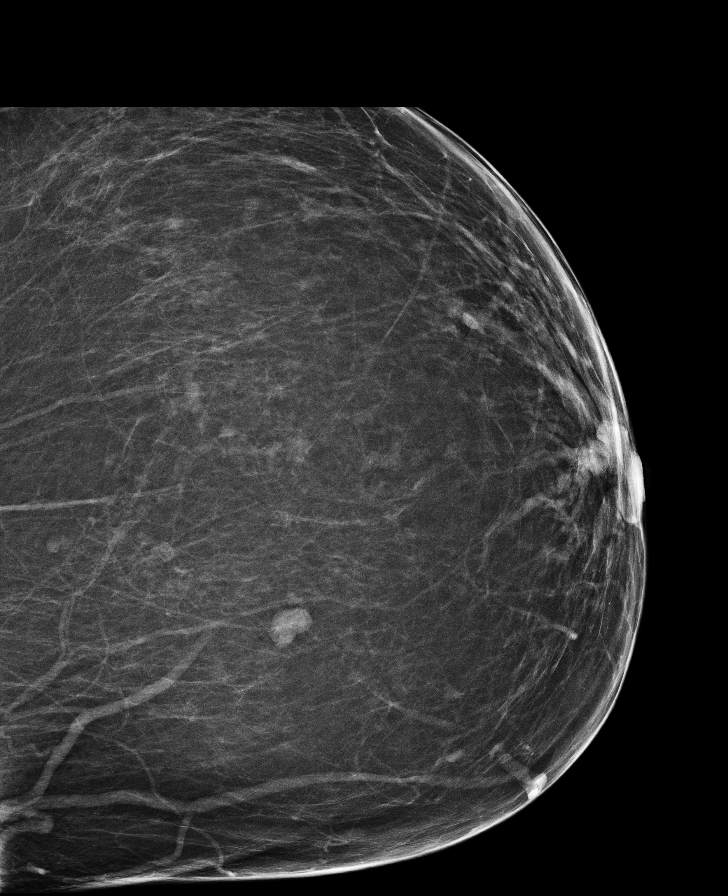

[L MLO (1 of 2)]
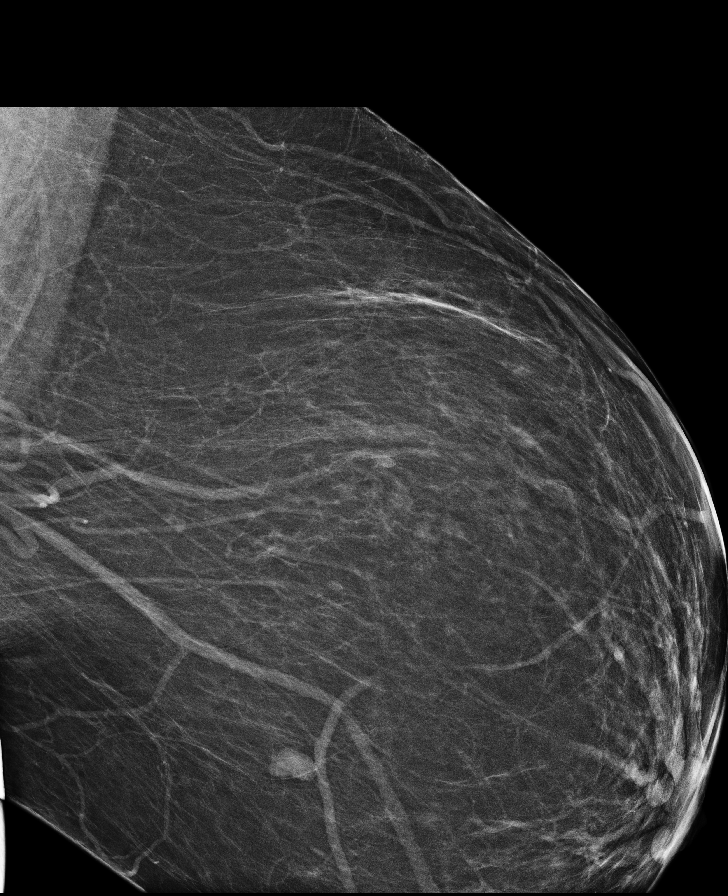

[R CC]
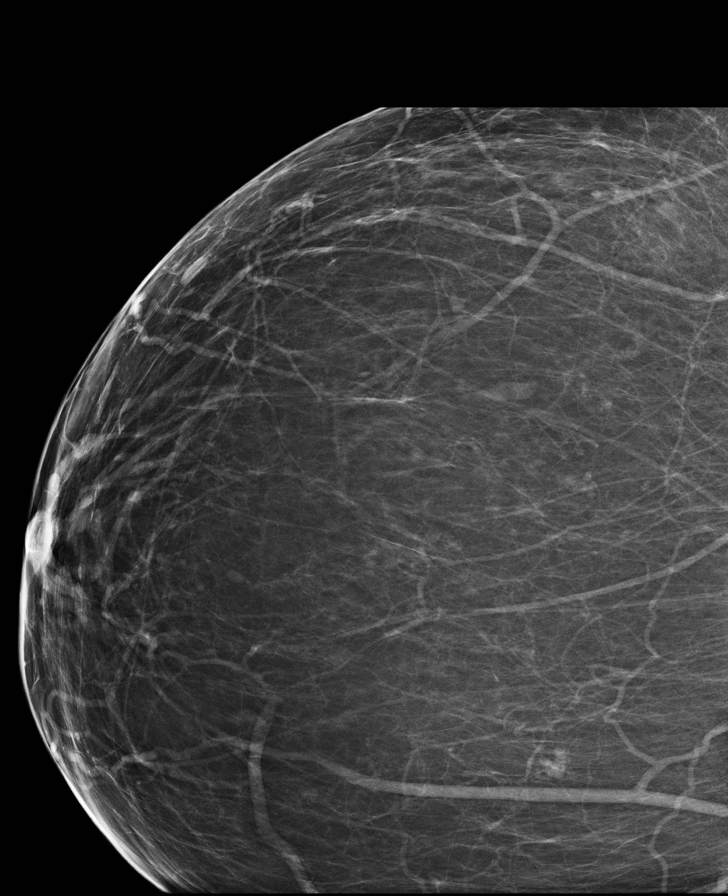

[R CV]
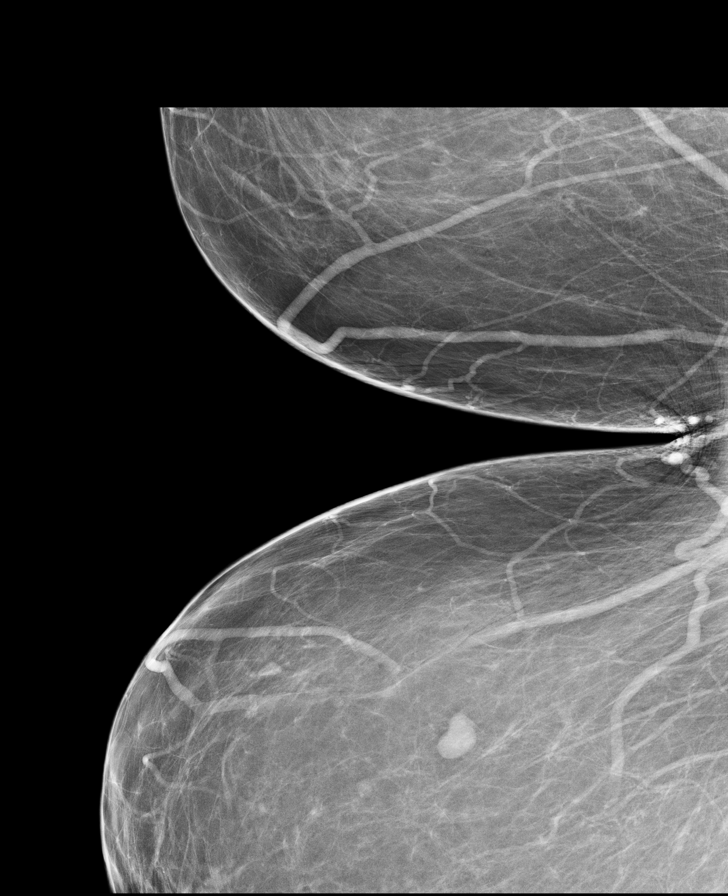

[L MLO (2 of 2)]
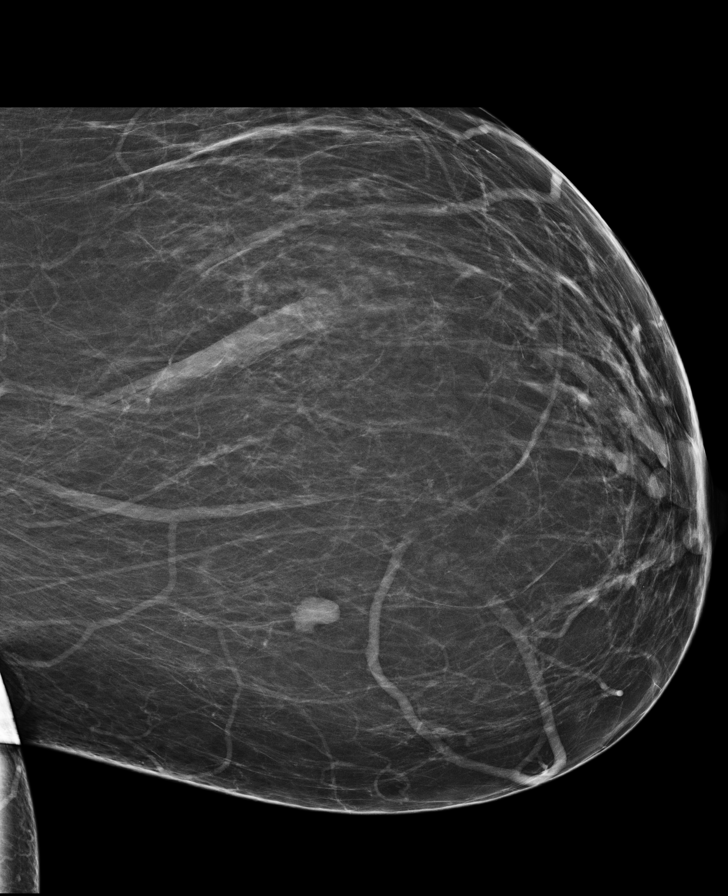

[7 of 7 positions shown; findings below may reference images not displayed]

ACR Breast Density Category b: There are scattered areas of
fibroglandular density.
FINDINGS: There are no findings suspicious for malignancy. Images were
processed with CAD.
IMPRESSION: No mammographic evidence of malignancy. A result letter of this
screening mammogram will be mailed directly to the patient.

RECOMMENDATION:
Screening mammogram in one year. (Code:AS-G-LCT)

BI-RADS CATEGORY  1: Negative.

## 2019-07-07 NOTE — Telephone Encounter (Signed)
Patient is requesting a 90 day supply

## 2019-07-07 NOTE — Addendum Note (Signed)
Addended by: Dorna Bloom on: 07/07/2019 10:28 AM   Modules accepted: Orders

## 2019-07-13 MED ORDER — OMEPRAZOLE 20 MG PO CPDR: DELAYED_RELEASE_CAPSULE | ORAL | 0 refills | Status: DC

## 2019-07-13 NOTE — Telephone Encounter (Signed)
Called [facility], verified she has already picked up the 30 day supply. Will sent in the 90 day supply when this supply is finished.   Patriciaann Clan, DO

## 2019-07-30 ENCOUNTER — Other Ambulatory Visit: Payer: Self-pay

## 2019-07-30 DIAGNOSIS — C541 Malignant neoplasm of endometrium: Secondary | ICD-10-CM

## 2019-07-31 MED ORDER — DIAZEPAM 5 MG PO TABS: ORAL_TABLET | ORAL | 0 refills | Status: DC

## 2019-07-31 NOTE — Telephone Encounter (Signed)
Patient calling again to check the status of medication.

## 2019-07-31 NOTE — Telephone Encounter (Signed)
Please schedule patient to follow up for anxiety in the clinic within the next month. Thank you!   Patriciaann Clan, DO

## 2019-08-26 ENCOUNTER — Other Ambulatory Visit: Payer: Self-pay

## 2019-08-26 DIAGNOSIS — C541 Malignant neoplasm of endometrium: Secondary | ICD-10-CM

## 2019-08-26 MED ORDER — OMEPRAZOLE 20 MG PO CPDR: DELAYED_RELEASE_CAPSULE | ORAL | 0 refills | Status: DC

## 2019-08-26 MED ORDER — DIAZEPAM 5 MG PO TABS: ORAL_TABLET | ORAL | 0 refills | Status: DC

## 2019-09-02 ENCOUNTER — Ambulatory Visit: Payer: Medicaid Other | Admitting: Family Medicine

## 2019-09-02 NOTE — Progress Notes (Deleted)
   Subjective:    Patient ID: Deborah [name], female    DOB: 11-Dec-1965, 53 y.o.   MRN: [id number]   CC: Meet new PCP  HPI: [name] is a 53 year old female with chronic combined HF, GERD, anxiety, history of endometrial cancer presenting to meet new PCP and discussed the following:  Medications:  Healthcare maintenance: Due for HIV, Tdap, flu, colonoscopy   Smoking status reviewed  Review of Systems Per HPI    Objective:  LMP 09/20/2012  Vitals and nursing note reviewed  General: NAD, pleasant Cardiac: RRR, normal heart sounds, no murmurs Respiratory: CTAB, normal effort Abdomen: soft, nontender, nondistended Extremities: no edema or cyanosis. WWP. Skin: warm and dry, no rashes noted Neuro: alert and oriented, no focal deficits Psych: normal affect  Assessment & Plan:    No problem-specific Assessment & Plan notes found for this encounter.    [place] [facility] Resident PGY-2

## 2019-10-02 ENCOUNTER — Emergency Department (HOSPITAL_COMMUNITY): Payer: Medicaid Other

## 2019-10-02 ENCOUNTER — Emergency Department (HOSPITAL_COMMUNITY)
Admission: EM | Admit: 2019-10-02 | Discharge: 2019-10-02 | Disposition: A | Discharge status: Home / Self Care | Payer: Medicaid Other | Attending: Emergency Medicine | Admitting: Emergency Medicine

## 2019-10-02 ENCOUNTER — Other Ambulatory Visit: Payer: Self-pay | Admitting: Family Medicine

## 2019-10-02 ENCOUNTER — Encounter (HOSPITAL_COMMUNITY): Payer: Self-pay

## 2019-10-02 ENCOUNTER — Other Ambulatory Visit: Payer: Self-pay

## 2019-10-02 DIAGNOSIS — W07XXXS Fall from chair, sequela: Secondary | ICD-10-CM | POA: Diagnosis not present

## 2019-10-02 DIAGNOSIS — M79671 Pain in right foot: Secondary | ICD-10-CM | POA: Insufficient documentation

## 2019-10-02 DIAGNOSIS — Y929 Unspecified place or not applicable: Secondary | ICD-10-CM | POA: Diagnosis not present

## 2019-10-02 DIAGNOSIS — S46912A Strain of unspecified muscle, fascia and tendon at shoulder and upper arm level, left arm, initial encounter: Secondary | ICD-10-CM | POA: Diagnosis not present

## 2019-10-02 DIAGNOSIS — Y939 Activity, unspecified: Secondary | ICD-10-CM | POA: Insufficient documentation

## 2019-10-02 DIAGNOSIS — I110 Hypertensive heart disease with heart failure: Secondary | ICD-10-CM | POA: Diagnosis not present

## 2019-10-02 DIAGNOSIS — Y999 Unspecified external cause status: Secondary | ICD-10-CM | POA: Diagnosis not present

## 2019-10-02 DIAGNOSIS — M25571 Pain in right ankle and joints of right foot: Secondary | ICD-10-CM | POA: Diagnosis not present

## 2019-10-02 DIAGNOSIS — M7989 Other specified soft tissue disorders: Secondary | ICD-10-CM | POA: Diagnosis not present

## 2019-10-02 DIAGNOSIS — C541 Malignant neoplasm of endometrium: Secondary | ICD-10-CM

## 2019-10-02 DIAGNOSIS — Z79899 Other long term (current) drug therapy: Secondary | ICD-10-CM | POA: Insufficient documentation

## 2019-10-02 DIAGNOSIS — I5042 Chronic combined systolic (congestive) and diastolic (congestive) heart failure: Secondary | ICD-10-CM | POA: Diagnosis not present

## 2019-10-02 DIAGNOSIS — R52 Pain, unspecified: Secondary | ICD-10-CM | POA: Diagnosis not present

## 2019-10-02 MED ORDER — ACETAMINOPHEN 325 MG PO TABS
650.0000 mg | ORAL_TABLET | Freq: Once | ORAL | Status: AC
  Administered 2019-10-02: 16:00:00 650 mg via ORAL
  Filled 2019-10-02: qty 2

## 2019-10-02 MED ORDER — DICLOFENAC SODIUM 1 % TD GEL: 4.0000 g | Freq: Four times a day (QID) | TRANSDERMAL | 1 refills | Status: DC

## 2019-10-02 MED ORDER — TIZANIDINE HCL 4 MG PO TABS: 4.0000 mg | ORAL_TABLET | Freq: Four times a day (QID) | ORAL | 0 refills | Status: DC | PRN

## 2019-10-02 MED ORDER — KETOROLAC TROMETHAMINE 30 MG/ML IJ SOLN
30.0000 mg | Freq: Once | INTRAMUSCULAR | Status: AC
  Administered 2019-10-02: 30 mg via INTRAVENOUS
  Filled 2019-10-02: qty 1

## 2019-10-02 NOTE — ED Notes (Signed)
Pt refused discharge vital signs

## 2019-10-02 NOTE — ED Provider Notes (Signed)
[facility] DEPT Provider Note   CSN: [id number] Arrival date & time: 10/02/19  1501     History   Chief Complaint Chief Complaint  Patient presents with  . Foot Pain  . Shoulder Pain    HPI EDDA Deborah [name] is a 53 y.o. female history of chronic lower extremity swelling, obesity, osteoarthritis     HPI  Patient presents for right foot pain since yesterday and left shoulder pain intermittently for years worse today.   Patient states she has right foot pain started yesterday in the ankle and now feels like it is most painful at the top of the foot.  States it is worse with walking, sharp, achy, worse with movement.  States she has had pain in her foot before but not this bad.  Has taken no meds for pain or inflammation PTA.  Patient also complains of left shoulder pain that she states she has had intermittently for years since she fell off a chair.  Has been evaluated in the past.  States that she has had rotator cuff injury but had no surgery.  States typically does not this painful.  Worse with movement feels she cannot lift her arm above her head.   Patient denies any recent trauma, loss of sensation, pain at rest.  Denies fevers, redness, warmth of joint area.  No history of diabetes, no history of gonorrhea or chlamydia denies any concern for STIs, no recent tick bites, no nausea or vomiting.  Past Medical History:  Diagnosis Date  . Anemia   . Anxiety   . Arthritis    ,Knees, back  . Congenital birth defect    Right hand  . Depression   . Endometrial cancer ([place])   . HLD (hyperlipidemia)    borderline  . Hx of echocardiogram    Echo (9/15): EF 50-55%, normal wall motion, grade 2 diastolic dysfunction, mild LAE  . Hx of radiation therapy 04/07/13- 05/14/13   pelvis 45 gray 25 fx  . Hypertension   . Obesity   . Swelling    ANKLES - TAKES LASIX    Patient Active Problem List   Diagnosis Date Noted  . Chronic combined systolic and  diastolic CHF, NYHA class 2 ([place]) 04/16/2019  . Educated about COVID-19 virus infection 04/16/2019  . Sleep apnea 04/16/2019  . Ventricular dysfunction of heart 09/19/2017  . Urinary frequency 05/09/2015  . Hx of cholecystectomy 02/19/2015  . Morbid obesity with BMI of 50.0-59.9, adult ([place]) 02/19/2015  . Hypomagnesemia 12/02/2013  . Headache 12/19/2012  . Precordial chest pain 12/19/2012  . Chest discomfort 10/16/2012  . Constipation due to pain medication 10/16/2012  . History of endometrial cancer 10/08/2012  . Wrist pain 08/28/2012  . 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 Blood in stool 08/28/2012  . Snoring 05/08/2012  . Hot flashes 07/18/2011  . Anxiety and depression 07/18/2011  . CARDIOVASCULAR FUNCTION STUDY, ABNORMAL 10/11/2009  . LOSS, SENSORINEURAL HEARING, UNILAT 07/08/2007  . BACK PAIN, LUMBAR 06/13/2007  . OBESITY, NOS 01/23/2007  . GASTROESOPHAGEAL REFLUX, NO ESOPHAGITIS 01/23/2007  . Primary osteoarthritis of both knees 01/23/2007    Past Surgical History:  Procedure Laterality Date  . ABDOMINAL HYSTERECTOMY  10/2011   complete  . CHOLECYSTECTOMY N/A 07/08/2015   Procedure: LAPAROSCOPIC CHOLECYSTECTOMY WITH INTRAOPERATIVE CHOLANGIOGRAM;  Surgeon: Judeth Horn, MD;  Location: [facility];  Service: General;  Laterality: N/A;  . HIATAL HERNIA REPAIR    . REPAIR VAGINAL CUFF  10/07/2012   Procedure: REPAIR VAGINAL CUFF;  Surgeon: [name], MD PHD;  Location: [facility];  Service: Gynecology;;  . ROBOTIC ASSISTED TOTAL HYSTERECTOMY WITH BILATERAL SALPINGO OOPHERECTOMY  10/07/2012   Procedure: ROBOTIC ASSISTED TOTAL HYSTERECTOMY WITH BILATERAL SALPINGO OOPHORECTOMY;  Surgeon: [name], MD PHD;  Location: [facility];  Service: Gynecology;  Laterality: Bilateral;     OB History    Gravida  2   Para  1   Term  0   Preterm  1   AB  1   Living  1     SAB  1   TAB  0   Ectopic  0   Multiple  0   Live Births               Home Medications    Prior to  Admission medications   Medication Sig Start Date End Date Taking? Authorizing Provider  acetaminophen (TYLENOL) 500 MG tablet Take 1,000 mg by mouth every 6 (six) hours as needed for moderate pain (pain).    [provider]  bifidobacterium infantis (ALIGN) capsule Take 1 capsule by mouth daily as needed (for regularity). Reported on 02/23/2016 10/03/12   Jerene Bears, MD  carvedilol (COREG) 3.125 MG tablet Take one (1) tablet (3.125 mg) by mouth each morning. Take two (2) tablets (6.25 mg) by mouth each evening. 04/16/19   Minus Breeding, MD  diazepam (VALIUM) 5 MG tablet TAKE 1 TABLET(5 MG) BY MOUTH EVERY 8 HOURS AS NEEDED FOR ANXIETY. FOLLOW UP FOR ADDITIONAL REFILLS 10/02/19   Patriciaann Clan, DO  diclofenac sodium (VOLTAREN) 1 % GEL Apply 4 g topically 4 (four) times daily. 10/02/19   Berley Gambrell, Kathleene Hazel, PA  fluticasone (FLONASE) 50 MCG/ACT nasal spray Place 2 sprays into both nostrils daily. 09/23/17   Smiley Houseman, MD  furosemide (LASIX) 20 MG tablet Take 1 tablet (20 mg total) by mouth daily as needed for fluid. 09/18/17   Smiley Houseman, MD  ibuprofen (ADVIL,MOTRIN) 800 MG tablet Take 1 tablet (800 mg total) by mouth every 8 (eight) hours as needed. 09/18/17   Smiley Houseman, MD  lactase (LACTAID) 3000 UNITS tablet Take 1 tablet by mouth 3 (three) times daily as needed (for dairy intake). Reported on 02/23/2016 10/03/12   Jerene Bears, MD  loperamide (IMODIUM) 2 MG capsule Take 2 mg by mouth 4 (four) times daily as needed for diarrhea or loose stools (diarrhea). Reported on 02/23/2016    [provider]  magnesium chloride (SLOW-MAG) 64 MG TBEC SR tablet Take 1-2 tablets by mouth 2 (two) times daily. Takes two in the morning and one in the evening 12/02/13   Leone Haven, MD  omeprazole (PRILOSEC) 20 MG capsule TAKE 1 CAPSULE(20 MG) BY MOUTH DAILY 08/26/19   Darrelyn Hillock N, DO  polyethylene glycol powder (GLYCOLAX/MIRALAX) powder Take 17 g by mouth daily  as needed. 11/10/13   Leone Haven, MD  potassium chloride (K-DUR,KLOR-CON) 10 MEQ tablet Take 2 tablets (20 mEq total) by mouth 2 (two) times daily. 09/18/17   Smiley Houseman, MD  sertraline (ZOLOFT) 100 MG tablet Take 1 tablet (100 mg total) by mouth every evening. 09/18/17   Smiley Houseman, MD  tiZANidine (ZANAFLEX) 4 MG tablet Take 1 tablet (4 mg total) by mouth every 6 (six) hours as needed for muscle spasms. 10/02/19   Tedd Sias, PA    Family History Family History  Problem Relation Age of Onset  . Diabetes Mother   . Pancreatic cancer  Maternal Grandfather        or liver cancer  . Hypertension Maternal Grandfather   . Cancer Maternal Grandfather        pancreatic  . Hypertension Maternal Grandmother   . Colon polyps Maternal Aunt   . Cancer Maternal Aunt        lung  . Colon cancer Cousin   . Cancer Cousin        lung  . Lung cancer Maternal Aunt   . Stroke Cousin   . Heart attack Neg Hx     Social History Social History   Tobacco Use  . Smoking status: Never Smoker  . Smokeless tobacco: Never Used  Substance Use Topics  . Alcohol use: No    Comment: rare  . Drug use: No     Allergies   Lactose intolerance (gi)   Review of Systems Review of Systems  Constitutional: Negative for fever.  Eyes: Negative for pain.  Respiratory: Negative for shortness of breath.   Cardiovascular: Positive for leg swelling (chronic bilateral non-edematous). Negative for chest pain.  Gastrointestinal: Negative for vomiting.  Genitourinary: Negative for dysuria.  Musculoskeletal: Positive for gait problem. Negative for myalgias.       Right foot pain, left shoulder pain  Skin: Negative for rash.  Neurological: Negative for dizziness and headaches.     Physical Exam Updated Vital Signs BP 121/73 (BP Location: Left Arm)   Pulse 97   Temp 97.9 F (36.6 C) (Oral)   Resp 18   LMP 09/20/2012   SpO2 98%   Physical Exam Vitals signs and nursing note  reviewed.  Constitutional:      General: She is not in acute distress.    Appearance: Normal appearance. She is not ill-appearing.  HENT:     Head: Normocephalic and atraumatic.     Mouth/Throat:     Mouth: Mucous membranes are moist.  Eyes:     General: No scleral icterus.       Right eye: No discharge.        Left eye: No discharge.     Conjunctiva/sclera: Conjunctivae normal.  Neck:     Musculoskeletal: Normal range of motion and neck supple.     Comments: No midline tenderness Cardiovascular:     Pulses: Normal pulses.  Pulmonary:     Effort: Pulmonary effort is normal.     Breath sounds: No stridor.  Musculoskeletal:     Comments: Tenderness to palpation over the right lateral dorsal midfoot.  No erythema, bruising, deformity  Patient has chronic, nonpitting edema bilateral lower extremities is symmetric  Skin:    General: Skin is warm and dry.     Capillary Refill: Capillary refill takes less than 2 seconds.  Neurological:     Mental Status: She is alert and oriented to person, place, and time. Mental status is at baseline.     Comments: She has normal sensation bilateral lower extremities and upper extremities.  Strength intact bilateral upper and lower extremities.  Decreased range of motion left shoulder secondary to pain.  Tenderness to palpation over left deltoid and trapezius.      ED Treatments / Results  Labs (all labs ordered are listed, but only abnormal results are displayed) Labs Reviewed - No data to display  EKG None  Radiology Dg Foot Complete Right  Result Date: 10/02/2019 CLINICAL DATA:  Lateral foot pain, dorsal and lateral pain and swelling EXAM: RIGHT FOOT COMPLETE - 3+ VIEW COMPARISON:  Ankle radiograph 08/17/2009  FINDINGS: Mild lateral soft tissue swelling. No acute osseous abnormality or suspicious osseous lesion. Corticated fragment adjacent the lateral malleolus is remote posttraumatic. No suspicious osseous lesions. Plantar calcaneal spur  is noted. Mild hallux valgus. Midfoot and hindfoot alignment is otherwise grossly preserved though incompletely assessed on nonweightbearing films. IMPRESSION: 1. Mild lateral soft tissue swelling. No acute osseous abnormality. 2. Corticated fragment adjacent the lateral malleolus is remote posttraumatic. Electronically Signed   By: Lovena Le M.D.   On: 10/02/2019 16:23    Procedures Procedures (including critical care time)  Medications Ordered in ED Medications  acetaminophen (TYLENOL) tablet 650 mg (650 mg Oral Given 10/02/19 1549)  ketorolac (TORADOL) 30 MG/ML injection 30 mg (30 mg Intravenous Given 10/02/19 1736)     Initial Impression / Assessment and Plan / ED Course  I have reviewed the triage vital signs and the nursing notes.  Pertinent labs & imaging results that were available during my care of the patient were reviewed by me and considered in my medical decision making (see chart for details).        Patient presents for right foot pain and left shoulder pain.  Patient has history of left shoulder pain in the past with flares intermittently.  Patient has reassuring physical exam although she is hesitant to move her shoulder, I am able to fully range her shoulder passively.  She is distally neurovascularly intact in her upper and lower extremities.  Doubt fracture, dislocation, infection or vascular injury.  Will have patient follow-up with orthopedics as there is potential for rotator cuff injury.  In the meantime will prescribe Zanaflex and Voltaren gel.  Patient given Toradol and Tylenol ED visit.  Patient also with right foot pain with mild patient's history is consistent with osteoarthritis pain.  No trauma, no signs of infection, no sign of DVT.  Doubt any acute injury.  Patient in postop shoe.  Will follow up with primary care.  Is within normal limits.  Patient discharged with good understanding of plan and agreeable to conservative management.  Patient states symptoms  are improved after Tylenol and Toradol.      The patient appears reasonably screened and/or stabilized for discharge and I doubt any other medical condition or other [facility] requiring further screening, evaluation, or treatment in the ED at this time prior to discharge.  Patient is hemodynamically stable, in NAD, and able to ambulate in the ED. Pain has been managed or a plan has been made for home management and has no complaints prior to discharge. Patient is comfortable with above plan and is stable for discharge at this time. All questions were answered prior to disposition. Results from the ER workup discussed with the patient face to face and all questions answered to the best of my ability. The patient is safe for discharge with strict return precautions. Patient appears safe for discharge with appropriate follow-up.  Conveyed my impression with the patient and he voiced understanding and is agreeable to plan.   An After Visit Summary was printed and given to the patient.  Portions of this note were generated with [employer]. Dictation errors may occur despite best attempts at proofreading.      Final Clinical Impressions(s) / ED Diagnoses   Final diagnoses:  Foot pain, right  Strain of left shoulder, initial encounter    ED Discharge Orders         Ordered    tiZANidine (ZANAFLEX) 4 MG tablet  Every 6 hours PRN  10/02/19 1659    diclofenac sodium (VOLTAREN) 1 % GEL  4 times daily     10/02/19 1702           Pati Gallo [place], [place] 10/03/19 Margaretha Glassing    Deborah Rank, MD 10/04/19 [phone]

## 2019-10-02 NOTE — Discharge Instructions (Addendum)
Please follow up with orthopedics (see attached) if you continue to have issues with your left shoulder. Please use tylenol 500mg  every 6 hours for the next 4 days for pain. You were given a prescription for tizanidine which is a muscle relaxer.  You should not drive, work, consume alcohol, or operate machinery while taking this medication as it can make you very drowsy.   Please apply the gel to your foot where the pain is. The muscle relaxer is for your shoulder.  PLEASE CONTINUE TO MOVE YOUR ARM TO PREVENT THE JOINT FROM BECOMING STUCK IN PLACE.

## 2019-10-02 NOTE — Telephone Encounter (Signed)
Please call pt to reschedule an appt to meet her new primary care provider and discuss her anxiety for continued management of her valium.   Patriciaann Clan, DO

## 2019-10-02 NOTE — ED Triage Notes (Addendum)
Pt BIB EMS from home. Pt reports top right foot pain x2 days. Pain increases when standing on it. Pts foot is swollen on top and right side of foot. Pt denies injury. Pt also reports left shoulder pain.  HR 95 97% RA Temp 97.9

## 2019-10-05 NOTE — Telephone Encounter (Signed)
LMOVM informing pt to call back and schedule an appt. Deborah [name] Holter, CMA

## 2019-10-12 ENCOUNTER — Other Ambulatory Visit: Payer: Self-pay | Admitting: Family Medicine

## 2019-10-12 DIAGNOSIS — C541 Malignant neoplasm of endometrium: Secondary | ICD-10-CM

## 2019-10-12 NOTE — Telephone Encounter (Signed)
Please call patient to have her schedule an appointment to meet her new PCP and discuss further valium refills. I will send in a 1 month supply. She had previously made an appointment after requesting her to be seen months ago, however canceled.   Thank you!  Patriciaann Clan, DO

## 2019-10-13 NOTE — Telephone Encounter (Signed)
LMOVM for pt to call us back and make an appt to meet new pcp and discuss new refills. Nalini Alcaraz Kennon Holter, CMA

## 2019-10-16 ENCOUNTER — Other Ambulatory Visit: Payer: Self-pay | Admitting: Family Medicine

## 2019-10-16 DIAGNOSIS — C541 Malignant neoplasm of endometrium: Secondary | ICD-10-CM

## 2019-10-21 ENCOUNTER — Ambulatory Visit: Payer: Medicaid Other | Admitting: Orthopedic Surgery

## 2019-10-27 ENCOUNTER — Telehealth: Payer: Self-pay | Admitting: Family Medicine

## 2019-10-27 NOTE — Telephone Encounter (Signed)
Called patient and left voicemail to call back the clinic.  Would like to have her schedule an appointment at her convenience to meet her new PCP/discuss her medications and health maintenance.   Patriciaann Clan, DO

## 2019-11-02 ENCOUNTER — Other Ambulatory Visit: Payer: Self-pay

## 2019-11-02 DIAGNOSIS — Z20822 Contact with and (suspected) exposure to covid-19: Secondary | ICD-10-CM

## 2019-11-05 LAB — NOVEL CORONAVIRUS, NAA: SARS-CoV-2, NAA: NOT DETECTED

## 2019-12-09 ENCOUNTER — Other Ambulatory Visit: Payer: Self-pay

## 2019-12-10 MED ORDER — DIAZEPAM 5 MG PO TABS: 5.0000 mg | ORAL_TABLET | Freq: Two times a day (BID) | ORAL | 0 refills | Status: DC | PRN

## 2019-12-17 ENCOUNTER — Telehealth: Payer: Self-pay | Admitting: *Deleted

## 2019-12-17 NOTE — Telephone Encounter (Signed)
Patient called and scheduled her follow up

## 2019-12-22 ENCOUNTER — Ambulatory Visit: Payer: Medicaid Other | Admitting: Family Medicine

## 2019-12-29 ENCOUNTER — Ambulatory Visit: Payer: Medicaid Other | Admitting: Family Medicine

## 2019-12-31 ENCOUNTER — Telehealth: Payer: Self-pay

## 2019-12-31 NOTE — Telephone Encounter (Signed)
LVM for pt to call our office. If pt calls, please ask COVID screening questions. Lurene Robley T Janeice Stegall, CMA  

## 2020-01-01 ENCOUNTER — Telehealth (INDEPENDENT_AMBULATORY_CARE_PROVIDER_SITE_OTHER): Payer: Medicaid Other | Admitting: Family Medicine

## 2020-01-01 ENCOUNTER — Telehealth: Payer: Self-pay

## 2020-01-01 ENCOUNTER — Other Ambulatory Visit: Payer: Self-pay

## 2020-01-01 DIAGNOSIS — Z1211 Encounter for screening for malignant neoplasm of colon: Secondary | ICD-10-CM | POA: Diagnosis not present

## 2020-01-01 DIAGNOSIS — F329 Major depressive disorder, single episode, unspecified: Secondary | ICD-10-CM

## 2020-01-01 DIAGNOSIS — F419 Anxiety disorder, unspecified: Secondary | ICD-10-CM | POA: Diagnosis not present

## 2020-01-01 DIAGNOSIS — Z0000 Encounter for general adult medical examination without abnormal findings: Secondary | ICD-10-CM

## 2020-01-01 DIAGNOSIS — F32A Depression, unspecified: Secondary | ICD-10-CM

## 2020-01-01 MED ORDER — SERTRALINE HCL 50 MG PO TABS: 50.0000 mg | ORAL_TABLET | Freq: Every day | ORAL | 1 refills | Status: DC

## 2020-01-01 NOTE — Telephone Encounter (Signed)
Pt calls nurse line regarding appointment this afternoon. Per patient she has been experiencing slight cold symptoms. Advised patient that we should reschedule her appointment as virtual visit. Pt agreeable and appointment changed to virtual visit this afternoon.   To PCP  Talbot Grumbling, RN

## 2020-01-01 NOTE — Progress Notes (Signed)
[facility] Telemedicine Visit  Patient consented to have virtual visit. Method of visit: Telephone  Encounter participants: Patient: Deborah [name] - located at Home  Provider: Patriciaann Clan - located at [facility] Others (if applicable): None   Chief Complaint: Follow-up/meet new PCP   HPI: Deborah [name] is a 54 year old female presenting via telephone discussed the following:  Initially scheduled as an in office visit, however through Covid questionnaire with triage nurse noted she had had some nasal congestion for which she has normally during this time of year, thus rescheduled as virtual.  Anxiety/depression: Chronic, has been present since endometrial cancer diagnosis in 2012/13 for which she underwent a total hysterectomy and chemoradiation, cancer survivor and follows up with oncology periodically.  She endorses she continues to feel anxious and restless frequently due to the current pandemic, also reports she lost her aunt 1 week ago.  Has good family support.  She has been maintained only on Valium 5 mg once daily as needed for the past several years.  Not currently in therapy.  Previously tried Zoloft in 2018, tolerated medication well but self discontinued due to not wanting to 'double dip.'  Feels her Valium works well to control her symptoms.  No SI or HI.  ROS: per HPI  Pertinent PMHx:  Combined CHF, GERD, unilateral sensorineural hearing, and endometrial cancer s/p chemoradiation/hysterectomy.   Exam:  Respiratory: Unlabored breathing, speaking in full sentences  Psych: Slightly anxious, circumstantial speech  Assessment/Plan:  Anxiety and depression Longstanding, has been on Valium 5 mg daily for the past several years to mitigate symptoms. Had lengthy discussion that continuing Valium as solo therapy for her anxiety would be poor medical care and discuss the necessity of controlling her anxiety through alternative methods. She is willing to start  Zoloft concurrently, will start with 50 mg and can increase up to 100 mg if tolerating well. Additionally discussed starting therapy, she would like to further address this on follow-up.  If improvement with Zoloft in the next few months, will work towards tapering Valium down slowly (patient is dependent, chronically on for several years).  Follow-up in 1 month to assess progress.  Healthcare maintenance Place referral for screening colonoscopy.  Overdue for Tdap and flu shot, HIV screening.  Will discuss on follow-up.    Follow-up office visit in 1 month or sooner if needed.  Time spent during visit with patient: 25 minutes  Patriciaann Clan, DO

## 2020-01-02 ENCOUNTER — Encounter: Payer: Self-pay | Admitting: Family Medicine

## 2020-01-02 DIAGNOSIS — Z0000 Encounter for general adult medical examination without abnormal findings: Secondary | ICD-10-CM | POA: Insufficient documentation

## 2020-01-02 NOTE — Assessment & Plan Note (Addendum)
Longstanding, has been on Valium 5 mg daily for the past several years to mitigate symptoms. Had lengthy discussion that continuing Valium as solo therapy for her anxiety would be poor medical care and discuss the necessity of controlling her anxiety through alternative methods. She is willing to start Zoloft concurrently, will start with 50 mg and can increase up to 100 mg if tolerating well. Additionally discussed starting therapy, she would like to further address this on follow-up.  If improvement with Zoloft in the next few months, will work towards tapering Valium down slowly (patient is dependent, chronically on for several years).  Follow-up in 1 month to assess progress.

## 2020-01-02 NOTE — Assessment & Plan Note (Signed)
Place referral for screening colonoscopy.  Overdue for Tdap and flu shot, HIV screening.  Will discuss on follow-up.

## 2020-01-05 ENCOUNTER — Other Ambulatory Visit: Payer: Self-pay | Admitting: *Deleted

## 2020-01-05 MED ORDER — DIAZEPAM 5 MG PO TABS: 5.0000 mg | ORAL_TABLET | Freq: Two times a day (BID) | ORAL | 1 refills | Status: DC | PRN

## 2020-01-08 ENCOUNTER — Telehealth: Payer: Self-pay | Admitting: *Deleted

## 2020-01-08 NOTE — Telephone Encounter (Signed)
Patient called back and rescheduled her appt

## 2020-01-08 NOTE — Telephone Encounter (Signed)
Called and left a message to the patient to call the office back. Need to move appt from 2/17 to 3/3

## 2020-01-13 ENCOUNTER — Ambulatory Visit: Payer: Medicaid Other | Admitting: Obstetrics & Gynecology

## 2020-01-26 ENCOUNTER — Telehealth: Payer: Self-pay

## 2020-01-26 NOTE — Telephone Encounter (Signed)
LM for Deborah [name] to call back to the office to r/s her appointment. 01-27-20 appointment canceled as pt requested.

## 2020-01-26 NOTE — Telephone Encounter (Signed)
Pt lm stating that she was not feeling well and wants to cancel her appointment tomorrow 01-27-20 with Dr. Lahoma Crocker.

## 2020-01-27 ENCOUNTER — Other Ambulatory Visit: Payer: Self-pay

## 2020-01-27 ENCOUNTER — Other Ambulatory Visit: Payer: Self-pay | Admitting: Family Medicine

## 2020-01-27 ENCOUNTER — Ambulatory Visit: Payer: Medicaid Other | Admitting: Obstetrics & Gynecology

## 2020-01-27 DIAGNOSIS — Z1231 Encounter for screening mammogram for malignant neoplasm of breast: Secondary | ICD-10-CM

## 2020-02-08 ENCOUNTER — Other Ambulatory Visit: Payer: Self-pay

## 2020-02-09 MED ORDER — DIAZEPAM 5 MG PO TABS: 5.0000 mg | ORAL_TABLET | Freq: Two times a day (BID) | ORAL | 0 refills | Status: DC | PRN

## 2020-02-25 ENCOUNTER — Ambulatory Visit: Payer: Medicaid Other

## 2020-03-01 ENCOUNTER — Telehealth: Payer: Self-pay | Admitting: *Deleted

## 2020-03-01 NOTE — Telephone Encounter (Signed)
Patient called and canceled her appt for tomorrow, patient is sick. She will call back to reschedule

## 2020-03-01 NOTE — Progress Notes (Deleted)
Follow Up Note: Gyn-Onc  Deborah [name] 54 y.o. female  CC: No chief complaint on file.   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   History of endometrial cancer  10/07/2012 Cancer Staging   Cancer Staging History of endometrial cancer Staging form: Corpus Uteri - Carcinoma, AJCC 7th Edition - Clinical: Stage Unknown (T1a, NX, M0) - Unsigned - Pathologic: Stage Unknown (T1a, NX, M0, Free text: IIIC) - Unsigned    10/08/2012 Initial Diagnosis   Endometrial cancer   11/25/2012 Imaging   CT: Shotty bilateral external iliac lymphadenopathy measuring up to 1.3 cm, suspicious for metastatic disease. Nonspecific less than 5 mm retroperitoneal lymph nodes also noted in the left para-aortic region. 2. 2.7 cm postop lymphocele versus low attenuation lymphadenopathy in the proximal left external iliac chain. 3. Cholelithiasis and right nephrolithiasis incidentally noted.   04/07/2013 - 05/25/2013 Radiation Therapy   XRT   08/25/2013 Imaging   CT: no evidence of abdominal lymphadenopathy. There Is no evidence of inflammatory process abscess or ascites the adnexal regions are unremarkable in appearance there is no new or progressive disease within the abdomen or pelvis    - 07/24/2013 Chemotherapy   The patient had 6 cycles of Taxol/Carboplatin and pelvic XRT sandwich fashion     Interval History: She denies any vaginal bleeding, abdominal/pelvic pain, cough, lethargy or increasing abdominal girth.  Review of Systems {ros-complete:30496}  Current Meds:  Outpatient Encounter Medications as of 03/02/2020  Medication Sig  . acetaminophen (TYLENOL) 500 MG tablet Take 1,000 mg by mouth every 6  (six) hours as needed for moderate pain (pain).  . bifidobacterium infantis (ALIGN) capsule Take 1 capsule by mouth daily as needed (for regularity). Reported on 02/23/2016  . carvedilol (COREG) 3.125 MG tablet Take one (1) tablet (3.125 mg) by mouth each morning. Take two (2) tablets (6.25 mg) by mouth each evening.  . diazepam (VALIUM) 5 MG tablet Take 1 tablet (5 mg total) by mouth every 12 (twelve) hours as needed for anxiety.  . diclofenac sodium (VOLTAREN) 1 % GEL Apply 4 g topically 4 (four) times daily.  . fluticasone (FLONASE) 50 MCG/ACT nasal spray Place 2 sprays into both nostrils daily.  . furosemide (LASIX) 20 MG tablet Take 1 tablet (20 mg total) by mouth daily as needed for fluid.  [name] ibuprofen (ADVIL,MOTRIN) 800 MG tablet Take 1 tablet (800 mg total) by mouth every 8 (eight) hours as needed.  . lactase (LACTAID) 3000 UNITS tablet Take 1 tablet by mouth 3 (three) times daily as needed (for dairy intake). Reported on 02/23/2016  . loperamide (IMODIUM) 2 MG capsule Take 2 mg by mouth 4 (four) times daily as needed for diarrhea or loose stools (diarrhea). Reported on 02/23/2016  . magnesium chloride (SLOW-MAG) 64 MG TBEC SR tablet Take 1-2 tablets by mouth 2 (two) times daily. Takes two in the morning and one in the evening  . omeprazole (PRILOSEC) 20 MG capsule TAKE 1 CAPSULE(20 MG) BY MOUTH DAILY  . polyethylene glycol powder (GLYCOLAX/MIRALAX) powder Take 17 g by mouth daily as needed.  . potassium chloride (K-DUR,KLOR-CON) 10 MEQ tablet Take 2 tablets (20 mEq total) by mouth 2 (two) times daily.  . sertraline (ZOLOFT) 100 MG tablet Take 1 tablet (100 mg total) by mouth every evening.  [name]  sertraline (ZOLOFT) 50 MG tablet Take 1 tablet (50 mg total) by mouth daily. Start with 1/2 tablet for a few days.  [name] tiZANidine (ZANAFLEX) 4 MG tablet Take 1 tablet (4 mg total) by mouth every 6 (six) hours as needed for muscle spasms.   No facility-administered encounter medications on file as of  03/02/2020.    Allergy:  Allergies  Allergen Reactions  . Lactose Intolerance (Gi) Diarrhea    Social Hx:   Social History   Socioeconomic History  . Marital status: Single    Spouse name: Not on file  . Number of children: 1  . Years of education: Not on file  . Highest education level: Not on file  Occupational History  . Occupation: Homemaker  Tobacco Use  . Smoking status: Never Smoker  . Smokeless tobacco: Never Used  Substance and Sexual Activity  . Alcohol use: No    Comment: rare  . Drug use: No  . Sexual activity: Yes    Birth control/protection: Condom  Other Topics Concern  . Not on file  Social History Narrative  . Not on file   Social Determinants of Health   Financial Resource Strain:   . Difficulty of Paying Living Expenses:   Food Insecurity:   . Worried About Charity fundraiser in the Last Year:   . Arboriculturist in the Last Year:   Transportation Needs:   . Film/video editor (Medical):   [name] Lack of Transportation (Non-Medical):   Physical Activity:   . Days of Exercise per Week:   . Minutes of Exercise per Session:   Stress:   . Feeling of Stress :   Social Connections:   . Frequency of Communication with Friends and Family:   . Frequency of Social Gatherings with Friends and Family:   . Attends Religious Services:   . Active Member of Clubs or Organizations:   . Attends Archivist Meetings:   [name] Marital Status:   Intimate Partner Violence:   . Fear of Current or Ex-Partner:   . Emotionally Abused:   [name] Physically Abused:   . Sexually Abused:     Past Surgical Hx:  Past Surgical History:  Procedure Laterality Date  . ABDOMINAL HYSTERECTOMY  10/2011   complete  . CHOLECYSTECTOMY N/A 07/08/2015   Procedure: LAPAROSCOPIC CHOLECYSTECTOMY WITH INTRAOPERATIVE CHOLANGIOGRAM;  Surgeon: Judeth Horn, MD;  Location: [place];  Service: General;  Laterality: N/A;  . HIATAL HERNIA REPAIR    . REPAIR VAGINAL CUFF  10/07/2012    Procedure: REPAIR VAGINAL CUFF;  Surgeon: [name], MD PHD;  Location: [facility];  Service: Gynecology;;  . ROBOTIC ASSISTED TOTAL HYSTERECTOMY WITH BILATERAL SALPINGO OOPHERECTOMY  10/07/2012   Procedure: ROBOTIC ASSISTED TOTAL HYSTERECTOMY WITH BILATERAL SALPINGO OOPHORECTOMY;  Surgeon: [name], MD PHD;  Location: [facility];  Service: Gynecology;  Laterality: Bilateral;    Past Medical Hx:  Past Medical History:  Diagnosis Date  . Anemia   . Anxiety   . Arthritis    ,Knees, back  . Blood in stool 08/28/2012  . Congenital birth defect    Right hand  . Depression   . Educated about COVID-19 virus infection 04/16/2019  . Endometrial cancer ([place])   . HLD (hyperlipidemia)    borderline  . Hx of echocardiogram    Echo (9/15): EF 50-55%, normal wall motion, grade 2 diastolic dysfunction, mild LAE  . Hx of radiation therapy 04/07/13- 05/14/13   pelvis 45 gray 25 fx  .  Hypertension   . Obesity   . Swelling    ANKLES - TAKES LASIX  . Wrist pain 08/28/2012    Family Hx:  Family History  Problem Relation Age of Onset  . Diabetes Mother   . Pancreatic cancer Maternal Grandfather        or liver cancer  . Hypertension Maternal Grandfather   . Cancer Maternal Grandfather        pancreatic  . Hypertension Maternal Grandmother   . Colon polyps Maternal Aunt   . Cancer Maternal Aunt        lung  . Colon cancer Cousin   . Cancer Cousin        lung  . Lung cancer Maternal Aunt   . Stroke Cousin   . Heart attack Neg Hx     Vitals:  Last menstrual period 09/20/2012.  Physical Exam: *** {Physical NR:[id number]  Assessment/Plan: Lahoma Crocker, MD 03/01/2020, 3:08 PM

## 2020-03-01 NOTE — Assessment & Plan Note (Deleted)
Negative symptom review.  Exam not suggestive of recurrence.   Recommend annual f/u.  Follow-up with a generalist appropriate.

## 2020-03-02 ENCOUNTER — Ambulatory Visit: Payer: Medicaid Other | Admitting: Obstetrics & Gynecology

## 2020-03-24 ENCOUNTER — Encounter: Payer: Self-pay | Admitting: Family Medicine

## 2020-04-12 ENCOUNTER — Telehealth: Payer: Self-pay | Admitting: *Deleted

## 2020-04-12 NOTE — Telephone Encounter (Signed)
A message was left, re: her follow up visit. 

## 2020-04-21 ENCOUNTER — Encounter: Payer: Self-pay | Admitting: Family Medicine

## 2020-04-22 ENCOUNTER — Ambulatory Visit: Payer: Medicaid Other

## 2020-04-28 ENCOUNTER — Ambulatory Visit: Payer: Medicaid Other

## 2020-04-28 ENCOUNTER — Other Ambulatory Visit: Payer: Self-pay

## 2020-04-28 ENCOUNTER — Other Ambulatory Visit: Payer: Self-pay | Admitting: Cardiology

## 2020-04-28 MED ORDER — CARVEDILOL 3.125 MG PO TABS: ORAL_TABLET | ORAL | 0 refills | Status: DC

## 2020-04-28 MED ORDER — DIAZEPAM 5 MG PO TABS: 5.0000 mg | ORAL_TABLET | Freq: Two times a day (BID) | ORAL | 0 refills | Status: DC | PRN

## 2020-04-28 NOTE — Telephone Encounter (Signed)
Please call patient to schedule anxiety follow up for further refills. Thank you!   Patriciaann Clan, DO

## 2020-04-28 NOTE — Telephone Encounter (Signed)
*  STAT* If patient is at the pharmacy, call can be transferred to refill team.   1. Which medications need to be refilled? (please list name of each medication and dose if known)  carvedilol (COREG) 3.125 MG tablet  2. Which pharmacy/location (including street and city if local pharmacy) is medication to be sent to? [facility] [phone] - [place], [place] - [place]  3. Do they need a 30 day or 90 day supply? 90 day supply  Patient is completely out of medication.

## 2020-05-02 ENCOUNTER — Telehealth: Payer: Self-pay

## 2020-05-02 ENCOUNTER — Ambulatory Visit: Payer: Self-pay | Admitting: *Deleted

## 2020-05-02 NOTE — Telephone Encounter (Signed)
Patient calls nurse line regarding potential food poisoning. Patient reports nausea, diarrhea and abdominal cramping. Patient reports onset Saturday evening about 30-45 minutes after eating food. Patient denies vomiting, fever, or blood in stool.   Today, patient reports some continued abdominal soreness. Patient states normal BM this morning. Encouraged supportive measures for patient, increasing PO fluids and gradually advancing diet as tolerated.   ED/return precautions given to patient.   Talbot Grumbling, RN

## 2020-05-02 NOTE — Telephone Encounter (Signed)
Pt. Reports she had food from a cook out and began having cramping and diarrhea . Diarrhea stopped and cramping "is better." No fever of blood in stool. States she called her PCP and was told to continue to hydrate and have a bland diet. Will call PCP as needed.  Reason for Disposition . [1] MILD-MODERATE pain AND [2] constant and [3] present < 2 hours  Answer Assessment - Initial Assessment Questions 1. LOCATION: "Where does it hurt?"      Hurts over belly button 2. RADIATION: "Does the pain shoot anywhere else?" (e.g., chest, back)     No 3. ONSET: "When did the pain begin?" (e.g., minutes, hours or days ago)      Saturday 4. SUDDEN: "Gradual or sudden onset?"     Sudden 5. PATTERN "Does the pain come and go, or is it constant?"    - If constant: "Is it getting better, staying the same, or worsening?"      (Note: Constant means the pain never goes away completely; most serious pain is constant and it progresses)     - If intermittent: "How long does it last?" "Do you have pain now?"     (Note: Intermittent means the pain goes away completely between bouts)     Comes and goes 6. SEVERITY: "How bad is the pain?"  (e.g., Scale 1-10; mild, moderate, or severe)   - MILD (1-3): doesn't interfere with normal activities, abdomen soft and not tender to touch    - MODERATE (4-7): interferes with normal activities or awakens from sleep, tender to touch    - SEVERE (8-10): excruciating pain, doubled over, unable to do any normal activities      10 7. RECURRENT SYMPTOM: "Have you ever had this type of abdominal pain before?" If so, ask: "When was the last time?" and "What happened that time?"      No 8. CAUSE: "What do you think is causing the abdominal pain?"     Food 9. RELIEVING/AGGRAVATING FACTORS: "What makes it better or worse?" (e.g., movement, antacids, bowel movement)     No 10. OTHER SYMPTOMS: "Has there been any vomiting, diarrhea, constipation, or urine problems?"       Some  nausea 11. PREGNANCY: "Is there any chance you are pregnant?" "When was your last menstrual period?"       No  Protocols used: ABDOMINAL PAIN - [facility]

## 2020-05-04 ENCOUNTER — Ambulatory Visit: Payer: Medicaid Other | Admitting: Cardiology

## 2020-05-06 ENCOUNTER — Ambulatory Visit
Admission: RE | Admit: 2020-05-06 | Discharge: 2020-05-06 | Disposition: A | Discharge status: Home / Self Care | Payer: Medicaid Other | Source: Ambulatory Visit | Attending: Family Medicine | Admitting: Family Medicine

## 2020-05-06 ENCOUNTER — Other Ambulatory Visit: Payer: Self-pay

## 2020-05-06 DIAGNOSIS — Z1231 Encounter for screening mammogram for malignant neoplasm of breast: Secondary | ICD-10-CM | POA: Diagnosis not present

## 2020-05-06 DIAGNOSIS — Z7689 Persons encountering health services in other specified circumstances: Secondary | ICD-10-CM | POA: Diagnosis not present

## 2020-05-10 NOTE — Progress Notes (Deleted)
Follow Up Note: Gyn-Onc  Deborah [name] 54 y.o. 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   History of endometrial cancer  10/07/2012 Cancer Staging   Cancer Staging History of endometrial cancer Staging form: Corpus Uteri - Carcinoma, AJCC 7th Edition - Clinical: Stage Unknown (T1a, NX, M0) - Unsigned - Pathologic: Stage Unknown (T1a, NX, M0, Free text: IIIC) - Unsigned    10/08/2012 Initial Diagnosis   Endometrial cancer   11/25/2012 Imaging   CT: Shotty bilateral external iliac lymphadenopathy measuring up to 1.3 cm, suspicious for metastatic disease. Nonspecific less than 5 mm retroperitoneal lymph nodes also noted in the left para-aortic region. 2. 2.7 cm postop lymphocele versus low attenuation lymphadenopathy in the proximal left external iliac chain. 3. Cholelithiasis and right nephrolithiasis incidentally noted.   04/07/2013 - 05/25/2013 Radiation Therapy   XRT   08/25/2013 Imaging   CT: no evidence of abdominal lymphadenopathy. There Is no evidence of inflammatory process abscess or ascites the adnexal regions are unremarkable in appearance there is no new or progressive disease within the abdomen or pelvis    - 07/24/2013 Chemotherapy   The patient had 6 cycles of Taxol/Carboplatin and pelvic XRT sandwich fashion      Interval History: She denies any vaginal bleeding, abdominal/pelvic pain, cough, lethargy or abdominal distention.    Review of Systems {ros-complete:30496}  Current Meds:  Outpatient Encounter Medications as of 05/11/2020  Medication Sig  . acetaminophen (TYLENOL) 500 MG tablet Take 1,000 mg by mouth every 6 (six) hours as needed for moderate pain (pain).  .  bifidobacterium infantis (ALIGN) capsule Take 1 capsule by mouth daily as needed (for regularity). Reported on 02/23/2016  . carvedilol (COREG) 3.125 MG tablet Take one (1) tablet (3.125 mg) by mouth each morning. Take two (2) tablets (6.25 mg) by mouth each evening.  . diazepam (VALIUM) 5 MG tablet Take 1 tablet (5 mg total) by mouth every 12 (twelve) hours as needed for anxiety.  . diclofenac sodium (VOLTAREN) 1 % GEL Apply 4 g topically 4 (four) times daily.  . fluticasone (FLONASE) 50 MCG/ACT nasal spray Place 2 sprays into both nostrils daily.  . furosemide (LASIX) 20 MG tablet Take 1 tablet (20 mg total) by mouth daily as needed for fluid.  [name] ibuprofen (ADVIL,MOTRIN) 800 MG tablet Take 1 tablet (800 mg total) by mouth every 8 (eight) hours as needed.  . lactase (LACTAID) 3000 UNITS tablet Take 1 tablet by mouth 3 (three) times daily as needed (for dairy intake). Reported on 02/23/2016  . loperamide (IMODIUM) 2 MG capsule Take 2 mg by mouth 4 (four) times daily as needed for diarrhea or loose stools (diarrhea). Reported on 02/23/2016  . magnesium chloride (SLOW-MAG) 64 MG TBEC SR tablet Take 1-2 tablets by mouth 2 (two) times daily. Takes two in the morning and one in the evening  . omeprazole (PRILOSEC) 20 MG capsule TAKE 1 CAPSULE(20 MG) BY MOUTH DAILY  . polyethylene glycol powder (GLYCOLAX/MIRALAX) powder Take 17 g by mouth daily as needed.  . potassium chloride (K-DUR,KLOR-CON) 10 MEQ tablet Take 2 tablets (20 mEq total) by mouth 2 (two) times daily.  . sertraline (ZOLOFT) 100 MG tablet Take 1 tablet (100 mg total) by mouth every evening.  . sertraline (ZOLOFT) 50 MG tablet Take 1 tablet (50  mg total) by mouth daily. Start with 1/2 tablet for a few days.  [name] tiZANidine (ZANAFLEX) 4 MG tablet Take 1 tablet (4 mg total) by mouth every 6 (six) hours as needed for muscle spasms.   No facility-administered encounter medications on file as of 05/11/2020.    Allergy:  Allergies  Allergen  Reactions  . Lactose Intolerance (Gi) Diarrhea    Social Hx:   Social History   Socioeconomic History  . Marital status: Single    Spouse name: Not on file  . Number of children: 1  . Years of education: Not on file  . Highest education level: Not on file  Occupational History  . Occupation: Homemaker  Tobacco Use  . Smoking status: Never Smoker  . Smokeless tobacco: Never Used  Vaping Use  . Vaping Use: Never used  Substance and Sexual Activity  . Alcohol use: No    Comment: rare  . Drug use: No  . Sexual activity: Yes    Birth control/protection: Condom  Other Topics Concern  . Not on file  Social History Narrative  . Not on file   Social Determinants of Health   Financial Resource Strain:   . Difficulty of Paying Living Expenses:   Food Insecurity:   . Worried About Charity fundraiser in the Last Year:   . Arboriculturist in the Last Year:   Transportation Needs:   . Film/video editor (Medical):   [name] Lack of Transportation (Non-Medical):   Physical Activity:   . Days of Exercise per Week:   . Minutes of Exercise per Session:   Stress:   . Feeling of Stress :   Social Connections:   . Frequency of Communication with Friends and Family:   . Frequency of Social Gatherings with Friends and Family:   . Attends Religious Services:   . Active Member of Clubs or Organizations:   . Attends Archivist Meetings:   [name] Marital Status:   Intimate Partner Violence:   . Fear of Current or Ex-Partner:   . Emotionally Abused:   [name] Physically Abused:   . Sexually Abused:     Past Surgical Hx:  Past Surgical History:  Procedure Laterality Date  . ABDOMINAL HYSTERECTOMY  10/2011   complete  . CHOLECYSTECTOMY N/A 07/08/2015   Procedure: LAPAROSCOPIC CHOLECYSTECTOMY WITH INTRAOPERATIVE CHOLANGIOGRAM;  Surgeon: Judeth Horn, MD;  Location: [place];  Service: General;  Laterality: N/A;  . HIATAL HERNIA REPAIR    . REPAIR VAGINAL CUFF  10/07/2012   Procedure:  REPAIR VAGINAL CUFF;  Surgeon: [name], MD PHD;  Location: [facility];  Service: Gynecology;;  . ROBOTIC ASSISTED TOTAL HYSTERECTOMY WITH BILATERAL SALPINGO OOPHERECTOMY  10/07/2012   Procedure: ROBOTIC ASSISTED TOTAL HYSTERECTOMY WITH BILATERAL SALPINGO OOPHORECTOMY;  Surgeon: [name], MD PHD;  Location: [facility];  Service: Gynecology;  Laterality: Bilateral;    Past Medical Hx:  Past Medical History:  Diagnosis Date  . Anemia   . Anxiety   . Arthritis    ,Knees, back  . Blood in stool 08/28/2012  . Congenital birth defect    Right hand  . Depression   . Educated about COVID-19 virus infection 04/16/2019  . Endometrial cancer ([place])   . HLD (hyperlipidemia)    borderline  . Hx of echocardiogram    Echo (9/15): EF 50-55%, normal wall motion, grade 2 diastolic dysfunction, mild LAE  . Hx of radiation therapy 04/07/13- 05/14/13   pelvis 45 gray 25 fx  .  Hypertension   . Obesity   . Swelling    ANKLES - TAKES LASIX  . Wrist pain 08/28/2012    Family Hx:  Family History  Problem Relation Age of Onset  . Diabetes Mother   . Pancreatic cancer Maternal Grandfather        or liver cancer  . Hypertension Maternal Grandfather   . Cancer Maternal Grandfather        pancreatic  . Hypertension Maternal Grandmother   . Colon polyps Maternal Aunt   . Cancer Maternal Aunt        lung  . Colon cancer Cousin   . Cancer Cousin        lung  . Lung cancer Maternal Aunt   . Stroke Cousin   . Heart attack Neg Hx     Vitals:  Last menstrual period 09/20/2012.  Physical Exam: *** {Physical BVQX:[id number]}  Assessment/Plan: Deborah Crocker, MD 05/10/2020, 10:47 PM

## 2020-05-10 NOTE — Assessment & Plan Note (Deleted)
H/O grade 2 endometrial adenocarcinoma  Negative symptom review, normal exam.  No evidence of recurrence  >return in 1 yr; f/u w/a generalist is apropriate

## 2020-05-11 ENCOUNTER — Inpatient Hospital Stay: Payer: Medicaid Other | Admitting: Obstetrics & Gynecology

## 2020-05-11 ENCOUNTER — Encounter: Payer: Self-pay | Admitting: Gynecologic Oncology

## 2020-05-11 ENCOUNTER — Telehealth: Payer: Self-pay | Admitting: *Deleted

## 2020-05-11 DIAGNOSIS — C541 Malignant neoplasm of endometrium: Secondary | ICD-10-CM | POA: Insufficient documentation

## 2020-05-11 NOTE — Telephone Encounter (Signed)
Patient not feeling well and moved her appt from today to 7/6

## 2020-05-17 DIAGNOSIS — I10 Essential (primary) hypertension: Secondary | ICD-10-CM | POA: Insufficient documentation

## 2020-05-17 DIAGNOSIS — D649 Anemia, unspecified: Secondary | ICD-10-CM | POA: Insufficient documentation

## 2020-05-17 NOTE — Progress Notes (Signed)
Cardiology Office Note   Date:  05/18/2020   ID:  Deborah, [name] October 11, 1966, MRN [id number]  PCP:  Patriciaann Clan, DO  Cardiologist:   Minus Breeding, MD   Chief Complaint  Patient presents with  . Palpitations      History of Present Illness:  Deborah [name] is a 54 y.o. female who presents for follow up of a reduced EF.  She was seen here in 2000 and and did have a stress perfusion study. This demonstrated possibly a reduced ejection fraction of 47%. There was artifact. There was perhaps some hypoperfusion of the anterior septum. Cardiac catheterization was planned but this did not happen. I saw her in 2016 and I ordered dobutamine echocardiogram. This confirmed the ejection fraction was somewhat low at 35%. I titrated meds and in 2016 her EF was low normal (45 - 50%) and again in 2019.     Since I last saw her she has done well.  She has rare palpitations.  She walks to the mailbox in the dumpster and she has no limitations with this. The patient denies any new symptoms such as chest discomfort, neck or arm discomfort. There has been no new shortness of breath, PND or orthopnea. There have been no reported palpitations, presyncope or syncope.     Past Medical History:  Diagnosis Date  . Anemia   . Anxiety   . Arthritis    ,Knees, back  . Blood in stool 08/28/2012  . Congenital birth defect    Right hand  . Depression   . Educated about COVID-19 virus infection 04/16/2019  . Endometrial cancer ([place])   . HLD (hyperlipidemia)    borderline  . Hx of echocardiogram    Echo (9/15): EF 50-55%, normal wall motion, grade 2 diastolic dysfunction, mild LAE  . Hx of radiation therapy 04/07/13- 05/14/13   pelvis 45 gray 25 fx  . Hypertension   . Obesity   . Swelling    ANKLES - TAKES LASIX  . Wrist pain 08/28/2012    Past Surgical History:  Procedure Laterality Date  . ABDOMINAL HYSTERECTOMY  10/2011   complete  . CHOLECYSTECTOMY N/A 07/08/2015   Procedure:  LAPAROSCOPIC CHOLECYSTECTOMY WITH INTRAOPERATIVE CHOLANGIOGRAM;  Surgeon: Judeth Horn, MD;  Location: [facility];  Service: General;  Laterality: N/A;  . HIATAL HERNIA REPAIR    . REPAIR VAGINAL CUFF  10/07/2012   Procedure: REPAIR VAGINAL CUFF;  Surgeon: [name], MD PHD;  Location: [facility];  Service: Gynecology;;  . ROBOTIC ASSISTED TOTAL HYSTERECTOMY WITH BILATERAL SALPINGO OOPHERECTOMY  10/07/2012   Procedure: ROBOTIC ASSISTED TOTAL HYSTERECTOMY WITH BILATERAL SALPINGO OOPHORECTOMY;  Surgeon: [name], MD PHD;  Location: [facility];  Service: Gynecology;  Laterality: Bilateral;     Current Outpatient Medications  Medication Sig Dispense Refill  . acetaminophen (TYLENOL) 500 MG tablet Take 1,000 mg by mouth every 6 (six) hours as needed for moderate pain (pain).    . bifidobacterium infantis (ALIGN) capsule Take 1 capsule by mouth daily as needed (for regularity). Reported on 02/23/2016    . carvedilol (COREG) 3.125 MG tablet Take one (1) tablet (3.125 mg) by mouth each morning. Take two (2) tablets (6.25 mg) by mouth each evening. 180 tablet 3  . diazepam (VALIUM) 5 MG tablet Take 1 tablet (5 mg total) by mouth every 12 (twelve) hours as needed for anxiety. 30 tablet 0  . diclofenac sodium (VOLTAREN) 1 % GEL Apply 4 g topically 4 (four) times daily.  150 g 1  . fluticasone (FLONASE) 50 MCG/ACT nasal spray Place 2 sprays into both nostrils daily. 16 g 0  . furosemide (LASIX) 20 MG tablet Take 1 tablet (20 mg total) by mouth daily as needed for fluid. 30 tablet 0  . ibuprofen (ADVIL,MOTRIN) 800 MG tablet Take 1 tablet (800 mg total) by mouth every 8 (eight) hours as needed. 60 tablet 0  . lactase (LACTAID) 3000 UNITS tablet Take 1 tablet by mouth 3 (three) times daily as needed (for dairy intake). Reported on 02/23/2016    . loperamide (IMODIUM) 2 MG capsule Take 2 mg by mouth 4 (four) times daily as needed for diarrhea or loose stools (diarrhea). Reported on 02/23/2016    . magnesium chloride  (SLOW-MAG) 64 MG TBEC SR tablet Take 1-2 tablets by mouth 2 (two) times daily. Takes two in the morning and one in the evening    . omeprazole (PRILOSEC) 20 MG capsule TAKE 1 CAPSULE(20 MG) BY MOUTH DAILY 90 capsule 0  . polyethylene glycol powder (GLYCOLAX/MIRALAX) powder Take 17 g by mouth daily as needed. 3350 g 1  . potassium chloride (K-DUR,KLOR-CON) 10 MEQ tablet Take 2 tablets (20 mEq total) by mouth 2 (two) times daily. 120 tablet 0  . sertraline (ZOLOFT) 100 MG tablet Take 1 tablet (100 mg total) by mouth every evening. 30 tablet 1  . sertraline (ZOLOFT) 50 MG tablet Take 1 tablet (50 mg total) by mouth daily. Start with 1/2 tablet for a few days. 30 tablet 1  . tiZANidine (ZANAFLEX) 4 MG tablet Take 1 tablet (4 mg total) by mouth every 6 (six) hours as needed for muscle spasms. 30 tablet 0   No current facility-administered medications for this visit.    Allergies:   Lactose intolerance (gi)   ROS:  Please see the history of present illness.   Otherwise, review of systems are positive for none.   All other systems are reviewed and negative.    PHYSICAL EXAM: VS:  BP 130/80   Pulse 99   Temp (!) 96.3 F (35.7 C)   Ht 5\' 6"  (1.676 m)   Wt (!) 305 lb (138.3 kg)   LMP 09/20/2012   SpO2 97%   BMI 49.23 kg/m  , BMI Body mass index is 49.23 kg/m. GENERAL:  Well appearing NECK:  No jugular venous distention, waveform within normal limits, carotid upstroke brisk and symmetric, no bruits, no thyromegaly LUNGS:  Clear to auscultation bilaterally CHEST:  Unremarkable HEART:  PMI not displaced or sustained,S1 and S2 within normal limits, no S3, no S4, no clicks, no rubs, no murmurs ABD:  Flat, positive bowel sounds normal in frequency in pitch, no bruits, no rebound, no guarding, no midline pulsatile mass, no hepatomegaly, no splenomegaly EXT:  2 plus pulses throughout, no edema, no cyanosis no clubbing, absent digits congenitally right hand   EKG:  EKG is ordered today. The ekg  ordered today demonstrates NSR rate 99, axis within normal limits, intervals within normal limits, no acute ST-T wave changes.   Recent Labs: No results found for requested labs within last 8760 hours.    Lipid Panel    Component Value Date/Time   CHOL 162 07/17/2013 0923   TRIG 152 (H) 07/17/2013 0923   HDL 32 (L) 07/17/2013 0923   CHOLHDL 5.1 07/17/2013 0923   VLDL 30 07/17/2013 0923   LDLCALC 100 (H) 07/17/2013 0923   LDLDIRECT 130 (H) 02/14/2012 1424      Wt Readings from Last 3  Encounters:  05/18/20 (!) 305 lb (138.3 kg)  04/16/19 (!) 309 lb (140.2 kg)  12/24/18 (!) 306 lb 3.2 oz (138.9 kg)      Other studies Reviewed: Additional studies/ records that were reviewed today include: Labs. Review of the above records demonstrates:  Please see elsewhere in the note.     ASSESSMENT AND PLAN:  Chronic systolic and diastolic HF:      The patient seems to be euvolemic.  I am going to check an echocardiogram in September as it has been 1 year.  No change in therapy otherwise.   Hypertension:   Her blood pressure is controlled.  She will continue on the meds as listed.  Anemia:   She is to have follow-up with her primary provider.  I do not see a recent CBC.  I will defer management.   Covid education: The patient has not had her vaccine.  We talked about this and she was educated.  Questions were answered.   Current medicines are reviewed at length with the patient today.  The patient does not have concerns regarding medicines.  The following changes have been made:  no change  Labs/ tests ordered today include:   Orders Placed This Encounter  Procedures  . EKG 12-Lead  . ECHOCARDIOGRAM COMPLETE     Disposition:   FU with me in one year.     Signed, Minus Breeding, MD  05/18/2020 3:29 PM    [facility]

## 2020-05-18 ENCOUNTER — Ambulatory Visit (INDEPENDENT_AMBULATORY_CARE_PROVIDER_SITE_OTHER): Payer: Medicaid Other | Admitting: Cardiology

## 2020-05-18 ENCOUNTER — Encounter: Payer: Self-pay | Admitting: Cardiology

## 2020-05-18 ENCOUNTER — Other Ambulatory Visit: Payer: Self-pay

## 2020-05-18 VITALS — BP 130/80 | HR 99 | Temp 96.3°F | Ht 66.0 in | Wt 305.0 lb

## 2020-05-18 DIAGNOSIS — I10 Essential (primary) hypertension: Secondary | ICD-10-CM

## 2020-05-18 DIAGNOSIS — D649 Anemia, unspecified: Secondary | ICD-10-CM

## 2020-05-18 DIAGNOSIS — Z7189 Other specified counseling: Secondary | ICD-10-CM

## 2020-05-18 DIAGNOSIS — I5042 Chronic combined systolic (congestive) and diastolic (congestive) heart failure: Secondary | ICD-10-CM | POA: Diagnosis not present

## 2020-05-18 DIAGNOSIS — Z7689 Persons encountering health services in other specified circumstances: Secondary | ICD-10-CM | POA: Diagnosis not present

## 2020-05-18 DIAGNOSIS — I429 Cardiomyopathy, unspecified: Secondary | ICD-10-CM | POA: Diagnosis not present

## 2020-05-18 MED ORDER — CARVEDILOL 3.125 MG PO TABS: ORAL_TABLET | ORAL | 3 refills | Status: DC

## 2020-05-18 NOTE — Patient Instructions (Signed)
Medication Instructions:  Your physician recommends that you continue on your current medications as directed. Please refer to the Current Medication list given to you today.  *If you need a refill on your cardiac medications before your next appointment, please call your pharmacy*  Lab Work: NONE  Testing/Procedures: Your physician has requested that you have an echocardiogram. Echocardiography is a painless test that uses sound waves to create images of your heart. It provides your doctor with information about the size and shape of your heart and how well your hearts chambers and valves are working. This procedure takes approximately one hour. There are no restrictions for this procedure. IN September   Follow-Up: At [facility], you and your health needs are our priority.  As part of our continuing mission to provide you with exceptional heart care, we have created designated Provider Care Teams.  These Care Teams include your primary Cardiologist (physician) and Advanced Practice Providers (APPs -  Physician Assistants and Nurse Practitioners) who all work together to provide you with the care you need, when you need it.  We recommend signing up for the patient portal called "MyChart".  Sign up information is provided on this After Visit Summary.  MyChart is used to connect with patients for Virtual Visits (Telemedicine).  Patients are able to view lab/test results, encounter notes, upcoming appointments, etc.  Non-urgent messages can be sent to your provider as well.   To learn more about what you can do with MyChart, go to [email or web address].    Your next appointment:   12 month(s) You will receive a reminder letter in the mail two months in advance. If you don't receive a letter, please call our office to schedule the follow-up appointment.  The format for your next appointment:   In Person  Provider:   You may see Minus Breeding, MD or one of the following Advanced Practice  Providers on your designated Care Team:    Rosaria Ferries, PA-C  Jory Sims, DNP, ANP  Cadence Kathlen Mody, NP

## 2020-05-23 ENCOUNTER — Ambulatory Visit: Payer: Medicaid Other | Admitting: Family Medicine

## 2020-05-31 NOTE — Progress Notes (Signed)
Follow Up Note: Gyn-Onc  Deborah [name] 54 y.o. 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   History of endometrial cancer  10/07/2012 Cancer Staging   Cancer Staging History of endometrial cancer Staging form: Corpus Uteri - Carcinoma, AJCC 7th Edition - Clinical: Stage Unknown (T1a, NX, M0) - Unsigned - Pathologic: Stage Unknown (T1a, NX, M0, Free text: IIIC) - Unsigned    10/08/2012 Initial Diagnosis   Endometrial cancer   11/25/2012 Imaging   CT: [place] bilateral external iliac lymphadenopathy measuring up to 1.3 cm, suspicious for metastatic disease. Nonspecific less than 5 mm retroperitoneal lymph nodes also noted in the left para-aortic region. 2. 2.7 cm postop lymphocele versus low attenuation lymphadenopathy in the proximal left external iliac chain. 3. Cholelithiasis and right nephrolithiasis incidentally noted.   04/07/2013 - 05/25/2013 Radiation Therapy   XRT   08/25/2013 Imaging   CT: no evidence of abdominal lymphadenopathy. There Is no evidence of inflammatory process abscess or ascites the adnexal regions are unremarkable in appearance there is no new or progressive disease within the abdomen or pelvis    - 07/24/2013 Chemotherapy   The patient had 6 cycles of Taxol/Carboplatin and pelvic XRT sandwich fashion      Interval History: She denies any vaginal bleeding, abdominal/pelvic pain, cough, lethargy or abdominal distention.  She c/o excessive sweating in the groin w/occasional malodor.    Review of Systems  Review of Systems  Constitutional: Negative for malaise/fatigue and weight loss.  Respiratory: Negative for cough.   Gastrointestinal: Negative for abdominal pain.   Genitourinary:       No vaginal bleeding  Psychiatric/Behavioral: Negative.     Current Meds:  Outpatient Encounter Medications as of 06/01/2020  Medication Sig  . acetaminophen (TYLENOL) 500 MG tablet Take 1,000 mg by mouth every 6 (six) hours as needed for moderate pain (pain).  . bifidobacterium infantis (ALIGN) capsule Take 1 capsule by mouth daily as needed (for regularity). Reported on 02/23/2016  . carvedilol (COREG) 3.125 MG tablet Take one (1) tablet (3.125 mg) by mouth each morning. Take two (2) tablets (6.25 mg) by mouth each evening.  . diazepam (VALIUM) 5 MG tablet Take 1 tablet (5 mg total) by mouth every 12 (twelve) hours as needed for anxiety.  . diclofenac sodium (VOLTAREN) 1 % GEL Apply 4 g topically 4 (four) times daily.  . fluticasone (FLONASE) 50 MCG/ACT nasal spray Place 2 sprays into both nostrils daily.  . furosemide (LASIX) 20 MG tablet Take 1 tablet (20 mg total) by mouth daily as needed for fluid.  [name] ibuprofen (ADVIL,MOTRIN) 800 MG tablet Take 1 tablet (800 mg total) by mouth every 8 (eight) hours as needed.  . lactase (LACTAID) 3000 UNITS tablet Take 1 tablet by mouth 3 (three) times daily as needed (for dairy intake). Reported on 02/23/2016  . loperamide (IMODIUM) 2 MG capsule Take 2 mg by mouth 4 (four) times daily as needed for diarrhea or loose stools (diarrhea). Reported on 02/23/2016  . magnesium chloride (SLOW-MAG) 64 MG TBEC SR tablet Take 1-2 tablets by mouth 2 (two) times daily. Takes two in the morning and one in the evening  . omeprazole (PRILOSEC) 20 MG capsule TAKE 1 CAPSULE(20 MG) BY MOUTH DAILY  . polyethylene glycol powder (GLYCOLAX/MIRALAX) powder Take 17 g by mouth daily  as needed.  . potassium chloride (K-DUR,KLOR-CON) 10 MEQ tablet Take 2 tablets (20 mEq total) by mouth 2 (two) times daily.  . sertraline (ZOLOFT) 100 MG tablet Take 1 tablet (100 mg total) by mouth every evening.  . sertraline (ZOLOFT) 50 MG tablet Take 1 tablet (50 mg total) by  mouth daily. Start with 1/2 tablet for a few days.  [name] tiZANidine (ZANAFLEX) 4 MG tablet Take 1 tablet (4 mg total) by mouth every 6 (six) hours as needed for muscle spasms.   No facility-administered encounter medications on file as of 06/01/2020.    Allergy:  Allergies  Allergen Reactions  . Lactose Intolerance (Gi) Diarrhea    Social Hx:   Social History   Socioeconomic History  . Marital status: Single    Spouse name: Not on file  . Number of children: 1  . Years of education: Not on file  . Highest education level: Not on file  Occupational History  . Occupation: Homemaker  Tobacco Use  . Smoking status: Never Smoker  . Smokeless tobacco: Never Used  Vaping Use  . Vaping Use: Never used  Substance and Sexual Activity  . Alcohol use: No    Comment: rare  . Drug use: No  . Sexual activity: Yes    Birth control/protection: Condom  Other Topics Concern  . Not on file  Social History Narrative  . Not on file   Social Determinants of Health   Financial Resource Strain:   . Difficulty of Paying Living Expenses:   Food Insecurity:   . Worried About Charity fundraiser in the Last Year:   . Arboriculturist in the Last Year:   Transportation Needs:   . Film/video editor (Medical):   [name] Lack of Transportation (Non-Medical):   Physical Activity:   . Days of Exercise per Week:   . Minutes of Exercise per Session:   Stress:   . Feeling of Stress :   Social Connections:   . Frequency of Communication with Friends and Family:   . Frequency of Social Gatherings with Friends and Family:   . Attends Religious Services:   . Active Member of Clubs or Organizations:   . Attends Archivist Meetings:   [name] Marital Status:   Intimate Partner Violence:   . Fear of Current or Ex-Partner:   . Emotionally Abused:   [name] Physically Abused:   . Sexually Abused:     Past Surgical Hx:  Past Surgical History:  Procedure Laterality Date  . ABDOMINAL HYSTERECTOMY   10/2011   complete  . CHOLECYSTECTOMY N/A 07/08/2015   Procedure: LAPAROSCOPIC CHOLECYSTECTOMY WITH INTRAOPERATIVE CHOLANGIOGRAM;  Surgeon: Judeth Horn, MD;  Location: [facility];  Service: General;  Laterality: N/A;  . HIATAL HERNIA REPAIR    . REPAIR VAGINAL CUFF  10/07/2012   Procedure: REPAIR VAGINAL CUFF;  Surgeon: [name], MD PHD;  Location: [facility];  Service: Gynecology;;  . ROBOTIC ASSISTED TOTAL HYSTERECTOMY WITH BILATERAL SALPINGO OOPHERECTOMY  10/07/2012   Procedure: ROBOTIC ASSISTED TOTAL HYSTERECTOMY WITH BILATERAL SALPINGO OOPHORECTOMY;  Surgeon: [name], MD PHD;  Location: [facility];  Service: Gynecology;  Laterality: Bilateral;    Past Medical Hx:  Past Medical History:  Diagnosis Date  . Anemia   . Anxiety   . Arthritis    ,Knees, back  . Blood in stool 08/28/2012  . Congenital birth defect    Right hand  . Depression   . Educated about COVID-19 virus infection 04/16/2019  .  Endometrial cancer ([place])   . HLD (hyperlipidemia)    borderline  . Hx of echocardiogram    Echo (9/15): EF 50-55%, normal wall motion, grade 2 diastolic dysfunction, mild LAE  . Hx of radiation therapy 04/07/13- 05/14/13   pelvis 45 gray 25 fx  . Hypertension   . Obesity   . Swelling    ANKLES - TAKES LASIX  . Wrist pain 08/28/2012    Family Hx:  Family History  Problem Relation Age of Onset  . Diabetes Mother   . Pancreatic cancer Maternal Grandfather        or liver cancer  . Hypertension Maternal Grandfather   . Cancer Maternal Grandfather        pancreatic  . Hypertension Maternal Grandmother   . Colon polyps Maternal Aunt   . Cancer Maternal Aunt        lung  . Colon cancer Cousin   . Cancer Cousin        lung  . Lung cancer Maternal Aunt   . Stroke Cousin   . Heart attack Neg Hx     Vitals: BP 109/80 (BP Location: Left Wrist, Patient Position: Sitting)   Pulse 74   Temp 97.8 F (36.6 C) (Oral)   Resp 18   Ht 5\' 6"  (1.676 m)   Wt (!) 301 lb 6 oz (136.7 kg)    LMP 09/20/2012   SpO2 100%   BMI 48.64 kg/m  Physical Exam: Physical Exam Exam conducted with a chaperone present.  Constitutional:      Appearance: She is obese. She is not ill-appearing.  Abdominal:     General: There is no distension.     Palpations: Abdomen is soft. There is no mass.     Tenderness: There is no abdominal tenderness.  Genitourinary:    General: Normal vulva.     Exam position: Lithotomy position.     Vagina: Normal. No lesions.     Uterus: Absent.      Comments: Hyperpigmented skin groin, vulva. Cervix absent.   Lymphadenopathy:     Lower Body: No right inguinal adenopathy. No left inguinal adenopathy.  Skin:    General: Skin is warm and dry.  Neurological:     General: No focal deficit present.  Psychiatric:        Mood and Affect: Mood normal.     Assessment/Plan:  History of endometrial cancer H/O grade 2 endometrial adenocarcinoma Negative symptoms review, normal exam.  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    Lahoma Crocker, MD 05/31/2020, 11:43 PM

## 2020-05-31 NOTE — Assessment & Plan Note (Addendum)
H/O grade 2 endometrial adenocarcinoma Negative symptoms review, normal exam.  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

## 2020-06-01 ENCOUNTER — Inpatient Hospital Stay: Payer: Medicaid Other | Attending: Obstetrics & Gynecology | Admitting: Obstetrics & Gynecology

## 2020-06-01 ENCOUNTER — Encounter: Payer: Self-pay | Admitting: Obstetrics & Gynecology

## 2020-06-01 ENCOUNTER — Other Ambulatory Visit: Payer: Self-pay

## 2020-06-01 VITALS — BP 109/80 | HR 74 | Temp 97.8°F | Resp 18 | Ht 66.0 in | Wt 301.4 lb

## 2020-06-01 DIAGNOSIS — M1990 Unspecified osteoarthritis, unspecified site: Secondary | ICD-10-CM | POA: Insufficient documentation

## 2020-06-01 DIAGNOSIS — I10 Essential (primary) hypertension: Secondary | ICD-10-CM | POA: Diagnosis not present

## 2020-06-01 DIAGNOSIS — C541 Malignant neoplasm of endometrium: Secondary | ICD-10-CM

## 2020-06-01 DIAGNOSIS — Z923 Personal history of irradiation: Secondary | ICD-10-CM | POA: Insufficient documentation

## 2020-06-01 DIAGNOSIS — Z79899 Other long term (current) drug therapy: Secondary | ICD-10-CM | POA: Diagnosis not present

## 2020-06-01 DIAGNOSIS — Z9221 Personal history of antineoplastic chemotherapy: Secondary | ICD-10-CM | POA: Diagnosis not present

## 2020-06-01 DIAGNOSIS — Z8542 Personal history of malignant neoplasm of other parts of uterus: Secondary | ICD-10-CM | POA: Diagnosis not present

## 2020-06-01 DIAGNOSIS — Z08 Encounter for follow-up examination after completed treatment for malignant neoplasm: Secondary | ICD-10-CM | POA: Diagnosis not present

## 2020-06-01 DIAGNOSIS — Z90710 Acquired absence of both cervix and uterus: Secondary | ICD-10-CM

## 2020-06-01 DIAGNOSIS — F419 Anxiety disorder, unspecified: Secondary | ICD-10-CM | POA: Diagnosis not present

## 2020-06-01 DIAGNOSIS — F329 Major depressive disorder, single episode, unspecified: Secondary | ICD-10-CM | POA: Diagnosis not present

## 2020-06-01 DIAGNOSIS — E669 Obesity, unspecified: Secondary | ICD-10-CM | POA: Insufficient documentation

## 2020-06-01 DIAGNOSIS — Z791 Long term (current) use of non-steroidal anti-inflammatories (NSAID): Secondary | ICD-10-CM | POA: Diagnosis not present

## 2020-06-01 DIAGNOSIS — E785 Hyperlipidemia, unspecified: Secondary | ICD-10-CM | POA: Diagnosis not present

## 2020-06-01 DIAGNOSIS — Z90722 Acquired absence of ovaries, bilateral: Secondary | ICD-10-CM | POA: Insufficient documentation

## 2020-06-01 NOTE — Patient Instructions (Signed)
Return in 1 yr 

## 2020-06-07 ENCOUNTER — Other Ambulatory Visit: Payer: Self-pay

## 2020-06-07 MED ORDER — DIAZEPAM 5 MG PO TABS: 5.0000 mg | ORAL_TABLET | Freq: Two times a day (BID) | ORAL | 0 refills | Status: DC | PRN

## 2020-06-14 ENCOUNTER — Ambulatory Visit: Payer: Medicaid Other | Admitting: Family Medicine

## 2020-06-17 ENCOUNTER — Ambulatory Visit: Payer: Medicaid Other | Admitting: Family Medicine

## 2020-06-22 ENCOUNTER — Ambulatory Visit: Payer: Medicaid Other | Admitting: Student in an Organized Health Care Education/Training Program

## 2020-07-07 ENCOUNTER — Encounter: Payer: Self-pay | Admitting: Family Medicine

## 2020-07-07 ENCOUNTER — Other Ambulatory Visit: Payer: Self-pay

## 2020-07-07 ENCOUNTER — Ambulatory Visit (INDEPENDENT_AMBULATORY_CARE_PROVIDER_SITE_OTHER): Payer: Medicaid Other | Admitting: Family Medicine

## 2020-07-07 VITALS — BP 127/80 | HR 97 | Wt 300.0 lb

## 2020-07-07 DIAGNOSIS — Z862 Personal history of diseases of the blood and blood-forming organs and certain disorders involving the immune mechanism: Secondary | ICD-10-CM | POA: Diagnosis not present

## 2020-07-07 DIAGNOSIS — R140 Abdominal distension (gaseous): Secondary | ICD-10-CM

## 2020-07-07 DIAGNOSIS — Z6841 Body Mass Index (BMI) 40.0 and over, adult: Secondary | ICD-10-CM | POA: Diagnosis not present

## 2020-07-07 DIAGNOSIS — F419 Anxiety disorder, unspecified: Secondary | ICD-10-CM | POA: Diagnosis not present

## 2020-07-07 DIAGNOSIS — R7989 Other specified abnormal findings of blood chemistry: Secondary | ICD-10-CM | POA: Diagnosis not present

## 2020-07-07 DIAGNOSIS — Q681 Congenital deformity of finger(s) and hand: Secondary | ICD-10-CM

## 2020-07-07 DIAGNOSIS — F329 Major depressive disorder, single episode, unspecified: Secondary | ICD-10-CM | POA: Diagnosis not present

## 2020-07-07 DIAGNOSIS — Z0000 Encounter for general adult medical examination without abnormal findings: Secondary | ICD-10-CM | POA: Diagnosis not present

## 2020-07-07 DIAGNOSIS — Z114 Encounter for screening for human immunodeficiency virus [HIV]: Secondary | ICD-10-CM

## 2020-07-07 DIAGNOSIS — K589 Irritable bowel syndrome without diarrhea: Secondary | ICD-10-CM

## 2020-07-07 DIAGNOSIS — I10 Essential (primary) hypertension: Secondary | ICD-10-CM

## 2020-07-07 DIAGNOSIS — Z1159 Encounter for screening for other viral diseases: Secondary | ICD-10-CM

## 2020-07-07 DIAGNOSIS — Z1211 Encounter for screening for malignant neoplasm of colon: Secondary | ICD-10-CM | POA: Diagnosis not present

## 2020-07-07 LAB — POCT GLYCOSYLATED HEMOGLOBIN (HGB A1C): Hemoglobin A1C: 6.4 % — AB (ref 4.0–5.6)

## 2020-07-07 MED ORDER — DIAZEPAM 5 MG PO TABS: 5.0000 mg | ORAL_TABLET | Freq: Two times a day (BID) | ORAL | 0 refills | Status: DC | PRN

## 2020-07-07 MED ORDER — SERTRALINE HCL 50 MG PO TABS: 50.0000 mg | ORAL_TABLET | Freq: Every day | ORAL | 1 refills | Status: DC

## 2020-07-07 MED ORDER — POLYETHYLENE GLYCOL 3350 17 GM/SCOOP PO POWD: 17.0000 g | Freq: Every day | ORAL | 1 refills | Status: AC | PRN

## 2020-07-07 NOTE — Patient Instructions (Addendum)
It was so wonderful seeing you today.  For your abdomen: Please start keeping a journal for how often in when your belly feels uncomfortable, jot down what food you had prior to this.  We can then start to see if there is a comparison what you are eating.  You can also use MiraLAX 1 capful daily to help make sure that you have a regular bowel movement every single day.  For gassiness, you can also try simethicone drops over-the-counter to help.  Sure you are drinking plenty of water.  For your anxiety: Restart taking the Zoloft at bedtime on a daily basis.  Hopeful that this will reduce your need for the Valium in the future.  Lastly, we agreed to check some labs today which should be back in the next few days.

## 2020-07-07 NOTE — Progress Notes (Signed)
    SUBJECTIVE:   CHIEF COMPLAINT / HPI: Follow-up anxiety  Camera is a 54 year old female presenting discussed the following:  Anxiety: Stop taking the Zoloft several months ago after discussion in 12/2019 because it made her feel sleepy, otherwise tolerated well.  Still using Valium once daily to calm her nerves.  She often stresses about her state of health and previous cancer diagnosis.  Abdominal pain: Present for at least several months.  Feels bloated with frequent gassiness after certain foods, not sure which ones.  Goes back and forth between frequent bowel movements and constipation.  Bowel movements do provide relief in her abdominal bloating.  No associated nausea, vomiting, melena, hematochezia, unexpected weight loss.  No personal or familial history of IBD.  Does have a history of lactose intolerance, avoids this already.  Due for her first screening colonoscopy.  Previous cholecystectomy (and hysterectomy) several years ago.  She also reports concern of her kidneys (was told the last time her creatinine was checked it was a little bumped up) and previous history of anemia, would like to check these today.  Healthcare maintenance: Hepatitis C screening, HIV screening, colonoscopy, Covid vaccine.  She is not interested in obtaining hep C/HIV today, would like to postpone to follow-up visit.    Office Visit from 07/07/2020 in [place]  Thoughts that you would be better off dead, or of hurting yourself in some way Not at all  PHQ-9 Total Score 5     GAD 7 : Generalized Anxiety Score 07/11/2020  Nervous, Anxious, on Edge 2  Control/stop worrying 1  Worry too much - different things 2  Trouble relaxing 1  Restless 1  Easily annoyed or irritable 1  Afraid - awful might happen 2  Total GAD 7 Score 10    PERTINENT  PMH / PSH: Hypertension, systolic/diastolic CHF, previous endometrial cancer hysterectomy with salpingo oophorectomy in 2013 with previous  radiation/chemotherapy, anxiety/depression, elevated BMI  OBJECTIVE:   BP 127/80   Pulse 97   Wt 300 lb (136.1 kg)   LMP 09/20/2012   SpO2 99%   BMI 48.42 kg/m   General: Alert, NAD HEENT: NCAT, MMM Cardiac: RRR  Lungs: Clear bilaterally, no increased WOB  Abdomen: soft, non-tender throughout all quadrants, non-distended, normoactive BS, unable to palpate for hepatosplenomegaly due to body habitus. 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  Discussed restarting Zoloft 50 mg at night to hopefully improve her symptomatology and decrease necessity for Valium in the future.  Abdominal bloating Clinical presentation suggestive of functional disorder such as IBS, vs food sensitivity.  Recommended keeping food journal to identify triggering foods and MiraLax to achieve one soft stool on a daily basis.  Will obtain CBC, CMP to rule out resulting anemia or electrolyte abnormality (and evaluate liver function) to suggest more invasive disorder.  Healthcare maintenance Placed referral to GI for colonoscopy.   Morbid obesity with BMI of 50.0-59.9, adult BMI improved at 48 today.  Will obtain screening lipid panel and A1c.  Essential hypertension Well-controlled on Coreg BID only.  Maintain regimen as is.    Follow-up in 1 month to check in on anxiety/abdomen, follow-up in 3 months afterwards.  Patriciaann Clan, [place]

## 2020-07-08 ENCOUNTER — Encounter: Payer: Self-pay | Admitting: Internal Medicine

## 2020-07-08 LAB — CBC
Hematocrit: 42.4 % (ref 34.0–46.6)
Hemoglobin: 13.9 g/dL (ref 11.1–15.9)
MCH: 27.5 pg (ref 26.6–33.0)
MCHC: 32.8 g/dL (ref 31.5–35.7)
MCV: 84 fL (ref 79–97)
Platelets: 373 10*3/uL (ref 150–450)
RBC: 5.06 x10E6/uL (ref 3.77–5.28)
RDW: 13.8 % (ref 11.7–15.4)
WBC: 6.6 10*3/uL (ref 3.4–10.8)

## 2020-07-08 LAB — COMPREHENSIVE METABOLIC PANEL
ALT: 16 IU/L (ref 0–32)
AST: 13 IU/L (ref 0–40)
Albumin/Globulin Ratio: 1.1 — ABNORMAL LOW (ref 1.2–2.2)
Albumin: 4.2 g/dL (ref 3.8–4.9)
Alkaline Phosphatase: 89 IU/L (ref 48–121)
BUN/Creatinine Ratio: 11 (ref 9–23)
BUN: 11 mg/dL (ref 6–24)
Bilirubin Total: 0.2 mg/dL (ref 0.0–1.2)
CO2: 25 mmol/L (ref 20–29)
Calcium: 9 mg/dL (ref 8.7–10.2)
Chloride: 97 mmol/L (ref 96–106)
Creatinine, Ser: 0.99 mg/dL (ref 0.57–1.00)
GFR calc Af Amer: 76 mL/min/{1.73_m2} (ref >59)
GFR calc non Af Amer: 66 mL/min/{1.73_m2} (ref >59)
Globulin, Total: 3.9 g/dL (ref 1.5–4.5)
Glucose: 116 mg/dL — ABNORMAL HIGH (ref 65–99)
Potassium: 3.8 mmol/L (ref 3.5–5.2)
Sodium: 138 mmol/L (ref 134–144)
Total Protein: 8.1 g/dL (ref 6.0–8.5)

## 2020-07-08 LAB — LIPID PANEL
Chol/HDL Ratio: 5 ratio — ABNORMAL HIGH (ref 0.0–4.4)
Cholesterol, Total: 189 mg/dL (ref 100–199)
HDL: 38 mg/dL — ABNORMAL LOW (ref >39)
LDL Chol Calc (NIH): 124 mg/dL — ABNORMAL HIGH (ref 0–99)
Triglycerides: 153 mg/dL — ABNORMAL HIGH (ref 0–149)
VLDL Cholesterol Cal: 27 mg/dL (ref 5–40)

## 2020-07-11 ENCOUNTER — Telehealth: Payer: Self-pay

## 2020-07-11 ENCOUNTER — Encounter: Payer: Self-pay | Admitting: Family Medicine

## 2020-07-11 DIAGNOSIS — R140 Abdominal distension (gaseous): Secondary | ICD-10-CM | POA: Insufficient documentation

## 2020-07-11 DIAGNOSIS — Q681 Congenital deformity of finger(s) and hand: Secondary | ICD-10-CM | POA: Insufficient documentation

## 2020-07-11 NOTE — Assessment & Plan Note (Signed)
Longstanding, poorly controlled.  Discussed restarting Zoloft 50 mg at night to hopefully improve her symptomatology and decrease necessity for Valium in the future.

## 2020-07-11 NOTE — Assessment & Plan Note (Signed)
Well-controlled on Coreg BID only.  Maintain regimen as is.

## 2020-07-11 NOTE — Assessment & Plan Note (Signed)
BMI improved at 48 today.  Will obtain screening lipid panel and A1c.

## 2020-07-11 NOTE — Assessment & Plan Note (Signed)
Placed referral to GI for colonoscopy.

## 2020-07-11 NOTE — Telephone Encounter (Signed)
Sent patient a my chart result message earlier this morning and additionally sent via letter.  Will call if she has any questions after messages.  Patriciaann Clan, DO

## 2020-07-11 NOTE — Assessment & Plan Note (Addendum)
Clinical presentation suggestive of functional disorder such as IBS, vs food sensitivity.  Recommended keeping food journal to identify triggering foods and MiraLax to achieve one soft stool on a daily basis.  Will obtain CBC, CMP to rule out resulting anemia or electrolyte abnormality (and evaluate liver function) to suggest more invasive disorder.

## 2020-07-11 NOTE — Telephone Encounter (Signed)
Patient calls nurse line regarding lab results on 8/12. Please return call to patient at [phone]. Patient is also requesting a letter to be sent out with results.

## 2020-08-02 ENCOUNTER — Other Ambulatory Visit: Payer: Self-pay | Admitting: Cardiology

## 2020-08-12 ENCOUNTER — Other Ambulatory Visit (HOSPITAL_COMMUNITY): Payer: Medicaid Other

## 2020-08-15 IMAGING — MG DIGITAL SCREENING BILATERAL MAMMOGRAM WITH CAD
6 series · 6 of 6 positions shown · non-contrast
Comparison: Previous exam(s).

CLINICAL DATA: Screening.

EXAM:
DIGITAL SCREENING BILATERAL MAMMOGRAM WITH CAD

[L MLO (1 of 2)]
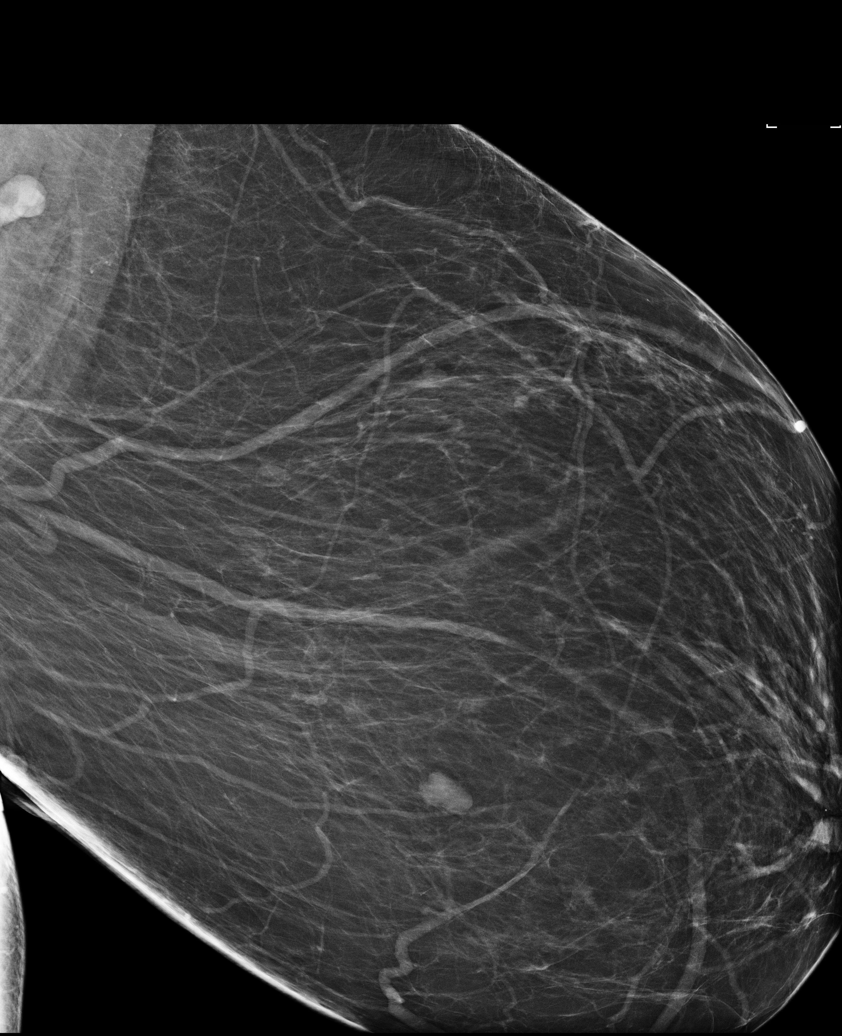

[L CC]
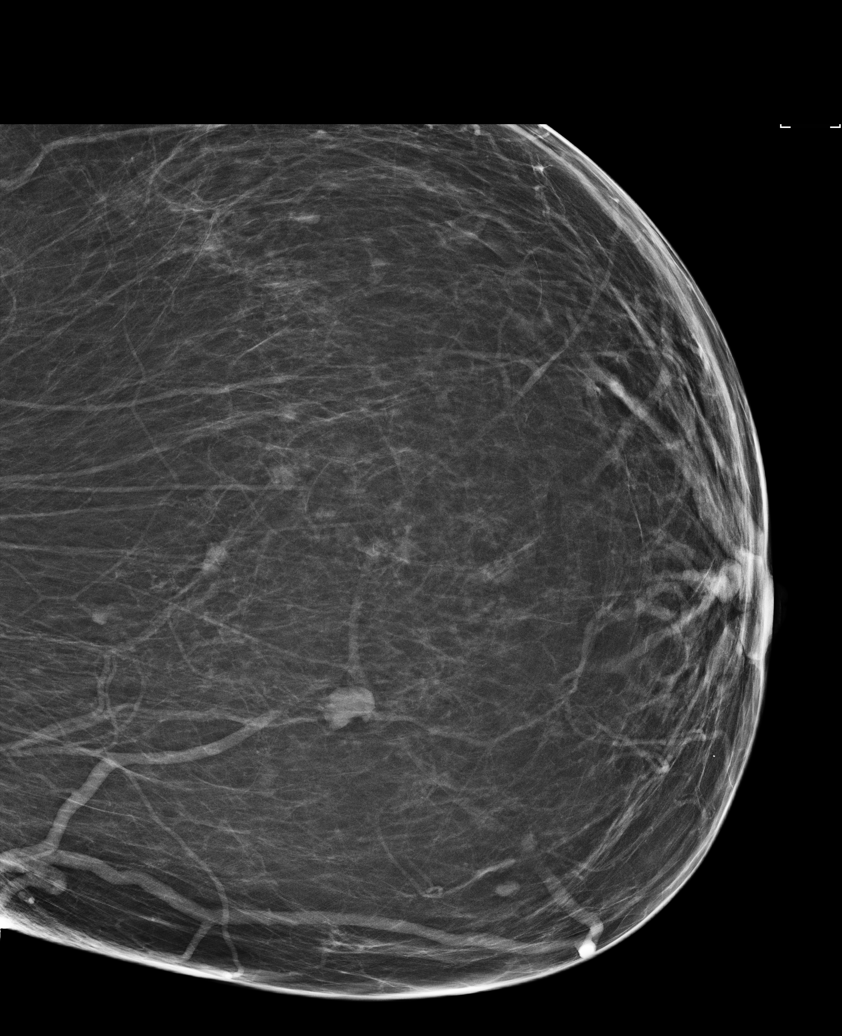

[R MLO (1 of 2)]
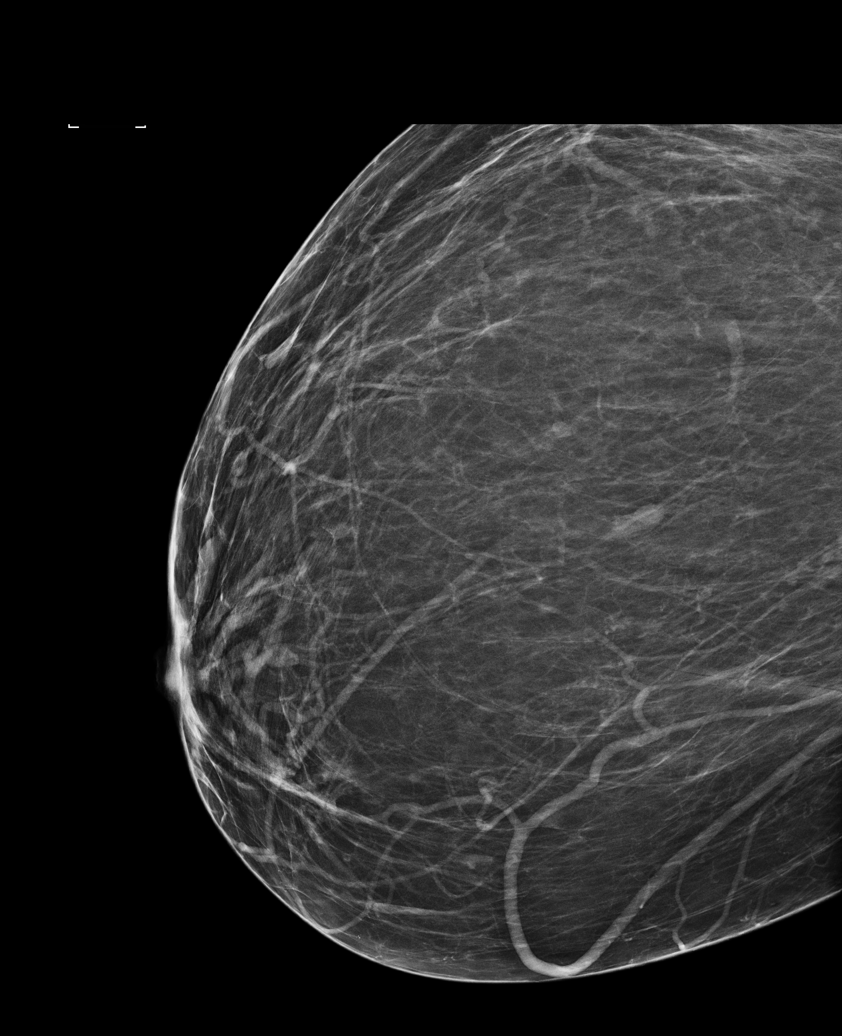

[R MLO (2 of 2)]
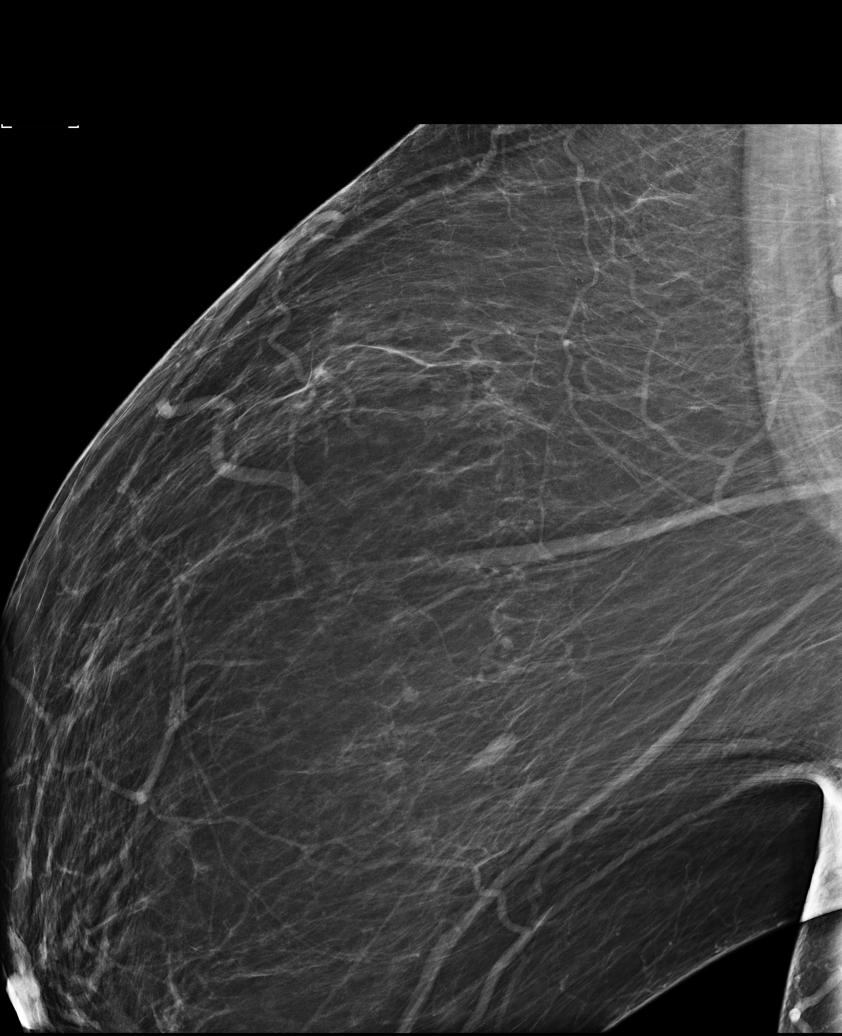

[R CC]
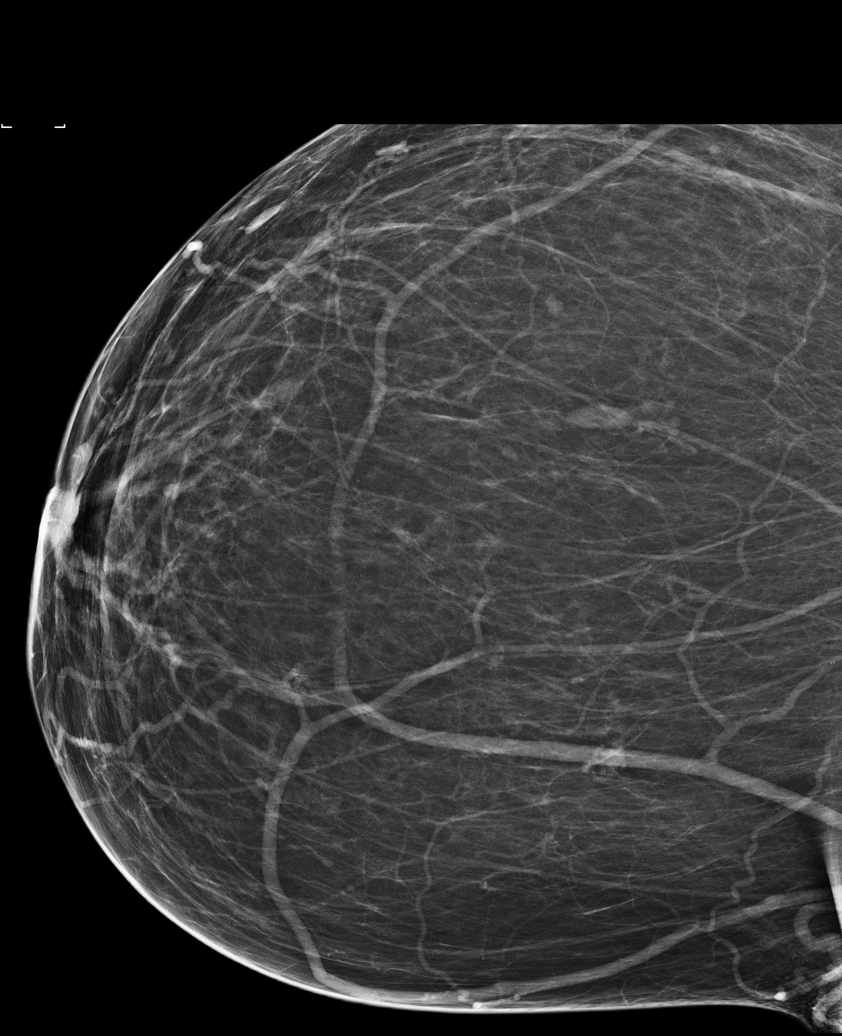

[L MLO (2 of 2)]
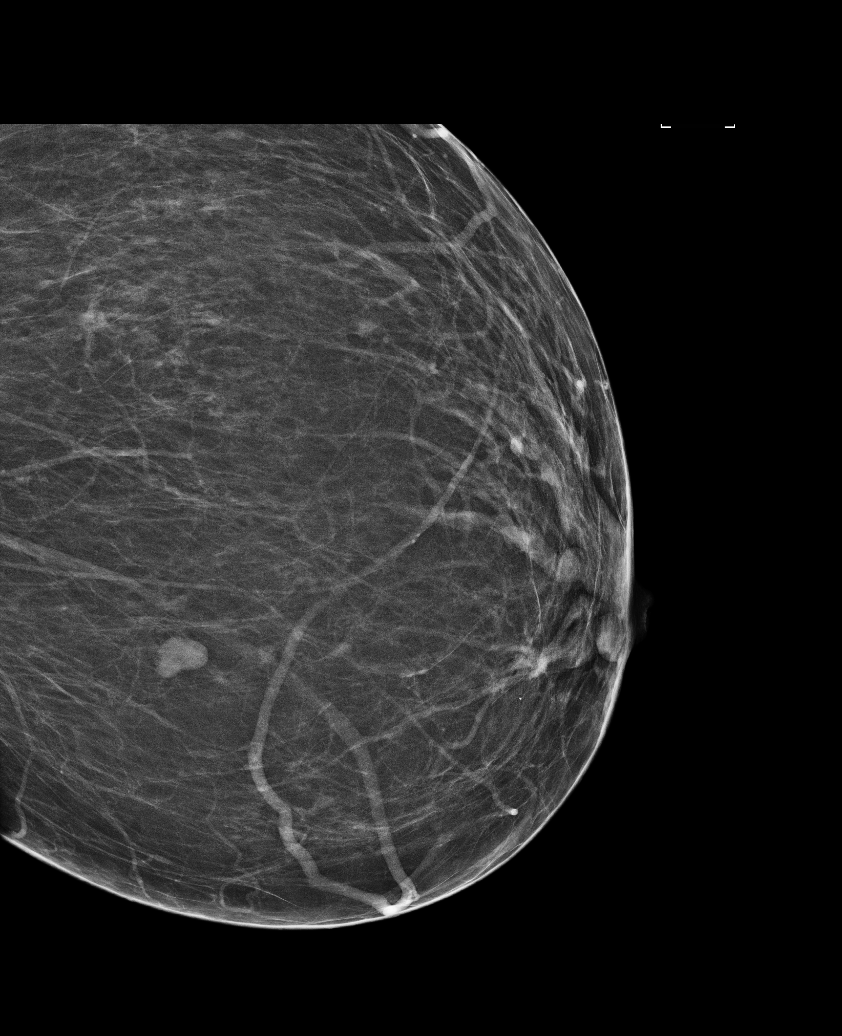

[6 of 6 positions shown; findings below may reference images not displayed]

ACR Breast Density Category b: There are scattered areas of
fibroglandular density.
FINDINGS: There are no findings suspicious for malignancy. Images were
processed with CAD.
IMPRESSION: No mammographic evidence of malignancy. A result letter of this
screening mammogram will be mailed directly to the patient.

RECOMMENDATION:
Screening mammogram in one year. (Code:AS-G-LCT)

BI-RADS CATEGORY  1: Negative.

## 2020-08-16 ENCOUNTER — Other Ambulatory Visit: Payer: Self-pay | Admitting: Family Medicine

## 2020-08-16 ENCOUNTER — Telehealth (HOSPITAL_COMMUNITY): Payer: Self-pay | Admitting: Cardiology

## 2020-08-16 DIAGNOSIS — F32A Depression, unspecified: Secondary | ICD-10-CM

## 2020-08-16 DIAGNOSIS — F419 Anxiety disorder, unspecified: Secondary | ICD-10-CM

## 2020-08-16 NOTE — Telephone Encounter (Signed)
Patient cancelled echo due to she is not feeling well. I called patient and LVm to call the office to reschedule and she called me back and states still not feeling well and will call me back when she is feeling better. Order will be removed from the ECHO WQ and when pt calls back to reschedule we will reinstate the order. Thank you.

## 2020-08-17 ENCOUNTER — Other Ambulatory Visit: Payer: Self-pay | Admitting: Family Medicine

## 2020-08-17 DIAGNOSIS — F32A Depression, unspecified: Secondary | ICD-10-CM

## 2020-09-14 ENCOUNTER — Encounter: Payer: Medicaid Other | Admitting: Internal Medicine

## 2020-09-19 ENCOUNTER — Other Ambulatory Visit: Payer: Self-pay | Admitting: Family Medicine

## 2020-09-19 DIAGNOSIS — F32A Depression, unspecified: Secondary | ICD-10-CM

## 2020-10-24 ENCOUNTER — Other Ambulatory Visit: Payer: Self-pay | Admitting: Family Medicine

## 2020-10-24 DIAGNOSIS — F32A Depression, unspecified: Secondary | ICD-10-CM

## 2020-11-03 ENCOUNTER — Other Ambulatory Visit (HOSPITAL_COMMUNITY): Payer: Medicaid Other

## 2020-11-04 ENCOUNTER — Other Ambulatory Visit: Payer: Self-pay | Admitting: *Deleted

## 2020-11-04 DIAGNOSIS — I5042 Chronic combined systolic (congestive) and diastolic (congestive) heart failure: Secondary | ICD-10-CM

## 2020-11-04 NOTE — Progress Notes (Signed)
echo

## 2020-11-21 ENCOUNTER — Other Ambulatory Visit: Payer: Self-pay

## 2020-11-21 MED ORDER — OMEPRAZOLE 20 MG PO CPDR: DELAYED_RELEASE_CAPSULE | ORAL | 0 refills | Status: DC

## 2020-11-22 ENCOUNTER — Other Ambulatory Visit: Payer: Self-pay | Admitting: Family Medicine

## 2020-11-22 DIAGNOSIS — F32A Depression, unspecified: Secondary | ICD-10-CM

## 2020-12-16 ENCOUNTER — Other Ambulatory Visit (HOSPITAL_COMMUNITY): Payer: Medicaid Other

## 2020-12-16 ENCOUNTER — Other Ambulatory Visit: Payer: Self-pay | Admitting: Family Medicine

## 2020-12-16 ENCOUNTER — Encounter (HOSPITAL_COMMUNITY): Payer: Self-pay | Admitting: Cardiology

## 2020-12-16 DIAGNOSIS — F419 Anxiety disorder, unspecified: Secondary | ICD-10-CM

## 2020-12-16 NOTE — Telephone Encounter (Signed)
Please have patient schedule an appointment for follow up prior to me sending in a refill for her valium. Thank you!   Patriciaann Clan, DO

## 2020-12-16 NOTE — Telephone Encounter (Signed)
I called patient to schedule and she wanted to let Dr. Higinio Plan know that she recently had a death in the family and will not be able to call and schedule an appointment until after next week. She would like to know if she can have enough medication to last until her appointment. She will be out of the medication this weekend.

## 2020-12-16 NOTE — Progress Notes (Unsigned)
Patient ID: Deborah [name], female   DOB: March 20, 1966, 55 y.o.   MRN: [id number]  Verified appointment "no show" status with Elmo Putt at 0:21RZ.

## 2020-12-19 ENCOUNTER — Encounter (HOSPITAL_COMMUNITY): Payer: Self-pay | Admitting: Cardiology

## 2021-01-02 ENCOUNTER — Telehealth (HOSPITAL_COMMUNITY): Payer: Self-pay | Admitting: Cardiology

## 2021-01-02 NOTE — Telephone Encounter (Signed)
Just an FYI. We have made several attempts to contact this patient including sending a letter to schedule or reschedule their echocardiogram. We will be removing the patient from the echo [place].   12/19/20 MAILED LETTER [facility]  12/16/20 pt NO SHOWED    Thank you

## 2021-01-06 ENCOUNTER — Telehealth: Payer: Self-pay

## 2021-01-06 ENCOUNTER — Ambulatory Visit: Payer: Medicaid Other | Admitting: Family Medicine

## 2021-01-06 NOTE — Telephone Encounter (Signed)
Patient calls nurse line to cancel apt with PCP today. Patient reports she is feeling under the weather with nausea and mild congestion. Patient will call back next week when she is feeling better to schedule with PCP.

## 2021-01-24 ENCOUNTER — Other Ambulatory Visit: Payer: Self-pay | Admitting: Family Medicine

## 2021-01-24 ENCOUNTER — Telehealth: Payer: Self-pay | Admitting: Family Medicine

## 2021-01-24 ENCOUNTER — Other Ambulatory Visit: Payer: Self-pay

## 2021-01-24 DIAGNOSIS — F419 Anxiety disorder, unspecified: Secondary | ICD-10-CM

## 2021-01-24 DIAGNOSIS — F32A Depression, unspecified: Secondary | ICD-10-CM

## 2021-01-24 MED ORDER — DIAZEPAM 5 MG PO TABS: ORAL_TABLET | ORAL | 0 refills | Status: DC

## 2021-01-24 NOTE — Telephone Encounter (Signed)
Last refill was placed as 20 tablets as to get her through until her appointment to follow-up for this medication, however it was canceled due to her feeling under the weather.  I have been placing 30 tablets with no refills with the request that she follow-up for continued medication check-in.  She has not been seen since 06/2020.   She should ideally be following up every 3 months while she continues to take this medication and discuss possible tapering with alternative medication that would be more appropriate long-term for anxiety control.  I will refill her medication for another 30 tablets as I see she has an appointment with Dr. Quentin Cornwall on 3/11.  However will hold off on refills until she follows up.  Patriciaann Clan, DO

## 2021-01-24 NOTE — Telephone Encounter (Signed)
Patient is calling needing Diazepam 5mg  states it is supposed to be 30 tablets and not 20 tablets. Patient is want to have refills on it so she don't have to keep calling every month. Please advise. Thanks! Patient is needing meds asap. Thanks!

## 2021-02-03 ENCOUNTER — Ambulatory Visit: Payer: Medicaid Other | Admitting: Family Medicine

## 2021-02-03 NOTE — Patient Instructions (Incomplete)

## 2021-02-03 NOTE — Progress Notes (Deleted)
    SUBJECTIVE:   CHIEF COMPLAINT / HPI: anxiety f/u   Anxiety  Patient currently taking valium to help with anxiety. She reports ***   PERTINENT  PMH / PSH:  Anxiety   OBJECTIVE:   LMP 09/20/2012   Mental status exam Appearance:*** Speech: Normal rate of speech, normal volume without dysarthria, spontaneous Mood: "***" Affect: Congruent Thought content: content is appropriate for situation regarding ***, ***no SI, HI, depressive cognitions, obsessions, ruminations, phobias, ideas of reference, paranoid ideation, magical ideation, delusions Thought process: coherent and logical, appropriate for situation without blocking with appropriate attention to interview  Insight: Patient demonstrates appropriate awareness of illness  Judgment: Judgment is intact Motor activity: no psychomotor retardation, no agitation, abnormal movement, normal gait, no catatonia*** Orientation: Alert & oriented to person, place, time & situation Intellect: average*** Perception: no objective findings of A/V hallucinations SI/HI: patient denies     ASSESSMENT/PLAN:   No problem-specific Assessment & Plan notes found for this encounter.     Deborah Foster, MD [place]   {    This will disappear when note is signed, click to select method of visit    :1}

## 2021-03-24 ENCOUNTER — Telehealth: Payer: Self-pay

## 2021-03-24 DIAGNOSIS — F32A Depression, unspecified: Secondary | ICD-10-CM

## 2021-03-24 MED ORDER — DIAZEPAM 5 MG PO TABS: ORAL_TABLET | ORAL | 0 refills | Status: DC

## 2021-03-24 NOTE — Telephone Encounter (Signed)
Please have this patient schedule an anxiety follow-up visit.  She has not been seen since 06/2020 and recommend 3 month follow-up to discuss her benzodiazepine chronic prescription.  She has canceled/no showed 2 appointments in February/March.  Will send in refill to last her until appointment when scheduled.  Patriciaann Clan, DO

## 2021-03-24 NOTE — Telephone Encounter (Signed)
Spoke to patient and she states that she tried earlier today to get an appointment for refill of her Valium.  The first available is 04/05/2021.  There are four slots open at this time.  None of these times are conducive for patient.  She has had a lot of deaths among her family and friends which is one of the reasons why she has not been in.  She does not work but has to consider transportation with people who do work.  Patient will call by the end of next week to check and see if there are appointments that are sooner that will work for her.  Patient states that she needs her RX filled to help with stressors in her life right now.  Dr. Higinio Plan is there an option for a virtual visit?  Please advise.  Ozella Almond, [place]

## 2021-03-24 NOTE — Telephone Encounter (Signed)
I am happy to do a virtual appointment if needed.  She can always use [facility] to bring her to and from this appointment as well. [facility] [employer] [phone]. Thank you for calling.   Patriciaann Clan, DO

## 2021-04-23 ENCOUNTER — Emergency Department (HOSPITAL_COMMUNITY): Payer: Medicaid Other

## 2021-04-23 ENCOUNTER — Emergency Department (HOSPITAL_COMMUNITY)
Admission: EM | Admit: 2021-04-23 | Discharge: 2021-04-23 | Disposition: A | Discharge status: Home / Self Care | Payer: Medicaid Other | Attending: Emergency Medicine | Admitting: Emergency Medicine

## 2021-04-23 ENCOUNTER — Encounter (HOSPITAL_COMMUNITY): Payer: Self-pay

## 2021-04-23 ENCOUNTER — Other Ambulatory Visit: Payer: Self-pay

## 2021-04-23 DIAGNOSIS — R2241 Localized swelling, mass and lump, right lower limb: Secondary | ICD-10-CM | POA: Insufficient documentation

## 2021-04-23 DIAGNOSIS — M7731 Calcaneal spur, right foot: Secondary | ICD-10-CM | POA: Diagnosis not present

## 2021-04-23 DIAGNOSIS — Z8542 Personal history of malignant neoplasm of other parts of uterus: Secondary | ICD-10-CM | POA: Diagnosis not present

## 2021-04-23 DIAGNOSIS — M79671 Pain in right foot: Secondary | ICD-10-CM | POA: Insufficient documentation

## 2021-04-23 DIAGNOSIS — R079 Chest pain, unspecified: Secondary | ICD-10-CM | POA: Diagnosis not present

## 2021-04-23 DIAGNOSIS — I5042 Chronic combined systolic (congestive) and diastolic (congestive) heart failure: Secondary | ICD-10-CM | POA: Insufficient documentation

## 2021-04-23 DIAGNOSIS — L84 Corns and callosities: Secondary | ICD-10-CM | POA: Insufficient documentation

## 2021-04-23 DIAGNOSIS — Z923 Personal history of irradiation: Secondary | ICD-10-CM | POA: Diagnosis not present

## 2021-04-23 DIAGNOSIS — R0789 Other chest pain: Secondary | ICD-10-CM | POA: Diagnosis not present

## 2021-04-23 DIAGNOSIS — I110 Hypertensive heart disease with heart failure: Secondary | ICD-10-CM | POA: Diagnosis not present

## 2021-04-23 DIAGNOSIS — M7989 Other specified soft tissue disorders: Secondary | ICD-10-CM | POA: Diagnosis not present

## 2021-04-23 LAB — TROPONIN I (HIGH SENSITIVITY)
Troponin I (High Sensitivity): 2 ng/L (ref <18)
Troponin I (High Sensitivity): <2 ng/L (ref <18)

## 2021-04-23 LAB — BASIC METABOLIC PANEL
Anion gap: 10 (ref 5–15)
BUN: 11 mg/dL (ref 6–20)
CO2: 27 mmol/L (ref 22–32)
Calcium: 8.7 mg/dL — ABNORMAL LOW (ref 8.9–10.3)
Chloride: 101 mmol/L (ref 98–111)
Creatinine, Ser: 0.93 mg/dL (ref 0.44–1.00)
GFR, Estimated: >60 mL/min (ref >60)
Glucose, Bld: 143 mg/dL — ABNORMAL HIGH (ref 70–99)
Potassium: 3 mmol/L — ABNORMAL LOW (ref 3.5–5.1)
Sodium: 138 mmol/L (ref 135–145)

## 2021-04-23 LAB — CBC
HCT: 43.2 % (ref 36.0–46.0)
Hemoglobin: 13.6 g/dL (ref 12.0–15.0)
MCH: 27.1 pg (ref 26.0–34.0)
MCHC: 31.5 g/dL (ref 30.0–36.0)
MCV: 86.2 fL (ref 80.0–100.0)
Platelets: 349 10*3/uL (ref 150–400)
RBC: 5.01 MIL/uL (ref 3.87–5.11)
RDW: 14.6 % (ref 11.5–15.5)
WBC: 6.3 10*3/uL (ref 4.0–10.5)
nRBC: 0 % (ref 0.0–0.2)

## 2021-04-23 LAB — D-DIMER, QUANTITATIVE: D-Dimer, Quant: 0.52 ug/mL-FEU — ABNORMAL HIGH (ref 0.00–0.50)

## 2021-04-23 MED ORDER — KETOROLAC TROMETHAMINE 30 MG/ML IJ SOLN: 30.0000 mg | Freq: Once | INTRAMUSCULAR | Status: DC

## 2021-04-23 MED ORDER — ALUM & MAG HYDROXIDE-SIMETH 200-200-20 MG/5ML PO SUSP
30.0000 mL | Freq: Once | ORAL | Status: AC
  Administered 2021-04-23: 30 mL via ORAL
  Filled 2021-04-23: qty 30

## 2021-04-23 MED ORDER — KETOROLAC TROMETHAMINE 30 MG/ML IJ SOLN
30.0000 mg | Freq: Once | INTRAMUSCULAR | Status: AC
  Administered 2021-04-23: 30 mg via INTRAMUSCULAR

## 2021-04-23 MED ORDER — POTASSIUM CHLORIDE CRYS ER 20 MEQ PO TBCR
40.0000 meq | EXTENDED_RELEASE_TABLET | Freq: Once | ORAL | Status: AC
  Administered 2021-04-23: 40 meq via ORAL
  Filled 2021-04-23: qty 2

## 2021-04-23 MED ORDER — ACETAMINOPHEN 325 MG PO TABS
650.0000 mg | ORAL_TABLET | Freq: Once | ORAL | Status: AC
  Administered 2021-04-23: 650 mg via ORAL
  Filled 2021-04-23: qty 2

## 2021-04-23 MED ORDER — KETOROLAC TROMETHAMINE 30 MG/ML IJ SOLN
30.0000 mg | Freq: Once | INTRAMUSCULAR | Status: DC
  Filled 2021-04-23: qty 1

## 2021-04-23 MED ORDER — DICLOFENAC SODIUM 1 % EX GEL: 2.0000 g | Freq: Four times a day (QID) | CUTANEOUS | 0 refills | Status: DC

## 2021-04-23 MED ORDER — DOXYCYCLINE HYCLATE 100 MG PO CAPS: 100.0000 mg | ORAL_CAPSULE | Freq: Two times a day (BID) | ORAL | 0 refills | Status: AC

## 2021-04-23 NOTE — ED Triage Notes (Signed)
Patient c/o intermittent left chest pain since 0400 today. Patient also reports right foot pain and swelling x 3-4 weeks.

## 2021-04-23 NOTE — ED Provider Notes (Signed)
[facility] DEPT Provider Note   CSN: [id number] Arrival date & time: 04/23/21  1345     History Chief Complaint  Patient presents with  . Chest Pain  . Foot Pain    Deborah [name] is a 55 y.o. female.  HPI    Pt is a 55 y/o female with a ho anemia, anxiety, arthritis, blood in stool, depression, endometrial cancer, GERD, hyperlipidemia, hypertension, obesity who presents to the ED today for eval of chest pain that started this morning. Pain located to the left side of the chest.  Pain started this morning.  Pain has been intermittent and she describes it as aching.  It is not associated with exertion.  Pain does not radiate.  At present her pain is rated 0 out of 10 but at its worst she states that it was severe.  She has no associated shortness of breath, nausea, diaphoresis, pleuritic pain.  She further reports that she has had pain to the right foot for few days.  She states she has a known history of arthritis in the foot but recently it has become swollen and more painful.   Past Medical History:  Diagnosis Date  . Anemia   . Anxiety   . Arthritis    ,Knees, back  . Blood in stool 08/28/2012  . Congenital birth defect    Right hand  . Depression   . Educated about COVID-19 virus infection 04/16/2019  . Endometrial cancer ([place])   . 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 GASTROESOPHAGEAL REFLUX, NO ESOPHAGITIS 01/23/2007   Qualifier: Diagnosis of  By: Herma Ard    . HLD (hyperlipidemia)    borderline  . Hx of echocardiogram    Echo (9/15): EF 50-55%, normal wall motion, grade 2 diastolic dysfunction, mild LAE  . Hx of radiation therapy 04/07/13- 05/14/13   pelvis 45 gray 25 fx  . Hypertension   . Obesity   . Swelling    ANKLES - TAKES LASIX  . Wrist pain 08/28/2012    Patient Active Problem List   Diagnosis Date Noted  . Abdominal bloating 07/11/2020  . 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 Essential hypertension 05/17/2020  .  Healthcare maintenance 01/02/2020  . Chronic combined systolic and diastolic CHF, NYHA class 2 ([place]) 04/16/2019  . Sleep apnea 04/16/2019  . Ventricular dysfunction of heart 09/19/2017  . Hx of cholecystectomy 02/19/2015  . Morbid obesity with BMI of 50.0-59.9, adult ([place]) 02/19/2015  . History of endometrial cancer 10/08/2012  . Hot flashes 07/18/2011  . Anxiety and depression 07/18/2011  . LOSS, SENSORINEURAL HEARING, UNILAT 07/08/2007  . BACK PAIN, LUMBAR 06/13/2007  . Primary osteoarthritis of both knees 01/23/2007    Past Surgical History:  Procedure Laterality Date  . ABDOMINAL HYSTERECTOMY  10/2011   complete  . CHOLECYSTECTOMY N/A 07/08/2015   Procedure: LAPAROSCOPIC CHOLECYSTECTOMY WITH INTRAOPERATIVE CHOLANGIOGRAM;  Surgeon: Judeth Horn, MD;  Location: [facility];  Service: General;  Laterality: N/A;  . HIATAL HERNIA REPAIR    . REPAIR VAGINAL CUFF  10/07/2012   Procedure: REPAIR VAGINAL CUFF;  Surgeon: [name], MD PHD;  Location: [facility];  Service: Gynecology;;  . ROBOTIC ASSISTED TOTAL HYSTERECTOMY WITH BILATERAL SALPINGO OOPHERECTOMY  10/07/2012   Procedure: ROBOTIC ASSISTED TOTAL HYSTERECTOMY WITH BILATERAL SALPINGO OOPHORECTOMY;  Surgeon: [name], MD PHD;  Location: [facility];  Service: Gynecology;  Laterality: Bilateral;     OB History    Gravida  2   Para  1  Term  0   Preterm  1   AB  1   Living  1     SAB  1   IAB  0   Ectopic  0   Multiple  0   Live Births              Family History  Problem Relation Age of Onset  . Diabetes Mother   . Pancreatic cancer Maternal Grandfather        or liver cancer  . Hypertension Maternal Grandfather   . Cancer Maternal Grandfather        pancreatic  . Hypertension Maternal Grandmother   . Colon polyps Maternal Aunt   . Cancer Maternal Aunt        lung  . Colon cancer Cousin   . Cancer Cousin        lung  . Lung cancer Maternal Aunt   . Stroke Cousin   . Heart attack Neg Hx      Social History   Tobacco Use  . Smoking status: Never Smoker  . Smokeless tobacco: Never Used  Vaping Use  . Vaping Use: Never used  Substance Use Topics  . Alcohol use: No    Comment: rare  . Drug use: No    Home Medications Prior to Admission medications   Medication Sig Start Date End Date Taking? Authorizing Provider  diclofenac Sodium (VOLTAREN) 1 % GEL Apply 2 g topically 4 (four) times daily. 04/23/21  Yes Chantal Worthey S, PA-C  doxycycline (VIBRAMYCIN) 100 MG capsule Take 1 capsule (100 mg total) by mouth 2 (two) times daily for 7 days. 04/23/21 04/30/21 Yes Cheyan Frees S, PA-C  acetaminophen (TYLENOL) 500 MG tablet Take 1,000 mg by mouth every 6 (six) hours as needed for moderate pain (pain).    [provider]  carvedilol (COREG) 3.125 MG tablet TAKE 1 TABLET EVERY MORNING& 2 TABLETS EVERY EVENING 08/03/20   Minus Breeding, MD  diazepam (VALIUM) 5 MG tablet TAKE 1 TABLET(5 MG) BY MOUTH EVERY 12 HOURS AS NEEDED FOR ANXIETY. Will not add refills until follow up. 03/24/21   Patriciaann Clan, DO  diclofenac sodium (VOLTAREN) 1 % GEL Apply 4 g topically 4 (four) times daily. 10/02/19   Fondaw, Kathleene Hazel, PA  fluticasone (FLONASE) 50 MCG/ACT nasal spray Place 2 sprays into both nostrils daily. 09/23/17   Smiley Houseman, MD  ibuprofen (ADVIL,MOTRIN) 800 MG tablet Take 1 tablet (800 mg total) by mouth every 8 (eight) hours as needed. 09/18/17   Smiley Houseman, MD  omeprazole (PRILOSEC) 20 MG capsule TAKE 1 CAPSULE(20 MG) BY MOUTH DAILY 11/21/20   Patriciaann Clan, DO  polyethylene glycol powder (GLYCOLAX/MIRALAX) 17 GM/SCOOP powder Take 17 g by mouth daily as needed. 07/07/20   Patriciaann Clan, DO  sertraline (ZOLOFT) 50 MG tablet Take 1 tablet (50 mg total) by mouth daily. Start with 1/2 tablet for a few days. 07/07/20   Patriciaann Clan, DO    Allergies    Lactose intolerance (gi)  Review of Systems   Review of Systems  Constitutional: Negative for  fever.  HENT: Negative for ear pain and sore throat.   Eyes: Negative for visual disturbance.  Respiratory: Negative for cough and shortness of breath.   Cardiovascular: Positive for chest pain.  Gastrointestinal: Negative for abdominal pain, constipation, diarrhea, nausea and vomiting.  Genitourinary: Negative for dysuria and hematuria.  Musculoskeletal: Negative for back pain.       Right  foot pain/swelling  Skin: Negative for rash.  Neurological: Negative for headaches.  All other systems reviewed and are negative.   Physical Exam Updated Vital Signs BP 113/64   Pulse 80   Temp 98.3 F (36.8 C) (Oral)   Resp (!) 25   Ht 5\' 6"  (1.676 m)   Wt 135.2 kg   LMP 09/20/2012   SpO2 100%   BMI 48.10 kg/m   Physical Exam Vitals and nursing note reviewed.  Constitutional:      General: She is not in acute distress.    Appearance: She is well-developed.  HENT:     Head: Normocephalic and atraumatic.  Eyes:     Conjunctiva/sclera: Conjunctivae normal.  Cardiovascular:     Rate and Rhythm: Normal rate and regular rhythm.     Pulses:          Dorsalis pedis pulses are 2+ on the right side and 2+ on the left side.     Heart sounds: Normal heart sounds. No murmur heard.   Pulmonary:     Effort: Pulmonary effort is normal. No respiratory distress.     Breath sounds: Decreased breath sounds present.  Abdominal:     Palpations: Abdomen is soft.     Tenderness: There is no abdominal tenderness.  Musculoskeletal:     Cervical back: Neck supple.     Right lower leg: No tenderness. No edema.     Left lower leg: No tenderness. No edema.     Comments: TTP and swelling noted to the right lateral foot, multiple callouses noted to the right foot. There is some mild warmth to the lateral foot but there is no obvious induration or fluctuance.  Skin:    General: Skin is warm and dry.  Neurological:     Mental Status: She is alert.     ED Results / Procedures / Treatments   Labs (all  labs ordered are listed, but only abnormal results are displayed) Labs Reviewed  BASIC METABOLIC PANEL - Abnormal; Notable for the following components:      Result Value   Potassium 3.0 (*)    Glucose, Bld 143 (*)    Calcium 8.7 (*)    All other components within normal limits  D-DIMER, QUANTITATIVE - Abnormal; Notable for the following components:   D-Dimer, Quant 0.52 (*)    All other components within normal limits  CBC  TROPONIN I (HIGH SENSITIVITY)  TROPONIN I (HIGH SENSITIVITY)    EKG EKG Interpretation  Date/Time:  Sunday Apr 23 2021 13:57:35 EDT Ventricular Rate:  98 PR Interval:  134 QRS Duration: 109 QT Interval:  352 QTC Calculation: 450 R Axis:   7 Text Interpretation: Sinus tachycardia Atrial premature complex Low voltage, precordial leads Baseline wander in lead(s) I II aVR V2 12 Lead; Mason-Likar Since last tracing rate faster Confirmed by Dorie Rank [id number]) on 04/23/2021 2:12:19 PM   Radiology DG Chest 2 View  Result Date: 04/23/2021 CLINICAL DATA:  55 year old female with chest pain. EXAM: CHEST - 2 VIEW COMPARISON:  06/16/2015 FINDINGS: The mediastinal contours are within normal limits. No cardiomegaly. The lungs are clear bilaterally without evidence of focal consolidation, pleural effusion, or pneumothorax. No acute osseous abnormality. IMPRESSION: No acute cardiopulmonary process. Electronically Signed   By: Ruthann Cancer MD   On: 04/23/2021 14:55   DG Foot Complete Right  Result Date: 04/23/2021 CLINICAL DATA:  Right foot pain and swelling for 3-4 weeks. EXAM: RIGHT FOOT COMPLETE - 3+ VIEW COMPARISON:  10/02/2019  FINDINGS: No fracture or bone lesion. Joints normally spaced and aligned.  No arthropathic changes. Moderate plantar and small dorsal calcaneal spurs. IMPRESSION: 1. No fracture, bone lesion or joint abnormality. 2. Calcaneal spurs. Electronically Signed   By: Lajean Manes M.D.   On: 04/23/2021 14:59    Procedures Procedures  Medications  Ordered in ED Medications  acetaminophen (TYLENOL) tablet 650 mg (650 mg Oral Given 04/23/21 1633)  alum & mag hydroxide-simeth (MAALOX/MYLANTA) 200-200-20 MG/5ML suspension 30 mL (30 mLs Oral Given 04/23/21 1633)  potassium chloride SA (KLOR-CON) CR tablet 40 mEq (40 mEq Oral Given 04/23/21 1757)  ketorolac (TORADOL) 30 MG/ML injection 30 mg (30 mg Intramuscular Given 04/23/21 1800)    ED Course  I have reviewed the triage vital signs and the nursing notes.  Pertinent labs & imaging results that were available during my care of the patient were reviewed by me and considered in my medical decision making (see chart for details).    MDM Rules/Calculators/A&P                          55 y/o f presenting for eval of chest pain and right foot pain  Reviewed/interpreted labs CBC unremarkable BMP with mild hypokalemia which was replaced in the ED, otherwise reassuring Trop neg x2 - low suspicion for acs Ddimer - age adjusted ddimer is negative therefor low suspicion for vte  EKG sinus tachycardia, pac, low voltage in the precordial leads  CXR - No acute cardiopulmonary process Xray right foot -  1. No fracture, bone lesion or joint abnormality. 2. Calcaneal spurs.  Given the right foot ttp and warmth noted near her callous, I will tx with doxy for a possible early cellulitis. I will also with voltaren gel for pain. Pt was fitted with post op boot in the ED as well for comfort. With regards to her cp. Her cardiac w/u is reassuring. I doubt pe, dissection, esophageal perforation or other emergent cause of sxs. Will have her f/u with pcp in regards to this. Have advised on strict return precautions. She voices understanding of the plan and reasons to return. All questions answered, pt stable for discharge.   Final Clinical Impression(s) / ED Diagnoses Final diagnoses:  Atypical chest pain  Right foot pain    Rx / DC Orders ED Discharge Orders         Ordered    diclofenac Sodium  (VOLTAREN) 1 % GEL  4 times daily        04/23/21 1856    doxycycline (VIBRAMYCIN) 100 MG capsule  2 times daily        04/23/21 1856           Rodney Booze, PA-C 04/23/21 1856    Dorie Rank, MD 04/24/21 1531

## 2021-04-23 NOTE — Progress Notes (Signed)
Orthopedic Tech Progress Note Patient Details:  Deborah [name] 06-Feb-1966 [id number]  Ortho Devices Type of Ortho Device: CAM walker   Post Interventions Patient Tolerated: Well Instructions Provided: Care of device   [name] 04/23/2021, 6:01 PM

## 2021-04-23 NOTE — Discharge Instructions (Signed)
Use to voltaren gel and tylenol as directed for your foot pain. Please follow up with the orthopedic doctor provided for your on your discharge paperwork. I have given you an antibiotic to cover for a potential skin infection that may be developing in this area.   In regards to your chest pain, you did not have any evidence of a heart attack or other emergent cause of symptoms today, however I do suggest that you follow up with your regular doctor in 1 week for reassessment of your symptoms. Have advised that you return to the emergency department for any new or worsening symptoms.

## 2021-04-24 ENCOUNTER — Telehealth: Payer: Self-pay

## 2021-04-24 NOTE — Telephone Encounter (Signed)
Transition Care Management Unsuccessful Follow-up Telephone Call  Date of discharge and from where:  04/23/2021 from [facility]  Attempts:  1st Attempt  Reason for unsuccessful TCM follow-up call:  Left voice message

## 2021-04-25 NOTE — Telephone Encounter (Signed)
Transition Care Management Unsuccessful Follow-up Telephone Call  Date of discharge and from where:  04/23/2021 from [facility]  Attempts:  2nd Attempt  Reason for unsuccessful TCM follow-up call:  Left voice message

## 2021-04-26 NOTE — Telephone Encounter (Signed)
Transition Care Management Unsuccessful Follow-up Telephone Call  Date of discharge and from where:  04/23/2021 from [facility]  Attempts:  3rd Attempt  Reason for unsuccessful TCM follow-up call:  Unable to reach patient

## 2021-05-05 ENCOUNTER — Other Ambulatory Visit: Payer: Self-pay

## 2021-05-05 DIAGNOSIS — F419 Anxiety disorder, unspecified: Secondary | ICD-10-CM

## 2021-05-05 MED ORDER — DIAZEPAM 5 MG PO TABS: ORAL_TABLET | ORAL | 0 refills | Status: DC

## 2021-05-05 NOTE — Telephone Encounter (Signed)
Patient calls nurse line requesting refill of Diazepam. Informed patient that in last refill note that patient would need to be seen prior to receiving additional refills.   Patient has scheduled appointment for 6/23. (provider's first available appointment)  Patient is requesting partial refill until next scheduled appointment. Please advise.   Talbot Grumbling, RN

## 2021-05-05 NOTE — Telephone Encounter (Signed)
I will send a refill to last until 6/23. I will no longer refill this medication after 6/23 unless she is seen.

## 2021-05-08 ENCOUNTER — Other Ambulatory Visit: Payer: Self-pay | Admitting: Family Medicine

## 2021-05-08 ENCOUNTER — Other Ambulatory Visit: Payer: Self-pay | Admitting: *Deleted

## 2021-05-08 DIAGNOSIS — Z1231 Encounter for screening mammogram for malignant neoplasm of breast: Secondary | ICD-10-CM

## 2021-05-09 ENCOUNTER — Telehealth: Payer: Self-pay

## 2021-05-09 NOTE — Telephone Encounter (Signed)
Pt called requested a phone visit with Dr. Higinio Plan anytime after 1:00 pm to discuss her meds. She would like the doctor to call her as soon as she can.

## 2021-05-09 NOTE — Telephone Encounter (Signed)
LVM informing patient that Dr. Higinio Plan would not be able to call her today.  Further instructed patient to call clinic if inquiry is something that needs attending to prior to appointment on 05/18/2021 with Dr. Higinio Plan.  Ozella Almond, [place]

## 2021-05-09 NOTE — Telephone Encounter (Signed)
Did she have a specific question about her medications? She has an appointment with me next week to discuss medications.  I can try to call her tomorrow, but can not today.   Patriciaann Clan, DO

## 2021-05-10 NOTE — Telephone Encounter (Signed)
Called patient with no answer. Left voicemail. Unless she has an acute concern (or needs any medication filled before our appointment), I will discuss medications with her during her visit next week.   Patriciaann Clan, DO

## 2021-05-18 ENCOUNTER — Ambulatory Visit: Payer: Medicaid Other | Admitting: Family Medicine

## 2021-05-18 ENCOUNTER — Ambulatory Visit: Payer: Medicaid Other

## 2021-05-18 NOTE — Progress Notes (Deleted)
    SUBJECTIVE:   CHIEF COMPLAINT / HPI:    PERTINENT  PMH / PSH:   OBJECTIVE:   LMP 09/20/2012   ***  ASSESSMENT/PLAN:   No problem-specific Assessment & Plan notes found for this encounter.     Deborah [name], [place]   {    This will disappear when note is signed, click to select method of visit    :1}

## 2021-05-26 ENCOUNTER — Ambulatory Visit: Payer: Medicaid Other

## 2021-05-26 ENCOUNTER — Ambulatory Visit
Admission: RE | Admit: 2021-05-26 | Discharge: 2021-05-26 | Disposition: A | Discharge status: Home / Self Care | Payer: Medicaid Other | Source: Ambulatory Visit | Attending: *Deleted | Admitting: *Deleted

## 2021-05-26 ENCOUNTER — Other Ambulatory Visit: Payer: Self-pay | Admitting: Family Medicine

## 2021-05-26 ENCOUNTER — Other Ambulatory Visit: Payer: Self-pay

## 2021-05-26 DIAGNOSIS — Z1231 Encounter for screening mammogram for malignant neoplasm of breast: Secondary | ICD-10-CM

## 2021-05-26 DIAGNOSIS — F419 Anxiety disorder, unspecified: Secondary | ICD-10-CM

## 2021-05-30 ENCOUNTER — Other Ambulatory Visit: Payer: Self-pay

## 2021-05-30 DIAGNOSIS — F419 Anxiety disorder, unspecified: Secondary | ICD-10-CM

## 2021-05-30 NOTE — Telephone Encounter (Signed)
Patient was advised by previous provider Dr. Higinio Plan to schedule appointment prior to next refill to discuss anxiety. She is scheduled with me on 7/13 so I will refill enough of the Valium to hold her over until our appointment.

## 2021-05-30 NOTE — Telephone Encounter (Signed)
Patient is calling stating she is needing her Diazepam 5mg . She states she has been out of it. She is stating it was called in Friday the 1st and she is needing it. Please advise. Thanks!

## 2021-05-31 ENCOUNTER — Ambulatory Visit: Payer: Medicaid Other | Admitting: Family Medicine

## 2021-05-31 ENCOUNTER — Telehealth: Payer: Self-pay

## 2021-05-31 NOTE — Progress Notes (Deleted)
Cardiology Office Note   Date:  05/31/2021   ID:  ELWYN LOWDEN, DOB June 18, 1966, MRN [id number]  PCP:  [name], DO  Cardiologist:   Minus Breeding, MD   No chief complaint on file.     History of Present Illness:  Deborah [name] is a 55 y.o. female who presents for follow up of a reduced EF.  She was seen here in 2000 and and did have a stress perfusion study. This demonstrated possibly a reduced ejection fraction of 47%. There was artifact. There was perhaps some hypoperfusion of the anterior septum. Cardiac catheterization was planned but this did not happen. I saw her in 2016 and I ordered dobutamine echocardiogram. This confirmed the ejection fraction was somewhat low at 35%. I titrated meds and in 2016 her EF was low normal (45 - 50%) and again in 2019.     Since I last saw her ***   ***   she has done well.  She has rare palpitations.  She walks to the mailbox in the dumpster and she has no limitations with this. The patient denies any new symptoms such as chest discomfort, neck or arm discomfort. There has been no new shortness of breath, PND or orthopnea. There have been no reported palpitations, presyncope or syncope.     Past Medical History:  Diagnosis Date   Anemia    Anxiety    Arthritis    ,Knees, back   Blood in stool 08/28/2012   Congenital birth defect    Right hand   Depression    Educated about COVID-19 virus infection 04/16/2019   Endometrial cancer ([place])    Far-sighted 08/28/2012   GASTROESOPHAGEAL REFLUX, NO ESOPHAGITIS 01/23/2007   Qualifier: Diagnosis of  By: Herma Ard     HLD (hyperlipidemia)    borderline   Hx of echocardiogram    Echo (9/15): EF 50-55%, normal wall motion, grade 2 diastolic dysfunction, mild LAE   Hx of radiation therapy 04/07/13- 05/14/13   pelvis 45 gray 25 fx   Hypertension    Obesity    Swelling    ANKLES - TAKES LASIX   Wrist pain 08/28/2012    Past Surgical History:  Procedure Laterality Date    ABDOMINAL HYSTERECTOMY  10/2011   complete   CHOLECYSTECTOMY N/A 07/08/2015   Procedure: LAPAROSCOPIC CHOLECYSTECTOMY WITH INTRAOPERATIVE CHOLANGIOGRAM;  Surgeon: Judeth Horn, MD;  Location: [facility];  Service: General;  Laterality: N/A;   [place]  10/07/2012   Procedure: REPAIR VAGINAL CUFF;  Surgeon: [name], MD PHD;  Location: [facility];  Service: Gynecology;;   ROBOTIC ASSISTED TOTAL HYSTERECTOMY WITH BILATERAL SALPINGO OOPHERECTOMY  10/07/2012   Procedure: ROBOTIC ASSISTED TOTAL HYSTERECTOMY WITH BILATERAL SALPINGO OOPHORECTOMY;  Surgeon: [name], MD PHD;  Location: [facility];  Service: Gynecology;  Laterality: Bilateral;     Current Outpatient Medications  Medication Sig Dispense Refill   acetaminophen (TYLENOL) 500 MG tablet Take 1,000 mg by mouth every 6 (six) hours as needed for moderate pain (pain).     carvedilol (COREG) 3.125 MG tablet TAKE 1 TABLET EVERY MORNING& 2 TABLETS EVERY EVENING 270 tablet 2   diazepam (VALIUM) 5 MG tablet TAKE 1 TABLET(5 MG) BY MOUTH EVERY 12 HOURS AS NEEDED FOR ANXIETY. WILL NOT ADD REFILLS UNTIL FOLLOW UP 10 tablet 0   diclofenac sodium (VOLTAREN) 1 % GEL Apply 4 g topically 4 (four) times daily. 150 g 1  diclofenac Sodium (VOLTAREN) 1 % GEL Apply 2 g topically 4 (four) times daily. 100 g 0   fluticasone (FLONASE) 50 MCG/ACT nasal spray Place 2 sprays into both nostrils daily. 16 g 0   ibuprofen (ADVIL,MOTRIN) 800 MG tablet Take 1 tablet (800 mg total) by mouth every 8 (eight) hours as needed. 60 tablet 0   omeprazole (PRILOSEC) 20 MG capsule TAKE 1 CAPSULE(20 MG) BY MOUTH DAILY 90 capsule 0   polyethylene glycol powder (GLYCOLAX/MIRALAX) 17 GM/SCOOP powder Take 17 g by mouth daily as needed. 3350 g 1   sertraline (ZOLOFT) 50 MG tablet Take 1 tablet (50 mg total) by mouth daily. Start with 1/2 tablet for a few days. 30 tablet 1   No current facility-administered medications for this visit.    Allergies:    Lactose intolerance (gi)   ROS:  Please see the history of present illness.   Otherwise, review of systems are positive for ***.   All other systems are reviewed and negative.    PHYSICAL EXAM: VS:  LMP 09/20/2012  , BMI There is no height or weight on file to calculate BMI. GENERAL:  Well appearing NECK:  No jugular venous distention, waveform within normal limits, carotid upstroke brisk and symmetric, no bruits, no thyromegaly LUNGS:  Clear to auscultation bilaterally CHEST:  Unremarkable HEART:  PMI not displaced or sustained,S1 and S2 within normal limits, no S3, no S4, no clicks, no rubs, *** murmurs ABD:  Flat, positive bowel sounds normal in frequency in pitch, no bruits, no rebound, no guarding, no midline pulsatile mass, no hepatomegaly, no splenomegaly EXT:  2 plus pulses throughout, no edema, no cyanosis no clubbing     ***GENERAL:  Well appearing NECK:  No jugular venous distention, waveform within normal limits, carotid upstroke brisk and symmetric, no bruits, no thyromegaly LUNGS:  Clear to auscultation bilaterally CHEST:  Unremarkable HEART:  PMI not displaced or sustained,S1 and S2 within normal limits, no S3, no S4, no clicks, no rubs, no murmurs ABD:  Flat, positive bowel sounds normal in frequency in pitch, no bruits, no rebound, no guarding, no midline pulsatile mass, no hepatomegaly, no splenomegaly EXT:  2 plus pulses throughout, no edema, no cyanosis no clubbing, absent digits congenitally right hand   EKG:  EKG is *** ordered today. The ekg ordered today demonstrates NSR rate ***, axis within normal limits, intervals within normal limits, no acute ST-T wave changes.   Recent Labs: 07/07/2020: ALT 16 04/23/2021: BUN 11; Creatinine, Ser 0.93; Hemoglobin 13.6; Platelets 349; Potassium 3.0; Sodium 138    Lipid Panel    Component Value Date/Time   CHOL 189 07/07/2020 1622   TRIG 153 (H) 07/07/2020 1622   HDL 38 (L) 07/07/2020 1622   CHOLHDL 5.0 (H)  07/07/2020 1622   CHOLHDL 5.1 07/17/2013 0923   VLDL 30 07/17/2013 0923   LDLCALC 124 (H) 07/07/2020 1622   LDLDIRECT 130 (H) 02/14/2012 1424      Wt Readings from Last 3 Encounters:  04/23/21 298 lb (135.2 kg)  07/07/20 300 lb (136.1 kg)  06/01/20 (!) 301 lb 6 oz (136.7 kg)      Other studies Reviewed: Additional studies/ records that were reviewed today include: *** Review of the above records demonstrates:  Please see elsewhere in the note.     ASSESSMENT AND PLAN:  Chronic systolic and diastolic HF:     ***  The patient seems to be euvolemic.  I am going to check an echocardiogram in September as  it has been 1 year.  No change in therapy otherwise.    Hypertension:   Her blood pressure is *** controlled.  She will continue on the meds as listed.   Anemia:   ***  She is to have follow-up with her primary provider.  I do not see a recent CBC.  I will defer management.    Current medicines are reviewed at length with the patient today.  The patient does not have concerns regarding medicines.  The following changes have been made:  ***  Labs/ tests ordered today include: ***  No orders of the defined types were placed in this encounter.    Disposition:   FU with me in ***   Signed, Minus Breeding, MD  05/31/2021 2:35 PM    [facility]

## 2021-05-31 NOTE — Telephone Encounter (Signed)
Pt requested a call back also on mobile #

## 2021-05-31 NOTE — Telephone Encounter (Signed)
Pt called requested some medicine to hold her until her appointment.

## 2021-06-01 ENCOUNTER — Ambulatory Visit: Payer: Medicaid Other | Admitting: Cardiology

## 2021-06-05 ENCOUNTER — Other Ambulatory Visit: Payer: Self-pay | Admitting: Cardiology

## 2021-06-05 ENCOUNTER — Telehealth: Payer: Self-pay | Admitting: Cardiology

## 2021-06-05 NOTE — Telephone Encounter (Signed)
*  STAT* If patient is at the pharmacy, call can be transferred to refill team.   1. Which medications need to be refilled? (please list name of each medication and dose if known) carvedilol (COREG) 3.125 MG tablet  2. Which pharmacy/location (including street and city if local pharmacy) is medication to be sent to? [facility] [phone] - [place], [place]  3. Do they need a 30 day or 90 day supply? 90 day   Patient is out of medication. Patient has an appt 9/23

## 2021-06-07 ENCOUNTER — Ambulatory Visit: Payer: Medicaid Other | Admitting: Family Medicine

## 2021-06-12 ENCOUNTER — Other Ambulatory Visit: Payer: Self-pay | Admitting: Family Medicine

## 2021-06-13 IMAGING — CR DG FOOT COMPLETE 3+V*R*
3 series · 3 of 3 positions shown · non-contrast
Comparison: Ankle radiograph 08/17/2009

CLINICAL DATA: Lateral foot pain, dorsal and lateral pain and
swelling

EXAM:
RIGHT FOOT COMPLETE - 3+ VIEW

[x foot ap right]
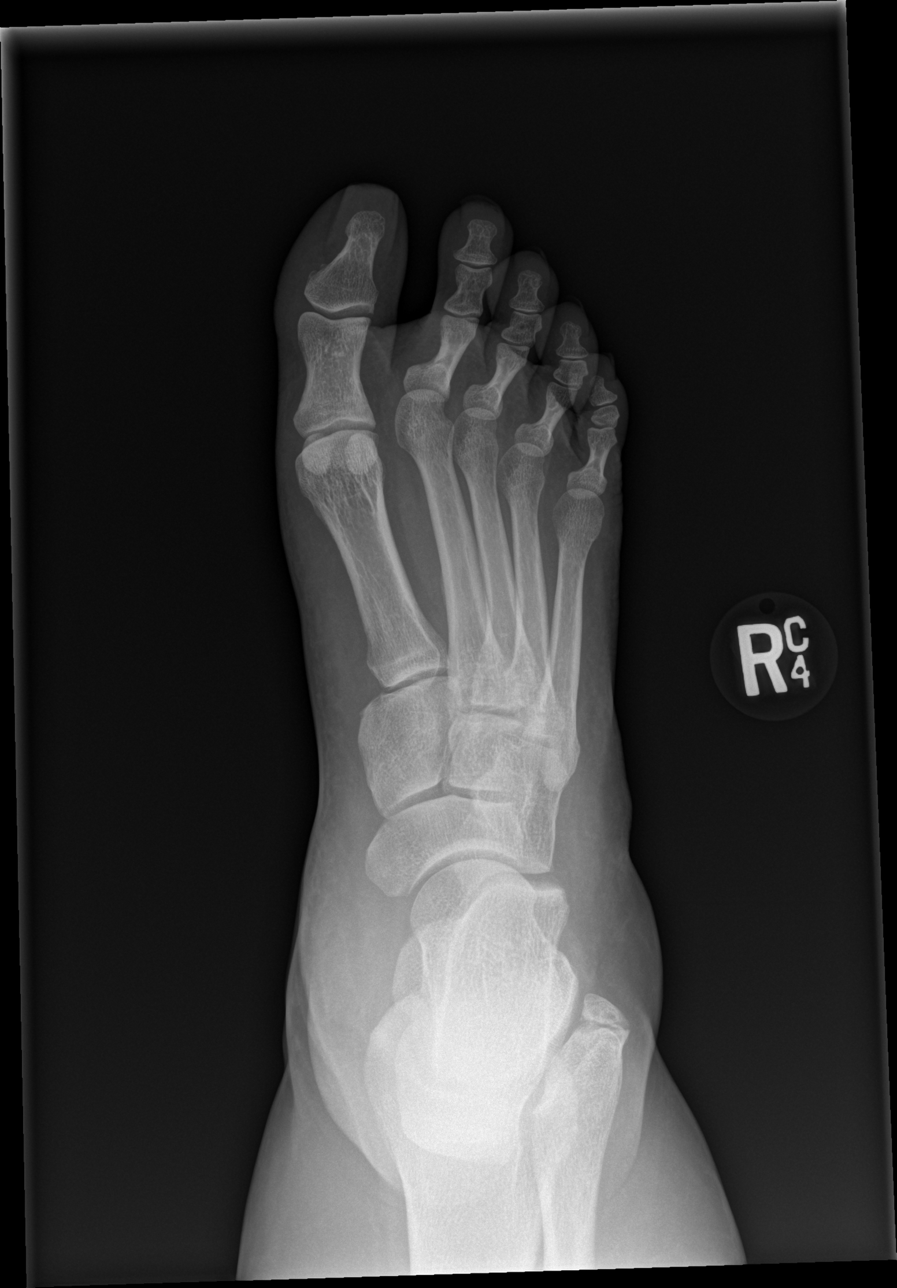

[x foot obl right]
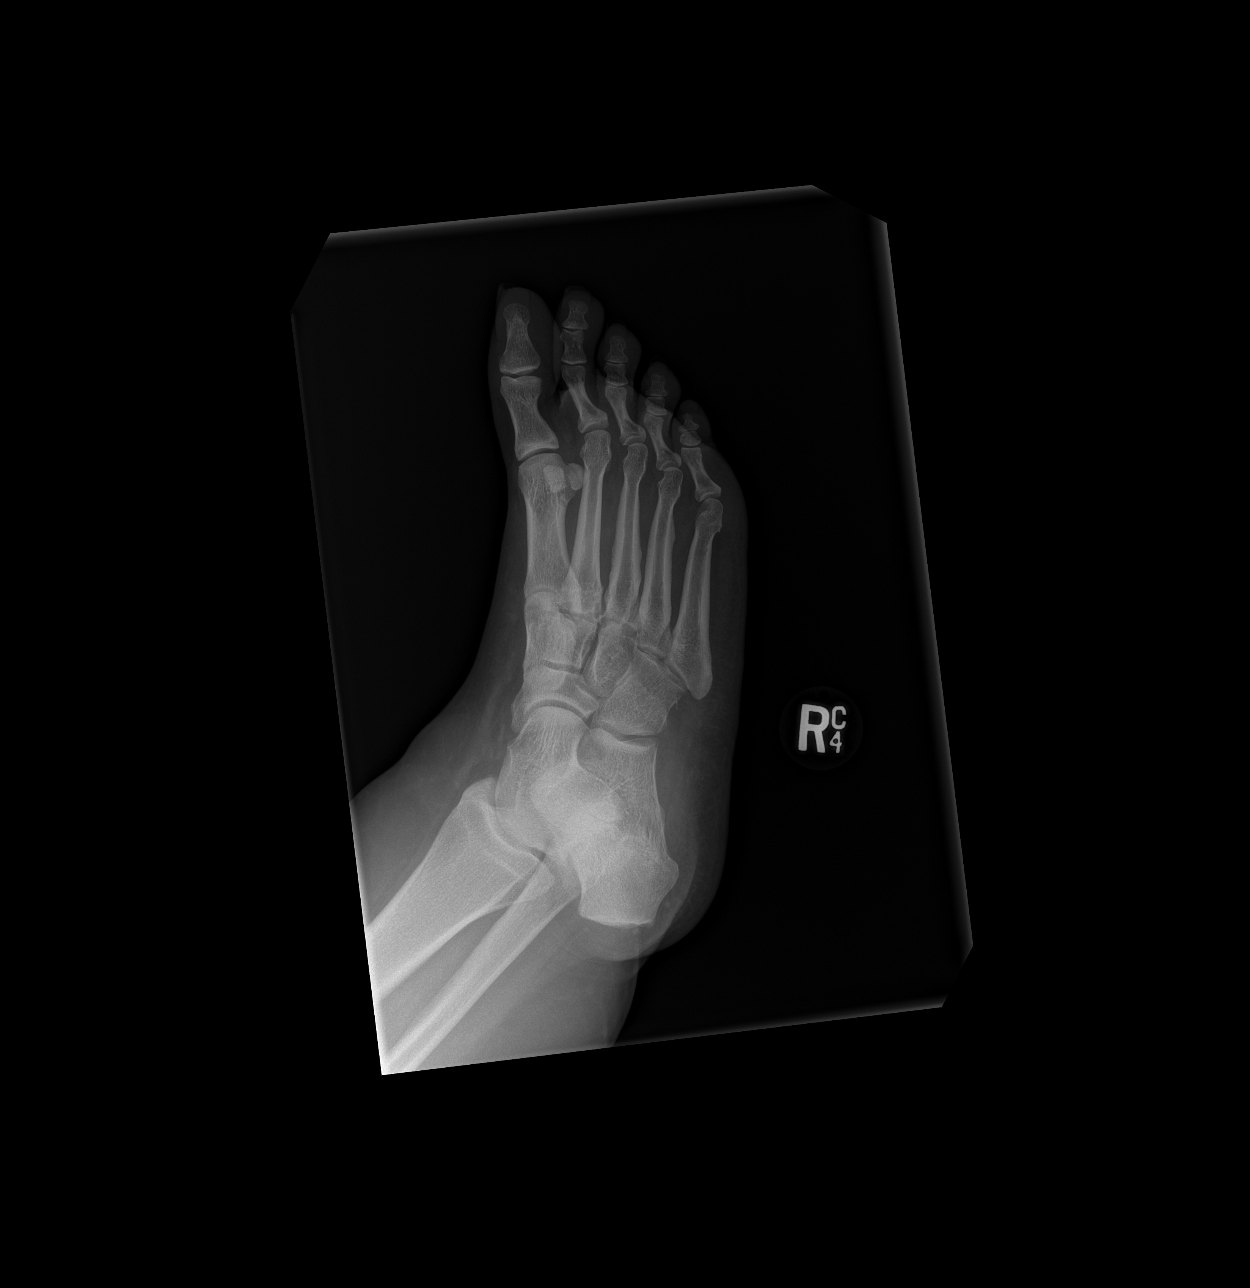

[x foot lat right]
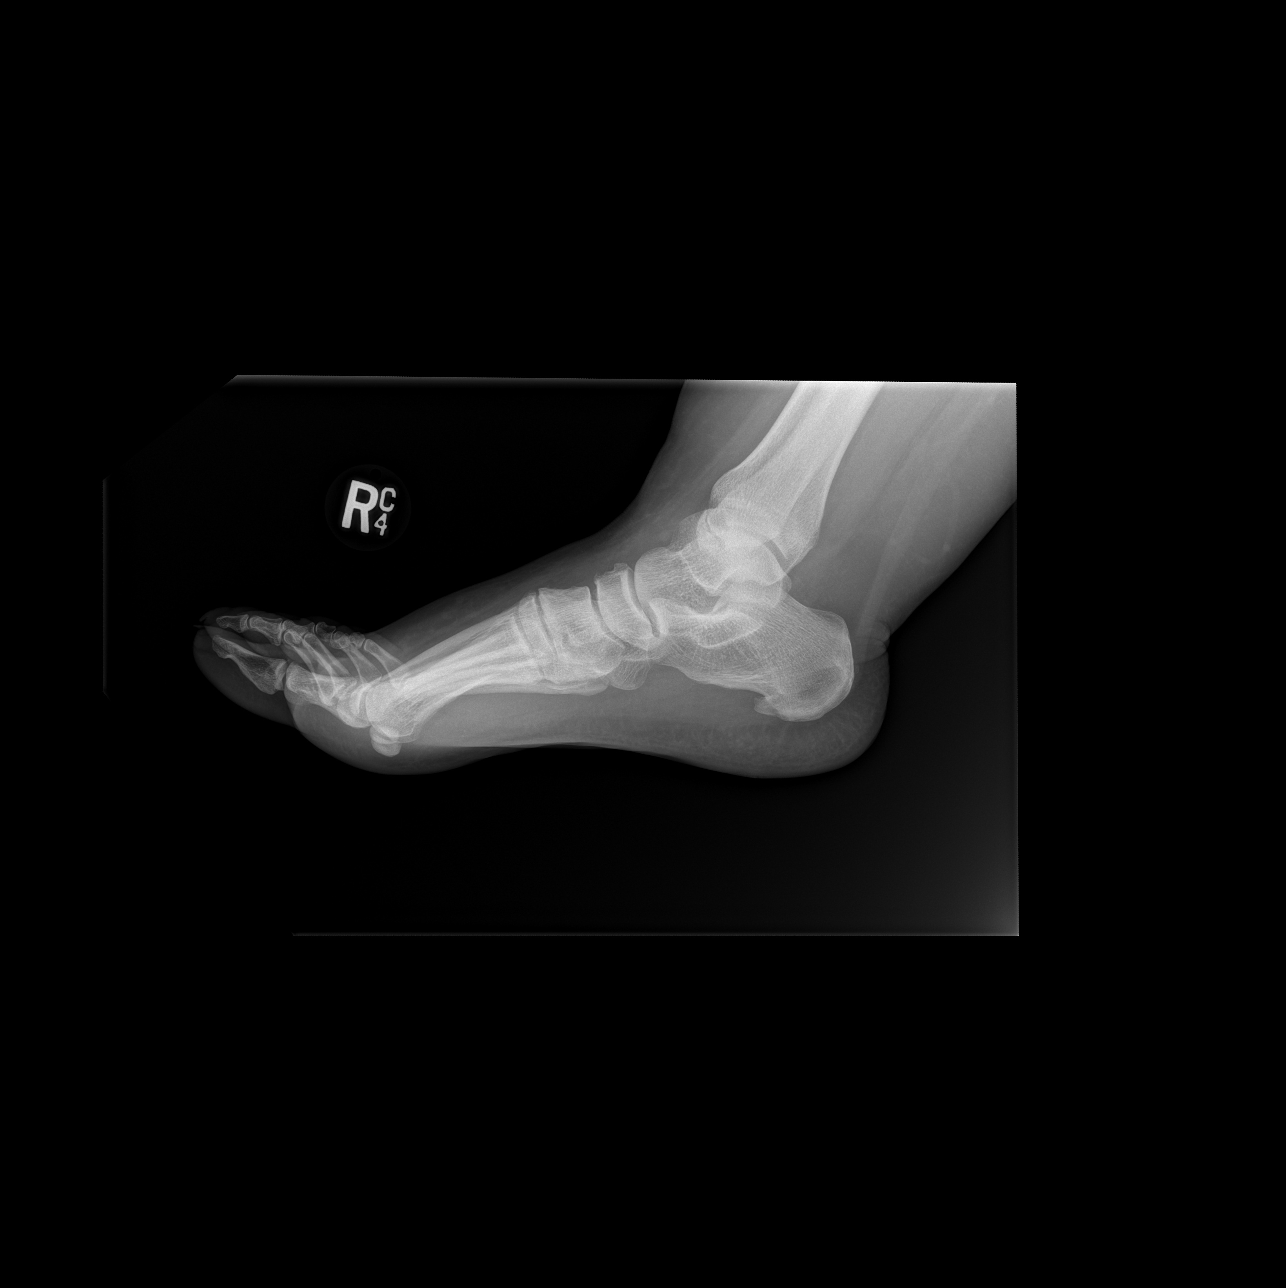

[3 of 3 positions shown; findings below may reference images not displayed]

FINDINGS: Mild lateral soft tissue swelling. No acute osseous abnormality or
suspicious osseous lesion. Corticated fragment adjacent the lateral
malleolus is remote posttraumatic. No suspicious osseous lesions.
Plantar calcaneal spur is noted. Mild hallux valgus. Midfoot and
hindfoot alignment is otherwise grossly preserved though
incompletely assessed on nonweightbearing films.
IMPRESSION: 1. Mild lateral soft tissue swelling. No acute osseous abnormality.
2. Corticated fragment adjacent the lateral malleolus is remote
posttraumatic.

## 2021-06-14 ENCOUNTER — Ambulatory Visit (INDEPENDENT_AMBULATORY_CARE_PROVIDER_SITE_OTHER): Payer: Medicaid Other | Admitting: Family Medicine

## 2021-06-14 ENCOUNTER — Other Ambulatory Visit: Payer: Self-pay

## 2021-06-14 VITALS — BP 115/80 | HR 100 | Ht 65.0 in | Wt 305.8 lb

## 2021-06-14 DIAGNOSIS — F419 Anxiety disorder, unspecified: Secondary | ICD-10-CM

## 2021-06-14 DIAGNOSIS — F32A Depression, unspecified: Secondary | ICD-10-CM

## 2021-06-14 DIAGNOSIS — Z6841 Body Mass Index (BMI) 40.0 and over, adult: Secondary | ICD-10-CM

## 2021-06-14 MED ORDER — SERTRALINE HCL 50 MG PO TABS: 50.0000 mg | ORAL_TABLET | Freq: Every day | ORAL | 1 refills | Status: DC

## 2021-06-14 MED ORDER — FLUTICASONE PROPIONATE 50 MCG/ACT NA SUSP: 2.0000 | Freq: Every day | NASAL | 0 refills | Status: DC

## 2021-06-14 MED ORDER — DIAZEPAM 5 MG PO TABS: ORAL_TABLET | ORAL | 0 refills | Status: DC

## 2021-06-14 NOTE — Patient Instructions (Signed)
It was great seeing you today!  Today we did your physical, checked your electrolytes and sugar, and your lipid panel. I will call you with any abnormal results   I also refilled some of your medication. The goal with the Diazepam would be to decrease the amount on the subsequent refill. Use Zoloft daily and Diazepam only when needed.   Please check-out at the front desk before leaving the clinic. I'd like to see you back in about 3 months for follow up on anxiety, but if you need to be seen earlier than that for any new issues we're happy to fit you in, just give [place] a call!  Visit Reminders: - Stop by the pharmacy to pick up your prescriptions  - Continue to work on your healthy eating habits and incorporating exercise into your daily life.     If you haven't already, sign up for My Chart to have easy access to your labs results, and communication with your primary care physician.  Feel free to call with any questions or concerns at any time, at [phone].   Take care,  Dr. [name] [facility]    Therapy and Counseling Resources Most providers on this list will take Medicaid. Patients with commercial insurance or Medicare should contact their insurance company to get a list of in network providers.  [facility]:Psychiatry and Counseling [address] [place], [place] [address] [place], [place], [place] [address]      [place] [address]  [place], [place] [address] [phone]  [place] [address], [place]  [place], [place] [address]       [phone]     [facility] (virtual only) [phone]  [facility] [address], [place], [place]  [facility]:  [place]. [place] [phone]  [facility]:  [place] [address] [phone]  [facility] (under & uninsured) [address], [place] [phone]    [email or web address]    [place] [address]  [place]    [phone]  [facility] [place]     Phone:  [phone]     [place]  [place] [address]      [place], [place] [address] [place]: [place], [place], [place]   [place] ([place] therapist) [email or web address]  [place] [address]   [place] [address]    [phone]    [facility]   [address] [place],  [place], [place]   [place]  [place]  [facility]  [address] [place], [place]        [phone]  Open Access/Walk In Clinic under & uninsured  [facility]  [address] [place], [place] Crisis [phone]  [facility] the [place],  ([place])   [place], [place]: [phone]) 8:30 - 12; 1 - 2:30  [facility] the [place],  [place], [place]    [phone]):8:30 - 12; 2 - 3PM  [facility]  [address]  [address],  [place]; [phone]):   Mon - Fri 8 AM - 5 PM  [facility] [place]  MWF 12:30 to 3:00 or call to schedule an appointment  [phone]  Specific Provider options Psychology Today  [email or web address] click on find a therapist  enter your zip code left side and select or tailor a therapist for your specific need.   [facility] Provider Directory [email or web address]  (Medicaid)   Follow all drop down to find a provider  [place] or [email or web address] [address], [place], [place] Recovery support and educational   24- Hour Availability:   [facility]  [address] [place], [place] Crisis [phone]  [facility] [employer] [phone]  [place]  [phone]   [place]  [phone] (after hours)  Therapeutic Alternative/Mobile Crisis   [phone]  [place] National Suicide Hotline  [phone] [place])  Call 911 or go to emergency room  [facility]  [phone]);  [place] and [employer]  [phone]); [place], [place], [place], [place], [place], [place], [place]

## 2021-06-14 NOTE — Progress Notes (Signed)
    SUBJECTIVE:   CHIEF COMPLAINT / HPI:   Ms. Brickman is a 55 year old who presents to meet PCP and medications refilled. She endorses feeling more anxious lately with a lot of things going on with death in her family. She has been taking Zoloft and Diazepam, and requests a refill of Diazepam.   She endorses trying to lose weight. States she has been getting more walking in. Sates she doesn't eat a lot of fried foods, eats balanced meals. Discussed getting more vegetables in with each meal   History of endometrial cancer. S/p total hysterectomy and salpingo-oophorectomy in 2013 Referral placed for colonoscopy last August  Diabetes: last A1c 6.4 in aug 2021. Denies episodes of hypoglycemia. Denies polyphagia, polyuria   PERTINENT  PMH / PSH:  Anxiety, depression, History of endometrial cancer obesity, osteoarthritis S/p total hysterectomy and salpingo-oophorectomy in 2013 OBJECTIVE:   BP 115/80   Pulse 100   Ht 5\' 5"  (1.651 m)   Wt (!) 305 lb 12.8 oz (138.7 kg)   LMP 09/20/2012   SpO2 98%   BMI 50.89 kg/m    General: alert, pleasant, NAD CV: RRR no murmurs Resp: CTAB normal WOB GI: soft, non distended Derm: no visible rashes or lesions. No LE edema   ASSESSMENT/PLAN:   No problem-specific Assessment & Plan notes found for this encounter.  Anxiety and depression Due to increased anxiety, requesting a month supply of her valium. Discussed using her Zoloft daily and then Valium for severe anxiety as needed. Filled 30 tablets, but discussed going down next month to 25. Also discussed importance of therapy in conjunction for best results. Provided list of therapists  Hypertension Current BP: 115/80. Well-controlled on Coreg - Continue current regimen - check BMP   DM2 last A1c 6.4 in aug 2021. Asymptomatic  - check A1c   Obesity BMI 50. Patient trying to lose weight. Discussed diet and exercise .  - check lipid panel  - f/u to discuss in greater detail with potential  referral to [facility] if interested  Health maintenance Patient to schedule for colonoscopy  Refilled Flonase for allergies  Follow up in 3 months to check on diabetes, anxiety, weight    [name], [place]

## 2021-06-15 LAB — BASIC METABOLIC PANEL
BUN/Creatinine Ratio: 17 (ref 9–23)
BUN: 15 mg/dL (ref 6–24)
CO2: 20 mmol/L (ref 20–29)
Calcium: 8.5 mg/dL — ABNORMAL LOW (ref 8.7–10.2)
Chloride: 99 mmol/L (ref 96–106)
Creatinine, Ser: 0.9 mg/dL (ref 0.57–1.00)
Glucose: 163 mg/dL — ABNORMAL HIGH (ref 65–99)
Potassium: 3.9 mmol/L (ref 3.5–5.2)
Sodium: 137 mmol/L (ref 134–144)
eGFR: 76 mL/min/{1.73_m2} (ref >59)

## 2021-06-15 LAB — LIPID PANEL
Chol/HDL Ratio: 3.6 ratio (ref 0.0–4.4)
Cholesterol, Total: 165 mg/dL (ref 100–199)
HDL: 46 mg/dL (ref >39)
LDL Chol Calc (NIH): 102 mg/dL — ABNORMAL HIGH (ref 0–99)
Triglycerides: 91 mg/dL (ref 0–149)
VLDL Cholesterol Cal: 17 mg/dL (ref 5–40)

## 2021-06-15 LAB — HEMOGLOBIN A1C
Est. average glucose Bld gHb Est-mCnc: 174 mg/dL
Hgb A1c MFr Bld: 7.7 % — ABNORMAL HIGH (ref 4.8–5.6)

## 2021-06-17 ENCOUNTER — Telehealth: Payer: Self-pay | Admitting: Family Medicine

## 2021-06-17 NOTE — Telephone Encounter (Signed)
Called patient to discuss results. Discussed A1c increase to 7.7 from 6.4 last year, and gave the option to start medication such as Metformin, or strict lifestyle changes, limiting sugar and carbs, increasing vegetables, getting in physical activity. She opted to try to make the changes on her own and follow up in 3 months to re check and start medication at that time if indicated. Discussed mildly elevated LDL, down from last check. Answered all of patient's questions   [name], D.O. [place] [facility], PGY-1

## 2021-07-18 ENCOUNTER — Other Ambulatory Visit: Payer: Self-pay | Admitting: Family Medicine

## 2021-07-18 DIAGNOSIS — F32A Depression, unspecified: Secondary | ICD-10-CM

## 2021-07-18 DIAGNOSIS — F419 Anxiety disorder, unspecified: Secondary | ICD-10-CM

## 2021-08-09 ENCOUNTER — Telehealth: Payer: Self-pay | Admitting: Cardiology

## 2021-08-09 ENCOUNTER — Other Ambulatory Visit: Payer: Self-pay

## 2021-08-09 ENCOUNTER — Other Ambulatory Visit: Payer: Self-pay | Admitting: Cardiology

## 2021-08-09 MED ORDER — CARVEDILOL 3.125 MG PO TABS: ORAL_TABLET | ORAL | 1 refills | Status: DC

## 2021-08-09 MED ORDER — CARVEDILOL 3.125 MG PO TABS
ORAL_TABLET | ORAL | 0 refills | Status: DC
Start: 2021-08-09 — End: 2021-08-15

## 2021-08-09 NOTE — Telephone Encounter (Signed)
Called pt and informed correct prescription for Carvedilol has been sent to the pharmacy. Pt also advised to keep up coming appointment for future refills. Pt verbalized understanding.

## 2021-08-09 NOTE — Telephone Encounter (Signed)
Pt c/o medication issue:  1. Name of Medication:  carvedilol (COREG) 3.125 MG tablet  2. How are you currently taking this medication (dosage and times per day)?  As prescribed  3. Are you having a reaction (difficulty breathing--STAT)?  No   4. What is your medication issue?   Patient is following up. She states she contacted the pharmacy and they are saying they received an Rx with instructions for her to only take 1 tablet daily. She states this is incorrect and her Rx hasn't been adjusted--she is to take 1 tablet in the morning and 2 in the evening. Can someone assist with correcting this and call the patient to confirm?

## 2021-08-09 NOTE — Telephone Encounter (Signed)
*  STAT* If patient is at the pharmacy, call can be transferred to refill team.   1. Which medications need to be refilled? (please list name of each medication and dose if known) carvedilol (COREG) 3.125 MG tablet  2. Which pharmacy/location (including street and city if local pharmacy) is medication to be sent to? [facility] [phone] - [place], [place]  3. Do they need a 30 day or 90 day supply? 90 day   Patient has an appointment 08/18/2021

## 2021-08-10 ENCOUNTER — Telehealth: Payer: Self-pay | Admitting: Cardiology

## 2021-08-10 NOTE — Telephone Encounter (Signed)
Left message to call back  

## 2021-08-10 NOTE — Telephone Encounter (Signed)
New messae:     Patient called and ask to speak with a nurse,because she did not want to explain to  me and then turn a round and tell the nurse. I tried to tell the patient that I protocol  is to reach out to a nurse. She stated I was over talking her and that I will get it today.The patient was rude with a altitude from the beginning  I told her to hold on to speak with my team lead. When I call Denny Peon to come speak with her she hung the phone.

## 2021-08-10 NOTE — Telephone Encounter (Signed)
Pharmacy is stating that they didn't receive new rx for 270 tablets where pt is taking one in the morning and two at night... are you able to resend this prescription with the correct dosage and quantity? Please advise

## 2021-08-15 ENCOUNTER — Telehealth: Payer: Self-pay | Admitting: Cardiology

## 2021-08-15 MED ORDER — CARVEDILOL 3.125 MG PO TABS: ORAL_TABLET | ORAL | 0 refills | Status: DC

## 2021-08-15 NOTE — Telephone Encounter (Signed)
Pt c/o medication issue:  1. Name of Medication: carvedilol (COREG) 3.125 MG tablet  2. How are you currently taking this medication (dosage and times per day)? Take 1 tablet (3.125 mg total) by mouth every morning AND 2 tablets (6.25 mg total) at bedtime.  3. Are you having a reaction (difficulty breathing--STAT)? no  4. What is your medication issue? Pt is waiting to hear back from someone in regards to this medication, pts refill is supposed to be for a total of 270 tablets based on how she is taking the medicine and pt has not been able to get this amount.. please advise

## 2021-08-15 NOTE — Telephone Encounter (Signed)
Spoke to patient, patient states she has been trying to get her carvedilol prescription and pharmacy still does not have rx.     Resent rx (confirmed pharmacy)-[facility].   Spoke to pharmacy staff-rx correct and received.

## 2021-08-18 ENCOUNTER — Ambulatory Visit: Payer: Medicaid Other | Admitting: Cardiology

## 2021-08-28 ENCOUNTER — Other Ambulatory Visit: Payer: Self-pay | Admitting: Family Medicine

## 2021-08-28 DIAGNOSIS — F419 Anxiety disorder, unspecified: Secondary | ICD-10-CM

## 2021-10-09 ENCOUNTER — Emergency Department (HOSPITAL_COMMUNITY)
Admission: EM | Admit: 2021-10-09 | Discharge: 2021-10-09 | Disposition: A | Discharge status: Home / Self Care | Payer: Medicaid Other | Attending: Emergency Medicine | Admitting: Emergency Medicine

## 2021-10-09 ENCOUNTER — Encounter (HOSPITAL_COMMUNITY): Payer: Self-pay

## 2021-10-09 ENCOUNTER — Emergency Department (HOSPITAL_COMMUNITY): Payer: Medicaid Other

## 2021-10-09 ENCOUNTER — Other Ambulatory Visit: Payer: Self-pay

## 2021-10-09 ENCOUNTER — Telehealth: Payer: Self-pay

## 2021-10-09 DIAGNOSIS — Z79899 Other long term (current) drug therapy: Secondary | ICD-10-CM | POA: Diagnosis not present

## 2021-10-09 DIAGNOSIS — Z8542 Personal history of malignant neoplasm of other parts of uterus: Secondary | ICD-10-CM | POA: Insufficient documentation

## 2021-10-09 DIAGNOSIS — I110 Hypertensive heart disease with heart failure: Secondary | ICD-10-CM | POA: Diagnosis not present

## 2021-10-09 DIAGNOSIS — I5042 Chronic combined systolic (congestive) and diastolic (congestive) heart failure: Secondary | ICD-10-CM | POA: Insufficient documentation

## 2021-10-09 DIAGNOSIS — R42 Dizziness and giddiness: Secondary | ICD-10-CM | POA: Diagnosis not present

## 2021-10-09 LAB — BASIC METABOLIC PANEL
Anion gap: 10 (ref 5–15)
BUN: 14 mg/dL (ref 6–20)
CO2: 25 mmol/L (ref 22–32)
Calcium: 8.9 mg/dL (ref 8.9–10.3)
Chloride: 102 mmol/L (ref 98–111)
Creatinine, Ser: 0.9 mg/dL (ref 0.44–1.00)
GFR, Estimated: >60 mL/min (ref >60)
Glucose, Bld: 134 mg/dL — ABNORMAL HIGH (ref 70–99)
Potassium: 3.9 mmol/L (ref 3.5–5.1)
Sodium: 137 mmol/L (ref 135–145)

## 2021-10-09 LAB — CBC
HCT: 46.8 % — ABNORMAL HIGH (ref 36.0–46.0)
Hemoglobin: 14.7 g/dL (ref 12.0–15.0)
MCH: 27 pg (ref 26.0–34.0)
MCHC: 31.4 g/dL (ref 30.0–36.0)
MCV: 85.9 fL (ref 80.0–100.0)
Platelets: 345 10*3/uL (ref 150–400)
RBC: 5.45 MIL/uL — ABNORMAL HIGH (ref 3.87–5.11)
RDW: 14.3 % (ref 11.5–15.5)
WBC: 6.5 10*3/uL (ref 4.0–10.5)
nRBC: 0 % (ref 0.0–0.2)

## 2021-10-09 MED ORDER — MECLIZINE HCL 25 MG PO TABS: 25.0000 mg | ORAL_TABLET | Freq: Three times a day (TID) | ORAL | 0 refills | Status: DC | PRN

## 2021-10-09 MED ORDER — MECLIZINE HCL 25 MG PO TABS
25.0000 mg | ORAL_TABLET | Freq: Once | ORAL | Status: AC
  Administered 2021-10-09: 25 mg via ORAL
  Filled 2021-10-09: qty 1

## 2021-10-09 NOTE — Telephone Encounter (Signed)
Patient calls nurse line requesting to speak with Dr. Arby Barrette regarding episodes of "feeling off balance and lightheadedness."   Patient reports these episodes of been intermittent since last week. Patient is unable to check BP at this time. Patient states that she feels better when she is lying down. She has been changing positions slowly and will continue to do so.   Advised that patient needs in person evaluation to determine cause of symptoms. Recommended UC, as we do not have appointments until Thursday. Patient declines at this time. Patient is requesting to schedule for our next available appointment. I have scheduled her for Thursday afternoon.   ED/UC precautions given. Patient verbalizes understanding.   Talbot Grumbling, RN

## 2021-10-09 NOTE — ED Triage Notes (Signed)
Pt BIB EMS from home. Pt c/o dizziness. Pt has hx of vertigo. Pt made a new appointment with a new PCP, but does not see PCP until next week. Pt states she is unsteady on her feet. Pt states she had ear pain last week, it still comes and goes.   112/80 96 HR 98% RA 176 CBG

## 2021-10-09 NOTE — Discharge Instructions (Signed)
Follow-up with your family doctor this week for recheck 

## 2021-10-09 NOTE — ED Provider Notes (Signed)
Emergency Medicine Provider Triage Evaluation Note  Deborah [name] , a 55 y.o. female  was evaluated in triage.  Pt complains of dizziness over 1 week duration.  She reports her episodes are intermittent and random.  Review of Systems  Positive: Dizziness, headaches Negative: Chest pain, syncope, shortness of breath, cough, lightheadedness, recent illness  Physical Exam  BP (!) 141/86   Pulse 79   Temp 98.8 F (37.1 C) (Oral)   Resp 18   LMP 09/20/2012   SpO2 99%  Gen:   Awake, no distress   Resp:  Normal effort  MSK:   Moves extremities without difficulty  Other:    Medical Decision Making  Medically screening exam initiated at 1:53 PM.  Appropriate orders placed.  Deborah [name] was informed that the remainder of the evaluation will be completed by another provider, this initial triage assessment does not replace that evaluation, and the importance of remaining in the ED until their evaluation is complete.     Deborah Courier, PA-C 10/09/21 1354    Deborah Saver, MD 10/09/21 [phone]

## 2021-10-09 NOTE — ED Provider Notes (Signed)
[facility] DEPT Provider Note   CSN: [id number] Arrival date & time: 10/09/21  1332     History Chief Complaint  Patient presents with   Dizziness    Deborah [name] is a 55 y.o. female.  Complains of dizziness and some congestion and left ear pain  The history is provided by the patient and medical records.  Dizziness Quality:  Head spinning Severity:  Moderate Onset quality:  Sudden Timing:  Intermittent Progression:  Waxing and waning Chronicity:  New Context: bending over   Relieved by:  Nothing Worsened by:  Nothing Ineffective treatments:  None tried Associated symptoms: no chest pain, no diarrhea and no headaches       Past Medical History:  Diagnosis Date   Anemia    Anxiety    Arthritis    ,Knees, back   Blood in stool 08/28/2012   Congenital birth defect    Right hand   Depression    Educated about COVID-19 virus infection 04/16/2019   Endometrial cancer ([place])    Far-sighted 08/28/2012   GASTROESOPHAGEAL REFLUX, NO ESOPHAGITIS 01/23/2007   Qualifier: Diagnosis of  By: Herma Ard     HLD (hyperlipidemia)    borderline   Hx of echocardiogram    Echo (9/15): EF 50-55%, normal wall motion, grade 2 diastolic dysfunction, mild LAE   Hx of radiation therapy 04/07/13- 05/14/13   pelvis 45 gray 25 fx   Hypertension    Obesity    Swelling    ANKLES - TAKES LASIX   Wrist pain 08/28/2012    Patient Active Problem List   Diagnosis Date Noted   Abdominal bloating 07/11/2020   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   Essential hypertension 05/17/2020   Healthcare maintenance 01/02/2020   Chronic combined systolic and diastolic CHF, 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   Sleep apnea 04/16/2019   Ventricular dysfunction of heart 09/19/2017   Hx of cholecystectomy 02/19/2015   Morbid obesity with BMI of 50.0-59.9, adult ([place]) 02/19/2015   History of endometrial cancer 10/08/2012   Hot flashes 07/18/2011   Anxiety and  depression 07/18/2011   LOSS, SENSORINEURAL HEARING, UNILAT 07/08/2007   BACK PAIN, LUMBAR 06/13/2007   Primary osteoarthritis of both knees 01/23/2007    Past Surgical History:  Procedure Laterality Date   ABDOMINAL HYSTERECTOMY  10/2011   complete   CHOLECYSTECTOMY N/A 07/08/2015   Procedure: LAPAROSCOPIC CHOLECYSTECTOMY WITH INTRAOPERATIVE CHOLANGIOGRAM;  Surgeon: Judeth Horn, MD;  Location: [facility];  Service: General;  Laterality: N/A;   HIATAL HERNIA REPAIR     REPAIR VAGINAL CUFF  10/07/2012   Procedure: REPAIR VAGINAL CUFF;  Surgeon: [name], MD PHD;  Location: [facility];  Service: Gynecology;;   ROBOTIC ASSISTED TOTAL HYSTERECTOMY WITH BILATERAL SALPINGO OOPHERECTOMY  10/07/2012   Procedure: ROBOTIC ASSISTED TOTAL HYSTERECTOMY WITH BILATERAL SALPINGO OOPHORECTOMY;  Surgeon: [name], MD PHD;  Location: [facility];  Service: Gynecology;  Laterality: Bilateral;     OB History     Gravida  2   Para  1   Term  0   Preterm  1   AB  1   Living  1      SAB  1   IAB  0   Ectopic  0   Multiple  0   Live Births              Family History  Problem Relation Age of Onset   Diabetes Mother    Pancreatic cancer Maternal  Grandfather        or liver cancer   Hypertension Maternal Grandfather    Cancer Maternal Grandfather        pancreatic   Hypertension Maternal Grandmother    Colon polyps Maternal Aunt    Cancer Maternal Aunt        lung   Colon cancer Cousin    Cancer Cousin        lung   Lung cancer Maternal Aunt    Stroke Cousin    Heart attack Neg Hx     Social History   Tobacco Use   Smoking status: Never   Smokeless tobacco: Never  Vaping Use   Vaping Use: Never used  Substance Use Topics   Alcohol use: No    Comment: rare   Drug use: No    Home Medications Prior to Admission medications   Medication Sig Start Date End Date Taking? Authorizing Provider  meclizine (ANTIVERT) 25 MG tablet Take 1 tablet (25 mg total) by mouth 3  (three) times daily as needed for dizziness. 10/09/21  Yes Milton Ferguson, MD  acetaminophen (TYLENOL) 500 MG tablet Take 1,000 mg by mouth every 6 (six) hours as needed for moderate pain (pain).    [provider]  carvedilol (COREG) 3.125 MG tablet Take 1 tablet (3.125 mg total) by mouth every morning AND 2 tablets (6.25 mg total) at bedtime. 08/15/21   Minus Breeding, MD  diazepam (VALIUM) 5 MG tablet TAKE 1 TABLET(5 MG) BY MOUTH EVERY 12 HOURS AS NEEDED FOR ANXIETY 08/28/21   Arby Barrette, Weldon Picking, DO  diclofenac sodium (VOLTAREN) 1 % GEL Apply 4 g topically 4 (four) times daily. 10/02/19   Tedd Sias, PA  diclofenac Sodium (VOLTAREN) 1 % GEL Apply 2 g topically 4 (four) times daily. 04/23/21   Couture, Cortni S, PA-C  fluticasone (FLONASE) 50 MCG/ACT nasal spray Place 2 sprays into both nostrils daily. 06/14/21   [name], DO  ibuprofen (ADVIL,MOTRIN) 800 MG tablet Take 1 tablet (800 mg total) by mouth every 8 (eight) hours as needed. 09/18/17   Smiley Houseman, MD  omeprazole (PRILOSEC) 20 MG capsule TAKE 1 CAPSULE(20 MG) BY MOUTH DAILY 06/15/21   Arby Barrette, [place] J, DO  polyethylene glycol powder (GLYCOLAX/MIRALAX) 17 GM/SCOOP powder Take 17 g by mouth daily as needed. 07/07/20   Patriciaann Clan, DO  sertraline (ZOLOFT) 50 MG tablet Take 1 tablet (50 mg total) by mouth daily. Start with 1/2 tablet for a few days. 06/14/21   [name], DO    Allergies    Lactose intolerance (gi)  Review of Systems   Review of Systems  Constitutional:  Negative for appetite change and fatigue.  HENT:  Negative for congestion, ear discharge and sinus pressure.   Eyes:  Negative for discharge.  Respiratory:  Negative for cough.   Cardiovascular:  Negative for chest pain.  Gastrointestinal:  Negative for abdominal pain and diarrhea.  Genitourinary:  Negative for frequency and hematuria.  Musculoskeletal:  Negative for back pain.  Skin:  Negative for rash.  Neurological:   Positive for dizziness. Negative for seizures and headaches.  Psychiatric/Behavioral:  Negative for hallucinations.    Physical Exam Updated Vital Signs BP (!) 107/91   Pulse (!) 101   Temp 98.8 F (37.1 C) (Oral)   Resp 20   LMP 09/20/2012   SpO2 99%   Physical Exam Vitals and nursing note reviewed.  Constitutional:      Appearance: She is  well-developed.  HENT:     Head: Normocephalic.     Nose: Nose normal.  Eyes:     General: No scleral icterus.    Conjunctiva/sclera: Conjunctivae normal.     Comments: No nystagmus  Neck:     Thyroid: No thyromegaly.  Cardiovascular:     Rate and Rhythm: Normal rate and regular rhythm.     Heart sounds: No murmur heard.   No friction rub. No gallop.  Pulmonary:     Breath sounds: No stridor. No wheezing or rales.  Chest:     Chest wall: No tenderness.  Abdominal:     General: There is no distension.     Tenderness: There is no abdominal tenderness. There is no rebound.  Musculoskeletal:        General: Normal range of motion.     Cervical back: Neck supple.  Lymphadenopathy:     Cervical: No cervical adenopathy.  Skin:    Findings: No erythema or rash.  Neurological:     Mental Status: She is alert and oriented to person, place, and time.     Motor: No abnormal muscle tone.     Coordination: Coordination normal.  Psychiatric:        Behavior: Behavior normal.    ED Results / Procedures / Treatments   Labs (all labs ordered are listed, but only abnormal results are displayed) Labs Reviewed  CBC - Abnormal; Notable for the following components:      Result Value   RBC 5.45 (*)    HCT 46.8 (*)    All other components within normal limits  BASIC METABOLIC PANEL - Abnormal; Notable for the following components:   Glucose, Bld 134 (*)    All other components within normal limits    EKG None  Radiology MR BRAIN WO CONTRAST  Result Date: 10/09/2021 CLINICAL DATA:  Initial evaluation for acute dizziness. EXAM: MRI  HEAD WITHOUT CONTRAST TECHNIQUE: Multiplanar, multiecho pulse sequences of the brain and surrounding structures were obtained without intravenous contrast. COMPARISON:  None available. FINDINGS: Brain: Cerebral volume within normal limits. Scattered patchy T2/FLAIR hyperintensity present within the periventricular deep white matter both cerebral hemispheres, most like related chronic microvascular ischemic disease, mild to moderate in nature. Mild patchy involvement of the pons noted. No abnormal foci of restricted diffusion to suggest acute or subacute ischemia. Gray-white matter differentiation maintained. No encephalomalacia to suggest chronic cortical infarction. No evidence for acute or chronic intracranial hemorrhage. No mass lesion, midline shift or mass effect. No hydrocephalus or extra-axial fluid collection. Pituitary gland suprasellar region within normal limits. Midline structures intact. Vascular: Major intracranial vascular flow voids are maintained. Dominant right vertebral artery noted. Skull and upper cervical spine: Basilar junction within normal limits. Diffusely decreased T1 signal intensity seen throughout the visualized bone marrow. Small benign hemangioma noted within the C2 vertebral body. No worrisome osseous lesions. No scalp soft tissue abnormality. Sinuses/Orbits: Globes and orbital soft tissues demonstrate no acute finding. Mild mucosal thickening noted within the ethmoidal air cells. Paranasal sinuses are otherwise clear. No mastoid effusion. Inner ear structures grossly normal. Other: 1 cm T2 hyperintense polypoid cystic lesion present at the lateral margin of the right orbit (series 8, image 8), nonspecific, but likely benign. IMPRESSION: 1. No acute intracranial abnormality. 2. 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 3. 1 cm polypoid cystic lesion at the lateral margin of the right orbit, nonspecific, but likely benign. Correlation with physical exam  recommended. Electronically Signed   By: [name]  Jeannine Boga M.D.   On: 10/09/2021 21:36    Procedures Procedures   Medications Ordered in ED Medications  meclizine (ANTIVERT) tablet 25 mg (25 mg Oral Given 10/09/21 1832)    ED Course  I have reviewed the triage vital signs and the nursing notes.  Pertinent labs & imaging results that were available during my care of the patient were reviewed by me and considered in my medical decision making (see chart for details).    MDM Rules/Calculators/A&P                           Patient with vertigo symptoms.  MRI negative.  Patient will follow up with PCP and use Antivert as needed Final Clinical Impression(s) / ED Diagnoses Final diagnoses:  Vertigo    Rx / DC Orders ED Discharge Orders          Ordered    meclizine (ANTIVERT) 25 MG tablet  3 times daily PRN        10/09/21 2214             Milton Ferguson, MD 10/13/21 1011

## 2021-10-10 ENCOUNTER — Telehealth: Payer: Self-pay

## 2021-10-10 NOTE — Telephone Encounter (Signed)
Transition Care Management Unsuccessful Follow-up Telephone Call  Date of discharge and from where:  10/09/2021-[facility]   Attempts:  1st Attempt  Reason for unsuccessful TCM follow-up call:  Left voice message

## 2021-10-11 NOTE — Telephone Encounter (Signed)
Transition Care Management Unsuccessful Follow-up Telephone Call  Date of discharge and from where:  10/09/2021 from [facility]  Attempts:  2nd Attempt  Reason for unsuccessful TCM follow-up call:  Left voice message

## 2021-10-12 ENCOUNTER — Ambulatory Visit: Payer: Medicaid Other

## 2021-10-12 NOTE — Telephone Encounter (Signed)
Transition Care Management Unsuccessful Follow-up Telephone Call  Date of discharge and from where:  10/09/2021 from [facility]  Attempts:  3rd Attempt  Reason for unsuccessful TCM follow-up call:  Unable to reach patient

## 2021-10-17 ENCOUNTER — Other Ambulatory Visit: Payer: Self-pay | Admitting: Family Medicine

## 2021-10-24 ENCOUNTER — Other Ambulatory Visit: Payer: Self-pay | Admitting: Family Medicine

## 2021-10-24 DIAGNOSIS — F419 Anxiety disorder, unspecified: Secondary | ICD-10-CM

## 2021-10-24 DIAGNOSIS — F32A Depression, unspecified: Secondary | ICD-10-CM

## 2021-10-26 MED ORDER — MECLIZINE HCL 25 MG PO TABS: ORAL_TABLET | ORAL | 0 refills | Status: DC

## 2021-10-31 ENCOUNTER — Telehealth: Payer: Self-pay

## 2021-10-31 NOTE — Telephone Encounter (Signed)
Patient called and canceled appt for 11/01/21. Pt stated she is not feeling well and will give [place] a call back to reschedule.

## 2021-11-01 ENCOUNTER — Ambulatory Visit: Payer: Medicaid Other | Admitting: Obstetrics & Gynecology

## 2021-11-06 ENCOUNTER — Telehealth: Payer: Self-pay | Admitting: Cardiology

## 2021-11-06 MED ORDER — CARVEDILOL 3.125 MG PO TABS: ORAL_TABLET | ORAL | 0 refills | Status: DC

## 2021-11-06 NOTE — Telephone Encounter (Signed)
Pt c/o medication issue:  1. Name of Medication: carvedilol (COREG) 3.125 MG tablet  2. How are you currently taking this medication (dosage and times per day)?   3. Are you having a reaction (difficulty breathing--STAT)?   4. What is your medication issue? Pt said their is an issue with this medicine but she does not want to explain it to me and then turn around and explain it to a nurse so she would rather the nurse call her back so she can explain it to the nurse.  Pt is currently on the wait list for [facility]

## 2021-11-06 NOTE — Telephone Encounter (Signed)
Patient needs a refill of her carvedilol. This has been sent in and patient is aware.

## 2021-11-09 ENCOUNTER — Other Ambulatory Visit: Payer: Self-pay | Admitting: Family Medicine

## 2021-11-10 ENCOUNTER — Other Ambulatory Visit: Payer: Self-pay | Admitting: Family Medicine

## 2021-11-10 NOTE — Telephone Encounter (Signed)
Patient calls nurse line requesting more than #20 tabs. Patient reports she takes this medication TID and keeps running out. Please advise.

## 2021-11-17 ENCOUNTER — Ambulatory Visit: Payer: Medicaid Other | Admitting: Family Medicine

## 2021-11-27 ENCOUNTER — Other Ambulatory Visit: Payer: Self-pay | Admitting: Family Medicine

## 2021-11-28 NOTE — Telephone Encounter (Signed)
Patient calls nurse line requesting a refill on Meclizine. Patient reports she has a cold and can not make an apt. Patient advised she did miss her apt in December, however we will send request to provider. Patient stated, "you cant hold no medication for somebody." Patient advised she will be called if an apt is needed.   Will forward to PCP.

## 2021-11-29 ENCOUNTER — Other Ambulatory Visit: Payer: Self-pay | Admitting: Family Medicine

## 2021-11-29 NOTE — Telephone Encounter (Signed)
Sent refill on meclizine as requested (see separate refill request encounter). Sent 45 tablets and advised patient it would be her last refill until she is seen in the office on 1/24.

## 2021-12-19 ENCOUNTER — Ambulatory Visit: Payer: Medicaid Other | Admitting: Family Medicine

## 2021-12-27 ENCOUNTER — Other Ambulatory Visit: Payer: Self-pay | Admitting: Family Medicine

## 2021-12-27 DIAGNOSIS — F419 Anxiety disorder, unspecified: Secondary | ICD-10-CM

## 2021-12-27 DIAGNOSIS — F32A Depression, unspecified: Secondary | ICD-10-CM

## 2022-01-11 ENCOUNTER — Observation Stay (HOSPITAL_COMMUNITY): Payer: Medicaid Other

## 2022-01-11 ENCOUNTER — Inpatient Hospital Stay (HOSPITAL_COMMUNITY)
Admission: EM | Admit: 2022-01-11 | Discharge: 2022-01-16 | DRG: 638 | Disposition: A | Discharge status: Home Health | Payer: Medicaid Other | Attending: Family Medicine | Admitting: Family Medicine

## 2022-01-11 ENCOUNTER — Encounter (HOSPITAL_COMMUNITY): Payer: Self-pay | Admitting: Emergency Medicine

## 2022-01-11 ENCOUNTER — Other Ambulatory Visit: Payer: Self-pay

## 2022-01-11 DIAGNOSIS — E785 Hyperlipidemia, unspecified: Secondary | ICD-10-CM | POA: Diagnosis present

## 2022-01-11 DIAGNOSIS — Z90710 Acquired absence of both cervix and uterus: Secondary | ICD-10-CM

## 2022-01-11 DIAGNOSIS — Z8249 Family history of ischemic heart disease and other diseases of the circulatory system: Secondary | ICD-10-CM

## 2022-01-11 DIAGNOSIS — I110 Hypertensive heart disease with heart failure: Secondary | ICD-10-CM | POA: Diagnosis present

## 2022-01-11 DIAGNOSIS — N179 Acute kidney failure, unspecified: Secondary | ICD-10-CM

## 2022-01-11 DIAGNOSIS — K219 Gastro-esophageal reflux disease without esophagitis: Secondary | ICD-10-CM | POA: Diagnosis present

## 2022-01-11 DIAGNOSIS — Z823 Family history of stroke: Secondary | ICD-10-CM

## 2022-01-11 DIAGNOSIS — E876 Hypokalemia: Secondary | ICD-10-CM | POA: Diagnosis not present

## 2022-01-11 DIAGNOSIS — E860 Dehydration: Secondary | ICD-10-CM | POA: Diagnosis present

## 2022-01-11 DIAGNOSIS — L89892 Pressure ulcer of other site, stage 2: Secondary | ICD-10-CM | POA: Diagnosis present

## 2022-01-11 DIAGNOSIS — Z833 Family history of diabetes mellitus: Secondary | ICD-10-CM

## 2022-01-11 DIAGNOSIS — F32A Depression, unspecified: Secondary | ICD-10-CM | POA: Diagnosis present

## 2022-01-11 DIAGNOSIS — E1110 Type 2 diabetes mellitus with ketoacidosis without coma: Principal | ICD-10-CM

## 2022-01-11 DIAGNOSIS — Z794 Long term (current) use of insulin: Secondary | ICD-10-CM

## 2022-01-11 DIAGNOSIS — Z8542 Personal history of malignant neoplasm of other parts of uterus: Secondary | ICD-10-CM

## 2022-01-11 DIAGNOSIS — Z79899 Other long term (current) drug therapy: Secondary | ICD-10-CM

## 2022-01-11 DIAGNOSIS — E739 Lactose intolerance, unspecified: Secondary | ICD-10-CM | POA: Diagnosis present

## 2022-01-11 DIAGNOSIS — E8881 Metabolic syndrome: Secondary | ICD-10-CM | POA: Diagnosis present

## 2022-01-11 DIAGNOSIS — Z6841 Body Mass Index (BMI) 40.0 and over, adult: Secondary | ICD-10-CM

## 2022-01-11 DIAGNOSIS — R5381 Other malaise: Secondary | ICD-10-CM

## 2022-01-11 DIAGNOSIS — I5042 Chronic combined systolic (congestive) and diastolic (congestive) heart failure: Secondary | ICD-10-CM

## 2022-01-11 DIAGNOSIS — R42 Dizziness and giddiness: Secondary | ICD-10-CM | POA: Diagnosis not present

## 2022-01-11 DIAGNOSIS — Z20822 Contact with and (suspected) exposure to covid-19: Secondary | ICD-10-CM | POA: Diagnosis present

## 2022-01-11 DIAGNOSIS — Z923 Personal history of irradiation: Secondary | ICD-10-CM

## 2022-01-11 DIAGNOSIS — E119 Type 2 diabetes mellitus without complications: Secondary | ICD-10-CM | POA: Diagnosis present

## 2022-01-11 DIAGNOSIS — F419 Anxiety disorder, unspecified: Secondary | ICD-10-CM | POA: Diagnosis present

## 2022-01-11 DIAGNOSIS — Z801 Family history of malignant neoplasm of trachea, bronchus and lung: Secondary | ICD-10-CM

## 2022-01-11 DIAGNOSIS — R000 Tachycardia, unspecified: Secondary | ICD-10-CM | POA: Diagnosis not present

## 2022-01-11 DIAGNOSIS — I10 Essential (primary) hypertension: Secondary | ICD-10-CM

## 2022-01-11 DIAGNOSIS — R531 Weakness: Secondary | ICD-10-CM | POA: Diagnosis not present

## 2022-01-11 DIAGNOSIS — L8990 Pressure ulcer of unspecified site, unspecified stage: Secondary | ICD-10-CM | POA: Insufficient documentation

## 2022-01-11 DIAGNOSIS — Z8371 Family history of colonic polyps: Secondary | ICD-10-CM

## 2022-01-11 DIAGNOSIS — R739 Hyperglycemia, unspecified: Secondary | ICD-10-CM | POA: Diagnosis not present

## 2022-01-11 DIAGNOSIS — Z800 Family history of malignant neoplasm of digestive organs: Secondary | ICD-10-CM

## 2022-01-11 DIAGNOSIS — G4730 Sleep apnea, unspecified: Secondary | ICD-10-CM

## 2022-01-11 LAB — GLUCOSE, CAPILLARY: Glucose-Capillary: >600 mg/dL (ref 70–99)

## 2022-01-11 LAB — BASIC METABOLIC PANEL
Anion gap: 28 — ABNORMAL HIGH (ref 5–15)
BUN: 69 mg/dL — ABNORMAL HIGH (ref 6–20)
CO2: 17 mmol/L — ABNORMAL LOW (ref 22–32)
Calcium: 8.2 mg/dL — ABNORMAL LOW (ref 8.9–10.3)
Chloride: 86 mmol/L — ABNORMAL LOW (ref 98–111)
Creatinine, Ser: 3.21 mg/dL — ABNORMAL HIGH (ref 0.44–1.00)
GFR, Estimated: 17 mL/min — ABNORMAL LOW (ref >60)
Glucose, Bld: 1403 mg/dL (ref 70–99)
Potassium: 4.5 mmol/L (ref 3.5–5.1)
Sodium: 131 mmol/L — ABNORMAL LOW (ref 135–145)

## 2022-01-11 LAB — CBC WITH DIFFERENTIAL/PLATELET
Abs Immature Granulocytes: 0.09 10*3/uL — ABNORMAL HIGH (ref 0.00–0.07)
Basophils Absolute: 0 10*3/uL (ref 0.0–0.1)
Basophils Relative: 0 %
Eosinophils Absolute: 0 10*3/uL (ref 0.0–0.5)
Eosinophils Relative: 0 %
HCT: 51.6 % — ABNORMAL HIGH (ref 36.0–46.0)
Hemoglobin: 15.4 g/dL — ABNORMAL HIGH (ref 12.0–15.0)
Immature Granulocytes: 1 %
Lymphocytes Relative: 17 %
Lymphs Abs: 1.5 10*3/uL (ref 0.7–4.0)
MCH: 27.8 pg (ref 26.0–34.0)
MCHC: 29.8 g/dL — ABNORMAL LOW (ref 30.0–36.0)
MCV: 93.3 fL (ref 80.0–100.0)
Monocytes Absolute: 0.8 10*3/uL (ref 0.1–1.0)
Monocytes Relative: 9 %
Neutro Abs: 6.8 10*3/uL (ref 1.7–7.7)
Neutrophils Relative %: 73 %
Platelets: 250 10*3/uL (ref 150–400)
RBC: 5.53 MIL/uL — ABNORMAL HIGH (ref 3.87–5.11)
RDW: 14.7 % (ref 11.5–15.5)
WBC: 9.3 10*3/uL (ref 4.0–10.5)
nRBC: 0 % (ref 0.0–0.2)

## 2022-01-11 LAB — BLOOD GAS, VENOUS
Acid-base deficit: 5.7 mmol/L — ABNORMAL HIGH (ref 0.0–2.0)
Bicarbonate: 21.1 mmol/L (ref 20.0–28.0)
O2 Saturation: 62.6 %
Patient temperature: 37
pCO2, Ven: 45 mmHg (ref 44–60)
pH, Ven: 7.28 (ref 7.25–7.43)
pO2, Ven: 44 mmHg (ref 32–45)

## 2022-01-11 LAB — MAGNESIUM: Magnesium: 2.8 mg/dL — ABNORMAL HIGH (ref 1.7–2.4)

## 2022-01-11 LAB — TROPONIN I (HIGH SENSITIVITY): Troponin I (High Sensitivity): 10 ng/L (ref <18)

## 2022-01-11 LAB — I-STAT BETA HCG BLOOD, ED (MC, WL, AP ONLY): I-stat hCG, quantitative: <5 m[IU]/mL (ref <5)

## 2022-01-11 LAB — CBG MONITORING, ED
Glucose-Capillary: >600 mg/dL (ref 70–99)
Glucose-Capillary: >600 mg/dL (ref 70–99)
Glucose-Capillary: >600 mg/dL (ref 70–99)
Glucose-Capillary: >600 mg/dL (ref 70–99)

## 2022-01-11 LAB — BETA-HYDROXYBUTYRIC ACID: Beta-Hydroxybutyric Acid: 6.68 mmol/L — ABNORMAL HIGH (ref 0.05–0.27)

## 2022-01-11 MED ORDER — NYSTATIN 100000 UNIT/GM EX POWD
Freq: Three times a day (TID) | CUTANEOUS | Status: DC
  Filled 2022-01-11 (×2): qty 15

## 2022-01-11 MED ORDER — CARVEDILOL 3.125 MG PO TABS
3.1250 mg | ORAL_TABLET | Freq: Two times a day (BID) | ORAL | Status: DC
  Administered 2022-01-12 – 2022-01-14 (×5): 3.125 mg via ORAL
  Filled 2022-01-11 (×6): qty 1

## 2022-01-11 MED ORDER — CHLORHEXIDINE GLUCONATE CLOTH 2 % EX PADS
6.0000 | MEDICATED_PAD | Freq: Every day | CUTANEOUS | Status: DC
  Administered 2022-01-12 – 2022-01-16 (×5): 6 via TOPICAL

## 2022-01-11 MED ORDER — SODIUM CHLORIDE 0.9 % IV BOLUS
1000.0000 mL | Freq: Once | INTRAVENOUS | Status: AC
  Administered 2022-01-11: 1000 mL via INTRAVENOUS

## 2022-01-11 MED ORDER — DEXTROSE IN LACTATED RINGERS 5 % IV SOLN: INTRAVENOUS | Status: DC

## 2022-01-11 MED ORDER — SODIUM CHLORIDE 0.9 % IV BOLUS
500.0000 mL | Freq: Once | INTRAVENOUS | Status: AC
  Administered 2022-01-11: 500 mL via INTRAVENOUS

## 2022-01-11 MED ORDER — INSULIN REGULAR(HUMAN) IN NACL 100-0.9 UT/100ML-% IV SOLN
INTRAVENOUS | Status: DC
  Administered 2022-01-11: 11.5 [IU]/h via INTRAVENOUS
  Filled 2022-01-11: qty 100

## 2022-01-11 MED ORDER — SODIUM CHLORIDE 0.9 % IV BOLUS: 1000.0000 mL | Freq: Once | INTRAVENOUS | Status: DC

## 2022-01-11 MED ORDER — DEXTROSE 50 % IV SOLN: 0.0000 mL | INTRAVENOUS | Status: DC | PRN

## 2022-01-11 MED ORDER — POTASSIUM CHLORIDE 10 MEQ/100ML IV SOLN
10.0000 meq | INTRAVENOUS | Status: AC
  Administered 2022-01-11 – 2022-01-12 (×2): 10 meq via INTRAVENOUS
  Filled 2022-01-11 (×2): qty 100

## 2022-01-11 MED ORDER — ORAL CARE MOUTH RINSE
15.0000 mL | Freq: Two times a day (BID) | OROMUCOSAL | Status: DC
  Administered 2022-01-12 – 2022-01-16 (×10): 15 mL via OROMUCOSAL

## 2022-01-11 MED ORDER — INSULIN REGULAR(HUMAN) IN NACL 100-0.9 UT/100ML-% IV SOLN
INTRAVENOUS | Status: DC
  Administered 2022-01-12: 7 [IU]/h via INTRAVENOUS
  Administered 2022-01-12: 11.5 [IU]/h via INTRAVENOUS
  Filled 2022-01-11: qty 100

## 2022-01-11 MED ORDER — LACTATED RINGERS IV SOLN: INTRAVENOUS | Status: DC

## 2022-01-11 NOTE — ED Provider Triage Note (Signed)
Emergency Medicine Provider Triage Evaluation Note  Deborah [name] , a 56 y.o. female  was evaluated in triage.  Pt complains of blurred vision, hand cramping, generalized weakness began around noon today.  She denies similar symptoms in the past.  Symptoms have been constant in onset, her family member called EMS.  EMS noted patient's blood sugar to be 388, patient denies history of diabetes.  Review of Systems  Positive: Blurred vision, hand cramping, generalized weakness Negative: Fall, injury, fever, chills, chest pain/shortness of breath, abdominal pain, vomiting, diarrhea, dysuria or any additional concerns.  Physical Exam  BP 125/79 (BP Location: Left Arm)    Pulse (!) 101    Temp 98.6 F (37 C) (Oral)    Resp 16    LMP 09/20/2012    SpO2 94%  Gen:   Awake, no distress   Resp:  Normal effort  MSK:   Moves extremities without difficulty. Congenital abnormality of the right hand Other:  Speech is clear and goal oriented, follows commands Major Cranial nerves without deficit, no facial droop Slightly diminished but equal strength in upper and lower extremities bilaterally including dorsiflexion and plantar flexion, strong and equal grip strength Sensation normal to light and sharp touch Moves extremities without ataxia, coordination intact Patient reports she feels to weak to perform pronator drift test.   Medical Decision Making  Medically screening exam initiated at 2:48 PM.  Appropriate orders placed.  [name] was informed that the remainder of the evaluation will be completed by another provider, this initial triage assessment does not replace that evaluation, and the importance of remaining in the ED until their evaluation is complete.  Patient had generalized weakness, blurred vision and hand cramping beginning at noon today.  On exam she is tired appearing but in no acute distress.  Cranial nerves are intact, no meningeal signs.  No tenderness of the chest or abdomen.   Patient has equal strength in the bilateral upper and lower extremities however slightly weak symmetrically possibly due to patient effort.  No focal deficits to suggest CVA.  Patient denies history of diabetes however point-of-care glucose is over 600 here.  Possible new onset DKA.  Lab work ordered in addition to IV fluids.  Nursing staff aware not to place the patient back in the waiting room, she is next for a room.     Note: Portions of this report may have been transcribed using voice recognition software. Every effort was made to ensure accuracy; however, inadvertent computerized transcription errors may still be present.    Deliah Boston, PA-C 01/11/22 1451

## 2022-01-11 NOTE — ED Notes (Signed)
Waiting on ultrasound IV placement.

## 2022-01-11 NOTE — H&P (Signed)
KATANA BERTHOLD SWF:[id number] DOB: 09-24-1966 DOA: 01/11/2022     PCP: [name], DO   Outpatient Specialists:   CARDS:  Dr. Percival Spanish    Patient arrived to ER on 01/11/22 at 1403 Referred by Attending Dorie Rank, MD   Patient coming from:    home Lives alone,        Chief Complaint:   Chief Complaint  Patient presents with   Hyperglycemia    HPI: AKIRE RENNERT is a 56 y.o. female with medical history significant of CHF, HTN HLD, Obesity    Presented with   fatigue Came in with fatigue, polyuria polydypsia, muscle cramps no prior hx of DM No fever no chills No CP no SOB  Patient denies any chest pain   Initial COVID TEST  in house  PCR testing  Pending  Lab Results  Component Value Date   [place] Not Detected 11/02/2019     Regarding pertinent Chronic problems:      HTN on  Coreg   chronic CHF diastolic/systolic/ combined - last echo 2019 EF45% -   50% (grade 1    diastolic dysfunction).        DM 2 -  Lab Results  Component Value Date   HGBA1C 7.7 (H) 06/14/2021   diet controlled patient was not aware that she was diabetic       Morbid obesity-   BMI Readings from Last 1 Encounters:  06/14/21 50.89 kg/m      OSA -  states her CPAP was never started     While in ER: Clinical Course as of 01/12/22 0009  Thu Jan 11, 2022  1832 Point of care care blood sugar greater than 600 [JK]  2012 CBC with Differential(!) CBC shows increased hemoglobin, new compared to previous values [JK]  2013 Blood gas, venous(!) No signs of acidosis [JK]  2202 Basic metabolic panel(!!) Metabolic panel shows glucose of 1403.  Patient's anion gap is also increased at 28 with bicarb down to 17.  Suggests DKA although degree of hyperglycemia would suggest more of a hyperosmolar syndrome [JK]  2156 Case discussed with Dr Roel Cluck regarding admission [JK]    Clinical Course User Index [JK] Dorie Rank, MD     Ordered    CXR -  NON acute     Following Medications were ordered in ER: Medications  insulin regular, human (MYXREDLIN) 100 units/ 100 mL infusion (11.5 Units/hr Intravenous New Bag/Given 01/11/22 2124)  lactated ringers infusion (has no administration in time range)  dextrose 5 % in lactated ringers infusion (has no administration in time range)  dextrose 50 % solution 0-50 mL (has no administration in time range)  potassium chloride 10 mEq in 100 mL IVPB (has no administration in time range)  sodium chloride 0.9 % bolus 500 mL (500 mLs Intravenous New Bag/Given 01/11/22 1917)  sodium chloride 0.9 % bolus 1,000 mL (1,000 mLs Intravenous New Bag/Given 01/11/22 2126)    ____________    ED Triage Vitals  Enc Vitals Group     BP 01/11/22 1423 125/79     Pulse Rate 01/11/22 1423 (!) 101     Resp 01/11/22 1423 16     Temp 01/11/22 1423 98.6 F (37 C)     Temp Source 01/11/22 1423 Oral     SpO2 01/11/22 1423 94 %     Weight --      Height --      Head Circumference --  Peak Flow --      Pain Score 01/11/22 1411 0     Pain Loc --      Pain Edu? --      Excl. in [place]? --   TMAX(24)@     _________________________________________ Significant initial  Findings: Abnormal Labs Reviewed  CBC WITH DIFFERENTIAL/PLATELET - Abnormal; Notable for the following components:      Result Value   RBC 5.53 (*)    Hemoglobin 15.4 (*)    HCT 51.6 (*)    MCHC 29.8 (*)    Abs Immature Granulocytes 0.09 (*)    All other components within normal limits  BASIC METABOLIC PANEL - Abnormal; Notable for the following components:   Sodium 131 (*)    Chloride 86 (*)    CO2 17 (*)    Glucose, Bld 1,403 (*)    BUN 69 (*)    Creatinine, Ser 3.21 (*)    Calcium 8.2 (*)    GFR, Estimated 17 (*)    Anion gap 28 (*)    All other components within normal limits  MAGNESIUM - Abnormal; Notable for the following components:   Magnesium 2.8 (*)    All other components within normal limits  BETA-HYDROXYBUTYRIC ACID - Abnormal; Notable for  the following components:   Beta-Hydroxybutyric Acid 6.68 (*)    All other components within normal limits  BLOOD GAS, VENOUS - Abnormal; Notable for the following components:   Acid-base deficit 5.7 (*)    All other components within normal limits  CBG MONITORING, ED - Abnormal; Notable for the following components:   Glucose-Capillary >600 (*)    All other components within normal limits  CBG MONITORING, ED - Abnormal; Notable for the following components:   Glucose-Capillary >600 (*)    All other components within normal limits     _________________________ Troponin 10  ECG: Ordered Personally reviewed by me showing: HR : 101 Rhythm:   Sinus tachycardia    no evidence of ischemic changes QTC 481     The recent clinical data is shown below. Vitals:   01/11/22 1423 01/11/22 1846 01/11/22 2000  BP: 125/79 (!) 85/64 (!) 120/43  Pulse: (!) 101 83 99  Resp: 16 (!) 22 (!) 22  Temp: 98.6 F (37 C)    TempSrc: Oral    SpO2: 94% 91% 99%     WBC     Component Value Date/Time   WBC 9.3 01/11/2022 1914   LYMPHSABS 1.5 01/11/2022 1914   LYMPHSABS 0.8 (L) 08/18/2014 1316   MONOABS 0.8 01/11/2022 1914   MONOABS 0.4 08/18/2014 1316   EOSABS 0.0 01/11/2022 1914   EOSABS 0.1 08/18/2014 1316   BASOSABS 0.0 01/11/2022 1914   BASOSABS 0.0 08/18/2014 1316     UA   ordered   Urine analysis:    Component Value Date/Time   BILIRUBINUR NEG 05/05/2015 1700   PROTEINUR TRACE 05/05/2015 1700   UROBILINOGEN 0.2 05/05/2015 1700   NITRITE NEG 05/05/2015 1700   LEUKOCYTESUR Negative 05/05/2015 1700    Results for orders placed or performed in visit on 11/02/19  Novel Coronavirus, NAA ([facility])     Status: None   Collection Time: 11/02/19 12:00 AM   Specimen: Nasopharyngeal(NP) swabs in vial transport medium   NASOPHARYNGE  TESTING  Result Value Ref Range Status   SARS-CoV-2, NAA Not Detected Not Detected Final    Comment: This nucleic acid amplification test was developed and  its performance characteristics determined by [employer]. Nucleic acid  amplification tests include PCR and TMA. This test has not been FDA cleared or approved. This test has been authorized by FDA under an Emergency Use Authorization (EUA). This test is only authorized for the duration of time the declaration that circumstances exist justifying the authorization of the emergency use of in vitro diagnostic tests for detection of SARS-CoV-2 virus and/or diagnosis of COVID-19 infection under section 564(b)(1) of the Act, 21 U.S.C. 315VVO-1(Y) (1), unless the authorization is terminated or revoked sooner. When diagnostic testing is negative, the possibility of a false negative result should be considered in the context of a patient's recent exposures and the presence of clinical signs and symptoms consistent with COVID-19. An individual without symptoms of COVID-19 and who is not shedding SARS-CoV-2 virus would  expect to have a negative (not detected) result in this assay.      _______________________________________________ Hospitalist was called for admission for  DKA   The following Work up has been ordered so far:  Orders Placed This Encounter  Procedures   Critical Care   Resp Panel by RT-PCR (Flu A&B, Covid) Nasopharyngeal Swab   CBC with Differential   Basic metabolic panel   Urinalysis, Routine w reflex microscopic   Magnesium   Beta-hydroxybutyric acid   Blood gas, venous   Basic metabolic panel   Diet NPO time specified   Cardiac monitoring   Initiate Carrier Fluid Protocol   Notify physician (specify)   If present, discontinue Insulin Pump after IV Insulin is initiated.   Do NOT use lab glucose values in [employer].  If CBG meter reads "Critical High", enter 600.   IV insulin infusion with sufficient glucose should be continued until MD determines acidosis is corrected and places transition orders.   Upon IV fluid bolus completion, place order for STAT BMET  (LAB15) and call provider with results.   Consult to hospitalist   Pulse oximetry, continuous   POC CBG, ED   I-Stat beta hCG blood, ED   CBG monitoring, ED   ED EKG   Insert peripheral IV     OTHER Significant initial  Findings:  labs showing:    Recent Labs  Lab 01/11/22 1914  NA 131*  K 4.5  CO2 17*  GLUCOSE 1,403*  BUN 69*  CREATININE 3.21*  CALCIUM 8.2*  MG 2.8*    Cr    Up from baseline see below Lab Results  Component Value Date   CREATININE 3.21 (H) 01/11/2022   CREATININE 0.90 10/09/2021   CREATININE 0.90 06/14/2021    No results for input(s): AST, ALT, ALKPHOS, BILITOT, PROT, ALBUMIN in the last 168 hours. Lab Results  Component Value Date   CALCIUM 8.2 (L) 01/11/2022        Plt: Lab Results  Component Value Date   PLT 250 01/11/2022     COVID-19 Labs  No results for input(s): DDIMER, FERRITIN, LDH, CRP in the last 72 hours.  Lab Results  Component Value Date   [place] Not Detected 11/02/2019    Venous  Blood Gas result:  pH  7.28  pCO2  45  ABG    Component Value Date/Time   HCO3 21.1 01/11/2022 1914   TCO2 25 02/28/2015 2239   ACIDBASEDEF 5.7 (H) 01/11/2022 1914   O2SAT 62.6 01/11/2022 1914    Recent Labs  Lab 01/11/22 1914  WBC 9.3  NEUTROABS 6.8  HGB 15.4*  HCT 51.6*  MCV 93.3  PLT 250    HG/HCT  stable,  Component Value Date/Time   HGB 15.4 (H) 01/11/2022 1914   HGB 13.9 07/07/2020 1622   HGB 14.1 08/18/2014 1316   HCT 51.6 (H) 01/11/2022 1914   HCT 42.4 07/07/2020 1622   HCT 43.2 08/18/2014 1316   MCV 93.3 01/11/2022 1914   MCV 84 07/07/2020 1622   MCV 93.6 08/18/2014 1316       DM  labs:  HbA1C: Recent Labs    06/14/21 1437  HGBA1C 7.7*       CBG (last 3)  Recent Labs    01/11/22 1431 01/11/22 1831  GLUCAP >600* >600*          Cultures: No results found for: SDES, SPECREQUEST, CULT, REPTSTATUS   Radiological Exams on Admission: No results  found. _______________________________________________________________________________________________________ Latest   Blood pressure (!) 120/43, pulse 99, temperature 98.6 F (37 C), temperature source Oral, resp. rate (!) 22, last menstrual period 09/20/2012, SpO2 99 %.   Vitals  labs and radiology finding personally reviewed  Review of Systems:    Pertinent positives include:  , fatigue, weight loss  Polyuria polydipsia Constitutional:  No weight loss, night sweats, Fevers, chills HEENT:  No headaches, Difficulty swallowing,Tooth/dental problems,Sore throat,  No sneezing, itching, ear ache, nasal congestion, post nasal drip,  Cardio-vascular:  No chest pain, Orthopnea, PND, anasarca, dizziness, palpitations.no Bilateral lower extremity swelling  GI:  No heartburn, indigestion, abdominal pain, nausea, vomiting, diarrhea, change in bowel habits, loss of appetite, melena, blood in stool, hematemesis Resp:  no shortness of breath at rest. No dyspnea on exertion, No excess mucus, no productive cough, No non-productive cough, No coughing up of blood.No change in color of mucus.No wheezing. Skin:  no rash or lesions. No jaundice GU:  no dysuria, change in color of urine, no urgency or frequency. No straining to urinate.  No flank pain.  Musculoskeletal:  No joint pain or no joint swelling. No decreased range of motion. No back pain.  Psych:  No change in mood or affect. No depression or anxiety. No memory loss.  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   GASTROESOPHAGEAL REFLUX, NO ESOPHAGITIS 01/23/2007   Qualifier: Diagnosis of  By: Herma Ard     HLD (hyperlipidemia)    borderline   Hx of echocardiogram    Echo (9/15): EF 50-55%, normal wall motion, grade 2 diastolic dysfunction, mild LAE   Hx of radiation therapy 04/07/13- 05/14/13   pelvis 45 gray 25 fx   Hypertension    Obesity    Swelling    ANKLES - TAKES LASIX   Wrist pain 08/28/2012      Past Surgical History:  Procedure Laterality Date   ABDOMINAL HYSTERECTOMY  10/2011   complete   CHOLECYSTECTOMY N/A 07/08/2015   Procedure: LAPAROSCOPIC CHOLECYSTECTOMY WITH INTRAOPERATIVE CHOLANGIOGRAM;  Surgeon: Judeth Horn, MD;  Location: [facility];  Service: General;  Laterality: N/A;   HIATAL HERNIA REPAIR     REPAIR VAGINAL CUFF  10/07/2012   Procedure: REPAIR  VAGINAL CUFF;  Surgeon: [name], MD PHD;  Location: [facility];  Service: Gynecology;;   ROBOTIC ASSISTED TOTAL HYSTERECTOMY WITH BILATERAL SALPINGO OOPHERECTOMY  10/07/2012   Procedure: ROBOTIC ASSISTED TOTAL HYSTERECTOMY WITH BILATERAL SALPINGO OOPHORECTOMY;  Surgeon: [name], MD PHD;  Location: [facility];  Service: Gynecology;  Laterality: Bilateral;    Social History:  Ambulatory   independently       reports that she has never smoked. She has never used smokeless tobacco. She reports that she does not drink alcohol and does not use drugs.     Family History:   Family History  Problem Relation Age of Onset   Diabetes Mother    Pancreatic cancer Maternal Grandfather        or liver cancer   Hypertension Maternal Grandfather    Cancer Maternal Grandfather        pancreatic   Hypertension Maternal Grandmother    Colon polyps Maternal Aunt    Cancer Maternal Aunt        lung   Colon cancer Cousin    Cancer Cousin        lung   Lung cancer Maternal Aunt    Stroke Cousin     Heart attack Neg Hx    ______________________________________________________________________________________________ Allergies: Allergies  Allergen Reactions   Lactose Intolerance (Gi) Diarrhea     Prior to Admission medications   Medication Sig Start Date End Date Taking? Authorizing Provider  acetaminophen (TYLENOL) 500 MG tablet Take 1,000 mg by mouth every 6 (six) hours as needed for moderate pain (pain).    [provider]  carvedilol (COREG) 3.125 MG tablet Take 1 tablet (3.125 mg total) by mouth every morning AND 2 tablets (6.25 mg total) at bedtime. 11/06/21   Minus Breeding, MD  diazepam (VALIUM) 5 MG tablet TAKE 1 TABLET(5 MG) BY MOUTH EVERY 12 HOURS AS NEEDED FOR ANXIETY 12/30/21   Arby Barrette, Weldon Picking, DO  diclofenac sodium (VOLTAREN) 1 % GEL Apply 4 g topically 4 (four) times daily. 10/02/19   Tedd Sias, PA  diclofenac Sodium (VOLTAREN) 1 % GEL Apply 2 g topically 4 (four) times daily. 04/23/21   Couture, Cortni S, PA-C  fluticasone (FLONASE) 50 MCG/ACT nasal spray Place 2 sprays into both nostrils daily. 06/14/21   [name], DO  ibuprofen (ADVIL,MOTRIN) 800 MG tablet Take 1 tablet (800 mg total) by mouth every 8 (eight) hours as needed. 09/18/17   Smiley Houseman, MD  meclizine (ANTIVERT) 25 MG tablet TAKE 1 TABLET(25 MG) BY MOUTH THREE TIMES DAILY AS NEEDED FOR DIZZINESS. This will be your last refill until you are seen in the office. 11/29/21   Alcus Dad, MD  omeprazole (PRILOSEC) 20 MG capsule TAKE 1 CAPSULE(20 MG) BY MOUTH DAILY 10/25/21   Arby Barrette, [place] J, DO  polyethylene glycol powder (GLYCOLAX/MIRALAX) 17 GM/SCOOP powder Take 17 g by mouth daily as needed. 07/07/20   Patriciaann Clan, DO  sertraline (ZOLOFT) 50 MG tablet Take 1 tablet (50 mg total) by mouth daily. Start with 1/2 tablet for a few days. 06/14/21   [name], DO     ___________________________________________________________________________________________________ Physical Exam: Vitals with BMI 01/11/2022 01/11/2022 01/11/2022  Height - - -  Weight - - -  BMI - - -  Systolic 767 85 209  Diastolic 43 64 79  Pulse 99 83 101     1. General:  in No  Acute distress   Chronically ill   -appearing 2. Psychological: Alert and  Oriented 3. Head/ENT:   Dry Mucous Membranes                          Head Non traumatic, neck supple                        Poor Dentition 4. SKIN:  decreased Skin turgor,  Skin clean Dry and intact no rash 5. Heart: Regular rate and rhythm no  Murmur, no Rub or gallop 6. Lungs: no wheezes or crackles   7. Abdomen: Soft,  non-tender, Non distended   obese  bowel sounds present 8. Lower extremities: no clubbing, cyanosis, no  edema 9. Neurologically Grossly intact, moving all 4 extremities equally     10. MSK: Normal range of motion    Chart has been reviewed  ______________________________________________________________________________________________  Assessment/Plan 56 y.o. female with medical history significant of CHF, HTN HLD, Obesity  Admitted for  DKA/HHS  Present on Admission:  Essential hypertension  Sleep apnea  Chronic combined systolic and diastolic CHF, NYHA class 2 (HCC)  DKA, type 2, not at goal [facility])  Debility  AKI (acute kidney injury) ([place])     Morbid obesity with BMI of 50.0-59.9, adult ([place]) Nutritional consult as an outpatient  Chronic combined systolic and diastolic CHF, NYHA class 2 ([place]) Currently appears to be dry continue to rehydrate and follow repeat echo  Essential hypertension Restart Coreg if able to tolerate  DKA, type 2, not at goal [facility]) will admit per DKA [facility] protocol, obtain serial BMET, start on glucosestabalizer, aggressive IVF.    So far work up of possible causes of DKA/HSS with CXR, ECG one set of cardiac enzymes, UA.   Monitor in [place]. Replace potassium  as needed.      Consult diabetes coordinator     Sleep apnea Not on CPAP  Debility Family reports patient has been extra weak.  Will get PT OT assess aspirin prior to discharge.  AKI (acute kidney injury) ([place]) In the setting of hyperglycemia.  We will order urine electrolytes rehydrate follow fluid status   Other plan as per orders.  DVT prophylaxis:  SCD      Code Status:    Code Status: Prior FULL CODE    as per patient   I had personally discussed CODE STATUS with patient and family     Family Communication:   Family   at  Bedside  plan of care was discussed  with   Son,   Disposition Plan:       To home once workup is complete and patient is stable   Following barriers for discharge:                            Electrolytes corrected                                                   Would benefit from PT/OT eval prior to DC  Ordered                    Diabetes care coordinator                   Consults called:     none  Admission status:  ED Disposition     ED Disposition  Admit   Condition  --   Comment  Hospital Area: [place] [[id number]]  Level of Care: Stepdown [14]  Admit to SDU based on following criteria: Hemodynamic compromise or significant risk of instability:  Patient requiring short term acute titration and management of vasoactive drips, and invasive monitoring (i.e., CVP and Arterial line).  May place patient in observation at [facility] or [facility] if equivalent level of care is available:: No  Covid Evaluation: Asymptomatic Screening Protocol (No Symptoms)  Diagnosis: DKA, type 2, not at goal [facility]) [[id number]]  Admitting Physician: Toy Baker [[id number]]  Attending Physician: Toy Baker [[id number]]          Obs      Level of care      progressive tele indefinitely please discontinue once patient no longer qualifies COVID-19 Labs    Lab Results  Component Value Date   [place] Not Detected 11/02/2019      Precautions: admitted as  asymptomatic screening protocol      Treshun Wold 01/12/2022, 12:16 AM    [facility]     after 2 AM please page floor coverage PA If 7AM-7PM, please contact the day team taking care of the patient using [email or web address]   Patient was evaluated in the context of the global COVID-19 pandemic, which necessitated consideration that the patient might be at risk for infection with the SARS-CoV-2 virus that causes COVID-19. Institutional protocols and algorithms that pertain to the evaluation of patients at risk for COVID-19 are in a state of rapid change based on information released by regulatory bodies including the CDC and federal and state organizations. These policies and algorithms were followed during the patient's care.

## 2022-01-11 NOTE — Assessment & Plan Note (Signed)
Restart Coreg if able to tolerate

## 2022-01-11 NOTE — Assessment & Plan Note (Signed)
will admit per DKA [facility] protocol, obtain serial BMET, start on glucosestabalizer, aggressive IVF.    So far work up of possible causes of DKA/HSS with CXR, ECG one set of cardiac enzymes, UA.   Monitor in [place]. Replace potassium as needed.      Consult diabetes coordinator

## 2022-01-11 NOTE — ED Provider Notes (Addendum)
[facility] DEPT Provider Note   CSN: [id number] Arrival date & time: 01/11/22  1403     History  Chief Complaint  Patient presents with   Hyperglycemia    Deborah [name] is a 56 y.o. female.   Hyperglycemia Associated symptoms: no fever and no shortness of breath    Patient has a history of d hypertension,endometrial cancer, hyperlipidemia, obesity who presents with high blood sugar.  Patient has had issues with blurred vision, hand cramping, general weakness that started earlier today.  Patient states she has been very thirsty.  She has been drinking a lot of fluids.  She has been urinating a lot.  Patient has remained hungry.  She has not had any vomiting or diarrhea.  No fevers or chills.  She felt increasingly weak today so she came to the ED.  Patient does not have a history of diabetes that she is aware of.  Home Medications Prior to Admission medications   Medication Sig Start Date End Date Taking? Authorizing Provider  acetaminophen (TYLENOL) 500 MG tablet Take 1,000 mg by mouth every 6 (six) hours as needed for moderate pain (pain).    [provider]  carvedilol (COREG) 3.125 MG tablet Take 1 tablet (3.125 mg total) by mouth every morning AND 2 tablets (6.25 mg total) at bedtime. 11/06/21   Minus Breeding, MD  diazepam (VALIUM) 5 MG tablet TAKE 1 TABLET(5 MG) BY MOUTH EVERY 12 HOURS AS NEEDED FOR ANXIETY 12/30/21   Arby Barrette, Weldon Picking, DO  diclofenac sodium (VOLTAREN) 1 % GEL Apply 4 g topically 4 (four) times daily. 10/02/19   Tedd Sias, PA  diclofenac Sodium (VOLTAREN) 1 % GEL Apply 2 g topically 4 (four) times daily. 04/23/21   Couture, Cortni S, PA-C  fluticasone (FLONASE) 50 MCG/ACT nasal spray Place 2 sprays into both nostrils daily. 06/14/21   [name], DO  ibuprofen (ADVIL,MOTRIN) 800 MG tablet Take 1 tablet (800 mg total) by mouth every 8 (eight) hours as needed. 09/18/17   Smiley Houseman, MD  meclizine  (ANTIVERT) 25 MG tablet TAKE 1 TABLET(25 MG) BY MOUTH THREE TIMES DAILY AS NEEDED FOR DIZZINESS. This will be your last refill until you are seen in the office. 11/29/21   Alcus Dad, MD  omeprazole (PRILOSEC) 20 MG capsule TAKE 1 CAPSULE(20 MG) BY MOUTH DAILY 10/25/21   Arby Barrette, [place] J, DO  polyethylene glycol powder (GLYCOLAX/MIRALAX) 17 GM/SCOOP powder Take 17 g by mouth daily as needed. 07/07/20   Patriciaann Clan, DO  sertraline (ZOLOFT) 50 MG tablet Take 1 tablet (50 mg total) by mouth daily. Start with 1/2 tablet for a few days. 06/14/21   [name], DO      Allergies    Lactose intolerance (gi)    Review of Systems   Review of Systems  Constitutional:  Negative for fever.  Respiratory:  Negative for shortness of breath.    Physical Exam Updated Vital Signs BP (!) 120/43 (BP Location: Right Arm)    Pulse 99    Temp 98.6 F (37 C) (Oral)    Resp (!) 22    LMP 09/20/2012    SpO2 99%  Physical Exam Vitals and nursing note reviewed.  Constitutional:      Appearance: She is well-developed. She is not diaphoretic.  HENT:     Head: Normocephalic and atraumatic.     Right Ear: External ear normal.     Left Ear: External ear normal.  Mouth/Throat:     Mouth: Mucous membranes are dry.  Eyes:     General: No scleral icterus.       Right eye: No discharge.        Left eye: No discharge.     Conjunctiva/sclera: Conjunctivae normal.  Neck:     Trachea: No tracheal deviation.  Cardiovascular:     Rate and Rhythm: Regular rhythm. Tachycardia present.  Pulmonary:     Effort: Pulmonary effort is normal. No respiratory distress.     Breath sounds: Normal breath sounds. No stridor. No wheezing or rales.  Abdominal:     General: Bowel sounds are normal. There is no distension.     Palpations: Abdomen is soft.     Tenderness: There is no abdominal tenderness. There is no guarding or rebound.  Musculoskeletal:        General: No tenderness or deformity.     Cervical back:  Neck supple.  Skin:    General: Skin is warm and dry.     Findings: No rash.  Neurological:     General: No focal deficit present.     Mental Status: She is alert.     Cranial Nerves: No cranial nerve deficit (no facial droop, extraocular movements intact, no slurred speech).     Sensory: No sensory deficit.     Motor: No abnormal muscle tone or seizure activity.  Psychiatric:        Mood and Affect: Mood normal.    ED Results / Procedures / Treatments   Labs (all labs ordered are listed, but only abnormal results are displayed) Labs Reviewed  CBC WITH DIFFERENTIAL/PLATELET - Abnormal; Notable for the following components:      Result Value   RBC 5.53 (*)    Hemoglobin 15.4 (*)    HCT 51.6 (*)    MCHC 29.8 (*)    Abs Immature Granulocytes 0.09 (*)    All other components within normal limits  BASIC METABOLIC PANEL - Abnormal; Notable for the following components:   Sodium 131 (*)    Chloride 86 (*)    CO2 17 (*)    Glucose, Bld 1,403 (*)    BUN 69 (*)    Creatinine, Ser 3.21 (*)    Calcium 8.2 (*)    GFR, Estimated 17 (*)    Anion gap 28 (*)    All other components within normal limits  MAGNESIUM - Abnormal; Notable for the following components:   Magnesium 2.8 (*)    All other components within normal limits  BETA-HYDROXYBUTYRIC ACID - Abnormal; Notable for the following components:   Beta-Hydroxybutyric Acid 6.68 (*)    All other components within normal limits  BLOOD GAS, VENOUS - Abnormal; Notable for the following components:   Acid-base deficit 5.7 (*)    All other components within normal limits  CBG MONITORING, ED - Abnormal; Notable for the following components:   Glucose-Capillary >600 (*)    All other components within normal limits  CBG MONITORING, ED - Abnormal; Notable for the following components:   Glucose-Capillary >600 (*)    All other components within normal limits  RESP PANEL BY RT-PCR (FLU A&B, COVID) ARPGX2  URINALYSIS, ROUTINE W REFLEX  MICROSCOPIC  BETA-HYDROXYBUTYRIC ACID  BETA-HYDROXYBUTYRIC ACID  BASIC METABOLIC PANEL  BASIC METABOLIC PANEL  BASIC METABOLIC PANEL  BASIC METABOLIC PANEL  CK  HEPATIC FUNCTION PANEL  PHOSPHORUS  I-STAT BETA HCG BLOOD, ED ([facility], [facility], [facility] ONLY)  TROPONIN I (HIGH SENSITIVITY)  TROPONIN  I (HIGH SENSITIVITY)    EKG EKG Interpretation  Date/Time:  Thursday January 11 2022 18:41:13 EST Ventricular Rate:  101 PR Interval:  128 QRS Duration: 81 QT Interval:  371 QTC Calculation: 481 R Axis:   43 Text Interpretation: Sinus tachycardia Low voltage, precordial leads Since last tracing rate faster Confirmed by Dorie Rank [phone]) on 01/11/2022 6:44:15 PM  Radiology No results found.  Procedures .Critical Care Performed by: Dorie Rank, MD Authorized by: Dorie Rank, MD   Critical care provider statement:    Critical care time (minutes):  30   Critical care was time spent personally by me on the following activities:  Development of treatment plan with patient or surrogate, discussions with consultants, evaluation of patient's response to treatment, examination of patient, ordering and review of laboratory studies, ordering and review of radiographic studies, ordering and performing treatments and interventions, pulse oximetry, re-evaluation of patient's condition and review of old charts    Medications Ordered in ED Medications  insulin regular, human (MYXREDLIN) 100 units/ 100 mL infusion (11.5 Units/hr Intravenous New Bag/Given 01/11/22 2124)  lactated ringers infusion (has no administration in time range)  dextrose 5 % in lactated ringers infusion (has no administration in time range)  dextrose 50 % solution 0-50 mL (has no administration in time range)  potassium chloride 10 mEq in 100 mL IVPB (has no administration in time range)  sodium chloride 0.9 % bolus 500 mL (500 mLs Intravenous New Bag/Given 01/11/22 1917)  sodium chloride 0.9 % bolus 1,000 mL (1,000 mLs Intravenous New  Bag/Given 01/11/22 2126)    ED Course/ Medical Decision Making/ A&P Clinical Course as of 01/11/22 2158  Thu Jan 11, 2022  1832 Point of care care blood sugar greater than 600 [JK]  2012 CBC with Differential(!) CBC shows increased hemoglobin, new compared to previous values [JK]  2013 Blood gas, venous(!) No signs of acidosis [JK]  2263 Basic metabolic panel(!!) Metabolic panel shows glucose of 1403.  Patient's anion gap is also increased at 28 with bicarb down to 17.  Suggests DKA although degree of hyperglycemia would suggest more of a hyperosmolar syndrome [JK]  2156 Case discussed with Dr Roel Cluck regarding admission [JK]    Clinical Course User Index [JK] Dorie Rank, MD                           Medical Decision Making Amount and/or Complexity of Data Reviewed Labs: ordered. Decision-making details documented in ED Course.  Risk Prescription drug management. Decision regarding hospitalization.   DKA Patient presented to ED with complaints of weakness polyuria urea polydipsia.  Patient had notable elevation in her blood sugar.  She does not have a history of diabetes.  Patient's laboratory test show an anion gap metabolic acidosis with hyperglycemia consistent with DKA.  Patient has elevated beta hydroxybutyrate.  Patient has remained hemodynamically stable.  Patient was started on IV fluid resuscitation.  She was also started on insulin infusion.  We will need to monitor her blood sugar and metabolic panel closely.  Mental status has remained stable.  I will consult the medical service for admission and further treatment  AKI Elevated BUN and CR related to her hyperglycemia and dehydration.  IV fluid resuscitation ordered.    Final Clinical Impression(s) / ED Diagnoses Final diagnoses:  Diabetic ketoacidosis without coma associated with type 2 diabetes mellitus ([facility])  AKI (acute kidney injury) ([place])       Dorie Rank, MD 01/11/22  2158 ° °

## 2022-01-11 NOTE — Assessment & Plan Note (Signed)
Nutritional consult as an outpatient

## 2022-01-11 NOTE — Assessment & Plan Note (Signed)
Currently appears to be dry continue to rehydrate and follow repeat echo

## 2022-01-11 NOTE — ED Triage Notes (Signed)
BIBA Per EMS: Pt coming from home c/o hyperglycemia. Blurry vision, dry mouth, muscle cramps, increased urination. CBG 388  No hx of diabetes, no diabetic meds.  120/88 96HR  96% RA

## 2022-01-11 NOTE — Subjective & Objective (Signed)
Came in with fatigue, polyuria polydypsia, muscle cramps no prior hx of DM No fever no chills No CP no SOB

## 2022-01-11 NOTE — ED Notes (Signed)
Blood draw unsuccessful at this time, patient requests to draw labs when IV is inserted.

## 2022-01-12 ENCOUNTER — Observation Stay (HOSPITAL_COMMUNITY): Payer: Medicaid Other

## 2022-01-12 DIAGNOSIS — Z823 Family history of stroke: Secondary | ICD-10-CM | POA: Diagnosis not present

## 2022-01-12 DIAGNOSIS — E1110 Type 2 diabetes mellitus with ketoacidosis without coma: Secondary | ICD-10-CM | POA: Diagnosis not present

## 2022-01-12 DIAGNOSIS — R5381 Other malaise: Secondary | ICD-10-CM | POA: Diagnosis present

## 2022-01-12 DIAGNOSIS — I5031 Acute diastolic (congestive) heart failure: Secondary | ICD-10-CM | POA: Diagnosis not present

## 2022-01-12 DIAGNOSIS — Z20822 Contact with and (suspected) exposure to covid-19: Secondary | ICD-10-CM | POA: Diagnosis not present

## 2022-01-12 DIAGNOSIS — Z8542 Personal history of malignant neoplasm of other parts of uterus: Secondary | ICD-10-CM | POA: Diagnosis not present

## 2022-01-12 DIAGNOSIS — L8990 Pressure ulcer of unspecified site, unspecified stage: Secondary | ICD-10-CM | POA: Insufficient documentation

## 2022-01-12 DIAGNOSIS — Z923 Personal history of irradiation: Secondary | ICD-10-CM | POA: Diagnosis not present

## 2022-01-12 DIAGNOSIS — Z90710 Acquired absence of both cervix and uterus: Secondary | ICD-10-CM | POA: Diagnosis not present

## 2022-01-12 DIAGNOSIS — F419 Anxiety disorder, unspecified: Secondary | ICD-10-CM | POA: Diagnosis not present

## 2022-01-12 DIAGNOSIS — L89892 Pressure ulcer of other site, stage 2: Secondary | ICD-10-CM | POA: Diagnosis not present

## 2022-01-12 DIAGNOSIS — Z8249 Family history of ischemic heart disease and other diseases of the circulatory system: Secondary | ICD-10-CM | POA: Diagnosis not present

## 2022-01-12 DIAGNOSIS — Z6841 Body Mass Index (BMI) 40.0 and over, adult: Secondary | ICD-10-CM | POA: Diagnosis not present

## 2022-01-12 DIAGNOSIS — Z8371 Family history of colonic polyps: Secondary | ICD-10-CM | POA: Diagnosis not present

## 2022-01-12 DIAGNOSIS — R000 Tachycardia, unspecified: Secondary | ICD-10-CM | POA: Diagnosis not present

## 2022-01-12 DIAGNOSIS — I5042 Chronic combined systolic (congestive) and diastolic (congestive) heart failure: Secondary | ICD-10-CM | POA: Diagnosis not present

## 2022-01-12 DIAGNOSIS — Z794 Long term (current) use of insulin: Secondary | ICD-10-CM | POA: Diagnosis not present

## 2022-01-12 DIAGNOSIS — E876 Hypokalemia: Secondary | ICD-10-CM | POA: Diagnosis not present

## 2022-01-12 DIAGNOSIS — I110 Hypertensive heart disease with heart failure: Secondary | ICD-10-CM | POA: Diagnosis not present

## 2022-01-12 DIAGNOSIS — E785 Hyperlipidemia, unspecified: Secondary | ICD-10-CM | POA: Diagnosis not present

## 2022-01-12 DIAGNOSIS — F32A Depression, unspecified: Secondary | ICD-10-CM | POA: Diagnosis not present

## 2022-01-12 DIAGNOSIS — N179 Acute kidney failure, unspecified: Secondary | ICD-10-CM | POA: Diagnosis present

## 2022-01-12 DIAGNOSIS — R4182 Altered mental status, unspecified: Secondary | ICD-10-CM | POA: Diagnosis not present

## 2022-01-12 DIAGNOSIS — E8881 Metabolic syndrome: Secondary | ICD-10-CM | POA: Diagnosis not present

## 2022-01-12 DIAGNOSIS — E860 Dehydration: Secondary | ICD-10-CM | POA: Diagnosis not present

## 2022-01-12 DIAGNOSIS — Z801 Family history of malignant neoplasm of trachea, bronchus and lung: Secondary | ICD-10-CM | POA: Diagnosis not present

## 2022-01-12 DIAGNOSIS — Z800 Family history of malignant neoplasm of digestive organs: Secondary | ICD-10-CM | POA: Diagnosis not present

## 2022-01-12 DIAGNOSIS — Z833 Family history of diabetes mellitus: Secondary | ICD-10-CM | POA: Diagnosis not present

## 2022-01-12 LAB — HEPATIC FUNCTION PANEL
ALT: 19 U/L (ref 0–44)
AST: 23 U/L (ref 15–41)
Albumin: 3.3 g/dL — ABNORMAL LOW (ref 3.5–5.0)
Alkaline Phosphatase: 92 U/L (ref 38–126)
Bilirubin, Direct: 0.2 mg/dL (ref 0.0–0.2)
Indirect Bilirubin: 0.5 mg/dL (ref 0.3–0.9)
Total Bilirubin: 0.7 mg/dL (ref 0.3–1.2)
Total Protein: 8.2 g/dL — ABNORMAL HIGH (ref 6.5–8.1)

## 2022-01-12 LAB — BASIC METABOLIC PANEL
Anion gap: 24 — ABNORMAL HIGH (ref 5–15)
Anion gap: 14 (ref 5–15)
Anion gap: 15 (ref 5–15)
Anion gap: 16 — ABNORMAL HIGH (ref 5–15)
BUN: 71 mg/dL — ABNORMAL HIGH (ref 6–20)
BUN: 67 mg/dL — ABNORMAL HIGH (ref 6–20)
BUN: 71 mg/dL — ABNORMAL HIGH (ref 6–20)
BUN: 75 mg/dL — ABNORMAL HIGH (ref 6–20)
CO2: 18 mmol/L — ABNORMAL LOW (ref 22–32)
CO2: 27 mmol/L (ref 22–32)
CO2: 24 mmol/L (ref 22–32)
CO2: 21 mmol/L — ABNORMAL LOW (ref 22–32)
Calcium: 8.2 mg/dL — ABNORMAL LOW (ref 8.9–10.3)
Calcium: 8.6 mg/dL — ABNORMAL LOW (ref 8.9–10.3)
Calcium: 8.1 mg/dL — ABNORMAL LOW (ref 8.9–10.3)
Calcium: 8.2 mg/dL — ABNORMAL LOW (ref 8.9–10.3)
Chloride: 96 mmol/L — ABNORMAL LOW (ref 98–111)
Chloride: 103 mmol/L (ref 98–111)
Chloride: 103 mmol/L (ref 98–111)
Chloride: 102 mmol/L (ref 98–111)
Creatinine, Ser: 3.18 mg/dL — ABNORMAL HIGH (ref 0.44–1.00)
Creatinine, Ser: 2.73 mg/dL — ABNORMAL HIGH (ref 0.44–1.00)
Creatinine, Ser: 3.02 mg/dL — ABNORMAL HIGH (ref 0.44–1.00)
Creatinine, Ser: 3.01 mg/dL — ABNORMAL HIGH (ref 0.44–1.00)
GFR, Estimated: 17 mL/min — ABNORMAL LOW (ref >60)
GFR, Estimated: 20 mL/min — ABNORMAL LOW (ref >60)
GFR, Estimated: 18 mL/min — ABNORMAL LOW (ref >60)
GFR, Estimated: 18 mL/min — ABNORMAL LOW (ref >60)
Glucose, Bld: 940 mg/dL (ref 70–99)
Glucose, Bld: 236 mg/dL — ABNORMAL HIGH (ref 70–99)
Glucose, Bld: 302 mg/dL — ABNORMAL HIGH (ref 70–99)
Glucose, Bld: 430 mg/dL — ABNORMAL HIGH (ref 70–99)
Potassium: 3.5 mmol/L (ref 3.5–5.1)
Potassium: 3.5 mmol/L (ref 3.5–5.1)
Potassium: 3.4 mmol/L — ABNORMAL LOW (ref 3.5–5.1)
Potassium: 3.1 mmol/L — ABNORMAL LOW (ref 3.5–5.1)
Sodium: 138 mmol/L (ref 135–145)
Sodium: 144 mmol/L (ref 135–145)
Sodium: 142 mmol/L (ref 135–145)
Sodium: 139 mmol/L (ref 135–145)

## 2022-01-12 LAB — ECHOCARDIOGRAM COMPLETE
AR max vel: 1.62 cm2
AV Area VTI: 1.62 cm2
AV Area mean vel: 1.43 cm2
AV Mean grad: 2 mmHg
AV Peak grad: 3.3 mmHg
Ao pk vel: 0.9 m/s
Area-P 1/2: 3.34 cm2
Calc EF: 47.2 %
S' Lateral: 2.8 cm
Single Plane A2C EF: 55.6 %
Single Plane A4C EF: 38 %
Weight: 4310.43 oz

## 2022-01-12 LAB — HIV ANTIBODY (ROUTINE TESTING W REFLEX): HIV Screen 4th Generation wRfx: NONREACTIVE

## 2022-01-12 LAB — RESP PANEL BY RT-PCR (FLU A&B, COVID) ARPGX2
Influenza A by PCR: NEGATIVE
Influenza B by PCR: NEGATIVE
SARS Coronavirus 2 by RT PCR: NEGATIVE

## 2022-01-12 LAB — GLUCOSE, CAPILLARY
Glucose-Capillary: 206 mg/dL — ABNORMAL HIGH (ref 70–99)
Glucose-Capillary: 209 mg/dL — ABNORMAL HIGH (ref 70–99)
Glucose-Capillary: 218 mg/dL — ABNORMAL HIGH (ref 70–99)
Glucose-Capillary: 225 mg/dL — ABNORMAL HIGH (ref 70–99)
Glucose-Capillary: 274 mg/dL — ABNORMAL HIGH (ref 70–99)
Glucose-Capillary: 332 mg/dL — ABNORMAL HIGH (ref 70–99)
Glucose-Capillary: 336 mg/dL — ABNORMAL HIGH (ref 70–99)
Glucose-Capillary: 337 mg/dL — ABNORMAL HIGH (ref 70–99)
Glucose-Capillary: 388 mg/dL — ABNORMAL HIGH (ref 70–99)
Glucose-Capillary: 415 mg/dL — ABNORMAL HIGH (ref 70–99)
Glucose-Capillary: 433 mg/dL — ABNORMAL HIGH (ref 70–99)
Glucose-Capillary: 456 mg/dL — ABNORMAL HIGH (ref 70–99)
Glucose-Capillary: 549 mg/dL (ref 70–99)
Glucose-Capillary: >600 mg/dL (ref 70–99)
Glucose-Capillary: >600 mg/dL (ref 70–99)

## 2022-01-12 LAB — TSH: TSH: 0.094 u[IU]/mL — ABNORMAL LOW (ref 0.350–4.500)

## 2022-01-12 LAB — BETA-HYDROXYBUTYRIC ACID
Beta-Hydroxybutyric Acid: 0.06 mmol/L (ref 0.05–0.27)
Beta-Hydroxybutyric Acid: 1.75 mmol/L — ABNORMAL HIGH (ref 0.05–0.27)
Beta-Hydroxybutyric Acid: 4.08 mmol/L — ABNORMAL HIGH (ref 0.05–0.27)

## 2022-01-12 LAB — TROPONIN I (HIGH SENSITIVITY): Troponin I (High Sensitivity): 15 ng/L (ref <18)

## 2022-01-12 LAB — PHOSPHORUS: Phosphorus: 5 mg/dL — ABNORMAL HIGH (ref 2.5–4.6)

## 2022-01-12 LAB — MRSA NEXT GEN BY PCR, NASAL: MRSA by PCR Next Gen: NOT DETECTED

## 2022-01-12 LAB — CK: Total CK: 112 U/L (ref 38–234)

## 2022-01-12 MED ORDER — POTASSIUM CHLORIDE 20 MEQ PO PACK
40.0000 meq | PACK | Freq: Once | ORAL | Status: AC
  Administered 2022-01-12: 40 meq via ORAL
  Filled 2022-01-12: qty 2

## 2022-01-12 MED ORDER — INSULIN GLARGINE-YFGN 100 UNIT/ML ~~LOC~~ SOLN
25.0000 [IU] | Freq: Every day | SUBCUTANEOUS | Status: DC
Start: 2022-01-13 — End: 2022-01-13
  Filled 2022-01-12: qty 0.25

## 2022-01-12 MED ORDER — INSULIN ASPART 100 UNIT/ML IJ SOLN
8.0000 [IU] | Freq: Once | INTRAMUSCULAR | Status: AC
  Administered 2022-01-12: 8 [IU] via SUBCUTANEOUS

## 2022-01-12 MED ORDER — INSULIN ASPART 100 UNIT/ML IJ SOLN
0.0000 [IU] | Freq: Three times a day (TID) | INTRAMUSCULAR | Status: DC
  Administered 2022-01-12: 11 [IU] via SUBCUTANEOUS
  Administered 2022-01-12: 7 [IU] via SUBCUTANEOUS
  Administered 2022-01-13: 9 [IU] via SUBCUTANEOUS

## 2022-01-12 MED ORDER — LIVING WELL WITH DIABETES BOOK
Freq: Once | Status: AC
  Filled 2022-01-12 (×2): qty 1

## 2022-01-12 MED ORDER — INSULIN GLARGINE-YFGN 100 UNIT/ML ~~LOC~~ SOLN
10.0000 [IU] | Freq: Every day | SUBCUTANEOUS | Status: DC
  Administered 2022-01-12: 10 [IU] via SUBCUTANEOUS
  Filled 2022-01-12: qty 0.1

## 2022-01-12 MED ORDER — DEXTROSE IN LACTATED RINGERS 5 % IV SOLN: INTRAVENOUS | Status: DC

## 2022-01-12 NOTE — Assessment & Plan Note (Signed)
In the setting of hyperglycemia.  We will order urine electrolytes rehydrate follow fluid status

## 2022-01-12 NOTE — Assessment & Plan Note (Signed)
Needs PT and OT evaluation.

## 2022-01-12 NOTE — Hospital Course (Addendum)
This 56 years old female with PMH significant for CHF, hypertension, hyperlipidemia, morbid obesity presented in the ED with complaints of generalized fatigue, polyuria, polydipsia,  muscle cramps.  She denies any prior history of diabetes.  Labs in the ED consistent with diabetic ketoacidosis.  Blood glucose 1403, BUN 69,  creatinine 3.21, sodium 131, beta hydroxybutyric acid 6.68.  Patient was admitted for diabetic ketoacidosis, HHS.  She was started on insulin gtt. as per DKA protocol, gentle IV hydration,  given history of CHF.  Anion gap has closed, BHBA has normalized.  She was transitioned to [facility] 25 units and sliding scale.  Diabetic coordinator consulted. She does have significant insulin resistance.  Insulin dosage was increased as per blood sugars.  Finally we were successful to control her blood sugar with Levemir 35 units twice daily, NovoLog 12 units 3 times daily.  Patient was explained in detail about compliance with medications and regular follow-ups.  She feels better has participated in physical therapy recommended home PT.  Patient wants to be discharged,  Patient is being discharged home.

## 2022-01-12 NOTE — Plan of Care (Signed)
  Problem: Education: Goal: Knowledge of General Education information will improve Description Including pain rating scale, medication(s)/side effects and non-pharmacologic comfort measures Outcome: Progressing   

## 2022-01-12 NOTE — Evaluation (Signed)
Occupational Therapy Evaluation Patient Details Name: Deborah [name] MRN: [id number] DOB: Aug 21, 1966 Today's Date: 01/12/2022   History of Present Illness Patient is a 56 year old female admitted with DKA. PMH: CHF, HTN HLD, Obesity   Clinical Impression   Patient lives home alone in apartment and is typically independent with BADLs, does not use AD for ambulation. Currently patient reports fatigue, overall supervision level for ambulation to/from bathroom. Patient initially used walker for steadying assistance but was able to ambulate back to bed without it. With encouragement patient able to perform peri care in standing at toilet. Anticipate patient will progress well during acute hospitalization and will not have OT needs at D/C. Acute OT to follow.      Recommendations for follow up therapy are one component of a multi-disciplinary discharge planning process, led by the attending physician.  Recommendations may be updated based on patient status, additional functional criteria and insurance authorization.   Follow Up Recommendations  No OT follow up    Assistance Recommended at Discharge PRN  Patient can return home with the following A little help with walking and/or transfers;A little help with bathing/dressing/bathroom;Assist for transportation    Functional Status Assessment  Patient has had a recent decline in their functional status and demonstrates the ability to make significant improvements in function in a reasonable and predictable amount of time.  Equipment Recommendations  None recommended by OT       Precautions / Restrictions Precautions Precautions: Fall Restrictions Weight Bearing Restrictions: No      Mobility Bed Mobility Overal bed mobility: Needs Assistance Bed Mobility: Supine to Sit, Sit to Supine     Supine to sit: Min assist Sit to supine: Mod assist   General bed mobility comments: Significantly increased time to sit up to edge of bed with min  A for trunk support. Mod A to lift legs back onto bed        Balance Overall balance assessment: Mild deficits observed, not formally tested                                         ADL either performed or assessed with clinical judgement   ADL Overall ADL's : Needs assistance/impaired     Grooming: Set up;Sitting   Upper Body Bathing: Set up;Sitting   Lower Body Bathing: Moderate assistance;Sitting/lateral leans;Sit to/from stand   Upper Body Dressing : Set up;Sitting   Lower Body Dressing: Moderate assistance;Sitting/lateral leans;Sit to/from stand   Toilet Transfer: Supervision/safety;Ambulation;Regular Glass blower/designer Details (indicate cue type and reason): Patient initially used rolling walker to ambulate to bathroom, did not use ambulating back to bed. Overall supervision level for safety as patient is mildly unsteady Toileting- Water quality scientist and Hygiene: Supervision/safety;Sit to/from stand Toileting - Clothing Manipulation Details (indicate cue type and reason): to perform peri care after voiding     Functional mobility during ADLs: Supervision/safety General ADL Comments: Patient needing encouragement to perform self care tasks reports feeling tired and having difficulty due to so many wires/IV tubing. 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   Lower Extremity Assessment Lower Extremity Assessment: Defer to PT evaluation  Communication Communication Communication: No difficulties   Cognition Arousal/Alertness: Awake/alert Behavior During Therapy: WFL for tasks assessed/performed Overall Cognitive Status: Within Functional  Limits for tasks assessed                                                  Home Living Family/patient expects to be discharged to:: Private residence Living Arrangements: Alone Available Help at Discharge: Family Type of Home: Apartment Home Access: Level entry     Home Layout: One level     Bathroom Shower/Tub: Teacher, early years/pre: [place]: None          Prior Functioning/Environment Prior Level of Function : Independent/Modified Independent               ADLs Comments: Does not drive, has transportation assistance or son when off of work drives patient        OT Problem List: Decreased activity tolerance;Impaired balance (sitting and/or standing);Obesity      OT Treatment/Interventions: Self-care/ADL training;Balance training;Patient/family education;Therapeutic activities;Therapeutic exercise    OT Goals(Current goals can be found in the care plan section) Acute Rehab OT Goals Patient Stated Goal: to eat OT Goal Formulation: With patient Time For Goal Achievement: 01/26/22 Potential to Achieve Goals: Good  OT Frequency: Min 2X/week    Co-evaluation PT/OT/SLP Co-Evaluation/Treatment: Yes Reason for Co-Treatment: For patient/therapist safety;To address functional/ADL transfers PT goals addressed during session: Mobility/safety with mobility OT goals addressed during session: ADL's and self-care      AM-PAC OT "6 Clicks" Daily Activity     Outcome Measure Help from another person eating meals?: None Help from another person taking care of personal grooming?: A Little Help from another person toileting, which includes using toliet, bedpan, or urinal?: A Little Help from another person bathing (including washing, rinsing, drying)?: A Lot Help from another person to put on and taking off regular upper body clothing?: A Little Help from another person to put on and taking off regular lower body  clothing?: A Lot 6 Click Score: 17   End of Session Equipment Utilized During Treatment: Rolling walker (2 wheels) Nurse Communication: Mobility status  Activity Tolerance: Patient limited by fatigue Patient left: in bed;with call bell/phone within reach  OT Visit Diagnosis: Unsteadiness on feet (R26.81);Other abnormalities of gait and mobility (R26.89)                Time: 1027-1050 OT Time Calculation (min): 23 min Charges:  OT General Charges $OT Visit: 1 Visit OT Evaluation $OT Eval Low Complexity: [place] OT OT pager: [place] 01/12/2022, 1:45 PM

## 2022-01-12 NOTE — Progress Notes (Signed)
MD Dwyane Dee notified of CBG reading of 415 out of sliding scale coverage checked prior to dinner meal. Verbal order for 2 units of insulin in addition to top of sliding scale 9 units.   This RN provided ongoing education relating to blood sugar checking and insulin administration. Patient refuses at this time to check her own blood sugar with staff assistance. Patient agreeable to self administer insulin to abdominal tissue. At this time patient unable to verbalize to staff why blood sugar checks are needed and the difference between checking blood sugars and administering insulin. Patient unable to verbalize a normal blood sugar range and unable to identify why a CBG reading of 415 is problematic. Patient unable to verbalize to RN the importance of carbohydrate consideration in diet choices. RN and NT bedside providing ongoing education. Son was bedside and very helpful at noon meal but patient lives alone at this time and says she plans to discharge home.

## 2022-01-12 NOTE — Assessment & Plan Note (Addendum)
Patient presented with polyuria, polydipsia, generalized fatigue, blood glucose 1408, BHBA 6.68 consistent with DKA. Patient started on DKA protocol, IV hydration, insulin gtt. Anion gap has  closed, BHBA normalized. Started on carb modified diet, transitioned on subcu insulin.  Consulted diabetic coordinator. Her blood sugar has been fluctuating , Initaited on Semglee 30 units in the morning and 15 at bedtime. Sliding scale changed to resistant scale.  Continues to have persistently elevated blood sugars. Increase Semglee to 35 units twice daily, Novolog 8 units with meals , Continue resistant sliding scale. Continue to monitor fingersticks and serum electrolytes. FS 175 - 156- 167

## 2022-01-12 NOTE — Assessment & Plan Note (Signed)
Patient denies using CPAP in the past.

## 2022-01-12 NOTE — Progress Notes (Addendum)
Inpatient Diabetes Program Recommendations  AACE/ADA: New Consensus Statement on Inpatient Glycemic Control (2015)  Target Ranges:  Prepandial:   less than 140 mg/dL      Peak postprandial:   less than 180 mg/dL (1-2 hours)      Critically ill patients:  140 - 180 mg/dL   Lab Results  Component Value Date   GLUCAP 209 (H) 01/12/2022   HGBA1C 7.7 (H) 06/14/2021    Review of Glycemic Control  Latest Reference Range & Units 01/12/22 04:01 01/12/22 05:02 01/12/22 06:13 01/12/22 07:41 01/12/22 09:03  Glucose-Capillary 70 - 99 mg/dL 337 (H) 336 (H) 274 (H) 206 (H) 209 (H)   Diabetes history: DM 2-NEW DIAGNOSIS Outpatient Diabetes medications: None Current orders for Inpatient glycemic control:  IV insulin  Inpatient Diabetes Program Recommendations:    Upon transition off insulin drip, consider Semglee 25 units 2 hours prior to d/c of insulin drip.   .  Will speak with patient today and provide basic education regarding use of insulin pen, etc.    Thanks  Adah Perl, RN, BC-ADM Inpatient Diabetes Coordinator Pager [phone]  (8a-5p)  Addendum:  Spoke with pt and her son about new diagnosis.  Explained what an A1C is, basic pathophysiology of DM Type 2, basic home care, importance of checking CBGs and maintaining good CBG control to prevent long-term and short-term complications.  Reviewed signs and symptoms of hyperglycemia and hypoglycemia.  RNs to provide ongoing basic DM education at bedside with this patient.  Have ordered educational booklet, insulin starter kit, and DM videos.    Patient is afraid of needles. Educated patient and son on insulin pen use at home.  Reviewed all steps if insulin pen including attachment of needle, 2-unit air shot, dialing up dose, giving injection, removing needle, disposal of sharps, storage of unused insulin, disposal of insulin etc.  Son able to provide successful return demonstration however patient did not want to touch insulin pen.  Also  reviewed troubleshooting with insulin pen.  MD to give patient Rxs for insulin pens and insulin pen needles.     Son wants patient to come home with him so he can help her however she wants to go home.  I explained to patient that she will need to be able to independently administer insulin, check blood sugars, and care for self.  Asked RN to allow patient to check blood sugars and administer insulin as much as possible.  A1C is pending, however she will likely need insulin based on admit blood sugar. GFR estimate is 20 therefore would not be a candidate for metformin at this time.   At D/C patient will need Rx. For:  1) Lantus solostar pen [phone]) 2) Novolog flexpen (#[id number]) 3) Insulin pen needles (#[id number]) 4) Glucose meter kit (#[id number])  Thanks,

## 2022-01-12 NOTE — Assessment & Plan Note (Signed)
Family reports patient has been extra weak.  Will get PT OT assess aspirin prior to discharge.

## 2022-01-12 NOTE — Assessment & Plan Note (Signed)
Diet and exercise discussed in detail.

## 2022-01-12 NOTE — Assessment & Plan Note (Addendum)
Hold Coreg as BP is soft.

## 2022-01-12 NOTE — Progress Notes (Signed)
° °  Echocardiogram 2D Echocardiogram has been performed.  Deborah [name] 01/12/2022, 11:49 AM

## 2022-01-12 NOTE — Assessment & Plan Note (Addendum)
She appeared dehydrated, does not appear volume overloaded. Continue IV hydration, remain conscious of history of CHF. Last LVEF 45 to 50% in 2019. Repeat echo shows LVEF 55- 60%.

## 2022-01-12 NOTE — Evaluation (Signed)
Physical Therapy Evaluation Patient Details Name: Deborah [name] MRN: [id number] DOB: 06-11-1966 Today's Date: 01/12/2022  History of Present Illness  Patient is a 56 year old female admitted with DKA. PMH: CHF, HTN HLD, Obesity, right hand gongenital deformity, endometrial cancer  Clinical Impression  The patient  moves slowly but  does mobilize. Ambulated with Rw for x 10' then did not use. Tended to  support on objects. Patient resides alone. Family available PRN.  Patient should progress well to return home. Pt admitted with above diagnosis.  Pt currently with functional limitations due to the deficits listed below (see PT Problem List). Pt will benefit from skilled PT to increase their independence and safety with mobility to allow discharge to the venue listed below.         Recommendations for follow up therapy are one component of a multi-disciplinary discharge planning process, led by the attending physician.  Recommendations may be updated based on patient status, additional functional criteria and insurance authorization.  Follow Up Recommendations Home health PT    Assistance Recommended at Discharge Intermittent Supervision/Assistance  Patient can return home with the following  A little help with walking and/or transfers;Help with stairs or ramp for entrance;Assist for transportation;A little help with bathing/dressing/bathroom    Equipment Recommendations Rolling walker (2 wheels)  Recommendations for Other Services       Functional Status Assessment Patient has had a recent decline in their functional status and demonstrates the ability to make significant improvements in function in a reasonable and predictable amount of time.     Precautions / Restrictions Precautions Precautions: Fall Restrictions Weight Bearing Restrictions: No      Mobility  Bed Mobility                    Transfers Overall transfer level: Needs assistance Equipment used:  Rolling walker (2 wheels) Transfers: Sit to/from Stand Sit to Stand: Min guard                Ambulation/Gait Ambulation/Gait assistance: Min guard Gait Distance (Feet): 20 Feet (x 2) Assistive device: Rolling walker (2 wheels) Gait Pattern/deviations: Step-through pattern Gait velocity: decr     General Gait Details: statred  ambultion with Rw, then put it asisde as she was  in a rush to get to BR. Ambulated back to bed, held counter and foot board.  Stairs            Wheelchair Mobility    Modified Rankin (Stroke Patients Only)       Balance Overall balance assessment: Mild deficits observed, not formally tested                                           Pertinent Vitals/Pain Pain Assessment Faces Pain Scale: No hurt    Home Living Family/patient expects to be discharged to:: Private residence Living Arrangements: Alone Available Help at Discharge: Available PRN/intermittently Type of Home: Apartment Home Access: Level entry       Home Layout: One level Home Equipment: None      Prior Function Prior Level of Function : Independent/Modified Independent               ADLs Comments: Does not drive, has transportation assistance or son when off of work drives patient     Hand Dominance   Dominant Hand:  (did not specify)  Extremity/Trunk Assessment   Upper Extremity Assessment Upper Extremity Assessment: RUE deficits/detail 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,    Lower Extremity Assessment Lower Extremity Assessment: Generalized weakness    Cervical / Trunk Assessment Cervical / Trunk Assessment: Normal  Communication   Communication: No difficulties  Cognition Arousal/Alertness: Awake/alert Behavior During Therapy: WFL for tasks assessed/performed Overall Cognitive Status: Within Functional Limits for tasks assessed                                          General Comments       Exercises     Assessment/Plan    PT Assessment Patient needs continued PT services  PT Problem List Decreased strength;Decreased mobility;Decreased activity tolerance;Decreased knowledge of use of DME       PT Treatment Interventions DME instruction;Therapeutic activities;Gait training;Therapeutic exercise;Patient/family education;Functional mobility training    PT Goals (Current goals can be found in the Care Plan section)  Acute Rehab PT Goals Patient Stated Goal: to go to her home PT Goal Formulation: With patient/family Time For Goal Achievement: 01/26/22 Potential to Achieve Goals: Good    Frequency Min 3X/week     Co-evaluation PT/OT/SLP Co-Evaluation/Treatment: Yes Reason for Co-Treatment: For patient/therapist safety PT goals addressed during session: Mobility/safety with mobility OT goals addressed during session: ADL's and self-care       AM-PAC PT "6 Clicks" Mobility  Outcome Measure Help needed turning from your back to your side while in a flat bed without using bedrails?: A Little Help needed moving from lying on your back to sitting on the side of a flat bed without using bedrails?: A Little Help needed moving to and from a bed to a chair (including a wheelchair)?: A Little Help needed standing up from a chair using your arms (e.g., wheelchair or bedside chair)?: A Little Help needed to walk in hospital room?: A Little Help needed climbing 3-5 steps with a railing? : A Lot 6 Click Score: 17    End of Session   Activity Tolerance: Patient tolerated treatment well Patient left: in bed;with call bell/phone within reach Nurse Communication: Mobility status PT Visit Diagnosis: Unsteadiness on feet (R26.81)    Time: 1030-1050 PT Time Calculation (min) (ACUTE ONLY): 20 min   Charges:   PT Evaluation $PT Eval Low Complexity: [place] Pager [phone] Office [phone]   Claretha Cooper 01/12/2022, 3:36 PM

## 2022-01-12 NOTE — Progress Notes (Signed)
Nutrition Note  RD consulted for nutrition education regarding diabetes.   Lab Results  Component Value Date   HGBA1C 7.7 (H) 06/14/2021    RD provided "Carbohydrate Counting for People with Diabetes" and "Plate Method for Diabetes" handout from the Academy of [employer] and Dietetics. Discussed different food groups and their effects on blood sugar, emphasizing carbohydrate-containing foods. Provided list of carbohydrates and recommended serving sizes of common foods.  Discussed importance of controlled and consistent carbohydrate intake throughout the day. Provided examples of ways to balance meals/snacks and encouraged intake of high-fiber, whole grain complex carbohydrates. Teach back method used.  Expect good compliance. Will have family support at home. Son was present for education.  Pt states she typically doesn't eat breakfast. Eats a lot of take out, son reports fast food Deborah [name]). Drinks a lot of soda, Coke and ginger ale. Encouraged unsweetened beverages.  Body mass index is 44.83 kg/m. Pt meets criteria for morbid obesity based on current BMI.  Current diet order is CHO modified, patient is consuming approximately 10% of meals at this time. Labs and medications reviewed. No further nutrition interventions warranted at this time.  If additional nutrition issues arise, please re-consult RD.  Deborah Bibles, MS, RD, LDN Inpatient Clinical Dietitian Contact information available via Amion

## 2022-01-12 NOTE — Progress Notes (Addendum)
Progress Note   Patient: Deborah [name] ZOX:[id number] DOB: [birth year] DOA: 01/11/2022     0  DOS: the patient was seen and examined on 01/12/2022   Brief hospital course: This 56 years old female with PMH significant for CHF, hypertension, hyperlipidemia, morbid obesity presented in the ED with complaints of generalized fatigue, polyuria, polydipsia muscle cramps.  She denies any prior history of diabetes.  Labs in the ED consistent with diabetic ketoacidosis.  Blood glucose 1403, BUN 69 creatinine 3.21, sodium 131, beta hydroxybutyric acid 6.68.  Patient was admitted for diabetic ketoacidosis, HHS.  She was started on insulin gtt. as per DKA protocol, gentle IV hydration given history of CHF.  Anion gap is now closed, BHBA has normalized.  She is transitioned to [facility] 25 units and sliding scale.  Diabetic coordinator consulted.  Assessment and Plan: * DKA, type 2, not at goal [facility])- (present on admission) Patient presented with polyuria, polydipsia, generalized fatigue, blood glucose 1408, BHBA 6.68 consistent with DKA. Patient started on DKA protocol, IV hydration, insulin gtt. Anion gap has now closed, BHBA normalized. Started on carb modified diet, transitioned on subcu insulin. Continue to monitor serum electrolytes. Consult diabetic coordinator.  AKI (acute kidney injury) ([place])- (present on admission) Baseline serum creatinine normal, presented with serum creatinine 3.72 Likely in the setting of DKA.  Avoid nephrotoxic medications. Continue IV hydration.  Creatinine improving 3.21> 3.18>2.70 Renal ultrasound without obstruction.  Chronic combined systolic and diastolic CHF, NYHA class 2 ([place])- (present on admission) She appears dehydrated, does not appear volume overloaded. Continue IV hydration, remain conscious of history of CHF. Last LVEF 45 to 50% in 2019. Repeat 2D echocardiogram.  Debility- (present on admission) Needs PT and OT evaluation.  Essential hypertension-  (present on admission) Hold Coreg as blood pressure is on the soft side.  Sleep apnea- (present on admission) Patient denies using CPAP in the past.  Morbid obesity with BMI of 50.0-59.9, adult ([place]) Diet and exercise discussed in detail.   Pressure Injury 01/12/22 Groin Right;Left;Proximal Stage 2 -  Partial thickness loss of dermis presenting as a shallow open injury with a red, pink wound bed without slough. x2 on inner L thigh, x2 on inner R thigh (Active)  01/12/22 0000  Location: Groin  Location Orientation: Right;Left;Proximal  Staging: Stage 2 -  Partial thickness loss of dermis presenting as a shallow open injury with a red, pink wound bed without slough.  Wound Description (Comments): x2 on inner L thigh, x2 on inner R thigh  Present on Admission: Yes    Subjective: Patient was seen and examined at bedside.  Overnight events noted. Patient reports feeling better, She denies any history of diabetes in the past. She reports feeling hungry and asking for food.  Denies any nausea and vomiting.  Physical Exam: Vitals:   01/12/22 0415 01/12/22 0805 01/12/22 0900 01/12/22 1000  BP:  (!) 119/103 (!) 92/56 (!) 76/55  Pulse:  88 (!) 41 85  Resp:   18 (!) 28  Temp: 97.7 F (36.5 C)     TempSrc: Axillary     SpO2:   98% 95%  Weight:       Physical Exam Vitals and nursing note reviewed.  Constitutional:      General: She is not in acute distress.    Appearance: Normal appearance. She is obese.  HENT:     Head: Normocephalic and atraumatic.  Eyes:     Conjunctiva/sclera: Conjunctivae normal.  Cardiovascular:     Rate and  Rhythm: Normal rate and regular rhythm.     Heart sounds: Normal heart sounds.  Pulmonary:     Effort: Pulmonary effort is normal.     Breath sounds: Normal breath sounds.  Abdominal:     General: Abdomen is flat. Bowel sounds are normal.     Palpations: Abdomen is soft.  Skin:    General: Skin is warm and dry.  Neurological:     General: No focal  deficit present.     Mental Status: She is alert and oriented to person, place, and time. Mental status is at baseline.  Psychiatric:        Mood and Affect: Mood normal.        Behavior: Behavior normal.     Data Reviewed: I have Reviewed nursing notes, Vitals, and Lab results since pt's last encounter. Pertinent lab results CBC, BMP, BHB I have ordered test including CBC, BMP I have discussed pt's care plan and test results with patient.   Family Communication: Spoke with son at bedside.  Disposition: Status is: Observation The patient remains OBS appropriate and will d/c before 2 midnights.  Admitted for diabetic ketoacidosis and AKI requiring IV hydration and insulin gtt. as per DKA protocol.     Planned Discharge Destination: Home   Time spent: 50 minutes  Author: [name], MD 01/12/2022 10:43 AM  For on call review [email or web address].

## 2022-01-12 NOTE — Assessment & Plan Note (Addendum)
Baseline serum creatinine normal, presented with serum creatinine 3.72 Likely in the setting of DKA.  Avoid nephrotoxic medications. Continue IV hydration.  Creatinine improving 3.21> 3.18>2.70>1.57>1.21>1.20 Renal ultrasound without obstruction.

## 2022-01-13 DIAGNOSIS — E1110 Type 2 diabetes mellitus with ketoacidosis without coma: Secondary | ICD-10-CM | POA: Diagnosis not present

## 2022-01-13 LAB — CBC
HCT: 45.2 % (ref 36.0–46.0)
Hemoglobin: 15 g/dL (ref 12.0–15.0)
MCH: 28.2 pg (ref 26.0–34.0)
MCHC: 33.2 g/dL (ref 30.0–36.0)
MCV: 85 fL (ref 80.0–100.0)
Platelets: 198 10*3/uL (ref 150–400)
RBC: 5.32 MIL/uL — ABNORMAL HIGH (ref 3.87–5.11)
RDW: 14.4 % (ref 11.5–15.5)
WBC: 13.9 10*3/uL — ABNORMAL HIGH (ref 4.0–10.5)
nRBC: 0 % (ref 0.0–0.2)

## 2022-01-13 LAB — GLUCOSE, CAPILLARY
Glucose-Capillary: 485 mg/dL — ABNORMAL HIGH (ref 70–99)
Glucose-Capillary: 565 mg/dL (ref 70–99)
Glucose-Capillary: 517 mg/dL (ref 70–99)
Glucose-Capillary: 437 mg/dL — ABNORMAL HIGH (ref 70–99)
Glucose-Capillary: 377 mg/dL — ABNORMAL HIGH (ref 70–99)

## 2022-01-13 LAB — BASIC METABOLIC PANEL
Anion gap: 14 (ref 5–15)
BUN: 72 mg/dL — ABNORMAL HIGH (ref 6–20)
CO2: 27 mmol/L (ref 22–32)
Calcium: 8.7 mg/dL — ABNORMAL LOW (ref 8.9–10.3)
Chloride: 101 mmol/L (ref 98–111)
Creatinine, Ser: 2.7 mg/dL — ABNORMAL HIGH (ref 0.44–1.00)
GFR, Estimated: 20 mL/min — ABNORMAL LOW (ref >60)
Glucose, Bld: 382 mg/dL — ABNORMAL HIGH (ref 70–99)
Potassium: 3.3 mmol/L — ABNORMAL LOW (ref 3.5–5.1)
Sodium: 142 mmol/L (ref 135–145)

## 2022-01-13 LAB — OSMOLALITY, URINE: Osmolality, Ur: 614 mOsm/kg (ref 300–900)

## 2022-01-13 LAB — CREATININE, URINE, RANDOM: Creatinine, Urine: 158.25 mg/dL

## 2022-01-13 LAB — SODIUM, URINE, RANDOM: Sodium, Ur: 18 mmol/L

## 2022-01-13 LAB — MAGNESIUM: Magnesium: 2.5 mg/dL — ABNORMAL HIGH (ref 1.7–2.4)

## 2022-01-13 LAB — PHOSPHORUS: Phosphorus: 3.6 mg/dL (ref 2.5–4.6)

## 2022-01-13 MED ORDER — INSULIN ASPART 100 UNIT/ML IJ SOLN: 0.0000 [IU] | Freq: Three times a day (TID) | INTRAMUSCULAR | Status: DC

## 2022-01-13 MED ORDER — INSULIN ASPART 100 UNIT/ML IJ SOLN
0.0000 [IU] | Freq: Three times a day (TID) | INTRAMUSCULAR | Status: DC
  Administered 2022-01-13 – 2022-01-14 (×3): 20 [IU] via SUBCUTANEOUS
  Administered 2022-01-14: 15 [IU] via SUBCUTANEOUS
  Administered 2022-01-14: 20 [IU] via SUBCUTANEOUS
  Administered 2022-01-15 (×3): 4 [IU] via SUBCUTANEOUS
  Administered 2022-01-16: 7 [IU] via SUBCUTANEOUS
  Administered 2022-01-16: 4 [IU] via SUBCUTANEOUS
  Administered 2022-01-16: 3 [IU] via SUBCUTANEOUS

## 2022-01-13 MED ORDER — INSULIN GLARGINE-YFGN 100 UNIT/ML ~~LOC~~ SOLN
30.0000 [IU] | Freq: Every day | SUBCUTANEOUS | Status: DC
  Administered 2022-01-13 – 2022-01-14 (×2): 30 [IU] via SUBCUTANEOUS
  Filled 2022-01-13 (×2): qty 0.3

## 2022-01-13 MED ORDER — INSULIN GLARGINE-YFGN 100 UNIT/ML ~~LOC~~ SOLN
15.0000 [IU] | Freq: Every day | SUBCUTANEOUS | Status: DC
  Administered 2022-01-13: 15 [IU] via SUBCUTANEOUS
  Filled 2022-01-13: qty 0.15

## 2022-01-13 MED ORDER — POTASSIUM CHLORIDE 20 MEQ PO PACK
40.0000 meq | PACK | Freq: Once | ORAL | Status: AC
  Administered 2022-01-13: 40 meq via ORAL
  Filled 2022-01-13: qty 2

## 2022-01-13 MED ORDER — MECLIZINE HCL 25 MG PO TABS
25.0000 mg | ORAL_TABLET | Freq: Two times a day (BID) | ORAL | Status: DC
  Administered 2022-01-13 – 2022-01-16 (×7): 25 mg via ORAL
  Filled 2022-01-13 (×8): qty 1

## 2022-01-13 NOTE — Progress Notes (Addendum)
Progress Note   Patient: Deborah [name] NIO:[id number] DOB: [birth year] DOA: 01/11/2022     1  DOS: the patient was seen and examined on 01/13/2022   Brief hospital course: This 56 years old female with PMH significant for CHF, hypertension, hyperlipidemia, morbid obesity presented in the ED with complaints of generalized fatigue, polyuria, polydipsia muscle cramps.  She denies any prior history of diabetes.  Labs in the ED consistent with diabetic ketoacidosis.  Blood glucose 1403, BUN 69 creatinine 3.21, sodium 131, beta hydroxybutyric acid 6.68.  Patient was admitted for diabetic ketoacidosis, HHS.  She was started on insulin gtt. as per DKA protocol, gentle IV hydration given history of CHF.  Anion gap is now closed, BHBA has normalized.  She is transitioned to [facility] 25 units and sliding scale.  Diabetic coordinator consulted.  Assessment and Plan: * DKA, type 2, not at goal [facility])- (present on admission) Patient presented with polyuria, polydipsia, generalized fatigue, blood glucose 1408, BHBA 6.68 consistent with DKA. Patient started on DKA protocol, IV hydration, insulin gtt. Anion gap has now closed, BHBA normalized. Started on carb modified diet, transitioned on subcu insulin. Continue to monitor serum electrolytes. Consult diabetic coordinator. Her blood sugar has been fluctuating, increase Semglee to 30 units. Changed to moderate sliding scale  AKI (acute kidney injury) ([place])- (present on admission) Baseline serum creatinine normal, presented with serum creatinine 3.72 Likely in the setting of DKA.  Avoid nephrotoxic medications. Continue IV hydration.  Creatinine improving 3.21> 3.18>2.70 Renal ultrasound without obstruction.  Chronic combined systolic and diastolic CHF, NYHA class 2 ([place])- (present on admission) She appeared dehydrated, does not appear volume overloaded. Continue IV hydration, remain conscious of history of CHF. Last LVEF 45 to 50% in 2019. Repeat echo  shows LVEF 55- 60%.  Debility- (present on admission) Needs PT and OT evaluation.  Essential hypertension- (present on admission) Continue Coreg.  Sleep apnea- (present on admission) Patient denies using CPAP in the past.  Morbid obesity with BMI of 50.0-59.9, adult ([place]) Diet and exercise discussed in detail.   Pressure Injury 01/12/22 Groin Right;Left;Proximal Stage 2 -  Partial thickness loss of dermis presenting as a shallow open injury with a red, pink wound bed without slough. x2 on inner L thigh, x2 on inner R thigh (Active)  01/12/22 0000  Location: Groin  Location Orientation: Right;Left;Proximal  Staging: Stage 2 -  Partial thickness loss of dermis presenting as a shallow open injury with a red, pink wound bed without slough.  Wound Description (Comments): x2 on inner L thigh, x2 on inner R thigh  Present on Admission: Yes    Subjective: Patient was seen and examined at bedside.  Overnight events noted. Patient reports feeling better, reports she is feeling dizzy, takes meclizine at home.  She denies any nausea and vomiting.  Physical Exam: Vitals:   01/13/22 0400 01/13/22 0423 01/13/22 0740 01/13/22 0800  BP: 104/61  (!) 115/101   Pulse: 88  93   Resp: (!) 21  19   Temp:  98.7 F (37.1 C)  98.2 F (36.8 C)  TempSrc:  Axillary  Oral  SpO2: 95%  98%   Weight:       Physical Exam Vitals and nursing note reviewed.  Constitutional:      Appearance: Normal appearance. She is obese.  HENT:     Head: Normocephalic and atraumatic.  Eyes:     Conjunctiva/sclera: Conjunctivae normal.  Cardiovascular:     Rate and Rhythm: Normal rate and regular rhythm.  Heart sounds: Normal heart sounds.  Pulmonary:     Effort: Pulmonary effort is normal.     Breath sounds: Normal breath sounds.  Abdominal:     General: Abdomen is flat. Bowel sounds are normal.     Palpations: Abdomen is soft.  Skin:    General: Skin is warm and dry.  Neurological:     General: No focal  deficit present.     Mental Status: She is alert and oriented to person, place, and time. Mental status is at baseline.  Psychiatric:        Mood and Affect: Mood normal.        Behavior: Behavior normal.      Data Reviewed: I have Reviewed nursing notes, Vitals, and Lab results since pt's last encounter. Pertinent lab results CBC BMP I have ordered test including CBC BMP I have discussed pt's care plan and test results with patient.   Family Communication: Spoke with son at bedside.  Disposition: Status is: Inpatient Remains inpatient appropriate because:   Admitted for diabetic ketoacidosis and AKI requiring IV hydration and insulin gtt. as per DKA protocol.    Planned Discharge Destination: Home   Time spent: 35 minutes  Author: [name], MD 01/13/2022 10:54 AM  For on call review [email or web address].

## 2022-01-13 NOTE — Progress Notes (Signed)
Inpatient Diabetes Program Recommendations  AACE/ADA: New Consensus Statement on Inpatient Glycemic Control (2015)  Target Ranges:  Prepandial:   less than 140 mg/dL      Peak postprandial:   less than 180 mg/dL (1-2 hours)      Critically ill patients:  140 - 180 mg/dL   Lab Results  Component Value Date   GLUCAP 485 (H) 01/13/2022   HGBA1C 7.7 (H) 06/14/2021    Review of Glycemic Control  Latest Reference Range & Units 01/12/22 18:16 01/12/22 21:13 01/13/22 07:23  Glucose-Capillary 70 - 99 mg/dL 415 (H) 433 (H) 485 (H)  (H): Data is abnormally high Diabetes history: DM 2-NEW DIAGNOSIS Outpatient Diabetes medications: None Current orders for Inpatient glycemic control:  Semglee 30 units QD, Novolog 0-15 units TID   Inpatient Diabetes Program Recommendations:    May want to also consider adding Novolog 4 units TID (assuming patient is consuming >50 % of meals) and HS coverage.   Thanks,  Bronson Curb, MSN, RNC-OB Diabetes Coordinator [phone] (8a-5p)

## 2022-01-13 NOTE — Progress Notes (Signed)
Attempted to call ICU to receive report on the patient. ICU charge nurse said she would call back when report was able to be given by patient's nurse.

## 2022-01-14 DIAGNOSIS — E1110 Type 2 diabetes mellitus with ketoacidosis without coma: Secondary | ICD-10-CM | POA: Diagnosis not present

## 2022-01-14 LAB — GLUCOSE, CAPILLARY
Glucose-Capillary: 101 mg/dL — ABNORMAL HIGH (ref 70–99)
Glucose-Capillary: 167 mg/dL — ABNORMAL HIGH (ref 70–99)
Glucose-Capillary: 318 mg/dL — ABNORMAL HIGH (ref 70–99)
Glucose-Capillary: 406 mg/dL — ABNORMAL HIGH (ref 70–99)
Glucose-Capillary: 429 mg/dL — ABNORMAL HIGH (ref 70–99)

## 2022-01-14 LAB — CBC
HCT: 39.7 % (ref 36.0–46.0)
Hemoglobin: 12.7 g/dL (ref 12.0–15.0)
MCH: 27.5 pg (ref 26.0–34.0)
MCHC: 32 g/dL (ref 30.0–36.0)
MCV: 85.9 fL (ref 80.0–100.0)
Platelets: 150 10*3/uL (ref 150–400)
RBC: 4.62 MIL/uL (ref 3.87–5.11)
RDW: 14.4 % (ref 11.5–15.5)
WBC: 9.1 10*3/uL (ref 4.0–10.5)
nRBC: 0 % (ref 0.0–0.2)

## 2022-01-14 LAB — BASIC METABOLIC PANEL
Anion gap: 15 (ref 5–15)
BUN: 55 mg/dL — ABNORMAL HIGH (ref 6–20)
CO2: 24 mmol/L (ref 22–32)
Calcium: 8.4 mg/dL — ABNORMAL LOW (ref 8.9–10.3)
Chloride: 102 mmol/L (ref 98–111)
Creatinine, Ser: 1.57 mg/dL — ABNORMAL HIGH (ref 0.44–1.00)
GFR, Estimated: 39 mL/min — ABNORMAL LOW (ref >60)
Glucose, Bld: 435 mg/dL — ABNORMAL HIGH (ref 70–99)
Potassium: 3.1 mmol/L — ABNORMAL LOW (ref 3.5–5.1)
Sodium: 141 mmol/L (ref 135–145)

## 2022-01-14 LAB — PHOSPHORUS: Phosphorus: 2.8 mg/dL (ref 2.5–4.6)

## 2022-01-14 LAB — MAGNESIUM: Magnesium: 2 mg/dL (ref 1.7–2.4)

## 2022-01-14 MED ORDER — INSULIN ASPART 100 UNIT/ML IJ SOLN
8.0000 [IU] | Freq: Three times a day (TID) | INTRAMUSCULAR | Status: DC
Start: 2022-01-14 — End: 2022-01-16
  Administered 2022-01-14 – 2022-01-16 (×7): 8 [IU] via SUBCUTANEOUS

## 2022-01-14 MED ORDER — INSULIN ASPART 100 UNIT/ML IJ SOLN
2.0000 [IU] | Freq: Once | INTRAMUSCULAR | Status: AC
  Administered 2022-01-14: 2 [IU] via SUBCUTANEOUS

## 2022-01-14 MED ORDER — ALUM & MAG HYDROXIDE-SIMETH 200-200-20 MG/5ML PO SUSP
15.0000 mL | Freq: Four times a day (QID) | ORAL | Status: DC | PRN
  Administered 2022-01-14 – 2022-01-16 (×2): 15 mL via ORAL
  Filled 2022-01-14 (×2): qty 30

## 2022-01-14 MED ORDER — INSULIN GLARGINE-YFGN 100 UNIT/ML ~~LOC~~ SOLN
35.0000 [IU] | Freq: Two times a day (BID) | SUBCUTANEOUS | Status: DC
  Administered 2022-01-14 – 2022-01-16 (×4): 35 [IU] via SUBCUTANEOUS
  Filled 2022-01-14 (×5): qty 0.35

## 2022-01-14 MED ORDER — POTASSIUM CHLORIDE 20 MEQ PO PACK
40.0000 meq | PACK | Freq: Once | ORAL | Status: AC
  Administered 2022-01-14: 40 meq via ORAL
  Filled 2022-01-14: qty 2

## 2022-01-14 NOTE — Progress Notes (Signed)
Progress Note   Patient: Deborah [name] YWV:[id number] DOB: [birth year] DOA: 01/11/2022     2  DOS: the patient was seen and examined on 01/14/2022   Brief hospital course: This 56 years old female with PMH significant for CHF, hypertension, hyperlipidemia, morbid obesity presented in the ED with complaints of generalized fatigue, polyuria, polydipsia muscle cramps.  She denies any prior history of diabetes.  Labs in the ED consistent with diabetic ketoacidosis.  Blood glucose 1403, BUN 69,  creatinine 3.21, sodium 131, beta hydroxybutyric acid 6.68.  Patient was admitted for diabetic ketoacidosis, HHS.  She was started on insulin gtt. as per DKA protocol, gentle IV hydration,  given history of CHF.  Anion gap is now closed, BHBA has normalized.  She is transitioned to [facility] 25 units and sliding scale.  Diabetic coordinator consulted. She does have significant insulin resistance.  Assessment and Plan: * DKA, type 2, not at goal [facility])- (present on admission) Patient presented with polyuria, polydipsia, generalized fatigue, blood glucose 1408, BHBA 6.68 consistent with DKA. Patient started on DKA protocol, IV hydration, insulin gtt. Anion gap has now closed, BHBA normalized. Started on carb modified diet, transitioned on subcu insulin. Continue to monitor serum electrolytes. Consulted diabetic coordinator. Her blood sugar has been fluctuating , Initaited on Semglee 30 units in the morning and 15 at bedtime. Sliding scale changed to resistant scale.  Continues to have persistently elevated blood sugars. Increase Semglee to 35 units twice daily, continue resistant sliding scale.    AKI (acute kidney injury) ([place])- (present on admission) Baseline serum creatinine normal, presented with serum creatinine 3.72 Likely in the setting of DKA.  Avoid nephrotoxic medications. Continue IV hydration.  Creatinine improving 3.21> 3.18>2.70>1.57 Renal ultrasound without obstruction.  Chronic combined  systolic and diastolic CHF, NYHA class 2 ([place])- (present on admission) She appeared dehydrated, does not appear volume overloaded. Continue IV hydration, remain conscious of history of CHF. Last LVEF 45 to 50% in 2019. Repeat echo shows LVEF 55- 60%.  Debility- (present on admission) Needs PT and OT evaluation.  Essential hypertension- (present on admission) Hold Coreg as BP is soft.  Sleep apnea- (present on admission) Patient denies using CPAP in the past.  Morbid obesity with BMI of 50.0-59.9, adult ([place]) Diet and exercise discussed in detail.   Pressure Injury 01/12/22 Groin Right;Left;Proximal Stage 2 -  Partial thickness loss of dermis presenting as a shallow open injury with a red, pink wound bed without slough. x2 on inner L thigh, x2 on inner R thigh (Active)  01/12/22 0000  Location: Groin  Location Orientation: Right;Left;Proximal  Staging: Stage 2 -  Partial thickness loss of dermis presenting as a shallow open injury with a red, pink wound bed without slough.  Wound Description (Comments): x2 on inner L thigh, x2 on inner R thigh  Present on Admission: Yes    Subjective:  Patient was seen and examined at bedside.  Overnight events noted. Patient reports feeling better, dizziness has improved with meclizine. She denies nausea and vomiting. Her blood sugars are still elevated despite being on insulin.  Physical Exam: Vitals:   01/14/22 0500 01/14/22 0600 01/14/22 0800 01/14/22 0808  BP:  (!) 110/57  (!) 104/57  Pulse: 84 83  89  Resp: 20 19  17   Temp:   98.1 F (36.7 C)   TempSrc:   Oral   SpO2: 94% 96%  100%  Weight:       Physical Exam Vitals and nursing note reviewed.  Constitutional:  Appearance: Normal appearance. She is obese.  HENT:     Head: Normocephalic and atraumatic.  Eyes:     Conjunctiva/sclera: Conjunctivae normal.  Cardiovascular:     Rate and Rhythm: Normal rate and regular rhythm.     Heart sounds: Normal heart sounds.   Pulmonary:     Effort: Pulmonary effort is normal.     Breath sounds: Normal breath sounds.  Abdominal:     General: Bowel sounds are normal. There is no distension.     Palpations: Abdomen is soft.     Tenderness: There is no abdominal tenderness.  Skin:    General: Skin is warm and dry.  Neurological:     General: No focal deficit present.     Mental Status: She is alert and oriented to person, place, and time. Mental status is at baseline.  Psychiatric:        Mood and Affect: Mood normal.        Behavior: Behavior normal.       Data Reviewed: I have Reviewed nursing notes, Vitals, and Lab results since pt's last encounter. Pertinent lab results CBC, CMP I have ordered test including CBC, CMP I have reviewed the last note from diabetic coordinator,  I have discussed pt's care plan and test results with patient.   Family Communication: Spoke with son at bedside.  Disposition: Status is: Inpatient Remains inpatient appropriate because:   Admitted for diabetic ketoacidosis and AKI requiring IV hydration and insulin gtt. as per DKA protocol. DKA resolved, AKI improving , now having uncontrolled blood sugar despite being on insulin.    Planned Discharge Destination: Home   Time spent: 35 minutes  Author: [name], MD 01/14/2022 11:09 AM  For on call review [email or web address].

## 2022-01-14 NOTE — Progress Notes (Signed)
Inpatient Diabetes Program Recommendations  AACE/ADA: New Consensus Statement on Inpatient Glycemic Control (2015)  Target Ranges:  Prepandial:   less than 140 mg/dL      Peak postprandial:   less than 180 mg/dL (1-2 hours)      Critically ill patients:  140 - 180 mg/dL   Lab Results  Component Value Date   GLUCAP 429 (H) 01/14/2022   HGBA1C 7.7 (H) 06/14/2021    Review of Glycemic Control  Latest Reference Range & Units 01/13/22 13:11 01/13/22 16:30 01/13/22 21:43 01/14/22 08:08  Glucose-Capillary 70 - 99 mg/dL 517 (HH) 437 (H) 377 (H) 429 (H)  (HH): Data is critically high (H): Data is abnormally high Diabetes history: DM 2-NEW DIAGNOSIS Outpatient Diabetes medications: None Current orders for Inpatient glycemic control:  Semglee 30 units QD, Semglee 15 units QHS, Novolog 0-20 units TID    Inpatient Diabetes Program Recommendations:     Patient's glucose continues to exceed inpatient goals and is >400-500's mg/dL. With renal status and difficulty titrating insulin, would recommend IV insulin at this time.  Secure chat sent to MD. Secure chat also sent to RN to verifiy oral intake; confirmed at 50%.  Thanks, Bronson Curb, MSN, RNC-OB Diabetes Coordinator [phone] (8a-5p)

## 2022-01-15 DIAGNOSIS — E1110 Type 2 diabetes mellitus with ketoacidosis without coma: Secondary | ICD-10-CM | POA: Diagnosis not present

## 2022-01-15 DIAGNOSIS — E876 Hypokalemia: Secondary | ICD-10-CM

## 2022-01-15 LAB — BASIC METABOLIC PANEL
Anion gap: 11 (ref 5–15)
BUN: 36 mg/dL — ABNORMAL HIGH (ref 6–20)
CO2: 27 mmol/L (ref 22–32)
Calcium: 8.6 mg/dL — ABNORMAL LOW (ref 8.9–10.3)
Chloride: 103 mmol/L (ref 98–111)
Creatinine, Ser: 1.21 mg/dL — ABNORMAL HIGH (ref 0.44–1.00)
GFR, Estimated: 54 mL/min — ABNORMAL LOW (ref >60)
Glucose, Bld: 175 mg/dL — ABNORMAL HIGH (ref 70–99)
Potassium: 2.7 mmol/L — CL (ref 3.5–5.1)
Sodium: 141 mmol/L (ref 135–145)

## 2022-01-15 LAB — GLUCOSE, CAPILLARY
Glucose-Capillary: 189 mg/dL — ABNORMAL HIGH (ref 70–99)
Glucose-Capillary: 196 mg/dL — ABNORMAL HIGH (ref 70–99)
Glucose-Capillary: 169 mg/dL — ABNORMAL HIGH (ref 70–99)
Glucose-Capillary: 107 mg/dL — ABNORMAL HIGH (ref 70–99)

## 2022-01-15 LAB — HEMOGLOBIN A1C
Hgb A1c MFr Bld: >15.5 % — ABNORMAL HIGH (ref 4.8–5.6)
Mean Plasma Glucose: >398 mg/dL

## 2022-01-15 MED ORDER — POTASSIUM CHLORIDE 20 MEQ PO PACK
40.0000 meq | PACK | Freq: Once | ORAL | Status: AC
  Administered 2022-01-15: 40 meq via ORAL
  Filled 2022-01-15: qty 2

## 2022-01-15 MED ORDER — METHOCARBAMOL 500 MG PO TABS
500.0000 mg | ORAL_TABLET | Freq: Three times a day (TID) | ORAL | Status: DC
  Administered 2022-01-15 – 2022-01-16 (×5): 500 mg via ORAL
  Filled 2022-01-15 (×5): qty 1

## 2022-01-15 MED ORDER — POTASSIUM CHLORIDE 20 MEQ PO PACK
40.0000 meq | PACK | Freq: Two times a day (BID) | ORAL | Status: AC
  Administered 2022-01-15 (×2): 40 meq via ORAL
  Filled 2022-01-15 (×2): qty 2

## 2022-01-15 NOTE — Progress Notes (Signed)
Inpatient Diabetes Program Recommendations  AACE/ADA: New Consensus Statement on Inpatient Glycemic Control (2015)  Target Ranges:  Prepandial:   less than 140 mg/dL      Peak postprandial:   less than 180 mg/dL (1-2 hours)      Critically ill patients:  140 - 180 mg/dL   Lab Results  Component Value Date   GLUCAP 169 (H) 01/15/2022   HGBA1C >15.5 (H) 01/12/2022    Review of Glycemic Control  Diabetes history: DM2 - new diagnosis Outpatient Diabetes medications: None Current orders for Inpatient glycemic control: Semglee 35 units BID, Novolog 0-20 TID + 8 units TID  HgbA1C - > 15.5% CBGs 189, 196, 169 mg/dL Good glycemic control today.  Inpatient Diabetes Program Recommendations:    Spoke with pt at bedside regarding diabetes diagnosis and discharging home on insulin. Pt states her son said he will assist pt at home, until she gets used to giving herself insulin. Discussed diet, portion control, good substitutes for high sugar beverages, importance of exercise, and f/u with PCP. Pt voices understanding. RN to allow pt to give her own insulin while inpatient. Will need a lot of support. Discussed importance of monitoring blood sugars and taking blood sugar log to PCP visit. Answered all questions.   Thank you. Lorenda Peck, RD, LDN, CDE Inpatient Diabetes Coordinator [phone]

## 2022-01-15 NOTE — Assessment & Plan Note (Signed)
Replaced.  Continue to monitor °

## 2022-01-15 NOTE — Progress Notes (Signed)
Progress Note   Patient: Deborah [name] JYN:[id number] DOB: 11/28/1965 DOA: 01/11/2022     3  DOS: the patient was seen and examined on 01/15/2022   Brief hospital course: This 56 years old female with PMH significant for CHF, hypertension, hyperlipidemia, morbid obesity presented in the ED with complaints of generalized fatigue, polyuria, polydipsia muscle cramps.  She denies any prior history of diabetes.  Labs in the ED consistent with diabetic ketoacidosis.  Blood glucose 1403, BUN 69,  creatinine 3.21, sodium 131, beta hydroxybutyric acid 6.68.  Patient was admitted for diabetic ketoacidosis, HHS.  She was started on insulin gtt. as per DKA protocol, gentle IV hydration,  given history of CHF.  Anion gap is now closed, BHBA has normalized.  She is transitioned to [facility] 25 units and sliding scale.  Diabetic coordinator consulted. She does have significant insulin resistance.  Assessment and Plan: * DKA, type 2, not at goal [facility])- (present on admission) Patient presented with polyuria, polydipsia, generalized fatigue, blood glucose 1408, BHBA 6.68 consistent with DKA. Patient started on DKA protocol, IV hydration, insulin gtt. Anion gap has now closed, BHBA normalized. Started on carb modified diet, transitioned on subcu insulin.  Consulted diabetic coordinator. Her blood sugar has been fluctuating , Initaited on Semglee 30 units in the morning and 15 at bedtime. Sliding scale changed to resistant scale.  Continues to have persistently elevated blood sugars. Increase Semglee to 35 units twice daily, Novolog 8 units with meals , Continue resistant sliding scale. Continue to monitor fingersticks and serum electrolytes. FS 175   AKI (acute kidney injury) ([place])- (present on admission) Baseline serum creatinine normal, presented with serum creatinine 3.72 Likely in the setting of DKA.  Avoid nephrotoxic medications. Continue IV hydration.  Creatinine improving 3.21>  3.18>2.70>1.57>1.21 Renal ultrasound without obstruction.  Chronic combined systolic and diastolic CHF, NYHA class 2 ([place])- (present on admission) She appeared dehydrated, does not appear volume overloaded. Continue IV hydration, remain conscious of history of CHF. Last LVEF 45 to 50% in 2019. Repeat echo shows LVEF 55- 60%.  Hypokalemia Replaced.  Continue to monitor  Debility- (present on admission) Needs PT and OT evaluation.  Essential hypertension- (present on admission) Hold Coreg as BP is soft.  Sleep apnea- (present on admission) Patient denies using CPAP in the past.  Morbid obesity with BMI of 50.0-59.9, adult ([place]) Diet and exercise discussed in detail.   Pressure Injury 01/12/22 Groin Right;Left;Proximal Stage 2 -  Partial thickness loss of dermis presenting as a shallow open injury with a red, pink wound bed without slough. x2 on inner L thigh, x2 on inner R thigh (Active)  01/12/22 0000  Location: Groin  Location Orientation: Right;Left;Proximal  Staging: Stage 2 -  Partial thickness loss of dermis presenting as a shallow open injury with a red, pink wound bed without slough.  Wound Description (Comments): x2 on inner L thigh, x2 on inner R thigh  Present on Admission: Yes    Subjective:  Patient was seen and examined at bedside.  Overnight events noted. Patient reports feeling better, denies any chest pain, shortness of breath. Her blood sugars has finally started to show some improvement.  Physical Exam: Vitals:   01/15/22 0400 01/15/22 0800 01/15/22 0827 01/15/22 1020  BP: (!) 93/40 (!) 100/45    Pulse: (!) 50 89    Resp: 20 20    Temp: 98.6 F (37 C)  98.5 F (36.9 C)   TempSrc: Oral  Oral   SpO2: 98% 96%  Weight:    123.3 kg  Height:    5\' 5"  (1.651 m)   General exam: Appears comfortable, deconditioned, not in any acute distress. Respiratory : Clear to auscultation bilaterally, no wheezing, no crackles. Cardiovascular : S1-S2 heard, regular  rate and rhythm, no murmur. Gastrointestinal : Abdomen is soft, nontender, nondistended, BS+ Central nervous system: Alert and oriented x3, no neurological deficits Extremities: No edema, no cyanosis, no clubbing Psychiatry: Mood and insight normal   Data Reviewed: I have Reviewed nursing notes, Vitals, and Lab results since pt's last encounter. Pertinent lab results CBC, BMP I have ordered test including BC, BMP I have reviewed the last note from diabetic coordinator,  I have discussed pt's care plan and test results with patient.   Family Communication: Spoke with son at bedside.  Disposition: Status is: Inpatient Remains inpatient appropriate because:   Admitted for diabetic ketoacidosis and AKI requiring IV hydration and insulin gtt. as per DKA protocol. DKA resolved, AKI improving , now having uncontrolled blood sugar despite being on insulin.    Planned Discharge Destination: Home   Time spent: 35 minutes  Author: [name], MD 01/15/2022 10:46 AM  For on call review [email or web address].

## 2022-01-15 NOTE — Progress Notes (Signed)
Physical Therapy Treatment Patient Details Name: Deborah [name] MRN: [id number] DOB: [birth year] Today's Date: 01/15/2022   History of Present Illness Patient is a 56 year old female admitted to [facility] on 2/16 with complaints of generalized fatigue, polyuria, polydipsia muscle cramps. Labs in the Ed consistent with DKA.  Now diagnosed with DM, type 2. PMH: CHF, HTN HLD, Obesity, right hand gongenital deformity, endometrial cancer    PT Comments    Focus of session today functional mobility, transfers, and ambulation tolerance. The patient tolerated well and was limited secondary to DOE and fatigue.  Pt. Shows overall improvement with transfer ability and ambulation tolerance. Overall strength, endurance, and functional balance are still limiting function. Pt. Would benefit from skilled PT to continue to address her ability to transfer, stand, ambulate, and her functional endurance. Plan and discharge setting remains unchanged. Pt to follow acutely as appropriate.     Recommendations for follow up therapy are one component of a multi-disciplinary discharge planning process, led by the attending physician.  Recommendations may be updated based on patient status, additional functional criteria and insurance authorization.  Follow Up Recommendations  Home health PT     Assistance Recommended at Discharge Intermittent Supervision/Assistance  Patient can return home with the following A little help with walking and/or transfers;Help with stairs or ramp for entrance;Assist for transportation;A little help with bathing/dressing/bathroom   Equipment Recommendations  Rolling walker (2 wheels)    Recommendations for Other Services       Precautions / Restrictions Precautions Precautions: None Restrictions Weight Bearing Restrictions: No     Mobility  Bed Mobility Overal bed mobility: Needs Assistance Bed Mobility: Supine to Sit, Sit to Supine     Supine to sit: Min assist Sit to supine:  Min assist   General bed mobility comments: Increased time, management of Legs, trunk support.    Transfers Overall transfer level: Needs assistance Equipment used: Rolling walker (2 wheels) Transfers: Sit to/from Stand Sit to Stand: Min assist           General transfer comment: Min A for power up, self steady, complains of dizziness    Ambulation/Gait Ambulation/Gait assistance: Min guard Gait Distance (Feet): 60 Feet Assistive device: Rolling walker (2 wheels) Gait Pattern/deviations: Step-through pattern, Decreased step length - right, Decreased step length - left, Trunk flexed Gait velocity: decr     General Gait Details: Min guard to ambulate, pt had to focus during gait and was unable to participate in conversation. She had slight DOE throughout and required cuing for to not run into things.   Stairs             Wheelchair Mobility    Modified Rankin (Stroke Patients Only)       Balance Overall balance assessment: Needs assistance Sitting-balance support: Feet supported, Single extremity supported Sitting balance-Leahy Scale: Fair     Standing balance support: Bilateral upper extremity supported Standing balance-Leahy Scale: Poor Standing balance comment: Pt. able to self steady in stand but requires walker or hand support for any dynamic movements.                            Cognition Arousal/Alertness: Awake/alert Behavior During Therapy: WFL for tasks assessed/performed Overall Cognitive Status: Within Functional Limits for tasks assessed  Exercises      General Comments        Pertinent Vitals/Pain Pain Assessment Pain Assessment: Faces Faces Pain Scale: Hurts a little bit Pain Location: Back and R hip Pain Descriptors / Indicators: Aching, Guarding, Sore Pain Intervention(s): Limited activity within patient's tolerance, Monitored during session    Home Living                           Prior Function            PT Goals (current goals can now be found in the care plan section) Acute Rehab PT Goals Patient Stated Goal: to go to her home PT Goal Formulation: With patient Time For Goal Achievement: 01/26/22 Potential to Achieve Goals: Good Progress towards PT goals: Progressing toward goals    Frequency    Min 3X/week      PT Plan Current plan remains appropriate    Co-evaluation              AM-PAC PT "6 Clicks" Mobility   Outcome Measure  Help needed turning from your back to your side while in a flat bed without using bedrails?: A Little Help needed moving from lying on your back to sitting on the side of a flat bed without using bedrails?: A Little Help needed moving to and from a bed to a chair (including a wheelchair)?: A Little Help needed standing up from a chair using your arms (e.g., wheelchair or bedside chair)?: A Little Help needed to walk in hospital room?: A Little Help needed climbing 3-5 steps with a railing? : A Lot 6 Click Score: 17    End of Session   Activity Tolerance: Patient tolerated treatment well Patient left: in bed;with call bell/phone within reach Nurse Communication: Mobility status PT Visit Diagnosis: Unsteadiness on feet (R26.81);Difficulty in walking, not elsewhere classified (R26.2)     Time: 8119-1478 PT Time Calculation (min) (ACUTE ONLY): 21 min  Charges:  $Therapeutic Activity: 8-22 mins                     Thermon Leyland, SPT Acute Rehab Services   Thermon Leyland 01/15/2022, 1:34 PM

## 2022-01-16 DIAGNOSIS — E1110 Type 2 diabetes mellitus with ketoacidosis without coma: Secondary | ICD-10-CM | POA: Diagnosis not present

## 2022-01-16 LAB — BASIC METABOLIC PANEL
Anion gap: 11 (ref 5–15)
BUN: 22 mg/dL — ABNORMAL HIGH (ref 6–20)
CO2: 23 mmol/L (ref 22–32)
Calcium: 7.8 mg/dL — ABNORMAL LOW (ref 8.9–10.3)
Chloride: 102 mmol/L (ref 98–111)
Creatinine, Ser: 1.2 mg/dL — ABNORMAL HIGH (ref 0.44–1.00)
GFR, Estimated: 54 mL/min — ABNORMAL LOW (ref >60)
Glucose, Bld: 120 mg/dL — ABNORMAL HIGH (ref 70–99)
Potassium: 2.8 mmol/L — ABNORMAL LOW (ref 3.5–5.1)
Sodium: 136 mmol/L (ref 135–145)

## 2022-01-16 LAB — CBC
HCT: 39.1 % (ref 36.0–46.0)
Hemoglobin: 12.5 g/dL (ref 12.0–15.0)
MCH: 28.1 pg (ref 26.0–34.0)
MCHC: 32 g/dL (ref 30.0–36.0)
MCV: 87.9 fL (ref 80.0–100.0)
Platelets: 147 10*3/uL — ABNORMAL LOW (ref 150–400)
RBC: 4.45 MIL/uL (ref 3.87–5.11)
RDW: 14.2 % (ref 11.5–15.5)
WBC: 6.5 10*3/uL (ref 4.0–10.5)
nRBC: 0 % (ref 0.0–0.2)

## 2022-01-16 LAB — GLUCOSE, CAPILLARY
Glucose-Capillary: 121 mg/dL — ABNORMAL HIGH (ref 70–99)
Glucose-Capillary: 199 mg/dL — ABNORMAL HIGH (ref 70–99)
Glucose-Capillary: 245 mg/dL — ABNORMAL HIGH (ref 70–99)

## 2022-01-16 LAB — PHOSPHORUS: Phosphorus: 1.7 mg/dL — ABNORMAL LOW (ref 2.5–4.6)

## 2022-01-16 LAB — MAGNESIUM: Magnesium: 1.4 mg/dL — ABNORMAL LOW (ref 1.7–2.4)

## 2022-01-16 IMAGING — MG DIGITAL SCREENING BILAT W/ TOMO W/ CAD
8 of 15 series · 8 of 40 positions shown · non-contrast
Comparison: Previous exam(s).

CLINICAL DATA: Screening.

EXAM:
DIGITAL SCREENING BILATERAL MAMMOGRAM WITH TOMO AND CAD

[L MLO synth-2D (1 of 2)]
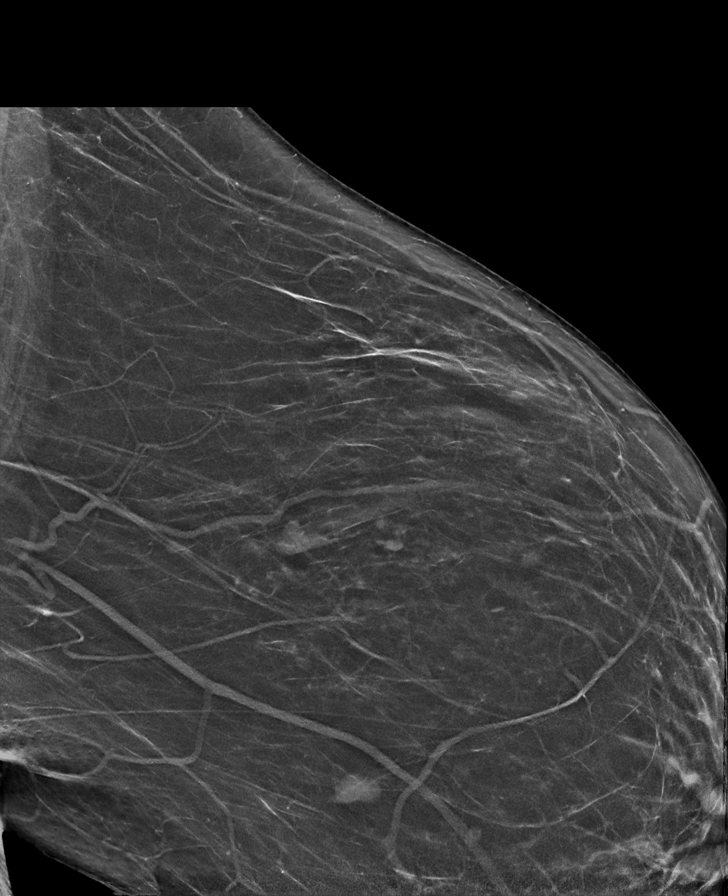

[R MLO synth-2D (1 of 2)]
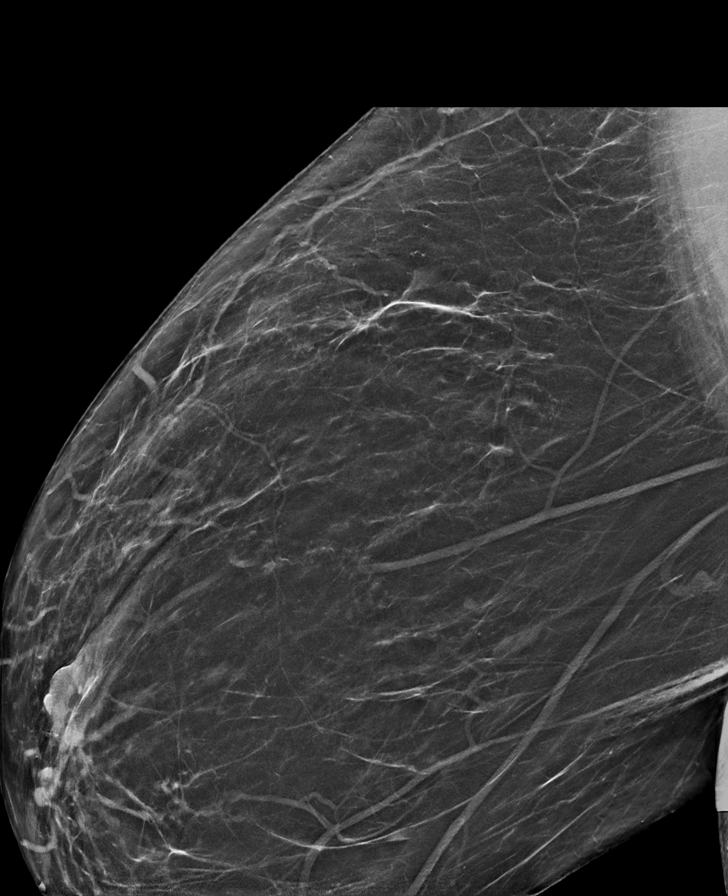

[R CC synth-2D]
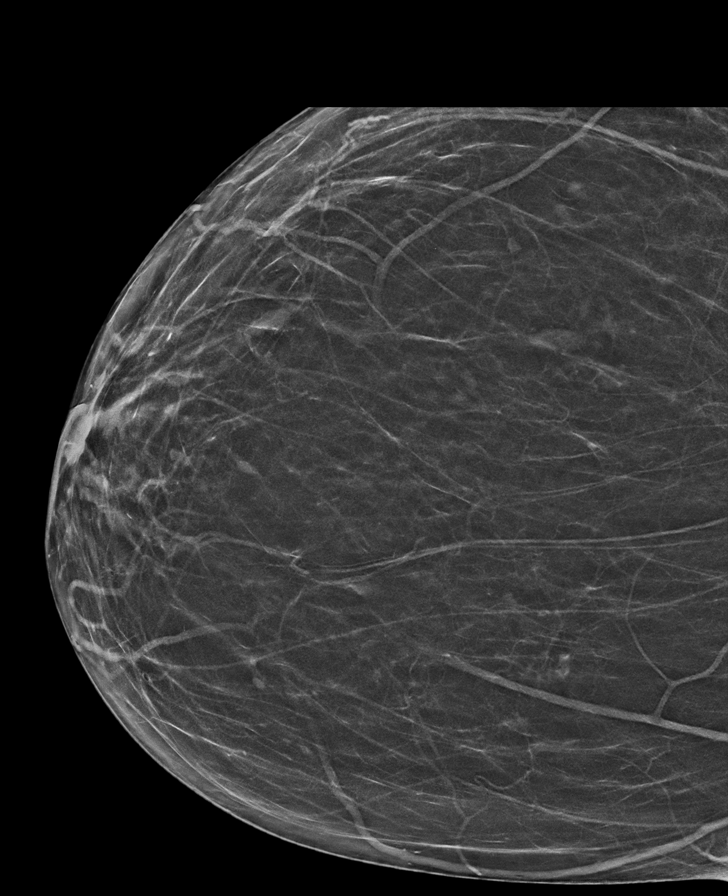

[R XCCL synth-2D]
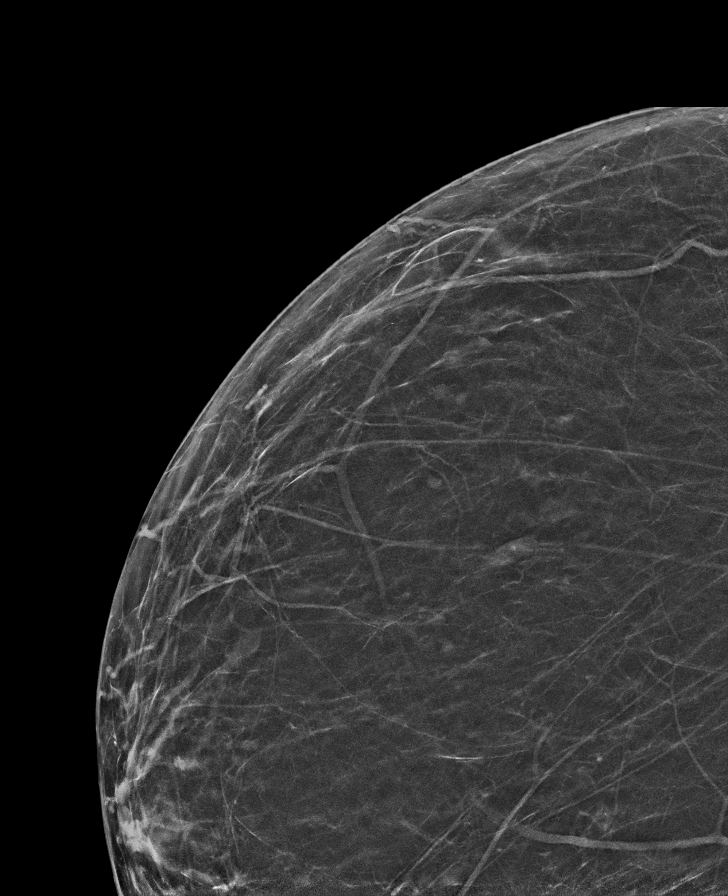

[R MLO synth-2D (2 of 2)]
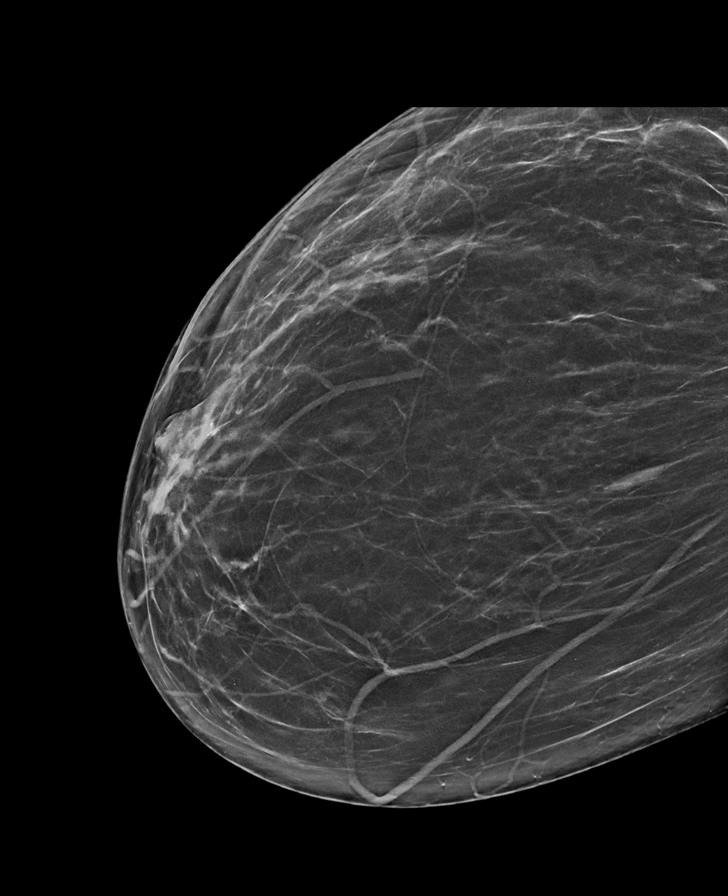

[L CC synth-2D]
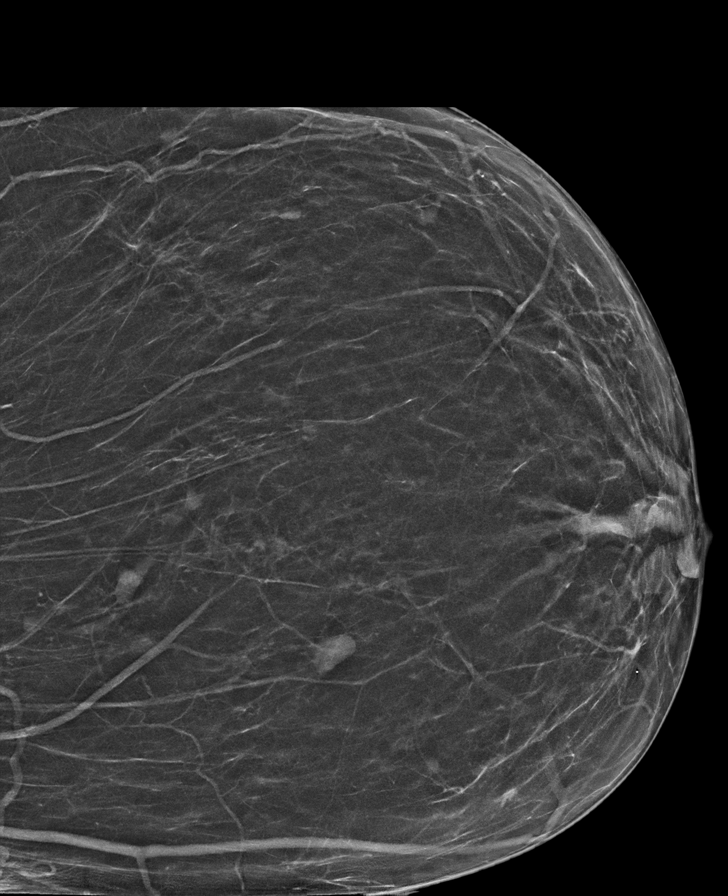

[L MLO synth-2D (2 of 2)]
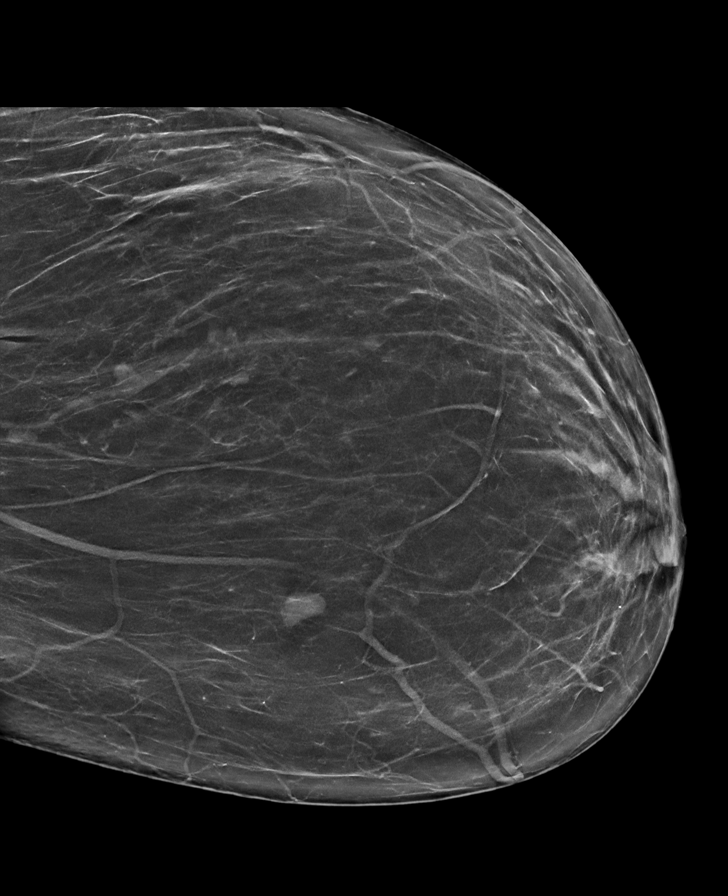

[L CC tomo · tomo slice 44/64.0]
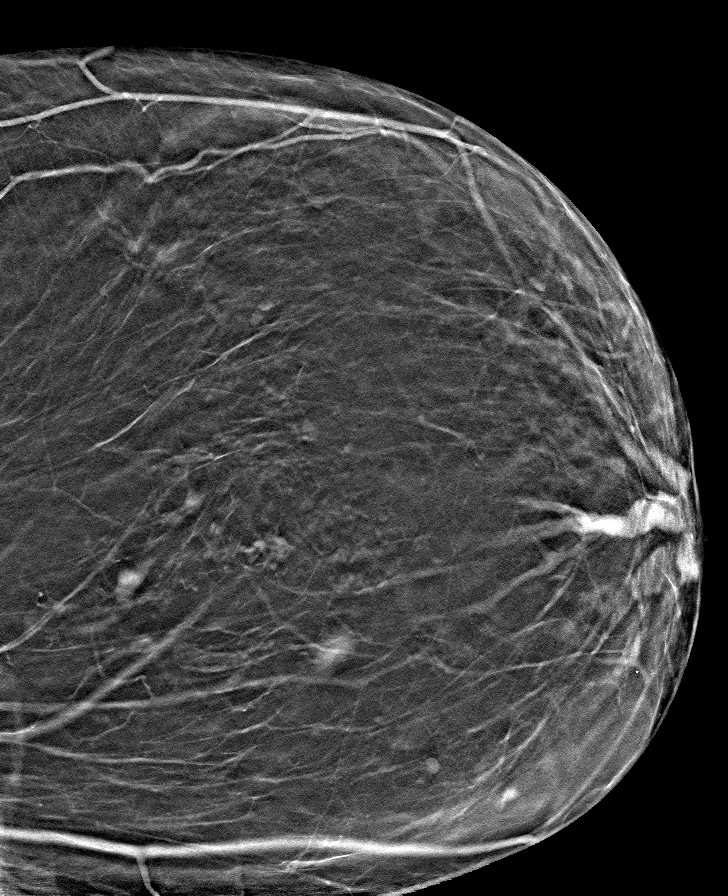

[8 of 40 positions shown; findings below may reference images not displayed]

ACR Breast Density Category b: There are scattered areas of
fibroglandular density.
FINDINGS: There are no findings suspicious for malignancy. Images were
processed with CAD.
IMPRESSION: No mammographic evidence of malignancy. A result letter of this
screening mammogram will be mailed directly to the patient.

RECOMMENDATION:
Screening mammogram in one year. (Code:[id number])

BI-RADS CATEGORY  1: Negative.

## 2022-01-16 MED ORDER — MAGNESIUM SULFATE 4 GM/100ML IV SOLN
4.0000 g | Freq: Once | INTRAVENOUS | Status: AC
  Administered 2022-01-16: 4 g via INTRAVENOUS
  Filled 2022-01-16: qty 100

## 2022-01-16 MED ORDER — POTASSIUM CHLORIDE 20 MEQ PO PACK: 40.0000 meq | PACK | Freq: Once | ORAL | Status: DC

## 2022-01-16 MED ORDER — POTASSIUM PHOSPHATES 15 MMOLE/5ML IV SOLN
30.0000 mmol | Freq: Once | INTRAVENOUS | Status: AC
  Administered 2022-01-16: 30 mmol via INTRAVENOUS
  Filled 2022-01-16: qty 10

## 2022-01-16 MED ORDER — INSULIN PEN NEEDLE 32G X 4 MM MISC
3 refills | Status: DC
Start: 2022-01-16 — End: 2022-12-11

## 2022-01-16 MED ORDER — METHOCARBAMOL 500 MG PO TABS: 500.0000 mg | ORAL_TABLET | Freq: Three times a day (TID) | ORAL | 0 refills | Status: DC

## 2022-01-16 MED ORDER — POTASSIUM CHLORIDE 20 MEQ PO PACK
40.0000 meq | PACK | Freq: Two times a day (BID) | ORAL | Status: AC
  Administered 2022-01-16 (×2): 40 meq via ORAL
  Filled 2022-01-16 (×2): qty 2

## 2022-01-16 MED ORDER — INSULIN ASPART 100 UNIT/ML FLEXPEN: 10.0000 [IU] | PEN_INJECTOR | Freq: Three times a day (TID) | SUBCUTANEOUS | 11 refills | Status: DC

## 2022-01-16 MED ORDER — LEVEMIR FLEXTOUCH 100 UNIT/ML ~~LOC~~ SOPN: 35.0000 [IU] | PEN_INJECTOR | Freq: Two times a day (BID) | SUBCUTANEOUS | 11 refills | Status: DC

## 2022-01-16 MED ORDER — BLOOD GLUCOSE METER KIT: PACK | 0 refills | Status: AC

## 2022-01-16 NOTE — TOC Transition Note (Signed)
Transition of Care [facility]) - CM/SW Discharge Note   Patient Details  Name: LAMIA MARINER MRN: [id number] Date of Birth: Nov 20, 1966  [facility] CM/SW Contact:  Leeroy Cha, RN Phone Number: 01/16/2022, 8:17 AM   Clinical Narrative:    Patient dcd to return home with self care.  Was seen by the diabetic coordinator.    Final next level of care: Home/Self Care Barriers to Discharge: No Barriers Identified   Patient Goals and CMS Choice Patient states their goals for this hospitalization and ongoing recovery are:: to go home CMS Medicare.gov Compare Post Acute Care list provided to:: Patient    Discharge Placement                       Discharge Plan and Services   Discharge Planning Services: CM Consult                                 Social Determinants of Health (SDOH) Interventions     Readmission Risk Interventions No flowsheet data found.

## 2022-01-16 NOTE — Progress Notes (Signed)
Occupational Therapy Treatment Patient Details Name: Deborah [name] MRN: [id number] DOB: 08-Nov-1966 Today's Date: 01/16/2022   History of present illness Patient is a 56 year old female admitted to [facility] on 2/16 with complaints of generalized fatigue, polyuria, polydipsia muscle cramps. Labs in the Ed consistent with DKA.  Now diagnosed with DM, type 2. PMH: CHF, HTN HLD, Obesity, right hand congenital deformity, endometrial cancer   OT comments  Patient overall supervision level for ambulation with rolling walker to/from bathroom and with transfer on/off toilet. Patient needing increased time for mobility and declined further ADLs. Request to get back to bed vs upright for breakfast. Plan is to D/C home with son and daughter in law, no further OT needs at D/C.    Recommendations for follow up therapy are one component of a multi-disciplinary discharge planning process, led by the attending physician.  Recommendations may be updated based on patient status, additional functional criteria and insurance authorization.    Follow Up Recommendations  No OT follow up    Assistance Recommended at Discharge PRN  Patient can return home with the following  A little help with walking and/or transfers;A little help with bathing/dressing/bathroom;Assist for transportation   Equipment Recommendations  None recommended by OT       Precautions / Restrictions Precautions Precautions: None Restrictions Weight Bearing Restrictions: No       Mobility Bed Mobility Overal bed mobility: Needs Assistance Bed Mobility: Supine to Sit, Sit to Supine     Supine to sit: Supervision Sit to supine: Supervision   General bed mobility comments: Increased time needed to sit up right. Able to lift legs back onto edge of bed       Balance Overall balance assessment: Mild deficits observed, not formally tested                                         ADL either performed or assessed with  clinical judgement   ADL Overall ADL's : Needs assistance/impaired                         Toilet Transfer: Supervision/safety;Ambulation;Rolling walker (2 wheels) Toilet Transfer Details (indicate cue type and reason): Patient utilize rolling walker to ambulate to/from bathroom at supervision level. Needs increased time for sit<>stand from edge of bed and toilet         Functional mobility during ADLs: Supervision/safety;Rolling walker (2 wheels) General ADL Comments: Patient declined further ADLs at this time and request to get back to bed.      Cognition Arousal/Alertness: Awake/alert Behavior During Therapy: WFL for tasks assessed/performed Overall Cognitive Status: Within Functional Limits for tasks assessed                                                General Comments VSS on room air    Pertinent Vitals/ Pain       Pain Assessment Pain Assessment: Faces Faces Pain Scale: Hurts a little bit Pain Location: back Pain Descriptors / Indicators: Aching Pain Intervention(s): Monitored during session         Frequency  Min 2X/week        Progress Toward Goals  OT Goals(current goals can now be found in the care plan  section)  Progress towards OT goals: Progressing toward goals  Acute Rehab OT Goals Patient Stated Goal: Home with son and daughter in law OT Goal Formulation: With patient Time For Goal Achievement: 01/26/22 Potential to Achieve Goals: Good ADL Goals Pt Will Perform Lower Body Bathing: Independently;sitting/lateral leans;sit to/from stand Pt Will Perform Lower Body Dressing: Independently;sit to/from stand;sitting/lateral leans Pt Will Transfer to Toilet: Independently;ambulating Additional ADL Goal #1: Patient will tolerate 10 minutes OOB activity in order to participate in self care tasks.  Plan Discharge plan remains appropriate       AM-PAC OT "6 Clicks" Daily Activity     Outcome Measure   Help from another  person eating meals?: None Help from another person taking care of personal grooming?: A Little Help from another person toileting, which includes using toliet, bedpan, or urinal?: A Little Help from another person bathing (including washing, rinsing, drying)?: A Lot Help from another person to put on and taking off regular upper body clothing?: A Little Help from another person to put on and taking off regular lower body clothing?: A Lot 6 Click Score: 17    End of Session Equipment Utilized During Treatment: Rolling walker (2 wheels)  OT Visit Diagnosis: Unsteadiness on feet (R26.81);Other abnormalities of gait and mobility (R26.89)   Activity Tolerance Patient limited by fatigue   Patient Left in bed;with call bell/phone within reach   Nurse Communication Mobility status        Time: 9449-6759 OT Time Calculation (min): 21 min  Charges: OT General Charges $OT Visit: 1 Visit OT Treatments $Self Care/Home Management : 8-22 mins  Delbert Phenix OT OT pager: [place] 01/16/2022, 11:00 AM

## 2022-01-16 NOTE — Progress Notes (Signed)
Inpatient Diabetes Program Recommendations  AACE/ADA: New Consensus Statement on Inpatient Glycemic Control (2015)  Target Ranges:  Prepandial:   less than 140 mg/dL      Peak postprandial:   less than 180 mg/dL (1-2 hours)      Critically ill patients:  140 - 180 mg/dL   Lab Results  Component Value Date   GLUCAP 121 (H) 01/16/2022   HGBA1C >15.5 (H) 01/12/2022    Review of Glycemic Control Semglee 35 units BID Current orders for Inpatient glycemic control: Semglee 35 units BID, Novolog 0-20 TID +8 units TID  Inpatient Diabetes Program Recommendations:    For discharge:  Lantus/Semglee 35 units BID Novolog/Humalog 12 units TID with meals  Pt instructed to check blood sugars 4x/day and take meter/logbook to PCP office visit for review.  Reviewed diet, importance of exercise, stress management, HgbA1C goals. Pt was able to return demonstration on insulin pen administration. Sent video of insulin pen administration to pt's phone. Very appreciative of information.   Discussed above with RN.   Thank you. Lorenda Peck, RD, LDN, CDE Inpatient Diabetes Coordinator [phone]

## 2022-01-16 NOTE — Discharge Summary (Addendum)
Physician Discharge Summary   Patient: Deborah [name] MRN: [id number] DOB: March 09, 1966  Admit date:     01/11/2022  Discharge date: 01/16/22  Discharge Physician: [name]   PCP: [name], DO   Recommendations at discharge:  Advised to follow-up with primary care physician in 1 week. Advised to follow-up with endocrinology as scheduled.   Advised to take Levemir 35 units twice daily. Advised to take NovoLog 10 to 12 units 3 times daily with meals. Counseled in detail about diet and exercise.  Discharge Diagnoses: Principal Problem:   DKA, type 2, not at goal [facility]) Active Problems:   AKI (acute kidney injury) ([place])   Chronic combined systolic and diastolic CHF, NYHA class 2 ([place])   Morbid obesity with BMI of 50.0-59.9, adult (HCC)   Sleep apnea   Essential hypertension   Debility   Pressure injury of skin   Hypokalemia  Resolved Problems:   * No resolved hospital problems. [name] Course: This 56 years old female with PMH significant for CHF, hypertension, hyperlipidemia, morbid obesity presented in the ED with complaints of generalized fatigue, polyuria, polydipsia,  muscle cramps.  She denies any prior history of diabetes.  Labs in the ED consistent with diabetic ketoacidosis.  Blood glucose 1403, BUN 69,  creatinine 3.21, sodium 131, beta hydroxybutyric acid 6.68.  Patient was admitted for diabetic ketoacidosis, HHS.  She was started on insulin gtt. as per DKA protocol, gentle IV hydration,  given history of CHF.  Anion gap has closed, BHBA has normalized.  She was transitioned to [facility] 25 units and sliding scale.  Diabetic coordinator consulted. She does have significant insulin resistance.  Insulin dosage was increased as per blood sugars.  Finally we were successful to control her blood sugar with Levemir 35 units twice daily, NovoLog 12 units 3 times daily.  Patient was explained in detail about compliance with medications and regular follow-ups.  She feels  better has participated in physical therapy recommended home PT.  Patient wants to be discharged,  Patient is being discharged home.  Assessment and Plan: * DKA, type 2, not at goal [facility])- (present on admission) Patient presented with polyuria, polydipsia, generalized fatigue, blood glucose 1408, BHBA 6.68 consistent with DKA. Patient started on DKA protocol, IV hydration, insulin gtt. Anion gap has  closed, BHBA normalized. Started on carb modified diet, transitioned on subcu insulin.  Consulted diabetic coordinator. Her blood sugar has been fluctuating , Initaited on Semglee 30 units in the morning and 15 at bedtime. Sliding scale changed to resistant scale.  Continues to have persistently elevated blood sugars. Increase Semglee to 35 units twice daily, Novolog 8 units with meals , Continue resistant sliding scale. Continue to monitor fingersticks and serum electrolytes. FS 175 - 156- 167   AKI (acute kidney injury) ([name])- (present on admission) Baseline serum creatinine normal, presented with serum creatinine 3.72 Likely in the setting of DKA.  Avoid nephrotoxic medications. Continue IV hydration.  Creatinine improving 3.21> 3.18>2.70>1.57>1.21>1.20 Renal ultrasound without obstruction.  Chronic combined systolic and diastolic CHF, NYHA class 2 ([name])- (present on admission) She appeared dehydrated, does not appear volume overloaded. Continue IV hydration, remain conscious of history of CHF. Last LVEF 45 to 50% in 2019. Repeat echo shows LVEF 55- 60%.  Hypokalemia Replaced.  Continue to monitor  Debility- (present on admission) Needs PT and OT evaluation.  Essential hypertension- (present on admission) Hold Coreg as BP is soft.  Sleep apnea- (present on admission) Patient denies using CPAP  in the past.  Morbid obesity with BMI of 50.0-59.9, adult ([name]) Diet and exercise discussed in detail.   Consultants: None Procedures performed: None Disposition: Home Diet  recommendation:  Discharge Diet Orders (From admission, onward)     Start     Ordered   01/16/22 0000  Diet - low sodium heart healthy        01/16/22 1040           Carb modified diet  DISCHARGE MEDICATION: Allergies as of 01/16/2022       Reactions   Lactose Intolerance (gi) Diarrhea        Medication List     TAKE these medications    acetaminophen 500 MG tablet Commonly known as: TYLENOL Take 1,000 mg by mouth every 6 (six) hours as needed for moderate pain (pain).   blood glucose meter kit and supplies Dispense based on patient and insurance preference. Use up to four times daily as directed. (FOR ICD-10 E10.9, E11.9).   carvedilol 3.125 MG tablet Commonly known as: COREG Take 1 tablet (3.125 mg total) by mouth every morning AND 2 tablets (6.25 mg total) at bedtime.   diazepam 5 MG tablet Commonly known as: VALIUM TAKE 1 TABLET(5 MG) BY MOUTH EVERY 12 HOURS AS NEEDED FOR ANXIETY What changed: See the new instructions.   diclofenac Sodium 1 % Gel Commonly known as: Voltaren Apply 2 g topically 4 (four) times daily. What changed:  when to take this reasons to take this   insulin aspart 100 UNIT/ML FlexPen Commonly known as: NOVOLOG Inject 10 Units into the skin 3 (three) times daily with meals.   Insulin Pen Needle 32G X 4 MM Misc Use as directed.   Levemir FlexTouch 100 UNIT/ML FlexPen Generic drug: insulin detemir Inject 35 Units into the skin 2 (two) times daily.   meclizine 25 MG tablet Commonly known as: ANTIVERT TAKE 1 TABLET(25 MG) BY MOUTH THREE TIMES DAILY AS NEEDED FOR DIZZINESS. This will be your last refill until you are seen in the office. What changed:  how much to take how to take this when to take this reasons to take this additional instructions   methocarbamol 500 MG tablet Commonly known as: ROBAXIN Take 1 tablet (500 mg total) by mouth 3 (three) times daily.   omeprazole 20 MG capsule Commonly known as: PRILOSEC TAKE  1 CAPSULE(20 MG) BY MOUTH DAILY What changed: See the new instructions.   polyethylene glycol powder 17 GM/SCOOP powder Commonly known as: GLYCOLAX/MIRALAX Take 17 g by mouth daily as needed. What changed: reasons to take this        Follow-up Information     [name], DO Follow up in 1 week(s).   Specialty: Family Medicine Contact information: [place] [address] [phone]         Minus Breeding, MD .   Specialty: Cardiology Contact information: [address] [place] [address] [phone]         Elayne Snare, MD Follow up in 1 week(s).   Specialty: Endocrinology Contact information: [address]  [place] [address] [phone]                 Discharge Exam: Filed Weights   01/12/22 0000 01/15/22 1020  Weight: 122.2 kg 123.3 kg   General exam: Appears comfortable, not in any acute distress.  Deconditioned. Respiratory system: Clear to auscultation bilaterally, no wheezing, no crackles, normal respiratory effort. Cardiovascular system: S1-S2 heard,  regular rate and rhythm, no murmur. Gastrointestinal system: .  Abdominal soft, nontender, nondistended, BS+ Central nervous system: Alert and oriented x3, no neurological deficits. Extremities:, No cyanosis, no clubbing. Psychiatry: Mood, judgment, insight normal   Condition at discharge: stable  The results of significant diagnostics from this hospitalization (including imaging, microbiology, ancillary and laboratory) are listed below for reference.   Imaging Studies: US RENAL  Result Date: 01/12/2022 CLINICAL DATA:  Acute kidney injury EXAM: RENAL / URINARY TRACT ULTRASOUND COMPLETE COMPARISON:  None. FINDINGS: Right Kidney: Renal measurements: 11.1 x 5.3 x 5.6 cm = volume: 168 mL. Echogenicity within normal limits. No mass or hydronephrosis visualized. Left Kidney: Renal measurements: 11.6 x 4.8 x 5.1 cm = volume: 148 mL. Echogenicity  within normal limits. No mass or hydronephrosis visualized. Bladder: Appears normal for degree of bladder distention. Other: Hyperechoic hepatic parenchyma, suggesting hepatic steatosis. IMPRESSION: Negative renal ultrasound.  No hydronephrosis. Electronically Signed   By: Julian Hy M.D.   On: 01/12/2022 03:27   DG CHEST PORT 1 VIEW  Result Date: 01/11/2022 CLINICAL DATA:  Hyperglycemia EXAM: PORTABLE CHEST 1 VIEW COMPARISON:  04/23/2021 FINDINGS: Patient is rotated. Lungs are clear. No pleural effusion or pneumothorax. Heart is normal in size. IMPRESSION: No evidence of acute cardiopulmonary disease. Electronically Signed   By: Julian Hy M.D.   On: 01/11/2022 22:14   ECHOCARDIOGRAM COMPLETE  Result Date: 01/12/2022    ECHOCARDIOGRAM REPORT   Patient Name:   Deborah [name] Date of Exam: 01/12/2022 Medical Rec #:  [id number]       Height:       65.0 in Accession #:    [id number]      Weight:       269.4 lb Date of Birth:  1966-05-18       BSA:          2.245 m Patient Age:    23 years        BP:           87/46 mmHg Patient Gender: F               HR:           90 bpm. Exam Location:  Inpatient Procedure: 2D Echo, Cardiac Doppler and Color Doppler Indications:    I50.31 CHF  History:        Patient has prior history of Echocardiogram examinations, most                 recent 08/12/2014. Risk Factors:Hypertension, Dyslipidemia and                 Diabetes. OBESITY.  Sonographer:    Beryle Beams Referring Phys: [id number] ANASTASSIA DOUTOVA  Sonographer Comments: Patient is morbidly obese, suboptimal parasternal window, suboptimal apical window and no subcostal window. Image acquisition challenging due to uncooperative patient and Image acquisition challenging due to patient body habitus. PATIENT REFUSED TO ROLL UP ON LT SIDE/DUE TO PAIN IN ARMS IMPRESSIONS  1. Left ventricular ejection fraction, by estimation, is 55 to 60%. The left ventricle has normal function. The left ventricle has no regional  wall motion abnormalities. Left ventricular diastolic parameters are consistent with Grade I diastolic dysfunction (impaired relaxation).  2. Peak RV-RA 12 mmHg. The IVC was not visualized. Right ventricular systolic function is normal. The right ventricular size is normal.  3. The mitral valve is normal in structure. No evidence of mitral valve regurgitation. No evidence of mitral stenosis.  4. The aortic valve  is tricuspid. Aortic valve regurgitation is not visualized. No aortic stenosis is present.  5. Technically difficult study with poor acoustic windows. FINDINGS  Left Ventricle: Left ventricular ejection fraction, by estimation, is 55 to 60%. The left ventricle has normal function. The left ventricle has no regional wall motion abnormalities. The left ventricular internal cavity size was normal in size. There is  no left ventricular hypertrophy. Left ventricular diastolic parameters are consistent with Grade I diastolic dysfunction (impaired relaxation). Right Ventricle: Peak RV-RA 12 mmHg. The IVC was not visualized. The right ventricular size is normal. No increase in right ventricular wall thickness. Right ventricular systolic function is normal. Left Atrium: Left atrial size was normal in size. Right Atrium: Right atrial size was normal in size. Pericardium: There is no evidence of pericardial effusion. Mitral Valve: The mitral valve is normal in structure. No evidence of mitral valve regurgitation. No evidence of mitral valve stenosis. Tricuspid Valve: The tricuspid valve is normal in structure. Tricuspid valve regurgitation is trivial. Aortic Valve: The aortic valve is tricuspid. Aortic valve regurgitation is not visualized. No aortic stenosis is present. Aortic valve mean gradient measures 2.0 mmHg. Aortic valve peak gradient measures 3.3 mmHg. Aortic valve area, by VTI measures 1.62 cm. Pulmonic Valve: The pulmonic valve was not well visualized. Pulmonic valve regurgitation is not visualized. Aorta:  The aortic root is normal in size and structure. Venous: The inferior vena cava was not well visualized. IAS/Shunts: No atrial level shunt detected by color flow Doppler.  LEFT VENTRICLE PLAX 2D LVIDd:         4.50 cm     Diastology LVIDs:         2.80 cm     LV e' medial:    4.79 cm/s LV PW:         1.10 cm     LV E/e' medial:  7.2 LV IVS:        0.80 cm     LV e' lateral:   8.05 cm/s LVOT diam:     1.80 cm     LV E/e' lateral: 4.3 LV SV:         24 LV SV Index:   11 LVOT Area:     2.54 cm  LV Volumes (MOD) LV vol d, MOD A2C: 38.1 ml LV vol d, MOD A4C: 31.6 ml LV vol s, MOD A2C: 16.9 ml LV vol s, MOD A4C: 19.6 ml LV SV MOD A2C:     21.2 ml LV SV MOD A4C:     31.6 ml LV SV MOD BP:      17.0 ml RIGHT VENTRICLE RV S prime:     10.70 cm/s TAPSE (M-mode): 1.0 cm LEFT ATRIUM             Index       RIGHT ATRIUM          Index LA diam:        3.40 cm 1.51 cm/m  RA Area:     7.90 cm LA Vol (A2C):   18.7 ml 8.33 ml/m  RA Volume:   12.00 ml 5.34 ml/m LA Vol (A4C):   17.9 ml 7.97 ml/m LA Biplane Vol: 18.3 ml 8.15 ml/m  AORTIC VALVE                    PULMONIC VALVE AV Area (Vmax):    1.62 cm     PV Vmax:       0.57 m/s AV Area (  Vmean):   1.43 cm     PV Vmean:      32.500 cm/s AV Area (VTI):     1.62 cm     PV VTI:        0.082 m AV Vmax:           90.40 cm/s   PV Peak grad:  1.3 mmHg AV Vmean:          67.300 cm/s  PV Mean grad:  1.0 mmHg AV VTI:            0.148 m AV Peak Grad:      3.3 mmHg AV Mean Grad:      2.0 mmHg LVOT Vmax:         57.40 cm/s LVOT Vmean:        37.900 cm/s LVOT VTI:          0.094 m LVOT/AV VTI ratio: 0.64  AORTA Ao Root diam: 2.80 cm MITRAL VALVE               TRICUSPID VALVE MV Area (PHT): 3.34 cm    TR Peak grad:   11.7 mmHg MV Decel Time: 227 msec    TR Vmax:        171.00 cm/s MV E velocity: 34.43 cm/s MV A velocity: 41.30 cm/s  SHUNTS MV E/A ratio:  0.83        Systemic VTI:  0.09 m                            Systemic Diam: 1.80 cm Dalton McleanMD Electronically signed by Franki Monte Signature Date/Time: 01/12/2022/3:41:48 PM    Final     Microbiology: Results for orders placed or performed during the hospital encounter of 01/11/22  MRSA Next Gen by PCR, Nasal     Status: None   Collection Time: 01/11/22 12:18 AM   Specimen: Nasal Mucosa; Nasal Swab  Result Value Ref Range Status   MRSA by PCR Next Gen NOT DETECTED NOT DETECTED Final    Comment: (NOTE) The GeneXpert MRSA Assay (FDA approved for NASAL specimens only), is one component of a comprehensive MRSA colonization surveillance program. It is not intended to diagnose MRSA infection nor to guide or monitor treatment for MRSA infections. Test performance is not FDA approved in patients less than 74 years old. Performed at [facility], [place] [address] [place], [place] [address]   Resp Panel by RT-PCR (Flu A&B, Covid) Nasopharyngeal Swab     Status: None   Collection Time: 01/11/22 10:50 PM   Specimen: Nasopharyngeal Swab; Nasopharyngeal(NP) swabs in vial transport medium  Result Value Ref Range Status   SARS Coronavirus 2 by RT PCR NEGATIVE NEGATIVE Final    Comment: (NOTE) SARS-CoV-2 target nucleic acids are NOT DETECTED.  The SARS-CoV-2 RNA is generally detectable in upper respiratory specimens during the acute phase of infection. The lowest concentration of SARS-CoV-2 viral copies this assay can detect is 138 copies/mL. A negative result does not preclude SARS-Cov-2 infection and should not be used as the sole basis for treatment or other patient management decisions. A negative result may occur with  improper specimen collection/handling, submission of specimen other than nasopharyngeal swab, presence of viral mutation(s) within the areas targeted by this assay, and inadequate number of viral copies(<138 copies/mL). A negative result must be combined with clinical observations, patient history, and epidemiological information. The expected result is Negative.  Fact Sheet  for Patients:  EntrepreneurPulse.com.au  Fact Sheet for Healthcare Providers:  IncredibleEmployment.be  This test is no t yet approved or cleared by the [place] FDA and  has been authorized for detection and/or diagnosis of SARS-CoV-2 by FDA under an Emergency Use Authorization (EUA). This EUA will remain  in effect (meaning this test can be used) for the duration of the COVID-19 declaration under Section 564(b)(1) of the Act, 21 U.S.C.section 360bbb-3(b)(1), unless the authorization is terminated  or revoked sooner.       Influenza A by PCR NEGATIVE NEGATIVE Final   Influenza B by PCR NEGATIVE NEGATIVE Final    Comment: (NOTE) The Xpert Xpress SARS-CoV-2/FLU/RSV plus assay is intended as an aid in the diagnosis of influenza from Nasopharyngeal swab specimens and should not be used as a sole basis for treatment. Nasal washings and aspirates are unacceptable for Xpert Xpress SARS-CoV-2/FLU/RSV testing.  Fact Sheet for Patients: EntrepreneurPulse.com.au  Fact Sheet for Healthcare Providers: IncredibleEmployment.be  This test is not yet approved or cleared by the [place] FDA and has been authorized for detection and/or diagnosis of SARS-CoV-2 by FDA under an Emergency Use Authorization (EUA). This EUA will remain in effect (meaning this test can be used) for the duration of the COVID-19 declaration under Section 564(b)(1) of the Act, 21 U.S.C. section 360bbb-3(b)(1), unless the authorization is terminated or revoked.  Performed at [facility], [place] [address] [place],  [address]     Labs: CBC: Recent Labs  Lab 01/11/22 1914 01/13/22 0251 01/14/22 0239 01/16/22 0231  WBC 9.3 13.9* 9.1 6.5  NEUTROABS 6.8  --   --   --   HGB 15.4* 15.0 12.7 12.5  HCT 51.6* 45.2 39.7 39.1  MCV 93.3 85.0 85.9 87.9  PLT 250 198 150 944*   Basic Metabolic Panel: Recent Labs   Lab 01/11/22 1914 01/12/22 0000 01/12/22 0734 01/12/22 1607 01/13/22 0251 01/14/22 0239 01/15/22 0223 01/16/22 0231  NA 131* 138   < > 139 142 141 141 136  K 4.5 3.5   < > 3.1* 3.3* 3.1* 2.7* 2.8*  CL 86* 96*   < > 102 101 102 103 102  CO2 17* 18*   < > 21* $Rem'27 24 27 23  'FTDG$ GLUCOSE 1,403* 940*   < > 430* 382* 435* 175* 120*  BUN 69* 71*   < > 75* 72* 55* 36* 22*  CREATININE 3.21* 3.18*   < > 3.01* 2.70* 1.57* 1.21* 1.20*  CALCIUM 8.2* 8.2*   < > 8.2* 8.7* 8.4* 8.6* 7.8*  MG 2.8*  --   --   --  2.5* 2.0  --  1.4*  PHOS  --  5.0*  --   --  3.6 2.8  --  1.7*   < > = values in this interval not displayed.   Liver Function Tests: Recent Labs  Lab 01/12/22 0000  AST 23  ALT 19  ALKPHOS 92  BILITOT 0.7  PROT 8.2*  ALBUMIN 3.3*   CBG: Recent Labs  Lab 01/15/22 1552 01/15/22 2133 01/16/22 0747 01/16/22 1045 01/16/22 1557  GLUCAP 169* 107* 121* 199* 245*    Discharge time spent: greater than 30 minutes.  Signed: [name], MD [facility] 01/16/2022

## 2022-01-16 NOTE — TOC Transition Note (Addendum)
Transition of Care [facility]) - CM/SW Discharge Note   Patient Details  Name: Deborah [name] MRN: [id number] Date of Birth: 01-19-1966  [facility] CM/SW Contact:  Leeroy Cha, RN Phone Number: 01/16/2022, 12:58 PM   Clinical Narrative:    [facility] called for  home p.t./uinable to take due to medicaid, [facility] can not take, advance can not take. Unable to get hhc for this patient.   Final next level of care: [place] Barriers to Discharge: No Barriers Identified   Patient Goals and CMS Choice Patient states their goals for this hospitalization and ongoing recovery are:: to go home CMS Medicare.gov Compare Post Acute Care list provided to:: Patient    Discharge Placement                       Discharge Plan and Services   Discharge Planning Services: CM Consult                                 Social Determinants of Health (SDOH) Interventions     Readmission Risk Interventions No flowsheet data found.

## 2022-01-16 NOTE — Discharge Instructions (Signed)
Advised to follow-up with primary care physician in 1 week. Advised to follow-up with endocrinology as scheduled.   Advised to take Levemir 35 units twice daily. Advised to take NovoLog 10 to 12 units 3 times daily with meals. Counseled in detail about diet and exercise.

## 2022-01-16 NOTE — Progress Notes (Signed)
Reviewed d/c instructions with pt and son at bedside, reviewed education on diabetes, insulin and blood sugar checks. Pt and son verbalizes understanding. Pt educated on diet and importance of checking blood sugars as prescribed. No concerns noted, PIV d/c. Pressure dressing applied. [facility] called and verified that meds are ready for pick up

## 2022-01-16 NOTE — Progress Notes (Signed)
[facility] ADULT ICU REPLACEMENT PROTOCOL   The patient does apply for the [facility] Adult ICU Electrolyte Replacment Protocol based on the criteria listed below:   1.Exclusion criteria: TCTS patients, ECMO patients, and Dialysis patients 2. Is GFR >/= 30 ml/min? Yes.    Patient's GFR today is 54 3. Is SCr </= 2? Yes.   Patient's SCr is 1.2 mg/dL 4. Did SCr increase >/= 0.5 in 24 hours? No. 5.Pt's weight >40kg  Yes.   6. Abnormal electrolyte(s): Mag 1.4  7. Electrolytes replaced per protocol 8.  Call MD STAT for K+ </= 2.5, Phos </= 1, or Mag </= 1 Physician:    Billy Fischer 01/16/2022 6:17 AM

## 2022-01-17 ENCOUNTER — Telehealth: Payer: Self-pay

## 2022-01-17 NOTE — Telephone Encounter (Signed)
Transition Care Management Unsuccessful Follow-up Telephone Call  Date of discharge and from where:  01/16/2022-[facility]   Attempts:  1st Attempt  Reason for unsuccessful TCM follow-up call:  Left voice message

## 2022-01-18 ENCOUNTER — Telehealth: Payer: Self-pay

## 2022-01-18 NOTE — Telephone Encounter (Signed)
Please advise her no changes to medications at this time. If she has not been taking it, she can stop taking it.

## 2022-01-18 NOTE — Telephone Encounter (Signed)
Transition Care Management Unsuccessful Follow-up Telephone Call  Date of discharge and from where:  01/16/2022 from [facility]  Attempts:  2nd Attempt  Reason for unsuccessful TCM follow-up call:  Left voice message

## 2022-01-18 NOTE — Telephone Encounter (Signed)
Patient calls nurse line regarding hospital follow up. Patient has questions as to if she is to continue her carvedilol and diazepam.   Recommended that patient call cardiologist for further clarification on carvedilol and dosing. Diazepam is still listed on medication list PRN. Please advise if patient should make any changes with this medication.   I also recommended that patient schedule hospital follow up. Offered to schedule this for patient, however, patient states that she will need to call back after she speaks with her family.   Talbot Grumbling, RN

## 2022-01-19 NOTE — Telephone Encounter (Signed)
Transition Care Management Unsuccessful Follow-up Telephone Call  Date of discharge and from where:  01/16/2022-[facility]  Attempts:  3rd Attempt  Reason for unsuccessful TCM follow-up call:  Left voice message

## 2022-01-23 NOTE — Telephone Encounter (Signed)
Called patient to inform of below.   Patient did not answer, LVM for patient to return call to provide with provider recommendations.   Talbot Grumbling, RN

## 2022-02-01 NOTE — Progress Notes (Deleted)
?  ?Cardiology Office Note ? ? ?Date:  02/01/2022  ? ?ID:  Deborah [name], DOB [birth year], MRN [id number] ? ?PCP:  [name], DO  ?Cardiologist:   Minus Breeding, MD ? ? ?No chief complaint on file. ? ? ?  ?History of Present Illness:  ?Deborah [name] is a 56 y.o. female who presents for follow up of a reduced EF.  She was seen here in 2000 and and did have a stress perfusion study. This demonstrated possibly a reduced ejection fraction of 47%. There was artifact. There was perhaps some hypoperfusion of the anterior septum. Cardiac catheterization was planned but this did not happen. I saw her in 2016 and I ordered dobutamine echocardiogram. This confirmed the ejection fraction was somewhat low at 35%. I titrated meds and in 2016 her EF was low normal (45 - 50%) and again in 2019.    ? ?Since I last saw her *** ? ?***  she has done well.  She has rare palpitations.  She walks to the mailbox in the dumpster and she has no limitations with this. The patient denies any new symptoms such as chest discomfort, neck or arm discomfort. There has been no new shortness of breath, PND or orthopnea. There have been no reported palpitations, presyncope or syncope.  ? ? ? ?Past Medical History:  ?Diagnosis Date  ? Anemia   ? Anxiety   ? Arthritis   ? ,Knees, back  ? Blood in stool 08/28/2012  ? 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 Depression   ? Educated about COVID-19 virus infection 04/16/2019  ? Endometrial cancer ([place])   ? 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 GASTROESOPHAGEAL REFLUX, NO ESOPHAGITIS 01/23/2007  ? Qualifier: Diagnosis of  By: Herma Ard    ? HLD (hyperlipidemia)   ? borderline  ? Hx of echocardiogram   ? Echo (9/15): EF 50-55%, normal wall motion, grade 2 diastolic dysfunction, mild LAE  ? Hx of radiation therapy 04/07/13- 05/14/13  ? pelvis 45 gray 25 fx  ? Hypertension   ? Obesity   ? Swelling   ? ANKLES - TAKES LASIX  ? Wrist pain 08/28/2012  ? ? ?Past Surgical History:  ?Procedure Laterality Date  ?  ABDOMINAL HYSTERECTOMY  10/2011  ? complete  ? CHOLECYSTECTOMY N/A 07/08/2015  ? Procedure: LAPAROSCOPIC CHOLECYSTECTOMY WITH INTRAOPERATIVE CHOLANGIOGRAM;  Surgeon: Judeth Horn, MD;  Location: [facility];  Service: General;  Laterality: N/A;  ? HIATAL HERNIA REPAIR    ? REPAIR VAGINAL CUFF  10/07/2012  ? Procedure: REPAIR VAGINAL CUFF;  Surgeon: [name], MD PHD;  Location: [facility];  Service: Gynecology;;  ? ROBOTIC ASSISTED TOTAL HYSTERECTOMY WITH BILATERAL SALPINGO OOPHERECTOMY  10/07/2012  ? Procedure: ROBOTIC ASSISTED TOTAL HYSTERECTOMY WITH BILATERAL SALPINGO OOPHORECTOMY;  Surgeon: [name], MD PHD;  Location: [facility];  Service: Gynecology;  Laterality: Bilateral;  ? ? ? ?Current Outpatient Medications  ?Medication Sig Dispense Refill  ? acetaminophen (TYLENOL) 500 MG tablet Take 1,000 mg by mouth every 6 (six) hours as needed for moderate pain (pain).    ? blood glucose meter kit and supplies Dispense based on patient and insurance preference. Use up to four times daily as directed. (FOR ICD-10 E10.9, E11.9). 1 each 0  ? carvedilol (COREG) 3.125 MG tablet Take 1 tablet (3.125 mg total) by mouth every morning AND 2 tablets (6.25 mg total) at bedtime. 270 tablet 0  ? diazepam (VALIUM) 5 MG tablet TAKE 1 TABLET(5 MG) BY MOUTH EVERY  12 HOURS AS NEEDED FOR ANXIETY (Patient taking differently: Take 5 mg by mouth daily as needed for anxiety.) 30 tablet 0  ? diclofenac Sodium (VOLTAREN) 1 % GEL Apply 2 g topically 4 (four) times daily. (Patient taking differently: Apply 2 g topically daily as needed (for pain).) 100 g 0  ? insulin aspart (NOVOLOG) 100 UNIT/ML FlexPen Inject 10 Units into the skin 3 (three) times daily with meals. 15 mL 11  ? insulin detemir (LEVEMIR FLEXTOUCH) 100 UNIT/ML FlexPen Inject 35 Units into the skin 2 (two) times daily. 15 mL 11  ? Insulin Pen Needle 32G X 4 MM MISC Use as directed. 360 each 3  ? meclizine (ANTIVERT) 25 MG tablet TAKE 1 TABLET(25 MG) BY MOUTH THREE TIMES DAILY AS  NEEDED FOR DIZZINESS. This will be your last refill until you are seen in the office. (Patient taking differently: Take 25 mg by mouth 3 (three) times daily as needed for dizziness. This will be your last refill until you are seen in the office.) 45 tablet 0  ? methocarbamol (ROBAXIN) 500 MG tablet Take 1 tablet (500 mg total) by mouth 3 (three) times daily. 30 tablet 0  ? omeprazole (PRILOSEC) 20 MG capsule TAKE 1 CAPSULE(20 MG) BY MOUTH DAILY (Patient taking differently: Take 20 mg by mouth daily.) 90 capsule 0  ? polyethylene glycol powder (GLYCOLAX/MIRALAX) 17 GM/SCOOP powder Take 17 g by mouth daily as needed. (Patient taking differently: Take 17 g by mouth daily as needed for mild constipation.) 3350 g 1  ? ?No current facility-administered medications for this visit.  ? ? ?Allergies:   Lactose intolerance (gi)  ? ?ROS:  Please see the history of present illness.   Otherwise, review of systems are positive for ***.   All other systems are reviewed and negative.  ? ? ?PHYSICAL EXAM: ?VS:  LMP 09/20/2012  , BMI There is no height or weight on file to calculate BMI. ?GENERAL:  Well appearing ?NECK:  No jugular venous distention, waveform within normal limits, carotid upstroke brisk and symmetric, no bruits, no thyromegaly ?LUNGS:  Clear to auscultation bilaterally ?CHEST:  Unremarkable ?HEART:  PMI not displaced or sustained,S1 and S2 within normal limits, no S3, no S4, no clicks, no rubs, *** murmurs ?ABD:  Flat, positive bowel sounds normal in frequency in pitch, no bruits, no rebound, no guarding, no midline pulsatile mass, no hepatomegaly, no splenomegaly ?EXT:  2 plus pulses throughout, no edema, no cyanosis no clubbing ? ? ? ? ?***GENERAL:  Well appearing ?NECK:  No jugular venous distention, waveform within normal limits, carotid upstroke brisk and symmetric, no bruits, no thyromegaly ?LUNGS:  Clear to auscultation bilaterally ?CHEST:  Unremarkable ?HEART:  PMI not displaced or sustained,S1 and S2 within  normal limits, no S3, no S4, no clicks, no rubs, no murmurs ?ABD:  Flat, positive bowel sounds normal in frequency in pitch, no bruits, no rebound, no guarding, no midline pulsatile mass, no hepatomegaly, no splenomegaly ?EXT:  2 plus pulses throughout, no edema, no cyanosis no clubbing, absent digits congenitally right hand ? ? ?EKG:  EKG is *** ordered today. ?The ekg ordered today demonstrates NSR rate ***, axis within normal limits, intervals within normal limits, no acute ST-T wave changes. ? ? ?Recent Labs: ?01/12/2022: ALT 19; TSH 0.094 ?01/16/2022: BUN 22; Creatinine, Ser 1.20; Hemoglobin 12.5; Magnesium 1.4; Platelets 147; Potassium 2.8; Sodium 136  ? ? ?Lipid Panel ?   ?Component Value Date/Time  ? CHOL 165 06/14/2021 1437  ? TRIG   91 06/14/2021 1437  ? HDL 46 06/14/2021 1437  ? CHOLHDL 3.6 06/14/2021 1437  ? CHOLHDL 5.1 07/17/2013 0923  ? VLDL 30 07/17/2013 0923  ? LDLCALC 102 (H) 06/14/2021 1437  ? LDLDIRECT 130 (H) 02/14/2012 1424  ? ?  ? ?Wt Readings from Last 3 Encounters:  ?01/15/22 271 lb 13.2 oz (123.3 kg)  ?06/14/21 (!) 305 lb 12.8 oz (138.7 kg)  ?04/23/21 298 lb (135.2 kg)  ?  ? ? ?Other studies Reviewed: ?Additional studies/ records that were reviewed today include: ***. ?Review of the above records demonstrates:  Please see elsewhere in the note.   ? ? ?ASSESSMENT AND PLAN: ? ?Chronic systolic and diastolic HF:   ***   The patient seems to be euvolemic.  I am going to check an echocardiogram in September as it has been 1 year.  No change in therapy otherwise.  ?  ?Hypertension:   Her blood pressure is *** controlled.  She will continue on the meds as listed. ?  ?Anemia:   ***  She is to have follow-up with her primary provider.  I do not see a recent CBC.  I will defer management.  ? ?Current medicines are reviewed at length with the patient today.  The patient does not have concerns regarding medicines. ? ?The following changes have been made:  *** ? ?Labs/ tests ordered today include: ***  ? ?No  orders of the defined types were placed in this encounter. ? ? ? ?Disposition:   FU with me in *** ? ? ?Signed, ?Minus Breeding, MD  ?02/01/2022 8:40 PM    ?[place] ? ? ? ?

## 2022-02-02 ENCOUNTER — Ambulatory Visit: Payer: Medicaid Other | Admitting: Cardiology

## 2022-02-05 ENCOUNTER — Telehealth: Payer: Self-pay

## 2022-02-05 MED ORDER — ACCU-CHEK SOFTCLIX LANCETS MISC: 12 refills | Status: DC

## 2022-02-05 MED ORDER — ACCU-CHEK GUIDE VI STRP: ORAL_STRIP | 12 refills | Status: DC

## 2022-02-05 NOTE — Telephone Encounter (Signed)
Patient calls nurse line reporting lancets and test strips were not sent to her pharmacy at discharge.  ? ?Patient reports she received Accu Chek meter, however no accessories.  ? ?Will send to preferred pharmacy.  ?

## 2022-02-13 ENCOUNTER — Encounter: Payer: Self-pay | Admitting: Family Medicine

## 2022-02-13 ENCOUNTER — Ambulatory Visit (INDEPENDENT_AMBULATORY_CARE_PROVIDER_SITE_OTHER): Payer: Medicaid Other | Admitting: Family Medicine

## 2022-02-13 ENCOUNTER — Other Ambulatory Visit: Payer: Self-pay

## 2022-02-13 VITALS — BP 134/74 | Wt 293.0 lb

## 2022-02-13 DIAGNOSIS — R42 Dizziness and giddiness: Secondary | ICD-10-CM | POA: Diagnosis not present

## 2022-02-13 DIAGNOSIS — E119 Type 2 diabetes mellitus without complications: Secondary | ICD-10-CM | POA: Diagnosis not present

## 2022-02-13 MED ORDER — MECLIZINE HCL 25 MG PO TABS: ORAL_TABLET | ORAL | 1 refills | Status: DC

## 2022-02-13 NOTE — Progress Notes (Signed)
? ? ?  SUBJECTIVE:  ? ?CHIEF COMPLAINT / HPI:  ? ?Ms. Needles is a 56 year old who presents for a hospital follow up.  ? ?She was hospitalized from 2/16-2/21 due to DKA. Her a1c was 15.5, She was ultimately discharged on Levemir 35 units twice daily, NovoLog 12 units 3 times daily.  She was advised to follow-up with endocrinology as scheduled.  Today she is accompanied by her son. He states she wasn't eating right before the hospitalization and was drinking a lot of sodas and sweet tea. Now eating baked and grilled chicken, salads, yogurt, fruits.She has been following the insulin regimen  ? ?She states she sees heart doctor but was never told she had CHF though in the hospital they told her son she had CHF. She states she would know if someone told her because one of her family members had it.  ? ?Additionally states she was diagnosed with vertigo, improved a little with Meclizine. Feels off balance and dizzy. Would like to see specialist. Does take Meclizine '25mg'$  three times daily and requests refill sent to [facility]. She requests an increased dose.  ? ?Requests a list of eye doctors that takes medicaid.  ? ? ?OBJECTIVE:  ? ?LMP 09/20/2012   ?General: alert, pleasant, NAD ?CV: RRR no murmurs ?Resp: CTAB normal WOB ?GI: soft, non distended  ?Derm: no LE edema  ? ?ASSESSMENT/PLAN:  ? ?No problem-specific Assessment & Plan notes found for this encounter. ?  ?DM2 ?A1c 15.5 one month prior, she was hospitalized a month ago for DKA and was discharged on Levemir 35 units twice daily, NovoLog 12 units 3 times daily.She endorses following that regimen and eating much better with the help of her son. Discussed an eating schedule and the timing of her insulin. Scheduled appt with pharmacy team as well. She was also advised to follow up with her endocrinologist. Advised to follow up with me in 3 months for next DM follow up or sooner if needed  ? ?Vertigo ?Sx mildly improved with Meclizine '25mg'$  TID. Requests increase in  dose. Told her she could try 1-2 tablets TID prn. Also referred to vestibular rehab ? ?Health maintenance ?- needs diabetic eye exam. Referral placed  ? ?[name], DO ?[facility] [place]   ?

## 2022-02-13 NOTE — Patient Instructions (Signed)
It was great seeing you today! ? ?You came in to follow up on your hospitalization. Good work with adjusting your diet and being on top of taking you insulin. I recommend 4-5 hours from breakfast, lunch, and dinner. So breakfast around 9am, lunch between 1-2 pm, and dinner between 5-6 pm and a bedtime snack. We are scheduling you an appointment with our pharmacy team to discuss in more detail and follow up on your insulin regimen. ? ?I recommend scheduling an appointment with your cardiologist to follow up. ? ?I have also provided a list of eye doctors for you.  ? ?Please check-out at the front desk before leaving the clinic. We will see you in 1 month for pharmacy follow up and I will see you in 3 months for diabetes follow up but if you need to be seen earlier than that for any new issues we're happy to fit you in, just give [place] a call! ? ?Visit Reminders: ?- Stop by the pharmacy to pick up your prescriptions  ?- Continue to work on your healthy eating habits and incorporating exercise into your daily life.  ? ?Feel free to call with any questions or concerns at any time, at [phone]. ?  ?Take care,  ?Dr. [name] ?[place]  ?

## 2022-02-14 ENCOUNTER — Telehealth: Payer: Self-pay

## 2022-02-14 NOTE — Telephone Encounter (Signed)
Patient LVM on nurse line requesting a call back to discuss Meclizine. ? ?Patient reports she asked for a 90 day supply, however #45 was called in.  ? ?I attempted to call patient back, however no answer.  ? ?Will forward to provider. ?

## 2022-02-15 ENCOUNTER — Other Ambulatory Visit: Payer: Self-pay | Admitting: Family Medicine

## 2022-02-15 MED ORDER — MECLIZINE HCL 25 MG PO TABS: ORAL_TABLET | ORAL | 0 refills | Status: DC

## 2022-02-15 NOTE — Progress Notes (Unsigned)
Patient requesting 90 tablets instead of 45 to hold her until she sees vestibular rehab due to it being more affordable and she is going to try taking 2 tablets instead of 1 to see if that helps.  ?

## 2022-02-15 NOTE — Telephone Encounter (Signed)
Pt calls to check status. Advised of message from Dr. Arby Barrette. ? ?Pt reports that she did not pick up the #45 because Dr. Arby Barrette told her to take 2 tablets every 6-8 hours and she is afraid that she will run out before she hears from vestibular rehab. ? ?She would prefer to have #90 because it is cheaper for her and hopefully will last her until she hears from Rehab. ? ?Christen Bame, CMA ? ?

## 2022-02-16 NOTE — Telephone Encounter (Signed)
LMOVM informing patient. Christen Bame, CMA ? ?

## 2022-02-20 ENCOUNTER — Other Ambulatory Visit: Payer: Self-pay | Admitting: Cardiology

## 2022-02-21 ENCOUNTER — Other Ambulatory Visit: Payer: Self-pay | Admitting: Cardiology

## 2022-02-22 ENCOUNTER — Telehealth: Payer: Self-pay | Admitting: Cardiology

## 2022-02-22 MED ORDER — CARVEDILOL 3.125 MG PO TABS: ORAL_TABLET | ORAL | 0 refills | Status: DC

## 2022-02-22 NOTE — Telephone Encounter (Signed)
? ?*  STAT* If patient is at the pharmacy, call can be transferred to refill team. ? ? ?1. Which medications need to be refilled? (please list name of each medication and dose if known) carvedilol (COREG) 3.125 MG tablet ? ?2. Which pharmacy/location (including street and city if local pharmacy) is medication to be sent to? [facility] [phone] - [place], [place] - [place] ? ?3. Do they need a 30 day or 90 day supply? 90 days (270 tablets) ? ?Pt made an appt with Dr. Percival Spanish, she said she needs 90 days supply sent for this meds because its cheeper to her insurance. She said she needs refill today ?

## 2022-02-22 NOTE — Telephone Encounter (Signed)
Refill sent to pharmacy.   

## 2022-02-23 ENCOUNTER — Other Ambulatory Visit: Payer: Self-pay | Admitting: Family Medicine

## 2022-02-23 DIAGNOSIS — F32A Depression, unspecified: Secondary | ICD-10-CM

## 2022-02-26 ENCOUNTER — Other Ambulatory Visit: Payer: Self-pay | Admitting: Family Medicine

## 2022-02-26 DIAGNOSIS — F32A Depression, unspecified: Secondary | ICD-10-CM

## 2022-02-26 DIAGNOSIS — F419 Anxiety disorder, unspecified: Secondary | ICD-10-CM

## 2022-02-26 NOTE — Telephone Encounter (Signed)
Patient called checking on the status of refill. Please advise. ? ?Thanks! ?

## 2022-02-27 ENCOUNTER — Other Ambulatory Visit: Payer: Self-pay | Admitting: Family Medicine

## 2022-02-27 DIAGNOSIS — F32A Depression, unspecified: Secondary | ICD-10-CM

## 2022-03-01 ENCOUNTER — Ambulatory Visit: Payer: Medicaid Other | Admitting: Pharmacist

## 2022-03-15 ENCOUNTER — Encounter: Payer: Self-pay | Admitting: Pharmacist

## 2022-03-15 ENCOUNTER — Ambulatory Visit (INDEPENDENT_AMBULATORY_CARE_PROVIDER_SITE_OTHER): Payer: Medicaid Other | Admitting: Pharmacist

## 2022-03-15 DIAGNOSIS — E1165 Type 2 diabetes mellitus with hyperglycemia: Secondary | ICD-10-CM

## 2022-03-15 DIAGNOSIS — E1110 Type 2 diabetes mellitus with ketoacidosis without coma: Secondary | ICD-10-CM | POA: Diagnosis not present

## 2022-03-15 MED ORDER — INSULIN ASPART 100 UNIT/ML FLEXPEN: 12.0000 [IU] | PEN_INJECTOR | Freq: Three times a day (TID) | SUBCUTANEOUS | 11 refills | Status: DC

## 2022-03-15 MED ORDER — LANTUS SOLOSTAR 100 UNIT/ML ~~LOC~~ SOPN: 54.0000 [IU] | PEN_INJECTOR | Freq: Every morning | SUBCUTANEOUS | 11 refills | Status: DC

## 2022-03-15 NOTE — Progress Notes (Signed)
? ? ?S:    ? ?Chief Complaint  ?Patient presents with  ? Medication Management  ?  Diabetes  ? ?Deborah [name] is a 56 y.o. female who presents for diabetes evaluation, education, and management. PMH is significant for recent DKA and newly diagnosed diabetes. Patient was referred and last seen by Primary Care Provider, Dr. Arby Barrette, on 02/13/22.  She was admitted to the hospital and diagnosed with Diabetes in February 2023.  ? ?Today, patient arrives in good spirits and presents without assistance. Patient's son, [name], was present at this visit and also contributed to history. ? ?Patient does not have a previous diagnosis of diabetes. Patient reports symptoms of polyuria, polydipsia began in January. ? ?Family/Social History: mother had pre-diabetes ? ?Current diabetes medications include: Levemir (insulin detemir) 35 units BID, Novolog (insulin aspart) 10 units TID ? ?Patient reports taking all medications as prescribed. Patient reports adherence with medications. Patient reports missing her medications 0 times per week, on average. ? ?Patient reports this has been life-changing with having to do 5 injections per day with insulin.  ? ?Do you feel that your medications are working for you? Yes. Patient reports less fatigue, less stomach pain. ?Have you been experiencing any side effects to the medications prescribed? Yes - nausea at first but reports this has subsided.  ? ?Patient denies hypoglycemic events. Patient does report dizzy spells and off-balance. Patient does report her sugar dropped to 68 and drank 4 sips of regular coke. She did not take insulin during this. She denied any symptoms during this. ? ?Patient reports checking her blood sugar 4x/day before each meal ?Reported home fasting blood sugars: 110 this morning; 95 before lunch; 80-90 before lunch range; 84 before dinner yesterday; 80-110 before dinner range ? ?Patient reports nocturia (nighttime urination) 1-2 times per night.  ?Patient reports  neuropathy (nerve pain) in left hand.  ?Patient reports visual changes. She reports being unable to see when she went to the hospital but is closer to baseline now. She reports having an upcoming eye doctor appointment.  ?Patient reports self foot exams.  ? ?Patient reported dietary habits: Eats 3 meals/day ?Breakfast: fruits, bananas, apple, yogurt, grits, sausage patties ?Lunch: baked chicken wings, corn, salad (cucumber, lettuce) ?Dinner: grilled chicken, steak, salad, green beans ?Drinks: water, zero-calorie tea, no sugar sodas, Coke Zero ? ?Patient reports she has not eaten any rice, potatoes, no fried foods since hospitalization. Patient has made huge changes to diet since this. ? ?O:  ?Physical Exam ?Constitutional:   ?   Appearance: Normal appearance.  ?Pulmonary:  ?   Effort: Pulmonary effort is normal.  ?Neurological:  ?   Mental Status: She is alert.  ?Psychiatric:     ?   Mood and Affect: Mood normal.     ?   Behavior: Behavior normal.     ?   Thought Content: Thought content normal.     ?   Judgment: Judgment normal.  ? ? ?Review of Systems  ?All other systems reviewed and are negative. ? ? ?Lab Results  ?Component Value Date  ? HGBA1C >15.5 (H) 01/12/2022  ? ?Vitals:  ? 03/15/22 1538  ?BP: (!) 120/92  ?Pulse: 100  ?SpO2: 98%  ? ? ?Lipid Panel  ?   ?Component Value Date/Time  ? CHOL 165 06/14/2021 1437  ? TRIG 91 06/14/2021 1437  ? HDL 46 06/14/2021 1437  ? CHOLHDL 3.6 06/14/2021 1437  ? CHOLHDL 5.1 07/17/2013 0923  ? VLDL 30 07/17/2013 0923  ?  LDLCALC 102 (H) 06/14/2021 1437  ? LDLDIRECT 130 (H) 02/14/2012 1424  ? ? ?Clinical Atherosclerotic Cardiovascular Disease (ASCVD): No  ?The 10-year ASCVD risk score (Arnett DK, et al., 2019) is: 8.1% ?  Values used to calculate the score: ?    Age: 23 years ?    Sex: Female ?    Is Non-Hispanic African American: Yes ?    Diabetic: Yes ?    Tobacco smoker: No ?    Systolic Blood Pressure: 712 mmHg ?    Is BP treated: Yes ?    HDL Cholesterol: 46 mg/dL ?    Total  Cholesterol: 165 mg/dL  ? ? ?A/P: ?Diabetes newly diagnosed 12/2021 currently with improved control. Patient is able to verbalize appropriate hypoglycemia management plan. Medication adherence appears optimal. Control is suboptimal due to need for medication optimization. ?-Discontinued basal insulin Levemir (insulin detemir).  ?- Initiated basal insulin Lantus (insulin glargine) at 54 units every morning.  ?-Increased dose of rapid insulin Novolog (insulin aspart) at 12 units TID with meals.  ?-Patient educated on purpose, proper use, and potential adverse effects of insulin.  ?-Extensively discussed pathophysiology of diabetes, recommended lifestyle interventions, dietary effects on blood sugar control.  ?-Counseled on s/sx of and management of hypoglycemia.  ?-Next A1c anticipated in May.  ?- Will consider GLP-1 in future. ? ?Written patient instructions provided. Patient verbalized understanding of treatment plan. Total time in face to face counseling 58 minutes.   ? ?Follow up pharmacist in 4 weeks. Patient seen with Earvin Hansen, PharmD Candidate, and Rebbeca Paul, PharmD, PGY2 Pharmacy Resident.  ? ?

## 2022-03-15 NOTE — Patient Instructions (Addendum)
Nice to see you today! ? ?Today, we stopped your Levemir. We are starting Lantus (insulin glargine) at 54 units every morning. This shot is only once daily. ?We increased your Novolog (insulin aspart) dose to 12 units three times daily with meals.  ? ?Great work with [employer]. Keep it up! ? ?We will follow up with you in 4 weeks on May 18 at 3:15 pm.  ?

## 2022-03-16 DIAGNOSIS — E1165 Type 2 diabetes mellitus with hyperglycemia: Secondary | ICD-10-CM | POA: Insufficient documentation

## 2022-03-16 NOTE — Progress Notes (Signed)
Reviewed: I agree with Dr. Koval's documentation and management. 

## 2022-03-16 NOTE — Assessment & Plan Note (Signed)
Diabetes newly diagnosed currently with improved control. Patient is able to verbalize appropriate hypoglycemia management plan. Medication adherence appears optimal. Control is suboptimal due to need for medication optimization. ?-Discontinued basal insulin Levemir (insulin detemir).  ?- Initiated basal insulin Lantus (insulin glargine) at 54 units every morning.  ?-Increased dose of rapid insulin Novolog (insulin aspart) at 12 units TID with meals.  ?-Patient educated on purpose, proper use, and potential adverse effects of insulin.  ?-Extensively discussed pathophysiology of diabetes, recommended lifestyle interventions, dietary effects on blood sugar control.  ?-Counseled on s/sx of and management of hypoglycemia.  ?-Next A1c anticipated in May.  ?- Will consider GLP-1 in future. ?

## 2022-03-21 ENCOUNTER — Telehealth: Payer: Self-pay

## 2022-03-21 NOTE — Telephone Encounter (Signed)
Patient calls nurse line requesting to speak to pharmacy team.  ? ?Patient reports she has ~23 units left in lantus pen, however she is to inject 54 units. Patient would like to discuss just starting a new pen vs using the remainder and new pen for next dose.  ? ?Patient reports she feels at least 2 units "come out" when she clicks the pen.  ? ?Will forward to pharmacy team for assistance.  ? ?

## 2022-03-22 ENCOUNTER — Ambulatory Visit: Payer: Medicaid Other | Admitting: Physical Therapy

## 2022-03-22 NOTE — Telephone Encounter (Signed)
Patient phone call to clarify strategy for utilizing all insulin in her pens.  ? ?Patient was instructed to use all of the remaining portion of her pen and then give QS for the total daily dose.  She reports comfort with doing the math to determine correct total dose.  ? ?We also discussed carbohydrate content (grams) for bananas.   ? ?She thanked me for the return call.   ?No additional follow-up planned at this time.  ? ?

## 2022-03-28 NOTE — Progress Notes (Signed)
?  ?Cardiology Office Note ? ? ?Date:  03/29/2022  ? ?ID:  Deborah [name], DOB March 13, 1966, MRN [id number] ? ?PCP:  [name], DO  ?Cardiologist:   Minus Breeding, MD ? ? ?Chief Complaint  ?Patient presents with  ? Snoring  ? ? ?  ?History of Present Illness:  ?Deborah [name] is a 56 y.o. female who presents for follow up of a reduced EF.  She was seen here in 2000 and and did have a stress perfusion study. This demonstrated possibly a reduced ejection fraction of 47%. There was artifact. There was perhaps some hypoperfusion of the anterior septum. Cardiac catheterization was planned but this did not happen. I saw her in 2016 and I ordered dobutamine echocardiogram. This confirmed the ejection fraction was somewhat low at 35%. I titrated meds and in 2016 her EF was low normal (45 - 50%) and again in 2019.    ? ?Since I last saw her she was in the hospital in February.  This was really for diabetes and I did review these records.  She is been on insulin.  I do note that during that time she had some volume overload apparently.  However, an ejection fraction on her echo was actually improved to 55 to 60%.  There were no significant valvular abnormalities. ? ?From a cardiovascular standpoint she is feeling well.  She is lost some weight.  She is doing some walking for exercise.  She is not having any new chest pressure, neck or arm discomfort.  She is not having any new shortness of breath, PND or orthopnea.  She has no palpitations, presyncope or syncope.  She had no chest pressure. ? ? ?Past Medical History:  ?Diagnosis Date  ? Anemia   ? Anxiety   ? Arthritis   ? ,Knees, back  ? Blood in stool 08/28/2012  ? 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 Depression   ? Educated about COVID-19 virus infection 04/16/2019  ? Endometrial cancer ([place])   ? 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 GASTROESOPHAGEAL REFLUX, NO ESOPHAGITIS 01/23/2007  ? Qualifier: Diagnosis of  By: Herma Ard    ? HLD (hyperlipidemia)   ? borderline  ?  Hx of echocardiogram   ? Echo (9/15): EF 50-55%, normal wall motion, grade 2 diastolic dysfunction, mild LAE  ? Hx of radiation therapy 04/07/13- 05/14/13  ? pelvis 45 gray 25 fx  ? Hypertension   ? Obesity   ? Swelling   ? ANKLES - TAKES LASIX  ? Wrist pain 08/28/2012  ? ? ?Past Surgical History:  ?Procedure Laterality Date  ? ABDOMINAL HYSTERECTOMY  10/2011  ? complete  ? CHOLECYSTECTOMY N/A 07/08/2015  ? Procedure: LAPAROSCOPIC CHOLECYSTECTOMY WITH INTRAOPERATIVE CHOLANGIOGRAM;  Surgeon: Judeth Horn, MD;  Location: [place];  Service: General;  Laterality: N/A;  ? HIATAL HERNIA REPAIR    ? REPAIR VAGINAL CUFF  10/07/2012  ? Procedure: REPAIR VAGINAL CUFF;  Surgeon: [name], MD PHD;  Location: [facility];  Service: Gynecology;;  ? ROBOTIC ASSISTED TOTAL HYSTERECTOMY WITH BILATERAL SALPINGO OOPHERECTOMY  10/07/2012  ? Procedure: ROBOTIC ASSISTED TOTAL HYSTERECTOMY WITH BILATERAL SALPINGO OOPHORECTOMY;  Surgeon: [name], MD PHD;  Location: [facility];  Service: Gynecology;  Laterality: Bilateral;  ? ? ? ?Current Outpatient Medications  ?Medication Sig Dispense Refill  ? Accu-Chek Softclix Lancets lancets Use to check blood sugar 4x daily. E11.65 100 each 12  ? acetaminophen (TYLENOL) 500 MG tablet Take 1,000 mg by mouth every 6 (six)  hours as needed for moderate pain (pain).    ? blood glucose meter kit and supplies Dispense based on patient and insurance preference. Use up to four times daily as directed. (FOR ICD-10 E10.9, E11.9). 1 each 0  ? carvedilol (COREG) 3.125 MG tablet TAKE 1 TABLET BY MOUTH EVERY MORNING AND 2 TABLETS AT BEDTIME 270 tablet 0  ? diazepam (VALIUM) 5 MG tablet Take 1 tablet (5 mg total) by mouth daily as needed for anxiety. 30 tablet 0  ? diclofenac Sodium (VOLTAREN) 1 % GEL Apply 2 g topically 4 (four) times daily. (Patient taking differently: Apply 2 g topically daily as needed (for pain).) 100 g 0  ? glucose blood (ACCU-CHEK GUIDE) test strip Use to check blood sugar 4x daily. E11.65 100  each 12  ? insulin aspart (NOVOLOG) 100 UNIT/ML FlexPen Inject 12 Units into the skin 3 (three) times daily with meals. 15 mL 11  ? insulin glargine (LANTUS SOLOSTAR) 100 UNIT/ML Solostar Pen Inject 54 Units into the skin every morning. 15 mL 11  ? Insulin Pen Needle 32G X 4 MM MISC Use as directed. 360 each 3  ? meclizine (ANTIVERT) 25 MG tablet TAKE 1-2 TABLET(25 MG) BY MOUTH THREE TIMES DAILY AS NEEDED FOR DIZZINESS. 90 tablet 0  ? omeprazole (PRILOSEC) 20 MG capsule TAKE 1 CAPSULE(20 MG) BY MOUTH DAILY (Patient taking differently: Take 20 mg by mouth daily as needed.) 90 capsule 0  ? polyethylene glycol powder (GLYCOLAX/MIRALAX) 17 GM/SCOOP powder Take 17 g by mouth daily as needed. (Patient taking differently: Take 17 g by mouth daily as needed for mild constipation.) 3350 g 1  ? ?No current facility-administered medications for this visit.  ? ? ?Allergies:   Lactose intolerance (gi)  ? ?ROS:  Please see the history of present illness.   Otherwise, review of systems are positive for none.   All other systems are reviewed and negative.  ? ? ?PHYSICAL EXAM: ?VS:  BP 124/78   Pulse 95   Ht _0  (1.676 m)   Wt 292 lb (132.5 kg)   LMP 09/20/2012   SpO2 98%   BMI 47.13 kg/m?  , BMI Body mass index is 47.13 kg/m?. ?GENERAL:  Well appearing ?NECK:  No jugular venous distention, waveform within normal limits, carotid upstroke brisk and symmetric, no bruits, no thyromegaly ?LUNGS:  Clear to auscultation bilaterally ?CHEST:  Unremarkable ?HEART:  PMI not displaced or sustained,S1 and S2 within normal limits, no S3, no S4, no clicks, no rubs, no murmurs ?ABD:  Flat, positive bowel sounds normal in frequency in pitch, no bruits, no rebound, no guarding, no midline pulsatile mass, no hepatomegaly, no splenomegaly ?EXT:  2 plus pulses throughout, no edema, no cyanosis no clubbing ? ? ?EKG:  EKG is  ordered today. ?The ekg ordered today demonstrates NSR rate 95, axis within normal limits, intervals within normal limits,  no acute ST-T wave changes. ? ? ?Recent Labs: ?01/12/2022: ALT 19; TSH 0.094 ?01/16/2022: BUN 22; Creatinine, Ser 1.20; Hemoglobin 12.5; Magnesium 1.4; Platelets 147; Potassium 2.8; Sodium 136  ? ? ?Lipid Panel ?   ?Component Value Date/Time  ? CHOL 165 06/14/2021 1437  ? TRIG 91 06/14/2021 1437  ? HDL 46 06/14/2021 1437  ? CHOLHDL 3.6 06/14/2021 1437  ? CHOLHDL 5.1 07/17/2013 0923  ? VLDL 30 07/17/2013 0923  ? LDLCALC 102 (H) 06/14/2021 1437  ? LDLDIRECT 130 (H) 02/14/2012 1424  ? ?  ? ?Wt Readings from Last 3 Encounters:  ?03/29/22 292 lb (  132.5 kg)  ?03/15/22 290 lb 12.8 oz (131.9 kg)  ?02/13/22 293 lb (132.9 kg)  ?  ? ? ?Other studies Reviewed: ?Additional studies/ records that were reviewed today include: Hospital records. ?Review of the above records demonstrates:  Please see elsewhere in the note.   ? ? ?ASSESSMENT AND PLAN: ? ?Chronic systolic and diastolic HF:    Actually her ejection fraction is now improved.  She will continue the meds as listed.  No change. ? ?Hypertension:   Her blood pressure is controlled in the context of managing her heart failure.  Continue the meds as listed.  ? ?Sleep apnea: She had previous sleep apnea testing which was positive in 2019.  However, she never received CPAP.  I will request another sleep study so she can get treatment of this.  STOP BANG is at least 4 ? ? ?Current medicines are reviewed at length with the patient today.  The patient does not have concerns regarding medicines. ? ?The following changes have been made: None ? ?Labs/ tests ordered today include: None ? ?Orders Placed This Encounter  ?Procedures  ? Basic metabolic panel  ? EKG 12-Lead  ? Split night study  ? ? ? ?Disposition:   FU with me in 1 year.   ? ? ?Signed, ?Minus Breeding, MD  ?03/29/2022 4:48 PM    ?[facility] ? ? ? ?

## 2022-03-29 ENCOUNTER — Other Ambulatory Visit: Payer: Self-pay

## 2022-03-29 ENCOUNTER — Encounter: Payer: Self-pay | Admitting: Cardiology

## 2022-03-29 ENCOUNTER — Ambulatory Visit (INDEPENDENT_AMBULATORY_CARE_PROVIDER_SITE_OTHER): Payer: Medicaid Other | Admitting: Cardiology

## 2022-03-29 VITALS — BP 124/78 | HR 95 | Ht 66.0 in | Wt 292.0 lb

## 2022-03-29 DIAGNOSIS — I5042 Chronic combined systolic (congestive) and diastolic (congestive) heart failure: Secondary | ICD-10-CM | POA: Diagnosis not present

## 2022-03-29 DIAGNOSIS — G4730 Sleep apnea, unspecified: Secondary | ICD-10-CM | POA: Diagnosis not present

## 2022-03-29 DIAGNOSIS — I10 Essential (primary) hypertension: Secondary | ICD-10-CM

## 2022-03-29 MED ORDER — CARVEDILOL 3.125 MG PO TABS: ORAL_TABLET | ORAL | 3 refills | Status: DC

## 2022-03-29 NOTE — Patient Instructions (Signed)
Medication Instructions:  ?Continue same medications ?*If you need a refill on your cardiac medications before your next appointment, please call your pharmacy* ? ? ?Lab Work: ?None ordered ? ? ?Testing/Procedures: ?Sleep Study  will be called back with appointment  ? ? ?Follow-Up: ?At [facility], you and your health needs are our priority.  As part of our continuing mission to provide you with exceptional heart care, we have created designated Provider Care Teams.  These Care Teams include your primary Cardiologist (physician) and Advanced Practice Providers (APPs -  Physician Assistants and Nurse Practitioners) who all work together to provide you with the care you need, when you need it. ? ?We recommend signing up for the patient portal called "MyChart".  Sign up information is provided on this After Visit Summary.  MyChart is used to connect with patients for Virtual Visits (Telemedicine).  Patients are able to view lab/test results, encounter notes, upcoming appointments, etc.  Non-urgent messages can be sent to your provider as well.   ?To learn more about what you can do with MyChart, go to [email or web address].   ? ?Your next appointment:  1 year ?  ? ?The format for your next appointment: Office ? ? ?Provider:  Dr.Hochrein ? ? ?Important Information About Sugar ? ? ? ? ? ? ?

## 2022-03-30 ENCOUNTER — Telehealth: Payer: Self-pay | Admitting: Pharmacist

## 2022-03-30 ENCOUNTER — Telehealth: Payer: Self-pay | Admitting: *Deleted

## 2022-03-30 ENCOUNTER — Other Ambulatory Visit: Payer: Self-pay | Admitting: Cardiology

## 2022-03-30 DIAGNOSIS — E1110 Type 2 diabetes mellitus with ketoacidosis without coma: Secondary | ICD-10-CM

## 2022-03-30 DIAGNOSIS — I10 Essential (primary) hypertension: Secondary | ICD-10-CM

## 2022-03-30 DIAGNOSIS — G4733 Obstructive sleep apnea (adult) (pediatric): Secondary | ICD-10-CM

## 2022-03-30 DIAGNOSIS — E1165 Type 2 diabetes mellitus with hyperglycemia: Secondary | ICD-10-CM

## 2022-03-30 MED ORDER — LANTUS SOLOSTAR 100 UNIT/ML ~~LOC~~ SOPN: 44.0000 [IU] | PEN_INJECTOR | Freq: Every morning | SUBCUTANEOUS | Status: DC

## 2022-03-30 MED ORDER — INSULIN ASPART 100 UNIT/ML FLEXPEN: 10.0000 [IU] | PEN_INJECTOR | Freq: Three times a day (TID) | SUBCUTANEOUS | Status: DC

## 2022-03-30 NOTE — Telephone Encounter (Signed)
Patient called and shared that her blood sugar control has been going well but also that she had a low reading of 69 this morning.  ? ?She reports adherence with Lantus 54 units once daily and Novolog 12 units prior to each meal at 9AM, 12:00 Noon and 5PM ? ?She reported home blood glucose readings of 77, 79, 90, 110, and 111 recently.  ?She reports her highest blood sugar of 167 over the last 2 weeks.  ? ?Following discussion and review of her appropriate hypoglycemia management, we discussed insulin regimen decreases to both basal and bolus insulins.  ? ?New regimen: ?She will recheck her blood sugar after we hang-up and if blood sugar is > 90  ? ?Start  Lantus (insulin glargine) 44 units in place of 54 units daily.  ?Told to skip AM Novolog today ?Start Novolog (insulin aspart) 10 units in place of 12 units prior to each of her three meals daily.  ? ?She had follow-up planned with me on 5/18.  ?She thanked me for the assistance.  ? ?

## 2022-03-30 NOTE — Assessment & Plan Note (Signed)
Start  Lantus (insulin glargine) 44 units in place of 54 units daily.  ?Told to skip AM Novolog today ?Start Novolog (insulin aspart) 10 units in place of 12 units prior to each of her three meals daily.  ?

## 2022-03-30 NOTE — Telephone Encounter (Signed)
Prior Authorization for HST sent to [facility] via web portal. Per web portal no PA is required.[name]  ?

## 2022-04-02 NOTE — Telephone Encounter (Signed)
Noted and agree. 

## 2022-04-10 ENCOUNTER — Telehealth: Payer: Self-pay | Admitting: *Deleted

## 2022-04-10 NOTE — Telephone Encounter (Signed)
Patient called and scheduled a follow up appt with Dr Delsa Sale in August  ?

## 2022-04-12 ENCOUNTER — Ambulatory Visit (INDEPENDENT_AMBULATORY_CARE_PROVIDER_SITE_OTHER): Payer: Medicaid Other | Admitting: Pharmacist

## 2022-04-12 ENCOUNTER — Encounter: Payer: Self-pay | Admitting: Pharmacist

## 2022-04-12 DIAGNOSIS — E1110 Type 2 diabetes mellitus with ketoacidosis without coma: Secondary | ICD-10-CM

## 2022-04-12 DIAGNOSIS — E1165 Type 2 diabetes mellitus with hyperglycemia: Secondary | ICD-10-CM

## 2022-04-12 MED ORDER — LANTUS SOLOSTAR 100 UNIT/ML ~~LOC~~ SOPN: 38.0000 [IU] | PEN_INJECTOR | Freq: Every morning | SUBCUTANEOUS | Status: DC

## 2022-04-12 NOTE — Patient Instructions (Addendum)
It was nice to see you today!  Your goal blood sugar is 80-130 before eating and less than 180 after eating.  Medication Changes: Decrease Lantus to 38 units daily  Continue Novolog 10 units three times daily before meals  Monitor blood sugars at home and keep a log (glucometer or piece of paper) to bring with you to your next visit.  Keep up the good work with diet and exercise. Aim for a diet full of vegetables, fruit and lean meats (chicken, [place], fish). Try to limit salt intake by eating fresh or frozen vegetables (instead of canned), rinse canned vegetables prior to cooking and do not add any additional salt to meals.   Call us if you have any BG less than 70 before next visit.   Call to make appointment with Dr. Arby Barrette for June.

## 2022-04-12 NOTE — Progress Notes (Signed)
S:     Chief Complaint  Patient presents with   Medication Management    DM f/u   Deborah [name] is a 56 y.o. female who presents for diabetes evaluation, education, and management. PMH is significant for recent DKA and newly diagnosed diabetes. Patient was referred and last seen by Primary Care Provider, Dr. Arby Barrette, on 02/13/22.  She was admitted to the hospital and diagnosed with Diabetes in February 2023. Last seen by pharmacy clinic 03/15/22. We switched Levemir to once daily Lantus and increased Novolog. Patient called 03/30/22 reporting one low BG reading of 69 and we decreased Lantus and Novolog over the phone.   Today, patient arrives in good spirits and presents without assistance accompanied by her son, Deborah [name]. Patient reports continuing to eat healthy and reports continued improved BG. She has written down many questions about diabetes to ask today which were answered. She has adequate supply of her insulins at home.   Current diabetes medications include: Lantus (insulin glargine) 44 units daily, Novolog (insulin aspart) 10 units TID.  Current hypertension medications include: carvedilol 3.125 mg BID Current hyperlipidemia medications include: none  Patient reports taking all medications as prescribed. Patient reports adherence with medications. Patient reports missing her medications 0 times per week, on average.  Do you feel that your medications are working for you? yes Have you been experiencing any side effects to the medications prescribed? no Do you have any problems obtaining medications due to transportation or finances? no Insurance coverage: Medicaid  Patient reports two hypoglycemic events with BG of 69.  Reported home fasting blood sugars: checks three times daily before meals; range 69 to 167 with majority of readings 80s to low 100s   Patient reported dietary habits: Eats 3 meals/day. Patient reports she has not eaten any rice, potatoes, no fried foods since  hospitalization. Patient has made huge changes to diet since this.  O:  Physical Exam Vitals reviewed.  Cardiovascular:     Rate and Rhythm: Normal rate.  Pulmonary:     Effort: Pulmonary effort is normal.  Neurological:     Mental Status: She is alert.  Psychiatric:        Mood and Affect: Mood normal.        Behavior: Behavior normal.        Thought Content: Thought content normal.   Review of Systems  All other systems reviewed and are negative.  Lab Results  Component Value Date   HGBA1C >15.5 (H) 01/12/2022   Vitals:   04/12/22 1524  BP: 124/74  Pulse: 97  SpO2: 99%    Lipid Panel     Component Value Date/Time   CHOL 165 06/14/2021 1437   TRIG 91 06/14/2021 1437   HDL 46 06/14/2021 1437   CHOLHDL 3.6 06/14/2021 1437   CHOLHDL 5.1 07/17/2013 0923   VLDL 30 07/17/2013 0923   LDLCALC 102 (H) 06/14/2021 1437   LDLDIRECT 130 (H) 02/14/2012 1424   Clinical Atherosclerotic Cardiovascular Disease (ASCVD): No  The 10-year ASCVD risk score (Arnett DK, et al., 2019) is: 9.7%   Values used to calculate the score:     Age: 52 years     Sex: Female     Is Non-Hispanic African American: Yes     Diabetic: Yes     Tobacco smoker: No     Systolic Blood Pressure: 742 mmHg     Is BP treated: Yes     HDL Cholesterol: 46 mg/dL  Total Cholesterol: 165 mg/dL   A/P: Diabetes longstanding currently with home BG at goal. Patient is able to verbalize appropriate hypoglycemia management plan. Medication adherence appears optimal. Will adjust insulin regimen to decrease frequency of BG of 60s to 70s and target BG 80s-130s.  -Decrease Lantus (insulin glargine) from 44 to 38 units daily -Continue Novolog (insulin aspart) 10 units TIDAC -Extensively discussed pathophysiology of diabetes, recommended lifestyle interventions, dietary effects on blood sugar control.  -Counseled on s/sx of and management of hypoglycemia.  -Next A1c anticipated at next visit.  -Consider addition of  GLP-1 in the future to allow for decreased insulin.  -Patient would likely qualify for a CGM given multiple daily shots of insulin. However, suspect seeing BG at any time of day and rising after eating (as it should) would add stress to the patient.   Hyperlipidemia: Last lipid panel 05/2021 with LDL 102. Goal <70 mg/dL per AACE guidelines. She is not currently on a statin which is recommended in patients 48-60 years old with diabetes.  Not addressed today.  -Consider addition of statin at future visit.   Written patient instructions provided. Patient verbalized understanding of treatment plan. Total time in face to face counseling 46 minutes.    Follow up PCP clinic visit in 1 month. Patient seen with Rebbeca Paul, PharmD, PGY2 Pharmacy Resident.

## 2022-04-13 ENCOUNTER — Encounter (HOSPITAL_BASED_OUTPATIENT_CLINIC_OR_DEPARTMENT_OTHER): Payer: Medicaid Other | Admitting: Cardiovascular Disease

## 2022-04-13 NOTE — Assessment & Plan Note (Signed)
Diabetes longstanding currently with home BG at goal. Patient is able to verbalize appropriate hypoglycemia management plan. Medication adherence appears optimal. Will adjust insulin regimen to decrease frequency of BG of 60s to 70s and target BG 80s-130s.  -Decrease Lantus (insulin glargine) from 44 to 38 units daily -Continue Novolog (insulin aspart) 10 units TIDAC -Extensively discussed pathophysiology of diabetes, recommended lifestyle interventions, dietary effects on blood sugar control.  -Counseled on s/sx of and management of hypoglycemia.  -Next A1c anticipated at next visit.  -Consider addition of GLP-1 in the future to allow for decreased insulin.  -Patient would likely qualify for a CGM given multiple daily shots of insulin. However, suspect seeing BG at any time of day and rising after eating (as it should) would add stress to the patient.

## 2022-04-16 NOTE — Progress Notes (Signed)
Reviewed: I agree with Dr. Koval's documentation and management. 

## 2022-04-17 ENCOUNTER — Telehealth: Payer: Self-pay

## 2022-04-17 MED ORDER — ACCU-CHEK GUIDE ME W/DEVICE KIT: PACK | 0 refills | Status: DC

## 2022-04-17 NOTE — Telephone Encounter (Signed)
Patient calls nurse line requesting a new blood glucose meter.  Patient reports the one she has is no longer working.   New meter sent to preferred pharmacy.

## 2022-04-19 NOTE — Telephone Encounter (Signed)
Patient reports her insurance will not cover glucose meter.   Pharmacy reports new meter cash price is 10 dollars.   Attempted to call patient, however received VM.

## 2022-04-20 NOTE — Telephone Encounter (Signed)
Patient has been updated.

## 2022-04-24 ENCOUNTER — Other Ambulatory Visit: Payer: Self-pay | Admitting: Family Medicine

## 2022-04-24 DIAGNOSIS — F32A Depression, unspecified: Secondary | ICD-10-CM

## 2022-05-01 ENCOUNTER — Encounter: Payer: Self-pay | Admitting: *Deleted

## 2022-05-02 ENCOUNTER — Ambulatory Visit (INDEPENDENT_AMBULATORY_CARE_PROVIDER_SITE_OTHER): Payer: Medicaid Other | Admitting: Family Medicine

## 2022-05-02 ENCOUNTER — Encounter: Payer: Self-pay | Admitting: Family Medicine

## 2022-05-02 ENCOUNTER — Ambulatory Visit: Payer: Medicaid Other | Admitting: Family Medicine

## 2022-05-02 VITALS — BP 133/78 | HR 96 | Wt 295.0 lb

## 2022-05-02 DIAGNOSIS — H02823 Cysts of right eye, unspecified eyelid: Secondary | ICD-10-CM

## 2022-05-02 DIAGNOSIS — L989 Disorder of the skin and subcutaneous tissue, unspecified: Secondary | ICD-10-CM

## 2022-05-02 DIAGNOSIS — E1165 Type 2 diabetes mellitus with hyperglycemia: Secondary | ICD-10-CM | POA: Diagnosis not present

## 2022-05-02 LAB — POCT GLYCOSYLATED HEMOGLOBIN (HGB A1C): HbA1c, POC (controlled diabetic range): 5.6 % (ref 0.0–7.0)

## 2022-05-02 NOTE — Progress Notes (Signed)
    SUBJECTIVE:   CHIEF COMPLAINT / HPI:   Deborah [name] is a 56 yo who presents for diabetes follow up.  Last A1c >15 3 months ago. Since then she has been following with pharmacy team for her insulin and diabetes management. Has also been implementing dietary changes. Currently on Lantus 38 units daily, and Novolog 10 units TID with meals . She states she wrote her sugars on paper but did not bring it to the visit. Will bring the numbers at follow up. Has been following a strict diet with the help of her son, and has been exercising. Does not drink sugary beverages, drinks lots of water.    She also notes a mass on R arm that she noticed about 2-3 months ago and now is larger and more form. Feels a slight pain sensation occasionally from the lesion.  Denies any pain today. Denies itching. Also states she noticed growth on R eye that started off looking like a mole about 3 months ago but has been getting larger. Does not bother her or cause pain. Denies changes in vision  PERTINENT  PMH / PSH: Reviewed   OBJECTIVE:   BP 133/78   Pulse 96   Wt 295 lb (133.8 kg)   LMP 09/20/2012   SpO2 98%   BMI 47.61 kg/m    Physical exam General: well appearing, NAD Cardiovascular: RRR, no murmurs Lungs: CTAB. Normal WOB Abdomen: soft, non-distended, non-tender Skin: warm, dry. R arm with approx 3cm hyperpigmented round lesion. R corner of eye with fluctuant non tender cyst       ASSESSMENT/PLAN:   Type 2 diabetes mellitus with hyperglycemia (HCC) A1c today 5.6 down from >15 three months prior. Congratulated patient on all of her hard work over the last few months. Discussed results with Dr. Valentina Lucks who has been working with her as well and he recommended decreasing Lantus by 10-20%.  She was currently on Lantus 38 units daily, and Novolog 10 units TID with meals. Decreased Lantus to 30 units daily. Discussed addition of statin which she would like to wait for now. Recommended seeing Dr. Valentina Lucks in  the next month and advised her to bring in her blood sugar log at that time. I suspect she may be able to go down further on her Insulin. I will follow up with her in 3 months for next a1c check and diabetes follow up.   Eyelid cyst, right Approx 1 cm non tender cyst that started off looking like a mole but has been getting larger. Discussed seeing her eye doctor for removal given its location. She already had an appointment scheduled with eye doctor and will bring it up to him at that time. Referring to Derm for a different lesion and if not addressed by optho can have it evaluated at that time as well.   Skin lesion of right arm Approx 3 cm hyperpigmented lesion on R arm that is non tender. Denies seeing lesions like this elsewhere on body. Given its rapid growth recommended biopsy. Potentially seborrheic keratosis given its "stuck on" appearance. Can not exclude melanoma given its hyperpigmentation and change in size though borders and symmetry are fairly regular. Attempted to schedule patient in derm clinic but she wanted to call back and schedule it on her own once she looks at her calendar. Will also place derm referral in case further evaluation is needed.      [place]

## 2022-05-02 NOTE — Patient Instructions (Addendum)
It was great seeing you today!  You were seen for diabetes follow up and great job with getting your sugars down so much! We are cutting down your on long acting insulin from 38 units to 30 units.   Please call the office to schedule an appointment to be seen at our dermatology clinic so they can take a small biopsy the lesion on your arm.  I am also putting referral in to dermatology in case further evaluation is needed.   For the lesion on her eye he will need to see the eye doctor for removal and since you already have a referral in the system you can call the eye doctor to get that scheduled.  I would like for you to see Dr. Valentina Lucks in the next 1 to 2 months for diabetes follow-up, and I would like to see you in 3 months, but if you need to be seen earlier than that for any new issues we're happy to fit you in, just give [place] a call!  Feel free to call with any questions or concerns at any time, at [phone].   Take care,  Dr. [name] [facility]

## 2022-05-07 DIAGNOSIS — H02823 Cysts of right eye, unspecified eyelid: Secondary | ICD-10-CM | POA: Insufficient documentation

## 2022-05-07 DIAGNOSIS — L989 Disorder of the skin and subcutaneous tissue, unspecified: Secondary | ICD-10-CM | POA: Insufficient documentation

## 2022-05-07 NOTE — Assessment & Plan Note (Addendum)
Approx 1 cm non tender cyst that started off looking like a mole but has been getting larger. Discussed seeing her eye doctor for removal given its location. She already had an appointment scheduled with eye doctor and will bring it up to him at that time. Referring to Derm for a different lesion and if not addressed by optho can have it evaluated at that time as well.

## 2022-05-07 NOTE — Assessment & Plan Note (Addendum)
Approx 3 cm hyperpigmented lesion on R arm that is non tender. Denies seeing lesions like this elsewhere on body. Given its rapid growth recommended biopsy. Potentially seborrheic keratosis given its "stuck on" appearance. Can not exclude melanoma given its hyperpigmentation and change in size though borders and symmetry are fairly regular. Attempted to schedule patient in derm clinic but she wanted to call back and schedule it on her own once she looks at her calendar. Will also place derm referral in case further evaluation is needed.

## 2022-05-07 NOTE — Assessment & Plan Note (Signed)
A1c today 5.6 down from >15 three months prior. Congratulated patient on all of her hard work over the last few months. Discussed results with Dr. Valentina Lucks who has been working with her as well and he recommended decreasing Lantus by 10-20%.  She was currently on Lantus 38 units daily, and Novolog 10 units TID with meals. Decreased Lantus to 30 units daily. Discussed addition of statin which she would like to wait for now. Recommended seeing Dr. Valentina Lucks in the next month and advised her to bring in her blood sugar log at that time. I suspect she may be able to go down further on her Insulin. I will follow up with her in 3 months for next a1c check and diabetes follow up.

## 2022-05-21 ENCOUNTER — Other Ambulatory Visit: Payer: Self-pay | Admitting: Radiology

## 2022-05-21 ENCOUNTER — Other Ambulatory Visit: Payer: Self-pay | Admitting: Family Medicine

## 2022-05-21 DIAGNOSIS — Z1231 Encounter for screening mammogram for malignant neoplasm of breast: Secondary | ICD-10-CM

## 2022-05-28 ENCOUNTER — Telehealth: Payer: Self-pay | Admitting: Family Medicine

## 2022-05-28 NOTE — Telephone Encounter (Signed)
..   Medicaid Managed Care   Unsuccessful Outreach Note  05/28/2022 Name: Deborah [name] MRN: [id number] DOB: 1966-08-11  Referred by: [name], DO Reason for referral : High Risk Managed Medicaid (I called the patient today to get her scheduled with the MM Team. I left my name and number on her VM.)   An unsuccessful telephone outreach was attempted today. The patient was referred to the case management team for assistance with care management and care coordination.   Follow Up Plan: The care management team will reach out to the patient again over the next 7-14 days.    [place]

## 2022-06-07 ENCOUNTER — Ambulatory Visit
Admission: RE | Admit: 2022-06-07 | Discharge: 2022-06-07 | Disposition: A | Discharge status: Home / Self Care | Payer: Medicaid Other | Source: Ambulatory Visit

## 2022-06-07 DIAGNOSIS — Z1231 Encounter for screening mammogram for malignant neoplasm of breast: Secondary | ICD-10-CM

## 2022-06-13 ENCOUNTER — Ambulatory Visit (HOSPITAL_BASED_OUTPATIENT_CLINIC_OR_DEPARTMENT_OTHER): Payer: Medicaid Other | Attending: Cardiology | Admitting: Cardiovascular Disease

## 2022-06-13 ENCOUNTER — Ambulatory Visit: Payer: Medicaid Other | Admitting: Obstetrics & Gynecology

## 2022-06-14 ENCOUNTER — Other Ambulatory Visit: Payer: Self-pay | Admitting: Family Medicine

## 2022-06-14 DIAGNOSIS — F32A Depression, unspecified: Secondary | ICD-10-CM

## 2022-06-19 ENCOUNTER — Other Ambulatory Visit: Payer: Self-pay | Admitting: Family Medicine

## 2022-06-25 ENCOUNTER — Telehealth: Payer: Self-pay | Admitting: Family Medicine

## 2022-06-25 NOTE — Telephone Encounter (Signed)
..   Medicaid Managed Care   Unsuccessful Outreach Note  06/25/2022 Name: Deborah [name] MRN: [id number] DOB: 1966-10-30  Referred by: [name], DO Reason for referral : High Risk Managed Medicaid (I called the patient today to get her scheduled with the MM Team. I left my name and number on her VM.)   A second unsuccessful telephone outreach was attempted today. The patient was referred to the case management team for assistance with care management and care coordination.   Follow Up Plan: The care management team will reach out to the patient again over the next 14 days.   [place]

## 2022-07-03 ENCOUNTER — Telehealth: Payer: Self-pay

## 2022-07-03 NOTE — Telephone Encounter (Signed)
Pt called to cancel her appointment with Dr.Jackson-Moore for tomorrow 8/9. Stating she doesn't feel well. She will call back at a later time.

## 2022-07-04 ENCOUNTER — Inpatient Hospital Stay: Payer: Medicaid Other | Admitting: Obstetrics & Gynecology

## 2022-07-11 ENCOUNTER — Telehealth: Payer: Self-pay | Admitting: Family Medicine

## 2022-07-11 NOTE — Telephone Encounter (Signed)
..   Medicaid Managed Care   Unsuccessful Outreach Note  07/11/2022 Name: Deborah [name] MRN: [id number] DOB: 12-02-1965  Referred by: [name], DO Reason for referral : High Risk Managed Medicaid (I called the patient today to get her scheduled with the MM Team. I left my name and number on her VM. This was the third attempt to reach her.)   Third unsuccessful telephone outreach was attempted today. The patient was referred to the case management team for assistance with care management and care coordination. The patient's primary care provider has been notified of our unsuccessful attempts to make or maintain contact with the patient. The care management team is pleased to engage with this patient at any time in the future should he/she be interested in assistance from the care management team.   Follow Up Plan: We have been unable to make contact with the patient for follow up. The care management team is available to follow up with the patient after provider conversation with the patient regarding recommendation for care management engagement and subsequent re-referral to the care management team.    [place], [place]

## 2022-07-19 ENCOUNTER — Ambulatory Visit: Payer: Medicaid Other | Admitting: Pharmacist

## 2022-07-24 ENCOUNTER — Other Ambulatory Visit: Payer: Self-pay | Admitting: Family Medicine

## 2022-07-24 DIAGNOSIS — F419 Anxiety disorder, unspecified: Secondary | ICD-10-CM

## 2022-07-26 ENCOUNTER — Ambulatory Visit: Payer: Medicaid Other | Admitting: Pharmacist

## 2022-08-09 ENCOUNTER — Ambulatory Visit (INDEPENDENT_AMBULATORY_CARE_PROVIDER_SITE_OTHER): Payer: Medicaid Other | Admitting: Pharmacist

## 2022-08-09 ENCOUNTER — Encounter: Payer: Self-pay | Admitting: Pharmacist

## 2022-08-09 VITALS — BP 109/75 | HR 99 | Wt 283.0 lb

## 2022-08-09 DIAGNOSIS — E1110 Type 2 diabetes mellitus with ketoacidosis without coma: Secondary | ICD-10-CM | POA: Diagnosis not present

## 2022-08-09 DIAGNOSIS — E1165 Type 2 diabetes mellitus with hyperglycemia: Secondary | ICD-10-CM | POA: Diagnosis not present

## 2022-08-09 MED ORDER — OZEMPIC (0.25 OR 0.5 MG/DOSE) 2 MG/3ML ~~LOC~~ SOPN: 0.2500 mg | PEN_INJECTOR | SUBCUTANEOUS | 2 refills | Status: DC

## 2022-08-09 MED ORDER — INSULIN ASPART 100 UNIT/ML FLEXPEN: 8.0000 [IU] | PEN_INJECTOR | Freq: Three times a day (TID) | SUBCUTANEOUS | Status: DC

## 2022-08-09 MED ORDER — LANTUS SOLOSTAR 100 UNIT/ML ~~LOC~~ SOPN: 24.0000 [IU] | PEN_INJECTOR | Freq: Every morning | SUBCUTANEOUS | Status: DC

## 2022-08-09 NOTE — Progress Notes (Signed)
Reviewed: I agree with Dr. Koval's documentation and management. 

## 2022-08-09 NOTE — Assessment & Plan Note (Signed)
Diabetes longstanding currently well-controlled. Patient is able to verbalize appropriate hypoglycemia management plan. Medication adherence appears excellent. Patient adherence and control are good, starting GLP-1 may add benefit of weight loss and reduce need for insulin, patient states goal of being off of insulin altogether. -Decreased dose of basal insulin Lantus (insulin glargine) from 30 units daily to 24 units daily. -Decreased dose of rapid insulin Novolog (insulin aspart) from 10 units 3 times daily with meals to 8 units 3 times daily with meals.  - Begin GLP-1 Ozempic (semaglutide) 0.25 mg weekly.  -Patient educated on purpose, proper use, and potential adverse effects of starting Ozempic, nausea, other GI symptoms.  -Extensively discussed pathophysiology of diabetes, recommended lifestyle interventions, dietary effects on blood sugar control.  -Counseled on s/sx of and management of hypoglycemia.

## 2022-08-09 NOTE — Patient Instructions (Addendum)
It was nice to see you today!  Your goal blood sugar is 80-130 before eating and less than 180 after eating.  Medication Changes: -Decrease dose of basal insulin Lantus (insulin glargine) from 30 units daily to 24 units daily. -Decrease dose of rapid insulin Novolog (insulin aspart) from 10 units 3 times daily with meals to 8 units 3 times daily with meals.  - Begin Ozempic (semaglutide) 0.25 mg weekly.   Monitor blood sugars at home and keep a log (glucometer or piece of paper) to bring with you to your next visit.  Keep up the good work with diet and exercise. Aim for a diet full of vegetables, fruit and lean meats (chicken, [place], fish). Try to limit salt intake by eating fresh or frozen vegetables (instead of canned), rinse canned vegetables prior to cooking and do not add any additional salt to meals.

## 2022-08-09 NOTE — Progress Notes (Signed)
S:     Chief Complaint  Patient presents with   Medication Management    Diabetes management   Deborah [name] is a 56 y.o. female who presents for diabetes evaluation, education, and management.  PMH is significant for DKA, AKI, CHF.  Patient was referred and last seen by Primary Care Provider, Dr. Arby Barrette, on 05/02/2022.   Today, patient arrives in good spirits and presents without any assistance.   Current diabetes medications include: Novolog (insulin aspart) 10 units 3 times daily with meals, Lantus (insulin glargine) 30 units every morning Current hypertension medications include: carvedilol 3.125 mg TID  Patient reports adherence to taking all medications as prescribed.   Do you feel that your medications are working for you? yes Have you been experiencing any side effects to the medications prescribed? Yes, gets sleepy sometimes Insurance coverage: [place] [employer]  Patient reports hypoglycemic events. Once or twice in the past couple months  Reported home fasting blood sugars: 80s to low 100s Reported 2 hour post-meal/random blood sugars: low 100s.  Patient reports nocturia (nighttime urination). About once occassionally, not every night. Patient reports neuropathy (nerve pain). Patient reports visual changes. Patient reports self foot exams.   Patient reported dietary habits: Eats 3 meals/day Breakfast: [place] sausage patties, banana, grits, apple Lunch: baked meat, mainly chicken, corn, salad, fruit, green beans Dinner: Same as lunch, preps her meals Snacks: banana, apple, oranges, yogurt, snacks at night Drinks: water, 0 calorie tea  Patient-reported exercise habits: walking around the house a couple times per week.  O:   Review of Systems  Gastrointestinal:  Positive for constipation (ocassionally). Negative for abdominal pain and diarrhea.  All other systems reviewed and are negative.   Physical Exam Vitals reviewed.  Constitutional:      Appearance:  Normal appearance.  Pulmonary:     Effort: Pulmonary effort is normal.  Neurological:     Mental Status: She is alert.  Psychiatric:        Mood and Affect: Mood normal.        Behavior: Behavior normal.        Thought Content: Thought content normal.      Lab Results  Component Value Date   HGBA1C 5.6 05/02/2022   Vitals:   08/09/22 1549  BP: 109/75  Pulse: 99  SpO2: 98%    Lipid Panel     Component Value Date/Time   CHOL 165 06/14/2021 1437   TRIG 91 06/14/2021 1437   HDL 46 06/14/2021 1437   CHOLHDL 3.6 06/14/2021 1437   CHOLHDL 5.1 07/17/2013 0923   VLDL 30 07/17/2013 0923   LDLCALC 102 (H) 06/14/2021 1437   LDLDIRECT 130 (H) 02/14/2012 1424    A/P: Diabetes longstanding currently well-controlled. Patient is able to verbalize appropriate hypoglycemia management plan. Medication adherence appears excellent. Patient adherence and control are good, starting GLP-1 may add benefit of weight loss and reduce need for insulin, patient states goal of being off of insulin altogether. -Decreased dose of basal insulin Lantus (insulin glargine) from 30 units daily to 24 units daily. -Decreased dose of rapid insulin Novolog (insulin aspart) from 10 units 3 times daily with meals to 8 units 3 times daily with meals.  - Begin GLP-1 Ozempic (semaglutide) 0.25 mg weekly.  -Patient educated on purpose, proper use, and potential adverse effects of starting Ozempic, nausea, other GI symptoms.  -Extensively discussed pathophysiology of diabetes, recommended lifestyle interventions, dietary effects on blood sugar control.  -Counseled on s/sx of and management  of hypoglycemia.   Written patient instructions provided. Patient verbalized understanding of treatment plan.  Total time in face to face counseling 40 minutes.    Follow-up:  Pharmacist in 2 months. PCP clinic visit in 1 month.  Patient seen with Martina Sinner, PharmD Candidate, Jeneen Rinks,  PharmD PGY-1 Resident, and  Joseph Art, PharmD, PGY2 Pharmacy Resident.  [name]

## 2022-08-13 ENCOUNTER — Telehealth: Payer: Self-pay

## 2022-08-13 NOTE — Telephone Encounter (Signed)
A Prior Authorization was initiated for this patients OZEMPIC through CoverMyMeds.   Key: [id number]

## 2022-08-14 NOTE — Telephone Encounter (Signed)
Prior Auth for patients medication OZEMPIC denied by [employer] via CoverMyMeds.   Reason: may be considered for approval when used in certain situations (when you have diabetes with ASCVD, heart failure, or CKD); when you've had trial failure to metformin or a metformin containing product.  CoverMyMeds Key: [id number]

## 2022-08-15 ENCOUNTER — Other Ambulatory Visit (HOSPITAL_COMMUNITY): Payer: Self-pay

## 2022-08-16 ENCOUNTER — Telehealth: Payer: Self-pay

## 2022-08-16 NOTE — Telephone Encounter (Signed)
Attempted to contact patient to update her regarding the Ozempic PA being denied. LVM with requesting patient to call back.  Joseph Art, Pharm.D. PGY-2 Ambulatory Care Pharmacy Resident 08/16/2022 2:40 PM

## 2022-08-21 ENCOUNTER — Telehealth: Payer: Self-pay

## 2022-08-21 MED ORDER — METFORMIN HCL ER 500 MG PO TB24: 500.0000 mg | ORAL_TABLET | Freq: Two times a day (BID) | ORAL | 3 refills | Status: DC

## 2022-08-21 NOTE — Telephone Encounter (Signed)
Noted and agree. 

## 2022-08-21 NOTE — Telephone Encounter (Signed)
Called patient to discuss Ozempic prescription denial.   Patient states she was ready to call me after she received her formal prescription denial of her Ozempic via the mail.   She reports no low blood glucose readings since our last visit on 08/09/2022.  Her lowest reading was 77.   Following some discussion of "stepped-care" requirement to use Ozempic (semaglutide) - patient accepted that a trial of metformin would be our next step.   Patient educated on purpose, proper use and potential adverse effects of diarrhea/GI distress.  Following instruction patient verbalized understanding of treatment plan. She was educated to take one daily for at least 1 week, if tolerated she could increase to two tablets daily (dosed at the same time or split into two doses).   New prescription sent to pharmacy.   Patient plans to follow-up with PCP in 1 month.  At that visit, consider initiation of Ozempic at that time.

## 2022-08-21 NOTE — Telephone Encounter (Signed)
Pt called to cancel her appointment for tomorrow 9/27. She said tell Dr. Delsa Sale she will call back in another week or two. She is going through something personal.

## 2022-08-21 NOTE — Addendum Note (Signed)
Addended by: Leavy Cella on: 08/21/2022 11:14 AM   Modules accepted: Orders

## 2022-08-22 ENCOUNTER — Inpatient Hospital Stay (HOSPITAL_BASED_OUTPATIENT_CLINIC_OR_DEPARTMENT_OTHER): Payer: Medicaid Other | Admitting: Obstetrics & Gynecology

## 2022-08-22 DIAGNOSIS — Z8542 Personal history of malignant neoplasm of other parts of uterus: Secondary | ICD-10-CM

## 2022-09-04 NOTE — Progress Notes (Signed)
Canceled appointment.

## 2022-09-25 ENCOUNTER — Ambulatory Visit (INDEPENDENT_AMBULATORY_CARE_PROVIDER_SITE_OTHER): Payer: Medicaid Other | Admitting: Family Medicine

## 2022-09-25 ENCOUNTER — Encounter: Payer: Self-pay | Admitting: Family Medicine

## 2022-09-25 VITALS — BP 128/70 | HR 108 | Ht 65.0 in | Wt 286.8 lb

## 2022-09-25 DIAGNOSIS — E1165 Type 2 diabetes mellitus with hyperglycemia: Secondary | ICD-10-CM

## 2022-09-25 LAB — POCT GLYCOSYLATED HEMOGLOBIN (HGB A1C): HbA1c, POC (controlled diabetic range): 5.2 % (ref 0.0–7.0)

## 2022-09-25 NOTE — Progress Notes (Unsigned)
    SUBJECTIVE:   CHIEF COMPLAINT / HPI:   Patient presents for a diabetes follow up.   Was hospitalized about 8 months ago for DKA and was started on insulin at that time. At follow up 4 months ago a1c was down to 5.6 after being very diligent about the food she eats. Still endorses eating well and has cut out all sugar. She follows with Dr. Valentina Lucks and the pharmacy team as well who helps titrate her insulin.  Currently on Lantus 24 units daily, Novolog 8 units TID.  Would like to come off Novolog   Brought in an extensive blood sugar log since 9/14 when she had a visit with Dr. Valentina Lucks.  The lowest fasting sugars on 9/19 was 74, 10/19 70  Fasting sugars range from about 90-115  Would like to eventually come off insulin but requests to come off of short acting insulin today. Asks about Metformin and if she should start it.   PERTINENT  PMH / PSH: Reviewed   OBJECTIVE:   BP 128/70   Pulse (!) 108   Ht '5\' 5"'$  (1.651 m)   Wt 286 lb 12.8 oz (130.1 kg)   LMP 09/20/2012   SpO2 94%   BMI 47.73 kg/m    General: alert, pleasant, NAD CV: Tachycardic Resp: CTAB normal WOB GI: soft, non distended   ASSESSMENT/PLAN:   Type 2 diabetes mellitus with hyperglycemia (HCC) A1c today 5.2 down from 5.6 four months ago and >15.5 eight months ago. Congratulated patient on a great job taking care of her health. We have eliminated the short acting insulin and I advised her to continue Lantus 24 units daily and start Metformin '500mg'$  daily for the first week, and will increase to '500mg'$  BID once she is able to tolerate it. She was not able to start Ozempic due to insurance not approving it and instead was asked to try Metformin first. Advised her to continue recording her fasting blood sugars and to let me know if there are lows or highs so that we can adjust insulin if needed. She will follow up with pharmacy team in 1 month and then myself in 2-3 months.     [place]

## 2022-09-25 NOTE — Patient Instructions (Signed)
It was great seeing you today!  You came in to follow up on your diabetes and we rechecked your a1c which was 5.2 so great job! You can come off of the Novolog and we will add Metformin '500mg'$  daily and if tolerating it well can do twice a day. We will work on cutting down your lantus as well!   Please check-out at the front desk before leaving the clinic. I'd like to see you back in Dec or Jan, but if you need to be seen earlier than that for any new issues we're happy to fit you in, just give [place] a call!  Feel free to call with any questions or concerns at any time, at [phone].   Take care,  Dr. [name] [facility]

## 2022-09-26 NOTE — Assessment & Plan Note (Signed)
A1c today 5.2 down from 5.6 four months ago and >15.5 eight months ago. Congratulated patient on a great job taking care of her health. We have eliminated the short acting insulin and I advised her to continue Lantus 24 units daily and start Metformin '500mg'$  daily for the first week, and will increase to '500mg'$  BID once she is able to tolerate it. She was not able to start Ozempic due to insurance not approving it and instead was asked to try Metformin first. Advised her to continue recording her fasting blood sugars and to let me know if there are lows or highs so that we can adjust insulin if needed. She will follow up with pharmacy team in 1 month and then myself in 2-3 months.

## 2022-10-02 ENCOUNTER — Other Ambulatory Visit: Payer: Self-pay | Admitting: Family Medicine

## 2022-10-02 DIAGNOSIS — F419 Anxiety disorder, unspecified: Secondary | ICD-10-CM

## 2022-10-03 ENCOUNTER — Other Ambulatory Visit: Payer: Self-pay | Admitting: Family Medicine

## 2022-10-03 DIAGNOSIS — F32A Depression, unspecified: Secondary | ICD-10-CM

## 2022-10-04 MED ORDER — DIAZEPAM 5 MG PO TABS: ORAL_TABLET | ORAL | 0 refills | Status: DC

## 2022-11-06 ENCOUNTER — Other Ambulatory Visit: Payer: Self-pay | Admitting: Family Medicine

## 2022-11-06 DIAGNOSIS — F419 Anxiety disorder, unspecified: Secondary | ICD-10-CM

## 2022-12-11 ENCOUNTER — Other Ambulatory Visit: Payer: Self-pay | Admitting: Family Medicine

## 2022-12-12 MED ORDER — INSULIN PEN NEEDLE 32G X 4 MM MISC: 3 refills | Status: AC

## 2022-12-13 ENCOUNTER — Ambulatory Visit (INDEPENDENT_AMBULATORY_CARE_PROVIDER_SITE_OTHER): Payer: Medicaid Other | Admitting: Pharmacist

## 2022-12-13 ENCOUNTER — Encounter: Payer: Self-pay | Admitting: Pharmacist

## 2022-12-13 ENCOUNTER — Other Ambulatory Visit (HOSPITAL_COMMUNITY): Payer: Self-pay

## 2022-12-13 VITALS — BP 108/69 | HR 109 | Wt 276.8 lb

## 2022-12-13 DIAGNOSIS — E1165 Type 2 diabetes mellitus with hyperglycemia: Secondary | ICD-10-CM

## 2022-12-13 DIAGNOSIS — E1110 Type 2 diabetes mellitus with ketoacidosis without coma: Secondary | ICD-10-CM | POA: Diagnosis not present

## 2022-12-13 MED ORDER — OZEMPIC (0.25 OR 0.5 MG/DOSE) 2 MG/3ML ~~LOC~~ SOPN: 0.2500 mg | PEN_INJECTOR | SUBCUTANEOUS | 2 refills | Status: DC

## 2022-12-13 MED ORDER — LANTUS SOLOSTAR 100 UNIT/ML ~~LOC~~ SOPN: 20.0000 [IU] | PEN_INJECTOR | Freq: Every morning | SUBCUTANEOUS | 6 refills | Status: DC

## 2022-12-13 NOTE — Patient Instructions (Addendum)
It was nice to see you today!  [phone] - Sports medicine center. Call to address "trigger finger"   Your goal blood sugar is 80-130 before eating and less than 180 after eating.  Medication Changes: Begin Ozempic (semaglutide) 0.25 mg weekly  Continue metformin 500 mg twice daily  Decrease the Lantus (insulin glargine) to 20 units daily.   Monitor blood sugars at home and keep a log (glucometer or piece of paper) to bring with you to your next visit.  Keep up the good work with diet and exercise. Aim for a diet full of vegetables, fruit and lean meats (chicken, [place], fish). Try to limit salt intake by eating fresh or frozen vegetables (instead of canned), rinse canned vegetables prior to cooking and do not add any additional salt to meals.

## 2022-12-13 NOTE — Progress Notes (Signed)
S:    Chief Complaint  Patient presents with   Medication Management    Diabetes   57 y.o. female who presents for diabetes evaluation, education, and management.  PMH is significant for DKA, AKI, CHF. Patient was referred by Dr. Arby Barrette on 05/02/2022. Patient was last seen by PCP on 09/25/2022.   At last visit, A1c was 5.2% and Novolog was discontinued.   Today, patient arrives in good spirits and presents without any assistance. Reports some GI intolerance since starting metformin, but this has mostly resolved. Patient also complains of left thumb pain and believes she may have trigger finger.   Current diabetes medications include: metformin XR 500 mg BID, Lantus (insulin glargine) 24 units daily.  Current hypertension medications include: carvedilol 3.125 mg 1 tab every morning, 2 tablets at night  Patient reports adherence to taking all medications as prescribed.   Patient denies hypoglycemic events.  Reported home fasting blood sugars: 88-100; highest seen was 136  Reported 2 hour post-meal/random blood sugars: not checking since discontinuing Novolog.  Patient reports nocturia (nighttime urination). ~1x per night Patient reports neuropathy (nerve pain). Left hand tingles sometimes. Patient denies visual changes. Patient reports self foot exams.   Patient reported dietary habits: Eats 3 meals/day Snacks: banana, apple Drinks: water, zero sugar tea, coke zero   Patient-reported exercise habits: walking to the mailbox/mail office, walking around apartment when it is cold outside.  Patient identified Weight goal for the 2024 year is a weight of 260 (16 lbs less than today)   260lbs is 9lbs less than her lowest adults weight in > 15 years  O:  Review of Systems  Cardiovascular:  Positive for leg swelling.  Musculoskeletal:  Positive for joint pain.  All other systems reviewed and are negative.   Physical Exam Constitutional:      Appearance: Normal appearance. She is  obese.  Pulmonary:     Effort: Pulmonary effort is normal.     Breath sounds: Normal breath sounds.  Musculoskeletal:        General: Deformity (arm deformity; additional left thumb pain "trigger finger") present.  Neurological:     Mental Status: She is alert.  Psychiatric:        Mood and Affect: Mood normal.        Behavior: Behavior normal.        Thought Content: Thought content normal.        Judgment: Judgment normal.      Lab Results  Component Value Date   HGBA1C 5.2 09/25/2022   Vitals:   12/13/22 1407  BP: 108/69  Pulse: (!) 109  SpO2: 97%    Lipid Panel     Component Value Date/Time   CHOL 165 06/14/2021 1437   TRIG 91 06/14/2021 1437   HDL 46 06/14/2021 1437   CHOLHDL 3.6 06/14/2021 1437   CHOLHDL 5.1 07/17/2013 0923   VLDL 30 07/17/2013 0923   LDLCALC 102 (H) 06/14/2021 1437   LDLDIRECT 130 (H) 02/14/2012 1424    Clinical Atherosclerotic Cardiovascular Disease (ASCVD): No   Patient is participating in a Managed Medicaid Plan:  Yes   A/P: Diabetes longstanding currently at goal based on A1c. Patient is able to verbalize appropriate hypoglycemia management plan. Medication adherence appears appropriate. -Decrease Lantus (insulin glargine) from 24 to 20 units daily.  -Started GLP-1 Ozempic (semaglutide) 0.25 mg weekly.  -Continued metformin XR 500 mg BID. -Patient educated on purpose, proper use, and potential adverse effects.  -Extensively discussed pathophysiology of  diabetes, recommended lifestyle interventions, dietary effects on blood sugar control.  -Counseled on s/sx of and management of hypoglycemia.  -Next A1c anticipated March 2024.   Painful left thumb. Evaluated by attending, Dr. Nori Riis, who agrees patient is likely suffering from trigger finger. Dr. Nori Riis recommended patient follow up with Sports Medicine to for her thumb to be evaluated.  -Provided patient with contact information to set up an appointment with at the Sports Medicine  office  Written patient instructions provided. Patient verbalized understanding of treatment plan.  Total time in face to face counseling 30 minutes.    Follow-up:  Pharmacist March 2024. PCP clinic visit in February 2024.  Patient seen with Dixon Boos,  PharmD Candidate and Joseph Art, PharmD, PGY2 Pharmacy Resident.

## 2022-12-13 NOTE — Assessment & Plan Note (Signed)
Diabetes longstanding currently at goal based on A1c. Patient is able to verbalize appropriate hypoglycemia management plan. Medication adherence appears appropriate. -Decrease Lantus (insulin glargine) from 24 to 20 units daily.  -Started GLP-1 Ozempic (semaglutide) 0.25 mg weekly.  -Continued metformin XR 500 mg BID. -Patient educated on purpose, proper use, and potential adverse effects.  -Extensively discussed pathophysiology of diabetes, recommended lifestyle interventions, dietary effects on blood sugar control.

## 2022-12-14 ENCOUNTER — Other Ambulatory Visit: Payer: Self-pay | Admitting: Family Medicine

## 2022-12-14 NOTE — Progress Notes (Signed)
Reviewed: I agree with Dr. Graylin Shiver documentation and management.

## 2022-12-17 ENCOUNTER — Other Ambulatory Visit: Payer: Self-pay | Admitting: Pharmacist

## 2023-01-03 IMAGING — CR DG CHEST 2V
2 series · 2 of 2 positions shown · non-contrast
Comparison: 06/16/2015

CLINICAL DATA: 55-year-old female with chest pain.

EXAM:
CHEST - 2 VIEW

[w chest lat]
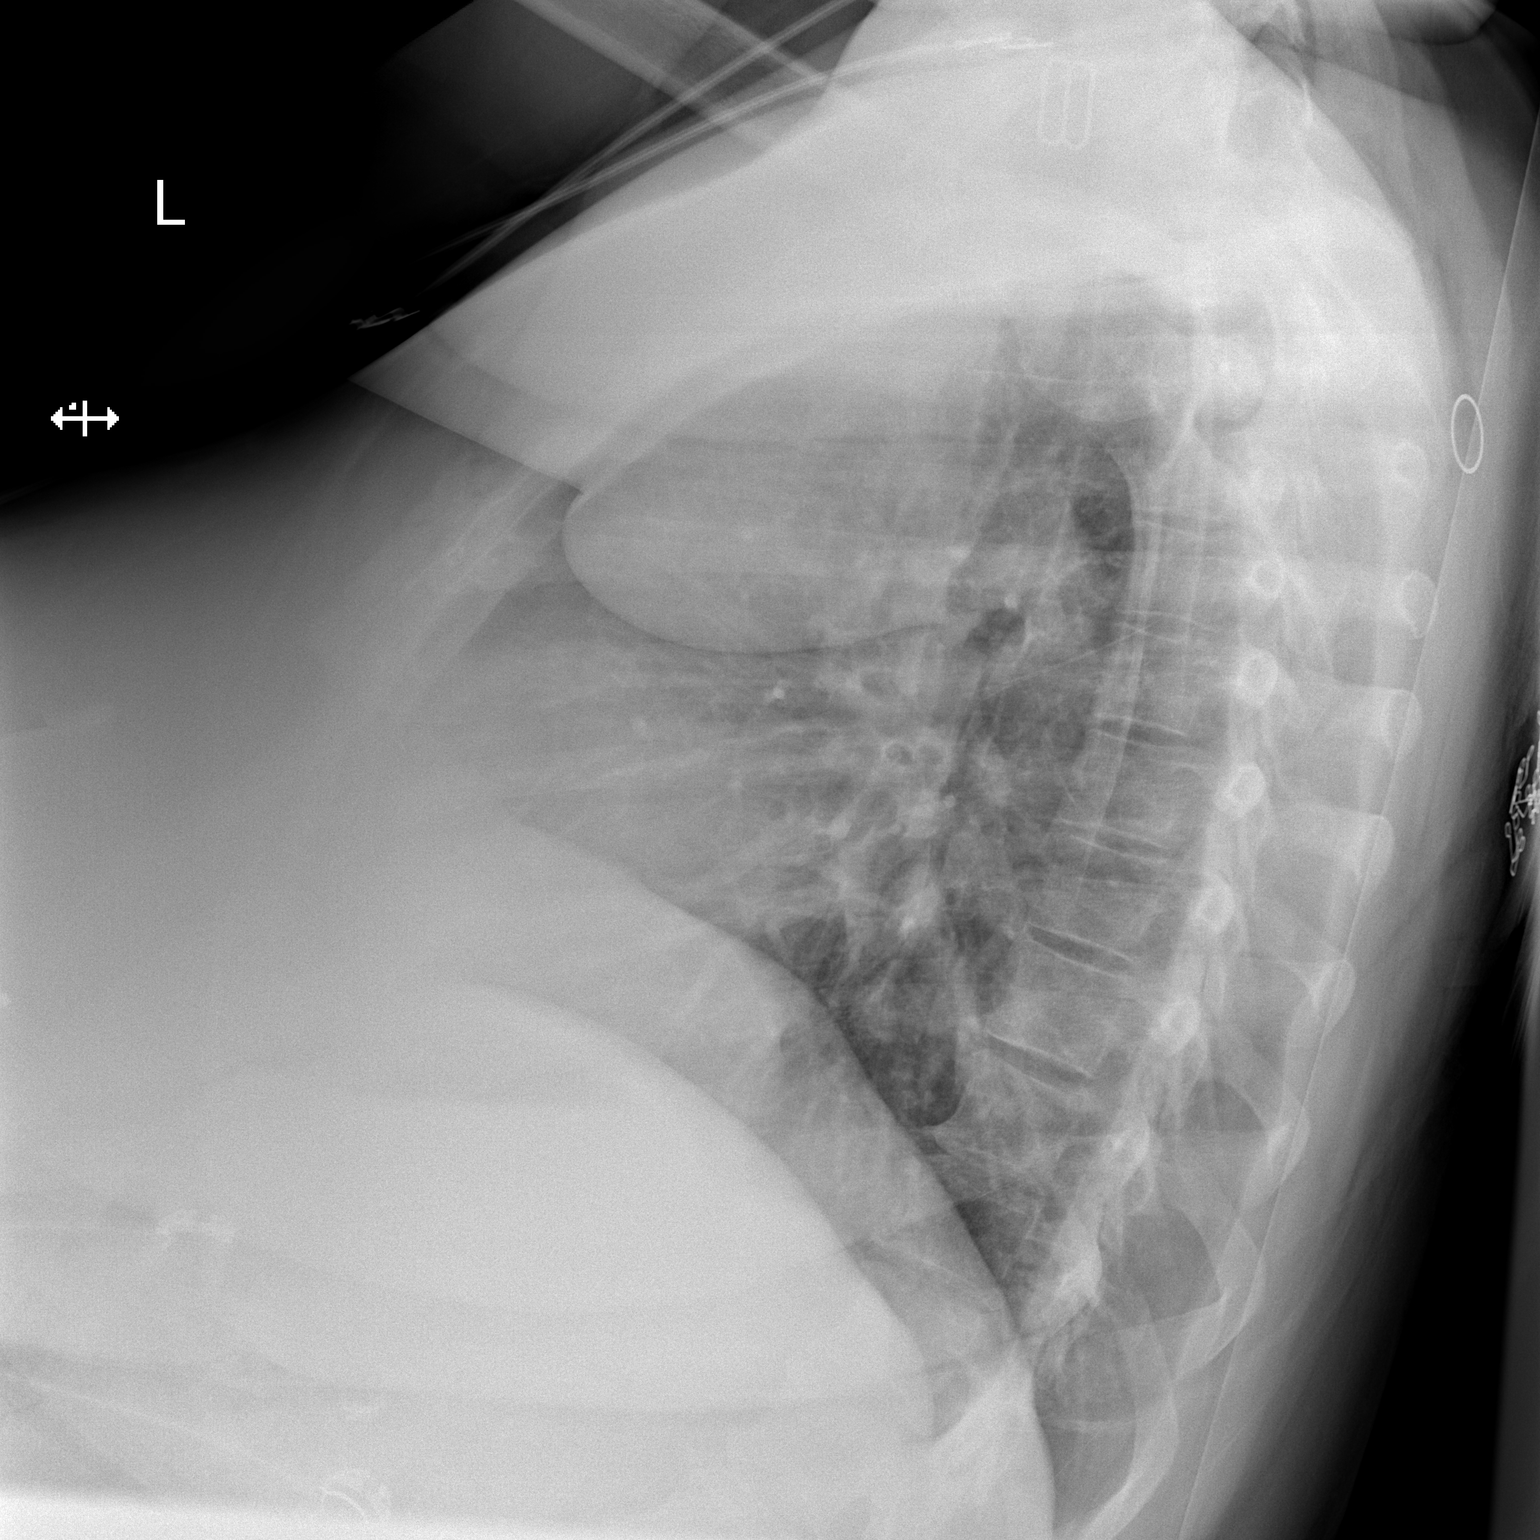

[x chest ap]
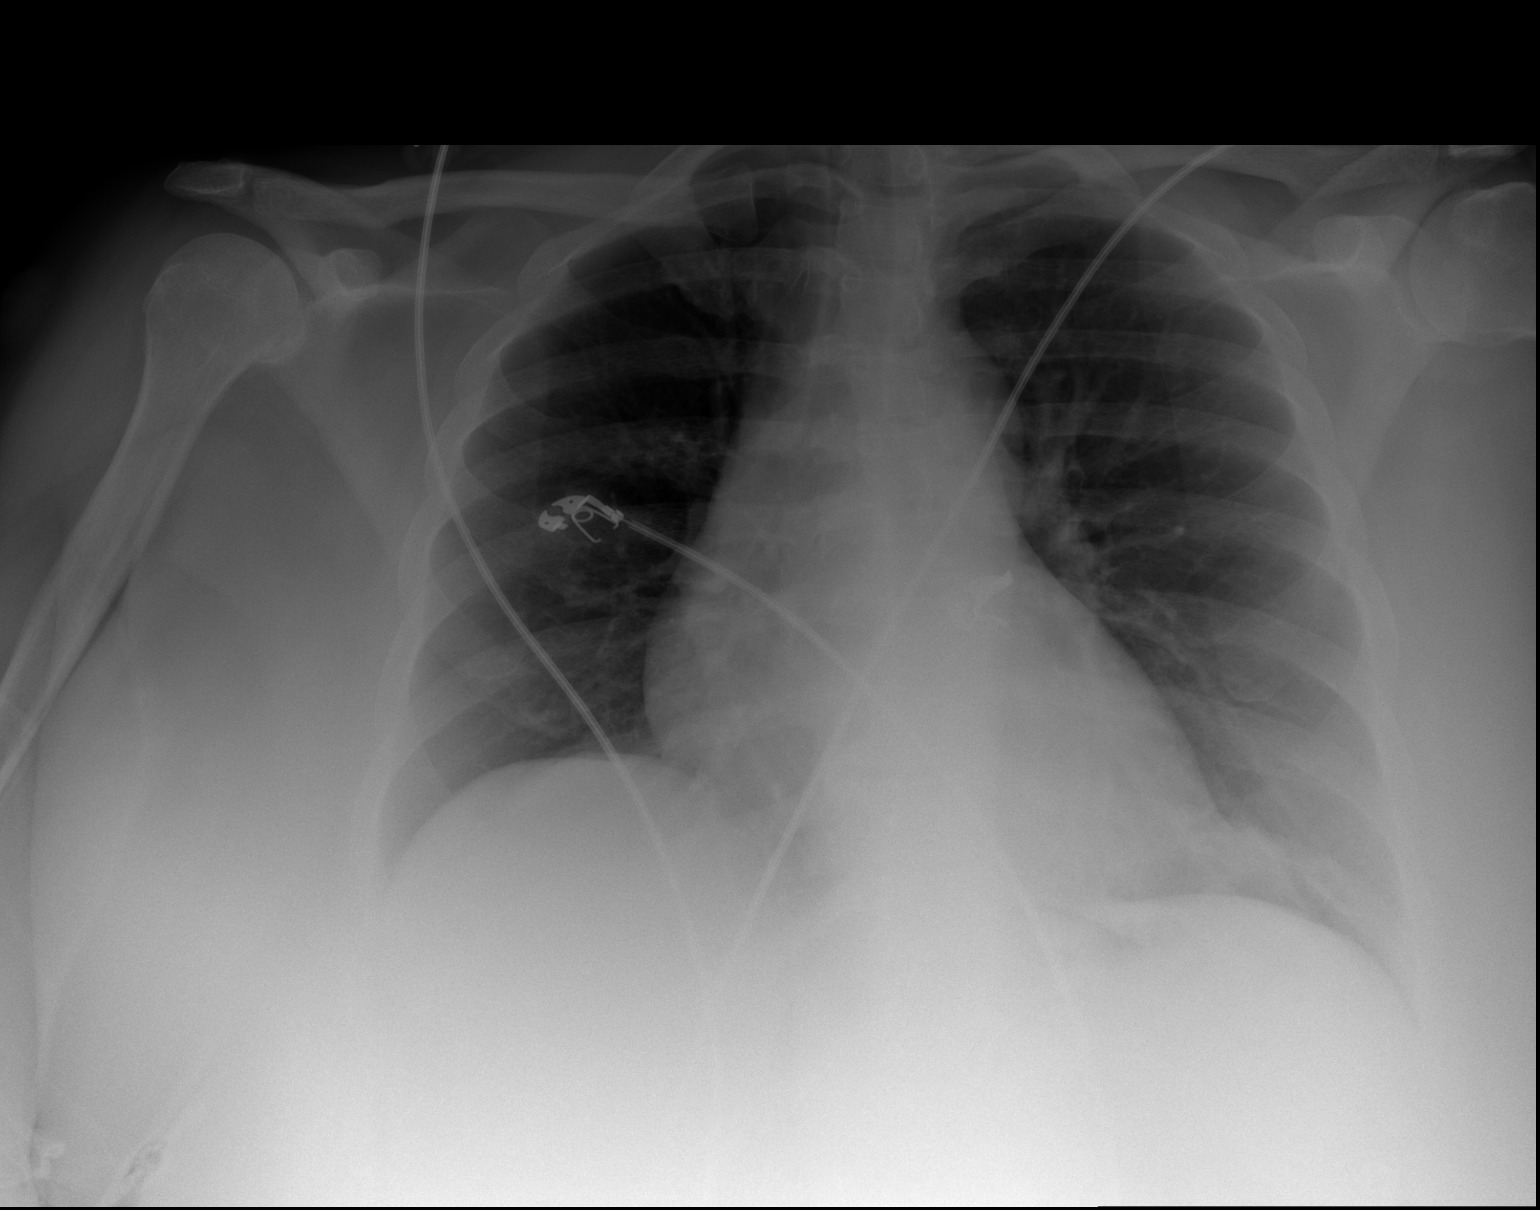

[2 of 2 positions shown; findings below may reference images not displayed]

FINDINGS: The mediastinal contours are within normal limits. No cardiomegaly.
The lungs are clear bilaterally without evidence of focal
consolidation, pleural effusion, or pneumothorax. No acute osseous
abnormality.
IMPRESSION: No acute cardiopulmonary process.

## 2023-01-03 IMAGING — CR DG FOOT COMPLETE 3+V*R*
3 series · 3 of 3 positions shown · non-contrast
Comparison: 10/02/2019

CLINICAL DATA: Right foot pain and swelling for 3-4 weeks.

EXAM:
RIGHT FOOT COMPLETE - 3+ VIEW

[x foot ap right]
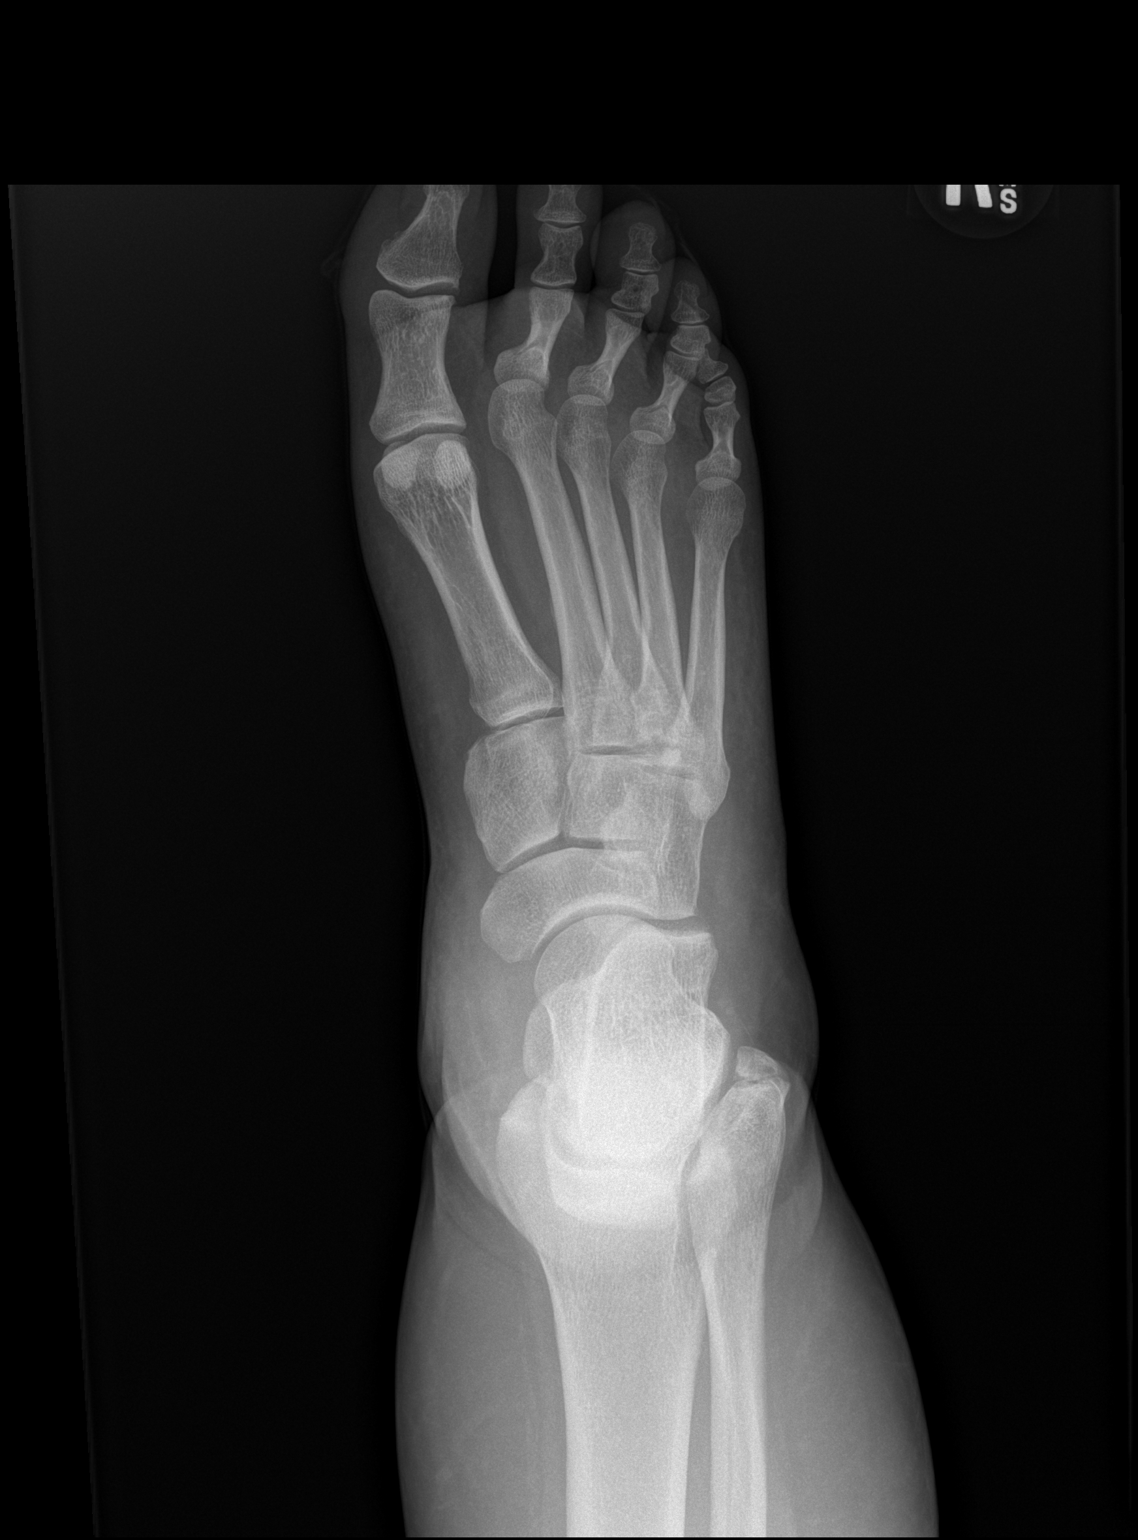

[x foot obl right]
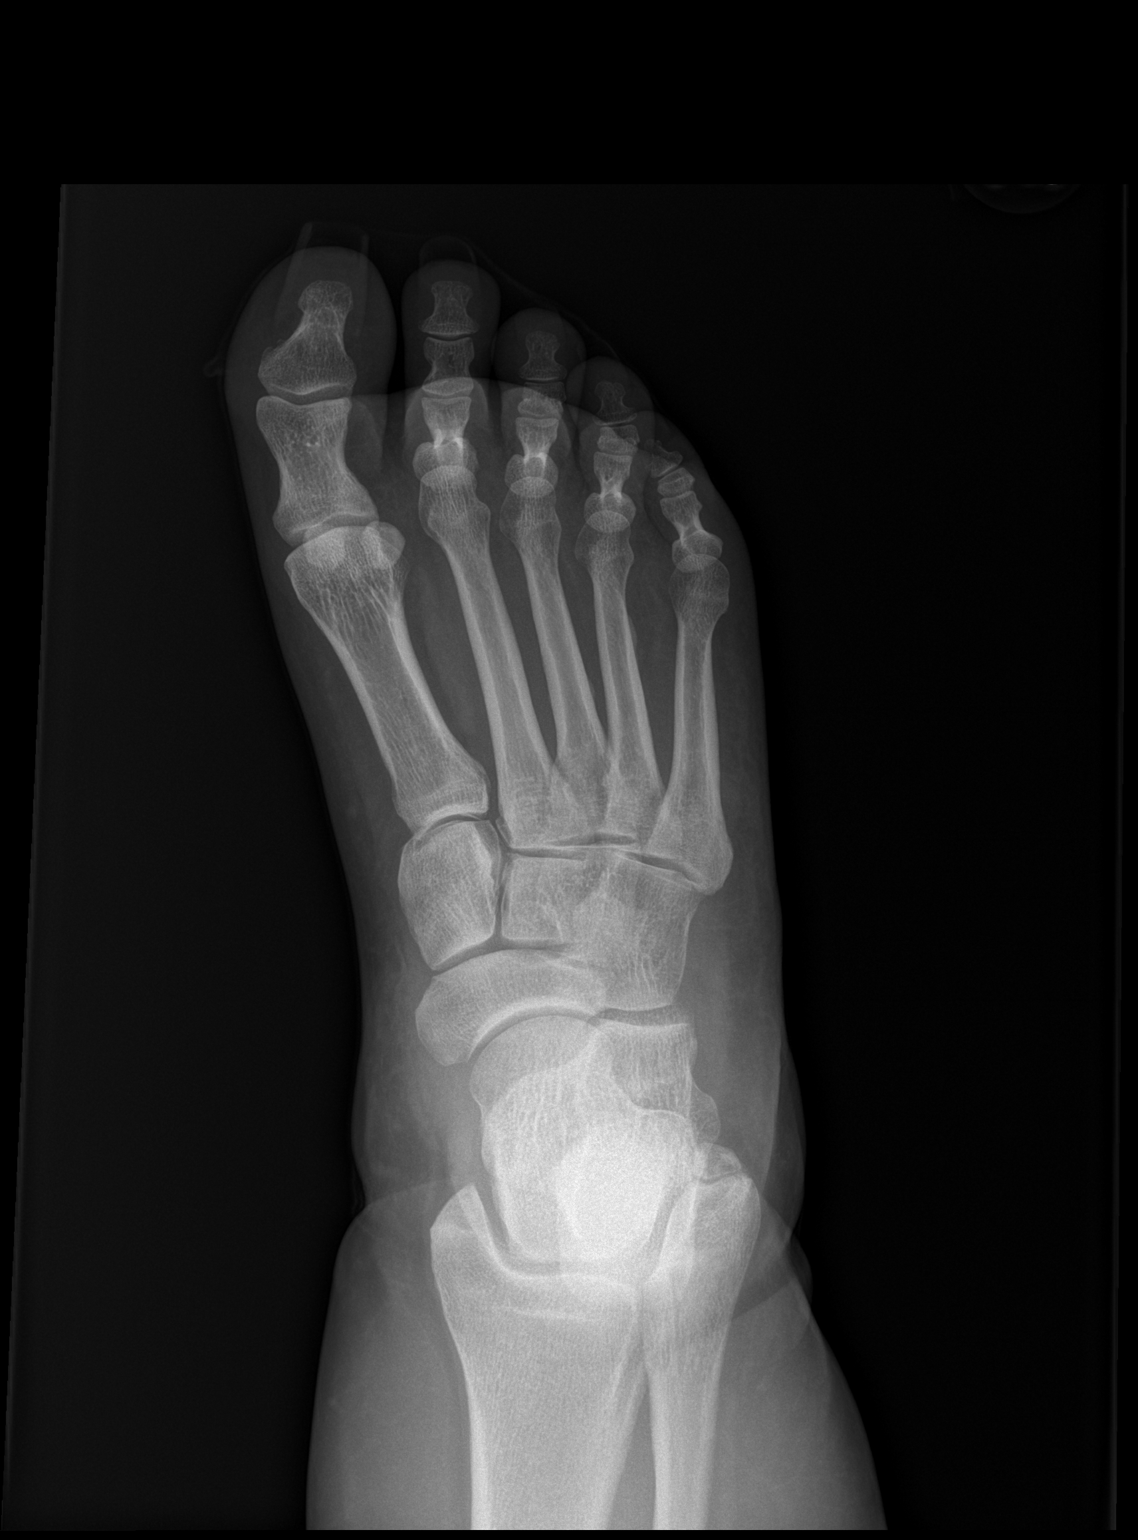

[x foot lat right]
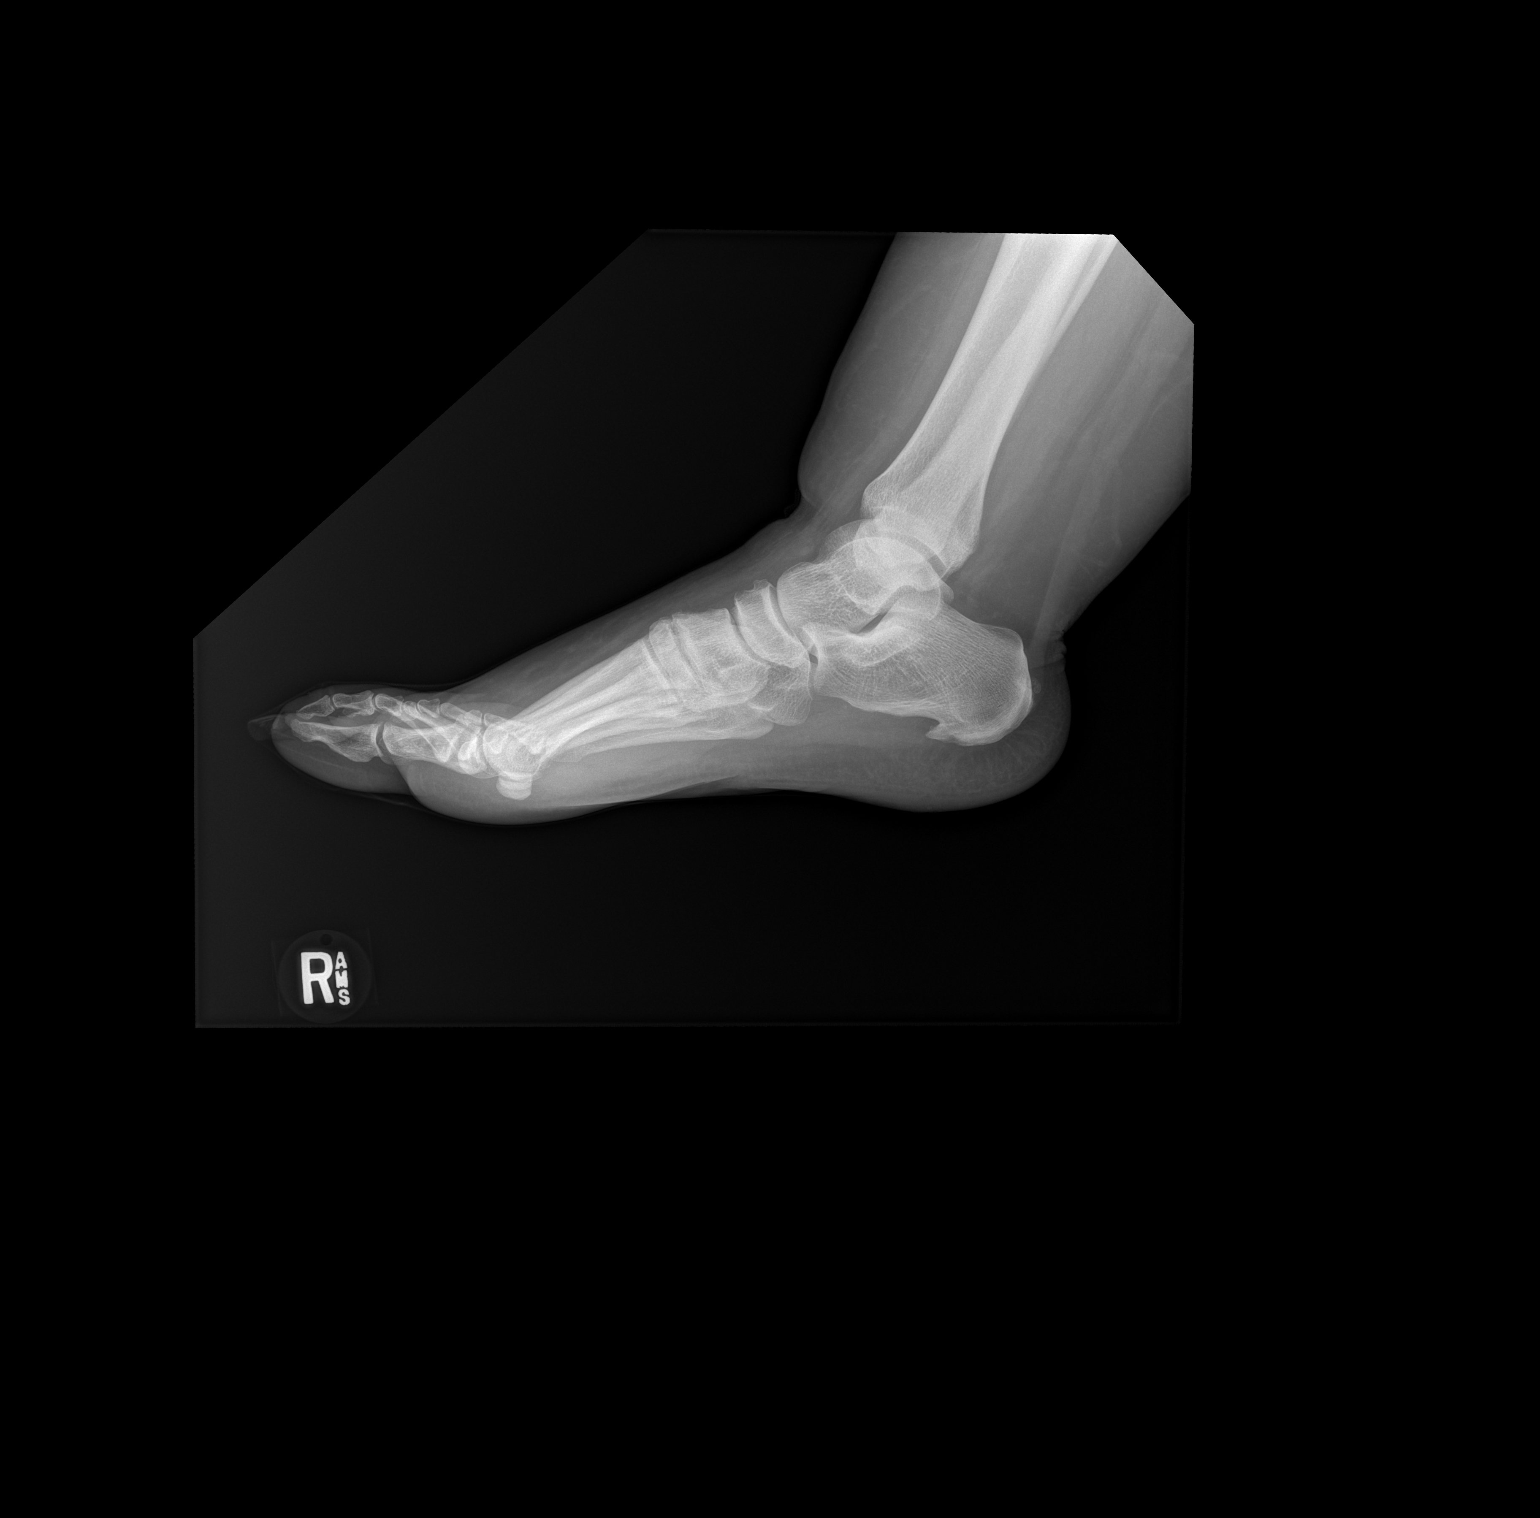

[3 of 3 positions shown; findings below may reference images not displayed]

FINDINGS: No fracture or bone lesion.

Joints normally spaced and aligned.  No arthropathic changes.

Moderate plantar and small dorsal calcaneal spurs.
IMPRESSION: 1. No fracture, bone lesion or joint abnormality.
2. Calcaneal spurs.

## 2023-01-11 ENCOUNTER — Ambulatory Visit (INDEPENDENT_AMBULATORY_CARE_PROVIDER_SITE_OTHER): Payer: Medicaid Other | Admitting: Family Medicine

## 2023-01-11 ENCOUNTER — Encounter: Payer: Self-pay | Admitting: Family Medicine

## 2023-01-11 VITALS — BP 128/80 | HR 106 | Temp 97.6°F | Wt 274.4 lb

## 2023-01-11 DIAGNOSIS — F419 Anxiety disorder, unspecified: Secondary | ICD-10-CM

## 2023-01-11 DIAGNOSIS — E1165 Type 2 diabetes mellitus with hyperglycemia: Secondary | ICD-10-CM | POA: Diagnosis not present

## 2023-01-11 DIAGNOSIS — F32A Depression, unspecified: Secondary | ICD-10-CM | POA: Diagnosis not present

## 2023-01-11 LAB — POCT GLYCOSYLATED HEMOGLOBIN (HGB A1C): HbA1c, POC (controlled diabetic range): 5.8 % (ref 0.0–7.0)

## 2023-01-11 MED ORDER — DIAZEPAM 5 MG PO TABS: ORAL_TABLET | ORAL | 0 refills | Status: DC

## 2023-01-11 MED ORDER — METFORMIN HCL ER 500 MG PO TB24: 500.0000 mg | ORAL_TABLET | Freq: Every day | ORAL | 0 refills | Status: DC

## 2023-01-11 MED ORDER — LANTUS SOLOSTAR 100 UNIT/ML ~~LOC~~ SOPN: 15.0000 [IU] | PEN_INJECTOR | Freq: Every day | SUBCUTANEOUS | 11 refills | Status: DC

## 2023-01-11 NOTE — Assessment & Plan Note (Deleted)
A1c today 5.8 and at last check 3 months ago was 5.2 Currently on Metformin 566m BID, Lantus 20 units daily and recently started on Ozempic .263mweekly. Given Deborah [name] stable A1c will further decrease Lantus to 15 units daily and reduce Metformin to 50042maily. Will continue to increase dose of Ozempic. Will follow up in 3 months for next A1c check

## 2023-01-11 NOTE — Patient Instructions (Signed)
It was great seeing you today!  You came in to follow up on your diabetes. Your A1c today is 5.8 which is still a good range! We are going to decrease your long acting insulin to 15 daily and switch your metformin to once a day 551m long acting.   I will see you back in 3 months for your next diabetes follow up, but if you need to be seen earlier than that for any new issues we're happy to fit you in, just give [place] call!  Visit Reminders: - Stop by the pharmacy to pick up your prescriptions  - Continue to work on your healthy eating habits and incorporating exercise into your daily life.  Feel free to call with any questions or concerns at any time, at [phone]   Take care,  [facility]

## 2023-01-11 NOTE — Progress Notes (Signed)
    SUBJECTIVE:   CHIEF COMPLAINT / HPI:   Patient presents for diabetes follow up  She was seen by pharmacy team in January and given her last A1c of 5.2 NovoLog was discontinued, Lantus was decreased from 24 units daily to 20 units.  She was started on Ozempic 0.25 mg weekly Takes ozempic on Mondays. Nausea improving.  Has been having some constipation  She brought in log of her fasting blood sugars which have ranged between 80-low 100s Has been watching her diet with very limited carbs  She would like to discontinue Metformin if possible due to GI side effects  Wants to take lantus at 10 instead of 9   Requests refill of Diazepam   PERTINENT  PMH / PSH: Reviewed   OBJECTIVE:   BP 128/80   Pulse (!) 106   Temp 97.6 F (36.4 C)   Wt 274 lb 6.4 oz (124.5 kg)   LMP 09/20/2012   SpO2 99%   BMI 45.66 kg/m   Physical exam General: well appearing, NAD Cardiovascular: Tachycardic, no murmurs Lungs: CTAB. Normal WOB Abdomen: soft, non-distended, non-tender Skin: warm, dry  ASSESSMENT/PLAN:   Type 2 diabetes mellitus without complications (HCC) [id number] today 5.8 and at last check 3 months ago was 5.2 Currently on Metformin 532m BID, Lantus 20 units daily and recently started on Ozempic .23mweekly. Given her stable A1c will further decrease Lantus to 15 units daily and reduce Metformin to 50061maily. Will continue to increase dose of Ozempic. Will follow up in 3 months for next A1c check  HLD Last checked a year ago with mildly elevated LDL of 102. Not on medication. Will recheck lipid panel. Patient would benefit from starting on a statin given her diabetes. Will discuss with her.   [place]

## 2023-01-11 NOTE — Assessment & Plan Note (Signed)
A1c today 5.8 and at last check 3 months ago was 5.2 Currently on Metformin 550m BID, Lantus 20 units daily and recently started on Ozempic .250mweekly. Given her stable A1c will further decrease Lantus to 15 units daily and reduce Metformin to 50066maily. Will continue to increase dose of Ozempic. Will follow up in 3 months for next A1c check

## 2023-02-05 IMAGING — MG MM DIGITAL SCREENING BILAT W/ TOMO AND CAD
8 of 17 series · 8 of 40 positions shown · non-contrast
Comparison: Previous exam(s).

CLINICAL DATA: Screening.

EXAM:
DIGITAL SCREENING BILATERAL MAMMOGRAM WITH TOMOSYNTHESIS AND CAD
TECHNIQUE: Bilateral screening digital craniocaudal and mediolateral oblique
mammograms were obtained. Bilateral screening digital breast
tomosynthesis was performed. The images were evaluated with
computer-aided detection.

[R CC synth-2D (1 of 2)]
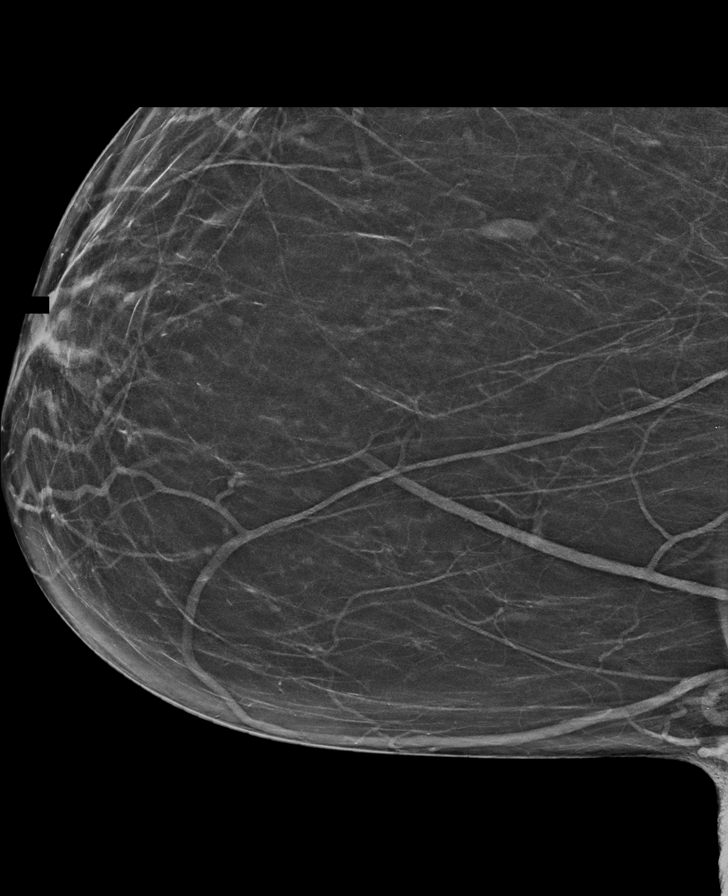

[R CC synth-2D (2 of 2)]
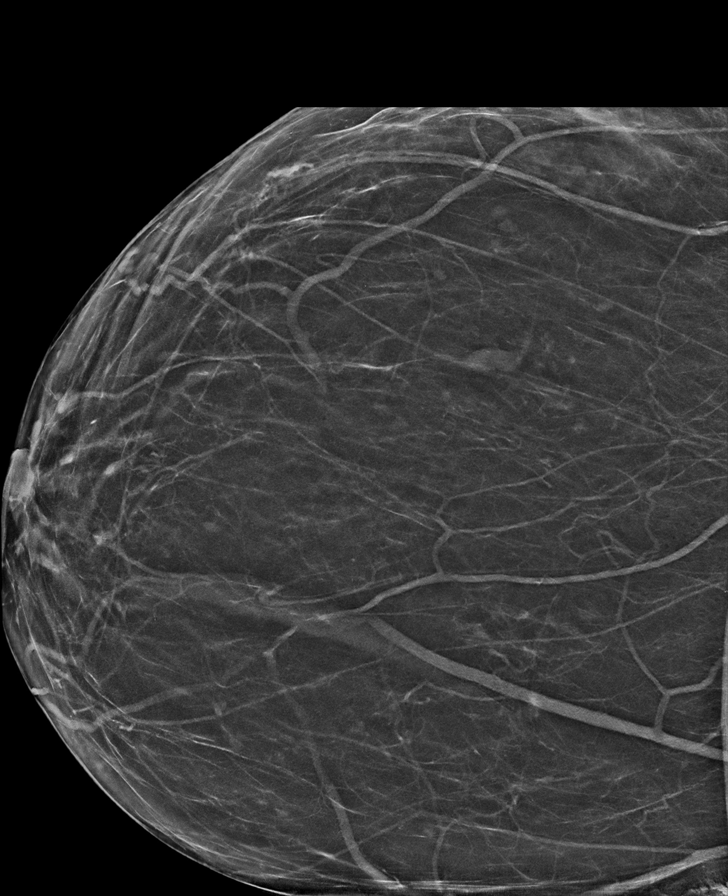

[R MLO synth-2D]
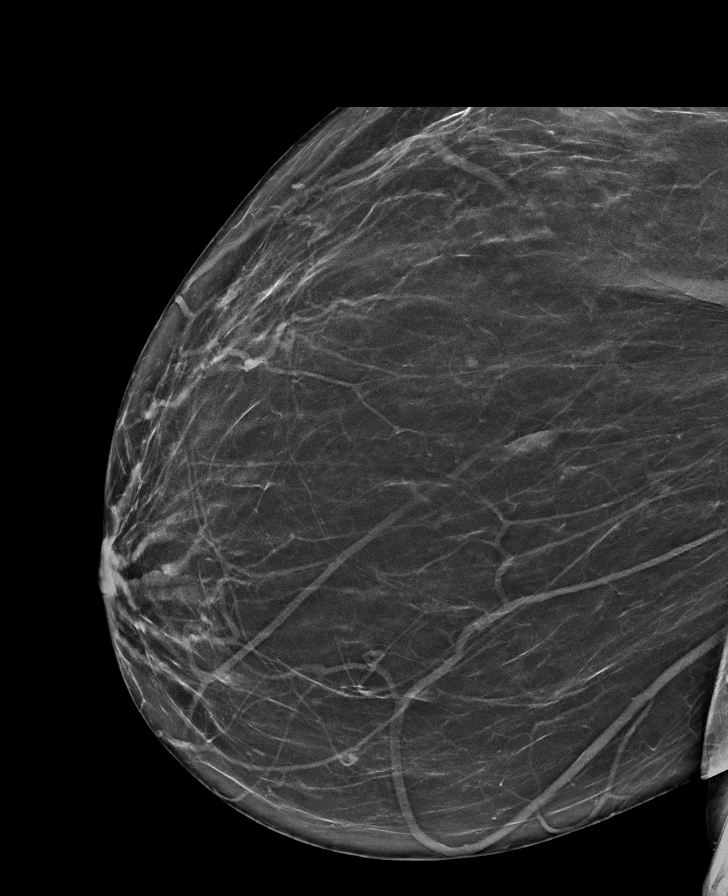

[L CC synth-2D (1 of 2)]
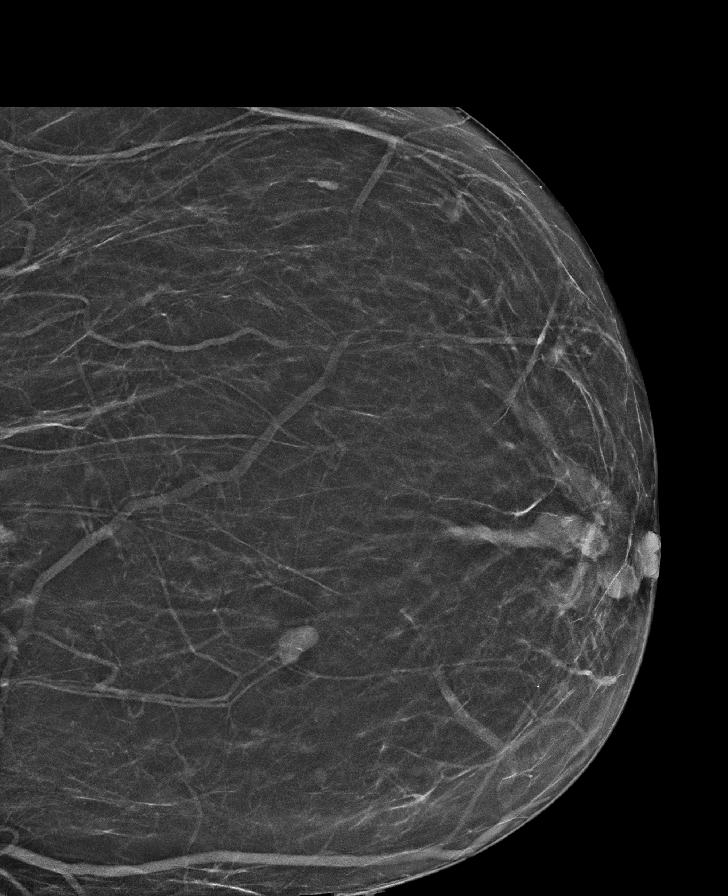

[L MLO synth-2D (1 of 2)]
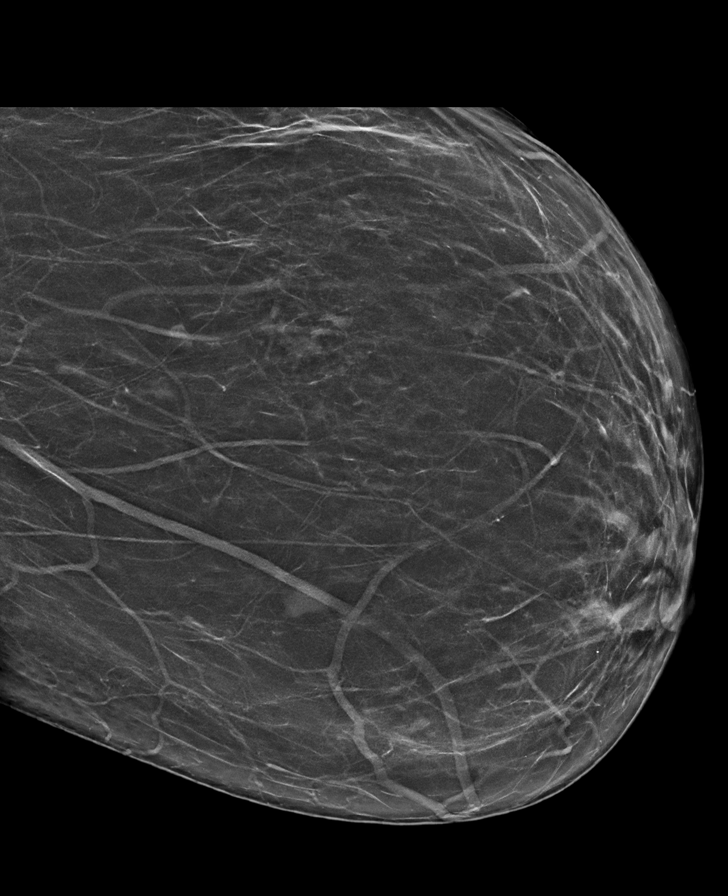

[L MLO synth-2D (2 of 2)]
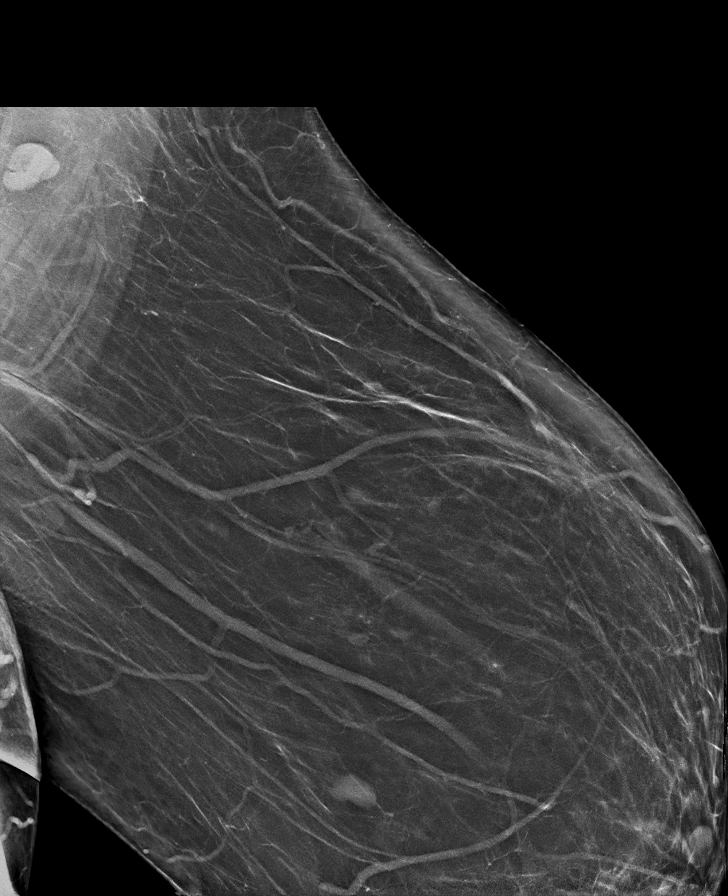

[L CC synth-2D (2 of 2)]
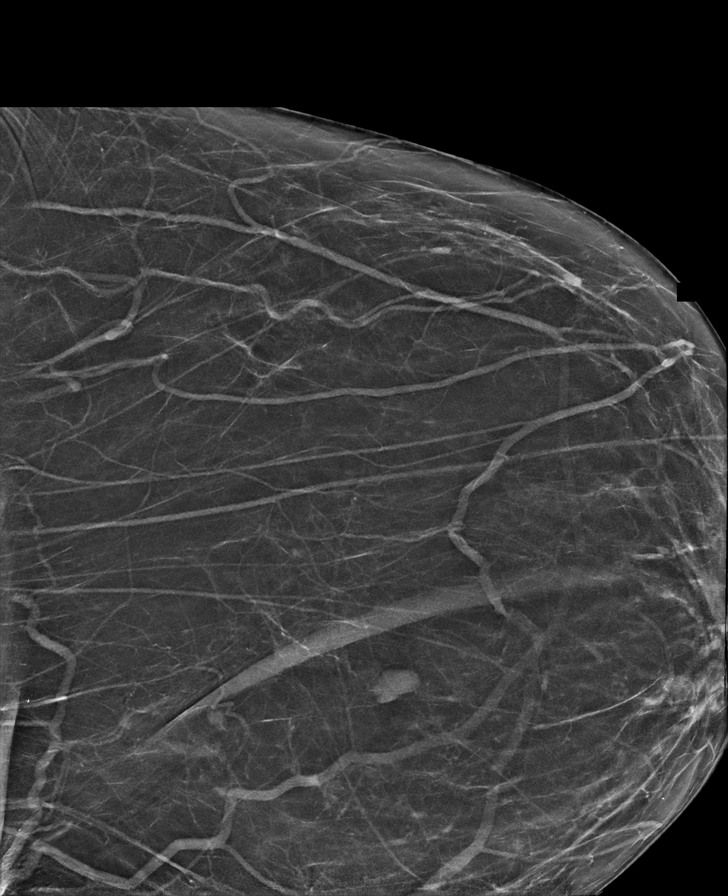

[R CC tomo · tomo slice 33/64.0]
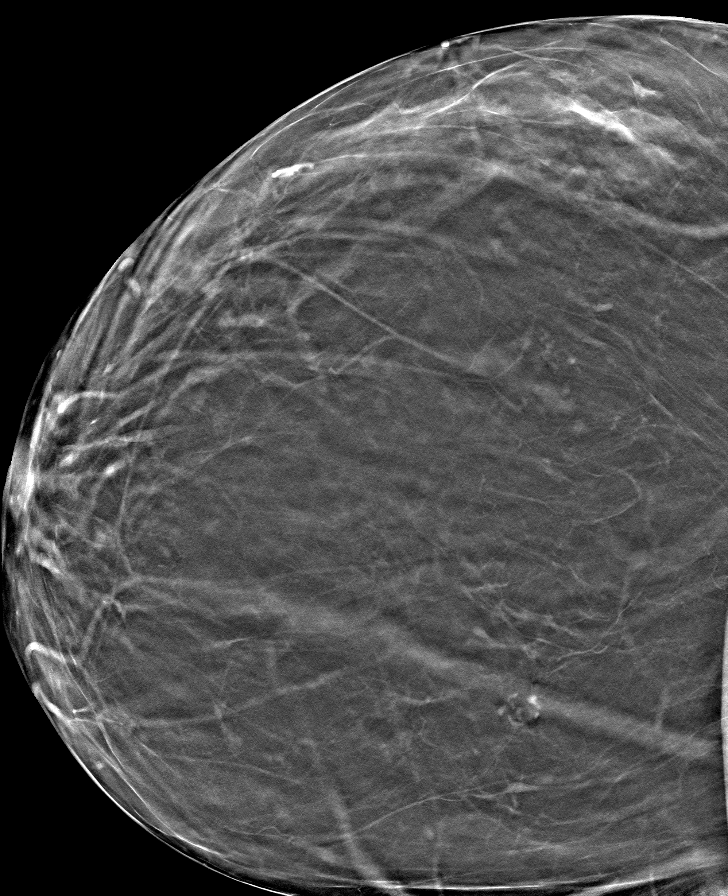

[8 of 40 positions shown; findings below may reference images not displayed]

ACR Breast Density Category b: There are scattered areas of
fibroglandular density.
FINDINGS: There are no findings suspicious for malignancy.
IMPRESSION: No mammographic evidence of malignancy. A result letter of this
screening mammogram will be mailed directly to the patient.

RECOMMENDATION:
Screening mammogram in one year. (Code:51-O-LD2)

BI-RADS CATEGORY  1: Negative.

## 2023-03-14 ENCOUNTER — Ambulatory Visit: Payer: Medicaid Other | Admitting: Pharmacist

## 2023-03-15 ENCOUNTER — Other Ambulatory Visit: Payer: Self-pay | Admitting: Family Medicine

## 2023-03-21 ENCOUNTER — Ambulatory Visit (INDEPENDENT_AMBULATORY_CARE_PROVIDER_SITE_OTHER): Payer: Medicaid Other | Admitting: Pharmacist

## 2023-03-21 VITALS — BP 122/74 | HR 82 | Wt 271.0 lb

## 2023-03-21 DIAGNOSIS — E1165 Type 2 diabetes mellitus with hyperglycemia: Secondary | ICD-10-CM

## 2023-03-21 MED ORDER — OZEMPIC (0.25 OR 0.5 MG/DOSE) 2 MG/3ML ~~LOC~~ SOPN: 0.5000 mg | PEN_INJECTOR | SUBCUTANEOUS | 2 refills | Status: DC

## 2023-03-21 NOTE — Assessment & Plan Note (Signed)
Diabetes longstanding. Patient is able to verbalize appropriate hypoglycemia management plan. Medication adherence appears good. Control is optimal due to blood sugars and recent A1c (5.8% 01/11/23) within goal. -Discontinued basal insulin Lantus (insulin glargine) 15 units daily -Increased dose of GLP-1 Ozempic (semaglutide) from 0.25 to 0.5 mg weekly  -Continued metformin 500 mg daily.  -Extensively discussed pathophysiology of diabetes, recommended lifestyle interventions, dietary effects on blood sugar control.

## 2023-03-21 NOTE — Patient Instructions (Addendum)
It was nice to see you today!  Your goal blood sugar is 80-130 before eating and less than 180 after eating.  Medication Changes: CONTINUE metformin 500 mg daily  INCREASE DOSE of Ozempic (semaglutide) from 0.25 mg to 0.5 mg weekly  DISCONTINUE Lantus (insulin glargine)  Monitor blood sugars at home and keep a log (glucometer or piece of paper) to bring with you to your next visit.  Keep up the good work with diet and exercise. Aim for a diet full of vegetables, fruit and lean meats (chicken, [place], fish). Try to limit salt intake by eating fresh or frozen vegetables (instead of canned), rinse canned vegetables prior to cooking and do not add any additional salt to meals.

## 2023-03-21 NOTE — Progress Notes (Signed)
S:     Chief Complaint  Patient presents with   Medication Management    T2DM F/U   57 y.o. female who presents for diabetes evaluation, education, and management.  PMH is significant for T2DM, HTN.  Patient was referred and last seen by Primary Care Provider, Dr. Idalia Needle, on 01/11/23.  At last visit, patient was taking: Metformin 500 mg daily, and Lantus (insulin glargine) was decreased from 20 to 15 units daily.   Today, patient arrives in good spirits and presents without any assistance.   Current diabetes medications include: Lantus (insulin glargine) 15 units daily, Ozempic (semaglutide) 0.25 mg weekly, metformin 500 mg daily Current hypertension medications include: Carvedilol 3.125 mg BID  Patient reports adherence to taking all medications as prescribed.   Do you feel that your medications are working for you? yes Have you been experiencing any side effects to the medications prescribed? Yes. Slight nausea with Ozempic  Do you have any problems obtaining medications due to transportation or finances? no Insurance coverage: Medicaid  Patient denies hypoglycemic events.  Reported home fasting blood sugars: 90s-110s    Patient reported dietary habits: Reports her appetite has gotten smaller   Within the past 12 months, did you worry whether your food would run out before you got money to buy more? no Within the past 12 months, did the food you bought run out, and you didn't have money to get more? no   O:   Review of Systems  All other systems reviewed and are negative.   Physical Exam Constitutional:      Appearance: Normal appearance.  Pulmonary:     Effort: Pulmonary effort is normal.  Neurological:     Mental Status: She is alert.  Psychiatric:        Mood and Affect: Mood normal.        Behavior: Behavior normal.        Thought Content: Thought content normal.        Judgment: Judgment normal.      Lab Results  Component Value Date   HGBA1C 5.8  01/11/2023   Vitals:   03/21/23 1426  BP: 122/74  Pulse: 82  SpO2: 99%    Lipid Panel     Component Value Date/Time   CHOL 165 06/14/2021 1437   TRIG 91 06/14/2021 1437   HDL 46 06/14/2021 1437   CHOLHDL 3.6 06/14/2021 1437   CHOLHDL 5.1 07/17/2013 0923   VLDL 30 07/17/2013 0923   LDLCALC 102 (H) 06/14/2021 1437   LDLDIRECT 130 (H) 02/14/2012 1424    Clinical Atherosclerotic Cardiovascular Disease (ASCVD): No  The 10-year ASCVD risk score (Arnett DK, et al., 2019) is: 9.7%   Values used to calculate the score:     Age: 65 years     Sex: Female     Is Non-Hispanic African American: Yes     Diabetic: Yes     Tobacco smoker: No     Systolic Blood Pressure: 122 mmHg     Is BP treated: Yes     HDL Cholesterol: 46 mg/dL     Total Cholesterol: 165 mg/dL   Patient is participating in a Managed Medicaid Plan:  Yes   A/P: Diabetes longstanding. Patient is able to verbalize appropriate hypoglycemia management plan. Medication adherence appears good. Control is optimal due to blood sugars and recent A1c (5.8% 01/11/23) within goal. -Discontinued basal insulin Lantus (insulin glargine) 15 units daily -Increased dose of GLP-1 Ozempic (semaglutide) from 0.25 to  0.5 mg weekly  -Continued metformin 500 mg daily.  -Extensively discussed pathophysiology of diabetes, recommended lifestyle interventions, dietary effects on blood sugar control.  -Counseled on s/sx of and management of hypoglycemia.   Consider starting statin at next visit.  -Next A1c anticipated 04/11/23.   Written patient instructions provided. Patient verbalized understanding of treatment plan.  Total time in face to face counseling 20 minutes.    Follow-up:  Pharmacist July 2024. PCP will schedule 03/2023 Patient seen with Earl Gala PGY-1 Pharmacy Resident, Revonda Standard, PharmD Candidate and Valeda Malm, PharmD, PGY2 Pharmacy Resident.

## 2023-03-22 ENCOUNTER — Other Ambulatory Visit: Payer: Self-pay

## 2023-03-22 DIAGNOSIS — F419 Anxiety disorder, unspecified: Secondary | ICD-10-CM

## 2023-03-22 MED ORDER — DIAZEPAM 5 MG PO TABS: ORAL_TABLET | ORAL | 0 refills | Status: DC

## 2023-03-22 NOTE — Progress Notes (Signed)
Reviewed and agree with Dr Koval's plan.   

## 2023-04-05 ENCOUNTER — Other Ambulatory Visit: Payer: Self-pay

## 2023-04-05 MED ORDER — ACCU-CHEK SOFTCLIX LANCETS MISC: 12 refills | Status: DC

## 2023-04-05 MED ORDER — ACCU-CHEK GUIDE VI STRP: ORAL_STRIP | 3 refills | Status: DC

## 2023-04-05 MED ORDER — OMEPRAZOLE 20 MG PO CPDR: DELAYED_RELEASE_CAPSULE | ORAL | 0 refills | Status: DC

## 2023-04-16 ENCOUNTER — Ambulatory Visit: Payer: Medicaid Other | Admitting: Family Medicine

## 2023-04-30 ENCOUNTER — Other Ambulatory Visit: Payer: Self-pay | Admitting: Family Medicine

## 2023-05-09 ENCOUNTER — Other Ambulatory Visit: Payer: Self-pay | Admitting: Cardiology

## 2023-05-13 ENCOUNTER — Other Ambulatory Visit: Payer: Self-pay | Admitting: Family Medicine

## 2023-05-13 DIAGNOSIS — F32A Depression, unspecified: Secondary | ICD-10-CM

## 2023-05-16 ENCOUNTER — Encounter: Payer: Self-pay | Admitting: Family Medicine

## 2023-05-16 ENCOUNTER — Ambulatory Visit (INDEPENDENT_AMBULATORY_CARE_PROVIDER_SITE_OTHER): Payer: Medicaid Other | Admitting: Family Medicine

## 2023-05-16 VITALS — BP 114/90 | HR 94 | Wt 266.0 lb

## 2023-05-16 DIAGNOSIS — Z1159 Encounter for screening for other viral diseases: Secondary | ICD-10-CM

## 2023-05-16 DIAGNOSIS — E119 Type 2 diabetes mellitus without complications: Secondary | ICD-10-CM

## 2023-05-16 LAB — POCT GLYCOSYLATED HEMOGLOBIN (HGB A1C): HbA1c, POC (prediabetic range): 5.6 % — AB (ref 5.7–6.4)

## 2023-05-16 MED ORDER — SEMAGLUTIDE (1 MG/DOSE) 4 MG/3ML ~~LOC~~ SOPN
1.0000 mg | PEN_INJECTOR | SUBCUTANEOUS | 0 refills | Status: DC
Start: 2023-05-16 — End: 2023-07-25

## 2023-05-16 NOTE — Progress Notes (Signed)
    SUBJECTIVE:   CHIEF COMPLAINT / HPI:   Patient presents for diabetes follow up. Has been managed by myself and pharmacy team. Most recent regimen includes metformin 500 mg daily, Ozempic 0.5 mg weekly. Lantus was recently discontinued   Has been feeling well, has been eating a little more carbs such as bagels. No fried foods. No sweets   Has been getting some exercise in by walking  Feels Ozempic has been causing some constipation   Would like to check blood work   PERTINENT  PMH / PSH: Reviewed   OBJECTIVE:   BP (!) 114/90   Pulse 94   Wt 266 lb (120.7 kg)   LMP 09/20/2012   SpO2 96%   BMI 44.26 kg/m    Physical exam General: well appearing, NAD Cardiovascular: RRR, no murmurs Lungs: CTAB. Normal WOB Abdomen: soft, non-distended Skin: warm, dry. No edema  ASSESSMENT/PLAN:   Type 2 diabetes mellitus without complications (HCC) A1c 5.6 down from 5.8. Currently on Metformin 500mg  and Ozempic .5mg  weekly. Tolerating both well though with some constipation. Will increase Ozempic to 1mg  weekly. Recommended increasing fiber intake and using Miralax to help with constipation. Next follow up in 3 months.    Health maintenance - Hep C screen - BMP, lipid panel   Cora Collum, DO [facility]

## 2023-05-16 NOTE — Patient Instructions (Addendum)
It was great seeing you today!  Your A1c is 5.6 great job in keeping your sugars down! We will increase your ozemic to 1mg  weekly and get blood work today. I will call you if anything is abnormal or will send a mychart message if normal.  IT was so great caring for you!!   Schedule to return in about a month to meet new PCP and take care of health maintenance items, but if you need to be seen earlier than that for any new issues we're happy to fit you in, just give [place] a call!   Feel free to call with any questions or concerns at any time, at [phone].   Take care,  Dr. Cora Collum [facility]

## 2023-05-17 ENCOUNTER — Other Ambulatory Visit: Payer: Self-pay | Admitting: Family Medicine

## 2023-05-17 LAB — BASIC METABOLIC PANEL
BUN/Creatinine Ratio: 20 (ref 9–23)
BUN: 19 mg/dL (ref 6–24)
CO2: 20 mmol/L (ref 20–29)
Calcium: 9.5 mg/dL (ref 8.7–10.2)
Chloride: 98 mmol/L (ref 96–106)
Creatinine, Ser: 0.96 mg/dL (ref 0.57–1.00)
Glucose: 104 mg/dL — ABNORMAL HIGH (ref 70–99)
Potassium: 4.3 mmol/L (ref 3.5–5.2)
Sodium: 136 mmol/L (ref 134–144)
eGFR: 70 mL/min/{1.73_m2} (ref >59)

## 2023-05-17 LAB — HCV AB W REFLEX TO QUANT PCR: HCV Ab: NONREACTIVE

## 2023-05-17 LAB — LIPID PANEL
Chol/HDL Ratio: 3.6 ratio (ref 0.0–4.4)
Cholesterol, Total: 185 mg/dL (ref 100–199)
HDL: 51 mg/dL (ref >39)
LDL Chol Calc (NIH): 117 mg/dL — ABNORMAL HIGH (ref 0–99)
Triglycerides: 95 mg/dL (ref 0–149)
VLDL Cholesterol Cal: 17 mg/dL (ref 5–40)

## 2023-05-17 LAB — HCV INTERPRETATION

## 2023-05-17 NOTE — Assessment & Plan Note (Signed)
A1c 5.6 down from 5.8. Currently on Metformin 500mg  and Ozempic .5mg  weekly. Tolerating both well though with some constipation. Will increase Ozempic to 1mg  weekly. Recommended increasing fiber intake and using Miralax to help with constipation. Next follow up in 3 months.

## 2023-06-18 ENCOUNTER — Other Ambulatory Visit: Payer: Self-pay | Admitting: Family Medicine

## 2023-06-18 ENCOUNTER — Telehealth: Payer: Self-pay

## 2023-06-18 DIAGNOSIS — Z1231 Encounter for screening mammogram for malignant neoplasm of breast: Secondary | ICD-10-CM

## 2023-06-18 NOTE — Telephone Encounter (Signed)
Pt called to cancel her appointment for tomorrow 7/24 with Dr. Tamela Oddi. Pt declined rescheduling at this time stating she has had a death in her family and will call back.

## 2023-06-19 ENCOUNTER — Inpatient Hospital Stay: Payer: Medicaid Other | Admitting: Obstetrics & Gynecology

## 2023-06-19 DIAGNOSIS — Z8542 Personal history of malignant neoplasm of other parts of uterus: Secondary | ICD-10-CM

## 2023-06-21 IMAGING — MR MR HEAD W/O CM
11 series · 48 of 48 positions shown · non-contrast
Comparison: None available.

CLINICAL DATA: Initial evaluation for acute dizziness.

EXAM:
MRI HEAD WITHOUT CONTRAST
TECHNIQUE: Multiplanar, multiecho pulse sequences of the brain and surrounding
structures were obtained without intravenous contrast.

[Series 5: DWI · axial · 3.0mm · 1.36mm/px · z∈[-34,+109]mm · 8 of 100 slices shown (1 of 2)]
[im 1/100]
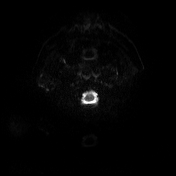
[im 15/100]
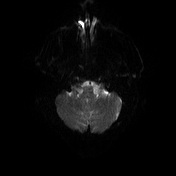
[im 29/100]
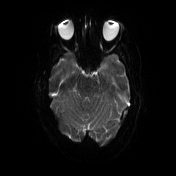
[im 43/100]
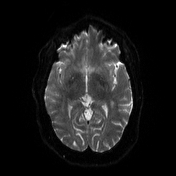
[im 57/100]
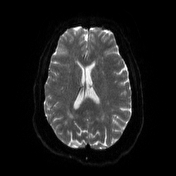
[im 71/100]
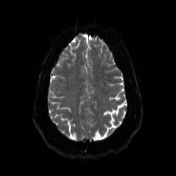
[im 85/100]
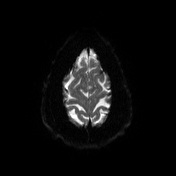
[im 100/100]
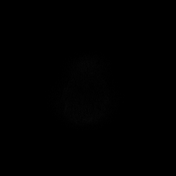

[Series 6: DWI · axial · 3.0mm · 1.36mm/px · z∈[-34,+109]mm · 4 of 50 slices shown (2 of 2)]
[im 1/50]
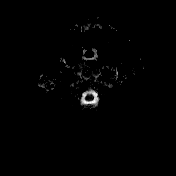
[im 17/50]
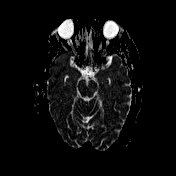
[im 33/50]
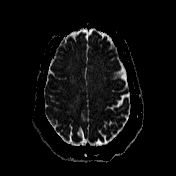
[im 50/50]
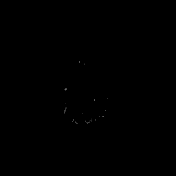

[Series 7: T1 · sagittal · 5.0mm · 0.75mm/px · 2 of 24 slices shown (1 of 2)]
[im 1/24]
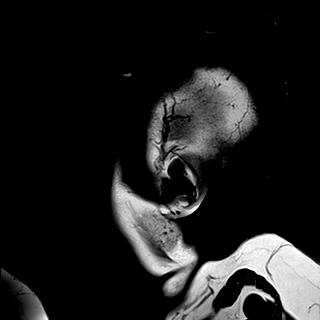
[im 24/24]
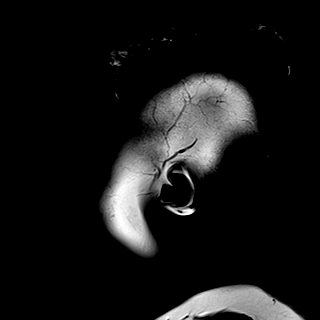

[Series 8: T2 · axial · 5.0mm · 0.62mm/px · z∈[-41,+118]mm · 2 of 26 slices shown (1 of 2)]
[im 1/26]
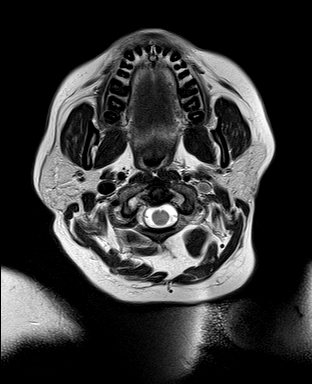
[im 26/26]
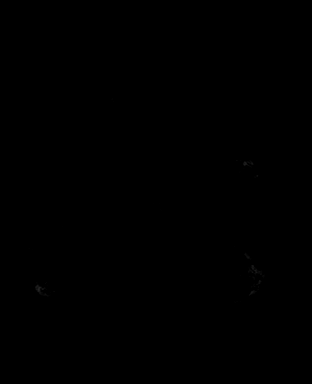

[Series 9: swi_images · axial · 3.0mm · 0.75mm/px · z∈[-36,+113]mm · 4 of 52 slices shown]
[im 1/52]
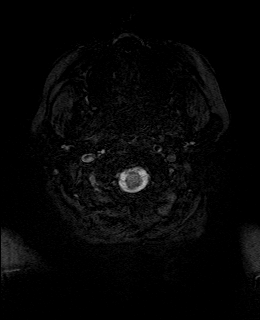
[im 18/52]
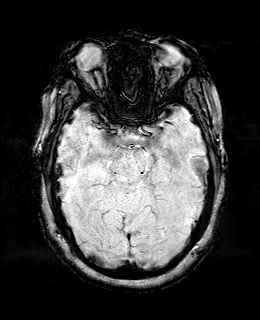
[im 35/52]
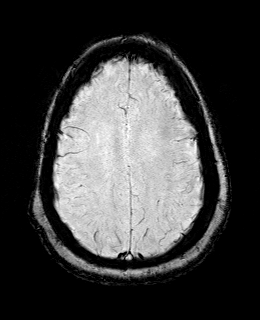
[im 52/52]
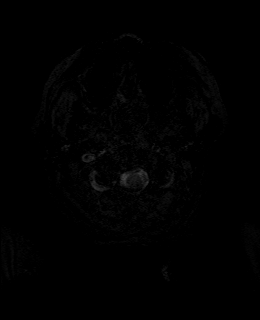

[Series 10: mip_images(sw) · axial · 24.0mm · 0.75mm/px · z∈[-26,+103]mm · 4 of 45 slices shown]
[im 1/45]
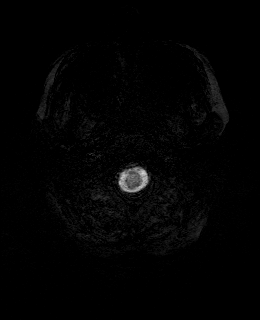
[im 15/45]
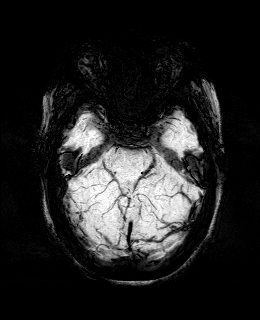
[im 30/45]
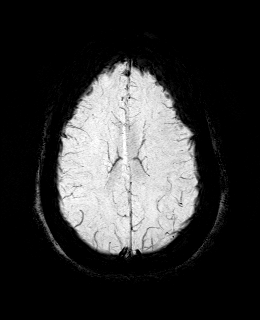
[im 45/45]
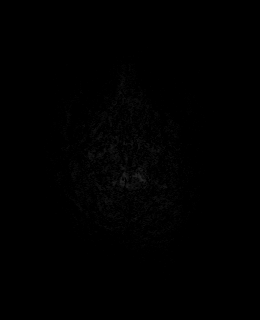

[Series 11: FLAIR · axial · 3.0mm · 0.75mm/px · z∈[-36,+113]mm · 4 of 52 slices shown]
[im 1/52]
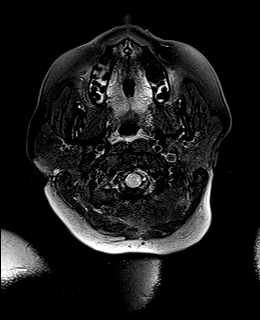
[im 18/52]
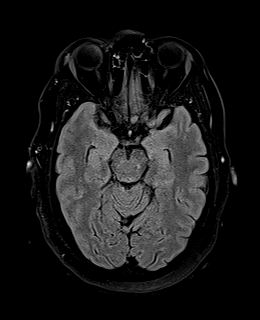
[im 35/52]
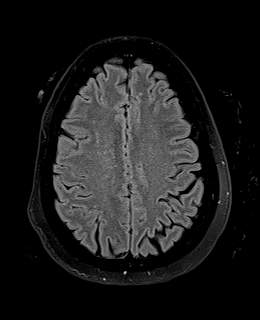
[im 52/52]
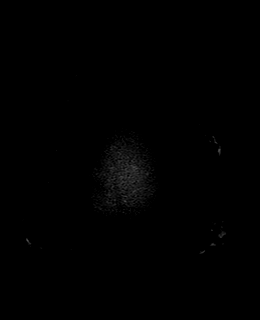

[Series 12: T1 · axial · 1.0mm · 0.94mm/px · z∈[-31,+108]mm · 11 of 144 slices shown (2 of 2)]
[im 1/144]
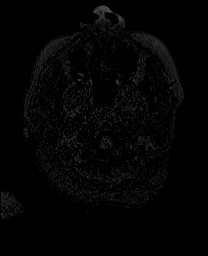
[im 15/144]
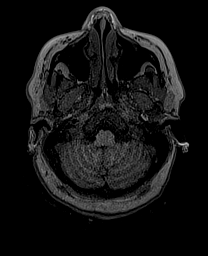
[im 29/144]
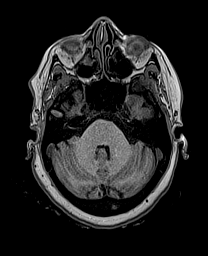
[im 43/144]
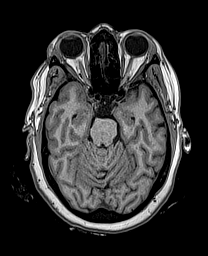
[im 58/144]
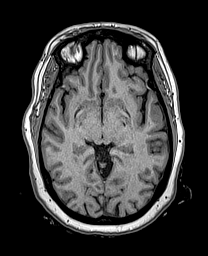
[im 72/144]
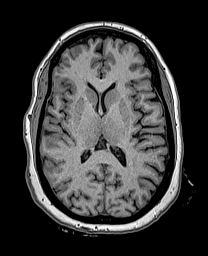
[im 86/144]
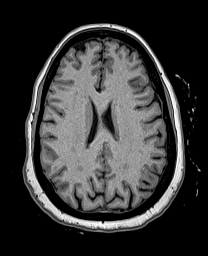
[im 101/144]
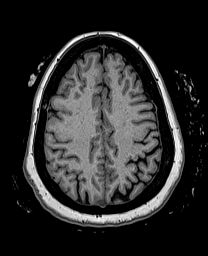
[im 115/144]
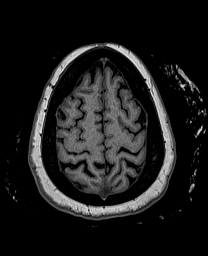
[im 129/144]
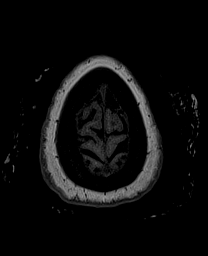
[im 144/144]
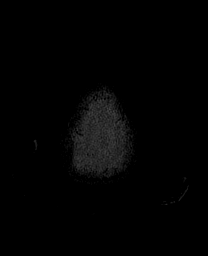

[Series 13: cor dwi_tracew · coronal · 5.0mm · 1.53mm/px · 4 of 54 slices shown]
[im 1/54]
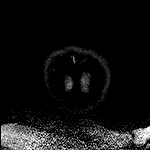
[im 18/54]
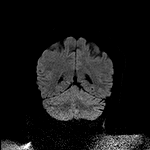
[im 36/54]
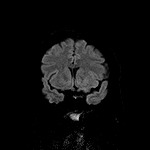
[im 54/54]
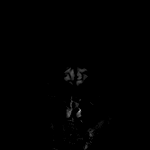

[Series 14: cor dwi_adc · coronal · 5.0mm · 1.53mm/px · 2 of 27 slices shown]
[im 1/27]
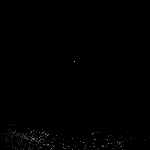
[im 27/27]
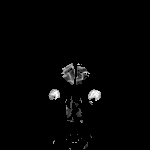

[Series 15: T2 · coronal · 5.0mm · 0.57mm/px · 3 of 35 slices shown (2 of 2)]
[im 1/35]
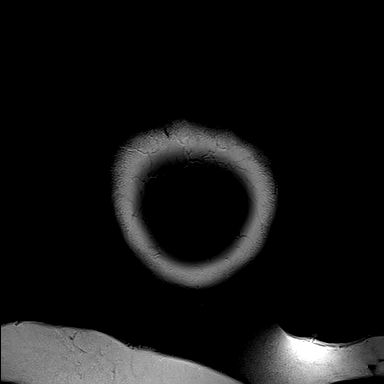
[im 18/35]
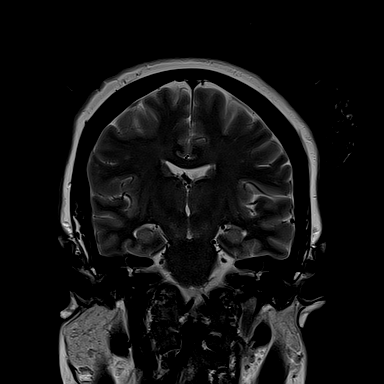
[im 35/35]
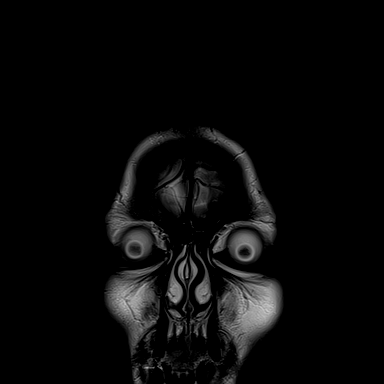

[48 of 48 positions shown; findings below may reference images not displayed]

FINDINGS: Brain: Cerebral volume within normal limits. Scattered patchy
T2/FLAIR hyperintensity present within the periventricular deep
white matter both cerebral hemispheres, most like related chronic
microvascular ischemic disease, mild to moderate in nature. Mild
patchy involvement of the pons noted.

No abnormal foci of restricted diffusion to suggest acute or
subacute ischemia. Gray-white matter differentiation maintained. No
encephalomalacia to suggest chronic cortical infarction. No evidence
for acute or chronic intracranial hemorrhage.

No mass lesion, midline shift or mass effect. No hydrocephalus or
extra-axial fluid collection. Pituitary gland suprasellar region
within normal limits. Midline structures intact.

Vascular: Major intracranial vascular flow voids are maintained.
Dominant right vertebral artery noted.

Skull and upper cervical spine: Basilar junction within normal
limits. Diffusely decreased T1 signal intensity seen throughout the
visualized bone marrow. Small benign hemangioma noted within the C2
vertebral body. No worrisome osseous lesions. No scalp soft tissue
abnormality.

Sinuses/Orbits: Globes and orbital soft tissues demonstrate no acute
finding. Mild mucosal thickening noted within the ethmoidal air
cells. Paranasal sinuses are otherwise clear. No mastoid effusion.
Inner ear structures grossly normal.

Other: 1 cm T2 hyperintense polypoid cystic lesion present at the
lateral margin of the right orbit (series 8, image 8), nonspecific,
but likely benign.
IMPRESSION: 1. No acute intracranial abnormality.
2. Mild-to-moderate chronic microvascular ischemic disease for age.
3. 1 cm polypoid cystic lesion at the lateral margin of the right
orbit, nonspecific, but likely benign. Correlation with physical
exam recommended.

## 2023-06-26 ENCOUNTER — Ambulatory Visit: Payer: Medicaid Other

## 2023-07-03 ENCOUNTER — Other Ambulatory Visit: Payer: Self-pay | Admitting: *Deleted

## 2023-07-03 DIAGNOSIS — F32A Anxiety disorder, unspecified: Secondary | ICD-10-CM

## 2023-07-03 MED ORDER — DIAZEPAM 5 MG PO TABS: ORAL_TABLET | ORAL | 0 refills | Status: DC

## 2023-07-11 ENCOUNTER — Ambulatory Visit
Admission: RE | Admit: 2023-07-11 | Discharge: 2023-07-11 | Disposition: A | Discharge status: Home / Self Care | Payer: Medicaid Other | Source: Ambulatory Visit | Attending: Family Medicine | Admitting: Family Medicine

## 2023-07-11 DIAGNOSIS — Z1231 Encounter for screening mammogram for malignant neoplasm of breast: Secondary | ICD-10-CM

## 2023-07-25 ENCOUNTER — Ambulatory Visit (INDEPENDENT_AMBULATORY_CARE_PROVIDER_SITE_OTHER): Payer: Medicaid Other | Admitting: Pharmacist

## 2023-07-25 ENCOUNTER — Encounter: Payer: Self-pay | Admitting: Pharmacist

## 2023-07-25 VITALS — BP 108/72 | HR 99 | Ht 66.0 in | Wt 273.2 lb

## 2023-07-25 DIAGNOSIS — Z794 Long term (current) use of insulin: Secondary | ICD-10-CM | POA: Diagnosis not present

## 2023-07-25 DIAGNOSIS — E119 Type 2 diabetes mellitus without complications: Secondary | ICD-10-CM

## 2023-07-25 MED ORDER — ATORVASTATIN CALCIUM 40 MG PO TABS
40.0000 mg | ORAL_TABLET | Freq: Every day | ORAL | 11 refills | Status: DC
Start: 2023-07-25 — End: 2023-11-14

## 2023-07-25 MED ORDER — OZEMPIC (0.25 OR 0.5 MG/DOSE) 2 MG/3ML ~~LOC~~ SOPN: 0.5000 mg | PEN_INJECTOR | SUBCUTANEOUS | 11 refills | Status: DC

## 2023-07-25 NOTE — Progress Notes (Signed)
S:     Chief Complaint  Patient presents with   Medication Management    Diabetes   57 y.o. female who presents for diabetes evaluation, education, and management. Patient arrives in good spirits and presents without any assistance.  Patient was referred and last seen by Primary Care Provider, Dr. Idalia Needle, on 05/16/23.    PMH is significant for T2DM, HTN.  At last visit, discontinued basal insulin Lantus (insulin glargine) 15 units daily, increased dose of GLP-1 Ozempic (semaglutide) from 0.25 to 0.5 mg weekly , and continued metformin 500 mg daily.   Current diabetes medications include: metformin XR 500 mg once daily, Ozempic (semaglutide) 0.5 mg once weekly Current hypertension medications include: carvedilol 3.125 mg 1 tablet in the morning and 2 tablets at bedtime Current hyperlipidemia medications include: none  Patient reports adherence to taking all medications as prescribed.   Do you feel that your medications are working for you? yes Have you been experiencing any side effects to the medications prescribed? Yes, patient reports GI side effects on the days that Ozempic is administered  Insurance coverage: Medicaid  Patient denies hypoglycemic events.  Reported home fasting blood sugars: 90-120  Patient reports short-term goal weight of 260 lbs.   O:   Review of Systems  All other systems reviewed and are negative.   Physical Exam Constitutional:      Appearance: Normal appearance.  Pulmonary:     Effort: Pulmonary effort is normal.  Neurological:     Mental Status: She is alert.  Psychiatric:        Mood and Affect: Mood normal.        Behavior: Behavior normal.        Thought Content: Thought content normal.        Judgment: Judgment normal.      Lab Results  Component Value Date   HGBA1C 5.6 (A) 05/16/2023   Vitals:   07/25/23 1423 07/25/23 1426  BP: (!) 130/91 108/72  Pulse: 99   SpO2: 97%     Lipid Panel     Component Value Date/Time    CHOL 185 05/16/2023 1651   TRIG 95 05/16/2023 1651   HDL 51 05/16/2023 1651   CHOLHDL 3.6 05/16/2023 1651   CHOLHDL 5.1 07/17/2013 0923   VLDL 30 07/17/2013 0923   LDLCALC 117 (H) 05/16/2023 1651   LDLDIRECT 130 (H) 02/14/2012 1424    Clinical Atherosclerotic Cardiovascular Disease (ASCVD): No  The 10-year ASCVD risk score (Arnett DK, et al., 2019) is: 6.5%   Values used to calculate the score:     Age: 35 years     Sex: Female     Is Non-Hispanic African American: Yes     Diabetic: Yes     Tobacco smoker: No     Systolic Blood Pressure: 108 mmHg     Is BP treated: Yes     HDL Cholesterol: 51 mg/dL     Total Cholesterol: 185 mg/dL   Patient is participating in a Managed Medicaid Plan:  Yes   A/P: Diabetes longstanding. Patient reports GI side effects following Ozempic administration. Patient is able to verbalize appropriate hypoglycemia management plan. Medication adherence appears good. Control is optimal due to blood sugars and recent A1c (5.6% 05/16/23). - Decreased Ozempic (semaglutide) to 5 clicks less than 0.5 mg once weekly in attempt to improve GI tolerance while maintaining efficacy.  - Continued metformin XR 500 mg once daily. - Patient educated on purpose, proper use, and potential adverse  effects.   - Extensively discussed pathophysiology of diabetes, recommended lifestyle interventions, dietary effects on blood sugar control.  - Counseled on s/sx of and management of hypoglycemia.   ASCVD risk - secondary prevention in patient with diabetes. Last LDL is 117 not at goal of <16 mg/dL.  - Initiated atorvastatin 40 mg once daily.  Written patient instructions provided. Patient verbalized understanding of treatment plan.  Total time in face to face counseling 32 minutes.    Follow-up:  Pharmacist PRN PCP clinic visit in PRN Patient seen with Rickey Primus, PharmD Candidate and Andee Poles, PharmD Candidate.

## 2023-07-25 NOTE — Assessment & Plan Note (Addendum)
Diabetes longstanding. Patient reports GI side effects following Ozempic administration. Patient is able to verbalize appropriate hypoglycemia management plan. Medication adherence appears good. Control is optimal due to blood sugars and recent A1c (5.6% 05/16/23). - Decreased Ozempic (semaglutide) to 5 clicks less than 0.5 mg once weekly in attempt to improve GI tolerance while maintaining efficacy.  - Continued metformin XR 500 mg once daily. - Patient educated on purpose, proper use, and potential adverse effects.   - Extensively discussed pathophysiology of diabetes, recommended lifestyle interventions, dietary effects on blood sugar control.  - Counseled on s/sx of and management of hypoglycemia.   Diabetes longstanding. Patient reports GI side effects following Ozempic administration. Patient is able to verbalize appropriate hypoglycemia management plan. Medication adherence appears good. Control is optimal due to blood sugars and recent A1c (5.6% 05/16/23). - Decreased Ozempic (semaglutide) to 5 clicks less than 0.5 mg once weekly in attempt to improve GI tolerance while maintaining efficacy.  - Continued metformin XR 500 mg once daily. - Patient educated on purpose, proper use, and potential adverse effects.   - Extensively discussed pathophysiology of diabetes, recommended lifestyle interventions, dietary effects on blood sugar control.  - Counseled on s/sx of and management of hypoglycemia.   ASCVD risk - secondary prevention in patient with diabetes. Last LDL is 117 not at goal of <73 mg/dL.  - Initiated atorvastatin 40 mg once daily.

## 2023-07-25 NOTE — Patient Instructions (Addendum)
It was nice to see you today!  Your goal blood sugar is 80-130 before eating and less than 180 after eating.  Medication Changes:  Start atorvastatin 40 mg once daily.  Decrease Ozempic dose by 5 clicks.  Continue all other medication the same.   Monitor blood sugars at home and keep a log (glucometer or piece of paper) to bring with you to your next visit.  Keep up the good work with diet and exercise. Aim for a diet full of vegetables, fruit and lean meats (chicken, [place], fish). Try to limit salt intake by eating fresh or frozen vegetables (instead of canned), rinse canned vegetables prior to cooking and do not add any additional salt to meals.

## 2023-07-26 NOTE — Progress Notes (Signed)
Reviewed and agree with Dr Koval's plan.   

## 2023-08-07 ENCOUNTER — Encounter: Payer: Self-pay | Admitting: Cardiology

## 2023-08-14 ENCOUNTER — Other Ambulatory Visit: Payer: Self-pay | Admitting: Cardiology

## 2023-08-14 ENCOUNTER — Telehealth: Payer: Self-pay | Admitting: Cardiology

## 2023-08-14 MED ORDER — CARVEDILOL 3.125 MG PO TABS: ORAL_TABLET | ORAL | 0 refills | Status: DC

## 2023-08-14 NOTE — Telephone Encounter (Signed)
Pt's medication was sent to pt's pharmacy as requested. Confirmation received.  °

## 2023-08-14 NOTE — Telephone Encounter (Signed)
*  STAT* If patient is at the pharmacy, call can be transferred to refill team.   1. Which medications need to be refilled? (please list name of each medication and dose if known) carvedilol (COREG) 3.125 MG tablet  2. Which pharmacy/location (including street and city if local pharmacy) is medication to be sent to? [id number] - [place], [place] - [address] AT [facility] OF [address]  3. Do they need a 30 day or 90 day supply?  90 day supply

## 2023-08-15 ENCOUNTER — Telehealth: Payer: Self-pay | Admitting: Cardiology

## 2023-08-15 NOTE — Telephone Encounter (Signed)
Pt c/o medication issue:  1. Name of Medication:   carvedilol (COREG) 3.125 MG tablet   2. How are you currently taking this medication (dosage and times per day)?   As prescribed  3. Are you having a reaction (difficulty breathing--STAT)?   No  4. What is your medication issue?   Patient stated her prescription for this medication was only refilled for 21 days and she is concerned she will run out of this medication.

## 2023-08-15 NOTE — Telephone Encounter (Signed)
Spoke with patient and she wanted to know why she only received 45 tablets for her carvedilol. I did inform her that the reason she is not getting a full refill on medication is because she needs to be seen. Last appointment was 5/23. Her appointment for 9/27 has been canceled. Appointment offered by scheduling team and she stated that didn't work for her. I did offer I can schedule her with APP and she stated she did not want to see anyone else and she is already on cancellation list. She got upset and stated she will not keep repeating herself and id not want to talk to me. I also told her if we can get her an appointment I can send enough medication to last until appointment and she told me to have a blessed day.

## 2023-08-16 ENCOUNTER — Telehealth: Payer: Self-pay | Admitting: Cardiology

## 2023-08-16 ENCOUNTER — Other Ambulatory Visit: Payer: Self-pay | Admitting: Cardiology

## 2023-08-16 DIAGNOSIS — I10 Essential (primary) hypertension: Secondary | ICD-10-CM

## 2023-08-16 MED ORDER — CARVEDILOL 3.125 MG PO TABS
ORAL_TABLET | ORAL | 0 refills | Status: DC
Start: 2023-08-30 — End: 2023-10-23

## 2023-08-16 NOTE — Telephone Encounter (Signed)
Spoke with patient and she has an appointment scheduled with provider. Will send rx until appointment.

## 2023-08-16 NOTE — Telephone Encounter (Signed)
Pt c/o medication issue:  1. Name of Medication:   carvedilol (COREG) 3.125 MG tablet    2. How are you currently taking this medication (dosage and times per day)?   TAKE 1 TABLET BY MOUTH EVERY MORNING AND 2 TABLETS AT BEDTIME    3. Are you having a reaction (difficulty breathing--STAT)? No  4. What is your medication issue? Pt states she trying to get 90 day supply for this medication and she only got 45. She states would like to get the full 90 now that she is scheduled. Please advise

## 2023-08-21 ENCOUNTER — Other Ambulatory Visit: Payer: Self-pay | Admitting: Family Medicine

## 2023-08-21 DIAGNOSIS — F419 Anxiety disorder, unspecified: Secondary | ICD-10-CM

## 2023-08-23 ENCOUNTER — Ambulatory Visit: Payer: Medicaid Other | Admitting: Cardiology

## 2023-08-23 NOTE — Telephone Encounter (Signed)
LMOVM informing pt to cal back and schedule appt. Deontrey Massi Bruna Potter, CMA

## 2023-08-26 ENCOUNTER — Other Ambulatory Visit: Payer: Self-pay | Admitting: Family Medicine

## 2023-08-26 DIAGNOSIS — F419 Anxiety disorder, unspecified: Secondary | ICD-10-CM

## 2023-08-26 MED ORDER — DIAZEPAM 5 MG PO TABS
ORAL_TABLET | ORAL | 0 refills | Status: DC
Start: 2023-08-26 — End: 2023-10-30

## 2023-08-26 NOTE — Telephone Encounter (Signed)
Patient returns call to nurse line. She has scheduled appointment with PCP on 09/26/2023.  She is requesting prescription refill to last until appointment.   Please advise.   Veronda Prude, RN

## 2023-08-26 NOTE — Progress Notes (Signed)
Pt has appointment scheduled for 1 month. Will refill one month supply of medication to last up to appt

## 2023-09-23 IMAGING — DX DG CHEST 1V PORT
1 series · 1 of 1 positions shown · non-contrast
Comparison: 04/23/2021

CLINICAL DATA: Hyperglycemia

EXAM:
PORTABLE CHEST 1 VIEW

[chest ap]
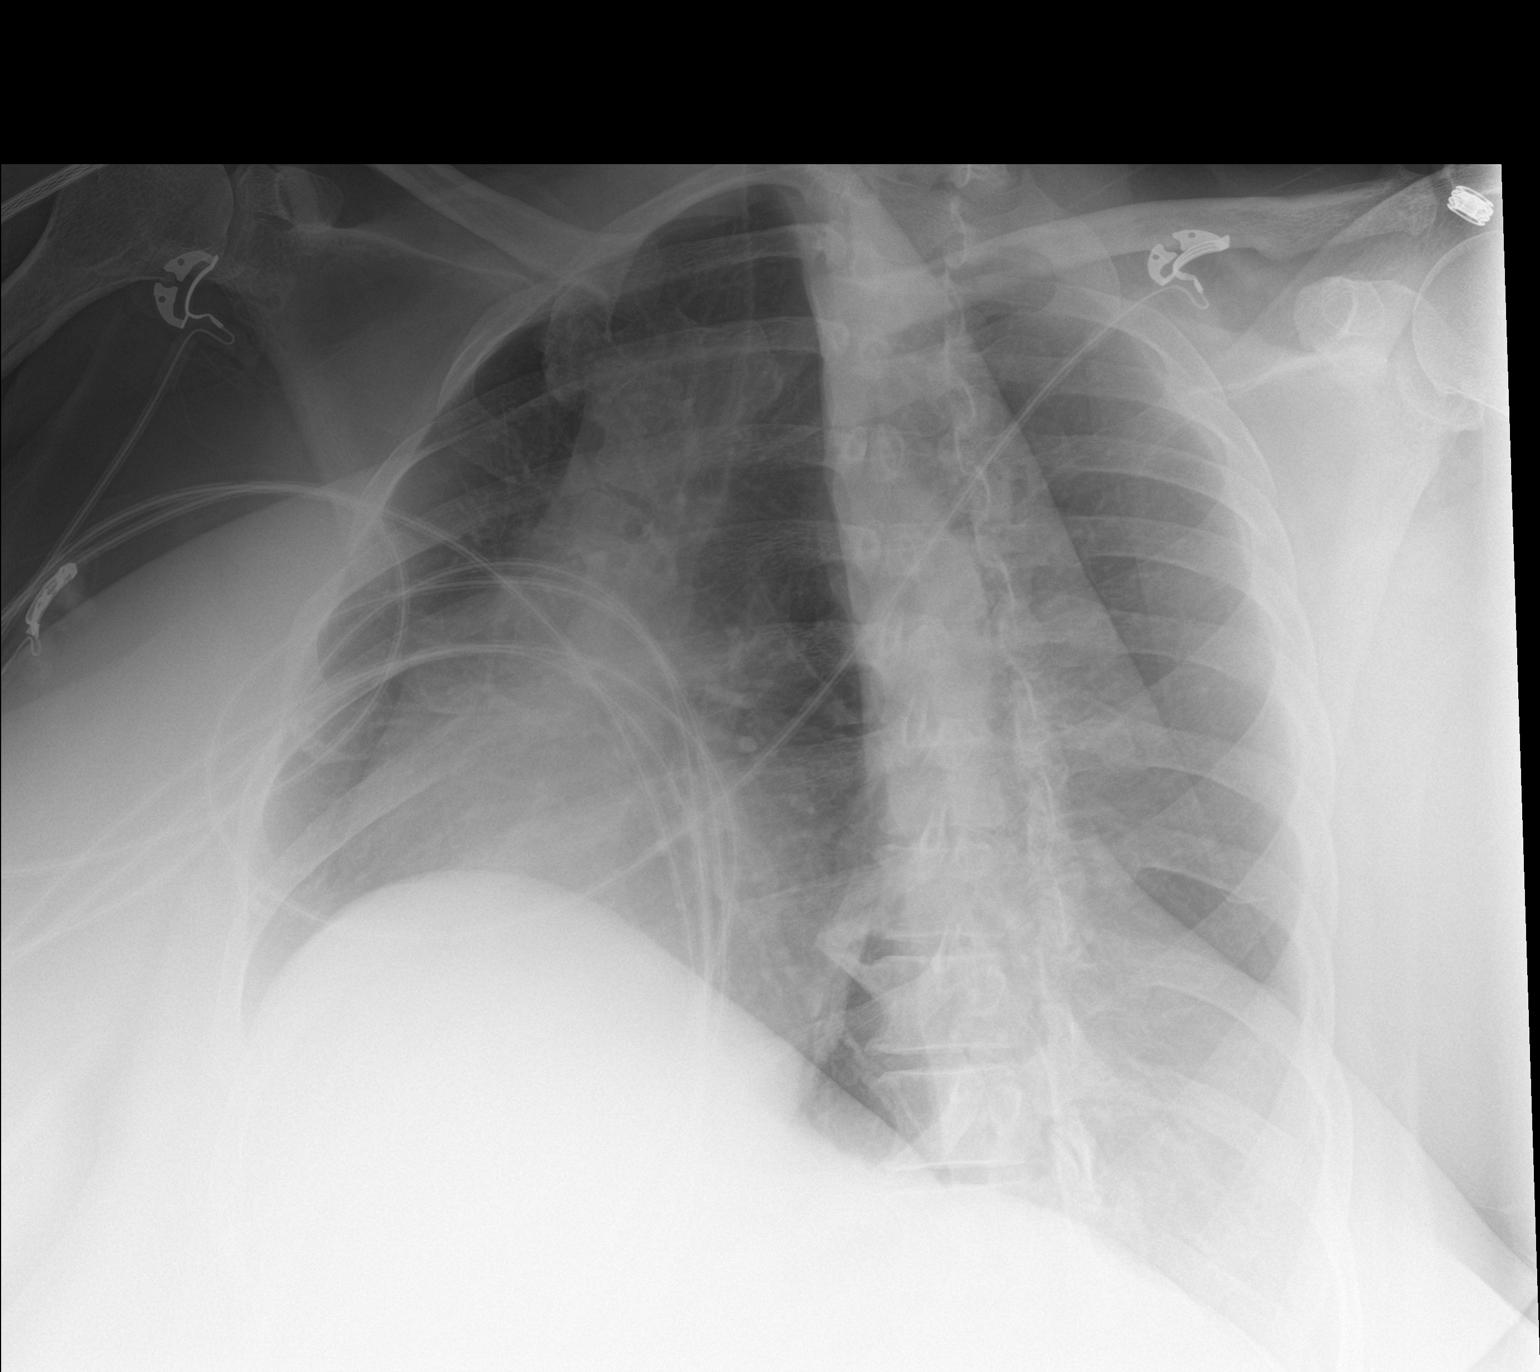

[1 of 1 positions shown; findings below may reference images not displayed]

FINDINGS: Patient is rotated. Lungs are clear. No pleural effusion or
pneumothorax.

Heart is normal in size.
IMPRESSION: No evidence of acute cardiopulmonary disease.

## 2023-09-24 IMAGING — US US RENAL
1 series · 15 of 25 positions shown · non-contrast
Comparison: None.

CLINICAL DATA: Acute kidney injury

EXAM:
RENAL / URINARY TRACT ULTRASOUND COMPLETE

[Series 1: us renal mc & wl · 15 of 38 slices shown]
[im 1/38]
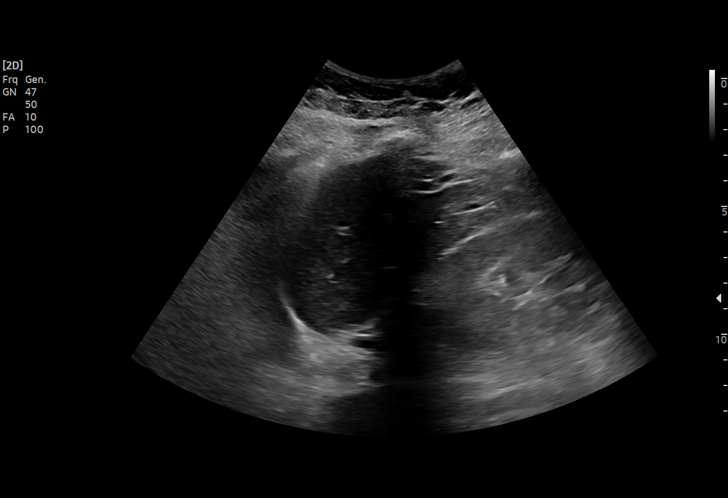
[im 4/38]
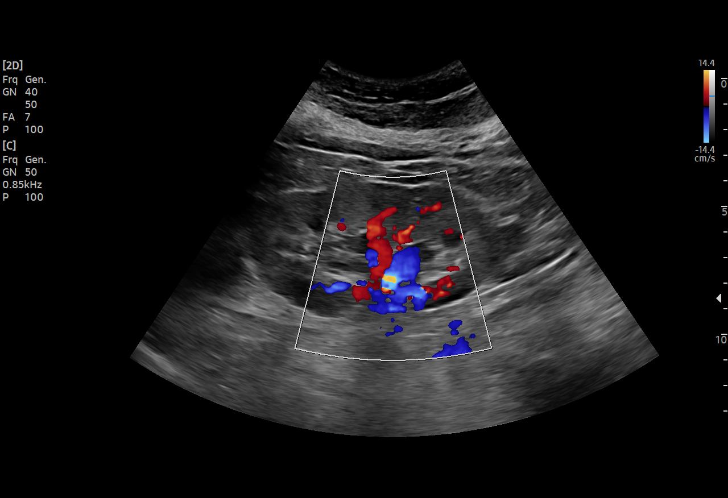
[im 7/38]
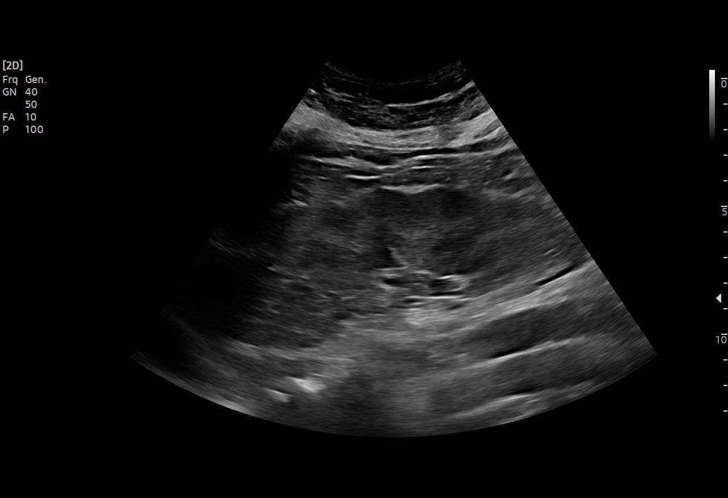
[im 8/38]
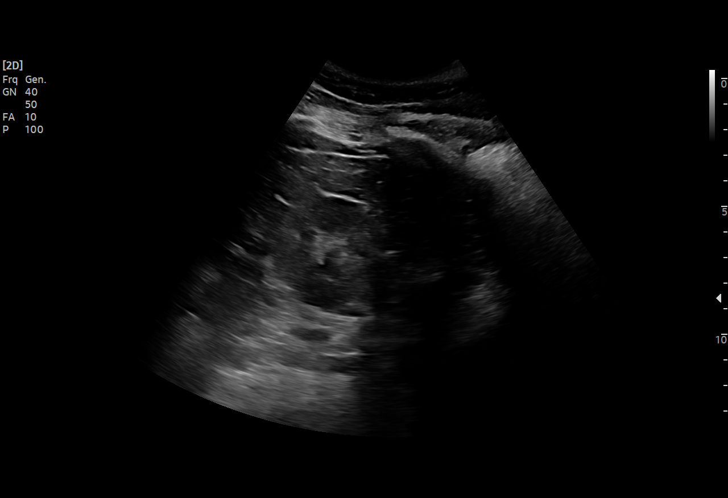
[im 11/38]
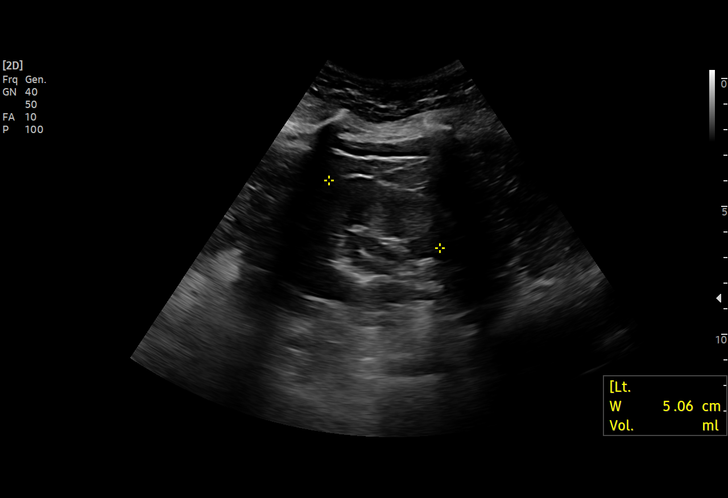
[im 14/38]
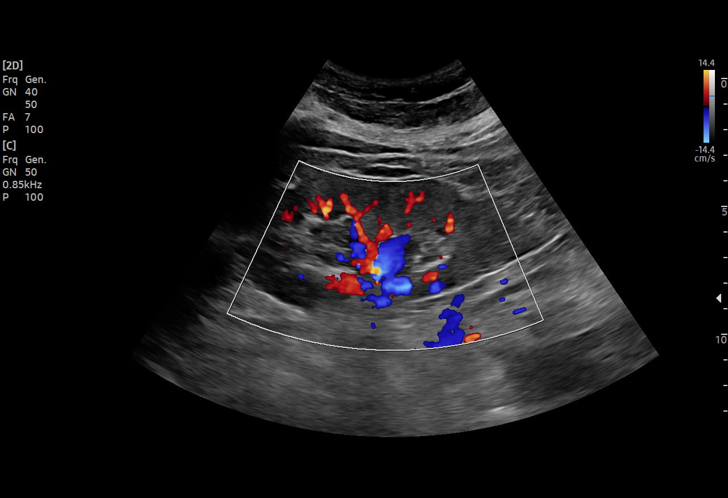
[im 16/38]
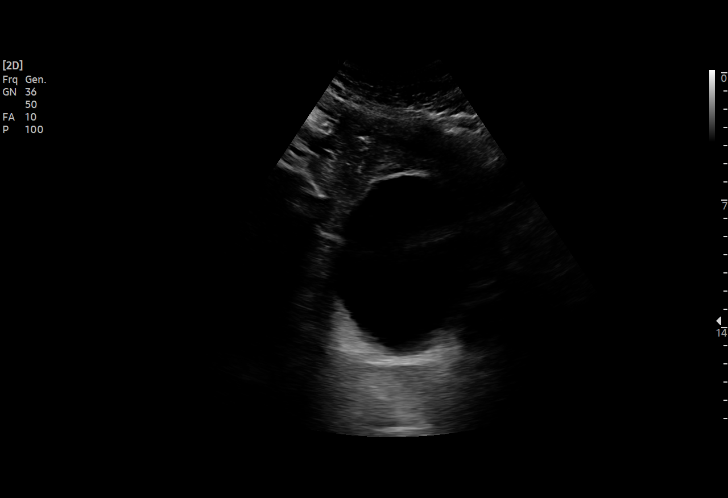
[im 19/38]
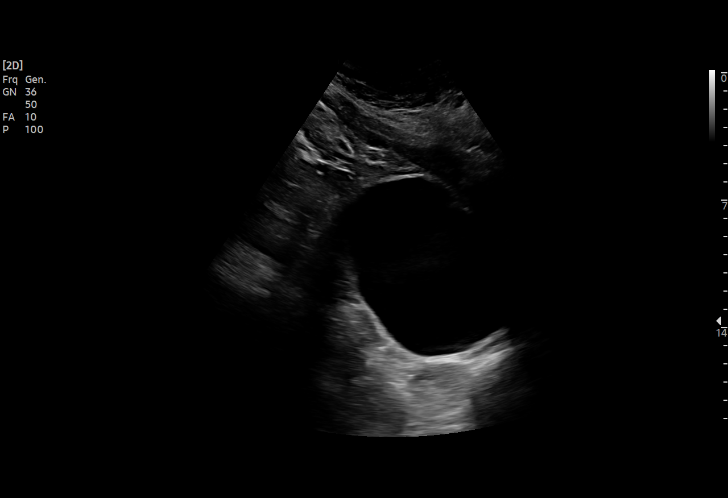
[im 22/38]
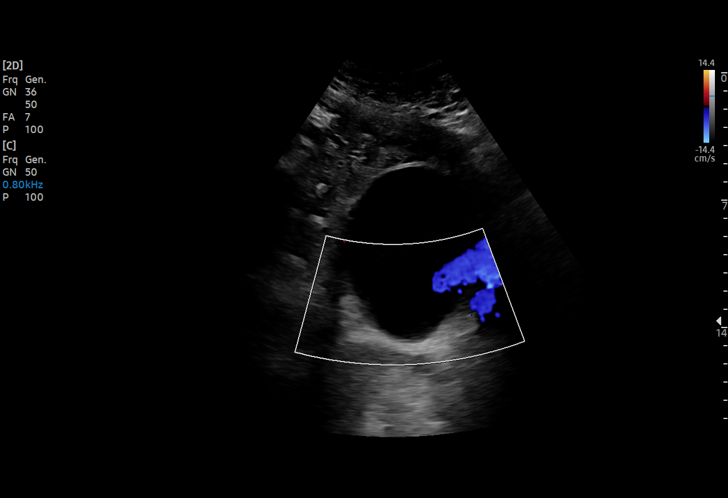
[im 24/38]
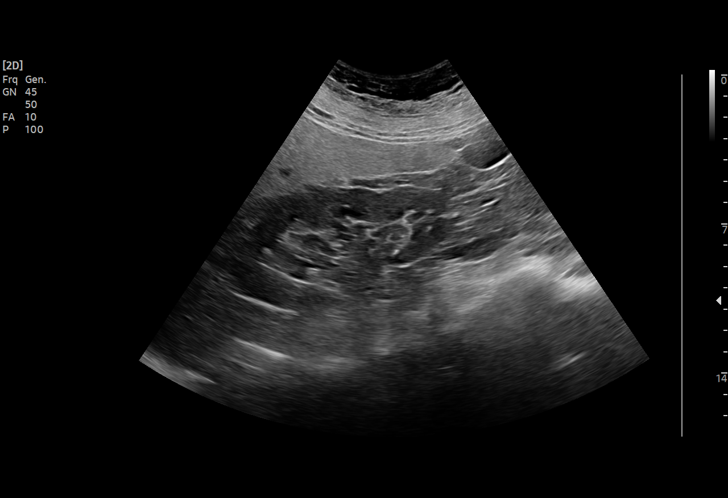
[im 27/38]
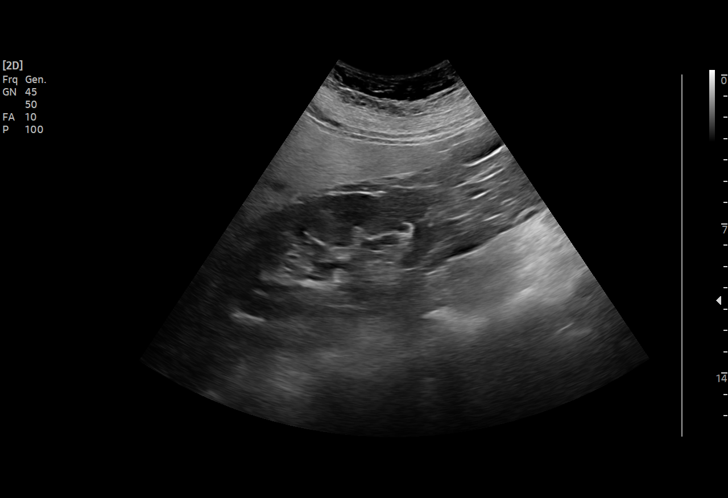
[im 30/38]
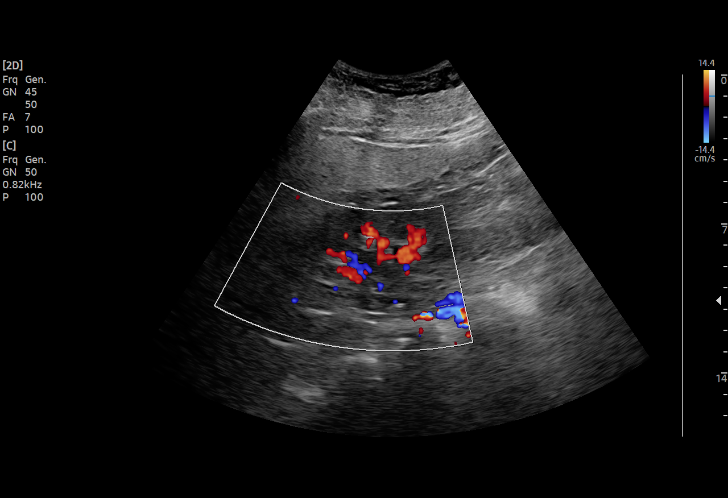
[im 31/38]
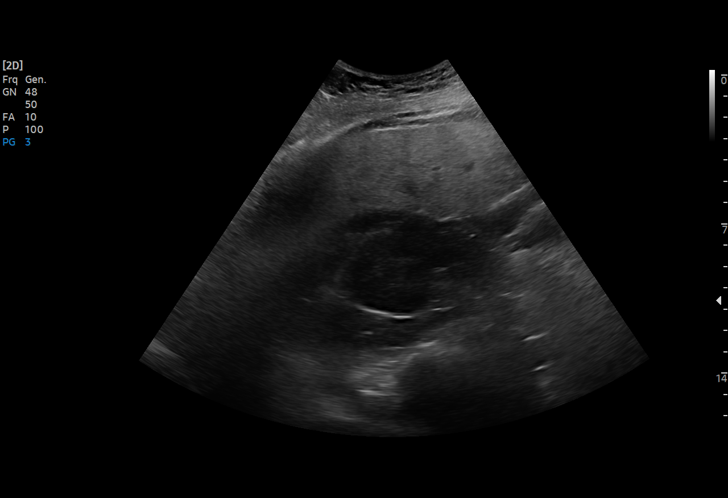
[im 34/38]
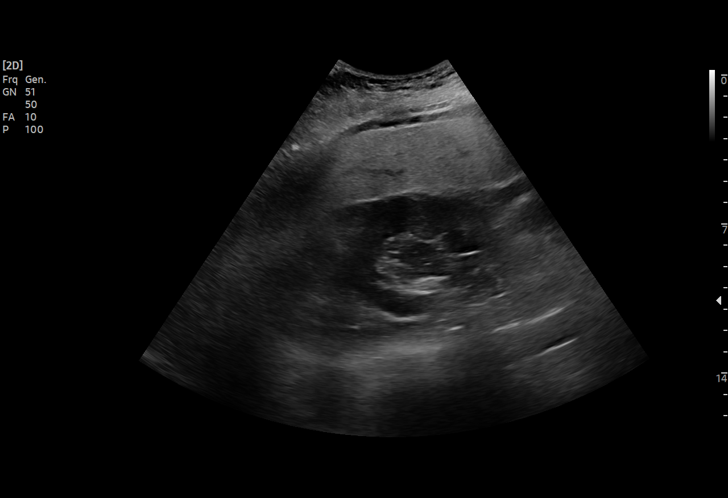
[im 38/38]
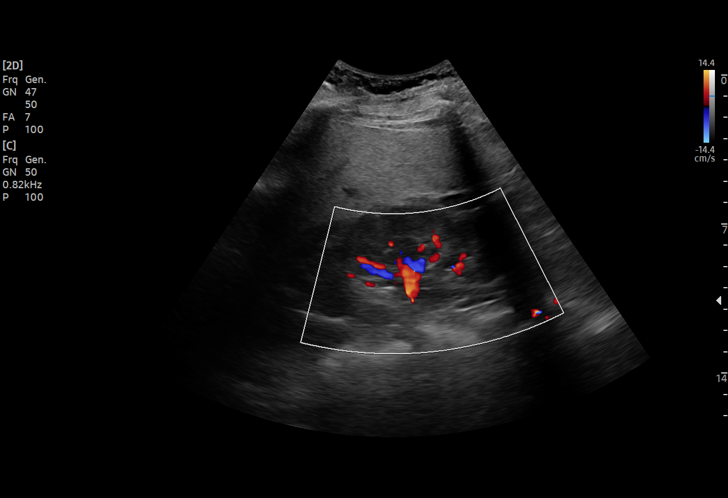

[15 of 25 positions shown; findings below may reference images not displayed]

FINDINGS: Right Kidney:

Renal measurements: 11.1 x 5.3 x 5.6 cm = volume: 168 mL.
Echogenicity within normal limits. No mass or hydronephrosis
visualized.

Left Kidney:

Renal measurements: 11.6 x 4.8 x 5.1 cm = volume: 148 mL.
Echogenicity within normal limits. No mass or hydronephrosis
visualized.

Bladder:

Appears normal for degree of bladder distention.

Other:

Hyperechoic hepatic parenchyma, suggesting hepatic steatosis.
IMPRESSION: Negative renal ultrasound.  No hydronephrosis.

## 2023-09-26 ENCOUNTER — Ambulatory Visit: Payer: Medicaid Other | Admitting: Family Medicine

## 2023-10-15 ENCOUNTER — Telehealth: Payer: Self-pay | Admitting: *Deleted

## 2023-10-15 NOTE — Telephone Encounter (Signed)
Spoke with Deborah [name] who called the office to schedule an appointment with Dr. Tamela Oddi. Patient was last seen on July 7th 2021. Pt was given a new patient appt. On Wednesday, December 11 th at 2:15 pm for 30 minutes with Dr. Tamela Oddi. Pt agreed to appointment date and time.

## 2023-10-17 NOTE — Progress Notes (Deleted)
  Cardiology Office Note:   Date:  10/17/2023  ID:  AUGUSTINE STILSON, DOB 10/04/1966, MRN [id number] PCP: Penne Lash, MD  [facility] Providers Cardiologist:  Rollene Rotunda, MD {  History of Present Illness:   Deborah [name] is a 57 y.o. female who presents for follow up of a reduced EF.  She was seen here in 2000 and and did have a stress perfusion study. This demonstrated possibly a reduced ejection fraction of 47%. There was artifact. There was perhaps some hypoperfusion of the anterior septum. Cardiac catheterization was planned but this did not happen. I saw her in 2016 and I ordered dobutamine echocardiogram. This confirmed the ejection fraction was somewhat low at 35%. I titrated meds and in 2016 her EF was low normal (45 - 50%) and again in 2019.      Since I last saw her ***   ***  she was in the hospital in February.  This was really for diabetes and I did review these records.  She is been on insulin.  I do note that during that time she had some volume overload apparently.  However, an ejection fraction on her echo was actually improved to 55 to 60%.  There were no significant valvular abnormalities.  From a cardiovascular standpoint she is feeling well.  She is lost some weight.  She is doing some walking for exercise.  She is not having any new chest pressure, neck or arm discomfort.  She is not having any new shortness of breath, PND or orthopnea.  She has no palpitations, presyncope or syncope.  She had no chest pressure.    ROS: ***  Studies Reviewed:    EKG:       ***  Risk Assessment/Calculations:   {Does this patient have ATRIAL FIBRILLATION?:[id number]} No BP recorded.  {Refresh Note OR Click here to enter BP  :1}***        Physical Exam:   VS:  LMP 09/20/2012    Wt Readings from Last 3 Encounters:  07/25/23 273 lb 3.2 oz (123.9 kg)  05/16/23 266 lb (120.7 kg)  03/21/23 271 lb (122.9 kg)     GEN: Well nourished, well developed in no acute  distress NECK: No JVD; No carotid bruits CARDIAC: ***RR, *** murmurs, rubs, gallops RESPIRATORY:  Clear to auscultation without rales, wheezing or rhonchi  ABDOMEN: Soft, non-tender, non-distended EXTREMITIES:  No edema; No deformity   ASSESSMENT AND PLAN:   Chronic systolic and diastolic HF:    ***    Actually her ejection fraction is now improved.  She will continue the meds as listed.  No change.   Hypertension:   Her blood pressure is ***  controlled in the context of managing her heart failure.  Continue the meds as listed.    Sleep apnea:   ***  She had previous sleep apnea testing which was positive in 2019.  However, she never received CPAP.  I will request another sleep study so she can get treatment of this.  STOP BANG is at least 4         Follow up ***  Signed, Rollene Rotunda, MD

## 2023-10-18 ENCOUNTER — Ambulatory Visit: Payer: Medicaid Other | Admitting: Cardiology

## 2023-10-18 DIAGNOSIS — G4733 Obstructive sleep apnea (adult) (pediatric): Secondary | ICD-10-CM

## 2023-10-18 DIAGNOSIS — I5042 Chronic combined systolic (congestive) and diastolic (congestive) heart failure: Secondary | ICD-10-CM

## 2023-10-18 DIAGNOSIS — I10 Essential (primary) hypertension: Secondary | ICD-10-CM

## 2023-10-23 ENCOUNTER — Other Ambulatory Visit: Payer: Self-pay | Admitting: Cardiology

## 2023-10-23 DIAGNOSIS — I10 Essential (primary) hypertension: Secondary | ICD-10-CM

## 2023-10-28 ENCOUNTER — Other Ambulatory Visit: Payer: Self-pay | Admitting: Family Medicine

## 2023-10-28 ENCOUNTER — Telehealth: Payer: Self-pay | Admitting: *Deleted

## 2023-10-28 DIAGNOSIS — E1165 Type 2 diabetes mellitus with hyperglycemia: Secondary | ICD-10-CM

## 2023-10-28 MED ORDER — ACCU-CHEK SOFTCLIX LANCETS MISC: 12 refills | Status: DC

## 2023-10-28 MED ORDER — ACCU-CHEK GUIDE ME W/DEVICE KIT: PACK | 0 refills | Status: AC

## 2023-10-28 NOTE — Telephone Encounter (Signed)
Rx request for accu-chek guide test strips. Please advise. Armon Orvis Bruna Potter, CMA

## 2023-10-29 ENCOUNTER — Telehealth: Payer: Self-pay | Admitting: Cardiology

## 2023-10-29 NOTE — Telephone Encounter (Signed)
Pt c/o medication issue:  1. Name of Medication:   carvedilol (COREG) 3.125 MG tablet    2. How are you currently taking this medication (dosage and times per day)?   TAKE 1 TABLET BY MOUTH EVERY MORNING AND 2 TABLETS AT BEDTIME    3. Are you having a reaction (difficulty breathing--STAT)? No  4. What is your medication issue? Pt states that she did not receive the full prescription. Please advise

## 2023-10-29 NOTE — Telephone Encounter (Signed)
Left detailed message as noted for patient (DPR), call back.

## 2023-10-30 ENCOUNTER — Other Ambulatory Visit: Payer: Self-pay

## 2023-10-30 ENCOUNTER — Telehealth: Payer: Self-pay | Admitting: Cardiology

## 2023-10-30 DIAGNOSIS — F32A Depression, unspecified: Secondary | ICD-10-CM

## 2023-10-30 NOTE — Telephone Encounter (Signed)
 Called patient with no answer. Left message to call back

## 2023-10-30 NOTE — Telephone Encounter (Signed)
*  STAT* If patient is at the pharmacy, call can be transferred to refill team.   1. Which medications need to be refilled? (please list name of each medication and dose if known)   carvedilol (COREG) 3.125 MG tablet   2. Would you like to learn more about the convenience, safety, & potential cost savings by using the [facility]?   3. Are you open to using the [facility] (Type [facility]. ).  4. Which pharmacy/location (including street and city if local pharmacy) is medication to be sent to?  [id number] - [place], [place] - [address] AT [facility] OF [address]   5. Do they need a 30 day or 90 day supply?   90 day  Patient stated she will be running out of this medication.

## 2023-10-30 NOTE — Telephone Encounter (Signed)
Pt was given her last attempt on 11/27

## 2023-10-31 MED ORDER — ACCU-CHEK GUIDE TEST VI STRP: ORAL_STRIP | 12 refills | Status: DC

## 2023-10-31 MED ORDER — DIAZEPAM 5 MG PO TABS
ORAL_TABLET | ORAL | 0 refills | Status: DC
Start: 2023-10-31 — End: 2023-11-14

## 2023-10-31 NOTE — Telephone Encounter (Signed)
Called patient with no answer. Left message for patient to return call.

## 2023-11-01 NOTE — Telephone Encounter (Signed)
Called patient with no answer. Left vm to call back. 3rd attempt. No further attempts will be made.

## 2023-11-04 ENCOUNTER — Telehealth: Payer: Self-pay | Admitting: Cardiology

## 2023-11-04 DIAGNOSIS — I10 Essential (primary) hypertension: Secondary | ICD-10-CM

## 2023-11-04 NOTE — Telephone Encounter (Signed)
Pt c/o medication issue:  1. Name of Medication:   carvedilol (COREG) 3.125 MG tablet    2. How are you currently taking this medication (dosage and times per day)? TAKE 1 TABLET BY MOUTH EVERY MORNING AND 2 TABLETS AT BEDTIME   3. Are you having a reaction (difficulty breathing--STAT)? No  4. What is your medication issue? Patient is calling because she only received 45 tablets and would like to know if she could get 225 tablets to equal the 270 tablets for her refill. Patient is on Dr. Jenene Slicker wait list and scheduled for 02/13/23. Please advise.

## 2023-11-04 NOTE — Telephone Encounter (Signed)
 Called patient with no answer. Left message to return call.

## 2023-11-05 NOTE — Telephone Encounter (Signed)
 Attempted to call patient, no answer left message requesting a call back.

## 2023-11-06 ENCOUNTER — Inpatient Hospital Stay: Payer: Medicaid Other | Attending: Obstetrics & Gynecology | Admitting: Obstetrics & Gynecology

## 2023-11-06 ENCOUNTER — Encounter: Payer: Self-pay | Admitting: Obstetrics & Gynecology

## 2023-11-06 VITALS — BP 126/62 | HR 92 | Temp 98.1°F | Resp 20 | Ht 66.0 in | Wt 284.2 lb

## 2023-11-06 DIAGNOSIS — Z9079 Acquired absence of other genital organ(s): Secondary | ICD-10-CM | POA: Insufficient documentation

## 2023-11-06 DIAGNOSIS — Z90722 Acquired absence of ovaries, bilateral: Secondary | ICD-10-CM | POA: Insufficient documentation

## 2023-11-06 DIAGNOSIS — Z923 Personal history of irradiation: Secondary | ICD-10-CM | POA: Diagnosis not present

## 2023-11-06 DIAGNOSIS — Z90710 Acquired absence of both cervix and uterus: Secondary | ICD-10-CM | POA: Insufficient documentation

## 2023-11-06 DIAGNOSIS — Z9221 Personal history of antineoplastic chemotherapy: Secondary | ICD-10-CM | POA: Diagnosis not present

## 2023-11-06 DIAGNOSIS — Z8542 Personal history of malignant neoplasm of other parts of uterus: Secondary | ICD-10-CM | POA: Diagnosis not present

## 2023-11-06 DIAGNOSIS — Z08 Encounter for follow-up examination after completed treatment for malignant neoplasm: Secondary | ICD-10-CM | POA: Insufficient documentation

## 2023-11-06 NOTE — Telephone Encounter (Signed)
 2nd attempt to call patient, no answer left message requesting a call back.

## 2023-11-06 NOTE — Assessment & Plan Note (Addendum)
H/O grade 2 endometrial adenocarcinoma Negative symptoms review, normal exam.  No evidence of recurrence Hyper hydrosis--consider vasomotor sxs of menopause however the sxs are focal  >Annual f/u  >Wear loose, light clothing, and avoid triggers like alcohol and spicy food >Follow-up w/PCP for possible prescription creams > Consider consultation with dermatologist .

## 2023-11-06 NOTE — Patient Instructions (Signed)
Return in 1 year ?

## 2023-11-06 NOTE — Progress Notes (Signed)
Follow Up Note: Gyn-Onc   Deborah [name] 57 y.o. 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    History of endometrial cancer   10/07/2012 Cancer Staging     Cancer Staging History of endometrial cancer Staging form: Corpus Uteri - Carcinoma, AJCC 7th Edition - Clinical: Stage Unknown (T1a, NX, M0) - Unsigned - Pathologic: Stage Unknown (T1a, NX, M0, Free text: IIIC) - Unsigned       10/08/2012 Initial Diagnosis     Endometrial cancer     11/25/2012 Imaging     CT: Shotty bilateral external iliac lymphadenopathy measuring up to 1.3 cm, suspicious for metastatic disease. Nonspecific less than 5 mm retroperitoneal lymph nodes also noted in the left para-aortic region. 2. 2.7 cm postop lymphocele versus low attenuation lymphadenopathy in the proximal left external iliac chain. 3. Cholelithiasis and right nephrolithiasis incidentally noted.     04/07/2013 - 05/25/2013 Radiation Therapy     XRT     08/25/2013 Imaging     CT: no evidence of abdominal lymphadenopathy. There Is no evidence of inflammatory process abscess or ascites the adnexal regions are unremarkable in appearance there is no new or progressive disease within the abdomen or pelvis      - 07/24/2013 Chemotherapy     The patient had 6 cycles of Taxol/Carboplatin and pelvic XRT sandwich fashion           Interval History: She denies any vaginal bleeding, abdominal/pelvic pain, cough, lethargy or abdominal distention.  She mentioned an upset stomach due to something she ate the previous night.  In the past two years, the patient experienced significant health and personal challenges. In 2023, she was diagnosed with diabetes  following an emergency hospital admission due to high blood sugar levels. The patient described this year as particularly difficult but expressed gratitude for her current health status.  The patient also reported a history of excessive sweating, particularly under the armpits. She has tried various antiperspirants, including Secret and Native, to manage this issue. The patient expressed self-consciousness about this condition but was reassured by the doctor that there were no hygiene issues or bacterial odors present.  In addition to her physical health, the patient discussed the emotional impact of the loss of a close doctor, Dr. Nelly Rout, who had a significant impact on her life. The patient expressed sadness and shock upon learning of Dr. Forrestine Him passing.  Overall, the patient appeared to be managing her health conditions and personal challenges with resilience and a positive attitude. She expressed a desire to continue to improve her health and well-being.    Review of Systems  Review of Systems Constitutional: Negative for malaise/fatigue and weight loss. Respiratory: Negative for cough.   Gastrointestinal: Negative for abdominal pain. Genitourinary:  No vaginal bleeding  Psychiatric/Behavioral: Negative.       Current Meds:      Outpatient Encounter Medications as of 06/01/2020  Medication Sig   acetaminophen (TYLENOL) 500 MG tablet Take 1,000 mg by mouth every 6 (six) hours as needed for moderate pain (pain).   bifidobacterium infantis (ALIGN) capsule Take 1 capsule by mouth daily as needed (for regularity). Reported on 02/23/2016   carvedilol (COREG) 3.125 MG tablet Take one (1) tablet (3.125 mg) by mouth each morning. Take two (2) tablets (6.25 mg) by mouth each evening.   diazepam (VALIUM) 5 MG tablet Take 1 tablet (5 mg total) by mouth every 12 (twelve) hours as needed for anxiety.   diclofenac sodium (VOLTAREN) 1 % GEL Apply 4 g topically 4 (four) times daily.    fluticasone (FLONASE) 50 MCG/ACT nasal spray Place 2 sprays into both nostrils daily.   furosemide (LASIX) 20 MG tablet Take 1 tablet (20 mg total) by mouth daily as needed for fluid.   ibuprofen (ADVIL,MOTRIN) 800 MG tablet Take 1 tablet (800 mg total) by mouth every 8 (eight) hours as needed.   lactase (LACTAID) 3000 UNITS tablet Take 1 tablet by mouth 3 (three) times daily as needed (for dairy intake). Reported on 02/23/2016   loperamide (IMODIUM) 2 MG capsule Take 2 mg by mouth 4 (four) times daily as needed for diarrhea or loose stools (diarrhea). Reported on 02/23/2016   magnesium chloride (SLOW-MAG) 64 MG TBEC SR tablet Take 1-2 tablets by mouth 2 (two) times daily. Takes two in the morning and one in the evening   omeprazole (PRILOSEC) 20 MG capsule TAKE 1 CAPSULE(20 MG) BY MOUTH DAILY   polyethylene glycol powder (GLYCOLAX/MIRALAX) powder Take 17 g by mouth daily as needed.   potassium chloride (K-DUR,KLOR-CON) 10 MEQ tablet Take 2 tablets (20 mEq total) by mouth 2 (two) times daily.   sertraline (ZOLOFT) 100 MG tablet Take 1 tablet (100 mg total) by mouth every evening.   sertraline (ZOLOFT) 50 MG tablet Take 1 tablet (50 mg total) by mouth daily. Start with 1/2 tablet for a few days.   tiZANidine (ZANAFLEX) 4 MG tablet Take 1 tablet (4 mg total) by mouth every 6 (six) hours as needed for muscle spasms.      No facility-administered encounter medications on file as of 06/01/2020.        Allergy:  Allergies      Allergies  Allergen Reactions   Lactose Intolerance (Gi) Diarrhea        Social Hx:   Social History         Socioeconomic History   Marital status: Single      Spouse name: Not on file   Number of children: 1   Years of education: Not on file   Highest education level: Not on file  Occupational History   Occupation: Homemaker  Tobacco Use   Smoking status: Never Smoker   Smokeless tobacco: Never Used  Building services engineer Use: Never used  Substance and Sexual  Activity   Alcohol use: No      Comment: rare   Drug use: No   Sexual activity: Yes      Birth control/protection: Condom  Other Topics Concern   Not on file  Social History Narrative   Not on file    Social Determinants of Health       Financial Resource Strain:    Difficulty of Paying Living Expenses:   Food Insecurity:  Worried About Programme researcher, broadcasting/film/video in the Last Year:    Barista in the Last Year:   Transportation Needs:    Freight forwarder (Medical):    Lack of Transportation (Non-Medical):   Physical Activity:    Days of Exercise per Week:    Minutes of Exercise per Session:   Stress:    Feeling of Stress :   Social Connections:    Frequency of Communication with Friends and Family:    Frequency of Social Gatherings with Friends and Family:    Attends Religious Services:    Active Member of Clubs or Organizations:    Attends Banker Meetings:    Marital Status:   Intimate Partner Violence:    Fear of Current or Ex-Partner:    Emotionally Abused:    Physically Abused:    Sexually Abused:       Past Surgical Hx:       Past Surgical History:  Procedure Laterality Date   ABDOMINAL HYSTERECTOMY   10/2011    complete   CHOLECYSTECTOMY N/A 07/08/2015    Procedure: LAPAROSCOPIC CHOLECYSTECTOMY WITH INTRAOPERATIVE CHOLANGIOGRAM;  Surgeon: Jimmye Norman, MD;  Location: [facility] OR;  Service: General;  Laterality: N/A;   HIATAL HERNIA REPAIR       REPAIR VAGINAL CUFF   10/07/2012    Procedure: REPAIR VAGINAL CUFF;  Surgeon: Laurette Schimke, MD PHD;  Location: [facility];  Service: Gynecology;;   ROBOTIC ASSISTED TOTAL HYSTERECTOMY WITH BILATERAL SALPINGO OOPHERECTOMY   10/07/2012    Procedure: ROBOTIC ASSISTED TOTAL HYSTERECTOMY WITH BILATERAL SALPINGO OOPHORECTOMY;  Surgeon: Laurette Schimke, MD PHD;  Location: [facility];  Service: Gynecology;  Laterality: Bilateral;          Past Medical Hx:      Past Medical History:  Diagnosis Date   Anemia      Anxiety     Arthritis      ,Knees, back   Blood in stool 08/28/2012   Congenital birth defect      Right hand   Depression     Educated about COVID-19 virus infection 04/16/2019   Endometrial cancer (HCC)     HLD (hyperlipidemia)      borderline   Hx of echocardiogram      Echo (9/15): EF 50-55%, normal wall motion, grade 2 diastolic dysfunction, mild LAE   Hx of radiation therapy 04/07/13- 05/14/13    pelvis 45 gray 25 fx   Hypertension     Obesity     Swelling      ANKLES - TAKES LASIX   Wrist pain 08/28/2012          Family Hx:       Family History  Problem Relation Age of Onset   Diabetes Mother     Pancreatic cancer Maternal Grandfather          or liver cancer   Hypertension Maternal Grandfather     Cancer Maternal Grandfather          pancreatic   Hypertension Maternal Grandmother     Colon polyps Maternal Aunt     Cancer Maternal Aunt          lung   Colon cancer Cousin     Cancer Cousin          lung   Lung cancer Maternal Aunt     Stroke Cousin     Heart attack Neg Hx  Vitals: BP 109/80 (BP Location: Left Wrist, Patient Position: Sitting)   Pulse 74   Temp 97.8 F (36.6 C) (Oral)   Resp 18   Ht 5\' 6"  (1.676 m)   Wt (!) 301 lb 6 oz (136.7 kg)   LMP 09/20/2012   SpO2 100%   BMI 48.64 kg/m  Physical Exam: Physical Exam Exam conducted with a chaperone present.  Constitutional:      Appearance: She is obese. She is not ill-appearing.  Abdominal:     General: There is no distension.     Palpations: Abdomen is soft. There is no mass.     Tenderness: There is no abdominal tenderness.  Genitourinary:    General: Normal vulva.     Exam position: Lithotomy position.     Vagina: Normal. No lesions. Cystocele present    Uterus: Absent.      Comments: Hyperpigmented skin groin, vulva. Cervix absent.   Lymphadenopathy:     Lower Body: No right inguinal adenopathy. No left inguinal adenopathy.  Skin:    General: Skin is warm and dry.   Neurological:     General: No focal deficit present.  Psychiatric:        Mood and Affect: Mood normal.        Assessment/Plan:  History of endometrial cancer H/O grade 2 endometrial adenocarcinoma Negative symptoms review, normal exam.  No evidence of recurrence Hyper hydrosis--consider vasomotor sxs of menopause however the sxs are focal  >Annual f/u  >Wear loose, light clothing, and avoid triggers like alcohol and spicy food >Follow-up w/PCP for possible prescription creams > Consider consultation with dermatologist .     I personally spent 25 minutes face-to-face and non-face-to-face in the care of this patient, which includes all pre, intra, and post visit time on the date of service.     Antionette Char, MD

## 2023-11-07 MED ORDER — CARVEDILOL 3.125 MG PO TABS
ORAL_TABLET | ORAL | 0 refills | Status: DC
Start: 2023-11-07 — End: 2024-02-14

## 2023-11-07 NOTE — Telephone Encounter (Signed)
Pt returning call to nurse

## 2023-11-07 NOTE — Telephone Encounter (Signed)
Patient identification verified by 2 forms. Marilynn Rail, RN    Received call from patient  Patient states:   -did not receive enough quantity of Carvedilol   -only 45 tablets was dispensed   -has appointment scheduled for 02/13/24 Informed patient message sent to provider  Patient agrees with plan

## 2023-11-07 NOTE — Telephone Encounter (Signed)
Follow Up:      Patient is returning your call. 

## 2023-11-07 NOTE — Telephone Encounter (Signed)
Patient identification verified by 2 forms. Marilynn Rail, RN    Called and spoke to patient  Informed patient per Dr. Antoine Poche okay to provider refill to last until 3/20 appointment  Refill sent  Patient has no further questions at this time

## 2023-11-08 ENCOUNTER — Other Ambulatory Visit: Payer: Self-pay

## 2023-11-08 MED ORDER — OMEPRAZOLE 20 MG PO CPDR: DELAYED_RELEASE_CAPSULE | ORAL | 0 refills | Status: DC

## 2023-11-14 ENCOUNTER — Encounter: Payer: Self-pay | Admitting: Family Medicine

## 2023-11-14 ENCOUNTER — Ambulatory Visit: Payer: Medicaid Other | Admitting: Family Medicine

## 2023-11-14 VITALS — BP 116/74 | HR 97 | Ht 66.0 in | Wt 281.2 lb

## 2023-11-14 DIAGNOSIS — Z7984 Long term (current) use of oral hypoglycemic drugs: Secondary | ICD-10-CM | POA: Diagnosis not present

## 2023-11-14 DIAGNOSIS — M545 Low back pain, unspecified: Secondary | ICD-10-CM | POA: Diagnosis not present

## 2023-11-14 DIAGNOSIS — G8929 Other chronic pain: Secondary | ICD-10-CM | POA: Diagnosis not present

## 2023-11-14 DIAGNOSIS — E119 Type 2 diabetes mellitus without complications: Secondary | ICD-10-CM

## 2023-11-14 DIAGNOSIS — F32A Depression, unspecified: Secondary | ICD-10-CM | POA: Diagnosis not present

## 2023-11-14 DIAGNOSIS — F419 Anxiety disorder, unspecified: Secondary | ICD-10-CM

## 2023-11-14 LAB — POCT GLYCOSYLATED HEMOGLOBIN (HGB A1C): HbA1c, POC (controlled diabetic range): 6 % (ref 0.0–7.0)

## 2023-11-14 MED ORDER — SERTRALINE HCL 25 MG PO TABS: 25.0000 mg | ORAL_TABLET | Freq: Every day | ORAL | 0 refills | Status: DC

## 2023-11-14 MED ORDER — DICLOFENAC SODIUM 1 % EX GEL: 2.0000 g | Freq: Four times a day (QID) | CUTANEOUS | 0 refills | Status: AC

## 2023-11-14 MED ORDER — MECLIZINE HCL 25 MG PO TABS: ORAL_TABLET | ORAL | 0 refills | Status: DC

## 2023-11-14 MED ORDER — DIAZEPAM 5 MG PO TABS: ORAL_TABLET | ORAL | 0 refills | Status: DC

## 2023-11-14 NOTE — Assessment & Plan Note (Signed)
Patient has history of longstanding back pain.  Reports that she discussed this with Dr. Shanon Rosser and is set to follow-up with sports medicine for it.  She says that Tylenol provides her relief but would like to try Voltaren gel in the meantime -Voltaren gel as needed -Patient already referred to sports medicine on earlier visit, will provide phone number for her to call them again

## 2023-11-14 NOTE — Assessment & Plan Note (Addendum)
A1c today was 6.0 - Continue Ozempic 0.5 mg at slightly decreased dose (5 less clicks), will slowly titrate up given GI intolerance  - Continued metformin XR 500 mg once daily. -Lipid panel, will start rosuvastatin pending results of lipid panel.  Likely will need to start at a lower dose due to muscle aches. - D/c atorvastatin

## 2023-11-14 NOTE — Assessment & Plan Note (Signed)
Patient has longstanding history of anxiety and depression.  Was on sertraline at 1 point but stopped taking it.  Given that she is using Valium at least 2-3 times a week we will start her on a daily medication. - Sertraline 50 mg daily - 20 pills of valium 5 mg

## 2023-11-14 NOTE — Patient Instructions (Addendum)
It was great to see you today! Thank you for choosing [facility] for your primary care. Deborah [name] was seen for follow up.  Today we addressed: Diabetes- your A1C is . We will continue your medication as is.  Your trigger finger- [facility] for [phone]  Your anxiety- start the zoloft at 25 mg for one week and increase to 50mg  daily. Please follow up in 3 months! I may prescribe you another medication for your cholesterol after your panel You are due for a diabetic eye exam, please schedule with your optometrist or ophthalmologist  If you haven't already, sign up for My Chart to have easy access to your labs results, and communication with your primary care physician.  We are checking some labs today. If they are abnormal, I will call you. If they are normal, I will send you a MyChart message (if it is active) or a letter in the mail. If you do not hear about your labs in the next 2 weeks, please call the office.   You should return to our clinic No follow-ups on file.  I recommend that you always bring your medications to each appointment as this makes it easy to ensure you are on the correct medications and helps [place] not miss refills when you need them.  Please arrive 15 minutes before your appointment to ensure smooth check in process.  We appreciate your efforts in making this happen.  Please call the clinic at [phone] if your symptoms worsen or you have any concerns.  Thank you for allowing me to participate in your care, Hal Morales, MD 11/14/2023, 1:45 PM PGY-1, [facility]

## 2023-11-14 NOTE — Progress Notes (Signed)
    SUBJECTIVE:   CHIEF COMPLAINT / HPI:  Patient here for PCP follow-up.  Was started on atorvastatin for secondary prevention by Dr. Hildred Laser however reports she stopped taking this medication due to muscle aches.  Patient also states that she would like a refill on her Valium for anxiety.  She says that she takes this every 2 to 3 days as needed for anxiety and that it has helped her a lot.  Discussed starting a daily medication for anxiety so that she would not need to take her Valium as often, she is open to this idea.  PERTINENT  PMH / PSH:  DMII Heart Failure Combined  Anxiety/depression    OBJECTIVE:   BP 116/74   Pulse 97   Ht 5\' 6"  (1.676 m)   Wt 281 lb 3.2 oz (127.6 kg)   LMP 09/20/2012   SpO2 97%   BMI 45.39 kg/m   General: A&O, NAD HEENT: Sunglasses on, EOM intact  Cardiac: RRR, no m/r/g Respiratory: CTAB, normal WOB, no w/c/r GI: Obese, soft, NTTP, non-distended  Extremities: NTTP, bilateral edema 2+ Psych: Appropriate mood and affect   ASSESSMENT/PLAN:   Type 2 diabetes mellitus without complications (HCC) A1c today was 6.0 - Continue Ozempic 0.5 mg at slightly decreased dose (5 less clicks), will slowly titrate up given GI intolerance  - Continued metformin XR 500 mg once daily. -Lipid panel, will start rosuvastatin pending results of lipid panel.  Likely will need to start at a lower dose due to muscle aches. - D/c atorvastatin  Anxiety and depression Patient has longstanding history of anxiety and depression.  Was on sertraline at 1 point but stopped taking it.  Given that she is using Valium at least 2-3 times a week we will start her on a daily medication. - Sertraline 50 mg daily - 20 pills of valium 5 mg   BACK PAIN, LUMBAR Patient has history of longstanding back pain.  Reports that she discussed this with Dr. Shanon Rosser and is set to follow-up with sports medicine for it.  She says that Tylenol provides her relief but would like to try Voltaren gel in  the meantime -Voltaren gel as needed -Patient already referred to sports medicine on earlier visit, will provide phone number for her to call them again   Patient to follow-up in 1 month for addition of sertraline  Hal Morales, MD [facility]

## 2023-11-15 LAB — LIPID PANEL
Chol/HDL Ratio: 3.6 {ratio} (ref 0.0–4.4)
Cholesterol, Total: 191 mg/dL (ref 100–199)
HDL: 53 mg/dL (ref >39)
LDL Chol Calc (NIH): 114 mg/dL — ABNORMAL HIGH (ref 0–99)
Triglycerides: 135 mg/dL (ref 0–149)
VLDL Cholesterol Cal: 24 mg/dL (ref 5–40)

## 2023-11-17 ENCOUNTER — Telehealth: Payer: Self-pay | Admitting: Family Medicine

## 2023-11-17 ENCOUNTER — Encounter: Payer: Self-pay | Admitting: Family Medicine

## 2023-11-17 DIAGNOSIS — E1169 Type 2 diabetes mellitus with other specified complication: Secondary | ICD-10-CM

## 2023-11-17 NOTE — Telephone Encounter (Signed)
Called patient to discuss results of lipid panel.  Patient did not answer so left voicemail with office phone number.  Will also send patient MyChart message to call office to discuss results.

## 2023-11-21 NOTE — Telephone Encounter (Signed)
-----   Message from [facility] sent at 11/18/2023 10:23 AM EST ----- Regarding: Labs Can we try and call her to discuss results? Would like to start her on rosuvastatin 5mg  given LDL. This statin has less side effects so should be better tolerated. ----- Message ----- From: Jennette Bill, CMA Sent: 11/14/2023   1:51 PM EST To: Penne Lash, MD

## 2023-11-29 ENCOUNTER — Other Ambulatory Visit: Payer: Self-pay | Admitting: Family Medicine

## 2023-11-29 DIAGNOSIS — E1169 Type 2 diabetes mellitus with other specified complication: Secondary | ICD-10-CM

## 2023-11-29 MED ORDER — ROSUVASTATIN CALCIUM 5 MG PO TABS: 5.0000 mg | ORAL_TABLET | Freq: Every day | ORAL | 0 refills | Status: DC

## 2023-11-29 NOTE — Telephone Encounter (Signed)
 Patient returns call to nurse line regarding results.   Advised of message per Dr. Lonnie. She is interested in starting rosuvastatin  5 mg. Requesting that prescription be sent to [facility].   She will call back if she has any adverse side effects to medication.   Chiquita JAYSON English, RN

## 2023-12-26 ENCOUNTER — Encounter: Payer: Self-pay | Admitting: Pharmacist

## 2023-12-26 ENCOUNTER — Ambulatory Visit: Payer: Medicaid Other | Admitting: Pharmacist

## 2023-12-26 VITALS — BP 100/72 | HR 94 | Wt 284.0 lb

## 2023-12-26 DIAGNOSIS — E119 Type 2 diabetes mellitus without complications: Secondary | ICD-10-CM

## 2023-12-26 NOTE — Progress Notes (Signed)
S:     Chief Complaint  Patient presents with   Medication Management    Diabetes - weight management   58 y.o. female who presents for diabetes evaluation, education, and management. Patient arrives in  good spirits and presents without any assistance.   Patient was referred and last seen by Primary Care Provider, Dr. Georg Ruddle, on 11/14/2023.   PMH is significant for Diabetes and Anxiety.  At last visit, patient was started on sertraline 25mg  - she has not yet increase dose to 50mg .   At last pharmacist visit we discussed slight decrease from 0.5mg  weekly dose of Ozempic (semaglutide) by reducing 5 clicks.  Patient continues to use this dose currently and is tolerating.   Current diabetes medications include: metformin XR 500 mg once daily, Ozempic (semaglutide) 0.5 mg once weekly  Current hyperlipidemia medications include: rosuvastatin 5mg  daily (minimal myalgia/aches)  Patient reports adherence to taking all medications as prescribed.    Do you feel that your medications are working for you? yes Have you been experiencing any side effects to the medications prescribed? Yes - intermittent muscle symptoms that patient believes could be rosuvastatin.   Discussed trial of 3-4 days per week in place of daily in the future if tolerability is an issue. Insurance coverage: Medicaid. - trying to get eye exam scheduled - shared [facility] as an option.   Patient denies hypoglycemic events.  Reported home fasting blood sugars: 98-139 with majority of readings 105-120   Patient reported dietary intake including Avoids all Fried food Minimal carbs (bread/rice) Fruits, Vegetables, cabbage, peppers, green beans, Gala apples  Drinking water, tea  coke zero (limited to 3 per week) Stopped chips and regular soda since diagnosis. Denies any Mt. Dew since DM Dx  Patient-reported exercise habits: possibly slightly less with cold weather but reports that she has consistent with walking  program.  Reports continued success with limiting carbohydrate intake and use of more vegetables to replace carbs.    O:   Review of Systems  Musculoskeletal:  Negative for myalgias.  Psychiatric/Behavioral:  The patient is not nervous/anxious (decreased intake).   All other systems reviewed and are negative.   Physical Exam Constitutional:      Appearance: Normal appearance.  Pulmonary:     Effort: Pulmonary effort is normal.  Neurological:     Mental Status: She is alert.  Psychiatric:        Mood and Affect: Mood normal.      Lab Results  Component Value Date   HGBA1C 6.0 11/14/2023   Vitals:   12/26/23 1407  BP: 100/72  Pulse: 94  SpO2: 97%    Lipid Panel     Component Value Date/Time   CHOL 191 11/14/2023 1439   TRIG 135 11/14/2023 1439   HDL 53 11/14/2023 1439   CHOLHDL 3.6 11/14/2023 1439   CHOLHDL 5.1 07/17/2013 0923   VLDL 30 07/17/2013 0923   LDLCALC 114 (H) 11/14/2023 1439   LDLDIRECT 130 (H) 02/14/2012 1424    Clinical Atherosclerotic Cardiovascular Disease (ASCVD): No  The 10-year ASCVD risk score (Arnett DK, et al., 2019) is: 4.9%   Values used to calculate the score:     Age: 8 years     Sex: Female     Is Non-Hispanic African American: Yes     Diabetic: Yes     Tobacco smoker: No     Systolic Blood Pressure: 100 mmHg     Is BP treated: Yes  HDL Cholesterol: 53 mg/dL     Total Cholesterol: 191 mg/dL   Patient is participating in a Managed Medicaid Plan:  Yes   A/P: Diabetes longstanding. Patient reports GI side effects following Ozempic (semaglutide) administration have decreased since reducing 5 clicks from the 0.5mg  weekly dose.  Medication adherence appears good. Control is optimal due to blood sugars and recent A1c (6% 11/14/23). - Continue the slightly decreased of Ozempic (semaglutide) at 5 clicks less than 0.5 mg once weekly as this appears to have improved GI tolerance while maintaining efficacy.  - Continued metformin XR  500 mg once daily. - Patient educated on purpose, proper use, and potential adverse effects.   - Extensively discussed pathophysiology of diabetes, recommended lifestyle interventions, dietary effects on blood sugar control.  - Patient has plan to contact [facility] for DM eye exam.   ASCVD risk - secondary prevention in patient with diabetes. Last LDL is 114 and remains not at goal of <70 mg/dL.  - Intolerance to previous trial of atorvastatin due to myalgia reviewed and documented as medication intolerance in chart.  - Continue rosuvastatin at 5mg  daily at this time.  Discussed possibility of decreasing to 3-4 days per week if intolerance becomes an issue.  - Plan to repeat direct LDL and consider addition of either ezetimibe or bempedoic acid if LDL remains > 70  Written patient instructions provided. Patient verbalized understanding of treatment plan.  Total time in face to face counseling 29 minutes.    Follow-up:  Pharmacist PRN - anticipate 6 months (05/2024) PCP clinic visit in 1-2 months

## 2023-12-26 NOTE — Assessment & Plan Note (Signed)
Diabetes longstanding. Patient reports GI side effects following Ozempic (semaglutide) administration have decreased since reducing 5 clicks from the 0.5mg  weekly dose.  Medication adherence appears good. Control is optimal due to blood sugars and recent A1c (6% 11/14/23). - Continue the slightly decreased of Ozempic (semaglutide) at 5 clicks less than 0.5 mg once weekly as this appears to have improved GI tolerance while maintaining efficacy.  - Continued metformin XR 500 mg once daily. - Patient educated on purpose, proper use, and potential adverse effects.   - Extensively discussed pathophysiology of diabetes, recommended lifestyle interventions, dietary effects on blood sugar control.

## 2023-12-26 NOTE — Patient Instructions (Signed)
It was nice to see you today!  Keep up the great work you are doing with your exercise and diet plan.   Your goal blood sugar is 80-130 before eating and less than 180 after eating.  Medication Changes: Continue all other medication the same.   Monitor blood sugars at home and keep a log (glucometer or piece of paper) to bring with you to your next visit.  Thanks for bringing in your results.   Keep up the good work with diet and exercise. Aim for a diet full of vegetables, fruit and lean meats (chicken, [place], fish). Try to limit salt intake by eating fresh or frozen vegetables (instead of canned), rinse canned vegetables prior to cooking and do not add any additional salt to meals.

## 2023-12-27 ENCOUNTER — Other Ambulatory Visit: Payer: Self-pay | Admitting: Family Medicine

## 2023-12-27 DIAGNOSIS — F32A Depression, unspecified: Secondary | ICD-10-CM

## 2023-12-30 ENCOUNTER — Encounter: Payer: Self-pay | Admitting: Pharmacist

## 2023-12-30 NOTE — Progress Notes (Signed)
 Reviewed and agree with Dr Macky Lower plan.

## 2024-01-29 ENCOUNTER — Other Ambulatory Visit: Payer: Self-pay | Admitting: Family Medicine

## 2024-01-29 DIAGNOSIS — E1169 Type 2 diabetes mellitus with other specified complication: Secondary | ICD-10-CM

## 2024-02-12 ENCOUNTER — Other Ambulatory Visit: Payer: Self-pay | Admitting: Cardiology

## 2024-02-12 ENCOUNTER — Other Ambulatory Visit: Payer: Self-pay | Admitting: Family Medicine

## 2024-02-12 DIAGNOSIS — I10 Essential (primary) hypertension: Secondary | ICD-10-CM

## 2024-02-12 NOTE — Progress Notes (Deleted)
  Cardiology Office Note:   Date:  02/12/2024  ID:  Deborah [name], DOB [birth year], MRN [id number] PCP: Penne Lash, MD  [facility] Providers Cardiologist:  Rollene Rotunda, MD {  History of Present Illness:   Deborah [name] is a 58 y.o. female who presents for follow up of a reduced EF.  She was seen here in 2000 and and did have a stress perfusion study. This demonstrated possibly a reduced ejection fraction of 47%. There was artifact. There was perhaps some hypoperfusion of the anterior septum. Cardiac catheterization was planned but this did not happen. I saw her in 2016 and I ordered dobutamine echocardiogram. This confirmed the ejection fraction was somewhat low at 35%. I titrated meds and in 2016 her EF was low normal (45 - 50%) and again in 2019.     Since I last saw her ***   ***  Since I last saw her she was in the hospital in February.  This was really for diabetes and I did review these records.  She is been on insulin.  I do note that during that time she had some volume overload apparently.  However, an ejection fraction on her echo was actually improved to 55 to 60%.  There were no significant valvular abnormalities.  From a cardiovascular standpoint ***   ** she is feeling well.  She is lost some weight.  She is doing some walking for exercise.  She is not having any new chest pressure, neck or arm discomfort.  She is not having any new shortness of breath, PND or orthopnea.  She has no palpitations, presyncope or syncope.  She had no chest pressure.  ROS: ***  Studies Reviewed:    EKG:       ***  Risk Assessment/Calculations:   {Does this patient have ATRIAL FIBRILLATION?[phone]} No BP recorded.  {Refresh Note OR Click here to enter BP  :1}***        Physical Exam:   VS:  LMP 09/20/2012    Wt Readings from Last 3 Encounters:  12/26/23 284 lb (128.8 kg)  11/14/23 281 lb 3.2 oz (127.6 kg)  11/06/23 284 lb 3.2 oz (128.9 kg)     GEN: Well  nourished, well developed in no acute distress NECK: No JVD; No carotid bruits CARDIAC: ***RR, *** murmurs, rubs, gallops RESPIRATORY:  Clear to auscultation without rales, wheezing or rhonchi  ABDOMEN: Soft, non-tender, non-distended EXTREMITIES:  No edema; No deformity   ASSESSMENT AND PLAN:   Chronic systolic and diastolic HF:    ***  Actually her ejection fraction is now improved.  She will continue the meds as listed.  No change.   Hypertension:   ***  Her blood pressure is controlled in the context of managing her heart failure.  Continue the meds as listed.    Sleep apnea:  ***  She had previous sleep apnea testing which was positive in 2019.  However, she never received CPAP.  I will request another sleep study so she can get treatment of this.  STOP BANG is at least 4       Follow up ***  Signed, Rollene Rotunda, MD

## 2024-02-13 ENCOUNTER — Ambulatory Visit: Payer: Medicaid Other | Admitting: Cardiology

## 2024-02-13 DIAGNOSIS — I5042 Chronic combined systolic (congestive) and diastolic (congestive) heart failure: Secondary | ICD-10-CM

## 2024-02-13 DIAGNOSIS — I10 Essential (primary) hypertension: Secondary | ICD-10-CM

## 2024-02-18 ENCOUNTER — Other Ambulatory Visit: Payer: Self-pay | Admitting: Family Medicine

## 2024-02-19 ENCOUNTER — Telehealth: Payer: Self-pay

## 2024-02-19 MED ORDER — FLUOXETINE HCL 10 MG PO CAPS: 10.0000 mg | ORAL_CAPSULE | Freq: Every day | ORAL | 0 refills | Status: DC

## 2024-02-19 NOTE — Telephone Encounter (Signed)
 Patient's insurance does not cover sertraline please send an alternative medication to [facility]. Thanks! Penni Bombard CMA

## 2024-02-19 NOTE — Telephone Encounter (Signed)
 Attempted to call patient, no answer, left voicemail informing patient that I would send her a MyChart message regarding medications.  Received notification that patient's insurance does not cover sertraline.  Reviewed patient's chart and recent clinic visits.  Sent for fluoxetine 10 daily and will inform patient on how to take it and MyChart message.

## 2024-02-19 NOTE — Telephone Encounter (Signed)
 Chart reviewed.  Updated prescription to reflect most recent dosage per recent clinic visits which is 500 mg once daily. Last A1c 6.0 in December 2024.  Plan for patient to return to clinic to recheck A1c in next 1-2 months.

## 2024-02-20 ENCOUNTER — Encounter: Payer: Self-pay | Admitting: Family Medicine

## 2024-02-26 ENCOUNTER — Other Ambulatory Visit: Payer: Self-pay | Admitting: Family Medicine

## 2024-02-26 DIAGNOSIS — H905 Unspecified sensorineural hearing loss: Secondary | ICD-10-CM

## 2024-04-03 ENCOUNTER — Other Ambulatory Visit: Payer: Self-pay | Admitting: Family Medicine

## 2024-04-03 DIAGNOSIS — F32A Depression, unspecified: Secondary | ICD-10-CM

## 2024-04-10 ENCOUNTER — Ambulatory Visit: Admitting: Family Medicine

## 2024-04-17 ENCOUNTER — Ambulatory Visit: Admitting: Family Medicine

## 2024-04-22 NOTE — Progress Notes (Deleted)
  Cardiology Office Note:   Date:  04/22/2024  ID:  Deborah [name], DOB [birth year], MRN [id number] PCP: Farris Hong, MD  [facility] Providers Cardiologist:  Eilleen Grates, MD {  History of Present Illness:   Deborah [name] is a 58 y.o. female  who presents for follow up of a reduced EF.  She was seen here in 2000 and and did have a stress perfusion study. This demonstrated possibly a reduced ejection fraction of 47%. There was artifact. There was perhaps some hypoperfusion of the anterior septum. Cardiac catheterization was planned but this did not happen. I saw her in 2016 and I ordered dobutamine  echocardiogram. This confirmed the ejection fraction was somewhat low at 35%. I titrated meds and in 2016 her EF was low normal (45 - 50%) and again in 2019.    Echo in 2023 demonstrated an EF of 55 - 60%.  ***     Since I last saw her ***   ***   she was in the hospital in February.  This was really for diabetes and I did review these records.  She is been on insulin .  I do note that during that time she had some volume overload apparently.  However, an ejection fraction on her echo was actually improved to 55 to 60%.  There were no significant valvular abnormalities.  From a cardiovascular standpoint she is feeling well.  She is lost some weight.  She is doing some walking for exercise.  She is not having any new chest pressure, neck or arm discomfort.  She is not having any new shortness of breath, PND or orthopnea.  She has no palpitations, presyncope or syncope.  She had no chest pressure.  ROS: ***  Studies Reviewed:    EKG:       ***  Risk Assessment/Calculations:   {Does this patient have ATRIAL FIBRILLATION?[phone]} No BP recorded.  {Refresh Note OR Click here to enter BP  :1}***        Physical Exam:   VS:  LMP 09/20/2012    Wt Readings from Last 3 Encounters:  12/26/23 284 lb (128.8 kg)  11/14/23 281 lb 3.2 oz (127.6 kg)  11/06/23 284 lb 3.2 oz (128.9 kg)      GEN: Well nourished, well developed in no acute distress NECK: No JVD; No carotid bruits CARDIAC: ***RR, *** murmurs, rubs, gallops RESPIRATORY:  Clear to auscultation without rales, wheezing or rhonchi  ABDOMEN: Soft, non-tender, non-distended EXTREMITIES:  No edema; No deformity   ASSESSMENT AND PLAN:   Chronic systolic and diastolic HF:   ***   Actually her ejection fraction is now improved.  She will continue the meds as listed.  No change.   Hypertension:   Her blood pressure is *** controlled in the context of managing her heart failure.  Continue the meds as listed.    Sleep apnea:  ***  She had previous sleep apnea testing which was positive in 2019.  However, she never received CPAP.  I will request another sleep study so she can get treatment of this.  STOP BANG is at least 4     Follow up ***  Signed, Eilleen Grates, MD

## 2024-04-23 ENCOUNTER — Ambulatory Visit: Admitting: Cardiology

## 2024-04-23 DIAGNOSIS — I5022 Chronic systolic (congestive) heart failure: Secondary | ICD-10-CM

## 2024-04-23 DIAGNOSIS — G4733 Obstructive sleep apnea (adult) (pediatric): Secondary | ICD-10-CM

## 2024-04-23 DIAGNOSIS — I10 Essential (primary) hypertension: Secondary | ICD-10-CM

## 2024-04-27 ENCOUNTER — Other Ambulatory Visit: Payer: Self-pay | Admitting: Family Medicine

## 2024-04-27 DIAGNOSIS — H905 Unspecified sensorineural hearing loss: Secondary | ICD-10-CM

## 2024-05-07 ENCOUNTER — Ambulatory Visit: Admitting: Family Medicine

## 2024-05-07 ENCOUNTER — Encounter: Payer: Self-pay | Admitting: Family Medicine

## 2024-05-07 VITALS — BP 106/75 | HR 87 | Ht 65.0 in | Wt 293.4 lb

## 2024-05-07 DIAGNOSIS — Z1231 Encounter for screening mammogram for malignant neoplasm of breast: Secondary | ICD-10-CM | POA: Diagnosis not present

## 2024-05-07 DIAGNOSIS — E785 Hyperlipidemia, unspecified: Secondary | ICD-10-CM | POA: Diagnosis not present

## 2024-05-07 DIAGNOSIS — Z1211 Encounter for screening for malignant neoplasm of colon: Secondary | ICD-10-CM

## 2024-05-07 DIAGNOSIS — E119 Type 2 diabetes mellitus without complications: Secondary | ICD-10-CM

## 2024-05-07 DIAGNOSIS — F419 Anxiety disorder, unspecified: Secondary | ICD-10-CM | POA: Diagnosis not present

## 2024-05-07 DIAGNOSIS — F32A Depression, unspecified: Secondary | ICD-10-CM | POA: Diagnosis not present

## 2024-05-07 DIAGNOSIS — E1169 Type 2 diabetes mellitus with other specified complication: Secondary | ICD-10-CM

## 2024-05-07 LAB — POCT GLYCOSYLATED HEMOGLOBIN (HGB A1C): HbA1c, POC (controlled diabetic range): 5.9 % (ref 0.0–7.0)

## 2024-05-07 MED ORDER — DIAZEPAM 5 MG PO TABS: 5.0000 mg | ORAL_TABLET | Freq: Every day | ORAL | 0 refills | Status: DC | PRN

## 2024-05-07 MED ORDER — SERTRALINE HCL 50 MG PO TABS: 50.0000 mg | ORAL_TABLET | Freq: Every day | ORAL | 0 refills | Status: DC

## 2024-05-07 NOTE — Progress Notes (Signed)
    SUBJECTIVE:   CHIEF COMPLAINT / HPI:   Here for follow up for diabetes, cholesterol. Patient reports she has been doing well since last visit.  Diabetes: doing well on current regimen. Has had some recent stressors where she feels she is not eating as good as she has been in the past, but otherwise doing well.  Cholesterol Feels she is still unable to tolerate rosuvastatin  d/t joint pain and would like to stop it. She is not wanting to cut back on dosing at this time and wants to trial another medication if lipid panel consistent with elevated LDLs today.   Anxiety/Depression Has experienced loss of multiple friends/family over the past few months and feels her anxiety is worsened. We discussed restarting sertraline  at last visit which she has not done due to her insurance not covering it. She had some prior left over. She was given alternative of fluoxetine  but was not informed of the change so she did not take this medication. She would like to stick to sertraline  even though there is a chance her insurance may not cover it. She would like to order this again. Reports using valium  4-5 days/ week due to increased stress. Would like to continue to get 20-25 pills/ month and work towards cutting back on this as we restart daily sertraline .   PERTINENT  PMH / PSH:  DMII HLD Anxiety/Depression   OBJECTIVE:   BP 106/75   Pulse 87   Ht 5' 5 (1.651 m)   Wt 293 lb 6.4 oz (133.1 kg)   LMP 09/20/2012   SpO2 99%   BMI 48.82 kg/m   General: A&O, NAD, wearing sunglasses  HEENT: atraumatic, normocephalic  Cardiac: RRR, no m/r/g Respiratory: CTAB, normal WOB GI: Obese, soft, NTND Extremities: NTTP, bilateral swelling which is chronic for patient without pitting edema  Neuro: No acute neurological deficits  Psych: Appropriate mood and affect   ASSESSMENT/PLAN:   Assessment & Plan Type 2 diabetes mellitus without complication, unspecified whether long term insulin  use (HCC) A1C:  5.9% Continue taking medication as prescribed.  Patient unable to provide urine during encounter, will need urine microalbumin/Cr at next visit. BMP ordered. Follow up in 3 months  Hyperlipidemia associated with type 2 diabetes mellitus [facility]) Patient feels she cannot tolerate rosuvastatin . Stopped taking it two weeks ago. Feels much better since she stopped.  - Repeat LDL - Consider ezetimibe if still elevated  Anxiety and depression Patient with increased anxiety/depression. Will reorder valium  and sertraline . Follow up in 1-2 months.   Health Maintenance: - ordered colonscopy  Johnella Naas, MD [facility]

## 2024-05-07 NOTE — Patient Instructions (Addendum)
 It was wonderful to see you today.  Please bring ALL of your medications with you to every visit.   Today we talked about:  Diabetes Your diabetes is well controlled Medicine Changes: none at this time  Depression/Anxiety - We will continue sertraline  50 mg daily and valium  as needed.   Cholesterol We will check your cholesterol levels today. We may need to adjust your medications based on this.   Colonoscopy You are due for your colonoscopy. They should be calling you regarding this.   Thank you for choosing [facility].   Please call [phone] with any questions about today's appointment.  Please arrive at least 15 minutes prior to your scheduled appointments.   If you had blood work today, I will send you a MyChart message or a letter if results are normal. Otherwise, I will give you a call.   If you had a referral placed, they will call you to set up an appointment. Please give us  a call if you don't hear back in the next 2 weeks.   If you need additional refills before your next appointment, please call your pharmacy first.   You should follow up in our clinic in [place] in about 3 months (around 08/07/2024).  Johnella Naas, MD Family Medicine

## 2024-05-07 NOTE — Assessment & Plan Note (Addendum)
 A1C: 5.9% Continue taking medication as prescribed.  Patient unable to provide urine during encounter, will need urine microalbumin/Cr at next visit. BMP ordered. Follow up in 3 months

## 2024-05-07 NOTE — Assessment & Plan Note (Signed)
 Patient with increased anxiety/depression. Will reorder valium  and sertraline . Follow up in 1-2 months.

## 2024-05-08 ENCOUNTER — Ambulatory Visit: Payer: Self-pay | Admitting: Family Medicine

## 2024-05-08 LAB — BASIC METABOLIC PANEL WITH GFR
BUN/Creatinine Ratio: 23 (ref 9–23)
BUN: 18 mg/dL (ref 6–24)
CO2: 18 mmol/L — ABNORMAL LOW (ref 20–29)
Calcium: 9.1 mg/dL (ref 8.7–10.2)
Chloride: 102 mmol/L (ref 96–106)
Creatinine, Ser: 0.79 mg/dL (ref 0.57–1.00)
Glucose: 82 mg/dL (ref 70–99)
Potassium: 3.8 mmol/L (ref 3.5–5.2)
Sodium: 138 mmol/L (ref 134–144)
eGFR: 88 mL/min/{1.73_m2} (ref >59)

## 2024-05-08 LAB — LDL CHOLESTEROL, DIRECT: LDL Direct: 108 mg/dL — ABNORMAL HIGH (ref 0–99)

## 2024-05-08 NOTE — Telephone Encounter (Signed)
 Left message for Deborah [name], requested a call back related to upcomming 06/11/2024 appointment.

## 2024-05-21 ENCOUNTER — Telehealth: Payer: Self-pay

## 2024-05-21 NOTE — Telephone Encounter (Signed)
 Patient LVM on nurse line requesting a call back to discuss something I saw on my mychart.   Called patient, however no answer or option for VM.   I will be happy to discuss with patient once she calls back.

## 2024-05-21 NOTE — Telephone Encounter (Signed)
 Patient returns my call to nurse line.   She reports she viewed her results on mychart and PCP recommendations.   She reports she would like to start a medication now vs waiting for FU with Dr. Koval.   Advised will send to PCP to initiate.

## 2024-05-25 MED ORDER — EZETIMIBE 10 MG PO TABS: 10.0000 mg | ORAL_TABLET | Freq: Every day | ORAL | 11 refills | Status: AC

## 2024-05-25 NOTE — Telephone Encounter (Signed)
 Patient contacted for follow-up of questions related to statin-intolerance and starting ezetimibe.   Following discussion with patient, she was reluctant to try any statin.  She was willing to try ezetimibe. Patient educated on purpose, proper use and potential adverse effects.  Following instruction patient verbalized understanding of treatment plan.   Medication Plan: - Start Ezetimibe 10mg  once daily  New prescription sent to pharmacy of choice for patient.   Total time with patient call and documentation of interaction: 9 minutes.

## 2024-05-27 NOTE — Telephone Encounter (Signed)
 Reviewed and agree with Dr Macky Lower plan.

## 2024-05-28 ENCOUNTER — Other Ambulatory Visit: Payer: Self-pay | Admitting: Family Medicine

## 2024-05-28 DIAGNOSIS — Z1231 Encounter for screening mammogram for malignant neoplasm of breast: Secondary | ICD-10-CM

## 2024-06-05 ENCOUNTER — Other Ambulatory Visit: Payer: Self-pay | Admitting: Family Medicine

## 2024-06-05 DIAGNOSIS — F419 Anxiety disorder, unspecified: Secondary | ICD-10-CM

## 2024-06-09 ENCOUNTER — Telehealth: Payer: Self-pay | Admitting: *Deleted

## 2024-06-09 ENCOUNTER — Other Ambulatory Visit: Payer: Self-pay

## 2024-06-09 DIAGNOSIS — F419 Anxiety disorder, unspecified: Secondary | ICD-10-CM

## 2024-06-09 NOTE — Telephone Encounter (Signed)
 Patient calls nurse line regarding Diazepam  prescription.   She states that she discussed with provider refilling for 25 tablets, however, rx from today is for 20 tablets.   Patient is asking if Dr. Lonnie can send in updated order with 25 tablet quantity.   Forwarding request to PCP.   Deborah JAYSON English, RN

## 2024-06-09 NOTE — Telephone Encounter (Signed)
 Left HIPAA-compliant message on VM at both numbers requesting call back. Direct number provided.  Patient has canceled past 3 appointments with Dr. Lavona. Will attempt to confirm appointment.

## 2024-06-10 ENCOUNTER — Other Ambulatory Visit: Payer: Self-pay | Admitting: Family Medicine

## 2024-06-10 DIAGNOSIS — F419 Anxiety disorder, unspecified: Secondary | ICD-10-CM

## 2024-06-10 MED ORDER — DIAZEPAM 5 MG PO TABS: 5.0000 mg | ORAL_TABLET | Freq: Every day | ORAL | 0 refills | Status: DC | PRN

## 2024-06-10 NOTE — Telephone Encounter (Signed)
 Pt returned call. Discussed multiple same-day appointment cancellations. Pt has rescheduled 06/11/24 OV appointment to 07/24/24. She is on the wait list for a sooner appointment. Encouraged pt to keep upcoming appointment and explained that continued same-day cancellations may lead to dismissal by provider. Pt verbalized understanding and states she will discuss this with Dr. Lavona at her upcoming visit. She denied additional questions prior to disconnecting call.

## 2024-06-10 NOTE — Progress Notes (Signed)
 Ordered 25 tablets of valium 

## 2024-06-10 NOTE — Telephone Encounter (Signed)
 Second attempt:  Pt rescheduled appointment with Dr. Lavona to 07/24/24.   Attempted to reach patient at both home and cell numbers. Left HIPAA-compliant VM at home number. Cell number rang and went to VM x2, then abruptly disconnected before finishing recording both times (unable to leave message).

## 2024-06-11 ENCOUNTER — Ambulatory Visit: Admitting: Cardiology

## 2024-06-25 ENCOUNTER — Other Ambulatory Visit: Payer: Self-pay | Admitting: Family Medicine

## 2024-07-06 ENCOUNTER — Other Ambulatory Visit: Payer: Self-pay | Admitting: Family Medicine

## 2024-07-06 DIAGNOSIS — H905 Unspecified sensorineural hearing loss: Secondary | ICD-10-CM

## 2024-07-16 ENCOUNTER — Ambulatory Visit
Admission: RE | Admit: 2024-07-16 | Discharge: 2024-07-16 | Disposition: A | Discharge status: Home / Self Care | Source: Ambulatory Visit | Attending: Family Medicine | Admitting: Family Medicine

## 2024-07-16 DIAGNOSIS — Z1231 Encounter for screening mammogram for malignant neoplasm of breast: Secondary | ICD-10-CM | POA: Diagnosis not present

## 2024-07-22 ENCOUNTER — Ambulatory Visit

## 2024-07-22 ENCOUNTER — Encounter: Payer: Self-pay | Admitting: Emergency Medicine

## 2024-07-22 ENCOUNTER — Ambulatory Visit: Attending: Physician Assistant | Admitting: Emergency Medicine

## 2024-07-22 VITALS — BP 117/80 | HR 95 | Ht 65.0 in | Wt 296.8 lb

## 2024-07-22 DIAGNOSIS — I10 Essential (primary) hypertension: Secondary | ICD-10-CM | POA: Diagnosis not present

## 2024-07-22 DIAGNOSIS — G4730 Sleep apnea, unspecified: Secondary | ICD-10-CM | POA: Insufficient documentation

## 2024-07-22 DIAGNOSIS — I5042 Chronic combined systolic (congestive) and diastolic (congestive) heart failure: Secondary | ICD-10-CM | POA: Diagnosis not present

## 2024-07-22 DIAGNOSIS — R002 Palpitations: Secondary | ICD-10-CM | POA: Insufficient documentation

## 2024-07-22 NOTE — Patient Instructions (Signed)
 Medication Instructions:  Your physician recommends that you continue on your current medications as directed. Please refer to the Current Medication list given to you today.  *If you need a refill on your cardiac medications before your next appointment, please call your pharmacy*  Lab Work: None ordered  If you have labs (blood work) drawn today and your tests are completely normal, you will receive your results only by: MyChart Message (if you have MyChart) OR A paper copy in the mail If you have any lab test that is abnormal or we need to change your treatment, we will call you to review the results.  Testing/Procedures: Your physician has requested that you have an echocardiogram. Echocardiography is a painless test that uses sound waves to create images of your heart. It provides your doctor with information about the size and shape of your heart and how well your heart's chambers and valves are working. This procedure takes approximately one hour. There are no restrictions for this procedure. Please do NOT wear cologne, perfume, aftershave, or lotions (deodorant is allowed). Please arrive 15 minutes prior to your appointment time.  Please note: We ask at that you not bring children with you during ultrasound (echo/ vascular) testing. Due to room size and safety concerns, children are not allowed in the ultrasound rooms during exams. Our front office staff cannot provide observation of children in our lobby area while testing is being conducted. An adult accompanying a patient to their appointment will only be allowed in the ultrasound room at the discretion of the ultrasound technician under special circumstances. We apologize for any inconvenience.  Your physician has recommended that you have a sleep study. This test records several body functions during sleep, including: brain activity, eye movement, oxygen and carbon dioxide blood levels, heart rate and rhythm, breathing rate and  rhythm, the flow of air through your mouth and nose, snoring, body muscle movements, and chest and belly movement.   ZIO XT- Long Term Monitor Instructions  Your physician has requested you wear a ZIO patch monitor for 14 days.  This is a single patch monitor. [employer] supplies one patch monitor per enrollment. Additional stickers are not available. Please do not apply patch if you will be having a Nuclear Stress Test,  Echocardiogram, Cardiac CT, MRI, or Chest Xray during the period you would be wearing the  monitor. The patch cannot be worn during these tests. You cannot remove and re-apply the  ZIO XT patch monitor.  Your ZIO patch monitor will be mailed 3 day USPS to your address on file. It may take 3-5 days  to receive your monitor after you have been enrolled.  Once you have received your monitor, please review the enclosed instructions. Your monitor  has already been registered assigning a specific monitor serial # to you.  Billing and Patient Assistance Program Information  We have supplied [employer] with any of your insurance information on file for billing purposes. [employer] offers a sliding scale Patient Assistance Program for patients that do not have  insurance, or whose insurance does not completely cover the cost of the ZIO monitor.  You must apply for the Patient Assistance Program to qualify for this discounted rate.  To apply, please call [employer] at [phone], select option 4, select option 2, ask to apply for  Patient Assistance Program. Meredeth will ask your household income, and how many people  are in your household. They will quote your out-of-pocket cost based on that information.  [employer] will  also be able to set up a 77-month, interest-free payment plan if needed.  Applying the monitor   Shave hair from upper left chest.  Hold abrader disc by orange tab. Rub abrader in 40 strokes over the upper left chest as  indicated in your monitor instructions.  Clean  area with 4 enclosed alcohol pads. Let dry.  Apply patch as indicated in monitor instructions. Patch will be placed under collarbone on left  side of chest with arrow pointing upward.  Rub patch adhesive wings for 2 minutes. Remove white label marked 1. Remove the white  label marked 2. Rub patch adhesive wings for 2 additional minutes.  While looking in a mirror, press and release button in center of patch. A small green light will  flash 3-4 times. This will be your only indicator that the monitor has been turned on.  Do not shower for the first 24 hours. You may shower after the first 24 hours.  Press the button if you feel a symptom. You will hear a small click. Record Date, Time and  Symptom in the Patient Logbook.  When you are ready to remove the patch, follow instructions on the last 2 pages of Patient  Logbook. Stick patch monitor onto the last page of Patient Logbook.  Place Patient Logbook in the blue and white box. Use locking tab on box and tape box closed  securely. The blue and white box has prepaid postage on it. Please place it in the mailbox as  soon as possible. Your physician should have your test results approximately 7 days after the  monitor has been mailed back to [facility].  Call [facility] Customer Care at [phone] if you have questions regarding  your ZIO XT patch monitor. Call them immediately if you see an orange light blinking on your  monitor.  If your monitor falls off in less than 4 days, contact our Monitor department at [phone].  If your monitor becomes loose or falls off after 4 days call [employer] at [phone] for  suggestions on securing your monitor   Follow-Up: At [facility], you and your health needs are our priority.  As part of our continuing mission to provide you with exceptional heart care, our providers are all part of one team.  This team includes your primary Cardiologist (physician) and Advanced Practice  Providers or APPs (Physician Assistants and Nurse Practitioners) who all work together to provide you with the care you need, when you need it.  Your next appointment:   12 month(s)  Provider:   Lynwood Schilling, MD    We recommend signing up for the patient portal called MyChart.  Sign up information is provided on this After Visit Summary.  MyChart is used to connect with patients for Virtual Visits (Telemedicine).  Patients are able to view lab/test results, encounter notes, upcoming appointments, etc.  Non-urgent messages can be sent to your provider as well.   To learn more about what you can do with MyChart, go to [email or web address].   Other Instructions

## 2024-07-22 NOTE — Assessment & Plan Note (Addendum)
-   Denies recent SOB, leg swelling, fatigue, appears euvolemic on exam  - EKG  normal sinus rhythm in office at 92 bpm, unchanged from previous results  - Echo 01/12/2022: LVEF 55-60%, normal LV function, no RWMA, G1 DD present. - Will order repeat echo to monitor chronic combined heart failure and aortic valve at request from patient  - Continue Carvedilol  3.125 mg, 1 tab in AM, 2 tabs in PM.

## 2024-07-22 NOTE — Progress Notes (Signed)
 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  - 2016: dobutamine  echo confirmed EF of 35%, GDMT started.  - 03/05/2017: dobutamine  echo EF 45-50%, no CP, no ST changes, no RWMA or ischemia - Echo 08/14/18: LVEF 45-50%, systolic function mildly reduced, diffuse hypokinesis, G1 DD.  Mild mitral regurg.  Mild tricuspid regurg.  - Echo 01/12/2022: LVEF 55-60%, normal LV function, no RWMA, G1 DD present.  Peak RV-RA 12 mmHg.  RV function and size are normal.  No evidence of mitral regurg.  Normal aortic valve.  Trivial tricuspid regurg.  Hypertension:  - Historical vitals dating from 2025 - 2023 shows BP appears to be well controlled          History of Present Illness:   Deborah [name] is a 58 y.o. female with a hx of anemia, GERD, hyperlipidemia, endometrial CA status post TAH/BSO, chronic systolic and diastolic heart failure, hypertension, presents to the office today for 1 year follow-up appointment.  Has been followed by cardiology for the last 15 years for chronic systolic and diastolic heart failure.  Various echoes over the year showed EF variability from 35% - 60% as shown in the narrative history.  EF has improved following treatment with carvedilol .  Lisinopril  was tried in 2015, patient did not tolerate this, felt lightheaded. Has tolerated other medical therapies with cardiology without issue. Last ischemic workup was dobutamine  echo in 2018 that was negative for ST changes, regional wall motion abnormalities, no ischemia. Has historically had NYHA Class  II-III dyspnea. Last admission to the ED was for DKA with AKI in 2023. Recently seen by family medicine and stopped her atorvastatin  d/t muscle aches. PCP ordered a lipid panel, and would consider starting rosuvastatin  at a lower dose. T2DM managed by her PCP, currently on Ozempic , metformin , A1c on last recheck 6.0.   Deborah [name] presents to the office alone, appears to be doing well.  Denies chest pain, shortness of breath, dizziness, lightheadedness, nausea, vomiting, dark or bloody stools, leg swelling.  Does report new palpitations happening frequently over the last month.  Reports 2-3 instances per week of feeling a fluttering-like sensation in her chest.  States does not last very long, no associated chest pain or shortness of breath with this.  Patient is also very concerned regarding recent family history of her brother and aunt with aortic valve failure requiring emergent replacement.discussed her most recent echo results from 2023 with her, particularly the part about her aortic valve.  She is adamant about receiving an echo to ensure that hers is functioning appropriately.  Reports her blood pressures have been stable at home.  States she recently had lab work performed at her PCPs office.  Reports she is feeling much better than she felt in the past with her episodes of heart failure exacerbation.   ROS:   Please see the history of present illness.    All other systems reviewed and are negative.     Past Medical History:  Diagnosis Date   Anemia    Anxiety  Arthritis    ,Knees, back   Blood in stool 08/28/2012   Congenital birth defect    Right hand   Depression    Educated about COVID-19 virus infection 04/16/2019   Endometrial cancer (HCC)    Far-sighted 08/28/2012   GASTROESOPHAGEAL REFLUX, NO ESOPHAGITIS 01/23/2007   Qualifier: Diagnosis of  By: Deborah [name]     HLD (hyperlipidemia)    borderline   Hx of echocardiogram    Echo (9/15): EF 50-55%, normal wall motion,  grade 2 diastolic dysfunction, mild LAE   Hx of radiation therapy 04/07/13- 05/14/13   pelvis 45 gray 25 fx   Hypertension    Obesity    Swelling    ANKLES - TAKES LASIX    Wrist pain 08/28/2012    Past Surgical History:  Procedure Laterality Date   ABDOMINAL HYSTERECTOMY  10/2011   complete   CHOLECYSTECTOMY N/A 07/08/2015   Procedure: LAPAROSCOPIC CHOLECYSTECTOMY WITH INTRAOPERATIVE CHOLANGIOGRAM;  Surgeon: Deborah Pina, MD;  Location[facility] OR;  Service: General;  Laterality: N/A;   HIATAL HERNIA REPAIR     REPAIR VAGINAL CUFF  10/07/2012   Procedure: REPAIR VAGINAL CUFF;  Surgeon: Deborah Bachelor, MD PHD;  Location: [facility] ORS;  Service: Gynecology;;   ROBOTIC ASSISTED TOTAL HYSTERECTOMY WITH BILATERAL SALPINGO OOPHERECTOMY  10/07/2012   Procedure: ROBOTIC ASSISTED TOTAL HYSTERECTOMY WITH BILATERAL SALPINGO OOPHORECTOMY;  Surgeon: Deborah Bachelor, MD PHD;  Location: [facility] ORS;  Service: Gynecology;  Laterality: Bilateral;    Current Medications: Current Meds  Medication Sig   Accu-Chek Softclix Lancets lancets USE TO CHECK BLOOD SUGAR FOUR TIMES DAILY   acetaminophen  (TYLENOL ) 500 MG tablet Take 1,000 mg by mouth every 6 (six) hours as needed for moderate pain (pain).   blood glucose meter kit and supplies Dispense based on patient and insurance preference. Use up to four times daily as directed. (FOR ICD-10 E10.9, E11.9).   Blood Glucose Monitoring Suppl (ACCU-CHEK GUIDE ME) w/Device KIT Use to check blood sugar 3x per day   carvedilol  (COREG ) 3.125 MG tablet TAKE 1 TABLET BY MOUTH EVERY MORNING AND 2 TABLETS AT BEDTIME   diazepam  (VALIUM ) 5 MG tablet Take 1 tablet (5 mg total) by mouth daily as needed. for anxiety   diclofenac  Sodium (VOLTAREN  ARTHRITIS PAIN) 1 % GEL Apply 2 g topically 4 (four) times daily.   ezetimibe  (ZETIA ) 10 MG tablet Take 1 tablet (10 mg total) by mouth daily.   glucose blood (ACCU-CHEK GUIDE TEST) test strip Please use to check blood sugar up to 4 times per day. E11.9    Insulin  Pen Needle 32G X 4 MM MISC Use as directed.   meclizine  (ANTIVERT ) 25 MG tablet TAKE 1 TO 2 TABLETS BY MOUTH THREE TIMES DAILY AS NEEDED FOR DIZZINESS   metFORMIN  (GLUCOPHAGE -XR) 500 MG 24 hr tablet Take 1 tablet (500 mg total) by mouth daily.   omeprazole  (PRILOSEC) 20 MG capsule TAKE 1 CAPSULE(20 MG) BY MOUTH DAILY AS NEEDED.   OZEMPIC , 0.25 OR 0.5 MG/DOSE, 2 MG/3ML SOPN Inject 0.5 mg into the skin once a week.   polyethylene glycol powder (GLYCOLAX /MIRALAX ) 17 GM/SCOOP powder Take 17 g by mouth daily as needed.   sertraline  (ZOLOFT ) 50 MG tablet Take 1 tablet (50 mg total) by mouth daily.     Allergies:   Atorvastatin  and Lactose intolerance (gi)   Social History   Socioeconomic History   Marital status: Single    Spouse name: Not on file   Number of children: 1   Years of  education: Not on file   Highest education level: Not on file  Occupational History   Occupation: Homemaker  Tobacco Use   Smoking status: Never   Smokeless tobacco: Never  Vaping Use   Vaping status: Never Used  Substance and Sexual Activity   Alcohol use: No    Comment: rare   Drug use: No   Sexual activity: Yes    Birth control/protection: Condom  Other Topics Concern   Not on file  Social History Narrative   Not on file   Social Drivers of Health   Financial Resource Strain: Not on file  Food Insecurity: Not on file  Transportation Needs: Not on file  Physical Activity: Not on file  Stress: Not on file  Social Connections: Not on file     Family History: The patient's family history includes Cancer in her cousin, maternal aunt, and maternal grandfather; Colon cancer in her cousin; Colon polyps in her maternal aunt; Diabetes in her mother; Hypertension in her maternal grandfather and maternal grandmother; Lung cancer in her maternal aunt; Pancreatic cancer in her maternal grandfather; Stroke in her cousin. There is no history of Heart attack.  EKGs/Labs/Other Studies Reviewed:     The following studies were reviewed today:  EKG Interpretation Date/Time:  Wednesday July 22 2024 14:26:41 EDT Ventricular Rate:  92 PR Interval:  128 QRS Duration:  74 QT Interval:  348 QTC Calculation: 430 R Axis:   -11  Text Interpretation: Normal sinus rhythm Normal ECG Confirmed by Consandra Laske [phone]) on 07/22/2024 2:36:07 PM    Recent Labs: 05/07/2024: BUN 18; Creatinine, Ser 0.79; Potassium 3.8; Sodium 138  Recent Lipid Panel    Component Value Date/Time   CHOL 191 11/14/2023 1439   TRIG 135 11/14/2023 1439   HDL 53 11/14/2023 1439   CHOLHDL 3.6 11/14/2023 1439   CHOLHDL 5.1 07/17/2013 0923   VLDL 30 07/17/2013 0923   LDLCALC 114 (H) 11/14/2023 1439   LDLDIRECT 108 (H) 05/07/2024 1502   LDLDIRECT 130 (H) 02/14/2012 1424        STOP-Bang Score:  5       Physical Exam:    VS:  BP 117/80   Pulse 95   Ht 5' 5 (1.651 m)   Wt 296 lb 12.8 oz (134.6 kg)   LMP 09/20/2012   SpO2 98%   BMI 49.39 kg/m        Wt Readings from Last 3 Encounters:  07/22/24 296 lb 12.8 oz (134.6 kg)  05/07/24 293 lb 6.4 oz (133.1 kg)  12/26/23 284 lb (128.8 kg)     GEN:  Well nourished, well developed in no acute distress HEENT: Normal NECK: No carotid bruits CARDIAC:  S1-S2 normal, RRR, no murmurs, rubs, gallops RESPIRATORY:  Clear to auscultation without rales, wheezing or rhonchi  MUSCULOSKELETAL:  No edema; No deformity  SKIN: Warm and dry NEUROLOGIC:  Alert and oriented x 3 PSYCHIATRIC:  Normal affect       Assessment & Plan Chronic combined systolic and diastolic CHF, NYHA class 2 (HCC)  - Denies recent SOB, leg swelling, fatigue, appears euvolemic on exam  - EKG  normal sinus rhythm in office at 92 bpm, unchanged from previous results  - Echo 01/12/2022: LVEF 55-60%, normal LV function, no RWMA, G1 DD present. - Will order repeat echo to monitor chronic combined heart failure and aortic valve at request from patient  - Continue Carvedilol  3.125 mg, 1 tab in  AM, 2 tabs in PM.   Essential  hypertension  - Denies headache, dizziness, near syncope, blurred vision  - Continue Carvedilol  3.125 mg, 1 tab in AM, 2 tabs in PM. Sleep apnea, unspecified type  - STOPBANG: 5  - Positive sleep study in 2019, however never received a CPAP  - Will place order for new sleep study to be performed Palpitations  - Will order 2 week zio monitor to evaluate for arrhythmias and ectopy burden        Medication Adjustments/Labs and Tests Ordered: Current medicines are reviewed at length with the patient today.  Concerns regarding medicines are outlined above.  Orders Placed This Encounter  Procedures   LONG TERM MONITOR XT (3-14 DAYS)   EKG 12-Lead   ECHOCARDIOGRAM COMPLETE   Split night study   No orders of the defined types were placed in this encounter.   Patient Instructions  Medication Instructions:  Your physician recommends that you continue on your current medications as directed. Please refer to the Current Medication list given to you today.  *If you need a refill on your cardiac medications before your next appointment, please call your pharmacy*  Lab Work: None ordered  If you have labs (blood work) drawn today and your tests are completely normal, you will receive your results only by: MyChart Message (if you have MyChart) OR A paper copy in the mail If you have any lab test that is abnormal or we need to change your treatment, we will call you to review the results.  Testing/Procedures: Your physician has requested that you have an echocardiogram. Echocardiography is a painless test that uses sound waves to create images of your heart. It provides your doctor with information about the size and shape of your heart and how well your heart's chambers and valves are working. This procedure takes approximately one hour. There are no restrictions for this procedure. Please do NOT wear cologne, perfume, aftershave, or lotions (deodorant is  allowed). Please arrive 15 minutes prior to your appointment time.  Please note: We ask at that you not bring children with you during ultrasound (echo/ vascular) testing. Due to room size and safety concerns, children are not allowed in the ultrasound rooms during exams. Our front office staff cannot provide observation of children in our lobby area while testing is being conducted. An adult accompanying a patient to their appointment will only be allowed in the ultrasound room at the discretion of the ultrasound technician under special circumstances. We apologize for any inconvenience.  Your physician has recommended that you have a sleep study. This test records several body functions during sleep, including: brain activity, eye movement, oxygen and carbon dioxide blood levels, heart rate and rhythm, breathing rate and rhythm, the flow of air through your mouth and nose, snoring, body muscle movements, and chest and belly movement.   ZIO XT- Long Term Monitor Instructions  Your physician has requested you wear a ZIO patch monitor for 14 days.  This is a single patch monitor. [employer] supplies one patch monitor per enrollment. Additional stickers are not available. Please do not apply patch if you will be having a Nuclear Stress Test,  Echocardiogram, Cardiac CT, MRI, or Chest Xray during the period you would be wearing the  monitor. The patch cannot be worn during these tests. You cannot remove and re-apply the  ZIO XT patch monitor.  Your ZIO patch monitor will be mailed 3 day USPS to your address on file. It may take 3-5 days  to receive your monitor after you  have been enrolled.  Once you have received your monitor, please review the enclosed instructions. Your monitor  has already been registered assigning a specific monitor serial # to you.  Billing and Patient Assistance Program Information  We have supplied [employer] with any of your insurance information on file for billing  purposes. [employer] offers a sliding scale Patient Assistance Program for patients that do not have  insurance, or whose insurance does not completely cover the cost of the ZIO monitor.  You must apply for the Patient Assistance Program to qualify for this discounted rate.  To apply, please call [employer] at [phone], select option 4, select option 2, ask to apply for  Patient Assistance Program. Meredeth will ask your household income, and how many people  are in your household. They will quote your out-of-pocket cost based on that information.  [employer] will also be able to set up a 48-month, interest-free payment plan if needed.  Applying the monitor   Shave hair from upper left chest.  Hold abrader disc by orange tab. Rub abrader in 40 strokes over the upper left chest as  indicated in your monitor instructions.  Clean area with 4 enclosed alcohol pads. Let dry.  Apply patch as indicated in monitor instructions. Patch will be placed under collarbone on left  side of chest with arrow pointing upward.  Rub patch adhesive wings for 2 minutes. Remove white label marked 1. Remove the white  label marked 2. Rub patch adhesive wings for 2 additional minutes.  While looking in a mirror, press and release button in center of patch. A small green light will  flash 3-4 times. This will be your only indicator that the monitor has been turned on.  Do not shower for the first 24 hours. You may shower after the first 24 hours.  Press the button if you feel a symptom. You will hear a small click. Record Date, Time and  Symptom in the Patient Logbook.  When you are ready to remove the patch, follow instructions on the last 2 pages of Patient  Logbook. Stick patch monitor onto the last page of Patient Logbook.  Place Patient Logbook in the blue and white box. Use locking tab on box and tape box closed  securely. The blue and white box has prepaid postage on it. Please place it in the mailbox as  soon  as possible. Your physician should have your test results approximately 7 days after the  monitor has been mailed back to [facility].  Call [facility] Customer Care at [phone] if you have questions regarding  your ZIO XT patch monitor. Call them immediately if you see an orange light blinking on your  monitor.  If your monitor falls off in less than 4 days, contact our Monitor department at [phone].  If your monitor becomes loose or falls off after 4 days call [employer] at [phone] for  suggestions on securing your monitor   Follow-Up: At [facility], you and your health needs are our priority.  As part of our continuing mission to provide you with exceptional heart care, our providers are all part of one team.  This team includes your primary Cardiologist (physician) and Advanced Practice Providers or APPs (Physician Assistants and Nurse Practitioners) who all work together to provide you with the care you need, when you need it.  Your next appointment:   12 month(s)  Provider:   Lynwood Schilling, MD    We recommend signing up for the patient  portal called MyChart.  Sign up information is provided on this After Visit Summary.  MyChart is used to connect with patients for Virtual Visits (Telemedicine).  Patients are able to view lab/test results, encounter notes, upcoming appointments, etc.  Non-urgent messages can be sent to your provider as well.   To learn more about what you can do with MyChart, go to [email or web address].   Other Instructions        Signed, Miriam FORBES Shams, NP  07/22/2024 5:43 PM    [facility]

## 2024-07-22 NOTE — Assessment & Plan Note (Signed)
-   Denies headache, dizziness, near syncope, blurred vision  - Continue Carvedilol  3.125 mg, 1 tab in AM, 2 tabs in PM.

## 2024-07-22 NOTE — Progress Notes (Unsigned)
Enrolled patient for a 14 day Zio XT monitor to be mailed to patients home  Hochrein to read

## 2024-07-22 NOTE — Assessment & Plan Note (Addendum)
-   STOPBANG: 5  - Positive sleep study in 2019, however never received a CPAP  - Will place order for new sleep study to be performed

## 2024-07-23 ENCOUNTER — Encounter: Payer: Self-pay | Admitting: Family Medicine

## 2024-07-24 ENCOUNTER — Ambulatory Visit: Admitting: Cardiology

## 2024-07-31 ENCOUNTER — Other Ambulatory Visit: Payer: Self-pay | Admitting: Family Medicine

## 2024-07-31 NOTE — Telephone Encounter (Signed)
 Chart reviewed. Rx refilled.

## 2024-08-13 ENCOUNTER — Other Ambulatory Visit: Payer: Self-pay | Admitting: Family Medicine

## 2024-08-13 DIAGNOSIS — F32A Depression, unspecified: Secondary | ICD-10-CM

## 2024-08-21 ENCOUNTER — Telehealth: Payer: Self-pay | Admitting: Cardiology

## 2024-08-21 DIAGNOSIS — I10 Essential (primary) hypertension: Secondary | ICD-10-CM

## 2024-08-21 MED ORDER — CARVEDILOL 3.125 MG PO TABS: ORAL_TABLET | ORAL | 1 refills | Status: DC

## 2024-08-21 NOTE — Telephone Encounter (Signed)
*  STAT* If patient is at the pharmacy, call can be transferred to refill team.   1. Which medications need to be refilled? (please list name of each medication and dose if known)   carvedilol  (COREG ) 3.125 MG tablet   2. Would you like to learn more about the convenience, safety, & potential cost savings by using the [facility]?   3. Are you open to using the [facility] (Type [facility]. ).  4. Which pharmacy/location (including street and city if local pharmacy) is medication to be sent to?  [id number] - [place], [place] - [address]   5. Do they need a 30 day or 90 day supply? 90 day  Patient stated she is almost out of this medication.

## 2024-08-21 NOTE — Telephone Encounter (Signed)
 Called and left patient a voicemail to call back in reference to medications. Patients desired medication was refilled and sent to desired pharmacy

## 2024-08-26 ENCOUNTER — Other Ambulatory Visit: Payer: Self-pay | Admitting: Family Medicine

## 2024-08-26 DIAGNOSIS — H905 Unspecified sensorineural hearing loss: Secondary | ICD-10-CM

## 2024-08-27 ENCOUNTER — Other Ambulatory Visit: Payer: Self-pay | Admitting: Family Medicine

## 2024-08-27 NOTE — Telephone Encounter (Signed)
 Patient calls nurse line regarding refill on Meclizine .   Per chart review, refill was denied (refill not appropriate).   Please advise if patient needs to schedule follow up visit in order to receive refill.   Chiquita JAYSON English, RN

## 2024-08-28 NOTE — Telephone Encounter (Signed)
 Patient returns call to nurse line regarding refill. Patient also states that she missed a call from our office yesterday, however, I am unsure who was trying to reach her and if it was regarding this refill.   Please advise.   Chiquita JAYSON English, RN

## 2024-08-31 ENCOUNTER — Telehealth: Payer: Self-pay | Admitting: Pharmacist

## 2024-08-31 NOTE — Telephone Encounter (Signed)
 Patient returns call to nurse line regarding Meclizine  refill.   She reports that she needs this to help with her vertigo and is unsure why this has been denied.   Advised her that I will send message to PCP for follow up.   Chiquita JAYSON English, RN

## 2024-08-31 NOTE — Telephone Encounter (Signed)
 Reviewed and agree with Dr Rennis plan.

## 2024-08-31 NOTE — Telephone Encounter (Signed)
 Patient contacted for follow-up of request for Ozempic  (semaglutide ) refill  Since last contact patient reports she is doing well.  She has plans to return to office for visit.   Shared that I had refilled her Ozempic  (semaglutide ) and she was happy to have this completed.   Total time with patient call and documentation of interaction: 9 minutes.  F/U visit later this month.

## 2024-08-31 NOTE — Telephone Encounter (Signed)
 Patient returns call to nurse line regarding refill on Ozempic .   Advised that this has been sent to PCP. Patient requests that this is sent to Dr. Koval as he has been prescribing this for her in the past.   She reports that she is due for injection today and is out of medication. Requesting refill as soon as possible.   Message sent to Dr. Amalia.   Chiquita JAYSON English, RN

## 2024-09-02 ENCOUNTER — Other Ambulatory Visit: Payer: Self-pay | Admitting: Family Medicine

## 2024-09-02 DIAGNOSIS — H905 Unspecified sensorineural hearing loss: Secondary | ICD-10-CM

## 2024-09-02 MED ORDER — MECLIZINE HCL 25 MG PO TABS: ORAL_TABLET | ORAL | 0 refills | Status: DC

## 2024-09-02 NOTE — Telephone Encounter (Signed)
 Attempted to call patient to provider her with message per Dr. Lonnie.   LVM requesting that she return call to office.    Patient returned call to nurse line. Patient reports that Dr. Paige advised her to take 2 at a time.   She usually takes 4/day, on some days if vertigo is bad she will take up to 6 per day.   Due to amount that she is taking per day, she is out of medication.   Please advise.   Chiquita JAYSON English, RN

## 2024-09-10 ENCOUNTER — Ambulatory Visit (HOSPITAL_COMMUNITY)
Admission: RE | Admit: 2024-09-10 | Discharge: 2024-09-10 | Disposition: A | Discharge status: Home / Self Care | Source: Ambulatory Visit | Attending: Cardiology | Admitting: Cardiology

## 2024-09-10 DIAGNOSIS — G4730 Sleep apnea, unspecified: Secondary | ICD-10-CM

## 2024-09-10 DIAGNOSIS — I5042 Chronic combined systolic (congestive) and diastolic (congestive) heart failure: Secondary | ICD-10-CM | POA: Diagnosis not present

## 2024-09-10 DIAGNOSIS — R002 Palpitations: Secondary | ICD-10-CM

## 2024-09-10 DIAGNOSIS — I10 Essential (primary) hypertension: Secondary | ICD-10-CM | POA: Diagnosis not present

## 2024-09-10 LAB — ECHOCARDIOGRAM COMPLETE
Area-P 1/2: 5.2 cm2
S' Lateral: 4.1 cm

## 2024-09-11 ENCOUNTER — Ambulatory Visit: Payer: Self-pay | Admitting: Emergency Medicine

## 2024-09-11 ENCOUNTER — Other Ambulatory Visit: Payer: Self-pay | Admitting: Family Medicine

## 2024-09-11 DIAGNOSIS — F32A Depression, unspecified: Secondary | ICD-10-CM

## 2024-09-14 NOTE — Telephone Encounter (Signed)
Pt returning call for results. Please advise.  

## 2024-09-15 NOTE — Telephone Encounter (Signed)
 Patient calls nurse line in regards to her medication refill.   Discussed at length that she would need an apt.  Patient agreed to virtual apt on 10/23.  If connecting is an issue, please call her.

## 2024-09-15 NOTE — Telephone Encounter (Signed)
Pt returned nurse call.

## 2024-09-16 ENCOUNTER — Telehealth: Payer: Self-pay | Admitting: Cardiology

## 2024-09-16 DIAGNOSIS — I10 Essential (primary) hypertension: Secondary | ICD-10-CM

## 2024-09-16 MED ORDER — CARVEDILOL 3.125 MG PO TABS: ORAL_TABLET | ORAL | 2 refills | Status: AC

## 2024-09-16 NOTE — Telephone Encounter (Signed)
*  STAT* If patient is at the pharmacy, call can be transferred to refill team.   1. Which medications need to be refilled? (please list name of each medication and dose if known)   carvedilol  (COREG ) 3.125 MG tablet   2. Would you like to learn more about the convenience, safety, & potential cost savings by using the [facility]?   3. Are you open to using the [facility] (Type [facility]. ).  4. Which pharmacy/location (including street and city if local pharmacy) is medication to be sent to?  [id number] - [place], [place] - [address] AT [facility] [address]   5. Do they need a 30 day or 90 day supply?   90 day   Patient stated she has a couple of tablets left.

## 2024-09-16 NOTE — Telephone Encounter (Signed)
 Pt's medication was sent to pt's pharmacy as requested. Confirmation received.

## 2024-09-17 ENCOUNTER — Telehealth (INDEPENDENT_AMBULATORY_CARE_PROVIDER_SITE_OTHER): Admitting: Family Medicine

## 2024-09-17 DIAGNOSIS — H905 Unspecified sensorineural hearing loss: Secondary | ICD-10-CM

## 2024-09-17 DIAGNOSIS — F32A Depression, unspecified: Secondary | ICD-10-CM

## 2024-09-17 DIAGNOSIS — F419 Anxiety disorder, unspecified: Secondary | ICD-10-CM | POA: Diagnosis not present

## 2024-09-17 MED ORDER — SERTRALINE HCL 50 MG PO TABS: 50.0000 mg | ORAL_TABLET | Freq: Every day | ORAL | 0 refills | Status: DC

## 2024-09-17 MED ORDER — DIAZEPAM 5 MG PO TABS: 5.0000 mg | ORAL_TABLET | Freq: Every day | ORAL | 0 refills | Status: DC | PRN

## 2024-09-17 NOTE — Progress Notes (Signed)
 [facility] Telemedicine Visit  Patient consented to have virtual visit and was identified by name and date of birth. Method of visit: Video  Encounter participants: Patient: Deborah [name] - located at home Provider: Gloriann Ogren - located at [facility]   Chief Complaint: Anxiety  HPI:  Reports her anxiety is a bit better than last time. Is taking valium  about once a day. Feels this just helps with her anxiety and has been on this for a while. She also reports she is taking the sertraline  prescribed at the last visit. Denies any thoughts of suicide or self harm. Reports anxiety feels stable as compared to prior.   ROS: per HPI  Pertinent PMHx: DMII, Sleep apnea, HTN  Exam:  LMP 09/20/2012   Respiratory: talking in full sentences without distress   Assessment/Plan:  Assessment & Plan Anxiety and depression Reordered valium  5mg  - 30 tablets. Also refilled sertraline . Of note, at last visit, 30 tablets of sertraline  were ordered without refill. This was in August. Patient did report she had some left over medication from prior use. Unsure if patient is adherent with medication. Discussed importance of combo therapy and target to ultimately cut down valium  use. Will follow up with patient in 3 months in office.    Time spent during visit with patient: 20 minutes

## 2024-09-17 NOTE — Patient Instructions (Addendum)
 It was wonderful to see you today.  Please bring ALL of your medications with you to every visit.   Today we talked about:  Your anxiety, it was great to talk with you. We refilled your medication. Lets plan a 3 month follow up  Thank you for choosing [facility].   Please call [phone] with any questions about today's appointment.  Please arrive at least 15 minutes prior to your scheduled appointments.   If you had blood work today, I will send you a MyChart message or a letter if results are normal. Otherwise, I will give you a call.   If you had a referral placed, they will call you to set up an appointment. Please give us  a call if you don't hear back in the next 2 weeks.   If you need additional refills before your next appointment, please call your pharmacy first.   Do you need your medications delivered to your home?   We'll send your prescription to the [facility] for delivery.          Address: [address] [place], [place], [place] [address]          Phone: [phone]  Please call the [facility] to speak with a pharmacist and set up your home medication delivery. If you have any questions, feel free to contact us  -- we're happy to help!  Other [facility]acies that offer affordable prices on both prescriptions and over-the-counter items, as well as convenient services like vaccinations, are  [facility], at [facility]         Address:  [address], [place], [place] [address]         Phone: [phone]  [facility], located in the [facility]        Address: [address] [place], [place] [address]        Phone: [phone]  [facility], at [facility]       Address: [address], [place], [place] [address]       Phone: [phone]  [facility], at [facility]       Address: [address], [place], [place] [address]       Phone: [phone]  You should follow up in our clinic in [place] in about 3 months (around 12/18/2024).  Gloriann Ogren, MD Family Medicine

## 2024-09-17 NOTE — Assessment & Plan Note (Signed)
 Reordered valium  5mg  - 30 tablets. Also refilled sertraline . Of note, at last visit, 30 tablets of sertraline  were ordered without refill. This was in August. Patient did report she had some left over medication from prior use. Unsure if patient is adherent with medication. Discussed importance of combo therapy and target to ultimately cut down valium  use. Will follow up with patient in 3 months in office.

## 2024-09-24 ENCOUNTER — Encounter: Payer: Self-pay | Admitting: Pharmacist

## 2024-09-24 ENCOUNTER — Ambulatory Visit: Admitting: Pharmacist

## 2024-09-24 VITALS — BP 100/70 | HR 95 | Wt 300.0 lb

## 2024-09-24 DIAGNOSIS — E119 Type 2 diabetes mellitus without complications: Secondary | ICD-10-CM | POA: Diagnosis not present

## 2024-09-24 DIAGNOSIS — I10 Essential (primary) hypertension: Secondary | ICD-10-CM | POA: Diagnosis not present

## 2024-09-24 LAB — POCT GLYCOSYLATED HEMOGLOBIN (HGB A1C): HbA1c, POC (controlled diabetic range): 6.5 % (ref 0.0–7.0)

## 2024-09-24 MED ORDER — METFORMIN HCL ER 500 MG PO TB24
1000.0000 mg | ORAL_TABLET | Freq: Every day | ORAL | 3 refills | Status: AC
Start: 2024-09-24 — End: ?

## 2024-09-24 NOTE — Progress Notes (Signed)
 S:     Chief Complaint  Patient presents with   Medication Management    Diabetes follow up   58 y.o. female who presents for diabetes evaluation, education, and management. Patient arrives in  good spirits and presents without  any assistance.   Patient was referred and last seen by Primary Care Provider, Dr. Lonnie, on 05/07/2024.  At last visit, patient was referred for diabetes management.   PMH is significant for T2DM, Osteoarthritis, hypertension, anxiety and depression Patient reports Diabetes was diagnosed in 2023.   Current diabetes medications include: Metformin  XR 500 mg daily, Ozempic  (semaglutide ) 0.5 mg dialed back 5 units.   Current hyperlipidemia medications include: Ezetimibe  10 mg daily  Patient reports adherence to taking all medications as prescribed.   Do you feel that your medications are working for you? yes Have you been experiencing any side effects to the medications prescribed? Yes, some nausea and GI intolerance at the beginning of the week. Do you have any problems obtaining medications due to transportation or finances? no Insurance coverage: [place]  Patient denies hypoglycemic events.  Reported home fasting blood sugars: 120-130s   Patient denies nocturia (nighttime urination).  Patient denies neuropathy (nerve pain). Patient denies visual changes. Patient reports self foot exams.   Patient reported dietary habits: Eats 3 meals/day Breakfast: Oatmeal, apples, yogurt with granola, grits, turkey bacon and sausage, bananas Lunch/Dinner: baked chicken, fish, baked tenderloins, salad, green beans, peppers and onions Snacks: No sugar peaches, pears, yogurt, fruits  Drinks: Pepsi zero, diet Dr. nunzio  Patient-reported exercise habits: Walk around apartment 3-4 times a week   O:   Review of Systems  All other systems reviewed and are negative.   Physical Exam Vitals reviewed.  Constitutional:      Appearance: Normal appearance.   Neurological:     Mental Status: She is alert.  Psychiatric:        Mood and Affect: Mood normal.        Behavior: Behavior normal.        Thought Content: Thought content normal.        Judgment: Judgment normal.     Lab Results  Component Value Date   HGBA1C 6.5 09/24/2024   Vitals:   09/24/24 1401  BP: 100/70  Pulse: 95  SpO2: 99%    Lipid Panel     Component Value Date/Time   CHOL 191 11/14/2023 1439   TRIG 135 11/14/2023 1439   HDL 53 11/14/2023 1439   CHOLHDL 3.6 11/14/2023 1439   CHOLHDL 5.1 07/17/2013 0923   VLDL 30 07/17/2013 0923   LDLCALC 114 (H) 11/14/2023 1439   LDLDIRECT 108 (H) 05/07/2024 1502   LDLDIRECT 130 (H) 02/14/2012 1424    Clinical Atherosclerotic Cardiovascular Disease (ASCVD): No  The 10-year ASCVD risk score (Arnett DK, et al., 2019) is: 5.4%   Values used to calculate the score:     Age: 78 years     Clincally relevant sex: Female     Is Non-Hispanic African American: Yes     Diabetic: Yes     Tobacco smoker: No     Systolic Blood Pressure: 100 mmHg     Is BP treated: Yes     HDL Cholesterol: 53 mg/dL     Total Cholesterol: 191 mg/dL   Patient is participating in a Managed Medicaid Plan:  Yes   A/P: Diabetes longstanding since 2023  currently controlled with A1C today of 6.5. Patient is able to verbalize appropriate  hypoglycemia management plan. Medication adherence appears good.  -Decreased dose of GLP-1 Ozempic  (semaglutide ) 0.5 mg dialed 8 clicks instead of 5 -Increased dose of metformin  XR 500mg  from 1 tablet once daily to 2 tablets daily (1000 mg) -Patient educated on purpose, proper use, and potential adverse effects.  -Extensively discussed pathophysiology of diabetes, recommended lifestyle interventions, dietary effects on blood sugar control.  -Counseled on s/sx of and management of hypoglycemia.  -Obtained A1C today of 6.5  ASCVD risk - primary prevention in patient with diabetes. Last LDL is 108 not at goal of <29   mg/dL. ASCVD risk factors include diabetes. Patient is unable to tolerate low doses of statins.  - Continue Ezetimibe  10 mg  Written patient instructions provided. Patient verbalized understanding of treatment plan.  Total time in face to face counseling 34 minutes.    Follow-up:  No follow up appointment made, can schedule as needed. Patient seen with Lawson Mao, PharmD Candidate - PY3 student and Belvie Macintosh, PharmD - PY4 Candidate.

## 2024-09-24 NOTE — Patient Instructions (Signed)
 It was nice to see you today! Great job with your diabetes control and lifestyle changes with your meals and exercise. Your goal blood sugar is 80-130 before eating and less than 180 after eating.  Medication Changes: Increase Metformin  XR 500 mg 2 tablets once daily  Take Ozempic  (semaglutide ) 0.5 mg and dial 8 clicks back instead of 5  Continue all other medication the same.   Monitor blood sugars at home and keep a log (glucometer or piece of paper) to bring with you to your next visit.  Keep up the good work with diet and exercise. Aim for a diet full of vegetables, fruit and lean meats (chicken, turkey, fish). Try to limit salt intake by eating fresh or frozen vegetables (instead of canned), rinse canned vegetables prior to cooking and do not add any additional salt to meals.

## 2024-09-24 NOTE — Addendum Note (Signed)
 Addended by: Steffon Gladu L on: 09/24/2024 02:47 PM   Modules accepted: Orders

## 2024-09-24 NOTE — Assessment & Plan Note (Signed)
 Diabetes longstanding since 2023  currently controlled with A1C today of 6.5. Patient is able to verbalize appropriate hypoglycemia management plan. Medication adherence appears good.  -Decreased dose of GLP-1 Ozempic  (semaglutide ) 0.5 mg dialed 8 clicks instead of 5 -Increased dose of metformin  XR 500mg  from 1 tablet once daily to 2 tablets daily (1000 mg) -Patient educated on purpose, proper use, and potential adverse effects.  -Extensively discussed pathophysiology of diabetes, recommended lifestyle interventions, dietary effects on blood sugar control.  -Counseled on s/sx of and management of hypoglycemia.  -Obtained A1C today of 6.5

## 2024-09-25 NOTE — Progress Notes (Signed)
 Reviewed and agree with Dr Rennis plan.

## 2024-10-15 ENCOUNTER — Other Ambulatory Visit: Payer: Self-pay | Admitting: Family Medicine

## 2024-10-15 DIAGNOSIS — H905 Unspecified sensorineural hearing loss: Secondary | ICD-10-CM

## 2024-10-15 DIAGNOSIS — F32A Depression, unspecified: Secondary | ICD-10-CM

## 2024-10-20 ENCOUNTER — Other Ambulatory Visit: Payer: Self-pay | Admitting: Family Medicine

## 2024-10-20 DIAGNOSIS — F419 Anxiety disorder, unspecified: Secondary | ICD-10-CM

## 2024-10-21 ENCOUNTER — Telehealth: Payer: Self-pay

## 2024-10-21 DIAGNOSIS — F32A Depression, unspecified: Secondary | ICD-10-CM

## 2024-10-21 MED ORDER — DIAZEPAM 5 MG PO TABS: ORAL_TABLET | ORAL | 0 refills | Status: DC

## 2024-10-21 NOTE — Telephone Encounter (Signed)
 Called patient.   Advised of new script to the pharmacy.   Patient was appreciative.

## 2024-10-21 NOTE — Telephone Encounter (Signed)
 Valium  refilled, PDMP reviewed and appropriate.  Rollene Keeling MD

## 2024-10-21 NOTE — Telephone Encounter (Signed)
 Patient calls nurse line in regards to Diazepam  prescription.   She reports she went to pick this up, however was unable to get prescription.   I called the pharmacy. They report DEA for PCP is not working with patients insurance. Verified DEA on file for [place], however the prescription was still being kicked back.   Will forward to morning preceptor.

## 2024-10-30 ENCOUNTER — Other Ambulatory Visit: Payer: Self-pay | Admitting: Family Medicine

## 2024-11-02 ENCOUNTER — Telehealth: Payer: Self-pay | Admitting: *Deleted

## 2024-11-02 ENCOUNTER — Other Ambulatory Visit: Payer: Self-pay | Admitting: Family Medicine

## 2024-11-02 NOTE — Telephone Encounter (Signed)
 Spoke with Deborah [name] who called to reschedule her Wednesday, 12/10 appt. With Dr. Rogelio. Pt was given a new appt. For Tuesday, 12/23 at 2:15pm. Pt aware to arrive at 2 pm for check in.

## 2024-11-04 ENCOUNTER — Inpatient Hospital Stay: Admitting: Obstetrics & Gynecology

## 2024-11-17 ENCOUNTER — Inpatient Hospital Stay: Admitting: Obstetrics & Gynecology

## 2024-11-17 ENCOUNTER — Inpatient Hospital Stay: Attending: Obstetrics & Gynecology | Admitting: Obstetrics & Gynecology

## 2024-11-17 VITALS — BP 117/75 | HR 94 | Temp 99.3°F | Resp 20 | Wt 302.6 lb

## 2024-11-17 DIAGNOSIS — Z923 Personal history of irradiation: Secondary | ICD-10-CM | POA: Diagnosis not present

## 2024-11-17 DIAGNOSIS — Z90710 Acquired absence of both cervix and uterus: Secondary | ICD-10-CM | POA: Insufficient documentation

## 2024-11-17 DIAGNOSIS — Z9079 Acquired absence of other genital organ(s): Secondary | ICD-10-CM | POA: Insufficient documentation

## 2024-11-17 DIAGNOSIS — Z08 Encounter for follow-up examination after completed treatment for malignant neoplasm: Secondary | ICD-10-CM | POA: Diagnosis present

## 2024-11-17 DIAGNOSIS — Z9221 Personal history of antineoplastic chemotherapy: Secondary | ICD-10-CM | POA: Diagnosis not present

## 2024-11-17 DIAGNOSIS — Z90722 Acquired absence of ovaries, bilateral: Secondary | ICD-10-CM | POA: Insufficient documentation

## 2024-11-17 DIAGNOSIS — Z8542 Personal history of malignant neoplasm of other parts of uterus: Secondary | ICD-10-CM | POA: Diagnosis not present

## 2024-11-17 NOTE — Progress Notes (Signed)
 "                                                                                    Follow Up Note: Gyn-Onc   Deborah [name] 58 y.o. 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    History of endometrial cancer   10/07/2012 Cancer Staging     Cancer Staging History of endometrial cancer Staging form: Corpus Uteri - Carcinoma, AJCC 7th Edition - Clinical: Stage Unknown (T1a, NX, M0) - Unsigned - Pathologic: Stage Unknown (T1a, NX, M0, Free text: IIIC) - Unsigned       10/08/2012 Initial Diagnosis     Endometrial cancer     11/25/2012 Imaging     CT: Shotty bilateral external iliac lymphadenopathy measuring up to 1.3 cm, suspicious for metastatic disease. Nonspecific less than 5 mm retroperitoneal lymph nodes also noted in the left para-aortic region. 2. 2.7 cm postop lymphocele versus low attenuation lymphadenopathy in the proximal left external iliac chain. 3. Cholelithiasis and right nephrolithiasis incidentally noted.     04/07/2013 - 05/25/2013 Radiation Therapy     XRT     08/25/2013 Imaging     CT: no evidence of abdominal lymphadenopathy. There Is no evidence of inflammatory process abscess or ascites the adnexal regions are unremarkable in appearance there is no new or progressive disease within the abdomen or pelvis      - 07/24/2013 Chemotherapy     The patient had 6 cycles of Taxol /Carboplatin  and pelvic XRT sandwich fashion           Interval History: She denies any vaginal bleeding, abdominal/pelvic pain, cough, lethargy or abdominal distention.       Review of Systems  Review of Systems Constitutional: Negative for malaise/fatigue and weight loss. Respiratory: Negative for cough.   Gastrointestinal: Negative for abdominal pain. Genitourinary:        No vaginal bleeding  Psychiatric/Behavioral: Negative.       Current Meds:      Outpatient Encounter Medications as of 06/01/2020  Medication Sig   acetaminophen  (TYLENOL ) 500 MG tablet Take 1,000 mg by mouth every 6 (six) hours as needed for moderate pain (pain).   bifidobacterium infantis (ALIGN) capsule Take 1 capsule by mouth daily as needed (for regularity). Reported on 02/23/2016   carvedilol  (COREG ) 3.125 MG tablet Take one (1) tablet (3.125 mg) by mouth each morning. Take two (2) tablets (6.25 mg) by mouth each evening.   diazepam  (VALIUM ) 5 MG tablet Take 1 tablet (5 mg total) by mouth every 12 (twelve) hours as needed for anxiety.   diclofenac  sodium (VOLTAREN ) 1 % GEL Apply 4 g topically 4 (four) times daily.   fluticasone  (FLONASE ) 50 MCG/ACT nasal spray Place 2 sprays into both nostrils daily.   furosemide  (LASIX ) 20 MG tablet Take 1 tablet (20 mg total) by mouth daily as needed for fluid.   ibuprofen  (ADVIL ,MOTRIN ) 800 MG tablet Take 1 tablet (  800 mg total) by mouth every 8 (eight) hours as needed.   lactase (LACTAID) 3000 UNITS tablet Take 1 tablet by mouth 3 (three) times daily as needed (for dairy intake). Reported on 02/23/2016   loperamide (IMODIUM) 2 MG capsule Take 2 mg by mouth 4 (four) times daily as needed for diarrhea or loose stools (diarrhea). Reported on 02/23/2016   magnesium  chloride (SLOW-MAG) 64 MG TBEC SR tablet Take 1-2 tablets by mouth 2 (two) times daily. Takes two in the morning and one in the evening   omeprazole  (PRILOSEC) 20 MG capsule TAKE 1 CAPSULE(20 MG) BY MOUTH DAILY   polyethylene glycol powder (GLYCOLAX /MIRALAX ) powder Take 17 g by mouth daily as needed.   potassium chloride  (K-DUR,KLOR-CON ) 10 MEQ tablet Take 2 tablets (20 mEq total) by mouth 2 (two) times daily.   sertraline  (ZOLOFT ) 100 MG tablet Take 1 tablet (100 mg total) by mouth every evening.   sertraline  (ZOLOFT ) 50 MG tablet Take 1 tablet (50 mg total) by mouth daily. Start with 1/2 tablet  for a few days.   tiZANidine  (ZANAFLEX ) 4 MG tablet Take 1 tablet (4 mg total) by mouth every 6 (six) hours as needed for muscle spasms.      No facility-administered encounter medications on file as of 06/01/2020.        Allergy:  Allergies      Allergies  Allergen Reactions   Lactose Intolerance (Gi) Diarrhea        Social Hx:   Social History         Socioeconomic History   Marital status: Single      Spouse name: Not on file   Number of children: 1   Years of education: Not on file   Highest education level: Not on file  Occupational History   Occupation: Homemaker  Tobacco Use   Smoking status: Never Smoker   Smokeless tobacco: Never Used  Building Services Engineer Use: Never used  Substance and Sexual Activity   Alcohol use: No      Comment: rare   Drug use: No   Sexual activity: Yes      Birth control/protection: Condom  Other Topics Concern   Not on file  Social History Narrative   Not on file    Social Determinants of Health       Financial Resource Strain:    Difficulty of Paying Living Expenses:   Food Insecurity:    Worried About Programme Researcher, Broadcasting/film/video in the Last Year:    Barista in the Last Year:   Transportation Needs:    Freight Forwarder (Medical):    Lack of Transportation (Non-Medical):   Physical Activity:    Days of Exercise per Week:    Minutes of Exercise per Session:   Stress:    Feeling of Stress :   Social Connections:    Frequency of Communication with Friends and Family:    Frequency of Social Gatherings with Friends and Family:    Attends Religious Services:    Active Member of Clubs or Organizations:    Attends Banker Meetings:    Marital Status:   Intimate Partner Violence:    Fear of Current or Ex-Partner:    Emotionally Abused:    Physically Abused:    Sexually Abused:       Past Surgical Hx:       Past Surgical History:  Procedure Laterality Date   ABDOMINAL HYSTERECTOMY   10/2011  complete   CHOLECYSTECTOMY N/A 07/08/2015    Procedure: LAPAROSCOPIC CHOLECYSTECTOMY WITH INTRAOPERATIVE CHOLANGIOGRAM;  Surgeon: Lynwood Pina, MD;  Location: [facility] OR;  Service: General;  Laterality: N/A;   HIATAL HERNIA REPAIR       REPAIR VAGINAL CUFF   10/07/2012    Procedure: REPAIR VAGINAL CUFF;  Surgeon: Sari Bachelor, MD PHD;  Location: [facility] ORS;  Service: Gynecology;;   ROBOTIC ASSISTED TOTAL HYSTERECTOMY WITH BILATERAL SALPINGO OOPHERECTOMY   10/07/2012    Procedure: ROBOTIC ASSISTED TOTAL HYSTERECTOMY WITH BILATERAL SALPINGO OOPHORECTOMY;  Surgeon: Sari Bachelor, MD PHD;  Location: [facility] ORS;  Service: Gynecology;  Laterality: Bilateral;          Past Medical Hx:      Past Medical History:  Diagnosis Date   Anemia     Anxiety     Arthritis      ,Knees, back   Blood in stool 08/28/2012   Congenital birth defect      Right hand   Depression     Educated about COVID-19 virus infection 04/16/2019   Endometrial cancer (HCC)     HLD (hyperlipidemia)      borderline   Hx of echocardiogram      Echo (9/15): EF 50-55%, normal wall motion, grade 2 diastolic dysfunction, mild LAE   Hx of radiation therapy 04/07/13- 05/14/13    pelvis 45 gray 25 fx   Hypertension     Obesity     Swelling      ANKLES - TAKES LASIX    Wrist pain 08/28/2012          Family Hx:       Family History  Problem Relation Age of Onset   Diabetes Mother     Pancreatic cancer Maternal Grandfather          or liver cancer   Hypertension Maternal Grandfather     Cancer Maternal Grandfather          pancreatic   Hypertension Maternal Grandmother     Colon polyps Maternal Aunt     Cancer Maternal Aunt          lung   Colon cancer Cousin     Cancer Cousin          lung   Lung cancer Maternal Aunt     Stroke Cousin     Heart attack Neg Hx            Vitals: BP 117/75 (BP Location: Right Arm, Patient Position: Sitting)   Pulse 94   Temp 99.3 F (37.4 C) (Oral)   Resp 20   Wt (!) 302 lb 9.6 oz  (137.3 kg)   LMP 09/20/2012   SpO2 98%   BMI 50.36 kg/m   Physical Exam: Physical Exam Exam conducted with a chaperone present.  Constitutional:      Appearance: She is obese. She is not ill-appearing.  Abdominal:     General: There is no distension.     Palpations: Abdomen is soft. There is no mass.     Tenderness: There is no abdominal tenderness.  Genitourinary:    General: Normal vulva.     Exam position: Lithotomy position.     Vagina: Normal. No lesions. Cystocele present    Uterus: Absent.      Comments: Cervix absent.   Lymphadenopathy:     Lower Body: No right inguinal adenopathy. No left inguinal adenopathy.  Skin:    General: Skin is warm and dry.  Neurological:  General: No focal deficit present.  Psychiatric:        Mood and Affect: Mood normal.        Assessment/Plan:  History of endometrial cancer H/O grade 2 endometrial adenocarcinoma Negative symptoms review, normal exam.  No evidence of recurrence  >Annual f/u     I personally spent 25 minutes face-to-face and non-face-to-face in the care of this patient, which includes all pre, intra, and post visit time on the date of service.     Olam Mill, MD   "

## 2024-11-17 NOTE — Patient Instructions (Signed)
" °  VISIT SUMMARY: Today, you came in for a wellness visit and discussed your back pain, osteoarthritis, history of endometrial cancer, and type 2 diabetes management. We also reviewed your family history of cancer and other health issues.  YOUR PLAN: -WOMAN'S WELLNESS VISIT: Given your history of endometrial cancer and family history of various cancers, it is important to continue with your annual wellness visits to monitor your overall health.  -HISTORY OF ENDOMETRIAL CANCER: Due to your history of endometrial cancer and your family's cancer history, regular follow-up visits are crucial to ensure any potential issues are caught early. Please continue with your annual follow-up visits.  -TYPE 2 DIABETES MELLITUS: Your type 2 diabetes is well-managed. However, losing weight can further help in managing your diabetes. Please continue with your current diabetes management plan and focus on weight loss efforts.  INSTRUCTIONS: Please continue with your annual wellness and follow-up visits. Maintain your current diabetes management plan and work on weight loss to aid in managing your diabetes.                      Contains text generated by Abridge.                                 Contains text generated by Abridge.   "

## 2024-11-17 NOTE — Assessment & Plan Note (Addendum)
 H/O grade 2 endometrial adenocarcinoma Negative symptoms review, normal exam.  No evidence of recurrence  >Annual f/u

## 2024-11-18 ENCOUNTER — Encounter: Payer: Self-pay | Admitting: Obstetrics & Gynecology

## 2024-12-01 ENCOUNTER — Other Ambulatory Visit: Payer: Self-pay

## 2024-12-01 DIAGNOSIS — H905 Unspecified sensorineural hearing loss: Secondary | ICD-10-CM

## 2024-12-01 DIAGNOSIS — F32A Depression, unspecified: Secondary | ICD-10-CM

## 2024-12-03 MED ORDER — MECLIZINE HCL 25 MG PO TABS: ORAL_TABLET | ORAL | 0 refills | Status: AC

## 2024-12-03 MED ORDER — DIAZEPAM 5 MG PO TABS: ORAL_TABLET | ORAL | 0 refills | Status: AC

## 2024-12-22 ENCOUNTER — Ambulatory Visit: Payer: Self-pay | Admitting: Family Medicine

## 2025-01-19 ENCOUNTER — Ambulatory Visit: Admitting: Family Medicine

## 2025-01-19 ENCOUNTER — Ambulatory Visit: Admitting: Pharmacist

## 2025-01-19 ENCOUNTER — Ambulatory Visit: Payer: Self-pay | Admitting: Family Medicine
